# Patient Record
Sex: Male | Born: 1955 | Race: White | Hispanic: No | Marital: Married | Smoking: Current some day smoker
Health system: Southern US, Community
[De-identification: ages and names within clinical notes are randomized; demographics above are authoritative.]

## PROBLEM LIST (undated history)

## (undated) DIAGNOSIS — G8929 Other chronic pain: Secondary | ICD-10-CM

## (undated) DIAGNOSIS — E119 Type 2 diabetes mellitus without complications: Secondary | ICD-10-CM

## (undated) DIAGNOSIS — R0602 Shortness of breath: Secondary | ICD-10-CM

## (undated) DIAGNOSIS — M549 Dorsalgia, unspecified: Secondary | ICD-10-CM

## (undated) DIAGNOSIS — E785 Hyperlipidemia, unspecified: Secondary | ICD-10-CM

## (undated) DIAGNOSIS — Z86718 Personal history of other venous thrombosis and embolism: Secondary | ICD-10-CM

## (undated) DIAGNOSIS — M1990 Unspecified osteoarthritis, unspecified site: Secondary | ICD-10-CM

## (undated) DIAGNOSIS — R7611 Nonspecific reaction to tuberculin skin test without active tuberculosis: Secondary | ICD-10-CM

## (undated) DIAGNOSIS — I809 Phlebitis and thrombophlebitis of unspecified site: Secondary | ICD-10-CM

## (undated) DIAGNOSIS — I739 Peripheral vascular disease, unspecified: Secondary | ICD-10-CM

## (undated) DIAGNOSIS — G709 Myoneural disorder, unspecified: Secondary | ICD-10-CM

## (undated) DIAGNOSIS — J42 Unspecified chronic bronchitis: Secondary | ICD-10-CM

## (undated) DIAGNOSIS — I10 Essential (primary) hypertension: Secondary | ICD-10-CM

## (undated) DIAGNOSIS — M7580 Other shoulder lesions, unspecified shoulder: Secondary | ICD-10-CM

## (undated) DIAGNOSIS — J449 Chronic obstructive pulmonary disease, unspecified: Secondary | ICD-10-CM

## (undated) DIAGNOSIS — M489 Spondylopathy, unspecified: Secondary | ICD-10-CM

## (undated) DIAGNOSIS — J984 Other disorders of lung: Secondary | ICD-10-CM

## (undated) DIAGNOSIS — Z8739 Personal history of other diseases of the musculoskeletal system and connective tissue: Secondary | ICD-10-CM

## (undated) DIAGNOSIS — J302 Other seasonal allergic rhinitis: Secondary | ICD-10-CM

## (undated) DIAGNOSIS — K219 Gastro-esophageal reflux disease without esophagitis: Secondary | ICD-10-CM

## (undated) DIAGNOSIS — I2510 Atherosclerotic heart disease of native coronary artery without angina pectoris: Secondary | ICD-10-CM

## (undated) DIAGNOSIS — M539 Dorsopathy, unspecified: Secondary | ICD-10-CM

## (undated) HISTORY — PX: ANTERIOR CERVICAL DECOMP/DISCECTOMY FUSION: SHX1161

## (undated) HISTORY — PX: EXCISIONAL HEMORRHOIDECTOMY: SHX1541

## (undated) HISTORY — PX: BACK SURGERY: SHX140

## (undated) HISTORY — PX: FEMORAL BYPASS: SHX50

## (undated) HISTORY — DX: Phlebitis and thrombophlebitis of unspecified site: I80.9

## (undated) HISTORY — DX: Other shoulder lesions, unspecified shoulder: M75.80

## (undated) HISTORY — DX: Nonspecific reaction to tuberculin skin test without active tuberculosis: R76.11

---

## 2004-03-18 HISTORY — PX: POSTERIOR LAMINECTOMY / DECOMPRESSION LUMBAR SPINE: SUR740

## 2008-03-18 HISTORY — PX: IVC FILTER PLACEMENT (ARMC HX): HXRAD1551

## 2011-11-20 ENCOUNTER — Ambulatory Visit (INDEPENDENT_AMBULATORY_CARE_PROVIDER_SITE_OTHER): Payer: Medicare Other | Admitting: Family Medicine

## 2011-11-20 VITALS — BP 144/79 | HR 75 | Temp 98.0°F | Resp 16 | Ht 69.0 in | Wt 167.0 lb

## 2011-11-20 DIAGNOSIS — M47812 Spondylosis without myelopathy or radiculopathy, cervical region: Secondary | ICD-10-CM

## 2011-11-20 DIAGNOSIS — I10 Essential (primary) hypertension: Secondary | ICD-10-CM

## 2011-11-20 DIAGNOSIS — R079 Chest pain, unspecified: Secondary | ICD-10-CM

## 2011-11-20 DIAGNOSIS — I739 Peripheral vascular disease, unspecified: Secondary | ICD-10-CM

## 2011-11-20 DIAGNOSIS — M549 Dorsalgia, unspecified: Secondary | ICD-10-CM

## 2011-11-20 DIAGNOSIS — R072 Precordial pain: Secondary | ICD-10-CM

## 2011-11-20 DIAGNOSIS — M47816 Spondylosis without myelopathy or radiculopathy, lumbar region: Secondary | ICD-10-CM | POA: Insufficient documentation

## 2011-11-20 DIAGNOSIS — R739 Hyperglycemia, unspecified: Secondary | ICD-10-CM

## 2011-11-20 DIAGNOSIS — J449 Chronic obstructive pulmonary disease, unspecified: Secondary | ICD-10-CM

## 2011-11-20 DIAGNOSIS — M542 Cervicalgia: Secondary | ICD-10-CM

## 2011-11-20 LAB — COMPREHENSIVE METABOLIC PANEL
Alkaline Phosphatase: 72 U/L (ref 39–117)
BUN: 14 mg/dL (ref 6–23)
CO2: 30 mEq/L (ref 19–32)
Creat: 1.08 mg/dL (ref 0.50–1.35)
Glucose, Bld: 71 mg/dL (ref 70–99)
Total Bilirubin: 0.3 mg/dL (ref 0.3–1.2)
Total Protein: 7.1 g/dL (ref 6.0–8.3)

## 2011-11-20 MED ORDER — TRAMADOL HCL 50 MG PO TABS: ORAL_TABLET | ORAL | Status: DC

## 2011-11-20 MED ORDER — AMLODIPINE BESYLATE 5 MG PO TABS: 5.0000 mg | ORAL_TABLET | Freq: Every day | ORAL | Status: DC

## 2011-11-20 NOTE — Patient Instructions (Addendum)
STOP SMOKING!!!  Try to continue exercise  Take blood pressure medicine each morning  Begin aspirin 81mg  one each morning for mild blood thinner to help circulation.  Return in 1 month for recheck.  Take tylenol for milder pain.  If you take one tylenol when you take one tramadol it helps become a more potent pain reliever.

## 2011-11-20 NOTE — Progress Notes (Signed)
Subjective: 56 year old man who is here for the first time. He came in for high blood pressure. However his primary complaint is of chronic pain and spasms in his back. He has pain and numbness in his left large toe. He has had previous cervical and lumbar disease with discs and spurs. He gets spasms in his back. He's been on her blood pressure medications in the past, has been off for over a year. He went to a church clinic this morning and his blood pressure was quite high, as was his blood sugar at 127 fasting. He smokes about one half pack cigarettes per day, having smoked 2 packs a day in the past. He is on disability because of his health issues. He has headaches several times a week. He gets some dizziness. He is chest tightness at times. He has had bilateral femoral artery grafting done.  Objective: 56 year old man in obvious pain when he tries to move around. Throat clear. Neck supple without nodes. No carotid bruits. Chest has wheezing coarsely bilaterally. Heart regular without murmurs. Abdomen soft without mass or tenderness. No femoral bruits heard. Extremities are slightly dusky. Right PT pulses palpable DP has not. left has good dorsalis pedis pulse.  Assessment: Hypertension Cervical disc disease and chronic pain Lumbar disc disease and spurring and chronic pain COPD and wheezing Peripheral vascular disease Tobacco abuse Hx hyperglycemia (today A1C good so this is not a problem)  Plan: Will treat the hypertension. Urged him to quit smoking. He needs to make up his mind that he is going to live fine without cigarettes. Check labs and EKG on him. Tramadol for back pain  EKG normal  Results for orders placed in visit on 11/20/11  POCT GLYCOSYLATED HEMOGLOBIN (HGB A1C)      Component Value Range   Hemoglobin A1C 5.1

## 2011-11-22 ENCOUNTER — Encounter: Payer: Self-pay | Admitting: Family Medicine

## 2011-11-23 ENCOUNTER — Encounter: Payer: Self-pay | Admitting: Radiology

## 2012-01-21 ENCOUNTER — Other Ambulatory Visit: Payer: Self-pay | Admitting: Family Medicine

## 2012-03-13 ENCOUNTER — Other Ambulatory Visit: Payer: Self-pay | Admitting: Physician Assistant

## 2012-03-18 HISTORY — PX: NECK SURGERY: SHX720

## 2012-08-26 ENCOUNTER — Emergency Department (HOSPITAL_COMMUNITY)
Admission: EM | Admit: 2012-08-26 | Discharge: 2012-08-26 | Disposition: A | Discharge status: Home / Self Care | Payer: Medicare Other | Attending: Emergency Medicine | Admitting: Emergency Medicine

## 2012-08-26 ENCOUNTER — Emergency Department (HOSPITAL_COMMUNITY): Payer: Medicare Other

## 2012-08-26 ENCOUNTER — Encounter (HOSPITAL_COMMUNITY): Payer: Self-pay

## 2012-08-26 ENCOUNTER — Other Ambulatory Visit: Payer: Self-pay

## 2012-08-26 DIAGNOSIS — R911 Solitary pulmonary nodule: Secondary | ICD-10-CM | POA: Insufficient documentation

## 2012-08-26 DIAGNOSIS — R918 Other nonspecific abnormal finding of lung field: Secondary | ICD-10-CM

## 2012-08-26 DIAGNOSIS — Z79899 Other long term (current) drug therapy: Secondary | ICD-10-CM | POA: Insufficient documentation

## 2012-08-26 DIAGNOSIS — R259 Unspecified abnormal involuntary movements: Secondary | ICD-10-CM | POA: Insufficient documentation

## 2012-08-26 DIAGNOSIS — M79609 Pain in unspecified limb: Secondary | ICD-10-CM | POA: Insufficient documentation

## 2012-08-26 DIAGNOSIS — J449 Chronic obstructive pulmonary disease, unspecified: Secondary | ICD-10-CM | POA: Insufficient documentation

## 2012-08-26 DIAGNOSIS — Z8739 Personal history of other diseases of the musculoskeletal system and connective tissue: Secondary | ICD-10-CM | POA: Insufficient documentation

## 2012-08-26 DIAGNOSIS — M549 Dorsalgia, unspecified: Secondary | ICD-10-CM | POA: Insufficient documentation

## 2012-08-26 DIAGNOSIS — I10 Essential (primary) hypertension: Secondary | ICD-10-CM | POA: Insufficient documentation

## 2012-08-26 DIAGNOSIS — M5412 Radiculopathy, cervical region: Secondary | ICD-10-CM | POA: Insufficient documentation

## 2012-08-26 DIAGNOSIS — R209 Unspecified disturbances of skin sensation: Secondary | ICD-10-CM | POA: Insufficient documentation

## 2012-08-26 DIAGNOSIS — IMO0001 Reserved for inherently not codable concepts without codable children: Secondary | ICD-10-CM | POA: Insufficient documentation

## 2012-08-26 DIAGNOSIS — R112 Nausea with vomiting, unspecified: Secondary | ICD-10-CM | POA: Insufficient documentation

## 2012-08-26 DIAGNOSIS — F17200 Nicotine dependence, unspecified, uncomplicated: Secondary | ICD-10-CM | POA: Insufficient documentation

## 2012-08-26 DIAGNOSIS — J4489 Other specified chronic obstructive pulmonary disease: Secondary | ICD-10-CM | POA: Insufficient documentation

## 2012-08-26 DIAGNOSIS — R0602 Shortness of breath: Secondary | ICD-10-CM | POA: Insufficient documentation

## 2012-08-26 DIAGNOSIS — R61 Generalized hyperhidrosis: Secondary | ICD-10-CM | POA: Insufficient documentation

## 2012-08-26 HISTORY — DX: Dorsopathy, unspecified: M53.9

## 2012-08-26 HISTORY — DX: Spondylopathy, unspecified: M48.9

## 2012-08-26 HISTORY — DX: Chronic obstructive pulmonary disease, unspecified: J44.9

## 2012-08-26 HISTORY — DX: Essential (primary) hypertension: I10

## 2012-08-26 LAB — CBC WITH DIFFERENTIAL/PLATELET
Basophils Absolute: 0.1 10*3/uL (ref 0.0–0.1)
Eosinophils Relative: 2 % (ref 0–5)
Lymphocytes Relative: 29 % (ref 12–46)
Lymphs Abs: 3.7 10*3/uL (ref 0.7–4.0)
Monocytes Relative: 10 % (ref 3–12)
Neutrophils Relative %: 58 % (ref 43–77)
Platelets: 308 10*3/uL (ref 150–400)
RBC: 4.72 MIL/uL (ref 4.22–5.81)
RDW: 13.3 % (ref 11.5–15.5)
WBC: 12.7 10*3/uL — ABNORMAL HIGH (ref 4.0–10.5)

## 2012-08-26 LAB — COMPREHENSIVE METABOLIC PANEL
ALT: 21 U/L (ref 0–53)
AST: 19 U/L (ref 0–37)
Albumin: 4 g/dL (ref 3.5–5.2)
Chloride: 97 mEq/L (ref 96–112)
Creatinine, Ser: 0.74 mg/dL (ref 0.50–1.35)
Potassium: 3.4 mEq/L — ABNORMAL LOW (ref 3.5–5.1)
Sodium: 134 mEq/L — ABNORMAL LOW (ref 135–145)
Total Bilirubin: 0.3 mg/dL (ref 0.3–1.2)

## 2012-08-26 LAB — D-DIMER, QUANTITATIVE: D-Dimer, Quant: 3.32 ug/mL-FEU — ABNORMAL HIGH (ref 0.00–0.48)

## 2012-08-26 LAB — POCT I-STAT TROPONIN I: Troponin i, poc: 0 ng/mL (ref 0.00–0.08)

## 2012-08-26 MED ORDER — MORPHINE SULFATE 4 MG/ML IJ SOLN
4.0000 mg | Freq: Once | INTRAMUSCULAR | Status: AC
  Administered 2012-08-26: 4 mg via INTRAVENOUS
  Filled 2012-08-26: qty 1

## 2012-08-26 MED ORDER — METHOCARBAMOL 100 MG/ML IJ SOLN
1000.0000 mg | Freq: Once | INTRAMUSCULAR | Status: AC
  Administered 2012-08-26: 1000 mg via INTRAMUSCULAR
  Filled 2012-08-26: qty 10

## 2012-08-26 MED ORDER — IOHEXOL 350 MG/ML SOLN
100.0000 mL | Freq: Once | INTRAVENOUS | Status: AC | PRN
  Administered 2012-08-26: 100 mL via INTRAVENOUS

## 2012-08-26 MED ORDER — DIPHENHYDRAMINE HCL 50 MG/ML IJ SOLN
50.0000 mg | Freq: Once | INTRAMUSCULAR | Status: AC
  Administered 2012-08-26: 50 mg via INTRAVENOUS
  Filled 2012-08-26: qty 1

## 2012-08-26 MED ORDER — SODIUM CHLORIDE 0.9 % IV BOLUS (SEPSIS)
1000.0000 mL | Freq: Once | INTRAVENOUS | Status: AC
  Administered 2012-08-26: 1000 mL via INTRAVENOUS

## 2012-08-26 MED ORDER — HYDROCODONE-ACETAMINOPHEN 5-325 MG PO TABS: 2.0000 | ORAL_TABLET | ORAL | Status: DC | PRN

## 2012-08-26 MED ORDER — METHOCARBAMOL 500 MG PO TABS: 500.0000 mg | ORAL_TABLET | Freq: Two times a day (BID) | ORAL | Status: DC

## 2012-08-26 MED ORDER — ONDANSETRON HCL 4 MG/2ML IJ SOLN
4.0000 mg | Freq: Once | INTRAMUSCULAR | Status: AC
  Administered 2012-08-26: 4 mg via INTRAVENOUS
  Filled 2012-08-26: qty 2

## 2012-08-26 NOTE — ED Notes (Signed)
Patient transported to X-ray 

## 2012-08-26 NOTE — ED Notes (Signed)
Pt c/o rt arm/shoulder/mid back x1hr with nausea/diaphoretic. Pt states hx of HTN, hasn't had meds x4days. Pt c/o numbness to rt arm/hand/fingers

## 2012-08-26 NOTE — ED Provider Notes (Signed)
History     CSN: [id number]  Arrival date & time 08/26/12  0854   First MD Initiated Contact with Patient 08/26/12 [phone]      Chief Complaint  Patient presents with  . Shoulder Pain  . Arm Pain    (Consider location/radiation/quality/duration/timing/severity/associated sxs/prior treatment) HPI Pt with acute onset R thoracic back pain radiating down R arm with paresthesias in R hand. +SOB, nausea, diaphoresis and tremor. No previously similar episodes of pain. No lower ext swelling or pain. No recent surgeries or extended travel. Pt with history of cervical radiculopathy. Past Medical History  Diagnosis Date  . Hypertension   . COPD (chronic obstructive pulmonary disease)   . Spinal disease     Past Surgical History  Procedure Laterality Date  . Arterial bypass surgry    . Cervical spine surgery    . Back surgery      History reviewed. No pertinent family history.  History  Substance Use Topics  . Smoking status: Current Every Day Smoker -- 0.50 packs/day    Types: Cigarettes  . Smokeless tobacco: Not on file  . Alcohol Use: No      Review of Systems  Constitutional: Negative for fever and chills.  HENT: Negative for neck pain and neck stiffness.   Respiratory: Positive for shortness of breath. Negative for chest tightness.   Cardiovascular: Negative for chest pain, palpitations and leg swelling.  Gastrointestinal: Positive for nausea. Negative for vomiting, abdominal pain, diarrhea and constipation.  Genitourinary: Negative for dysuria.  Musculoskeletal: Positive for myalgias and back pain. Negative for arthralgias.  Skin: Negative for pallor, rash and wound.  Neurological: Positive for tremors. Negative for dizziness, syncope, weakness, light-headedness, numbness and headaches.  All other systems reviewed and are negative.    Allergies  Contrast media and Ibuprofen  Home Medications   Current Outpatient Rx  Name  Route  Sig  Dispense  Refill  .  amLODipine (NORVASC) 10 MG tablet   Oral   Take 10 mg by mouth daily.         . baclofen (LIORESAL) 10 MG tablet   Oral   Take 10 mg by mouth 2 (two) times daily.         . cloNIDine (CATAPRES) 0.2 MG tablet   Oral   Take 0.2 mg by mouth 2 (two) times daily.         . hydrochlorothiazide (HYDRODIURIL) 25 MG tablet   Oral   Take 12.5 mg by mouth daily.         [name] lisinopril (PRINIVIL,ZESTRIL) 40 MG tablet   Oral   Take 40 mg by mouth daily.         . methocarbamol (ROBAXIN) 750 MG tablet   Oral   Take 1,500 mg by mouth 2 (two) times daily.         [name] HYDROcodone-acetaminophen (NORCO) 5-325 MG per tablet   Oral   Take 2 tablets by mouth every 4 (four) hours as needed for pain.   10 tablet   0   . methocarbamol (ROBAXIN) 500 MG tablet   Oral   Take 1 tablet (500 mg total) by mouth 2 (two) times daily.   20 tablet   0     BP 155/80  Pulse 73  Temp(Src) 98.5 F (36.9 C)  Resp 17  Ht 5\' 9"  (1.753 m)  Wt 187 lb (84.823 kg)  BMI 27.6 kg/m2  SpO2 98%  Physical Exam  Nursing note and vitals reviewed. Constitutional: He  is oriented to person, place, and time. He appears well-developed and well-nourished. He appears distressed.  HENT:  Head: Normocephalic and atraumatic.  Mouth/Throat: Oropharynx is clear and moist.  Eyes: EOM are normal. Pupils are equal, round, and reactive to light.  Neck: Normal range of motion. Neck supple.  No midline TTP  Cardiovascular: Normal rate and regular rhythm.   Pulmonary/Chest: Effort normal and breath sounds normal. No respiratory distress. He has no wheezes. He has no rales.  Abdominal: Soft. Bowel sounds are normal. He exhibits no distension. There is no tenderness. There is no rebound and no guarding.  Musculoskeletal: Normal range of motion. He exhibits tenderness. He exhibits no edema.  TTP over R scapula, no deformity or swelling. 2+ bl radial pulses. No calf swelling or tenderness    Neurological: He is alert and  oriented to person, place, and time.  Anxious appearing. Moves all ext without deficit, sensation intact  Skin: Skin is warm. No rash noted. He is diaphoretic. No erythema.    ED Course  Procedures (including critical care time)  Labs Reviewed  CBC WITH DIFFERENTIAL - Abnormal; Notable for the following:    WBC 12.7 (*)    MCHC 36.2 (*)    Monocytes Absolute 1.3 (*)    All other components within normal limits  COMPREHENSIVE METABOLIC PANEL - Abnormal; Notable for the following:    Sodium 134 (*)    Potassium 3.4 (*)    Glucose, Bld 127 (*)    All other components within normal limits  D-DIMER, QUANTITATIVE - Abnormal; Notable for the following:    D-Dimer, Quant 3.32 (*)    All other components within normal limits  TROPONIN I  POCT I-STAT TROPONIN I   Dg Chest 2 View  08/26/2012   *RADIOLOGY REPORT*  Clinical Data: Shoulder and arm pain  CHEST - 2 VIEW  Comparison: None  Findings: The heart size and mediastinal contours are within normal limits.  Both lungs are clear.  The visualized skeletal structures are unremarkable.  IMPRESSION: Negative examination.   Original Report Authenticated By: Signa Kell, M.D.   Ct Angio Chest Pe W/cm &/or Wo Cm  08/26/2012   *RADIOLOGY REPORT*  Clinical Data: Right scapular pain radiating down the right arm with numbness.  CT ANGIOGRAPHY CHEST  Technique:  Multidetector CT imaging of the chest using the standard protocol during bolus administration of intravenous contrast. Multiplanar reconstructed images including MIPs were obtained and reviewed to evaluate the vascular anatomy.  Contrast: OMNIPAQUE IOHEXOL 350 MG/ML SOLN  Comparison: None.  Findings: Negative for pulmonary embolus.  No pathologically enlarged mediastinal, hilar or axillary lymph nodes. Atherosclerotic calcification of the arterial vasculature, including coronary arteries.  Heart size normal.  No pericardial effusion.  Pre pericardiac lymph node is small in size.  Mild  paraseptal emphysema.  A few scattered pulmonary nodules measure up to 4 mm (3 x 5 mm) in the right lower lobe (image 66). Minimal dependent atelectasis bilaterally.  No pleural fluid. Airway is unremarkable.  Incidental imaging of the upper abdomen shows no acute findings. No worrisome lytic or sclerotic lesions.  Degenerative changes are seen in the spine.  IMPRESSION:  1.  Negative for pulmonary embolus. 2.  Coronary artery calcification. 3.  Scattered tiny pulmonary nodules are nonspecific. Given risk factors for bronchogenic carcinoma, follow-up chest CT at 1 year is recommended.  This recommendation follows the consensus statement: Guidelines for Management of Small Pulmonary Nodules Detected on CT Scans:  A Statement from the  Fleischner Society as published in Radiology 2005; [id number]. 4.  Per patient report, he experienced itching but no hives or respiratory difficulty 1 week after IV contrast injection into 2103.  Today, 50 mg of Benadryl was administered prior to imaging without adverse event.   Original Report Authenticated By: Leanna Battles, M.D.     1. Cervical radiculopathy   2. Pulmonary nodules      Date: 08/26/2012  Rate: 97  Rhythm: normal sinus rhythm  QRS Axis: normal  Intervals: normal  ST/T Wave abnormalities: normal  Conduction Disutrbances:none  Narrative Interpretation:   Old EKG Reviewed: none available    MDM   Pt is now more comfortable after meds. Stated he has had CT contrast in past and that he reacted by developing itching 1 week after injection. Denies rash, facial swelling, air compromise. Discussed with radiology and will pre-treat with Benadryl prior to CTA chest to r/o PE.   CT neg for PE. Pt informed of nodules and need to f/u. Pt still c/o on going paresthesias in hand and admit to heavy lifting yesterday that may have exacerbated current symptoms. Will d/c home with spine and PMD referral.     Loren Racer, MD 08/26/12 1239

## 2012-08-26 NOTE — ED Notes (Signed)
Pt escorted to discharge window. Verbalized understanding discharge instructions. In no acute distress.   

## 2012-08-26 NOTE — ED Notes (Signed)
Patient transported to CT 

## 2012-09-01 ENCOUNTER — Encounter: Payer: Self-pay | Admitting: Internal Medicine

## 2012-09-01 ENCOUNTER — Ambulatory Visit (INDEPENDENT_AMBULATORY_CARE_PROVIDER_SITE_OTHER)
Admission: RE | Admit: 2012-09-01 | Discharge: 2012-09-01 | Disposition: A | Discharge status: Home / Self Care | Payer: Medicare Other | Source: Ambulatory Visit | Attending: Internal Medicine | Admitting: Internal Medicine

## 2012-09-01 ENCOUNTER — Ambulatory Visit (INDEPENDENT_AMBULATORY_CARE_PROVIDER_SITE_OTHER): Payer: Medicare Other | Admitting: Internal Medicine

## 2012-09-01 ENCOUNTER — Other Ambulatory Visit: Payer: Self-pay | Admitting: Internal Medicine

## 2012-09-01 VITALS — BP 164/120 | HR 80 | Temp 98.2°F | Ht 69.0 in | Wt 194.0 lb

## 2012-09-01 DIAGNOSIS — J449 Chronic obstructive pulmonary disease, unspecified: Secondary | ICD-10-CM

## 2012-09-01 DIAGNOSIS — M792 Neuralgia and neuritis, unspecified: Secondary | ICD-10-CM

## 2012-09-01 DIAGNOSIS — M79609 Pain in unspecified limb: Secondary | ICD-10-CM

## 2012-09-01 DIAGNOSIS — M79601 Pain in right arm: Secondary | ICD-10-CM

## 2012-09-01 DIAGNOSIS — IMO0002 Reserved for concepts with insufficient information to code with codable children: Secondary | ICD-10-CM

## 2012-09-01 DIAGNOSIS — Z23 Encounter for immunization: Secondary | ICD-10-CM

## 2012-09-01 DIAGNOSIS — I10 Essential (primary) hypertension: Secondary | ICD-10-CM

## 2012-09-01 DIAGNOSIS — J4489 Other specified chronic obstructive pulmonary disease: Secondary | ICD-10-CM

## 2012-09-01 MED ORDER — HYDROCODONE-ACETAMINOPHEN 5-325 MG PO TABS: 2.0000 | ORAL_TABLET | ORAL | Status: DC | PRN

## 2012-09-01 MED ORDER — LISINOPRIL 40 MG PO TABS: 40.0000 mg | ORAL_TABLET | Freq: Every day | ORAL | Status: DC

## 2012-09-01 MED ORDER — HYDROCHLOROTHIAZIDE 25 MG PO TABS: 12.5000 mg | ORAL_TABLET | Freq: Every day | ORAL | Status: DC

## 2012-09-01 NOTE — Assessment & Plan Note (Signed)
Encouraged pt to stop smoking eRx for [facility] given today

## 2012-09-01 NOTE — Assessment & Plan Note (Signed)
Uncontrolled Will restart HCTZ and lisinopril RTC in 1 week for f/u

## 2012-09-01 NOTE — Progress Notes (Signed)
HPI  Pt presents to the clinic today to establish care. He has not had a PCP in 2 years since moving from [place]. He does have some concerns today of right arm pain and numbness. This started 1 week ago. He did go to the ER for the same. They gave him a muscle relaxer and pain medicine. He reports that it has not gotten much better. He has not had a specific injury to the neck that he can relate this to but he has had cervical fusion x 2. He would like to figure out what is going on as this is interfering with the use of his arm.   Flu: never Tetanus: more than 10 years Dentist: no (dentures) Eye doctor: as needed Colonoscopy: 2011 (10 years)  Past Medical History  Diagnosis Date  . Hypertension   . COPD (chronic obstructive pulmonary disease)   . Spinal disease   . Allergy   . Positive TB test   . Phlebitis     Current Outpatient Prescriptions  Medication Sig Dispense Refill  . methocarbamol (ROBAXIN) 500 MG tablet Take 1 tablet (500 mg total) by mouth 2 (two) times daily.  20 tablet  0  . amLODipine (NORVASC) 10 MG tablet Take 10 mg by mouth daily.      . baclofen (LIORESAL) 10 MG tablet Take 10 mg by mouth 2 (two) times daily.      . cloNIDine (CATAPRES) 0.2 MG tablet Take 0.2 mg by mouth 2 (two) times daily.      . hydrochlorothiazide (HYDRODIURIL) 25 MG tablet Take 12.5 mg by mouth daily.      [name] HYDROcodone-acetaminophen (NORCO) 5-325 MG per tablet Take 2 tablets by mouth every 4 (four) hours as needed for pain.  10 tablet  0  . lisinopril (PRINIVIL,ZESTRIL) 40 MG tablet Take 40 mg by mouth daily.      . methocarbamol (ROBAXIN) 750 MG tablet Take 1,500 mg by mouth 2 (two) times daily.       No current facility-administered medications for this visit.    Allergies  Allergen Reactions  . Contrast Media (Iodinated Diagnostic Agents) Hives and Itching  . Ibuprofen Other (See Comments)    Internal bleeding    Family History  Problem Relation Age of Onset  . Cancer Mother    . Hypertension Mother   . Hypertension Father   . Stroke Father   . Diabetes Neg Hx   . Early death Neg Hx     History   Social History  . Marital Status: Widowed    Spouse Name: N/A    Number of Children: N/A  . Years of Education: 7   Occupational History  . Disabled    Social History Main Topics  . Smoking status: Current Every Day Smoker -- 0.25 packs/day for 44 years    Types: Cigarettes  . Smokeless tobacco: Never Used  . Alcohol Use: No  . Drug Use: No  . Sexually Active: Yes   Other Topics Concern  . Not on file   Social History Narrative   Regular exercise-no   Caffeine Use-yes    ROS:  Constitutional: Denies fever, malaise, fatigue, headache or abrupt weight changes.  HEENT: Denies eye pain, eye redness, ear pain, ringing in the ears, wax buildup, runny nose, nasal congestion, bloody nose, or sore throat. Respiratory: Denies difficulty breathing, shortness of breath, cough or sputum production.   Cardiovascular: Denies chest pain, chest tightness, palpitations or swelling in the hands or feet.  Gastrointestinal: Denies abdominal pain, bloating, constipation, diarrhea or blood in the stool.  GU: Denies frequency, urgency, pain with urination, blood in urine, odor or discharge. Musculoskeletal: Pt reports right arm pain. Denies decrease in range of motion, difficulty with gait, muscle pain or joint pain and swelling.  Skin: Denies redness, rashes, lesions or ulcercations.  Neurological: Pt reports right arm numbness. Denies dizziness, difficulty with memory, difficulty with speech or problems with balance and coordination.   No other specific complaints in a complete review of systems (except as listed in HPI above).  PE:  BP 164/120  Pulse 80  Temp(Src) 98.2 F (36.8 C) (Oral)  Ht 5\' 9"  (1.753 m)  Wt 194 lb (87.998 kg)  BMI 28.64 kg/m2  SpO2 99% Wt Readings from Last 3 Encounters:  09/01/12 194 lb (87.998 kg)  08/26/12 187 lb (84.823 kg)   11/20/11 167 lb (75.751 kg)    General: Appears his stated age, well developed, well nourished in NAD. HEENT: Head: normal shape and size; Eyes: sclera white, no icterus, conjunctiva pink, PERRLA and EOMs intact; Ears: Tm's gray and intact, normal light reflex; Nose: mucosa pink and moist, septum midline; Throat/Mouth: Teeth present, mucosa pink and moist, no lesions or ulcerations noted.  Neck: Decreased range of motion. Neck supple, trachea midline. No massses, lumps or thyromegaly present.  Cardiovascular: Normal rate and rhythm. S1,S2 noted.  No murmur, rubs or gallops noted. No JVD or BLE edema. No carotid bruits noted. Pulmonary/Chest: Normal effort and positive vesicular breath sounds. No respiratory distress. No wheezes, rales or ronchi noted.  Abdomen: Soft and nontender. Normal bowel sounds, no bruits noted. No distention or masses noted. Liver, spleen and kidneys non palpable. Musculoskeletal: Normal range of motion. No signs of joint swelling. No difficulty with gait.  Neurological: Alert and oriented. Cranial nerves II-XII intact. Coordination normal. +DTRs bilaterally. Psychiatric: Mood and affect normal. Behavior is normal. Judgment and thought content normal.     BMET    Component Value Date/Time   NA 134* 08/26/2012 0855   K 3.4* 08/26/2012 0855   CL 97 08/26/2012 0855   CO2 21 08/26/2012 0855   GLUCOSE 127* 08/26/2012 0855   BUN 10 08/26/2012 0855   CREATININE 0.74 08/26/2012 0855   CREATININE 1.08 11/20/2011 1514   CALCIUM 9.8 08/26/2012 0855   GFRNONAA >90 08/26/2012 0855   GFRAA >90 08/26/2012 0855    Lipid Panel  No results found for this basename: chol, trig, hdl, cholhdl, vldl, ldlcalc    CBC    Component Value Date/Time   WBC 12.7* 08/26/2012 0855   RBC 4.72 08/26/2012 0855   HGB 14.8 08/26/2012 0855   HCT 40.9 08/26/2012 0855   PLT 308 08/26/2012 0855   MCV 86.7 08/26/2012 0855   MCH 31.4 08/26/2012 0855   MCHC 36.2* 08/26/2012 0855   RDW 13.3 08/26/2012 0855    LYMPHSABS 3.7 08/26/2012 0855   MONOABS 1.3* 08/26/2012 0855   EOSABS 0.3 08/26/2012 0855   BASOSABS 0.1 08/26/2012 0855    Hgb A1C Lab Results  Component Value Date   HGBA1C 5.1 11/20/2011     Assessment and Plan:  Preventative Health:  Smoking cessation counseling given, education materials provided Tdap given today Encouraged pt to visit eye doctor yearly  Right arm pain with radicular symptoms, new onset:  Will obtain xray of cervical spine Will most  Likely need to repeat MRI Refilled Norco

## 2012-09-06 ENCOUNTER — Ambulatory Visit
Admission: RE | Admit: 2012-09-06 | Discharge: 2012-09-06 | Disposition: A | Discharge status: Home / Self Care | Payer: Medicare Other | Source: Ambulatory Visit | Attending: Internal Medicine | Admitting: Internal Medicine

## 2012-09-06 DIAGNOSIS — M792 Neuralgia and neuritis, unspecified: Secondary | ICD-10-CM

## 2012-09-07 ENCOUNTER — Other Ambulatory Visit: Payer: Self-pay | Admitting: Internal Medicine

## 2012-09-07 DIAGNOSIS — M5010 Cervical disc disorder with radiculopathy, unspecified cervical region: Secondary | ICD-10-CM

## 2012-09-08 ENCOUNTER — Ambulatory Visit (INDEPENDENT_AMBULATORY_CARE_PROVIDER_SITE_OTHER): Payer: Medicare Other | Admitting: Internal Medicine

## 2012-09-08 ENCOUNTER — Other Ambulatory Visit: Payer: Self-pay | Admitting: Internal Medicine

## 2012-09-08 ENCOUNTER — Encounter: Payer: Self-pay | Admitting: Internal Medicine

## 2012-09-08 VITALS — BP 142/90 | HR 84 | Temp 99.0°F | Ht 69.0 in | Wt 192.1 lb

## 2012-09-08 DIAGNOSIS — IMO0002 Reserved for concepts with insufficient information to code with codable children: Secondary | ICD-10-CM

## 2012-09-08 DIAGNOSIS — I10 Essential (primary) hypertension: Secondary | ICD-10-CM

## 2012-09-08 DIAGNOSIS — M5412 Radiculopathy, cervical region: Secondary | ICD-10-CM

## 2012-09-08 DIAGNOSIS — G4700 Insomnia, unspecified: Secondary | ICD-10-CM

## 2012-09-08 DIAGNOSIS — M5010 Cervical disc disorder with radiculopathy, unspecified cervical region: Secondary | ICD-10-CM | POA: Insufficient documentation

## 2012-09-08 MED ORDER — HYDROCODONE-ACETAMINOPHEN 10-325 MG PO TABS: 1.0000 | ORAL_TABLET | Freq: Three times a day (TID) | ORAL | Status: DC | PRN

## 2012-09-08 MED ORDER — ZOLPIDEM TARTRATE 5 MG PO TABS: 5.0000 mg | ORAL_TABLET | Freq: Every evening | ORAL | Status: DC | PRN

## 2012-09-08 NOTE — Assessment & Plan Note (Signed)
Elevated today secondary to pain Continue lisinopril and HCTZ Will recheck in 3  months

## 2012-09-08 NOTE — Progress Notes (Signed)
Subjective:    Patient ID: Joshua [name], male    DOB: 11-28-1955, 57 y.o.   MRN: [id number]  HPI  Pt presents to the clinic today for 1 week f/u for HTN. At his last visit, blood pressure was elevated. He was restarted on lisinopril and HCTZ. He does not monitor his BP at home. His blood pressure is elevated today because he is in some much pain from his right arm. MRI done at last visit revealed a bulging disc. A referral to neurosurgery had been placed. He is taking 4 Norco per day. Any movement of his neck causes pain to shoot in his right arm. Pt also c/o insomnia secondary to pain. He has had issues with insomnia in the past. He has not tried anything OTC. He has never tried sleep aids in the past.  Review of Systems      Past Medical History  Diagnosis Date  . Hypertension   . COPD (chronic obstructive pulmonary disease)   . Spinal disease   . Allergy   . Positive TB test   . Phlebitis     Current Outpatient Prescriptions  Medication Sig Dispense Refill  . amLODipine (NORVASC) 10 MG tablet Take 10 mg by mouth daily.      . baclofen (LIORESAL) 10 MG tablet Take 10 mg by mouth 2 (two) times daily.      . cloNIDine (CATAPRES) 0.2 MG tablet Take 0.2 mg by mouth 2 (two) times daily.      . hydrochlorothiazide (HYDRODIURIL) 25 MG tablet Take 0.5 tablets (12.5 mg total) by mouth daily.  30 tablet  0  . HYDROcodone-acetaminophen (NORCO) 5-325 MG per tablet Take 2 tablets by mouth every 4 (four) hours as needed for pain.  30 tablet  0  . lisinopril (PRINIVIL,ZESTRIL) 40 MG tablet Take 1 tablet (40 mg total) by mouth daily.  30 tablet  0  . methocarbamol (ROBAXIN) 500 MG tablet Take 1 tablet (500 mg total) by mouth 2 (two) times daily.  20 tablet  0  . methocarbamol (ROBAXIN) 750 MG tablet Take 1,500 mg by mouth 2 (two) times daily.       No current facility-administered medications for this visit.    Allergies  Allergen Reactions  . Contrast Media (Iodinated Diagnostic  Agents) Hives and Itching  . Ibuprofen Other (See Comments)    Internal bleeding    Family History  Problem Relation Age of Onset  . Cancer Mother   . Hypertension Mother   . Hypertension Father   . Stroke Father   . Diabetes Neg Hx   . Early death Neg Hx     History   Social History  . Marital Status: Widowed    Spouse Name: N/A    Number of Children: N/A  . Years of Education: 7   Occupational History  . Disabled    Social History Main Topics  . Smoking status: Current Every Day Smoker -- 0.25 packs/day for 44 years    Types: Cigarettes  . Smokeless tobacco: Never Used  . Alcohol Use: No  . Drug Use: No  . Sexually Active: Yes   Other Topics Concern  . Not on file   Social History Narrative   Regular exercise-no   Caffeine Use-yes     Constitutional: Denies fever, malaise, fatigue, headache or abrupt weight changes.  Respiratory: Denies difficulty breathing, shortness of breath, cough or sputum production.   Cardiovascular: Denies chest pain, chest tightness, palpitations or swelling in the hands  or feet.  Musculoskeletal: Pt reports right arm pain. Denies decrease in range of motion, difficulty with gait, muscle pain or joint pain and swelling.  Neurological: Pt reports insomnia. Denies dizziness, difficulty with memory, difficulty with speech or problems with balance and coordination.   No other specific complaints in a complete review of systems (except as listed in HPI above).  Objective:   Physical Exam   BP 142/90  Pulse 84  Temp(Src) 99 F (37.2 C) (Oral)  Ht 5\' 9"  (1.753 m)  Wt 192 lb 2 oz (87.147 kg)  BMI 28.36 kg/m2  SpO2 98% Wt Readings from Last 3 Encounters:  09/08/12 192 lb 2 oz (87.147 kg)  09/01/12 194 lb (87.998 kg)  08/26/12 187 lb (84.823 kg)    General: Appears his stated age, well developed, well nourished in NAD.[name]  Neck: Normal range of motion. Neck supple, trachea midline. No massses, lumps or thyromegaly present.   Cardiovascular: Normal rate and rhythm. S1,S2 noted.  No murmur, rubs or gallops noted. No JVD or BLE edema. No carotid bruits noted. Pulmonary/Chest: Normal effort and positive vesicular breath sounds. No respiratory distress. No wheezes, rales or ronchi noted.  Musculoskeletal: Decrease internal and exteranl range of motion of the right shoulder secondary to pain. No signs of joint swelling. No difficulty with gait.  Neurological: Alert and oriented. Cranial nerves II-XII intact. Coordination normal. +DTRs bilaterally.   BMET    Component Value Date/Time   NA 134* 08/26/2012 0855   K 3.4* 08/26/2012 0855   CL 97 08/26/2012 0855   CO2 21 08/26/2012 0855   GLUCOSE 127* 08/26/2012 0855   BUN 10 08/26/2012 0855   CREATININE 0.74 08/26/2012 0855   CREATININE 1.08 11/20/2011 1514   CALCIUM 9.8 08/26/2012 0855   GFRNONAA >90 08/26/2012 0855   GFRAA >90 08/26/2012 0855    Lipid Panel  No results found for this basename: chol, trig, hdl, cholhdl, vldl, ldlcalc    CBC    Component Value Date/Time   WBC 12.7* 08/26/2012 0855   RBC 4.72 08/26/2012 0855   HGB 14.8 08/26/2012 0855   HCT 40.9 08/26/2012 0855   PLT 308 08/26/2012 0855   MCV 86.7 08/26/2012 0855   MCH 31.4 08/26/2012 0855   MCHC 36.2* 08/26/2012 0855   RDW 13.3 08/26/2012 0855   LYMPHSABS 3.7 08/26/2012 0855   MONOABS 1.3* 08/26/2012 0855   EOSABS 0.3 08/26/2012 0855   BASOSABS 0.1 08/26/2012 0855    Hgb A1C Lab Results  Component Value Date   HGBA1C 5.1 11/20/2011        Assessment & Plan:

## 2012-09-08 NOTE — Patient Instructions (Signed)
Hypertension  As your heart beats, it forces blood through your arteries. This force is your blood pressure. If the pressure is too high, it is called hypertension (HTN) or high blood pressure. HTN is dangerous because you may have it and not know it. High blood pressure may mean that your heart has to work harder to pump blood. Your arteries may be narrow or stiff. The extra work puts you at risk for heart disease, stroke, and other problems.   Blood pressure consists of two numbers, a higher number over a lower, 110/72, for example. It is stated as "110 over 72." The ideal is below 120 for the top number (systolic) and under 80 for the bottom (diastolic). Write down your blood pressure today.  You should pay close attention to your blood pressure if you have certain conditions such as:   Heart failure.   Prior heart attack.   Diabetes   Chronic kidney disease.   Prior stroke.   Multiple risk factors for heart disease.  To see if you have HTN, your blood pressure should be measured while you are seated with your arm held at the level of the heart. It should be measured at least twice. A one-time elevated blood pressure reading (especially in the Emergency Department) does not mean that you need treatment. There may be conditions in which the blood pressure is different between your right and left arms. It is important to see your caregiver soon for a recheck.  Most people have essential hypertension which means that there is not a specific cause. This type of high blood pressure may be lowered by changing lifestyle factors such as:   Stress.   Smoking.   Lack of exercise.   Excessive weight.   Drug/tobacco/alcohol use.   Eating less salt.  Most people do not have symptoms from high blood pressure until it has caused damage to the body. Effective treatment can often prevent, delay or reduce that damage.  TREATMENT    When a cause has been identified, treatment for high blood pressure is directed at the cause. There are a large number of medications to treat HTN. These fall into several categories, and your caregiver will help you select the medicines that are best for you. Medications may have side effects. You should review side effects with your caregiver.  If your blood pressure stays high after you have made lifestyle changes or started on medicines,    Your medication(s) may need to be changed.   Other problems may need to be addressed.   Be certain you understand your prescriptions, and know how and when to take your medicine.   Be sure to follow up with your caregiver within the time frame advised (usually within two weeks) to have your blood pressure rechecked and to review your medications.   If you are taking more than one medicine to lower your blood pressure, make sure you know how and at what times they should be taken. Taking two medicines at the same time can result in blood pressure that is too low.  SEEK IMMEDIATE MEDICAL CARE IF:   You develop a severe headache, blurred or changing vision, or confusion.   You have unusual weakness or numbness, or a faint feeling.   You have severe chest or abdominal pain, vomiting, or breathing problems.  MAKE SURE YOU:    Understand these instructions.   Will watch your condition.   Will get help right away if you are not doing well   or get worse.  Document Released: 03/04/2005 Document Revised: 05/27/2011 Document Reviewed: 10/23/2007  ExitCare Patient Information 2014 [employer]  DASH Diet   The DASH diet stands for "Dietary Approaches to Stop Hypertension." It is a healthy eating plan that has been shown to reduce high blood pressure (hypertension) in as little as 14 days, while also possibly providing other significant health benefits. These other health benefits include reducing the risk of breast cancer after menopause and reducing the risk of type 2 diabetes, heart disease, colon cancer, and stroke. Health benefits also include weight loss and slowing kidney failure in patients with chronic kidney disease.   DIET GUIDELINES   Limit salt (sodium). Your diet should contain less than 1500 mg of sodium daily.   Limit refined or processed carbohydrates. Your diet should include mostly whole grains. Desserts and added sugars should be used sparingly.   Include small amounts of heart-healthy fats. These types of fats include nuts, oils, and tub margarine. Limit saturated and trans fats. These fats have been shown to be harmful in the body.  CHOOSING FOODS   The following food groups are based on a 2000 calorie diet. See your Registered Dietitian for individual calorie needs.  Grains and Grain Products (6 to 8 servings daily)   Eat More Often: Whole-wheat bread, brown rice, whole-grain or wheat pasta, quinoa, popcorn without added fat or salt (air popped).   Eat Less Often: White bread, white pasta, white rice, cornbread.  Vegetables (4 to 5 servings daily)   Eat More Often: Fresh, frozen, and canned vegetables. Vegetables may be raw, steamed, roasted, or grilled with a minimal amount of fat.   Eat Less Often/Avoid: Creamed or fried vegetables. Vegetables in a cheese sauce.  Fruit (4 to 5 servings daily)   Eat More Often: All fresh, canned (in natural juice), or frozen fruits. Dried fruits without added sugar. One hundred percent fruit juice ( cup [237 mL] daily).   Eat Less Often: Dried fruits with added sugar. Canned fruit in light or heavy syrup.   Lean Meats, Fish, and Poultry (2 servings or less daily. One serving is 3 to 4 oz [85-114 g]).   Eat More Often: Ninety percent or leaner ground beef, tenderloin, sirloin. Round cuts of beef, chicken breast, turkey breast. All fish. Grill, bake, or broil your meat. Nothing should be fried.   Eat Less Often/Avoid: Fatty cuts of meat, turkey, or chicken leg, thigh, or wing. Fried cuts of meat or fish.  Dairy (2 to 3 servings)   Eat More Often: Low-fat or fat-free milk, low-fat plain or light yogurt, reduced-fat or part-skim cheese.   Eat Less Often/Avoid: Milk (whole, 2%).Whole milk yogurt. Full-fat cheeses.  Nuts, Seeds, and Legumes (4 to 5 servings per week)   Eat More Often: All without added salt.   Eat Less Often/Avoid: Salted nuts and seeds, canned beans with added salt.  Fats and Sweets (limited)   Eat More Often: Vegetable oils, tub margarines without trans fats, sugar-free gelatin. Mayonnaise and salad dressings.   Eat Less Often/Avoid: Coconut oils, palm oils, butter, stick margarine, cream, half and half, cookies, candy, pie.  FOR MORE INFORMATION  The Dash Diet Eating Plan: www.dashdiet.org  Document Released: 02/21/2011 Document Revised: 05/27/2011 Document Reviewed: 02/21/2011  [employer] Patient Information 2014 [employer].

## 2012-09-08 NOTE — Assessment & Plan Note (Signed)
Increase Norco to 10-325 TID Referral to neurosurgery already placed Avoid strenuous activity.

## 2012-09-08 NOTE — Assessment & Plan Note (Signed)
Start ambien. 

## 2012-09-23 ENCOUNTER — Other Ambulatory Visit: Payer: Self-pay | Admitting: Neurosurgery

## 2012-09-25 ENCOUNTER — Encounter (HOSPITAL_COMMUNITY): Payer: Self-pay | Admitting: Pharmacy Technician

## 2012-09-28 ENCOUNTER — Other Ambulatory Visit: Payer: Self-pay | Admitting: Internal Medicine

## 2012-10-01 ENCOUNTER — Encounter (HOSPITAL_COMMUNITY)
Admission: RE | Admit: 2012-10-01 | Discharge: 2012-10-01 | Disposition: A | Discharge status: Home / Self Care | Payer: Medicare Other | Source: Ambulatory Visit | Attending: Neurosurgery | Admitting: Neurosurgery

## 2012-10-01 ENCOUNTER — Encounter (HOSPITAL_COMMUNITY): Payer: Self-pay

## 2012-10-01 HISTORY — DX: Gastro-esophageal reflux disease without esophagitis: K21.9

## 2012-10-01 HISTORY — DX: Unspecified osteoarthritis, unspecified site: M19.90

## 2012-10-01 HISTORY — DX: Shortness of breath: R06.02

## 2012-10-01 LAB — BASIC METABOLIC PANEL
Calcium: 10.4 mg/dL (ref 8.4–10.5)
Chloride: 94 mEq/L — ABNORMAL LOW (ref 96–112)
Creatinine, Ser: 0.95 mg/dL (ref 0.50–1.35)

## 2012-10-01 LAB — CBC
MCV: 89.9 fL (ref 78.0–100.0)
Platelets: 282 10*3/uL (ref 150–400)
RDW: 13.5 % (ref 11.5–15.5)
WBC: 10 10*3/uL (ref 4.0–10.5)

## 2012-10-01 LAB — SURGICAL PCR SCREEN: Staphylococcus aureus: NEGATIVE

## 2012-10-01 NOTE — Pre-Procedure Instructions (Signed)
Joshua [name]  10/01/2012   Your procedure is scheduled on:  10/06/12  Report to [facility] at 530 AM.  Call this number if you have problems the morning of surgery: [phone]   Remember:   Do not eat food or drink liquids after midnight.   Take these medicines the morning of surgery with A SIP OF WATER: all inhalers,hydrocodone   Do not wear jewelry, make-up or nail polish.  Do not wear lotions, powders, or perfumes. You may wear deodorant.  Do not shave 48 hours prior to surgery. Men may shave face and neck.  Do not bring valuables to the hospital.  [facility] is not responsible                   for any belongings or valuables.  Contacts, dentures or bridgework may not be worn into surgery.  Leave suitcase in the car. After surgery it may be brought to your room.  For patients admitted to the hospital, checkout time is 11:00 AM the day of  discharge.   Patients discharged the day of surgery will not be allowed to drive  home.  Name and phone number of your driver: family  Special Instructions: Shower using CHG 2 nights before surgery and the night before surgery.  If you shower the day of surgery use CHG.  Use special wash - you have one bottle of CHG for all showers.  You should use approximately 1/3 of the bottle for each shower.   Please read over the following fact sheets that you were given: Pain Booklet, Coughing and Deep Breathing, MRSA Information and Surgical Site Infection Prevention

## 2012-10-05 MED ORDER — CEFAZOLIN SODIUM-DEXTROSE 2-3 GM-% IV SOLR
2.0000 g | INTRAVENOUS | Status: AC
  Administered 2012-10-06: 2 g via INTRAVENOUS
  Filled 2012-10-05: qty 50

## 2012-10-06 ENCOUNTER — Encounter (HOSPITAL_COMMUNITY)
Admission: RE | Disposition: A | Discharge status: Home / Self Care | Payer: Self-pay | Source: Ambulatory Visit | Attending: Neurosurgery

## 2012-10-06 ENCOUNTER — Inpatient Hospital Stay (HOSPITAL_COMMUNITY): Payer: Medicare Other

## 2012-10-06 ENCOUNTER — Encounter (HOSPITAL_COMMUNITY): Payer: Self-pay | Admitting: *Deleted

## 2012-10-06 ENCOUNTER — Inpatient Hospital Stay (HOSPITAL_COMMUNITY): Payer: Medicare Other | Admitting: Anesthesiology

## 2012-10-06 ENCOUNTER — Encounter (HOSPITAL_COMMUNITY): Payer: Self-pay | Admitting: Anesthesiology

## 2012-10-06 ENCOUNTER — Inpatient Hospital Stay (HOSPITAL_COMMUNITY)
Admission: RE | Admit: 2012-10-06 | Discharge: 2012-10-07 | DRG: 473 | Disposition: A | Discharge status: Home / Self Care | Payer: Medicare Other | Source: Ambulatory Visit | Attending: Neurosurgery | Admitting: Neurosurgery

## 2012-10-06 ENCOUNTER — Other Ambulatory Visit: Payer: Self-pay | Admitting: Internal Medicine

## 2012-10-06 DIAGNOSIS — M129 Arthropathy, unspecified: Secondary | ICD-10-CM | POA: Diagnosis present

## 2012-10-06 DIAGNOSIS — Z01812 Encounter for preprocedural laboratory examination: Secondary | ICD-10-CM

## 2012-10-06 DIAGNOSIS — I10 Essential (primary) hypertension: Secondary | ICD-10-CM | POA: Diagnosis present

## 2012-10-06 DIAGNOSIS — M5010 Cervical disc disorder with radiculopathy, unspecified cervical region: Secondary | ICD-10-CM

## 2012-10-06 DIAGNOSIS — M5020 Other cervical disc displacement, unspecified cervical region: Principal | ICD-10-CM | POA: Diagnosis present

## 2012-10-06 DIAGNOSIS — I739 Peripheral vascular disease, unspecified: Secondary | ICD-10-CM | POA: Diagnosis present

## 2012-10-06 DIAGNOSIS — F17200 Nicotine dependence, unspecified, uncomplicated: Secondary | ICD-10-CM | POA: Diagnosis present

## 2012-10-06 DIAGNOSIS — J449 Chronic obstructive pulmonary disease, unspecified: Secondary | ICD-10-CM | POA: Diagnosis present

## 2012-10-06 DIAGNOSIS — J4489 Other specified chronic obstructive pulmonary disease: Secondary | ICD-10-CM | POA: Diagnosis present

## 2012-10-06 DIAGNOSIS — Z981 Arthrodesis status: Secondary | ICD-10-CM

## 2012-10-06 HISTORY — PX: ANTERIOR CERVICAL DECOMP/DISCECTOMY FUSION: SHX1161

## 2012-10-06 SURGERY — ANTERIOR CERVICAL DECOMPRESSION/DISCECTOMY FUSION 1 LEVEL
Anesthesia: General | Site: Neck | Wound class: Clean

## 2012-10-06 MED ORDER — GLYCOPYRROLATE 0.2 MG/ML IJ SOLN
INTRAMUSCULAR | Status: DC | PRN
  Administered 2012-10-06: 0.6 mg via INTRAVENOUS

## 2012-10-06 MED ORDER — SODIUM CHLORIDE 0.9 % IV SOLN
INTRAVENOUS | Status: AC
  Filled 2012-10-06: qty 500

## 2012-10-06 MED ORDER — ONDANSETRON HCL 4 MG/2ML IJ SOLN: 4.0000 mg | Freq: Four times a day (QID) | INTRAMUSCULAR | Status: DC | PRN

## 2012-10-06 MED ORDER — ACETAMINOPHEN 325 MG PO TABS: 650.0000 mg | ORAL_TABLET | ORAL | Status: DC | PRN

## 2012-10-06 MED ORDER — THROMBIN 5000 UNITS EX SOLR
CUTANEOUS | Status: DC | PRN
  Administered 2012-10-06 (×2): 5000 [IU] via TOPICAL

## 2012-10-06 MED ORDER — MENTHOL 3 MG MT LOZG: 1.0000 | LOZENGE | OROMUCOSAL | Status: DC | PRN

## 2012-10-06 MED ORDER — DEXAMETHASONE 4 MG PO TABS
4.0000 mg | ORAL_TABLET | Freq: Four times a day (QID) | ORAL | Status: AC
  Administered 2012-10-06: 4 mg via ORAL
  Filled 2012-10-06: qty 1

## 2012-10-06 MED ORDER — HYDROCODONE-ACETAMINOPHEN 5-325 MG PO TABS
1.0000 | ORAL_TABLET | ORAL | Status: DC | PRN
  Administered 2012-10-06 – 2012-10-07 (×3): 2 via ORAL
  Filled 2012-10-06 (×3): qty 2

## 2012-10-06 MED ORDER — SODIUM CHLORIDE 0.9 % IJ SOLN
3.0000 mL | Freq: Two times a day (BID) | INTRAMUSCULAR | Status: DC
  Administered 2012-10-06: 3 mL via INTRAVENOUS

## 2012-10-06 MED ORDER — ONDANSETRON HCL 4 MG/2ML IJ SOLN
INTRAMUSCULAR | Status: DC | PRN
  Administered 2012-10-06: 4 mg via INTRAVENOUS

## 2012-10-06 MED ORDER — SODIUM CHLORIDE 0.9 % IR SOLN
Status: DC | PRN
  Administered 2012-10-06: 08:00:00

## 2012-10-06 MED ORDER — KCL IN DEXTROSE-NACL 20-5-0.45 MEQ/L-%-% IV SOLN
80.0000 mL/h | INTRAVENOUS | Status: DC
  Filled 2012-10-06 (×3): qty 1000

## 2012-10-06 MED ORDER — HYDROMORPHONE HCL PF 1 MG/ML IJ SOLN
INTRAMUSCULAR | Status: AC
  Filled 2012-10-06: qty 1

## 2012-10-06 MED ORDER — NEOSTIGMINE METHYLSULFATE 1 MG/ML IJ SOLN
INTRAMUSCULAR | Status: DC | PRN
  Administered 2012-10-06: 4 mg via INTRAVENOUS

## 2012-10-06 MED ORDER — BACITRACIN 50000 UNITS IM SOLR
INTRAMUSCULAR | Status: AC
  Filled 2012-10-06: qty 1

## 2012-10-06 MED ORDER — ROCURONIUM BROMIDE 100 MG/10ML IV SOLN
INTRAVENOUS | Status: DC | PRN
  Administered 2012-10-06 (×2): 5 mg via INTRAVENOUS
  Administered 2012-10-06: 50 mg via INTRAVENOUS
  Administered 2012-10-06: 10 mg via INTRAVENOUS

## 2012-10-06 MED ORDER — LACTATED RINGERS IV SOLN
INTRAVENOUS | Status: DC | PRN
  Administered 2012-10-06 (×2): via INTRAVENOUS

## 2012-10-06 MED ORDER — FENTANYL CITRATE 0.05 MG/ML IJ SOLN
INTRAMUSCULAR | Status: DC | PRN
  Administered 2012-10-06: 100 ug via INTRAVENOUS
  Administered 2012-10-06 (×2): 50 ug via INTRAVENOUS
  Administered 2012-10-06: 100 ug via INTRAVENOUS
  Administered 2012-10-06 (×3): 50 ug via INTRAVENOUS

## 2012-10-06 MED ORDER — OXYCODONE HCL 5 MG PO TABS
ORAL_TABLET | ORAL | Status: AC
  Filled 2012-10-06: qty 1

## 2012-10-06 MED ORDER — HYDROMORPHONE HCL PF 1 MG/ML IJ SOLN
0.2500 mg | INTRAMUSCULAR | Status: DC | PRN
  Administered 2012-10-06 (×2): 0.5 mg via INTRAVENOUS

## 2012-10-06 MED ORDER — ONDANSETRON HCL 4 MG/2ML IJ SOLN: 4.0000 mg | INTRAMUSCULAR | Status: DC | PRN

## 2012-10-06 MED ORDER — ZOLPIDEM TARTRATE 5 MG PO TABS
5.0000 mg | ORAL_TABLET | Freq: Every evening | ORAL | Status: DC | PRN
  Administered 2012-10-06: 5 mg via ORAL
  Filled 2012-10-06: qty 1

## 2012-10-06 MED ORDER — OXYCODONE HCL 5 MG PO TABS
5.0000 mg | ORAL_TABLET | Freq: Once | ORAL | Status: AC | PRN
  Administered 2012-10-06: 5 mg via ORAL

## 2012-10-06 MED ORDER — ACETAMINOPHEN 650 MG RE SUPP: 650.0000 mg | RECTAL | Status: DC | PRN

## 2012-10-06 MED ORDER — PROPOFOL 10 MG/ML IV BOLUS
INTRAVENOUS | Status: DC | PRN
  Administered 2012-10-06: 180 mg via INTRAVENOUS
  Administered 2012-10-06: 20 mg via INTRAVENOUS

## 2012-10-06 MED ORDER — OXYCODONE HCL 5 MG/5ML PO SOLN: 5.0000 mg | Freq: Once | ORAL | Status: AC | PRN

## 2012-10-06 MED ORDER — HEMOSTATIC AGENTS (NO CHARGE) OPTIME
TOPICAL | Status: DC | PRN
  Administered 2012-10-06: 1 via TOPICAL

## 2012-10-06 MED ORDER — SODIUM CHLORIDE 0.9 % IJ SOLN: 3.0000 mL | INTRAMUSCULAR | Status: DC | PRN

## 2012-10-06 MED ORDER — FLUTICASONE PROPIONATE HFA 44 MCG/ACT IN AERO
1.0000 | INHALATION_SPRAY | Freq: Two times a day (BID) | RESPIRATORY_TRACT | Status: DC
  Administered 2012-10-06 – 2012-10-07 (×2): 1 via RESPIRATORY_TRACT
  Filled 2012-10-06: qty 10.6

## 2012-10-06 MED ORDER — HYDROCHLOROTHIAZIDE 25 MG PO TABS
25.0000 mg | ORAL_TABLET | Freq: Every day | ORAL | Status: DC
  Administered 2012-10-07: 25 mg via ORAL
  Filled 2012-10-06: qty 1

## 2012-10-06 MED ORDER — MIDAZOLAM HCL 5 MG/5ML IJ SOLN
INTRAMUSCULAR | Status: DC | PRN
  Administered 2012-10-06: 2 mg via INTRAVENOUS

## 2012-10-06 MED ORDER — ARTIFICIAL TEARS OP OINT
TOPICAL_OINTMENT | OPHTHALMIC | Status: DC | PRN
  Administered 2012-10-06: 1 via OPHTHALMIC

## 2012-10-06 MED ORDER — CYCLOBENZAPRINE HCL 10 MG PO TABS
ORAL_TABLET | ORAL | Status: AC
  Filled 2012-10-06: qty 1

## 2012-10-06 MED ORDER — CEFAZOLIN SODIUM-DEXTROSE 2-3 GM-% IV SOLR
2.0000 g | Freq: Three times a day (TID) | INTRAVENOUS | Status: AC
  Administered 2012-10-06 (×2): 2 g via INTRAVENOUS
  Filled 2012-10-06 (×4): qty 50

## 2012-10-06 MED ORDER — 0.9 % SODIUM CHLORIDE (POUR BTL) OPTIME
TOPICAL | Status: DC | PRN
  Administered 2012-10-06: 1000 mL

## 2012-10-06 MED ORDER — PHENOL 1.4 % MT LIQD: 1.0000 | OROMUCOSAL | Status: DC | PRN

## 2012-10-06 MED ORDER — HYDROMORPHONE HCL PF 1 MG/ML IJ SOLN: 1.0000 mg | INTRAMUSCULAR | Status: DC | PRN

## 2012-10-06 MED ORDER — LIDOCAINE HCL (CARDIAC) 20 MG/ML IV SOLN
INTRAVENOUS | Status: DC | PRN
  Administered 2012-10-06: 40 mg via INTRAVENOUS
  Administered 2012-10-06: 60 mg via INTRAVENOUS

## 2012-10-06 MED ORDER — CYCLOBENZAPRINE HCL 10 MG PO TABS
10.0000 mg | ORAL_TABLET | Freq: Three times a day (TID) | ORAL | Status: DC | PRN
  Administered 2012-10-06 – 2012-10-07 (×3): 10 mg via ORAL
  Filled 2012-10-06 (×2): qty 1

## 2012-10-06 MED ORDER — DEXAMETHASONE SODIUM PHOSPHATE 4 MG/ML IJ SOLN
4.0000 mg | Freq: Four times a day (QID) | INTRAMUSCULAR | Status: AC
  Administered 2012-10-06: 4 mg via INTRAVENOUS
  Filled 2012-10-06: qty 1

## 2012-10-06 SURGICAL SUPPLY — 57 items
BAG DECANTER FOR FLEXI CONT (MISCELLANEOUS) ×2 IMPLANT
BENZOIN TINCTURE PRP APPL 2/3 (GAUZE/BANDAGES/DRESSINGS) ×2 IMPLANT
BRUSH SCRUB EZ PLAIN DRY (MISCELLANEOUS) ×2 IMPLANT
BUR MATCHSTICK NEURO 3.0 LAGG (BURR) ×2 IMPLANT
CAGE PEEK TAPERED STALIF 8.5MM (Cage) ×2 IMPLANT
CANISTER SUCTION 2500CC (MISCELLANEOUS) ×2 IMPLANT
CLOTH BEACON ORANGE TIMEOUT ST (SAFETY) ×2 IMPLANT
CONT SPEC 4OZ CLIKSEAL STRL BL (MISCELLANEOUS) ×2 IMPLANT
DRAIN CHANNEL 7F 3/4 FLAT (WOUND CARE) ×2 IMPLANT
DRAPE C-ARM 42X72 X-RAY (DRAPES) ×4 IMPLANT
DRAPE LAPAROTOMY 100X72 PEDS (DRAPES) ×2 IMPLANT
DRAPE MICROSCOPE LEICA (MISCELLANEOUS) ×2 IMPLANT
DRAPE MICROSCOPE ZEISS OPMI (DRAPES) IMPLANT
DRAPE POUCH INSTRU U-SHP 10X18 (DRAPES) ×2 IMPLANT
DRAPE SURG 17X23 STRL (DRAPES) ×4 IMPLANT
DRESSING TELFA 8X3 (GAUZE/BANDAGES/DRESSINGS) ×2 IMPLANT
DURAPREP 6ML APPLICATOR 50/CS (WOUND CARE) ×2 IMPLANT
ELECT COATED BLADE 2.86 ST (ELECTRODE) ×2 IMPLANT
ELECT REM PT RETURN 9FT ADLT (ELECTROSURGICAL) ×2
ELECTRODE REM PT RTRN 9FT ADLT (ELECTROSURGICAL) ×1 IMPLANT
EVACUATOR SILICONE 100CC (DRAIN) ×2 IMPLANT
GAUZE SPONGE 4X4 16PLY XRAY LF (GAUZE/BANDAGES/DRESSINGS) IMPLANT
GLOVE BIOGEL M 8.0 STRL (GLOVE) ×2 IMPLANT
GLOVE BIOGEL PI IND STRL 7.0 (GLOVE) ×4 IMPLANT
GLOVE BIOGEL PI INDICATOR 7.0 (GLOVE) ×4
GLOVE ECLIPSE 7.5 STRL STRAW (GLOVE) ×2 IMPLANT
GLOVE EXAM NITRILE LRG STRL (GLOVE) IMPLANT
GLOVE EXAM NITRILE XL STR (GLOVE) IMPLANT
GLOVE EXAM NITRILE XS STR PU (GLOVE) IMPLANT
GLOVE SURG SS PI 7.0 STRL IVOR (GLOVE) ×8 IMPLANT
GOWN BRE IMP SLV AUR LG STRL (GOWN DISPOSABLE) ×2 IMPLANT
GOWN BRE IMP SLV AUR XL STRL (GOWN DISPOSABLE) ×8 IMPLANT
GOWN STRL REIN 2XL LVL4 (GOWN DISPOSABLE) IMPLANT
HEAD HALTER (SOFTGOODS) ×2 IMPLANT
KIT BASIN OR (CUSTOM PROCEDURE TRAY) ×2 IMPLANT
KIT ROOM TURNOVER OR (KITS) ×2 IMPLANT
NEEDLE SPNL 20GX3.5 QUINCKE YW (NEEDLE) ×2 IMPLANT
NS IRRIG 1000ML POUR BTL (IV SOLUTION) ×2 IMPLANT
PACK LAMINECTOMY NEURO (CUSTOM PROCEDURE TRAY) ×2 IMPLANT
PAD ARMBOARD 7.5X6 YLW CONV (MISCELLANEOUS) ×2 IMPLANT
PATTIES SURGICAL .25X.25 (GAUZE/BANDAGES/DRESSINGS) IMPLANT
PATTIES SURGICAL .75X.75 (GAUZE/BANDAGES/DRESSINGS) ×2 IMPLANT
RUBBERBAND STERILE (MISCELLANEOUS) ×4 IMPLANT
SCREW PRIM STALIF LG ABO 15MM (Screw) ×6 IMPLANT
SPONGE GAUZE 4X4 12PLY (GAUZE/BANDAGES/DRESSINGS) ×2 IMPLANT
SPONGE INTESTINAL PEANUT (DISPOSABLE) ×2 IMPLANT
SPONGE SURGIFOAM ABS GEL SZ50 (HEMOSTASIS) ×2 IMPLANT
STRIP CLOSURE SKIN 1/2X4 (GAUZE/BANDAGES/DRESSINGS) ×2 IMPLANT
SUT PDS AB 5-0 P3 18 (SUTURE) ×2 IMPLANT
SUT VIC AB 3-0 CP2 18 (SUTURE) ×4 IMPLANT
SYR 20ML ECCENTRIC (SYRINGE) IMPLANT
TAPE CLOTH SURG 4X10 WHT LF (GAUZE/BANDAGES/DRESSINGS) ×2 IMPLANT
TAPE STRIPS DRAPE STRL (GAUZE/BANDAGES/DRESSINGS) ×2 IMPLANT
TOWEL OR 17X24 6PK STRL BLUE (TOWEL DISPOSABLE) ×2 IMPLANT
TOWEL OR 17X26 10 PK STRL BLUE (TOWEL DISPOSABLE) ×2 IMPLANT
TRAP SPECIMEN MUCOUS 40CC (MISCELLANEOUS) ×2 IMPLANT
WATER STERILE IRR 1000ML POUR (IV SOLUTION) ×2 IMPLANT

## 2012-10-06 NOTE — Transfer of Care (Signed)
Immediate Anesthesia Transfer of Care Note  Patient: Joshua [name]  Procedure(s) Performed: Procedure(s) with comments: ANTERIOR CERVICAL DECOMPRESSION/DISCECTOMY FUSION 1 LEVEL (N/A) - C7T1 anterior cervical decompression with fusion plating and bonegraft with Stalif-C (Zimmer)  Patient Location: PACU  Anesthesia Type:General  Level of Consciousness: awake, alert , oriented and sedated  Airway & Oxygen Therapy: Patient Spontanous Breathing and Patient connected to nasal cannula oxygen  Post-op Assessment: Report given to PACU RN, Post -op Vital signs reviewed and stable and Patient moving all extremities  Post vital signs: Reviewed and stable  Complications: No apparent anesthesia complications

## 2012-10-06 NOTE — Plan of Care (Signed)
Problem: Consults Goal: Diagnosis - Spinal Surgery Outcome: Completed/Met Date Met:  10/06/12 Cervical Spine Fusion

## 2012-10-06 NOTE — H&P (Signed)
Joshua [name] is an 57 y.o. male.   Chief Complaint: Right arm pain HPI:  The patient is a 57 year old gentleman who was evaluated in the office right upper extremity discomfort. He's had numerous anterior cervical fusions in the past was tried on conservative therapy which gave him no improvement at this time. General imaging studies which showed fusions in the upper and mid cervical spine but he had a new large disc herniation at C7-T1 centrally into the right with both nerve root and spinal cord compression. After failing additional conservative therapy the patient requested surgery now comes for a C7-T1 anterior cervical discectomy with stalif anterior cervical instrumentation. I've had a long discussion with him regarding the risks and benefits of surgical intervention. The risks discussed include but are not limited to bleeding infection weakness numbness paralysis 12 instrumentation nonunion coma hoarseness and death. We have discussed alternative methods of therapy although risks and benefits of nonintervention. He's had the opportunity to rest numerous questions and appears to understand. He has had anterior cervical procedures in both the right and left side and for safety sake we did have a smoking cord evaluated and both sides were fine.  Past Medical History  Diagnosis Date  . Hypertension   . COPD (chronic obstructive pulmonary disease)   . Spinal disease   . Allergy   . Positive TB test   . Phlebitis   . Shortness of breath   . GERD (gastroesophageal reflux disease)   . Arthritis     Past Surgical History  Procedure Laterality Date  . Arterial bypass surgry    . Cervical spine surgery    . Back surgery      Family History  Problem Relation Age of Onset  . Cancer Mother   . Hypertension Mother   . Hypertension Father   . Stroke Father   . Diabetes Neg Hx   . Early death Neg Hx    Social History:  reports that he has been smoking Cigarettes.  He has a 22 pack-year  smoking history. He has never used smokeless tobacco. He reports that he does not drink alcohol or use illicit drugs.  Allergies:  Allergies  Allergen Reactions  . Contrast Media (Iodinated Diagnostic Agents) Hives and Itching  . Ibuprofen Other (See Comments)    Internal bleeding    Medications Prior to Admission  Medication Sig Dispense Refill  . beclomethasone (QVAR) 80 MCG/ACT inhaler Inhale 1 puff into the lungs 2 (two) times daily as needed. Shortness of breath      . HYDROcodone-acetaminophen (NORCO) 10-325 MG per tablet Take 1 tablet by mouth every 8 (eight) hours as needed for pain.  90 tablet  0  . lisinopril (PRINIVIL,ZESTRIL) 40 MG tablet TAKE 1 TABLET (40 MG TOTAL) BY MOUTH DAILY.  30 tablet  5  . zolpidem (AMBIEN) 5 MG tablet Take 1 tablet (5 mg total) by mouth at bedtime as needed for sleep.  30 tablet  0  . hydrochlorothiazide (HYDRODIURIL) 25 MG tablet TAKE 0.5 TABLETS (12.5 MG TOTAL) BY MOUTH DAILY.  30 tablet  2    No results found for this or any previous visit (from the past 48 hour(s)). No results found.  Review of systems not obtained due to patient factors.  Blood pressure 102/69, pulse 62, temperature 97.2 F (36.2 C), temperature source Oral, resp. rate 18, SpO2 97.00%.  The patient is awake or and oriented. His no facial asymmetry. His gait is nonantalgic. Reflexes are slightly decreased but  equal. He some decreased strength in the right distal upper extremity her sensation is intact Assessment/Plan Impression is that of a disc herniation at C7-T1 with the spinal cord and nerve root compression. The plan is for a C7-T1 anterior discectomy with stay lift implant.  Reinaldo Meeker, MD 10/06/2012, 7:16 AM

## 2012-10-06 NOTE — Progress Notes (Signed)
Orthopedic Tech Progress Note Patient Details:  Joshua [name] 04-16-1955 [id number]  Ortho Devices Type of Ortho Device: Soft collar Ortho Device/Splint Interventions: Application   Cammer, Mickie Bail 10/06/2012, 10:53 AM

## 2012-10-06 NOTE — Anesthesia Preprocedure Evaluation (Signed)
Anesthesia Evaluation  Patient identified by MRN, date of birth, ID band Patient awake    Reviewed: Allergy & Precautions, H&P , NPO status , Patient's Chart, lab work & pertinent test results  Airway Mallampati: II  Neck ROM: full    Dental   Pulmonary shortness of breath, COPDCurrent Smoker,          Cardiovascular hypertension, + Peripheral Vascular Disease     Neuro/Psych    GI/Hepatic GERD-  ,  Endo/Other    Renal/GU      Musculoskeletal  (+) Arthritis -,   Abdominal   Peds  Hematology   Anesthesia Other Findings   Reproductive/Obstetrics                           Anesthesia Physical Anesthesia Plan  ASA: III  Anesthesia Plan: General   Post-op Pain Management:    Induction: Intravenous  Airway Management Planned: Oral ETT  Additional Equipment:   Intra-op Plan:   Post-operative Plan: Extubation in OR  Informed Consent: I have reviewed the patients History and Physical, chart, labs and discussed the procedure including the risks, benefits and alternatives for the proposed anesthesia with the patient or authorized representative who has indicated his/her understanding and acceptance.     Plan Discussed with: CRNA, Anesthesiologist and Surgeon  Anesthesia Plan Comments:         Anesthesia Quick Evaluation

## 2012-10-06 NOTE — Progress Notes (Signed)
UR COMPLETED  

## 2012-10-06 NOTE — Op Note (Signed)
Preop diagnosis: Herniated disc at C7-T1 right Postop diagnosis: Same Procedure: C7-T1 decompressive anterior cervical discectomy with peek interbody spacer fusion and stalif anterior cervical fixation Surgeon: Eulalia Ellerman Assistant: Botero  After and placed in the supine position and 10 pounds halter traction the patient's neck was prepped and draped in usual sterile fashion. Localizing fluoroscopy was used prior to incision to identify the appropriate level. Transverse incision was made in the right anterior neck started the midline and headed towards the medial aspect the sternomastoid muscle. The platysma muscle was then incised transversely and the natural fascial plane between the strap muscles medially and the sternal cremaster laterally was identified and followed down to the anterior aspect the cervical spine. The longus coli muscles were identified and split in the midline and Triple-A bilaterally with unipolar coagulation and Kitner dissection. The previous anterior cervical plate was identified just inferior to it were able to identify the C7-T1 disc space. We placed a self-retaining retractor and incised the disc with a 15 blade. Using pituitary rongeurs and curettes approximately 90% of the disc material was removed. High-speed drill was used to widen the interspace and bony shavings were saved for use later in the case. At this time the microscope was draped brought in the field and used for the remainder of the case. Using microdissection technique the remainder of the disc material down to the posterior longitudinal ligament was removed. Ligament was then incised transversely and the cut edges removed a Kerrison punch. Thorough decompression was then carried out on the spinal dura and to the proximal foramen bilaterally until the C8 nerve roots were well visualized and well decompressed. Large amounts of herniated disc identify towards the right side this was removed to decompress the nerve on  that side. At this time inspection was carried out in all directions for any evidence of residual compression and none could be identified. Irrigation was carried out and any bleeding control proper coagulation Gelfoam. Measurements were taken and an 8.5 mm stalif peek spacer was chosen. It was filled with a mixture of autologous bone morselized allograft and impacted the disc space without difficulty. We then took 15 mm screws broke the cortical surface with the awl and then placed a 50 mm screws through the spacer in standard fashion. Fossae showed them to be in good position we got good approximation of the spacer up against the vertebral endplates. At this time irrigation was carried out any bleeding control proper coagulation and Gelfoam. We did leave a drain in the prevertebral space after closure during the placement dressing it did become dislodged. Sterile dressing was then applied along the soft collar and the patient was extubated and taken to recovery room after closure had been done with inverted Vicryl on the platysma muscle subcuticular layer and Steri-Strips on the skin.

## 2012-10-06 NOTE — Anesthesia Postprocedure Evaluation (Signed)
Anesthesia Post Note  Patient: Joshua [name]  Procedure(s) Performed: Procedure(s) (LRB): ANTERIOR CERVICAL DECOMPRESSION/DISCECTOMY FUSION 1 LEVEL (N/A)  Anesthesia type: General  Patient location: PACU  Post pain: Pain level controlled and Adequate analgesia  Post assessment: Post-op Vital signs reviewed, Patient's Cardiovascular Status Stable, Respiratory Function Stable, Patent Airway and Pain level controlled  Last Vitals:  Filed Vitals:   10/06/12 1125  BP: 144/80  Pulse: 80  Temp:   Resp: 15    Post vital signs: Reviewed and stable  Level of consciousness: awake, alert  and oriented  Complications: No apparent anesthesia complications

## 2012-10-06 NOTE — Preoperative (Signed)
Beta Blockers   Reason not to administer Beta Blockers:Not Applicable 

## 2012-10-07 MED ORDER — HYDROCODONE-ACETAMINOPHEN 5-325 MG PO TABS: 1.0000 | ORAL_TABLET | ORAL | Status: DC | PRN

## 2012-10-07 NOTE — Progress Notes (Addendum)
Pt. discharged home accompanied by husband. Prescriptions and discharge instructions given with verbalization of understanding. Incision site on neck with no s/s of infection - no swelling, redness, bleeding, and/or drainage noted. Soft collar intact. Opportunity given to ask questions but no question asked. Pt. transported out of this unit by the nurse tech

## 2012-10-07 NOTE — Discharge Summary (Signed)
  Physician Discharge Summary  Patient ID: Joshua [name] MRN: [id number] DOB/AGE: 1955/05/08 57 y.o.  Admit date: 10/06/2012 Discharge date: 10/07/2012  Admission Diagnoses:  Discharge Diagnoses:  Active Problems:   * No active hospital problems. *   Discharged Condition: good  Hospital Course: Surgery one day for acdf c7 T1. Did well. No pain post op. Ambulated well. Less numbness. Home pod 1, specific instructions given.  Consults: None  Significant Diagnostic Studies: none  Treatments: surgery: C7 T1 acdf with fixation  Discharge Exam: Blood pressure 146/92, pulse 95, temperature 97.9 F (36.6 C), temperature source Oral, resp. rate 20, SpO2 96.00%. Incision/Wound:clean and dry  Disposition: 01-Home or Self Care   Future Appointments Provider Department Dept Phone   03/02/2013 10:00 AM Nicki Reaper, NP [facility] [phone]       Medication List    ASK your doctor about these medications       beclomethasone 80 MCG/ACT inhaler  Commonly known as:  QVAR  Inhale 1 puff into the lungs 2 (two) times daily as needed. Shortness of breath     hydrochlorothiazide 25 MG tablet  Commonly known as:  HYDRODIURIL  TAKE 0.5 TABLETS (12.5 MG TOTAL) BY MOUTH DAILY.     HYDROcodone-acetaminophen 10-325 MG per tablet  Commonly known as:  NORCO  TAKE 1 TABLET EVERY 8 HOURS AS NEEDED FOR PAIN     lisinopril 40 MG tablet  Commonly known as:  PRINIVIL,ZESTRIL  TAKE 1 TABLET (40 MG TOTAL) BY MOUTH DAILY.     zolpidem 5 MG tablet  Commonly known as:  AMBIEN  Take 1 tablet (5 mg total) by mouth at bedtime as needed for sleep.         At home rest most of the time. Get up 9 or 10 times each day and take a 15 or 20 minute walk. No riding in the car and to your first postoperative appointment. If you have neck surgery you may shower from the chest down starting on the third postoperative day. If you had back surgery he may start showering on the  third postoperative day with saran wrap wrapped around your incisional area 3 times. After the shower remove the saran wrap. Take pain medicine as needed and other medications as instructed. Call my office for an appointment.  SignedReinaldo Meeker, MD 10/07/2012, 8:34 AM

## 2012-10-08 ENCOUNTER — Encounter (HOSPITAL_COMMUNITY): Payer: Self-pay | Admitting: Neurosurgery

## 2012-10-09 ENCOUNTER — Other Ambulatory Visit: Payer: Self-pay | Admitting: Internal Medicine

## 2012-10-26 ENCOUNTER — Other Ambulatory Visit: Payer: Self-pay | Admitting: *Deleted

## 2012-10-26 MED ORDER — LISINOPRIL 40 MG PO TABS: ORAL_TABLET | ORAL | Status: DC

## 2012-11-09 ENCOUNTER — Other Ambulatory Visit: Payer: Self-pay | Admitting: Internal Medicine

## 2012-11-11 ENCOUNTER — Telehealth: Payer: Self-pay | Admitting: *Deleted

## 2012-11-11 ENCOUNTER — Other Ambulatory Visit: Payer: Self-pay | Admitting: Internal Medicine

## 2012-11-11 MED ORDER — HYDROCODONE-ACETAMINOPHEN 10-325 MG PO TABS: 1.0000 | ORAL_TABLET | Freq: Three times a day (TID) | ORAL | Status: DC | PRN

## 2012-11-11 NOTE — Telephone Encounter (Signed)
Ok then we will continue the 10-325. Ok to try Norvasc 10 mg # 30,2 refills to see if this helps with the BP ( can you please call this in?)

## 2012-11-11 NOTE — Telephone Encounter (Signed)
Can we call this pt and ask him why he has 2 diferent doses of the same medication on his list. He can only get a refill of the 5-325, i will not refill the 10-325.

## 2012-11-11 NOTE — Telephone Encounter (Signed)
Pt notified that Rx has been called in.

## 2012-11-11 NOTE — Telephone Encounter (Signed)
Pt called states he stopped Lisinopril due to choking and throat swelling.  Requesting another BP medication, states his BP has been elevated with only the HCTZ.  Pt further states he is on the 10-325 Hydrocodone. prescribed by you. States the 5-325mg  was prescribed by surgeon post surgery.  Please advise

## 2012-11-25 ENCOUNTER — Other Ambulatory Visit: Payer: Self-pay | Admitting: Internal Medicine

## 2012-11-25 NOTE — Telephone Encounter (Signed)
Phoned in to pharmacy. 

## 2013-03-02 ENCOUNTER — Ambulatory Visit: Payer: Medicare Other | Admitting: Internal Medicine

## 2014-03-18 HISTORY — PX: ELBOW SURGERY: SHX618

## 2014-07-16 ENCOUNTER — Emergency Department (HOSPITAL_COMMUNITY)
Admission: EM | Admit: 2014-07-16 | Discharge: 2014-07-16 | Disposition: A | Discharge status: Home / Self Care | Payer: Medicare Other | Attending: Emergency Medicine | Admitting: Emergency Medicine

## 2014-07-16 ENCOUNTER — Encounter (HOSPITAL_COMMUNITY): Payer: Self-pay | Admitting: Emergency Medicine

## 2014-07-16 DIAGNOSIS — I10 Essential (primary) hypertension: Secondary | ICD-10-CM | POA: Insufficient documentation

## 2014-07-16 DIAGNOSIS — J302 Other seasonal allergic rhinitis: Secondary | ICD-10-CM

## 2014-07-16 DIAGNOSIS — Z79899 Other long term (current) drug therapy: Secondary | ICD-10-CM | POA: Diagnosis not present

## 2014-07-16 DIAGNOSIS — Z760 Encounter for issue of repeat prescription: Secondary | ICD-10-CM | POA: Diagnosis not present

## 2014-07-16 DIAGNOSIS — J441 Chronic obstructive pulmonary disease with (acute) exacerbation: Secondary | ICD-10-CM | POA: Diagnosis not present

## 2014-07-16 DIAGNOSIS — Z8719 Personal history of other diseases of the digestive system: Secondary | ICD-10-CM | POA: Insufficient documentation

## 2014-07-16 DIAGNOSIS — M1990 Unspecified osteoarthritis, unspecified site: Secondary | ICD-10-CM | POA: Diagnosis not present

## 2014-07-16 MED ORDER — HYDROCHLOROTHIAZIDE 25 MG PO TABS: 25.0000 mg | ORAL_TABLET | Freq: Every day | ORAL | Status: DC

## 2014-07-16 MED ORDER — CLONIDINE HCL 0.1 MG PO TABS
0.3000 mg | ORAL_TABLET | Freq: Three times a day (TID) | ORAL | Status: DC | PRN
  Administered 2014-07-16: 0.3 mg via ORAL
  Filled 2014-07-16: qty 3

## 2014-07-16 MED ORDER — CLONIDINE HCL 0.3 MG PO TABS: 0.3000 mg | ORAL_TABLET | Freq: Two times a day (BID) | ORAL | Status: DC

## 2014-07-16 MED ORDER — CETIRIZINE HCL 10 MG PO TABS: 10.0000 mg | ORAL_TABLET | Freq: Every day | ORAL | Status: DC

## 2014-07-16 NOTE — ED Notes (Signed)
Per pt, states he has been incarcerated for over a month and was not given any scripts once he was released

## 2014-07-16 NOTE — ED Provider Notes (Signed)
CSN: [id number]     Arrival date & time 07/16/14  9833 History   First MD Initiated Contact with Patient 07/16/14 [phone]     Chief Complaint  Patient presents with  . Hypertension     (Consider location/radiation/quality/duration/timing/severity/associated sxs/prior Treatment) HPI Comments: 59 year old male, recently incarcerated who has been off of his medications for over a month since being released from the hospital. He states that over that time he has had intermittent headaches, seasonal allergies with coughing, sneezing, itchy eyes and itchy ears. He took his blood pressure this morning at the pharmacy and noted that it was very high, he presents for medication refills. He denies numbness weakness change in vision, change in balance, does not have a headache at this time, no chest pain or shortness of breath. He does have albuterol for his coughing and wheezing because he does have a long-time history of smoking and likely COPD. This has been helping at home with a cough  Patient is a 59 y.o. male presenting with hypertension. The history is provided by the patient and the spouse.  Hypertension    Past Medical History  Diagnosis Date  . Hypertension   . COPD (chronic obstructive pulmonary disease)   . Spinal disease   . Allergy   . Positive TB test   . Phlebitis   . Shortness of breath   . GERD (gastroesophageal reflux disease)   . Arthritis    Past Surgical History  Procedure Laterality Date  . Arterial bypass surgry    . Cervical spine surgery    . Back surgery    . Anterior cervical decomp/discectomy fusion N/A 10/06/2012    Procedure: ANTERIOR CERVICAL DECOMPRESSION/DISCECTOMY FUSION 1 LEVEL;  Surgeon: [name], MD;  Location: [facility];  Service: Neurosurgery;  Laterality: N/A;  C7T1 anterior cervical decompression with fusion plating and bonegraft with Stalif-C (Zimmer)   Family History  Problem Relation Age of Onset  . Cancer Mother   . Hypertension  Mother   . Hypertension Father   . Stroke Father   . Diabetes Neg Hx   . Early death Neg Hx    History  Substance Use Topics  . Smoking status: Current Every Day Smoker -- 0.50 packs/day for 44 years    Types: Cigarettes  . Smokeless tobacco: Never Used  . Alcohol Use: No    Review of Systems  All other systems reviewed and are negative.     Allergies  Contrast media and Ibuprofen  Home Medications   Prior to Admission medications   Medication Sig Start Date End Date Taking? Authorizing Provider  beclomethasone (QVAR) 80 MCG/ACT inhaler Inhale 1 puff into the lungs 2 (two) times daily as needed. Shortness of breath    Historical Provider, MD  cetirizine (ZYRTEC ALLERGY) 10 MG tablet Take 1 tablet (10 mg total) by mouth daily. 07/16/14   Noemi Chapel, MD  cloNIDine (CATAPRES) 0.3 MG tablet Take 1 tablet (0.3 mg total) by mouth 2 (two) times daily. 07/16/14   Noemi Chapel, MD  hydrochlorothiazide (HYDRODIURIL) 25 MG tablet Take 1 tablet (25 mg total) by mouth daily. 07/16/14   Noemi Chapel, MD  HYDROcodone-acetaminophen (NORCO) 10-325 MG per tablet Take 1 tablet by mouth every 8 (eight) hours as needed for pain. 11/11/12   Jearld Fenton, NP  zolpidem (AMBIEN) 5 MG tablet TAKE 1 TABLET AT BEDTIME FOR SLEEP 11/25/12   Jearld Fenton, NP   BP 195/104 mmHg  Pulse 97  Resp 18  SpO2 100% Physical Exam  Constitutional: He appears well-developed and well-nourished. No distress.  HENT:  Head: Normocephalic and atraumatic.  Mouth/Throat: Oropharynx is clear and moist. No oropharyngeal exudate.  Tympanic membranes clear bilaterally, oropharynx clear and moist, moist mucous membranes   Eyes: Conjunctivae and EOM are normal. Pupils are equal, round, and reactive to light. Right eye exhibits no discharge. Left eye exhibits no discharge. No scleral icterus.  Neck: Normal range of motion. Neck supple. No JVD present. No thyromegaly present.  Cardiovascular: Normal rate, regular rhythm, normal  heart sounds and intact distal pulses.  Exam reveals no gallop and no friction rub.   No murmur heard. Normal rate and rhythm, no murmurs, no JVD  Pulmonary/Chest: Effort normal. No respiratory distress. He has wheezes (mild end expiratory wheeze intermittently, speaks in full sentences). He has no rales.  Abdominal: Soft. Bowel sounds are normal. He exhibits no distension and no mass. There is no tenderness.  Musculoskeletal: Normal range of motion. He exhibits no edema or tenderness.  No peripheral edema  Lymphadenopathy:    He has no cervical adenopathy.  Neurological: He is alert. Coordination normal.  Normal speech gait cranial nerves III through XII, normal strength and sensation of all 4 extremities  Skin: Skin is warm and dry. No rash noted. No erythema.  Psychiatric: He has a normal mood and affect. His behavior is normal.  Nursing note and vitals reviewed.   ED Course  Procedures (including critical care time) Labs Review Labs Reviewed - No data to display  Imaging Review No results found.    MDM   Final diagnoses:  Essential hypertension  Seasonal allergies    The patient's blood pressure is significantly elevated, he has been off of medications including clonidine and hydrochlorothiazide, will give dose of clonidine at this time, he will be given prescriptions with refills for the next 3 months until his insurance kicks in and he can follow back up with his family provider. The patient is in agreement with this plan, he appears nontoxic and stable for discharge, he is aware of indications for return.  Meds given in ED:  Medications  cloNIDine (CATAPRES) tablet 0.3 mg (not administered)    New Prescriptions   CETIRIZINE (ZYRTEC ALLERGY) 10 MG TABLET    Take 1 tablet (10 mg total) by mouth daily.   CLONIDINE (CATAPRES) 0.3 MG TABLET    Take 1 tablet (0.3 mg total) by mouth 2 (two) times daily.   HYDROCHLOROTHIAZIDE (HYDRODIURIL) 25 MG TABLET    Take 1 tablet (25  mg total) by mouth daily.        Noemi Chapel, MD 07/16/14 [phone]

## 2014-07-16 NOTE — Discharge Instructions (Signed)
Please call your doctor for a followup appointment within 24-48 hours. When you talk to your doctor please let them know that you were seen in the emergency department and have them acquire all of your records so that they can discuss the findings with you and formulate a treatment plan to fully care for your new and ongoing problems. ° °

## 2014-07-27 ENCOUNTER — Encounter: Payer: Self-pay | Admitting: Internal Medicine

## 2014-07-27 ENCOUNTER — Ambulatory Visit (INDEPENDENT_AMBULATORY_CARE_PROVIDER_SITE_OTHER)
Admission: RE | Admit: 2014-07-27 | Discharge: 2014-07-27 | Disposition: A | Discharge status: Home / Self Care | Payer: Medicare Other | Source: Ambulatory Visit | Attending: Internal Medicine | Admitting: Internal Medicine

## 2014-07-27 ENCOUNTER — Ambulatory Visit (INDEPENDENT_AMBULATORY_CARE_PROVIDER_SITE_OTHER): Payer: Medicare Other | Admitting: Internal Medicine

## 2014-07-27 VITALS — BP 124/82 | HR 81 | Temp 98.5°F | Wt 244.0 lb

## 2014-07-27 DIAGNOSIS — M5440 Lumbago with sciatica, unspecified side: Secondary | ICD-10-CM

## 2014-07-27 DIAGNOSIS — J449 Chronic obstructive pulmonary disease, unspecified: Secondary | ICD-10-CM | POA: Diagnosis not present

## 2014-07-27 DIAGNOSIS — I10 Essential (primary) hypertension: Secondary | ICD-10-CM

## 2014-07-27 DIAGNOSIS — G4700 Insomnia, unspecified: Secondary | ICD-10-CM | POA: Diagnosis not present

## 2014-07-27 LAB — COMPREHENSIVE METABOLIC PANEL
ALBUMIN: 3.6 g/dL (ref 3.5–5.2)
ALK PHOS: 79 U/L (ref 39–117)
ALT: 26 U/L (ref 0–53)
AST: 28 U/L (ref 0–37)
BUN: 13 mg/dL (ref 6–23)
CHLORIDE: 98 meq/L (ref 96–112)
CO2: 30 mEq/L (ref 19–32)
Calcium: 9.3 mg/dL (ref 8.4–10.5)
Creatinine, Ser: 0.98 mg/dL (ref 0.40–1.50)
GFR: 83.37 mL/min (ref >60.00)
GLUCOSE: 102 mg/dL — AB (ref 70–99)
POTASSIUM: 3.6 meq/L (ref 3.5–5.1)
SODIUM: 136 meq/L (ref 135–145)
Total Bilirubin: 0.3 mg/dL (ref 0.2–1.2)
Total Protein: 7.8 g/dL (ref 6.0–8.3)

## 2014-07-27 LAB — CBC
HCT: 44.5 % (ref 39.0–52.0)
Hemoglobin: 14.9 g/dL (ref 13.0–17.0)
MCHC: 33.6 g/dL (ref 30.0–36.0)
MCV: 78.3 fl (ref 78.0–100.0)
PLATELETS: 370 10*3/uL (ref 150.0–400.0)
RBC: 5.69 Mil/uL (ref 4.22–5.81)
RDW: 15.9 % — ABNORMAL HIGH (ref 11.5–15.5)
WBC: 13.5 10*3/uL — AB (ref 4.0–10.5)

## 2014-07-27 NOTE — Assessment & Plan Note (Signed)
Seems to have resolved. ?

## 2014-07-27 NOTE — Assessment & Plan Note (Signed)
Well controlled on HCTZ and Clonidine Will check CBC and CMET today

## 2014-07-27 NOTE — Assessment & Plan Note (Signed)
Discussed smoking cessation He is not interested at this time He reports he can not afford inhalers He declines referral to pulmonology

## 2014-07-27 NOTE — Progress Notes (Signed)
Pre visit review using our clinic review tool, if applicable. No additional management support is needed unless otherwise documented below in the visit note. 

## 2014-07-27 NOTE — Patient Instructions (Signed)
Back Exercises These exercises may help you when beginning to rehabilitate your injury. Your symptoms may resolve with or without further involvement from your physician, physical therapist or athletic trainer. While completing these exercises, remember:   Restoring tissue flexibility helps normal motion to return to the joints. This allows healthier, less painful movement and activity.  An effective stretch should be held for at least 30 seconds.  A stretch should never be painful. You should only feel a gentle lengthening or release in the stretched tissue. STRETCH - Extension, Prone on Elbows   Lie on your stomach on the floor, a bed will be too soft. Place your palms about shoulder width apart and at the height of your head.  Place your elbows under your shoulders. If this is too painful, stack pillows under your chest.  Allow your body to relax so that your hips drop lower and make contact more completely with the floor.  Hold this position for __________ seconds.  Slowly return to lying flat on the floor. Repeat __________ times. Complete this exercise __________ times per day.  RANGE OF MOTION - Extension, Prone Press Ups   Lie on your stomach on the floor, a bed will be too soft. Place your palms about shoulder width apart and at the height of your head.  Keeping your back as relaxed as possible, slowly straighten your elbows while keeping your hips on the floor. You may adjust the placement of your hands to maximize your comfort. As you gain motion, your hands will come more underneath your shoulders.  Hold this position __________ seconds.  Slowly return to lying flat on the floor. Repeat __________ times. Complete this exercise __________ times per day.  RANGE OF MOTION- Quadruped, Neutral Spine   Assume a hands and knees position on a firm surface. Keep your hands under your shoulders and your knees under your hips. You may place padding under your knees for  comfort.  Drop your head and point your tail bone toward the ground below you. This will round out your low back like an angry cat. Hold this position for __________ seconds.  Slowly lift your head and release your tail bone so that your back sags into a large arch, like an old horse.  Hold this position for __________ seconds.  Repeat this until you feel limber in your low back.  Now, find your "sweet spot." This will be the most comfortable position somewhere between the two previous positions. This is your neutral spine. Once you have found this position, tense your stomach muscles to support your low back.  Hold this position for __________ seconds. Repeat __________ times. Complete this exercise __________ times per day.  STRETCH - Flexion, Single Knee to Chest   Lie on a firm bed or floor with both legs extended in front of you.  Keeping one leg in contact with the floor, bring your opposite knee to your chest. Hold your leg in place by either grabbing behind your thigh or at your knee.  Pull until you feel a gentle stretch in your low back. Hold __________ seconds.  Slowly release your grasp and repeat the exercise with the opposite side. Repeat __________ times. Complete this exercise __________ times per day.  STRETCH - Hamstrings, Standing  Stand or sit and extend your right / left leg, placing your foot on a chair or foot stool  Keeping a slight arch in your low back and your hips straight forward.  Lead with your chest and   lean forward at the waist until you feel a gentle stretch in the back of your right / left knee or thigh. (When done correctly, this exercise requires leaning only a small distance.)  Hold this position for __________ seconds. Repeat __________ times. Complete this stretch __________ times per day. STRENGTHENING - Deep Abdominals, Pelvic Tilt   Lie on a firm bed or floor. Keeping your legs in front of you, bend your knees so they are both pointed  toward the ceiling and your feet are flat on the floor.  Tense your lower abdominal muscles to press your low back into the floor. This motion will rotate your pelvis so that your tail bone is scooping upwards rather than pointing at your feet or into the floor.  With a gentle tension and even breathing, hold this position for __________ seconds. Repeat __________ times. Complete this exercise __________ times per day.  STRENGTHENING - Abdominals, Crunches   Lie on a firm bed or floor. Keeping your legs in front of you, bend your knees so they are both pointed toward the ceiling and your feet are flat on the floor. Cross your arms over your chest.  Slightly tip your chin down without bending your neck.  Tense your abdominals and slowly lift your trunk high enough to just clear your shoulder blades. Lifting higher can put excessive stress on the low back and does not further strengthen your abdominal muscles.  Control your return to the starting position. Repeat __________ times. Complete this exercise __________ times per day.  STRENGTHENING - Quadruped, Opposite UE/LE Lift   Assume a hands and knees position on a firm surface. Keep your hands under your shoulders and your knees under your hips. You may place padding under your knees for comfort.  Find your neutral spine and gently tense your abdominal muscles so that you can maintain this position. Your shoulders and hips should form a rectangle that is parallel with the floor and is not twisted.  Keeping your trunk steady, lift your right hand no higher than your shoulder and then your left leg no higher than your hip. Make sure you are not holding your breath. Hold this position __________ seconds.  Continuing to keep your abdominal muscles tense and your back steady, slowly return to your starting position. Repeat with the opposite arm and leg. Repeat __________ times. Complete this exercise __________ times per day. Document Released:  03/22/2005 Document Revised: 05/27/2011 Document Reviewed: 06/16/2008 Exit[employer] Patient Information 2015 [employer]. This information is not intended to replace advice given to you by your health care provider. Make sure you discuss any questions you have with your health care provider.  

## 2014-07-27 NOTE — Assessment & Plan Note (Addendum)
Will obtain xray of lumbar spine Will likely need MRI Will start Lyrica, RX given If he has to see a neurosurgeon, he would like to see [place] in [place] as he has seen them in the past Advised him that I would refer him to pain management for further management

## 2014-07-27 NOTE — Progress Notes (Signed)
Subjective:    Patient ID: Joshua [name], male    DOB: [birth year], 59 y.o.   MRN: [id number]  HPI  Pt presents to the clinic today to follow up chronic conditions. He has not been here in > 1 year because he was incarcerated. He did go to the ER 07/16/14 with c/o headaches. They felt like it was related to his blood pressure medication which he had been out of secondary to his incarciration. The refilled his medications and advised him to follow up with his PCP. ER note reviewed.  Chronic back/neck pain: He is complaining of increased low back pain x 6 month which radiates down the back of both legs. He describes the pain as a burning sensation and says that it makes his legs feel heavy and it becomes difficult to walk. He has had to start using a cane in recent months to help him walk. He is requesting pain medication today, states that OTC NSAIDs have not helped the pain. He denies bowel/bladder incontinence, fevers, injury/trauma to the back.   HTN: He was restarted on his HCTZ and Clonidine in the ER [id number]. His headaches have gone away since restarting his medication. His BP today is 124/82. He denies headache, chest pain, chest tightness, SOB at this time.   COPD: He has continued smoking and states that he is not interested in quitting at this time. He has had some SOB when he is up walking around but no limitations in activity. He states that he has not been using the QVAR because it is too expensive.   Insomnia: Seems to have resolved. He is no longer taking the Ambien.  Review of Systems      Past Medical History  Diagnosis Date  . Hypertension   . COPD (chronic obstructive pulmonary disease)   . Spinal disease   . Allergy   . Positive TB test   . Phlebitis   . Shortness of breath   . GERD (gastroesophageal reflux disease)   . Arthritis     Current Outpatient Prescriptions  Medication Sig Dispense Refill  . beclomethasone (QVAR) 80 MCG/ACT inhaler Inhale 1 puff  into the lungs 2 (two) times daily as needed. Shortness of breath    . cetirizine (ZYRTEC ALLERGY) 10 MG tablet Take 1 tablet (10 mg total) by mouth daily. 30 tablet 1  . cloNIDine (CATAPRES) 0.3 MG tablet Take 1 tablet (0.3 mg total) by mouth 2 (two) times daily. 60 tablet 3  . hydrochlorothiazide (HYDRODIURIL) 25 MG tablet Take 1 tablet (25 mg total) by mouth daily. 30 tablet 3  . HYDROcodone-acetaminophen (NORCO) 10-325 MG per tablet Take 1 tablet by mouth every 8 (eight) hours as needed for pain. 90 tablet 0  . [DISCONTINUED] lisinopril (PRINIVIL,ZESTRIL) 40 MG tablet TAKE 1 TABLET (40 MG TOTAL) BY MOUTH DAILY. 90 tablet 1   No current facility-administered medications for this visit.    Allergies  Allergen Reactions  . Contrast Media [Iodinated Diagnostic Agents] Hives and Itching  . Ibuprofen Other (See Comments)    Internal bleeding    Family History  Problem Relation Age of Onset  . Cancer Mother   . Hypertension Mother   . Hypertension Father   . Stroke Father   . Diabetes Neg Hx   . Early death Neg Hx     History   Social History  . Marital Status: Widowed    Spouse Name: N/A  . Number of Children: N/A  . Years of Education:  7   Occupational History  . Disabled    Social History Main Topics  . Smoking status: Current Every Day Smoker -- 0.50 packs/day for 44 years    Types: Cigarettes  . Smokeless tobacco: Never Used  . Alcohol Use: No  . Drug Use: No  . Sexual Activity: Yes   Other Topics Concern  . Not on file   Social History Narrative   Regular exercise-no   Caffeine Use-yes     Constitutional: Denies fever, malaise, fatigue, headache or abrupt weight changes.  Respiratory: Denies difficulty breathing, shortness of breath, cough or sputum production.   Cardiovascular: Denies chest pain, chest tightness, palpitations or swelling in the hands or feet. Musculoskeletal: Pt reports low back pain. Denies muscle pain or joint pain and swelling.  Skin:  Denies redness, rashes, lesions or ulcercations.  Neurological: Denies dizziness, difficulty with memory, difficulty with speech or problems with balance and coordination.   No other specific complaints in a complete review of systems (except as listed in HPI above).  Objective:   Physical Exam  BP 124/82 mmHg  Pulse 81  Temp(Src) 98.5 F (36.9 C) (Oral)  Wt 244 lb (110.678 kg)  SpO2 98% Wt Readings from Last 3 Encounters:  07/27/14 244 lb (110.678 kg)  09/08/12 192 lb 2 oz (87.147 kg)  09/01/12 194 lb (87.998 kg)    General: Appears his stated age, obese in NAD. Skin: Warm, dry and intact. No rashes, lesions or ulcerations noted. HEENT: Head: normal shape and size; Eyes: sclera white, no icterus, conjunctiva pink, PERRLA and EOMs intact;  Cardiovascular: Normal rate and rhythm. S1,S2 noted.  No murmur, rubs or gallops noted.  Pulmonary/Chest: Normal effort and coarse vesicular breath sounds with prolonged expiratory wheezes. No respiratory distress. No rales or ronchi noted.  Musculoskeletal: Decreased flexion, extension and rotation secondary to pain. Pain with palpation over the midline lumbar region. Strength 4/5 BLE R>L. Using gain for assistance with gait. Neurological: Alert and oriented. Sensation intact to BLE.   BMET    Component Value Date/Time   NA 131* 10/01/2012 1030   K 5.0 10/01/2012 1030   CL 94* 10/01/2012 1030   CO2 27 10/01/2012 1030   GLUCOSE 87 10/01/2012 1030   BUN 20 10/01/2012 1030   CREATININE 0.95 10/01/2012 1030   CREATININE 1.08 11/20/2011 1514   CALCIUM 10.4 10/01/2012 1030   GFRNONAA >90 10/01/2012 1030   GFRAA >90 10/01/2012 1030    Lipid Panel  No results found for: CHOL, TRIG, HDL, CHOLHDL, VLDL, LDLCALC  CBC    Component Value Date/Time   WBC 10.0 10/01/2012 1525   RBC 4.67 10/01/2012 1525   HGB 14.5 10/01/2012 1525   HCT 42.0 10/01/2012 1525   PLT 282 10/01/2012 1525   MCV 89.9 10/01/2012 1525   MCH 31.0 10/01/2012 1525    MCHC 34.5 10/01/2012 1525   RDW 13.5 10/01/2012 1525   LYMPHSABS 3.7 08/26/2012 0855   MONOABS 1.3* 08/26/2012 0855   EOSABS 0.3 08/26/2012 0855   BASOSABS 0.1 08/26/2012 0855    Hgb A1C Lab Results  Component Value Date   HGBA1C 5.1 11/20/2011         Assessment & Plan:

## 2014-07-28 ENCOUNTER — Other Ambulatory Visit: Payer: Self-pay | Admitting: Internal Medicine

## 2014-07-28 ENCOUNTER — Telehealth: Payer: Self-pay

## 2014-07-28 DIAGNOSIS — M5440 Lumbago with sciatica, unspecified side: Secondary | ICD-10-CM

## 2014-07-28 MED ORDER — PREGABALIN 75 MG PO CAPS: 75.0000 mg | ORAL_CAPSULE | Freq: Every day | ORAL | Status: DC

## 2014-07-28 NOTE — Telephone Encounter (Signed)
Pt was seen 07/27/14; pt checked with [facility] [address] and no Lyrica rx was there; do not see on med list but on 07/27/14 visit noted under lumbar spondylosis note will start Lyrica; rx given. Pt said he did not get rx. Pt request med sent to [place].Please advise.

## 2014-07-28 NOTE — Telephone Encounter (Signed)
Rx sent and pt notified.

## 2014-07-29 ENCOUNTER — Other Ambulatory Visit: Payer: Self-pay | Admitting: Internal Medicine

## 2014-07-29 MED ORDER — GABAPENTIN 100 MG PO CAPS: 100.0000 mg | ORAL_CAPSULE | Freq: Three times a day (TID) | ORAL | Status: DC

## 2014-08-03 ENCOUNTER — Ambulatory Visit
Admission: RE | Admit: 2014-08-03 | Discharge: 2014-08-03 | Disposition: A | Discharge status: Home / Self Care | Payer: Medicare Other | Source: Ambulatory Visit | Attending: Internal Medicine | Admitting: Internal Medicine

## 2014-08-03 DIAGNOSIS — M5440 Lumbago with sciatica, unspecified side: Secondary | ICD-10-CM

## 2014-08-04 ENCOUNTER — Ambulatory Visit (INDEPENDENT_AMBULATORY_CARE_PROVIDER_SITE_OTHER): Payer: Medicare Other | Admitting: Internal Medicine

## 2014-08-04 ENCOUNTER — Ambulatory Visit (INDEPENDENT_AMBULATORY_CARE_PROVIDER_SITE_OTHER)
Admission: RE | Admit: 2014-08-04 | Discharge: 2014-08-04 | Disposition: A | Discharge status: Home / Self Care | Payer: Medicare Other | Source: Ambulatory Visit | Attending: Internal Medicine | Admitting: Internal Medicine

## 2014-08-04 ENCOUNTER — Encounter: Payer: Self-pay | Admitting: Internal Medicine

## 2014-08-04 VITALS — BP 122/78 | HR 93 | Temp 98.3°F | Wt 242.0 lb

## 2014-08-04 DIAGNOSIS — M25522 Pain in left elbow: Secondary | ICD-10-CM

## 2014-08-04 NOTE — Patient Instructions (Signed)
X-ray today

## 2014-08-04 NOTE — Progress Notes (Signed)
Subjective:    Patient ID: Joshua [name], male    DOB: 1955/10/27, 59 y.o.   MRN: [id number]  HPI  Pt presents to the clinic today with c/o left elbow pain. He reports this started 1 week ago. He describes the pain as achy, burning and throbbing. He can hear a "popping" sound anytime he moves his arm. He denies numbness and tingling in the arm. He has tried heat and ice without relief. He has not taken anything OTC. He reports he did have an xray on this elbow last year [place] was [place]. They told him at that time that he had a floating bone fragment in it that needed to be removed.  Review of Systems      Past Medical History  Diagnosis Date  . Hypertension   . COPD (chronic obstructive pulmonary disease)   . Spinal disease   . Allergy   . Positive TB test   . Phlebitis   . Shortness of breath   . GERD (gastroesophageal reflux disease)   . Arthritis     Current Outpatient Prescriptions  Medication Sig Dispense Refill  . beclomethasone (QVAR) 80 MCG/ACT inhaler Inhale 1 puff into the lungs 2 (two) times daily as needed. Shortness of breath    . cetirizine (ZYRTEC ALLERGY) 10 MG tablet Take 1 tablet (10 mg total) by mouth daily. 30 tablet 1  . cloNIDine (CATAPRES) 0.3 MG tablet Take 1 tablet (0.3 mg total) by mouth 2 (two) times daily. 60 tablet 3  . gabapentin (NEURONTIN) 100 MG capsule Take 1 capsule (100 mg total) by mouth 3 (three) times daily. 90 capsule 1  . hydrochlorothiazide (HYDRODIURIL) 25 MG tablet Take 1 tablet (25 mg total) by mouth daily. 30 tablet 3  . HYDROcodone-acetaminophen (NORCO) 10-325 MG per tablet Take 1 tablet by mouth every 8 (eight) hours as needed for pain. (Patient not taking: Reported on 07/27/2014) 90 tablet 0  . [DISCONTINUED] lisinopril (PRINIVIL,ZESTRIL) 40 MG tablet TAKE 1 TABLET (40 MG TOTAL) BY MOUTH DAILY. 90 tablet 1   No current facility-administered medications for this visit.    Allergies  Allergen Reactions  . Lisinopril  Anaphylaxis  . Contrast Media [Iodinated Diagnostic Agents] Hives and Itching  . Ibuprofen Other (See Comments)    Internal bleeding    Family History  Problem Relation Age of Onset  . Cancer Mother     stomach  . Hypertension Mother   . Hypertension Father   . Stroke Father   . Diabetes Neg Hx   . Early death Neg Hx     History   Social History  . Marital Status: Widowed    Spouse Name: N/A  . Number of Children: N/A  . Years of Education: 7   Occupational History  . Disabled    Social History Main Topics  . Smoking status: Current Every Day Smoker -- 0.50 packs/day for 44 years    Types: Cigarettes  . Smokeless tobacco: Never Used  . Alcohol Use: No  . Drug Use: No  . Sexual Activity: Yes   Other Topics Concern  . Not on file   Social History Narrative   Regular exercise-no   Caffeine Use-yes     Constitutional: Denies fever, malaise, fatigue, headache or abrupt weight changes.  Respiratory: Denies difficulty breathing, shortness of breath, cough or sputum production.   Cardiovascular: Denies chest pain, chest tightness, palpitations or swelling in the hands or feet.  Musculoskeletal: Pt reports left elbow pain.  Denies difficulty with gait, muscle pain or joint swelling.  Skin: Denies redness, rashes, lesions or ulcercations.    No other specific complaints in a complete review of systems (except as listed in HPI above).  Objective:   Physical Exam  BP 122/78 mmHg  Pulse 93  Temp(Src) 98.3 F (36.8 C) (Oral)  Wt 242 lb (109.77 kg)  SpO2 96%  Wt Readings from Last 3 Encounters:  08/04/14 242 lb (109.77 kg)  07/27/14 244 lb (110.678 kg)  09/08/12 192 lb 2 oz (87.147 kg)    General: Appears his stated age, well developed, well nourished in NAD. Skin: Warm, dry and intact. No redness or warmth to left elbow. Cardiovascular: Normal rate and rhythm. S1,S2 noted.  No murmur, rubs or gallops noted.  Pulmonary/Chest: Normal effort and positive  vesicular breath sounds. No respiratory distress. No wheezes, rales or ronchi noted.  Musculoskeletal: Normal flexion and rotation of the left elbow. Decreased extension actively and passively. No signs of joint effusion. Grip strength equal. Neurological: Alert and oriented. Sensation intact to BUE.  BMET    Component Value Date/Time   NA 136 07/27/2014 1410   K 3.6 07/27/2014 1410   CL 98 07/27/2014 1410   CO2 30 07/27/2014 1410   GLUCOSE 102* 07/27/2014 1410   BUN 13 07/27/2014 1410   CREATININE 0.98 07/27/2014 1410   CREATININE 1.08 11/20/2011 1514   CALCIUM 9.3 07/27/2014 1410   GFRNONAA >90 10/01/2012 1030   GFRAA >90 10/01/2012 1030    Lipid Panel  No results found for: CHOL, TRIG, HDL, CHOLHDL, VLDL, LDLCALC  CBC    Component Value Date/Time   WBC 13.5* 07/27/2014 1410   RBC 5.69 07/27/2014 1410   HGB 14.9 07/27/2014 1410   HCT 44.5 07/27/2014 1410   PLT 370.0 07/27/2014 1410   MCV 78.3 07/27/2014 1410   MCH 31.0 10/01/2012 1525   MCHC 33.6 07/27/2014 1410   RDW 15.9* 07/27/2014 1410   LYMPHSABS 3.7 08/26/2012 0855   MONOABS 1.3* 08/26/2012 0855   EOSABS 0.3 08/26/2012 0855   BASOSABS 0.1 08/26/2012 0855    Hgb A1C Lab Results  Component Value Date   HGBA1C 5.1 11/20/2011        Assessment & Plan:   Left elbow pain:  He prefers to have the left elbow xray today as oppossed to requesting the records If needed, will place referral to ortho Continue heat, ice and OTC pain meds  Will follow up with you after the xray, RTC as needed

## 2014-08-04 NOTE — Progress Notes (Signed)
Pre visit review using our clinic review tool, if applicable. No additional management support is needed unless otherwise documented below in the visit note. 

## 2014-08-05 ENCOUNTER — Telehealth: Payer: Self-pay

## 2014-08-05 NOTE — Telephone Encounter (Signed)
V/M left requesting cb; that was entire message. Left v/m requesting pt to return call.

## 2014-08-05 NOTE — Telephone Encounter (Signed)
Pt request cb to pts wife with xray results of left elbow when available.Please advise. Webb Silversmith NP out of office; note sent to Allie Bossier NP.

## 2014-08-05 NOTE — Telephone Encounter (Signed)
Notified patient's wife of results. Encouraged to continue ibuprofen and application of heat/ice and ibuprofen as needed and to call us if the pain becomes worse. She verbalized understanding.

## 2014-08-09 ENCOUNTER — Other Ambulatory Visit: Payer: Self-pay | Admitting: Internal Medicine

## 2014-08-09 ENCOUNTER — Telehealth: Payer: Self-pay | Admitting: Internal Medicine

## 2014-08-09 DIAGNOSIS — M25522 Pain in left elbow: Secondary | ICD-10-CM

## 2014-08-09 NOTE — Telephone Encounter (Signed)
Pt called to say he prefers an appt with an orthopedic doctor.  Please return call to home number, thanks

## 2014-08-09 NOTE — Telephone Encounter (Signed)
Instead of neurosurgery or pain management?

## 2014-08-09 NOTE — Telephone Encounter (Signed)
Please refer to result note--duplicate msg

## 2014-11-14 ENCOUNTER — Encounter: Payer: Self-pay | Admitting: Internal Medicine

## 2014-11-14 ENCOUNTER — Ambulatory Visit (INDEPENDENT_AMBULATORY_CARE_PROVIDER_SITE_OTHER): Payer: Medicare Other | Admitting: Internal Medicine

## 2014-11-14 ENCOUNTER — Other Ambulatory Visit: Payer: Self-pay | Admitting: Internal Medicine

## 2014-11-14 VITALS — BP 126/82 | HR 72 | Temp 98.5°F | Wt 236.0 lb

## 2014-11-14 DIAGNOSIS — R7309 Other abnormal glucose: Secondary | ICD-10-CM | POA: Diagnosis not present

## 2014-11-14 DIAGNOSIS — E119 Type 2 diabetes mellitus without complications: Secondary | ICD-10-CM

## 2014-11-14 DIAGNOSIS — G4700 Insomnia, unspecified: Secondary | ICD-10-CM

## 2014-11-14 DIAGNOSIS — R202 Paresthesia of skin: Secondary | ICD-10-CM | POA: Diagnosis not present

## 2014-11-14 LAB — VITAMIN B12: Vitamin B-12: 343 pg/mL (ref 211–911)

## 2014-11-14 LAB — HEMOGLOBIN A1C: Hgb A1c MFr Bld: 6.5 % (ref 4.6–6.5)

## 2014-11-14 LAB — FOLATE: FOLATE: 7.1 ng/mL (ref >5.9)

## 2014-11-14 MED ORDER — TRAZODONE HCL 50 MG PO TABS: 50.0000 mg | ORAL_TABLET | Freq: Every evening | ORAL | Status: DC | PRN

## 2014-11-14 NOTE — Patient Instructions (Signed)
Insomnia Insomnia is frequent trouble falling and/or staying asleep. Insomnia can be a long term problem or a short term problem. Both are common. Insomnia can be a short term problem when the wakefulness is related to a certain stress or worry. Long term insomnia is often related to ongoing stress during waking hours and/or poor sleeping habits. Overtime, sleep deprivation itself can make the problem worse. Every little thing feels more severe because you are overtired and your ability to cope is decreased. CAUSES   Stress, anxiety, and depression.  Poor sleeping habits.  Distractions such as TV in the bedroom.  Naps close to bedtime.  Engaging in emotionally charged conversations before bed.  Technical reading before sleep.  Alcohol and other sedatives. They may make the problem worse. They can hurt normal sleep patterns and normal dream activity.  Stimulants such as caffeine for several hours prior to bedtime.  Pain syndromes and shortness of breath can cause insomnia.  Exercise late at night.  Changing time zones may cause sleeping problems (jet lag). It is sometimes helpful to have someone observe your sleeping patterns. They should look for periods of not breathing during the night (sleep apnea). They should also look to see how long those periods last. If you live alone or observers are uncertain, you can also be observed at a sleep clinic where your sleep patterns will be professionally monitored. Sleep apnea requires a checkup and treatment. Give your caregivers your medical history. Give your caregivers observations your family has made about your sleep.  SYMPTOMS   Not feeling rested in the morning.  Anxiety and restlessness at bedtime.  Difficulty falling and staying asleep. TREATMENT   Your caregiver may prescribe treatment for an underlying medical disorders. Your caregiver can give advice or help if you are using alcohol or other drugs for self-medication. Treatment  of underlying problems will usually eliminate insomnia problems.  Medications can be prescribed for short time use. They are generally not recommended for lengthy use.  Over-the-counter sleep medicines are not recommended for lengthy use. They can be habit forming.  You can promote easier sleeping by making lifestyle changes such as:  Using relaxation techniques that help with breathing and reduce muscle tension.  Exercising earlier in the day.  Changing your diet and the time of your last meal. No night time snacks.  Establish a regular time to go to bed.  Counseling can help with stressful problems and worry.  Soothing music and white noise may be helpful if there are background noises you cannot remove.  Stop tedious detailed work at least one hour before bedtime. HOME CARE INSTRUCTIONS   Keep a diary. Inform your caregiver about your progress. This includes any medication side effects. See your caregiver regularly. Take note of:  Times when you are asleep.  Times when you are awake during the night.  The quality of your sleep.  How you feel the next day. This information will help your caregiver care for you.  Get out of bed if you are still awake after 15 minutes. Read or do some quiet activity. Keep the lights down. Wait until you feel sleepy and go back to bed.  Keep regular sleeping and waking hours. Avoid naps.  Exercise regularly.  Avoid distractions at bedtime. Distractions include watching television or engaging in any intense or detailed activity like attempting to balance the household checkbook.  Develop a bedtime ritual. Keep a familiar routine of bathing, brushing your teeth, climbing into bed at the same   time each night, listening to soothing music. Routines increase the success of falling to sleep faster.  Use relaxation techniques. This can be using breathing and muscle tension release routines. It can also include visualizing peaceful scenes. You can  also help control troubling or intruding thoughts by keeping your mind occupied with boring or repetitive thoughts like the old concept of counting sheep. You can make it more creative like imagining planting one beautiful flower after another in your backyard garden.  During your day, work to eliminate stress. When this is not possible use some of the previous suggestions to help reduce the anxiety that accompanies stressful situations. MAKE SURE YOU:   Understand these instructions.  Will watch your condition.  Will get help right away if you are not doing well or get worse. Document Released: 03/01/2000 Document Revised: 05/27/2011 Document Reviewed: 04/01/2007 [employer] Patient Information 2015 [employer]. This information is not intended to replace advice given to you by your health care provider. Make sure you discuss any questions you have with your health care provider.  

## 2014-11-14 NOTE — Progress Notes (Signed)
Subjective:    Patient ID: Joshua [name], male    DOB: 1955-05-28, 59 y.o.   MRN: [id number]  HPI  Pt presents to the clinic today with c/o burning pain in both of his feet. This started about 2 years ago but has gotten worse in the last 6 months. The left seems to be worse than the right. He does have associated numbness and tingling in both feet. He feels like this pain is impairing his ability to walk but he is not currently using any assistive devices. He tells me in the past that he had poor circulation in his legs and that he had to a vascular surgery to improve the blood flow to his legs, but that only lasted for about 6 months. He was on low dose Neurontin in the past but took himself off of it, because "it didn't help". He does see pain management now and he takes Oxycodone as prescribed, but he reports it does not help this pain. He does not have diabetes that he is aware of. He denies low back pain at this time.  Insomnia: He reports he has trouble falling asleep and staying asleep. He just can't seem to turn his mind off. He does not feel anxious or depressed. He has tried Ambien 5 mg in the past and reports "it didn't work worth a lick". He wants to know what else he can do to try to get some sleep. His wife tells me that he does not snore. She has not witnessed any apnea events. He does not wake up at night gasping for air or coughing.  Review of Systems      Past Medical History  Diagnosis Date  . Hypertension   . COPD (chronic obstructive pulmonary disease)   . Spinal disease   . Allergy   . Positive TB test   . Phlebitis   . Shortness of breath   . GERD (gastroesophageal reflux disease)   . Arthritis     Current Outpatient Prescriptions  Medication Sig Dispense Refill  . beclomethasone (QVAR) 80 MCG/ACT inhaler Inhale 1 puff into the lungs 2 (two) times daily as needed. Shortness of breath    . cetirizine (ZYRTEC ALLERGY) 10 MG tablet Take 1 tablet (10 mg total)  by mouth daily. 30 tablet 1  . cloNIDine (CATAPRES) 0.3 MG tablet Take 1 tablet (0.3 mg total) by mouth 2 (two) times daily. 60 tablet 3  . hydrochlorothiazide (HYDRODIURIL) 25 MG tablet Take 1 tablet (25 mg total) by mouth daily. 30 tablet 3  . oxyCODONE-acetaminophen (PERCOCET/ROXICET) 5-325 MG per tablet Take 1 tablet by mouth every 8 (eight) hours.  0  . traZODone (DESYREL) 50 MG tablet Take 1 tablet (50 mg total) by mouth at bedtime as needed for sleep. 30 tablet 1  . [DISCONTINUED] lisinopril (PRINIVIL,ZESTRIL) 40 MG tablet TAKE 1 TABLET (40 MG TOTAL) BY MOUTH DAILY. 90 tablet 1   No current facility-administered medications for this visit.    Allergies  Allergen Reactions  . Lisinopril Anaphylaxis  . Contrast Media [Iodinated Diagnostic Agents] Hives and Itching  . Ibuprofen Other (See Comments)    Internal bleeding    Family History  Problem Relation Age of Onset  . Cancer Mother     stomach  . Hypertension Mother   . Hypertension Father   . Stroke Father   . Diabetes Neg Hx   . Early death Neg Hx     Social History   Social History  .  Marital Status: Widowed    Spouse Name: N/A  . Number of Children: N/A  . Years of Education: 7   Occupational History  . Disabled    Social History Main Topics  . Smoking status: Current Every Day Smoker -- 0.33 packs/day for 44 years    Types: Cigarettes  . Smokeless tobacco: Never Used  . Alcohol Use: No  . Drug Use: No  . Sexual Activity: Yes   Other Topics Concern  . Not on file   Social History Narrative   Regular exercise-no   Caffeine Use-yes     Constitutional: Denies fever, malaise, fatigue, headache or abrupt weight changes.  Respiratory: Denies difficulty breathing, shortness of breath, cough or sputum production.   Cardiovascular: Denies chest pain, chest tightness, palpitations or swelling in the hands or feet.  Musculoskeletal: Pt reports difficulty with gait. Denies decrease in range of motion, muscle  pain or joint pain and swelling.  Skin: Denies redness, rashes, lesions or ulcercations.  Neurological: Pt reports numbness, tingling and burning sensation in feet. Denies dizziness, difficulty with memory, difficulty with speech or problems with coordination.   No other specific complaints in a complete review of systems (except as listed in HPI above).  Objective:   Physical Exam  BP 126/82 mmHg  Pulse 72  Temp(Src) 98.5 F (36.9 C) (Oral)  Wt 236 lb (107.049 kg)  SpO2 98% Wt Readings from Last 3 Encounters:  11/14/14 236 lb (107.049 kg)  08/04/14 242 lb (109.77 kg)  07/27/14 244 lb (110.678 kg)    General: Appears his stated age, obese in NAD. Skin: Warm, dry and intact. Dependant rubor noted to BLE. Cardiovascular: Normal rate and rhythm. S1,S2 noted. Unable to feel pedal or posterior tibialis pulses bilaterally. Cap refill about 5 secs in both feet. Pulmonary/Chest: Normal effort and positive vesicular breath sounds. No respiratory distress. No wheezes, rales or ronchi noted.  Musculoskeletal: Normal flexion, extension and rotation of bilateral ankles. No joint swelling noted. Strength 4/5 RLE, 3/5 LLE. He has a shuffled gait. Neurological: Alert and oriented. Decreased sensation to 10 gm monofilament in bilateral feet.   BMET    Component Value Date/Time   NA 136 07/27/2014 1410   K 3.6 07/27/2014 1410   CL 98 07/27/2014 1410   CO2 30 07/27/2014 1410   GLUCOSE 102* 07/27/2014 1410   BUN 13 07/27/2014 1410   CREATININE 0.98 07/27/2014 1410   CREATININE 1.08 11/20/2011 1514   CALCIUM 9.3 07/27/2014 1410   GFRNONAA >90 10/01/2012 1030   GFRAA >90 10/01/2012 1030    Lipid Panel  No results found for: CHOL, TRIG, HDL, CHOLHDL, VLDL, LDLCALC  CBC    Component Value Date/Time   WBC 13.5* 07/27/2014 1410   RBC 5.69 07/27/2014 1410   HGB 14.9 07/27/2014 1410   HCT 44.5 07/27/2014 1410   PLT 370.0 07/27/2014 1410   MCV 78.3 07/27/2014 1410   MCH 31.0 10/01/2012  1525   MCHC 33.6 07/27/2014 1410   RDW 15.9* 07/27/2014 1410   LYMPHSABS 3.7 08/26/2012 0855   MONOABS 1.3* 08/26/2012 0855   EOSABS 0.3 08/26/2012 0855   BASOSABS 0.1 08/26/2012 0855    Hgb A1C Lab Results  Component Value Date   HGBA1C 5.1 11/20/2011         Assessment & Plan:   Paresthesia of both lower extremities:  ? Vascular versus neurologic Will check A1C, B12, and Folate today Will refer to neurology for EMG testing If EMG shows neuropathy, will discuss with  pain management about restarting Neurontin If EMG is normal, will refer him back to vascular surgery He has a form for me to fill out for a handicap placard, advised him I would call him when it was ready.  Insomnia:  I questioned whether there was a component of OSA given his COPD, HTN and obesity He does not think this is the issue and is refusing workup for this at this time He failed Ambien and does not want to try the 10 mg dose Will try Trazadone, RX provided today If he fails Trazadone, will insist upon OSA workup  RTC as needed or if symptoms persist or worsen

## 2014-11-14 NOTE — Progress Notes (Signed)
Pre visit review using our clinic review tool, if applicable. No additional management support is needed unless otherwise documented below in the visit note. 

## 2014-11-16 NOTE — Addendum Note (Signed)
Addended by: Lurlean Nanny on: 11/16/2014 10:07 AM   Modules accepted: Orders

## 2014-12-16 ENCOUNTER — Other Ambulatory Visit: Payer: Self-pay

## 2014-12-16 DIAGNOSIS — G4700 Insomnia, unspecified: Secondary | ICD-10-CM

## 2014-12-16 MED ORDER — CLONIDINE HCL 0.3 MG PO TABS: 0.3000 mg | ORAL_TABLET | Freq: Two times a day (BID) | ORAL | Status: DC

## 2014-12-16 MED ORDER — TRAZODONE HCL 50 MG PO TABS: 50.0000 mg | ORAL_TABLET | Freq: Every evening | ORAL | Status: DC | PRN

## 2014-12-16 MED ORDER — HYDROCHLOROTHIAZIDE 25 MG PO TABS: 25.0000 mg | ORAL_TABLET | Freq: Every day | ORAL | Status: DC

## 2014-12-26 ENCOUNTER — Encounter: Payer: Self-pay | Admitting: Neurology

## 2014-12-26 ENCOUNTER — Ambulatory Visit (INDEPENDENT_AMBULATORY_CARE_PROVIDER_SITE_OTHER): Payer: Medicare Other | Admitting: Neurology

## 2014-12-26 VITALS — BP 140/88 | HR 67 | Wt 232.3 lb

## 2014-12-26 DIAGNOSIS — E0842 Diabetes mellitus due to underlying condition with diabetic polyneuropathy: Secondary | ICD-10-CM | POA: Diagnosis not present

## 2014-12-26 DIAGNOSIS — F17200 Nicotine dependence, unspecified, uncomplicated: Secondary | ICD-10-CM | POA: Diagnosis not present

## 2014-12-26 DIAGNOSIS — M792 Neuralgia and neuritis, unspecified: Secondary | ICD-10-CM | POA: Diagnosis not present

## 2014-12-26 MED ORDER — GABAPENTIN 300 MG PO CAPS: 300.0000 mg | ORAL_CAPSULE | Freq: Three times a day (TID) | ORAL | Status: DC

## 2014-12-26 NOTE — Progress Notes (Signed)
[facility] Clinic Note - Initial Visit   Date: 12/26/2014  Joshua [name] MRN: [id number] DOB: [birth year]   Dear Webb Silversmith, NP:  Thank you for your kind referral of Joshua [name] for consultation of bilateral feet pain. Although his history is well known to you, please allow [place] to reiterate it for the purpose of our medical record. The patient was accompanied to the clinic by wife who also provides collateral information.     History of Present Illness: Joshua [name] is a 59 y.o. left-handed Caucasian male with diet-controlled diabetes mellitus, hypertension, COPD, tobacco use (trying to quit), chronic back pain s/p cervical and lumbar decompression, and peripheral vascular disease s/p arterial bypass surgery presenting for evaluation of bilateral feet pain.    Over the past 10 years, he began experiencing numbness and tingling of the feet, especially the left first three toes.  Slowly, he also started noticing burning, sharp pain of the feet below the ankles but also has cold sensation.  He denies weakness, but complains of some imbalance.  He takes percocet for his back, which has not made much difference in the burning pain. He has not noticed anything that exacerbates his pain. He has tried gabapentin '100mg'$  three times daily but did not notice any benefit, the dose was never increased.   He has chronic neck and back pain.  He reports having constant numbness over the right lateral hand, which was reported due surgical complications and had this previously tested and was told there was nothing to do.  He sees Dr. Phineas Douglas at [facility].  He has also had peripheral vascular surgery and is not taking antiplatelets due to GI bleeding.   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  He is trying to quit smoking.    Recently diagnosed with diabetes, but this is being managed with diet and lifestyle for now.  Out-side paper  records, electronic medical record, and images have been reviewed where available and summarized as:  MRI cervical spine wo constrast 09/06/2012: 1. Soft disc protrusion at C7-T1 to the right which should affect the right C8 nerve. 2. 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 Small broad-based disc bulge at C3-4.  MRI lumbar spine wo contrast 08/03/2014: 1. Prior posterior decompression L4-5 and L5-S1 without residual spinal stenosis. Mild neural foraminal narrowing bilaterally at L4-5 and on the left at L5-S1. 2. Mild disc and mild-to-moderate facet degeneration elsewhere without stenosis.  Lab Results  Component Value Date   HGBA1C 6.5 11/14/2014   Lab Results  Component Value Date   VITAMINB12 343 11/14/2014     Past Medical History  Diagnosis Date  . Hypertension   . COPD (chronic obstructive pulmonary disease) ([place])   . Spinal disease   . Allergy   . Positive TB test   . Phlebitis   . Shortness of breath   . GERD (gastroesophageal reflux disease)   . Arthritis     Past Surgical History  Procedure Laterality Date  . Arterial bypass surgry    . Cervical spine surgery    . Back surgery    . Anterior cervical decomp/discectomy fusion N/A 10/06/2012    Procedure: ANTERIOR CERVICAL DECOMPRESSION/DISCECTOMY FUSION 1 LEVEL;  Surgeon: [name], MD;  Location: [facility];  Service: Neurosurgery;  Laterality: N/A;  C7T1 anterior cervical decompression with fusion plating and bonegraft with Stalif-C ([place])     Medications:  Outpatient Encounter  Prescriptions as of 12/26/2014  Medication Sig Note  . cetirizine (ZYRTEC ALLERGY) 10 MG tablet Take 1 tablet (10 mg total) by mouth daily.   . cloNIDine (CATAPRES) 0.3 MG tablet Take 1 tablet (0.3 mg total) by mouth 2 (two) times daily.   [name] gabapentin (NEURONTIN) 300 MG capsule Take 1 capsule (300 mg total) by mouth 3 (three) times daily.   . hydrochlorothiazide (HYDRODIURIL) 25 MG tablet Take 1 tablet (25 mg  total) by mouth daily.   [name] oxyCODONE-acetaminophen (PERCOCET/ROXICET) 5-325 MG per tablet Take 1 tablet by mouth every 8 (eight) hours. 11/14/2014: Received from: External Pharmacy  . traZODone (DESYREL) 50 MG tablet Take 1 tablet (50 mg total) by mouth at bedtime as needed for sleep.   . [DISCONTINUED] beclomethasone (QVAR) 80 MCG/ACT inhaler Inhale 1 puff into the lungs 2 (two) times daily as needed. Shortness of breath    No facility-administered encounter medications on file as of 12/26/2014.     Allergies:  Allergies  Allergen Reactions  . Lisinopril Anaphylaxis  . Contrast Media [Iodinated Diagnostic Agents] Hives and Itching  . Ibuprofen Other (See Comments)    Internal bleeding    Family History: Family History  Problem Relation Age of Onset  . Stomach cancer Mother     Deceased, 22s  . Hypertension Mother   . Hypertension Father   . Stroke Father   . Diabetes Neg Hx   . Early death Neg Hx   . Heart attack Father     Deceased, 33  . Healthy Daughter     Social History: Social History  Substance Use Topics  . Smoking status: Current Every Day Smoker -- 1.00 packs/day for 44 years    Types: Cigarettes  . Smokeless tobacco: Never Used  . 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 Has 2 children.  On disability for low back pain.  Used to work as a Games developer, lasted worked in 2007.     Education: 7th grade.    Review of Systems:  CONSTITUTIONAL: No fevers, chills, night sweats, or weight loss.   EYES: No visual changes or eye pain ENT: No hearing changes.  No history of nose bleeds.   RESPIRATORY: No cough, +wheezing and shortness of breath.   CARDIOVASCULAR: Negative for chest pain, and palpitations.   GI: Negative for abdominal discomfort, blood in stools or black stools.  No recent  change in bowel habits.   GU:  No history of incontinence.   MUSCLOSKELETAL: +history of joint pain or swelling.  No myalgias.   SKIN: Negative for lesions, rash, and itching.   HEMATOLOGY/ONCOLOGY: Negative for prolonged bleeding, bruising easily, and swollen nodes.    ENDOCRINE: Negative for cold or heat intolerance, polydipsia or goiter.   PSYCH:  No depression or anxiety symptoms.   NEURO: As Above.   Vital Signs:  BP 140/88 mmHg  Pulse 67  Wt 232 lb 5 oz (105.376 kg)  SpO2 97%  General Medical Exam:   General:  Well appearing, comfortable.   Eyes/ENT: see cranial nerve examination.   Neck: No masses appreciated.  Full range of motion without tenderness.  No carotid bruits. Respiratory:  Wheezing throughout, good air entry bilaterally.   Cardiac:  Regular rate and rhythm, no murmur.   Extremities:  No deformities, edema, purplish hue of the feet bilaterally.  Skin: Old scars over his right forearm from previous injury.  Neurological Exam: MENTAL STATUS including orientation to time, place, person, recent and remote memory, attention span and concentration, language, and fund of knowledge is normal.  Speech is not dysarthric.  CRANIAL NERVES: II:  No visual field defects.  Unremarkable fundi.   III-IV-VI: Pupils equal round and reactive to light.  Normal conjugate, extra-ocular eye movements in all directions of gaze.  No nystagmus.  No ptosis.   V:  Normal facial sensation.    VII:  Normal facial symmetry and movements.  No pathologic facial reflexes.  VIII:  Normal hearing and vestibular function.   IX-X:  Normal palatal movement.   XI:  Normal shoulder shrug and head rotation.   XII:  Normal tongue strength and range of motion, no deviation or fasciculation.  MOTOR:  No atrophy, fasciculations or abnormal movements.  No pronator drift.  Tone is normal.    Right Upper Extremity:    Left Upper Extremity:    Deltoid  5/5   Deltoid  5/5   Biceps  5/5   Biceps  5/5     Triceps  5/5   Triceps  5/5   Wrist extensors  5/5   Wrist extensors  5/5   Wrist flexors  5/5   Wrist flexors  5/5   Finger extensors  5/5   Finger extensors  5/5   Finger flexors  5/5   Finger flexors  5/5   Dorsal interossei  5/5   Dorsal interossei  5/5   Abductor pollicis  5/5   Abductor pollicis  5/5   Tone (Ashworth scale)  0  Tone (Ashworth scale)  0   Right Lower Extremity:    Left Lower Extremity:    Hip flexors  5/5   Hip flexors  5/5   Hip extensors  5/5   Hip extensors  5/5   Knee flexors  5/5   Knee flexors  5/5   Knee extensors  5/5   Knee extensors  5/5   Dorsiflexors  5/5   Dorsiflexors  5/5   Plantarflexors  5/5   Plantarflexors  5/5   Toe extensors  5/5   Toe extensors  5/5   Toe flexors  5/5   Toe flexors  5/5   Tone (Ashworth scale)  0  Tone (Ashworth scale)  0   MSRs:  Right                                                                 Left brachioradialis 2+  brachioradialis 2+  biceps 2+  biceps 2+  triceps 2+  triceps 2+  patellar 2+  patellar 2+  ankle jerk 0  ankle jerk 0  Hoffman no  Hoffman no  plantar response down  plantar response down   SENSORY: Vibration reduced to 25% at ankles and absent at great toe bilaterally, temperature and pin prick reduced following gradient pattern from mid-calf distal. Romberg's sign absent.   COORDINATION/GAIT: Normal finger-to- nose-finger.  Intact rapid alternating movements bilaterally. Gait is wide-based due to body habitus and stable.  He is unable to stand on heels or toes.  Too unsteady with tandem gait.  IMPRESSION: Joshua [name] is a 59 year-old gentleman presenting for evaluation of bilateral feet paresthesias.  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 I had extensive discussion with the patient regarding the pathogenesis, etiology, management, and natural course of neuropathy. He has several risk factors for neuropathy including:  diabetes,  history of alcohol abuse, and peripheral vascular disease.  I strongly encouraged him to keep sugars under control with diet and exercise and urged him to quit smoking to minimize the progression of his neuropathy.  He was previously managed on suboptimal dose of gabapentin, so will try to maximize the benefits with his medication, prior to trying alternatives.   PLAN/RECOMMENDATIONS:  1.  Start gabapentin '300mg'$  three times daily, he will call with update in 1 month to determine further titration 2.  Strongly encouraged to stop smoking especially with vascular risk factors.  He is unable to take antiplatelets due to GI bleeding 3.  Encouraged him to continue staying active  4.  Consider peripheral vascular studies going forward, he needs to be establish with vascular specialist locally  Return to clinic in 3 months.   The duration of this appointment visit was 45 minutes of face-to-face time with the patient.  Greater than 50% of this time was spent in counseling, explanation of diagnosis, planning of further management, and coordination of care.   Thank you for allowing me to participate in patient's care.  If I can answer any additional questions, I would be pleased to do so.    Sincerely,    Donika K. Posey Pronto, DO

## 2014-12-26 NOTE — Patient Instructions (Signed)
1.  Gabapentin 300 mg tablets    Morning       Afternoon        Evening  Week 1                                  1 tab              Week 2 1 tab                   1 tab              Continue  1 tab          1 tab            1 tab         Call with update in 1 month, to determine further increase in medication.  If you develop increased sleepiness, stay at the lower dose.       2.  Strongly to stop smoking and praised for staying active  3.  Return to clinic 47-month

## 2015-01-31 ENCOUNTER — Other Ambulatory Visit (INDEPENDENT_AMBULATORY_CARE_PROVIDER_SITE_OTHER): Payer: Medicare Other

## 2015-01-31 DIAGNOSIS — E119 Type 2 diabetes mellitus without complications: Secondary | ICD-10-CM | POA: Diagnosis not present

## 2015-01-31 LAB — HEMOGLOBIN A1C: HEMOGLOBIN A1C: 6.2 % (ref 4.6–6.5)

## 2015-02-01 ENCOUNTER — Telehealth: Payer: Self-pay | Admitting: Internal Medicine

## 2015-02-01 ENCOUNTER — Encounter: Payer: Self-pay | Admitting: *Deleted

## 2015-02-01 NOTE — Telephone Encounter (Signed)
Form filled out for back brace. I have not see him about knee brace, therefore I could not fill out that part of the form. Form in MYD box.

## 2015-02-01 NOTE — Telephone Encounter (Signed)
Pt called and wanted to make [facility] aware that a rep will be contacting her regarding a back brace. If you have any questions the best number to contact the pt is [phone].

## 2015-02-15 ENCOUNTER — Other Ambulatory Visit: Payer: Medicare Other

## 2015-02-27 ENCOUNTER — Telehealth: Payer: Self-pay

## 2015-02-27 NOTE — Telephone Encounter (Signed)
Pt did not see a provider over the weekend and pt said this morning his rt foot is not hurting and will cb if needed.

## 2015-02-27 NOTE — Telephone Encounter (Signed)
PLEASE NOTE: All timestamps contained within this report are represented as [place] Standard Time. CONFIDENTIALTY NOTICE: This fax transmission is intended only for the addressee. It contains information that is legally privileged, confidential or otherwise protected from use or disclosure. If you are not the intended recipient, you are strictly prohibited from reviewing, disclosing, copying using or disseminating any of this information or taking any action in reliance on or regarding this information. If you have received this fax in error, please notify us immediately by telephone so that we can arrange for its return to [place]. Phone: [phone], Toll-Free: [phone], Fax: [phone] Page: 1 of 2 Call Id: [id number] [place] Patient Name: Joshua [name] Gender: Male DOB: Aug 06, 1955 Age: 59 Y 1 D Return Phone Number: [phone] (Primary) Address: City/State/Zip: [place] Client [place] Night - Client Client Site [facility] [address] [place] Type Fax Call Type Triage / Clinical Caller Name Boyd Relationship To Patient Self Return Phone Number [phone] (Primary) Chief Complaint Toe Pain Initial Comment Caller states: has gout in toes on rt foot. *Remade from CID [id number]* PreDisposition Call another nurse Nurse Assessment Nurse: Jimmye Norman, RN, Museum/gallery conservator Date/Time (Eastern Time): 02/24/2015 9:40:00 PM Confirm and document reason for call. If symptomatic, describe symptoms. ---Caller states that he is having pain in his Right great toe. It is gout. It is swollen and red. Has the patient traveled out of the country within the last 30 days? ---No Does the patient have any new or worsening symptoms? ---Yes Will a triage be completed? ---Yes Related visit to physician within the last 2 weeks? ---No Does the PT have any  chronic conditions? (i.e. diabetes, asthma, etc.) ---Yes List chronic conditions. ---Gout, diabetes, htn Is this a behavioral health or substance abuse call? ---No Guidelines Guideline Title Affirmed Question Affirmed Notes Nurse Date/Time Eilene Ghazi Time) Toe Pain [1] SEVERE pain (e.g., excruciating, unable to do any normal activities) AND [2] not improved after 2 hours of pain medicine Jimmye Norman, RN, Amber 02/24/2015 9:41:51 PM Disp. Time Eilene Ghazi Time) Disposition Final User 02/24/2015 9:31:53 PM Send To RN Personal Dorene Sorrow 02/24/2015 9:44:32 PM See Physician within 4 Hours (or PCP triage) Yes Jimmye Norman, RN, Amber PLEASE NOTE: All timestamps contained within this report are represented as [place] Standard Time. CONFIDENTIALTY NOTICE: This fax transmission is intended only for the addressee. It contains information that is legally privileged, confidential or otherwise protected from use or disclosure. If you are not the intended recipient, you are strictly prohibited from reviewing, disclosing, copying using or disseminating any of this information or taking any action in reliance on or regarding this information. If you have received this fax in error, please notify us immediately by telephone so that we can arrange for its return to [place]. Phone: [phone], Toll-Free: [phone], Fax: [phone] Page: 2 of 2 Call Id: [id number] Caller Understands: Yes Disagree/Comply: Comply Care Advice Given Per Guideline SEE PHYSICIAN WITHIN 4 HOURS (or PCP triage): * IF OFFICE WILL BE OPEN: You need to be seen within the next 3 or 4 hours. Call your doctor's office now or as soon as it opens. * IF OFFICE WILL BE CLOSED AND NO PCP TRIAGE: You need to be seen within the next 3 or 4 hours. A nearby Urgent Care Center is often a good source of care. Another choice is to go to the ER. Go sooner if you become worse. CALL BACK  IF: * You become worse. CARE ADVICE given per Toe Pain (Adult)  guideline. caller is on prescription pain medication, advised to take as prescribed. Have another adult drive. take a list of current medications After Care Instructions Given Call Event Type User Date / Time Description Comments User: Genene Churn, RN Date/Time Eilene Ghazi Time): 02/24/2015 9:43:03 PM pain is 10 out of 10 Referrals [facility] - ED

## 2015-03-21 ENCOUNTER — Ambulatory Visit (INDEPENDENT_AMBULATORY_CARE_PROVIDER_SITE_OTHER)
Admission: RE | Admit: 2015-03-21 | Discharge: 2015-03-21 | Disposition: A | Discharge status: Home / Self Care | Payer: Medicare Other | Source: Ambulatory Visit | Attending: Internal Medicine | Admitting: Internal Medicine

## 2015-03-21 ENCOUNTER — Ambulatory Visit (INDEPENDENT_AMBULATORY_CARE_PROVIDER_SITE_OTHER): Payer: Medicare Other | Admitting: Internal Medicine

## 2015-03-21 ENCOUNTER — Encounter: Payer: Self-pay | Admitting: Internal Medicine

## 2015-03-21 VITALS — BP 142/84 | HR 69 | Temp 98.2°F | Wt 233.0 lb

## 2015-03-21 DIAGNOSIS — G4700 Insomnia, unspecified: Secondary | ICD-10-CM

## 2015-03-21 DIAGNOSIS — K219 Gastro-esophageal reflux disease without esophagitis: Secondary | ICD-10-CM

## 2015-03-21 DIAGNOSIS — J449 Chronic obstructive pulmonary disease, unspecified: Secondary | ICD-10-CM

## 2015-03-21 DIAGNOSIS — M25519 Pain in unspecified shoulder: Secondary | ICD-10-CM | POA: Diagnosis not present

## 2015-03-21 DIAGNOSIS — M5010 Cervical disc disorder with radiculopathy, unspecified cervical region: Secondary | ICD-10-CM | POA: Diagnosis not present

## 2015-03-21 DIAGNOSIS — I10 Essential (primary) hypertension: Secondary | ICD-10-CM

## 2015-03-21 MED ORDER — OMEPRAZOLE 20 MG PO CPDR: 20.0000 mg | DELAYED_RELEASE_CAPSULE | Freq: Every day | ORAL | Status: DC

## 2015-03-21 MED ORDER — GABAPENTIN 800 MG PO TABS: 800.0000 mg | ORAL_TABLET | Freq: Three times a day (TID) | ORAL | Status: DC

## 2015-03-21 MED ORDER — ALBUTEROL SULFATE HFA 108 (90 BASE) MCG/ACT IN AERS: 2.0000 | INHALATION_SPRAY | Freq: Four times a day (QID) | RESPIRATORY_TRACT | Status: DC | PRN

## 2015-03-21 MED ORDER — HYDROCHLOROTHIAZIDE 25 MG PO TABS: 25.0000 mg | ORAL_TABLET | Freq: Every day | ORAL | Status: DC

## 2015-03-21 MED ORDER — CETIRIZINE HCL 10 MG PO TABS: 10.0000 mg | ORAL_TABLET | Freq: Every day | ORAL | Status: DC

## 2015-03-21 MED ORDER — BECLOMETHASONE DIPROPIONATE 80 MCG/ACT IN AERS: 2.0000 | INHALATION_SPRAY | Freq: Every day | RESPIRATORY_TRACT | Status: DC

## 2015-03-21 MED ORDER — CLONIDINE HCL 0.3 MG PO TABS: 0.3000 mg | ORAL_TABLET | Freq: Two times a day (BID) | ORAL | Status: DC

## 2015-03-21 MED ORDER — TRAZODONE HCL 50 MG PO TABS: 50.0000 mg | ORAL_TABLET | Freq: Every evening | ORAL | Status: DC | PRN

## 2015-03-21 NOTE — Patient Instructions (Signed)
Smoking Cessation, Tips for Success If you are ready to quit smoking, congratulations! You have chosen to help yourself be healthier. Cigarettes bring nicotine, tar, carbon monoxide, and other irritants into your body. Your lungs, heart, and blood vessels will be able to work better without these poisons. There are many different ways to quit smoking. Nicotine gum, nicotine patches, a nicotine inhaler, or nicotine nasal spray can help with physical craving. Hypnosis, support groups, and medicines help break the habit of smoking. WHAT THINGS CAN I DO TO MAKE QUITTING EASIER?  Here are some tips to help you quit for good:  Pick a date when you will quit smoking completely. Tell all of your friends and family about your plan to quit on that date.  Do not try to slowly cut down on the number of cigarettes you are smoking. Pick a quit date and quit smoking completely starting on that day.  Throw away all cigarettes.   Clean and remove all ashtrays from your home, work, and car.  On a card, write down your reasons for quitting. Carry the card with you and read it when you get the urge to smoke.  Cleanse your body of nicotine. Drink enough water and fluids to keep your urine clear or pale yellow. Do this after quitting to flush the nicotine from your body.  Learn to predict your moods. Do not let a bad situation be your excuse to have a cigarette. Some situations in your life might tempt you into wanting a cigarette.  Never have "just one" cigarette. It leads to wanting another and another. Remind yourself of your decision to quit.  Change habits associated with smoking. If you smoked while driving or when feeling stressed, try other activities to replace smoking. Stand up when drinking your coffee. Brush your teeth after eating. Sit in a different chair when you read the paper. Avoid alcohol while trying to quit, and try to drink fewer caffeinated beverages. Alcohol and caffeine may urge you to  smoke.  Avoid foods and drinks that can trigger a desire to smoke, such as sugary or spicy foods and alcohol.  Ask people who smoke not to smoke around you.  Have something planned to do right after eating or having a cup of coffee. For example, plan to take a walk or exercise.  Try a relaxation exercise to calm you down and decrease your stress. Remember, you may be tense and nervous for the first 2 weeks after you quit, but this will pass.  Find new activities to keep your hands busy. Play with a pen, coin, or rubber band. Doodle or draw things on paper.  Brush your teeth right after eating. This will help cut down on the craving for the taste of tobacco after meals. You can also try mouthwash.   Use oral substitutes in place of cigarettes. Try using lemon drops, carrots, cinnamon sticks, or chewing gum. Keep them handy so they are available when you have the urge to smoke.  When you have the urge to smoke, try deep breathing.  Designate your home as a nonsmoking area.  If you are a heavy smoker, ask your health care provider about a prescription for nicotine chewing gum. It can ease your withdrawal from nicotine.  Reward yourself. Set aside the cigarette money you save and buy yourself something nice.  Look for support from others. Join a support group or smoking cessation program. Ask someone at home or at work to help you with your plan   to quit smoking.  Always ask yourself, "Do I need this cigarette or is this just a reflex?" Tell yourself, "Today, I choose not to smoke," or "I do not want to smoke." You are reminding yourself of your decision to quit.  Do not replace cigarette smoking with electronic cigarettes (commonly called e-cigarettes). The safety of e-cigarettes is unknown, and some may contain harmful chemicals.  If you relapse, do not give up! Plan ahead and think about what you will do the next time you get the urge to smoke. HOW WILL I FEEL WHEN I QUIT SMOKING? You  may have symptoms of withdrawal because your body is used to nicotine (the addictive substance in cigarettes). You may crave cigarettes, be irritable, feel very hungry, cough often, get headaches, or have difficulty concentrating. The withdrawal symptoms are only temporary. They are strongest when you first quit but will go away within 10-14 days. When withdrawal symptoms occur, stay in control. Think about your reasons for quitting. Remind yourself that these are signs that your body is healing and getting used to being without cigarettes. Remember that withdrawal symptoms are easier to treat than the major diseases that smoking can cause.  Even after the withdrawal is over, expect periodic urges to smoke. However, these cravings are generally short lived and will go away whether you smoke or not. Do not smoke! WHAT RESOURCES ARE AVAILABLE TO HELP ME QUIT SMOKING? Your health care provider can direct you to community resources or hospitals for support, which may include:  Group support.  Education.  Hypnosis.  Therapy.   This information is not intended to replace advice given to you by your health care provider. Make sure you discuss any questions you have with your health care provider.   Document Released: 12/01/2003 Document Revised: 03/25/2014 Document Reviewed: 08/20/2012 Elsevier Interactive Patient Education 2016 [employer].  

## 2015-03-21 NOTE — Assessment & Plan Note (Signed)
Will repeat xray of cervical spine today He will continue to follow with pain management Increase Gabapentin to 800 mg TID

## 2015-03-21 NOTE — Assessment & Plan Note (Signed)
Will start Prilosec 20 mg daily Discussed how weight loss could help improve his symptoms He will let me know if difficulty swallowing does not improve

## 2015-03-21 NOTE — Assessment & Plan Note (Signed)
He will continue Trazadone for now

## 2015-03-21 NOTE — Assessment & Plan Note (Signed)
Encouraged him to stop smoking RX given for QVAR to take daily RX given for Albuterol to take as needed

## 2015-03-21 NOTE — Progress Notes (Signed)
Subjective:    Patient ID: Joshua [name], male    DOB: 1955-12-30, 60 y.o.   MRN: [id number]  HPI  Pt presents to the clinic today to follow up chronic conditions.   Chronic back/neck pain: He is complaining of increased neck pain x 3 month which radiates down the back of both arms. He has has neck surgery x 4. He has not had a cervical xray since 2014.He describes the pain as a burning/tingling sensation. He is following with pain management. They have him on Oxycodone. He is also on Gabapentin and would like to see if it can be increased.   HTN: He is taking HCTZ and Clonidine as prescribed. His BP today is 142/84. He denies headache, chest pain, chest tightness, SOB at this time.   COPD: He has continued smoking and states that he is not interested in quitting at this time. He has had some SOB when he is up walking around but no limitations in activity. He states that he has not been using the QVAR because it is too expensive. He does request an RX for an Albuterol inhaler.  Insomnia: He is having trouble staying asleep.  He is taking Trazadone a few times per week.  GERD: He is not sure what his triggers are. He is having trouble swallowing, even water at times. He has noticed swelling in his throat. He has tried Zantac and Nexium in the past. He feels like the Nexium was more effective.  Review of Systems      Past Medical History  Diagnosis Date  . Hypertension   . COPD (chronic obstructive pulmonary disease) ([place])   . Spinal disease   . Allergy   . Positive TB test   . Phlebitis   . Shortness of breath   . GERD (gastroesophageal reflux disease)   . Arthritis     Current Outpatient Prescriptions  Medication Sig Dispense Refill  . cetirizine (ZYRTEC ALLERGY) 10 MG tablet Take 1 tablet (10 mg total) by mouth daily. 30 tablet 1  . cloNIDine (CATAPRES) 0.3 MG tablet Take 1 tablet (0.3 mg total) by mouth 2 (two) times daily. 180 tablet 1  . gabapentin (NEURONTIN) 300 MG  capsule Take 600 mg by mouth 3 (three) times daily.    . hydrochlorothiazide (HYDRODIURIL) 25 MG tablet Take 1 tablet (25 mg total) by mouth daily. 90 tablet 1  . oxyCODONE-acetaminophen (PERCOCET) 10-325 MG tablet Take 1 tablet by mouth every 8 (eight) hours.  0  . traZODone (DESYREL) 50 MG tablet Take 1 tablet (50 mg total) by mouth at bedtime as needed for sleep. 90 tablet 0  . [DISCONTINUED] lisinopril (PRINIVIL,ZESTRIL) 40 MG tablet TAKE 1 TABLET (40 MG TOTAL) BY MOUTH DAILY. 90 tablet 1   No current facility-administered medications for this visit.    Allergies  Allergen Reactions  . Lisinopril Anaphylaxis  . Contrast Media [Iodinated Diagnostic Agents] Hives and Itching  . Ibuprofen Other (See Comments)    Internal bleeding    Family History  Problem Relation Age of Onset  . Stomach cancer Mother     Deceased, 37s  . Hypertension Mother   . Hypertension Father   . Stroke Father   . Diabetes Neg Hx   . Early death Neg Hx   . Heart attack Father     Deceased, 29  . Healthy Daughter     Social History   Social History  . Marital Status: Widowed    Spouse Name: N/A  .  Number of Children: N/A  . Years of Education: 7   Occupational History  . Disabled    Social History Main Topics  . Smoking status: Current Every Day Smoker -- 1.00 packs/day for 44 years    Types: Cigarettes  . Smokeless tobacco: Never Used  . 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 Drug Use: No  . Sexual Activity: Yes   Other Topics Concern  . Not on file   Social History Narrative   Regular exercise-no   Caffeine Use-yes   Lives with wife in a one story home. Has 2 children.  On disability for low back pain.  Used to work as a Games developer, lasted worked in 2007.     Education: 7th grade.     Constitutional: Denies fever, malaise, fatigue, headache or abrupt weight changes.  Respiratory: Pt reports occasional shortness of breath.  Denies difficulty breathing, cough or sputum production.   Cardiovascular: Denies chest pain, chest tightness, palpitations or swelling in the hands or feet. Gastrointestinal: Pt reports reflux. Denies abdominal pain, diarrhea, or blood in her stool. Musculoskeletal: Pt reports neck pain. Denies muscle pain or joint swelling.  Skin: Denies redness, rashes, lesions or ulcercations.  Neurological: Denies dizziness, difficulty with memory, difficulty with speech or problems with balance and coordination.   No other specific complaints in a complete review of systems (except as listed in HPI above).  Objective:   Physical Exam  BP 142/84 mmHg  Pulse 69  Temp(Src) 98.2 F (36.8 C) (Oral)  Wt 233 lb (105.688 kg)  SpO2 98% Wt Readings from Last 3 Encounters:  03/21/15 233 lb (105.688 kg)  12/26/14 232 lb 5 oz (105.376 kg)  11/14/14 236 lb (107.049 kg)    General: Appears his stated age, obese in NAD. Skin: Warm, dry and intact. No rashes, lesions or ulcerations noted. HEENT: Head: normal shape and size; Eyes: sclera white, no icterus, conjunctiva pink, PERRLA and EOMs intact;  Cardiovascular: Normal rate and rhythm. S1,S2 noted.  No murmur, rubs or gallops noted.  Pulmonary/Chest: Normal effort with bilateral inspiratory and expiratory wheezes. No respiratory distress. No rales or ronchi noted.  Musculoskeletal: Decreased flexion, extension and rotation secondary to pain. Pain with palpation over the cervical spine. Strength 5/5 BLE. Neurological: Alert and oriented. Sensation intact to BLE.   BMET    Component Value Date/Time   NA 136 07/27/2014 1410   K 3.6 07/27/2014 1410   CL 98 07/27/2014 1410   CO2 30 07/27/2014 1410   GLUCOSE 102* 07/27/2014 1410   BUN 13 07/27/2014 1410   CREATININE 0.98 07/27/2014 1410   CREATININE 1.08 11/20/2011 1514   CALCIUM 9.3 07/27/2014 1410   GFRNONAA >90 10/01/2012 1030   GFRAA >90 10/01/2012 1030    Lipid Panel  No results found for: CHOL,  TRIG, HDL, CHOLHDL, VLDL, LDLCALC  CBC    Component Value Date/Time   WBC 13.5* 07/27/2014 1410   RBC 5.69 07/27/2014 1410   HGB 14.9 07/27/2014 1410   HCT 44.5 07/27/2014 1410   PLT 370.0 07/27/2014 1410   MCV 78.3 07/27/2014 1410   MCH 31.0 10/01/2012 1525   MCHC 33.6 07/27/2014 1410   RDW 15.9* 07/27/2014 1410   LYMPHSABS 3.7 08/26/2012 0855   MONOABS 1.3* 08/26/2012 0855   EOSABS 0.3 08/26/2012 0855   BASOSABS 0.1 08/26/2012 0855    Hgb A1C Lab Results  Component Value Date  HGBA1C 6.2 01/31/2015         Assessment & Plan:

## 2015-03-21 NOTE — Assessment & Plan Note (Signed)
BP slightly elevated today but he feels like this is because he is in pain Continue HCTZ and Clonidine for now Will continue to monitor

## 2015-03-22 ENCOUNTER — Other Ambulatory Visit: Payer: Self-pay

## 2015-03-22 ENCOUNTER — Other Ambulatory Visit: Payer: Self-pay | Admitting: Internal Medicine

## 2015-03-22 MED ORDER — ALBUTEROL SULFATE HFA 108 (90 BASE) MCG/ACT IN AERS: 2.0000 | INHALATION_SPRAY | Freq: Four times a day (QID) | RESPIRATORY_TRACT | Status: DC | PRN

## 2015-03-22 MED ORDER — BECLOMETHASONE DIPROPIONATE 80 MCG/ACT IN AERS: 2.0000 | INHALATION_SPRAY | Freq: Every day | RESPIRATORY_TRACT | Status: DC

## 2015-03-22 NOTE — Telephone Encounter (Signed)
Pt cannot afford to get albuterol and qvar inhaler # 3 inhalers at one time. Pt request inhalers sent # 1 inhaler with refills to [facility] [address]. Advised pt done.

## 2015-03-23 ENCOUNTER — Telehealth: Payer: Self-pay

## 2015-03-23 DIAGNOSIS — G894 Chronic pain syndrome: Secondary | ICD-10-CM | POA: Diagnosis not present

## 2015-03-23 DIAGNOSIS — G629 Polyneuropathy, unspecified: Secondary | ICD-10-CM | POA: Diagnosis not present

## 2015-03-23 DIAGNOSIS — M79671 Pain in right foot: Secondary | ICD-10-CM | POA: Diagnosis not present

## 2015-03-23 DIAGNOSIS — M542 Cervicalgia: Secondary | ICD-10-CM | POA: Diagnosis not present

## 2015-03-23 DIAGNOSIS — M79672 Pain in left foot: Secondary | ICD-10-CM | POA: Diagnosis not present

## 2015-03-23 MED ORDER — CETIRIZINE HCL 10 MG PO TABS: 10.0000 mg | ORAL_TABLET | Freq: Every day | ORAL | Status: DC

## 2015-03-23 NOTE — Telephone Encounter (Signed)
Pt left v/m requesting 90 day refill cetirizine to [facility] [address]. Spoke with pts wife and she said [facility] called pt and said could not get mail order; pt is presently at [facility] [address] getting OTC zyrtec but pt does not have cell phone. Mrs Gladu said if needed she would have pt cb. i called [facility] [address] and spoke with Minette Brine and pt has already purchased OTC zyrtec and left the store.

## 2015-03-23 NOTE — Telephone Encounter (Addendum)
Pt did not purchase cetirizine OTC because too expensive. [facility] would not fill because OTC. Cetirizine to [facility] [address]. Per pts request.

## 2015-03-23 NOTE — Addendum Note (Signed)
Addended by: Helene Shoe on: 03/23/2015 04:54 PM   Modules accepted: Orders

## 2015-03-24 ENCOUNTER — Telehealth: Payer: Self-pay | Admitting: Internal Medicine

## 2015-03-24 NOTE — Telephone Encounter (Signed)
I have faxed 3 times and it says error---will fax again

## 2015-03-24 NOTE — Telephone Encounter (Signed)
Spoke to [place] at [facility]---i let her know I have been faxing form and notes since 03/19/2014---told her i would refax and mail the original just in case---

## 2015-03-24 NOTE — Telephone Encounter (Signed)
°  Joshua [name] from [facility] called regarding diabetic shoes - they will be faxing the form again for the third time.    Please fill out and fax back as soon as possible, the pt is calling daily to [facility]  Thank you

## 2015-03-27 NOTE — Telephone Encounter (Signed)
Willette Cluster @ [facility] checking on form Fax [phone] email  [email or web address] Phon# [phone] Please advise when you send

## 2015-03-28 NOTE — Telephone Encounter (Signed)
Form has been refaxed and original mailed to [facility]

## 2015-03-29 NOTE — Telephone Encounter (Signed)
Joshua [name] with [facility] called and wanted to ck on status of fax. Advised Joshua [name] form was refaxed to [phone]  And original mailed to [facility]. Joshua [name] verified fax # but has not received form; Joshua [name] request cb from [facility].

## 2015-03-30 ENCOUNTER — Telehealth: Payer: Self-pay | Admitting: Internal Medicine

## 2015-03-30 NOTE — Telephone Encounter (Signed)
Spoke to [place] with [facility]---let her know I have faxed paperwork x 2 more times and went ahead and mailed the original to address on form

## 2015-03-30 NOTE — Telephone Encounter (Signed)
error 

## 2015-03-30 NOTE — Telephone Encounter (Signed)
[name] with [facility] called again today asking about the ppw that she still has not received. I informed her again that it was faxed (confirmed the fax number of [phone]) and the original was mailed as stated below. She asked if Threasa Beards could call her back asap at [phone].

## 2015-04-07 ENCOUNTER — Other Ambulatory Visit: Payer: Self-pay

## 2015-04-07 MED ORDER — ALBUTEROL SULFATE HFA 108 (90 BASE) MCG/ACT IN AERS: INHALATION_SPRAY | RESPIRATORY_TRACT | Status: DC

## 2015-04-07 MED ORDER — FLUTICASONE PROPIONATE HFA 44 MCG/ACT IN AERO: 2.0000 | INHALATION_SPRAY | Freq: Two times a day (BID) | RESPIRATORY_TRACT | Status: DC

## 2015-04-07 NOTE — Telephone Encounter (Signed)
Received fax from pt's insurance stating that Qvar will no longer be covered and needs to be changed---per regina Baity change to Flovent BID 84mg and change Proventil to [employer]

## 2015-04-14 ENCOUNTER — Ambulatory Visit: Payer: Medicare Other | Admitting: Neurology

## 2015-04-20 DIAGNOSIS — M79672 Pain in left foot: Secondary | ICD-10-CM | POA: Diagnosis not present

## 2015-04-20 DIAGNOSIS — G894 Chronic pain syndrome: Secondary | ICD-10-CM | POA: Diagnosis not present

## 2015-04-20 DIAGNOSIS — G629 Polyneuropathy, unspecified: Secondary | ICD-10-CM | POA: Diagnosis not present

## 2015-04-20 DIAGNOSIS — M542 Cervicalgia: Secondary | ICD-10-CM | POA: Diagnosis not present

## 2015-04-20 DIAGNOSIS — M79671 Pain in right foot: Secondary | ICD-10-CM | POA: Diagnosis not present

## 2015-04-21 ENCOUNTER — Ambulatory Visit (INDEPENDENT_AMBULATORY_CARE_PROVIDER_SITE_OTHER): Payer: Medicare Other | Admitting: Internal Medicine

## 2015-04-21 ENCOUNTER — Encounter: Payer: Self-pay | Admitting: Internal Medicine

## 2015-04-21 VITALS — BP 136/80 | HR 73 | Temp 98.5°F | Wt 235.0 lb

## 2015-04-21 DIAGNOSIS — I10 Essential (primary) hypertension: Secondary | ICD-10-CM | POA: Diagnosis not present

## 2015-04-21 DIAGNOSIS — R0602 Shortness of breath: Secondary | ICD-10-CM | POA: Diagnosis not present

## 2015-04-21 DIAGNOSIS — R7989 Other specified abnormal findings of blood chemistry: Secondary | ICD-10-CM | POA: Diagnosis not present

## 2015-04-21 LAB — COMPREHENSIVE METABOLIC PANEL
ALT: 22 U/L (ref 0–53)
AST: 27 U/L (ref 0–37)
Albumin: 3.8 g/dL (ref 3.5–5.2)
Alkaline Phosphatase: 75 U/L (ref 39–117)
BUN: 11 mg/dL (ref 6–23)
CALCIUM: 9.5 mg/dL (ref 8.4–10.5)
CHLORIDE: 98 meq/L (ref 96–112)
CO2: 34 meq/L — AB (ref 19–32)
Creatinine, Ser: 0.91 mg/dL (ref 0.40–1.50)
GFR: 90.59 mL/min (ref >60.00)
Glucose, Bld: 120 mg/dL — ABNORMAL HIGH (ref 70–99)
POTASSIUM: 3.5 meq/L (ref 3.5–5.1)
Sodium: 138 mEq/L (ref 135–145)
Total Bilirubin: 0.3 mg/dL (ref 0.2–1.2)
Total Protein: 7.1 g/dL (ref 6.0–8.3)

## 2015-04-21 LAB — LIPID PANEL
CHOL/HDL RATIO: 6
CHOLESTEROL: 175 mg/dL (ref 0–200)
HDL: 31.3 mg/dL — ABNORMAL LOW (ref >39.00)
NonHDL: 143.21
Triglycerides: 328 mg/dL — ABNORMAL HIGH (ref 0.0–149.0)
VLDL: 65.6 mg/dL — ABNORMAL HIGH (ref 0.0–40.0)

## 2015-04-21 LAB — LDL CHOLESTEROL, DIRECT: LDL DIRECT: 107 mg/dL

## 2015-04-21 NOTE — Patient Instructions (Signed)

## 2015-04-21 NOTE — Progress Notes (Signed)
Pre visit review using our clinic review tool, if applicable. No additional management support is needed unless otherwise documented below in the visit note. 

## 2015-04-21 NOTE — Progress Notes (Signed)
Subjective:    Patient ID: Joshua [name], male    DOB: 03/03/1956, 60 y.o.   MRN: [id number]  HPI  Pt presents to the clinic today to discuss his pain management appt yesterday. He reports he had a diagnostic study done, called Autonomic Balance Analysis. 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 He was advised that he would no longer be able to prescribe pain medication for him until he was cleared from a cardiac standpoint by his PCP. He does have a history of HTN, managed on HCTZ and Clonidine. There is no lipid profile in the system for review. He had a ECG 08/2012 which showed sinus rhythm. He does have COPD and is mildly short of breath at times. This is intermittent and seems to occur mainly when he is taking a hot shower. He denies DOE or shortness of breath at rest. He was prescribed Fluticasone but reports it makes him feel "funny" He does use the Albuterol every morning and every night. He is not following with a pulmonologist. He does continue to smoke.    Review of Systems      Past Medical History  Diagnosis Date  . Hypertension   . COPD (chronic obstructive pulmonary disease) ([place])   . Spinal disease   . Allergy   . Positive TB test   . Phlebitis   . Shortness of breath   . GERD (gastroesophageal reflux disease)   . Arthritis     Current Outpatient Prescriptions  Medication Sig Dispense Refill  . albuterol (PROAIR HFA) 108 (90 Base) MCG/ACT inhaler 1-2 puffs into lungs every 4-6 hours as needed 18 g 5  . cetirizine (ZYRTEC ALLERGY) 10 MG tablet Take 1 tablet (10 mg total) by mouth daily. 90 tablet 1  . cloNIDine (CATAPRES) 0.3 MG tablet Take 1 tablet (0.3 mg total) by mouth 2 (two) times daily. 180 tablet 1  . fluticasone (FLOVENT HFA) 44 MCG/ACT inhaler Inhale 2 puffs into the lungs 2 (two) times daily. 1 Inhaler 5  . gabapentin (NEURONTIN) 800 MG tablet Take 1  tablet (800 mg total) by mouth 3 (three) times daily. 270 tablet 1  . hydrochlorothiazide (HYDRODIURIL) 25 MG tablet Take 1 tablet (25 mg total) by mouth daily. 90 tablet 1  . omeprazole (PRILOSEC) 20 MG capsule Take 1 capsule (20 mg total) by mouth daily. 90 capsule 1  . oxyCODONE-acetaminophen (PERCOCET) 10-325 MG tablet Take 1 tablet by mouth every 8 (eight) hours.  0  . traZODone (DESYREL) 50 MG tablet Take 1 tablet (50 mg total) by mouth at bedtime as needed for sleep. 90 tablet 1  . [DISCONTINUED] lisinopril (PRINIVIL,ZESTRIL) 40 MG tablet TAKE 1 TABLET (40 MG TOTAL) BY MOUTH DAILY. 90 tablet 1   No current facility-administered medications for this visit.    Allergies  Allergen Reactions  . Lisinopril Anaphylaxis  . Contrast Media [Iodinated Diagnostic Agents] Hives and Itching  . Ibuprofen Other (See Comments)    Internal bleeding    Family History  Problem Relation Age of Onset  . Stomach cancer Mother     Deceased, 90s  . Hypertension Mother   . Hypertension Father   . Stroke Father   . Diabetes Neg Hx   . Early death Neg Hx   . Heart attack Father     Deceased, 44  . Healthy Daughter     Social History   Social  History  . Marital Status: Widowed    Spouse Name: N/A  . Number of Children: N/A  . Years of Education: 7   Occupational History  . Disabled    Social History Main Topics  . Smoking status: Current Every Day Smoker -- 1.00 packs/day for 44 years    Types: Cigarettes  . Smokeless tobacco: Never Used  . 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 Drug Use: No  . Sexual Activity: Yes   Other Topics Concern  . Not on file   Social History Narrative   Regular exercise-no   Caffeine Use-yes   Lives with wife in a one story home. Has 2 children.  On disability for low back pain.  Used to work as a Games developer, lasted worked in 2007.     Education: 7th grade.     Constitutional: Denies fever,  malaise, fatigue, headache or abrupt weight changes.  Respiratory: Pt reports shortness of breath. Denies difficulty breathing, cough or sputum production.   Cardiovascular: Denies chest pain, chest tightness, palpitations or swelling in the hands or feet.  Neurological: Denies dizziness, difficulty with memory, difficulty with speech or problems with balance and coordination.    No other specific complaints in a complete review of systems (except as listed in HPI above).  Objective:   Physical Exam   BP 136/80 mmHg  Pulse 73  Temp(Src) 98.5 F (36.9 C) (Oral)  Wt 235 lb (106.595 kg)  SpO2 97% Wt Readings from Last 3 Encounters:  04/21/15 235 lb (106.595 kg)  03/21/15 233 lb (105.688 kg)  12/26/14 232 lb 5 oz (105.376 kg)    General: Appears his stated age, obese, in NAD. Cardiovascular: Normal rate and rhythm. S1,S2 noted.  No murmur, rubs or gallops noted. No JVD or BLE edema. No carotid bruits noted. Pulmonary/Chest: Normal effort with intermittent expiratory wheeze. No respiratory distress. No rales or ronchi noted.  Musculoskeletal: Normal range of motion. No signs of joint swelling. No difficulty with gait.  Neurological: Alert and oriented.   BMET    Component Value Date/Time   NA 136 07/27/2014 1410   K 3.6 07/27/2014 1410   CL 98 07/27/2014 1410   CO2 30 07/27/2014 1410   GLUCOSE 102* 07/27/2014 1410   BUN 13 07/27/2014 1410   CREATININE 0.98 07/27/2014 1410   CREATININE 1.08 11/20/2011 1514   CALCIUM 9.3 07/27/2014 1410   GFRNONAA >90 10/01/2012 1030   GFRAA >90 10/01/2012 1030    Lipid Panel  No results found for: CHOL, TRIG, HDL, CHOLHDL, VLDL, LDLCALC  CBC    Component Value Date/Time   WBC 13.5* 07/27/2014 1410   RBC 5.69 07/27/2014 1410   HGB 14.9 07/27/2014 1410   HCT 44.5 07/27/2014 1410   PLT 370.0 07/27/2014 1410   MCV 78.3 07/27/2014 1410   MCH 31.0 10/01/2012 1525   MCHC 33.6 07/27/2014 1410   RDW 15.9* 07/27/2014 1410   LYMPHSABS 3.7  08/26/2012 0855   MONOABS 1.3* 08/26/2012 0855   EOSABS 0.3 08/26/2012 0855   BASOSABS 0.1 08/26/2012 0855    Hgb A1C Lab Results  Component Value Date   HGBA1C 6.2 01/31/2015        Assessment & Plan:   Shortness of breath:  Will refer to cardiology for further evaluation of this possible cardiac autonomic neuropathy Will get CMET and Lipid profile today I have no major concerns about his heart today  but I would prefer specialty evaluation Autonomic Balance Analysis reviewed  RTC as needed or if symptoms persist or worsen

## 2015-04-25 ENCOUNTER — Telehealth: Payer: Self-pay | Admitting: Internal Medicine

## 2015-04-25 NOTE — Telephone Encounter (Signed)
Patient brought a form for a back brace when he came in for his appointment last week.  Patient wants to know if the form is ready to be picked up.  Please call patient.

## 2015-05-03 ENCOUNTER — Telehealth: Payer: Self-pay | Admitting: Internal Medicine

## 2015-05-03 NOTE — Telephone Encounter (Signed)
Pt returned call that was called to his wife;s phone  Please call [phone] Thanks

## 2015-05-04 NOTE — Telephone Encounter (Signed)
Left message on voicemail.

## 2015-05-08 ENCOUNTER — Ambulatory Visit (INDEPENDENT_AMBULATORY_CARE_PROVIDER_SITE_OTHER): Payer: Medicare Other | Admitting: Cardiology

## 2015-05-08 ENCOUNTER — Encounter (INDEPENDENT_AMBULATORY_CARE_PROVIDER_SITE_OTHER): Payer: Self-pay

## 2015-05-08 ENCOUNTER — Encounter: Payer: Self-pay | Admitting: Cardiology

## 2015-05-08 VITALS — BP 110/82 | HR 68 | Ht 69.0 in | Wt 235.0 lb

## 2015-05-08 DIAGNOSIS — R0789 Other chest pain: Secondary | ICD-10-CM | POA: Diagnosis not present

## 2015-05-08 DIAGNOSIS — Z720 Tobacco use: Secondary | ICD-10-CM | POA: Insufficient documentation

## 2015-05-08 DIAGNOSIS — G8929 Other chronic pain: Secondary | ICD-10-CM

## 2015-05-08 DIAGNOSIS — E1142 Type 2 diabetes mellitus with diabetic polyneuropathy: Secondary | ICD-10-CM

## 2015-05-08 DIAGNOSIS — I739 Peripheral vascular disease, unspecified: Secondary | ICD-10-CM | POA: Diagnosis not present

## 2015-05-08 NOTE — Progress Notes (Signed)
Cardiology Office Note    Date:  05/08/2015   ID:  Joshua [name], DOB Nov 30, 1955, MRN [id number]  PCP:  Webb Silversmith, NP  Cardiologist:   Candee Furbish, MD     History of Present Illness:  Joshua [name] is a 60 y.o. male here for evaluation of his heart prior to long-term pain management at the request of Dr. Garnette Gunner. In review of office notes, he went to a pain management clinic on 04/20/15 and had diagnostic studies done there and he was told that "heart will explode "and they did not prescribe any pain medication unless he had a letter from his primary care physician saying it is okay to proceed with pain medication. He underwent autonomic balance analysis. 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 Clinical protection of the heart is recommended for example beta blockers, ace inhibitors, ARB or calcium channel blockers and additional cardiovascular workouts. Symptoms such as on the static hypotension, heat intolerance, vagal dystonia. This may be a sign of mental fatigue, chronic stress and possibly presence of Chronic health condition affecting. Subhepatic nervous system. Deep breathing showed normal parasympathetic response to stimulation. Valsalva showed hyperactive sympathetic response to stimulation and could be a cause for potential hypertension. Standing indicated parasympathetic surge. Heart rate upon standing is considered low.  [place], PVD, Fem Pop?. Dr. Florene Glen. Has abdominal scar.  He notes that at times during exercise he'll feel some mild chest pressure. He certainly does not overexaggerate this. No significant shortness of breath. Does have wheeze occasionally from smoking.   Past Medical History  Diagnosis Date  . Hypertension   . COPD (chronic obstructive pulmonary disease) ([place])   . Spinal disease   . Allergy   . Positive TB test   . Phlebitis   . Shortness of breath   . GERD  (gastroesophageal reflux disease)   . Arthritis     Past Surgical History  Procedure Laterality Date  . Arterial bypass surgry    . Cervical spine surgery    . Back surgery    . Anterior cervical decomp/discectomy fusion N/A 10/06/2012    Procedure: ANTERIOR CERVICAL DECOMPRESSION/DISCECTOMY FUSION 1 LEVEL;  Surgeon: [name], MD;  Location: [facility];  Service: Neurosurgery;  Laterality: N/A;  C7T1 anterior cervical decompression with fusion plating and bonegraft with Stalif-C (Zimmer)    Outpatient Prescriptions Prior to Visit  Medication Sig Dispense Refill  . albuterol (PROAIR HFA) 108 (90 Base) MCG/ACT inhaler 1-2 puffs into lungs every 4-6 hours as needed 18 g 5  . cetirizine (ZYRTEC ALLERGY) 10 MG tablet Take 1 tablet (10 mg total) by mouth daily. 90 tablet 1  . cloNIDine (CATAPRES) 0.3 MG tablet Take 1 tablet (0.3 mg total) by mouth 2 (two) times daily. 180 tablet 1  . fluticasone (FLOVENT HFA) 44 MCG/ACT inhaler Inhale 2 puffs into the lungs 2 (two) times daily. 1 Inhaler 5  . gabapentin (NEURONTIN) 800 MG tablet Take 1 tablet (800 mg total) by mouth 3 (three) times daily. 270 tablet 1  . hydrochlorothiazide (HYDRODIURIL) 25 MG tablet Take 1 tablet (25 mg total) by mouth daily. 90 tablet 1  . omeprazole (PRILOSEC) 20 MG capsule Take 1 capsule (20 mg total) by mouth daily. 90 capsule 1  . oxyCODONE-acetaminophen (PERCOCET) 10-325 MG tablet Take 1 tablet by mouth every 8 (eight) hours.  0  . traZODone (DESYREL) 50 MG tablet Take 1 tablet (50 mg total) by mouth  at bedtime as needed for sleep. 90 tablet 1   No facility-administered medications prior to visit.     Allergies:   Lisinopril; Contrast media; and Ibuprofen   Social History   Social History  . Marital Status: Widowed    Spouse Name: N/A  . Number of Children: N/A  . Years of Education: 7   Occupational History  . Disabled    Social History Main Topics  . Smoking status: Current Every Day Smoker --  1.00 packs/day for 44 years    Types: Cigarettes  . Smokeless tobacco: Never Used  . 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 Drug Use: No  . Sexual Activity: Yes   Other Topics Concern  . None   Social History Narrative   Regular exercise-no   Caffeine Use-yes   Lives with wife in a one story home. Has 2 children.  On disability for low back pain.  Used to work as a Games developer, lasted worked in 2007.     Education: 7th grade.     Family History:  The patient's family history includes Healthy in his daughter; Heart attack in his father; Hypertension in his father and mother; Stomach cancer in his mother; Stroke in his father. There is no history of Diabetes or Early death.   ROS:   Please see the history of present illness.    ROS back pain, muscle pain, leg swelling, shortness of breath with activity, wheezing, snoring, leg pain All other systems reviewed and are negative.   PHYSICAL EXAM:   VS:  BP 110/82 mmHg  Pulse 68  Ht '5\' 9"'$  (1.753 m)  Wt 235 lb (106.595 kg)  BMI 34.69 kg/m2   GEN: Well nourished, well developed, in no acute distress HEENT: normal Neck: no JVD, carotid bruits, or masses Cardiac: RRR; no murmurs, rubs, or gallops,no edema  Respiratory:  Mild wheezes heard bilateral lower lobes, normal respiratory effort.  GI: soft, nontender, nondistended, + BS, abdominal scar noted MS: no deformity or atrophy Skin: warm and dry, no rash, diminished distal pulses Neuro:  Alert and Oriented x 3, Strength and sensation are intact Psych: euthymic mood, full affect  Wt Readings from Last 3 Encounters:  05/08/15 235 lb (106.595 kg)  04/21/15 235 lb (106.595 kg)  03/21/15 233 lb (105.688 kg)      Studies/Labs Reviewed:   EKG:  EKG is ordered today.  The ekg ordered today demonstrates 05/08/15-sinus rhythm, 68, nonspecific ST-T wave changes  Recent Labs: 07/27/2014: Hemoglobin 14.9; Platelets 370.0 04/21/2015:  ALT 22; BUN 11; Creatinine, Ser 0.91; Potassium 3.5; Sodium 138   Lipid Panel    Component Value Date/Time   CHOL 175 04/21/2015 1352   TRIG 328.0* 04/21/2015 1352   HDL 31.30* 04/21/2015 1352   CHOLHDL 6 04/21/2015 1352   VLDL 65.6* 04/21/2015 1352   LDLDIRECT 107.0 04/21/2015 1352    Additional studies/ records that were reviewed today include:  Prior office records reviewed, lab work reviewed, EKGs reviewed, autonomic study reviewed    ASSESSMENT:    No diagnosis found.   PLAN:  In order of problems listed above:  1. Chest pain-mild chest pressure noted when exerting himself. With his extensive peripheral vascular disease history, smoking history we will go ahead and proceed with nuclear stress test, pharmacologic. A request was made to me to make sure that his cardiac status was sound prior to pain management.  2.  Peripheral vascular disease-suffers with neuropathy as well as. Gabapentin. Smoking cessation. Exercise. He and his wife are going to the gym. 3. Tobacco use-encouraged cessation 4. Diabetic neuropathy as above. Controlled diabetes. Webb Silversmith, NP is his primary, working hard with him. 5. Chronic pain control-has had cervical spine surgery, back pain, leg pain, foot pain. He has been without his pain medication for 2 weeks. If nuclear stress test is okay, he needs a letter stating that it is fine for him to proceed with pain management.    Medication Adjustments/Labs and Tests Ordered: Current medicines are reviewed at length with the patient today.  Concerns regarding medicines are outlined above.  Medication changes, Labs and Tests ordered today are listed in the Patient Instructions below. There are no Patient Instructions on file for this visit.     Bobby Rumpf, MD  05/08/2015 1:31 PM    [facility] [place], [place], [place]  [address] Phone: [phone]; Fax: [phone]

## 2015-05-08 NOTE — Patient Instructions (Signed)
Medication Instructions:  The current medical regimen is effective;  continue present plan and medications.  Testing/Procedures: Your physician has requested that you have a lexiscan myoview. For further information please visit [email or web address]. Please follow instruction sheet, as given.  Follow-Up: Follow up as needed after testing.  If you need a refill on your cardiac medications before your next appointment, please call your pharmacy.  Thank you for choosing [facility]!!

## 2015-05-09 ENCOUNTER — Ambulatory Visit (HOSPITAL_BASED_OUTPATIENT_CLINIC_OR_DEPARTMENT_OTHER): Payer: Medicare Other | Attending: Nurse Practitioner | Admitting: Radiology

## 2015-05-09 VITALS — Ht 69.0 in | Wt 233.0 lb

## 2015-05-09 DIAGNOSIS — R5383 Other fatigue: Secondary | ICD-10-CM | POA: Insufficient documentation

## 2015-05-09 DIAGNOSIS — G4761 Periodic limb movement disorder: Secondary | ICD-10-CM | POA: Diagnosis not present

## 2015-05-09 DIAGNOSIS — Z79899 Other long term (current) drug therapy: Secondary | ICD-10-CM | POA: Insufficient documentation

## 2015-05-09 DIAGNOSIS — G4733 Obstructive sleep apnea (adult) (pediatric): Secondary | ICD-10-CM | POA: Diagnosis not present

## 2015-05-09 DIAGNOSIS — Z6834 Body mass index (BMI) 34.0-34.9, adult: Secondary | ICD-10-CM | POA: Insufficient documentation

## 2015-05-09 DIAGNOSIS — I10 Essential (primary) hypertension: Secondary | ICD-10-CM | POA: Insufficient documentation

## 2015-05-09 DIAGNOSIS — G4719 Other hypersomnia: Secondary | ICD-10-CM | POA: Insufficient documentation

## 2015-05-09 DIAGNOSIS — R0683 Snoring: Secondary | ICD-10-CM | POA: Insufficient documentation

## 2015-05-09 DIAGNOSIS — E119 Type 2 diabetes mellitus without complications: Secondary | ICD-10-CM | POA: Diagnosis not present

## 2015-05-09 DIAGNOSIS — J449 Chronic obstructive pulmonary disease, unspecified: Secondary | ICD-10-CM | POA: Insufficient documentation

## 2015-05-09 DIAGNOSIS — E669 Obesity, unspecified: Secondary | ICD-10-CM | POA: Diagnosis not present

## 2015-05-10 ENCOUNTER — Ambulatory Visit (HOSPITAL_COMMUNITY): Payer: Medicare Other | Attending: Cardiology

## 2015-05-10 ENCOUNTER — Telehealth: Payer: Self-pay | Admitting: *Deleted

## 2015-05-10 DIAGNOSIS — R0789 Other chest pain: Secondary | ICD-10-CM | POA: Diagnosis not present

## 2015-05-10 DIAGNOSIS — I10 Essential (primary) hypertension: Secondary | ICD-10-CM | POA: Insufficient documentation

## 2015-05-10 DIAGNOSIS — R9439 Abnormal result of other cardiovascular function study: Secondary | ICD-10-CM | POA: Diagnosis not present

## 2015-05-10 LAB — MYOCARDIAL PERFUSION IMAGING
CHL CUP NUCLEAR SRS: 5
CHL CUP NUCLEAR SSS: 8
LHR: 0.33
LV dias vol: 90 mL
LV sys vol: 33 mL
NUC STRESS TID: 0.97
Peak HR: 85 {beats}/min
Rest HR: 63 {beats}/min
SDS: 4

## 2015-05-10 MED ORDER — TECHNETIUM TC 99M SESTAMIBI GENERIC - CARDIOLITE
10.8000 | Freq: Once | INTRAVENOUS | Status: AC | PRN
  Administered 2015-05-10: 11 via INTRAVENOUS

## 2015-05-10 MED ORDER — TECHNETIUM TC 99M SESTAMIBI GENERIC - CARDIOLITE
33.0000 | Freq: Once | INTRAVENOUS | Status: AC | PRN
  Administered 2015-05-10: 33 via INTRAVENOUS

## 2015-05-10 MED ORDER — REGADENOSON 0.4 MG/5ML IV SOLN
0.4000 mg | Freq: Once | INTRAVENOUS | Status: AC
  Administered 2015-05-10: 0.4 mg via INTRAVENOUS

## 2015-05-10 NOTE — Telephone Encounter (Signed)
Left message for pt to call the office for results of testing and to schedule f/u to discuss possible further testing (cardiac cath).  Please continue to attempt to contact him to make sure he is aware of the results and to schedule.

## 2015-05-10 NOTE — Telephone Encounter (Signed)
-----   Message from Jerline Pain, MD sent at 05/10/2015  4:24 PM EST ----- Abnormal stress test. Inferolateral ischemia. Normal EF. Old infarct pattern possible as well inferiorly. Please have him come in to see Richardson Dopp, PA for discussion of cardiac catheterization.  Thanks.  Candee Furbish, MD

## 2015-05-11 NOTE — Telephone Encounter (Signed)
Pt called back.  Appointment w/Scott Kathlen Mody was end of March.  He told the schedulers that he couldn't wait that long so they scheduled him to see Audry Riles for [place] 3/27.

## 2015-05-14 DIAGNOSIS — G4733 Obstructive sleep apnea (adult) (pediatric): Secondary | ICD-10-CM | POA: Diagnosis not present

## 2015-05-14 NOTE — Progress Notes (Signed)
  Patient Name: Joshua [name], Schiele Date: 05/09/2015 Gender: Male D.O.B: 1955/08/25 Age (years): 59 Referring Provider: Joyce Copa Height (inches): 34 Interpreting Physician: Baird Lyons MD, ABSM Weight (lbs): 233 RPSGT: Carolin Coy BMI: 34 MRN: [id number] Neck Size: 16.50 CLINICAL INFORMATION Sleep Study Type: NPSG Indication for sleep study: COPD, Diabetes, Excessive Daytime Sleepiness, Fatigue, Hypertension, Obesity, Snoring Epworth Sleepiness Score: 4  SLEEP STUDY TECHNIQUE As per the AASM Manual for the Scoring of Sleep and Associated Events v2.3 (April 2016) with a hypopnea requiring 4% desaturations. The channels recorded and monitored were frontal, central and occipital EEG, electrooculogram (EOG), submentalis EMG (chin), nasal and oral airflow, thoracic and abdominal wall motion, anterior tibialis EMG, snore microphone, electrocardiogram, and pulse oximetry.  MEDICATIONS Patient's medications include: charted for review Medications self-administered by patient during sleep study : TRAZODONE, GABAPENTIN, MEGA OMEGA FISH OIL SOFTGELS.  SLEEP ARCHITECTURE The study was initiated at 9:51:21 PM and ended at 4:55:10 AM. Sleep onset time was 8.4 minutes and the sleep efficiency was 83.7%. The total sleep time was 354.9 minutes. Stage REM latency was 171.5 minutes. The patient spent 10.14% of the night in stage N1 sleep, 71.40% in stage N2 sleep, 0.00% in stage N3 and 18.46% in REM. Alpha intrusion was absent. Supine sleep was 7.49%. Wake after sleep onset 60 minutes  RESPIRATORY PARAMETERS The overall apnea/hypopnea index (AHI) was 4.2 per hour. There were 2 total apneas, including 0 obstructive, 2 central and 0 mixed apneas. There were 23 hypopneas and 16 RERAs. The AHI during Stage REM sleep was 12.8 per hour. AHI while supine was 20.3 per hour. The mean oxygen saturation was 92.78%. The minimum SpO2 during sleep was 89.00%. Moderate snoring was noted  during this study.  CARDIAC DATA The 2 lead EKG demonstrated sinus rhythm. The mean heart rate was 58.23 beats per minute. Other EKG findings include: None.  LEG MOVEMENT DATA The total PLMS were 1242 with a resulting PLMS index of 209.99. Associated arousal with leg movement index was 4.4 .  IMPRESSIONS - No significant obstructive sleep apnea occurred during this study (AHI = 4.2/h). - No significant central sleep apnea occurred during this study (CAI = 0.3/h). - The patient had minimal or no oxygen desaturation during the study (Min O2 = 89.00%) - The patient snored with Moderate snoring volume. - No cardiac abnormalities were noted during this study. - Severe periodic limb movements of sleep occurred during the study, with some associated arousals.  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 Specific therapy might include Requip or Mirapex if appropriate - Positional therapy avoiding supine position during sleep. - Avoid alcohol, sedatives and other CNS depressants that may worsen sleep apnea and disrupt normal sleep architecture. - Sleep hygiene should be reviewed to assess factors that may improve sleep quality. - Weight management and regular exercise should be initiated or continued if appropriate.  Deneise Lever Diplomate, American Board of Sleep Medicine  ELECTRONICALLY SIGNED ON:  05/14/2015, 9:08 AM [place] PH: [phone]   FX: [phone] [place]

## 2015-05-15 ENCOUNTER — Other Ambulatory Visit: Payer: Self-pay | Admitting: Physician Assistant

## 2015-05-15 ENCOUNTER — Ambulatory Visit (INDEPENDENT_AMBULATORY_CARE_PROVIDER_SITE_OTHER): Payer: Medicare Other | Admitting: Physician Assistant

## 2015-05-15 ENCOUNTER — Encounter: Payer: Self-pay | Admitting: Physician Assistant

## 2015-05-15 VITALS — BP 130/80 | HR 88 | Ht 69.0 in | Wt 238.4 lb

## 2015-05-15 DIAGNOSIS — R9439 Abnormal result of other cardiovascular function study: Secondary | ICD-10-CM

## 2015-05-15 DIAGNOSIS — Z01812 Encounter for preprocedural laboratory examination: Secondary | ICD-10-CM

## 2015-05-15 DIAGNOSIS — E1142 Type 2 diabetes mellitus with diabetic polyneuropathy: Secondary | ICD-10-CM

## 2015-05-15 DIAGNOSIS — I739 Peripheral vascular disease, unspecified: Secondary | ICD-10-CM

## 2015-05-15 DIAGNOSIS — J449 Chronic obstructive pulmonary disease, unspecified: Secondary | ICD-10-CM

## 2015-05-15 DIAGNOSIS — I10 Essential (primary) hypertension: Secondary | ICD-10-CM

## 2015-05-15 DIAGNOSIS — R931 Abnormal findings on diagnostic imaging of heart and coronary circulation: Secondary | ICD-10-CM

## 2015-05-15 DIAGNOSIS — E119 Type 2 diabetes mellitus without complications: Secondary | ICD-10-CM | POA: Diagnosis not present

## 2015-05-15 DIAGNOSIS — Z720 Tobacco use: Secondary | ICD-10-CM

## 2015-05-15 DIAGNOSIS — R079 Chest pain, unspecified: Secondary | ICD-10-CM | POA: Diagnosis present

## 2015-05-15 LAB — BASIC METABOLIC PANEL
BUN: 13 mg/dL (ref 7–25)
CALCIUM: 9.7 mg/dL (ref 8.6–10.3)
CO2: 28 mmol/L (ref 20–31)
Chloride: 98 mmol/L (ref 98–110)
Creat: 0.96 mg/dL (ref 0.70–1.33)
GLUCOSE: 84 mg/dL (ref 65–99)
Potassium: 4.2 mmol/L (ref 3.5–5.3)
SODIUM: 138 mmol/L (ref 135–146)

## 2015-05-15 LAB — CBC WITH DIFFERENTIAL/PLATELET
BASOS ABS: 0.1 10*3/uL (ref 0.0–0.1)
Basophils Relative: 1 % (ref 0–1)
EOS ABS: 0.3 10*3/uL (ref 0.0–0.7)
EOS PCT: 2 % (ref 0–5)
HEMATOCRIT: 49.5 % (ref 39.0–52.0)
Hemoglobin: 16.9 g/dL (ref 13.0–17.0)
LYMPHS ABS: 3.1 10*3/uL (ref 0.7–4.0)
LYMPHS PCT: 24 % (ref 12–46)
MCH: 28.6 pg (ref 26.0–34.0)
MCHC: 34.1 g/dL (ref 30.0–36.0)
MCV: 83.9 fL (ref 78.0–100.0)
MONOS PCT: 9 % (ref 3–12)
MPV: 10.6 fL (ref 8.6–12.4)
Monocytes Absolute: 1.2 10*3/uL — ABNORMAL HIGH (ref 0.1–1.0)
Neutro Abs: 8.2 10*3/uL — ABNORMAL HIGH (ref 1.7–7.7)
Neutrophils Relative %: 64 % (ref 43–77)
Platelets: 281 10*3/uL (ref 150–400)
RBC: 5.9 MIL/uL — ABNORMAL HIGH (ref 4.22–5.81)
RDW: 15.4 % (ref 11.5–15.5)
WBC: 12.8 10*3/uL — ABNORMAL HIGH (ref 4.0–10.5)

## 2015-05-15 LAB — PROTIME-INR
INR: 0.89 (ref <1.50)
PROTHROMBIN TIME: 12.1 s (ref 11.6–15.2)

## 2015-05-15 MED ORDER — LEVALBUTEROL HCL 0.63 MG/3ML IN NEBU: 0.6300 mg | INHALATION_SOLUTION | Freq: Once | RESPIRATORY_TRACT | Status: DC

## 2015-05-15 NOTE — Patient Instructions (Signed)
Medication Instructions:  Your physician recommends that you continue on your current medications as directed. Please refer to the Current Medication list given to you today.   Labwork: Bmet, Cbc, Pt/Inr today  Testing/Procedures: Your physician has requested that you have a lower extremity arterial exercise duplex. During this test, exercise and ultrasound are used to evaluate arterial blood flow in the legs. Allow one hour for this exam. There are no restrictions or special instructions.  Your physician has recommended that you have a pulmonary function test. Pulmonary Function Tests are a group of tests that measure how well air moves in and out of your lungs.  Your physician has requested that you have a cardiac catheterization. Cardiac catheterization is used to diagnose and/or treat various heart conditions. Doctors may recommend this procedure for a number of different reasons. The most common reason is to evaluate chest pain. Chest pain can be a symptom of coronary artery disease (CAD), and cardiac catheterization can show whether plaque is narrowing or blocking your heart's arteries. This procedure is also used to evaluate the valves, as well as measure the blood flow and oxygen levels in different parts of your heart. For further information please visit HugeFiesta.[email or web address]. Please follow instruction sheet, as given.    Follow-Up: Your physician recommends that you schedule a follow-up appointment in: 2-3 weeks with Dr.Skains   Any Other Special Instructions Will Be Listed Below (If Applicable).     If you need a refill on your cardiac medications before your next appointment, please call your pharmacy.

## 2015-05-15 NOTE — Progress Notes (Signed)
Patient ID: Joshua [name], male   DOB: 11-25-1955, 60 y.o.   MRN: [id number]    Date:  05/15/2015   ID:  Joshua [name], DOB 02-Jul-1955, MRN [id number]  PCP:  Webb Silversmith, NP  Primary Cardiologist:  Marlou Porch  Chief complaint: Follow-up abnormal stress test   History of Present Illness: Joshua [name] is a 60 y.o. male history of continued tobacco abuse hypertension, COPD, peripheral vascular disease, [place], GERD and arthritis.  He was seen February 20th for evaluation of his heart prior to long-term pain management at the request of Dr. Garnette Gunner. In review of office notes, he went to a pain management clinic on 04/20/15 and had diagnostic studies done there and he was told that "heart will explode "and they did not prescribe any pain medication unless he had a letter from his primary care physician saying it is okay to proceed with pain medication. He underwent autonomic balance analysis. 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 Clinical protection of the heart is recommended for example beta blockers, ace inhibitors, ARB or calcium channel blockers and additional cardiovascular workouts. Symptoms such as on the static hypotension, heat intolerance, vagal dystonia. This may be a sign of mental fatigue, chronic stress and possibly presence of Chronic health condition affecting. Subhepatic nervous system. Deep breathing showed normal parasympathetic response to stimulation. Valsalva showed hyperactive sympathetic response to stimulation and could be a cause for potential hypertension. Standing indicated parasympathetic surge. Heart rate upon standing is considered low.  He underwent nuclear stress testing on February 22 which was abnormal. He presents today for discussion of heart catheterization. He's not having any chest pain today however, he is very  wheezy with some shortness of breath. He has not taken his albuterol.  He referred port severe burning in his feet like they are on fire and perhaps claudication  The patient currently denies nausea, vomiting, fever, orthopnea, dizziness, PND, cough, congestion, abdominal pain, hematochezia, melena, lower extremity edema.  Wt Readings from Last 3 Encounters:  05/15/15 238 lb 6.4 oz (108.138 kg)  05/10/15 235 lb (106.595 kg)  05/09/15 233 lb (105.688 kg)     Past Medical History  Diagnosis Date  . Hypertension   . COPD (chronic obstructive pulmonary disease) ([place])   . Spinal disease   . Allergy   . Positive TB test   . Phlebitis   . Shortness of breath   . GERD (gastroesophageal reflux disease)   . Arthritis    Past Surgical History  Procedure Laterality Date  . Arterial bypass surgry    . Cervical spine surgery    . Back surgery    . Anterior cervical decomp/discectomy fusion N/A 10/06/2012    Procedure: ANTERIOR CERVICAL DECOMPRESSION/DISCECTOMY FUSION 1 LEVEL;  Surgeon: [name], MD;  Location: [facility];  Service: Neurosurgery;  Laterality: N/A;  C7T1 anterior cervical decompression with fusion plating and bonegraft with Stalif-C ([facility])     Current Outpatient Prescriptions  Medication Sig Dispense Refill  . albuterol (PROVENTIL HFA;VENTOLIN HFA) 108 (90 Base) MCG/ACT inhaler Inhale 2 puffs into the lungs every 6 (six) hours as needed for wheezing or shortness of breath.    . cetirizine (ZYRTEC ALLERGY) 10 MG tablet Take 1 tablet (10 mg total) by mouth daily. (Patient taking differently: Take 10 mg by mouth daily as needed for allergies. ) 90 tablet 1  . cloNIDine (CATAPRES) 0.3 MG  tablet Take 1 tablet (0.3 mg total) by mouth 2 (two) times daily. 180 tablet 1  . gabapentin (NEURONTIN) 800 MG tablet Take 1 tablet (800 mg total) by mouth 3 (three) times daily. 270 tablet 1  . hydrochlorothiazide (HYDRODIURIL) 25 MG tablet Take 1 tablet (25 mg total) by mouth daily.  90 tablet 1  . omeprazole (PRILOSEC) 20 MG capsule Take 1 capsule (20 mg total) by mouth daily. 90 capsule 1  . traZODone (DESYREL) 50 MG tablet Take 1 tablet (50 mg total) by mouth at bedtime as needed for sleep. 90 tablet 1  . beclomethasone (QVAR) 80 MCG/ACT inhaler Inhale 2 puffs into the lungs 2 (two) times daily.    . Omega-3 Fatty Acids (FISH OIL) 1200 MG CAPS Take 1 capsule by mouth 3 (three) times daily.    [name] oxyCODONE-acetaminophen (PERCOCET) 10-325 MG tablet Take 1 tablet by mouth every 8 (eight) hours. Reported on 05/15/2015  0  . [DISCONTINUED] lisinopril (PRINIVIL,ZESTRIL) 40 MG tablet TAKE 1 TABLET (40 MG TOTAL) BY MOUTH DAILY. 90 tablet 1   Current Facility-Administered Medications  Medication Dose Route Frequency Provider Last Rate Last Dose  . levalbuterol (XOPENEX) nebulizer solution 0.63 mg  0.63 mg Nebulization Once Brett Canales, PA-C        Allergies:    Allergies  Allergen Reactions  . Lisinopril Anaphylaxis  . Contrast Media [Iodinated Diagnostic Agents] Hives and Itching    Pt can use if taking benadryl   . Ibuprofen Other (See Comments)    Internal bleeding    Social History:  The patient  reports that he has been smoking Cigarettes.  He has a 44 pack-year smoking history. He has never used smokeless tobacco. He reports that he does not drink alcohol or use illicit drugs.   Family history:   Family History  Problem Relation Age of Onset  . Stomach cancer Mother     Deceased, 3s  . Hypertension Mother   . Hypertension Father   . Stroke Father   . Diabetes Neg Hx   . Early death Neg Hx   . Heart attack Father     Deceased, 38  . Healthy Daughter     ROS:  Please see the history of present illness.  All other systems reviewed and negative.   PHYSICAL EXAM: VS:  BP 130/80 mmHg  Pulse 88  Ht '5\' 9"'$  (1.753 m)  Wt 238 lb 6.4 oz (108.138 kg)  BMI 35.19 kg/m2 Well nourished, well developed, in no acute distress HEENT: Pupils are equal round react to  light accommodation extraocular movements are intact.  Neck: no JVDNo cervical lymphadenopathy. Cardiac: Regular rate and rhythm without murmurs rubs or gallops. Lungs:  Very wheezy on exam Abd: soft, nontender, positive bowel sounds all quadrants, no hepatosplenomegaly Ext: no lower extremity edema.  2+ radial and 1+ left posterior tibialis otherwise very poor distal pulses. 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)   Tobacco use (Chronic)   Peripheral vascular disease (HCC) (Chronic)   Essential hypertension (Chronic)   COPD (chronic obstructive pulmonary disease) (HCC)   Relevant Medications   albuterol (PROVENTIL HFA;VENTOLIN HFA) 108 (90 Base) MCG/ACT inhaler   levalbuterol (XOPENEX) nebulizer solution 0.63 mg (Start on 05/15/2015  5:15 PM)   Other Relevant Orders   Pulmonary function test   Chest pain with high risk  for cardiac etiology (Chronic)   Abnormal nuclear stress test    Other Visit Diagnoses    Abnormal stress test    -  Primary    Relevant Medications    levalbuterol (XOPENEX) nebulizer solution 0.63 mg (Start on 05/15/2015  5:15 PM)    Pre-procedure lab exam        Relevant Orders    Basic metabolic panel (Completed)    INR/PT (Completed)    CBC w/Diff (Completed)    PVD (peripheral vascular disease) ([place])        Relevant Orders    LE ART SEG MULTI (Segm & LE [place])       Joshua [name] will be scheduled for left heart catheterization this coming Wednesday.  The patient understands that risks include but are not limited to stroke (1 in 1000), death (1 in 60), kidney failure [usually temporary] (1 in 500), bleeding (1 in 200), allergic reaction [possibly serious] (1 in 200). The patient understands and is willing to proceed.  He reports getting hives and itching after getting contrast and that he'll be okay with  Benadryl prior to the cath.  We also obtained but discuss tobacco cessation again.  His been complaining of burning in his feet which is likely related to his peripheral nephropathy from diabetes however, he has not had any evaluation of his peripheral vascular disease in 10 years. He has very poor distal pulses. Will get lower extremity arterial Dopplers.  He is very wheezy on his exam recommended he go home and use his albuterol inhaler. I will order precath Xopenex nebulizers. His COPD does not seem to be very well controlled and is only on albuterol inhaler.  I ordered Pulmonary function tests.    Blood pressure controlled.

## 2015-05-16 ENCOUNTER — Encounter (HOSPITAL_COMMUNITY)
Admission: AD | Disposition: A | Discharge status: Home / Self Care | Payer: Self-pay | Source: Ambulatory Visit | Attending: Cardiology

## 2015-05-16 ENCOUNTER — Inpatient Hospital Stay (HOSPITAL_COMMUNITY)
Admission: AD | Admit: 2015-05-16 | Discharge: 2015-05-18 | DRG: 247 | Disposition: A | Discharge status: Home / Self Care | Payer: Medicare Other | Source: Ambulatory Visit | Attending: Cardiology | Admitting: Cardiology

## 2015-05-16 ENCOUNTER — Encounter (HOSPITAL_COMMUNITY): Payer: Self-pay | Admitting: General Practice

## 2015-05-16 DIAGNOSIS — E1142 Type 2 diabetes mellitus with diabetic polyneuropathy: Secondary | ICD-10-CM | POA: Diagnosis not present

## 2015-05-16 DIAGNOSIS — I25110 Atherosclerotic heart disease of native coronary artery with unstable angina pectoris: Secondary | ICD-10-CM | POA: Diagnosis not present

## 2015-05-16 DIAGNOSIS — Z823 Family history of stroke: Secondary | ICD-10-CM | POA: Diagnosis not present

## 2015-05-16 DIAGNOSIS — Z79899 Other long term (current) drug therapy: Secondary | ICD-10-CM

## 2015-05-16 DIAGNOSIS — Z888 Allergy status to other drugs, medicaments and biological substances status: Secondary | ICD-10-CM

## 2015-05-16 DIAGNOSIS — Z981 Arthrodesis status: Secondary | ICD-10-CM | POA: Diagnosis not present

## 2015-05-16 DIAGNOSIS — K219 Gastro-esophageal reflux disease without esophagitis: Secondary | ICD-10-CM | POA: Diagnosis not present

## 2015-05-16 DIAGNOSIS — F17210 Nicotine dependence, cigarettes, uncomplicated: Secondary | ICD-10-CM | POA: Diagnosis present

## 2015-05-16 DIAGNOSIS — Z720 Tobacco use: Secondary | ICD-10-CM | POA: Diagnosis not present

## 2015-05-16 DIAGNOSIS — I739 Peripheral vascular disease, unspecified: Secondary | ICD-10-CM | POA: Diagnosis present

## 2015-05-16 DIAGNOSIS — J449 Chronic obstructive pulmonary disease, unspecified: Secondary | ICD-10-CM | POA: Diagnosis not present

## 2015-05-16 DIAGNOSIS — E1121 Type 2 diabetes mellitus with diabetic nephropathy: Secondary | ICD-10-CM | POA: Diagnosis present

## 2015-05-16 DIAGNOSIS — R931 Abnormal findings on diagnostic imaging of heart and coronary circulation: Secondary | ICD-10-CM | POA: Diagnosis not present

## 2015-05-16 DIAGNOSIS — Z7951 Long term (current) use of inhaled steroids: Secondary | ICD-10-CM | POA: Diagnosis not present

## 2015-05-16 DIAGNOSIS — I10 Essential (primary) hypertension: Secondary | ICD-10-CM | POA: Diagnosis present

## 2015-05-16 DIAGNOSIS — I2510 Atherosclerotic heart disease of native coronary artery without angina pectoris: Secondary | ICD-10-CM

## 2015-05-16 DIAGNOSIS — R079 Chest pain, unspecified: Secondary | ICD-10-CM | POA: Diagnosis not present

## 2015-05-16 DIAGNOSIS — Z8249 Family history of ischemic heart disease and other diseases of the circulatory system: Secondary | ICD-10-CM

## 2015-05-16 DIAGNOSIS — I200 Unstable angina: Secondary | ICD-10-CM | POA: Diagnosis present

## 2015-05-16 DIAGNOSIS — R9439 Abnormal result of other cardiovascular function study: Secondary | ICD-10-CM | POA: Diagnosis present

## 2015-05-16 DIAGNOSIS — Z91041 Radiographic dye allergy status: Secondary | ICD-10-CM

## 2015-05-16 DIAGNOSIS — Z886 Allergy status to analgesic agent status: Secondary | ICD-10-CM

## 2015-05-16 DIAGNOSIS — I2582 Chronic total occlusion of coronary artery: Secondary | ICD-10-CM | POA: Diagnosis not present

## 2015-05-16 DIAGNOSIS — Z955 Presence of coronary angioplasty implant and graft: Secondary | ICD-10-CM

## 2015-05-16 HISTORY — DX: Other seasonal allergic rhinitis: J30.2

## 2015-05-16 HISTORY — DX: Personal history of other diseases of the musculoskeletal system and connective tissue: Z87.39

## 2015-05-16 HISTORY — DX: Unspecified chronic bronchitis: J42

## 2015-05-16 HISTORY — DX: Personal history of other venous thrombosis and embolism: Z86.718

## 2015-05-16 HISTORY — DX: Dorsalgia, unspecified: M54.9

## 2015-05-16 HISTORY — DX: Other chronic pain: G89.29

## 2015-05-16 HISTORY — DX: Atherosclerotic heart disease of native coronary artery without angina pectoris: I25.10

## 2015-05-16 HISTORY — PX: CARDIAC CATHETERIZATION: SHX172

## 2015-05-16 HISTORY — DX: Hyperlipidemia, unspecified: E78.5

## 2015-05-16 LAB — GLUCOSE, CAPILLARY
Glucose-Capillary: 238 mg/dL — ABNORMAL HIGH (ref 65–99)
Glucose-Capillary: 255 mg/dL — ABNORMAL HIGH (ref 65–99)

## 2015-05-16 LAB — POCT ACTIVATED CLOTTING TIME
ACTIVATED CLOTTING TIME: 265 s
ACTIVATED CLOTTING TIME: 399 s
Activated Clotting Time: 363 seconds

## 2015-05-16 SURGERY — LEFT HEART CATH AND CORONARY ANGIOGRAPHY

## 2015-05-16 MED ORDER — HEPARIN SODIUM (PORCINE) 1000 UNIT/ML IJ SOLN
INTRAMUSCULAR | Status: DC | PRN
  Administered 2015-05-16: 4000 [IU] via INTRAVENOUS
  Administered 2015-05-16: 5000 [IU] via INTRAVENOUS
  Administered 2015-05-16: 2000 [IU] via INTRAVENOUS

## 2015-05-16 MED ORDER — BISOPROLOL FUMARATE 5 MG PO TABS
5.0000 mg | ORAL_TABLET | Freq: Every day | ORAL | Status: DC
  Administered 2015-05-16 – 2015-05-18 (×3): 5 mg via ORAL
  Filled 2015-05-16 (×4): qty 1

## 2015-05-16 MED ORDER — NITROGLYCERIN 1 MG/10 ML FOR IR/CATH LAB
INTRA_ARTERIAL | Status: DC | PRN
  Administered 2015-05-16 (×2): 200 ug via INTRACORONARY

## 2015-05-16 MED ORDER — GABAPENTIN 400 MG PO CAPS
800.0000 mg | ORAL_CAPSULE | Freq: Three times a day (TID) | ORAL | Status: DC
  Administered 2015-05-16 – 2015-05-18 (×5): 800 mg via ORAL
  Filled 2015-05-16 (×3): qty 2
  Filled 2015-05-16: qty 8
  Filled 2015-05-16: qty 2
  Filled 2015-05-16: qty 8
  Filled 2015-05-16 (×3): qty 2

## 2015-05-16 MED ORDER — SODIUM CHLORIDE 0.9 % WEIGHT BASED INFUSION
3.0000 mL/kg/h | INTRAVENOUS | Status: DC
  Administered 2015-05-16: 3 mL/kg/h via INTRAVENOUS

## 2015-05-16 MED ORDER — TICAGRELOR 90 MG PO TABS
ORAL_TABLET | ORAL | Status: DC | PRN
Start: 2015-05-16 — End: 2015-05-16
  Administered 2015-05-16: 180 mg via ORAL

## 2015-05-16 MED ORDER — ASPIRIN 81 MG PO CHEW
81.0000 mg | CHEWABLE_TABLET | ORAL | Status: AC
  Administered 2015-05-16 – 2015-05-17 (×2): 81 mg via ORAL

## 2015-05-16 MED ORDER — METHYLPREDNISOLONE SODIUM SUCC 125 MG IJ SOLR
INTRAMUSCULAR | Status: AC
  Filled 2015-05-16: qty 2

## 2015-05-16 MED ORDER — HEPARIN (PORCINE) IN NACL 100-0.45 UNIT/ML-% IJ SOLN
1600.0000 [IU]/h | INTRAMUSCULAR | Status: DC
Start: 2015-05-16 — End: 2015-05-17
  Administered 2015-05-16: 22:00:00 1400 [IU]/h via INTRAVENOUS
  Filled 2015-05-16: qty 250

## 2015-05-16 MED ORDER — ACETAMINOPHEN 325 MG PO TABS: 650.0000 mg | ORAL_TABLET | ORAL | Status: DC | PRN

## 2015-05-16 MED ORDER — TICAGRELOR 90 MG PO TABS
90.0000 mg | ORAL_TABLET | Freq: Two times a day (BID) | ORAL | Status: DC
  Administered 2015-05-16 – 2015-05-18 (×4): 90 mg via ORAL
  Filled 2015-05-16 (×4): qty 1

## 2015-05-16 MED ORDER — ONDANSETRON HCL 4 MG/2ML IJ SOLN: 4.0000 mg | Freq: Four times a day (QID) | INTRAMUSCULAR | Status: DC | PRN

## 2015-05-16 MED ORDER — OXYCODONE-ACETAMINOPHEN 5-325 MG PO TABS
1.0000 | ORAL_TABLET | Freq: Three times a day (TID) | ORAL | Status: DC
  Administered 2015-05-16 – 2015-05-18 (×5): 1 via ORAL
  Filled 2015-05-16 (×5): qty 1

## 2015-05-16 MED ORDER — HEPARIN SODIUM (PORCINE) 1000 UNIT/ML IJ SOLN
INTRAMUSCULAR | Status: AC
  Filled 2015-05-16: qty 1

## 2015-05-16 MED ORDER — FAMOTIDINE IN NACL 20-0.9 MG/50ML-% IV SOLN
INTRAVENOUS | Status: DC | PRN
  Administered 2015-05-16: 20 mg via INTRAVENOUS

## 2015-05-16 MED ORDER — ANGIOPLASTY BOOK
Freq: Once | Status: AC
  Administered 2015-05-16: 21:00:00
  Filled 2015-05-16: qty 1

## 2015-05-16 MED ORDER — BUDESONIDE 0.5 MG/2ML IN SUSP
0.5000 mg | Freq: Two times a day (BID) | RESPIRATORY_TRACT | Status: DC
  Administered 2015-05-16 – 2015-05-18 (×4): 0.5 mg via RESPIRATORY_TRACT
  Filled 2015-05-16 (×4): qty 2

## 2015-05-16 MED ORDER — OXYCODONE HCL 5 MG PO TABS
5.0000 mg | ORAL_TABLET | Freq: Three times a day (TID) | ORAL | Status: DC
  Administered 2015-05-16 – 2015-05-18 (×5): 5 mg via ORAL
  Filled 2015-05-16 (×5): qty 1

## 2015-05-16 MED ORDER — ALBUTEROL SULFATE (2.5 MG/3ML) 0.083% IN NEBU
3.0000 mL | INHALATION_SOLUTION | Freq: Four times a day (QID) | RESPIRATORY_TRACT | Status: DC | PRN
  Administered 2015-05-17 – 2015-05-18 (×2): 3 mL via RESPIRATORY_TRACT
  Filled 2015-05-16 (×2): qty 3

## 2015-05-16 MED ORDER — METHYLPREDNISOLONE SODIUM SUCC 125 MG IJ SOLR
INTRAMUSCULAR | Status: DC | PRN
Start: 2015-05-16 — End: 2015-05-16
  Administered 2015-05-16: 125 mg via INTRAVENOUS

## 2015-05-16 MED ORDER — MIDAZOLAM HCL 2 MG/2ML IJ SOLN
INTRAMUSCULAR | Status: DC | PRN
  Administered 2015-05-16 (×2): 1 mg via INTRAVENOUS

## 2015-05-16 MED ORDER — MIDAZOLAM HCL 2 MG/2ML IJ SOLN
INTRAMUSCULAR | Status: AC
  Filled 2015-05-16: qty 2

## 2015-05-16 MED ORDER — MORPHINE SULFATE (PF) 2 MG/ML IV SOLN: 2.0000 mg | INTRAVENOUS | Status: DC | PRN

## 2015-05-16 MED ORDER — FAMOTIDINE IN NACL 20-0.9 MG/50ML-% IV SOLN
INTRAVENOUS | Status: AC
  Filled 2015-05-16: qty 50

## 2015-05-16 MED ORDER — VERAPAMIL HCL 2.5 MG/ML IV SOLN
INTRAVENOUS | Status: AC
  Filled 2015-05-16: qty 2

## 2015-05-16 MED ORDER — LIDOCAINE HCL (PF) 1 % IJ SOLN
INTRAMUSCULAR | Status: AC
  Filled 2015-05-16: qty 30

## 2015-05-16 MED ORDER — HYDROCHLOROTHIAZIDE 25 MG PO TABS
25.0000 mg | ORAL_TABLET | Freq: Every day | ORAL | Status: DC
  Administered 2015-05-17 – 2015-05-18 (×2): 25 mg via ORAL
  Filled 2015-05-16 (×3): qty 1

## 2015-05-16 MED ORDER — ASPIRIN 81 MG PO CHEW
81.0000 mg | CHEWABLE_TABLET | Freq: Every day | ORAL | Status: DC
  Administered 2015-05-18: 09:00:00 81 mg via ORAL
  Filled 2015-05-16 (×3): qty 1

## 2015-05-16 MED ORDER — TICAGRELOR 90 MG PO TABS
ORAL_TABLET | ORAL | Status: AC
  Filled 2015-05-16: qty 2

## 2015-05-16 MED ORDER — IOHEXOL 350 MG/ML SOLN
INTRAVENOUS | Status: DC | PRN
  Administered 2015-05-16: 235 mL via INTRA_ARTERIAL

## 2015-05-16 MED ORDER — FENTANYL CITRATE (PF) 100 MCG/2ML IJ SOLN
INTRAMUSCULAR | Status: AC
  Filled 2015-05-16: qty 2

## 2015-05-16 MED ORDER — SODIUM CHLORIDE 0.9 % WEIGHT BASED INFUSION
3.0000 mL/kg/h | INTRAVENOUS | Status: AC
  Administered 2015-05-16: 14:00:00 3 mL/kg/h via INTRAVENOUS

## 2015-05-16 MED ORDER — SODIUM CHLORIDE 0.9% FLUSH: 3.0000 mL | INTRAVENOUS | Status: DC | PRN

## 2015-05-16 MED ORDER — VERAPAMIL HCL 2.5 MG/ML IV SOLN
INTRAVENOUS | Status: DC | PRN
  Administered 2015-05-16: 11:00:00 via INTRA_ARTERIAL

## 2015-05-16 MED ORDER — INSULIN ASPART 100 UNIT/ML ~~LOC~~ SOLN
0.0000 [IU] | Freq: Three times a day (TID) | SUBCUTANEOUS | Status: DC
  Administered 2015-05-16: 20:00:00 5 [IU] via SUBCUTANEOUS
  Administered 2015-05-17: 15:00:00 2 [IU] via SUBCUTANEOUS
  Administered 2015-05-17: 5 [IU] via SUBCUTANEOUS
  Administered 2015-05-17: 07:00:00 3 [IU] via SUBCUTANEOUS
  Administered 2015-05-17: 2 [IU] via SUBCUTANEOUS
  Administered 2015-05-18: 07:00:00 3 [IU] via SUBCUTANEOUS

## 2015-05-16 MED ORDER — ATORVASTATIN CALCIUM 80 MG PO TABS
80.0000 mg | ORAL_TABLET | Freq: Every day | ORAL | Status: DC
  Administered 2015-05-16 – 2015-05-17 (×2): 80 mg via ORAL
  Filled 2015-05-16 (×2): qty 1

## 2015-05-16 MED ORDER — HEPARIN (PORCINE) IN NACL 2-0.9 UNIT/ML-% IJ SOLN
INTRAMUSCULAR | Status: AC
  Filled 2015-05-16: qty 1000

## 2015-05-16 MED ORDER — SODIUM CHLORIDE 0.9 % WEIGHT BASED INFUSION
1.0000 mL/kg/h | INTRAVENOUS | Status: DC
  Administered 2015-05-16: 1 mL/kg/h via INTRAVENOUS

## 2015-05-16 MED ORDER — TRAZODONE HCL 50 MG PO TABS
50.0000 mg | ORAL_TABLET | Freq: Every evening | ORAL | Status: DC | PRN
  Administered 2015-05-16 – 2015-05-17 (×2): 50 mg via ORAL
  Filled 2015-05-16 (×2): qty 1

## 2015-05-16 MED ORDER — FENTANYL CITRATE (PF) 100 MCG/2ML IJ SOLN
INTRAMUSCULAR | Status: DC | PRN
  Administered 2015-05-16 (×3): 25 ug via INTRAVENOUS

## 2015-05-16 MED ORDER — OXYCODONE-ACETAMINOPHEN 10-325 MG PO TABS: 1.0000 | ORAL_TABLET | Freq: Three times a day (TID) | ORAL | Status: DC

## 2015-05-16 MED ORDER — DIPHENHYDRAMINE HCL 50 MG/ML IJ SOLN
25.0000 mg | INTRAMUSCULAR | Status: AC
  Administered 2015-05-16: 25 mg via INTRAVENOUS

## 2015-05-16 MED ORDER — LIDOCAINE HCL (PF) 1 % IJ SOLN
INTRAMUSCULAR | Status: DC | PRN
  Administered 2015-05-16: 12:00:00

## 2015-05-16 MED ORDER — SODIUM CHLORIDE 0.9% FLUSH
3.0000 mL | Freq: Two times a day (BID) | INTRAVENOUS | Status: DC
  Administered 2015-05-16 – 2015-05-17 (×2): 3 mL via INTRAVENOUS

## 2015-05-16 MED ORDER — SODIUM CHLORIDE 0.9 % IV SOLN: 250.0000 mL | INTRAVENOUS | Status: DC | PRN

## 2015-05-16 MED ORDER — CLONIDINE HCL 0.1 MG PO TABS
0.3000 mg | ORAL_TABLET | Freq: Two times a day (BID) | ORAL | Status: DC
  Administered 2015-05-16 – 2015-05-18 (×4): 0.3 mg via ORAL
  Filled 2015-05-16 (×4): qty 3

## 2015-05-16 MED ORDER — DIPHENHYDRAMINE HCL 50 MG/ML IJ SOLN
INTRAMUSCULAR | Status: AC
  Filled 2015-05-16: qty 1

## 2015-05-16 MED ORDER — PANTOPRAZOLE SODIUM 40 MG PO TBEC
40.0000 mg | DELAYED_RELEASE_TABLET | Freq: Every day | ORAL | Status: DC
  Administered 2015-05-16 – 2015-05-18 (×3): 40 mg via ORAL
  Filled 2015-05-16 (×3): qty 1

## 2015-05-16 MED ORDER — ASPIRIN 81 MG PO CHEW
CHEWABLE_TABLET | ORAL | Status: AC
  Filled 2015-05-16: qty 1

## 2015-05-16 MED ORDER — LORATADINE 10 MG PO TABS
10.0000 mg | ORAL_TABLET | Freq: Every day | ORAL | Status: DC
  Administered 2015-05-16 – 2015-05-18 (×2): 10 mg via ORAL
  Filled 2015-05-16 (×3): qty 1

## 2015-05-16 SURGICAL SUPPLY — 21 items
BALLN ANGIOSCULPT RX 2.0X10 (BALLOONS) ×2
BALLN EUPHORA RX 2.0X12 (BALLOONS) ×2
BALLN EUPHORA RX 2.5X12 (BALLOONS) ×2
BALLN ~~LOC~~ TREK RX 2.5X6 (BALLOONS) ×2 IMPLANT
BALLOON ANGIOSCULPT RX 2.0X10 (BALLOONS) ×1 IMPLANT
BALLOON EUPHORA RX 2.0X12 (BALLOONS) ×1 IMPLANT
BALLOON EUPHORA RX 2.5X12 (BALLOONS) ×1 IMPLANT
CATH INFINITI 5FR ANG PIGTAIL (CATHETERS) ×2 IMPLANT
CATH OPTITORQUE JACKY 4.0 5F (CATHETERS) ×2 IMPLANT
CATH VISTA GUIDE 6FR XBLAD3.5 (CATHETERS) ×2 IMPLANT
DEVICE RAD COMP TR BAND LRG (VASCULAR PRODUCTS) ×2 IMPLANT
GLIDESHEATH SLEND A-KIT 6F 22G (SHEATH) ×2 IMPLANT
KIT ENCORE 26 ADVANTAGE (KITS) ×2 IMPLANT
KIT HEART LEFT (KITS) ×2 IMPLANT
PACK CARDIAC CATHETERIZATION (CUSTOM PROCEDURE TRAY) ×2 IMPLANT
SYR MEDRAD MARK V 150ML (SYRINGE) IMPLANT
TRANSDUCER W/STOPCOCK (MISCELLANEOUS) ×2 IMPLANT
TUBING CIL FLEX 10 FLL-RA (TUBING) ×2 IMPLANT
WIRE HI TORQ BMW 190CM (WIRE) ×2 IMPLANT
WIRE LUGE 182CM (WIRE) ×2 IMPLANT
WIRE SAFE-T 1.5MM-J .035X260CM (WIRE) ×2 IMPLANT

## 2015-05-16 NOTE — H&P (View-Only) (Signed)
Patient ID: Joshua [name], male   DOB: 03/24/1955, 60 y.o.   MRN: [id number]    Date:  05/15/2015   ID:  Joshua [name], DOB 1955-08-13, MRN [id number]  PCP:  Webb Silversmith, NP  Primary Cardiologist:  Marlou Porch  Chief complaint: Follow-up abnormal stress test   History of Present Illness: Joshua [name] is a 60 y.o. male history of continued tobacco abuse hypertension, COPD, peripheral vascular disease, [place], GERD and arthritis.  He was seen February 20th for evaluation of his heart prior to long-term pain management at the request of Dr. Garnette Gunner. In review of office notes, he went to a pain management clinic on 04/20/15 and had diagnostic studies done there and he was told that "heart will explode "and they did not prescribe any pain medication unless he had a letter from his primary care physician saying it is okay to proceed with pain medication. He underwent autonomic balance analysis. 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 Clinical protection of the heart is recommended for example beta blockers, ace inhibitors, ARB or calcium channel blockers and additional cardiovascular workouts. Symptoms such as on the static hypotension, heat intolerance, vagal dystonia. This may be a sign of mental fatigue, chronic stress and possibly presence of Chronic health condition affecting. Subhepatic nervous system. Deep breathing showed normal parasympathetic response to stimulation. Valsalva showed hyperactive sympathetic response to stimulation and could be a cause for potential hypertension. Standing indicated parasympathetic surge. Heart rate upon standing is considered low.  He underwent nuclear stress testing on February 22 which was abnormal. He presents today for discussion of heart catheterization. He's not having any chest pain today however, he is very  wheezy with some shortness of breath. He has not taken his albuterol.  He referred port severe burning in his feet like they are on fire and perhaps claudication  The patient currently denies nausea, vomiting, fever, orthopnea, dizziness, PND, cough, congestion, abdominal pain, hematochezia, melena, lower extremity edema.  Wt Readings from Last 3 Encounters:  05/15/15 238 lb 6.4 oz (108.138 kg)  05/10/15 235 lb (106.595 kg)  05/09/15 233 lb (105.688 kg)     Past Medical History  Diagnosis Date  . Hypertension   . COPD (chronic obstructive pulmonary disease) ([place])   . Spinal disease   . Allergy   . Positive TB test   . Phlebitis   . Shortness of breath   . GERD (gastroesophageal reflux disease)   . Arthritis    Past Surgical History  Procedure Laterality Date  . Arterial bypass surgry    . Cervical spine surgery    . Back surgery    . Anterior cervical decomp/discectomy fusion N/A 10/06/2012    Procedure: ANTERIOR CERVICAL DECOMPRESSION/DISCECTOMY FUSION 1 LEVEL;  Surgeon: [name], MD;  Location: [facility];  Service: Neurosurgery;  Laterality: N/A;  C7T1 anterior cervical decompression with fusion plating and bonegraft with Stalif-C (Zimmer)     Current Outpatient Prescriptions  Medication Sig Dispense Refill  . albuterol (PROVENTIL HFA;VENTOLIN HFA) 108 (90 Base) MCG/ACT inhaler Inhale 2 puffs into the lungs every 6 (six) hours as needed for wheezing or shortness of breath.    . cetirizine (ZYRTEC ALLERGY) 10 MG tablet Take 1 tablet (10 mg total) by mouth daily. (Patient taking differently: Take 10 mg by mouth daily as needed for allergies. ) 90 tablet 1  . cloNIDine (CATAPRES) 0.3 MG  tablet Take 1 tablet (0.3 mg total) by mouth 2 (two) times daily. 180 tablet 1  . gabapentin (NEURONTIN) 800 MG tablet Take 1 tablet (800 mg total) by mouth 3 (three) times daily. 270 tablet 1  . hydrochlorothiazide (HYDRODIURIL) 25 MG tablet Take 1 tablet (25 mg total) by mouth daily.  90 tablet 1  . omeprazole (PRILOSEC) 20 MG capsule Take 1 capsule (20 mg total) by mouth daily. 90 capsule 1  . traZODone (DESYREL) 50 MG tablet Take 1 tablet (50 mg total) by mouth at bedtime as needed for sleep. 90 tablet 1  . beclomethasone (QVAR) 80 MCG/ACT inhaler Inhale 2 puffs into the lungs 2 (two) times daily.    . Omega-3 Fatty Acids (FISH OIL) 1200 MG CAPS Take 1 capsule by mouth 3 (three) times daily.    [name] oxyCODONE-acetaminophen (PERCOCET) 10-325 MG tablet Take 1 tablet by mouth every 8 (eight) hours. Reported on 05/15/2015  0  . [DISCONTINUED] lisinopril (PRINIVIL,ZESTRIL) 40 MG tablet TAKE 1 TABLET (40 MG TOTAL) BY MOUTH DAILY. 90 tablet 1   Current Facility-Administered Medications  Medication Dose Route Frequency Provider Last Rate Last Dose  . levalbuterol (XOPENEX) nebulizer solution 0.63 mg  0.63 mg Nebulization Once Brett Canales, PA-C        Allergies:    Allergies  Allergen Reactions  . Lisinopril Anaphylaxis  . Contrast Media [Iodinated Diagnostic Agents] Hives and Itching    Pt can use if taking benadryl   . Ibuprofen Other (See Comments)    Internal bleeding    Social History:  The patient  reports that he has been smoking Cigarettes.  He has a 44 pack-year smoking history. He has never used smokeless tobacco. He reports that he does not drink alcohol or use illicit drugs.   Family history:   Family History  Problem Relation Age of Onset  . Stomach cancer Mother     Deceased, 11s  . Hypertension Mother   . Hypertension Father   . Stroke Father   . Diabetes Neg Hx   . Early death Neg Hx   . Heart attack Father     Deceased, 22  . Healthy Daughter     ROS:  Please see the history of present illness.  All other systems reviewed and negative.   PHYSICAL EXAM: VS:  BP 130/80 mmHg  Pulse 88  Ht '5\' 9"'$  (1.753 m)  Wt 238 lb 6.4 oz (108.138 kg)  BMI 35.19 kg/m2 Well nourished, well developed, in no acute distress HEENT: Pupils are equal round react to  light accommodation extraocular movements are intact.  Neck: no JVDNo cervical lymphadenopathy. Cardiac: Regular rate and rhythm without murmurs rubs or gallops. Lungs:  Very wheezy on exam Abd: soft, nontender, positive bowel sounds all quadrants, no hepatosplenomegaly Ext: no lower extremity edema.  2+ radial and 1+ left posterior tibialis otherwise very poor distal pulses. 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)   Tobacco use (Chronic)   Peripheral vascular disease (HCC) (Chronic)   Essential hypertension (Chronic)   COPD (chronic obstructive pulmonary disease) (HCC)   Relevant Medications   albuterol (PROVENTIL HFA;VENTOLIN HFA) 108 (90 Base) MCG/ACT inhaler   levalbuterol (XOPENEX) nebulizer solution 0.63 mg (Start on 05/15/2015  5:15 PM)   Other Relevant Orders   Pulmonary function test   Chest pain with high risk  for cardiac etiology (Chronic)   Abnormal nuclear stress test    Other Visit Diagnoses    Abnormal stress test    -  Primary    Relevant Medications    levalbuterol (XOPENEX) nebulizer solution 0.63 mg (Start on 05/15/2015  5:15 PM)    Pre-procedure lab exam        Relevant Orders    Basic metabolic panel (Completed)    INR/PT (Completed)    CBC w/Diff (Completed)    PVD (peripheral vascular disease) ([place])        Relevant Orders    LE ART SEG MULTI (Segm & LE [facility]aud[facility])       Mr. Nooney will be scheduled for left heart catheterization this coming Wednesday.  The patient understands that risks include but are not limited to stroke (1 in 1000), death (1 in 94), kidney failure [usually temporary] (1 in 500), bleeding (1 in 200), allergic reaction [possibly serious] (1 in 200). The patient understands and is willing to proceed.  He reports getting hives and itching after getting contrast and that he'll be okay with  Benadryl prior to the cath.  We also obtained but discuss tobacco cessation again.  His been complaining of burning in his feet which is likely related to his peripheral nephropathy from diabetes however, he has not had any evaluation of his peripheral vascular disease in 10 years. He has very poor distal pulses. Will get lower extremity arterial Dopplers.  He is very wheezy on his exam recommended he go home and use his albuterol inhaler. I will order precath Xopenex nebulizers. His COPD does not seem to be very well controlled and is only on albuterol inhaler.  I ordered Pulmonary function tests.    Blood pressure controlled.

## 2015-05-16 NOTE — Progress Notes (Signed)
TR BAND REMOVAL  LOCATION:    right radial  DEFLATED PER PROTOCOL:    Yes.    TIME BAND OFF / DRESSING APPLIED:    1620   SITE UPON ARRIVAL:    Level 0  SITE AFTER BAND REMOVAL:    Level 0  CIRCULATION SENSATION AND MOVEMENT:    Within Normal Limits   Yes.    COMMENTS:   Tolerated procedure well

## 2015-05-16 NOTE — Progress Notes (Signed)
ANTICOAGULATION CONSULT NOTE - Initial Consult  Pharmacy Consult for heparin Indication: ACS/STEMI   Allergies  Allergen Reactions  . Lisinopril Anaphylaxis  . Contrast Media [Iodinated Diagnostic Agents] Hives and Itching    Pt can use if taking benadryl   . Ibuprofen Other (See Comments)    Internal bleeding    Patient Measurements: Height: '5\' 9"'$  (175.3 cm) Weight: 238 lb (107.956 kg) IBW/kg (Calculated) : 70.7 Heparin Dosing Weight: 94.2 kg  Vital Signs: Temp: 97 F (36.1 C) (02/28 1507) Temp Source: Oral (02/28 1507) BP: 144/85 mmHg (02/28 1507) Pulse Rate: 70 (02/28 1507)  Labs:  Recent Labs  05/15/15 1017  HGB 16.9  HCT 49.5  PLT 281  LABPROT 12.1  INR 0.89  CREATININE 0.96    Estimated Creatinine Clearance: 100.3 mL/min (by C-G formula based on Cr of 0.96).   Medical History: Past Medical History  Diagnosis Date  . Hypertension   . COPD (chronic obstructive pulmonary disease) ([place])   . Spinal disease   . Seasonal allergies   . Positive TB test   . Phlebitis   . Shortness of breath   . GERD (gastroesophageal reflux disease)   . Hyperlipidemia   . H/O blood clots     "had them in my back; put filter in before one of my neck ORs"  . Chronic bronchitis ([place])   . Arthritis     "back" (05/16/2015)  . Chronic back pain     "all my back"  . History of gout     Medications:  Facility-administered medications prior to admission  Medication Dose Route Frequency Provider Last Rate Last Dose  . levalbuterol (XOPENEX) nebulizer solution 0.63 mg  0.63 mg Nebulization Once Brett Canales, PA-C       Prescriptions prior to admission  Medication Sig Dispense Refill Last Dose  . albuterol (PROVENTIL HFA;VENTOLIN HFA) 108 (90 Base) MCG/ACT inhaler Inhale 2 puffs into the lungs every 6 (six) hours as needed for wheezing or shortness of breath.   05/16/2015 at 0700  . cetirizine (ZYRTEC ALLERGY) 10 MG tablet Take 1 tablet (10 mg total) by mouth daily. (Patient  taking differently: Take 10 mg by mouth daily as needed for allergies. ) 90 tablet 1 05/15/2015 at Unknown time  . cloNIDine (CATAPRES) 0.3 MG tablet Take 1 tablet (0.3 mg total) by mouth 2 (two) times daily. 180 tablet 1 05/16/2015 at 0700  . gabapentin (NEURONTIN) 800 MG tablet Take 1 tablet (800 mg total) by mouth 3 (three) times daily. 270 tablet 1 05/16/2015 at 0700  . hydrochlorothiazide (HYDRODIURIL) 25 MG tablet Take 1 tablet (25 mg total) by mouth daily. 90 tablet 1 05/16/2015 at 0700  . omeprazole (PRILOSEC) 20 MG capsule Take 1 capsule (20 mg total) by mouth daily. 90 capsule 1 Past Week at Unknown time  . traZODone (DESYREL) 50 MG tablet Take 1 tablet (50 mg total) by mouth at bedtime as needed for sleep. 90 tablet 1 05/15/2015 at 2000  . beclomethasone (QVAR) 80 MCG/ACT inhaler Inhale 2 puffs into the lungs 2 (two) times daily.   Unknown at Unknown time  . Omega-3 Fatty Acids (FISH OIL) 1200 MG CAPS Take 1 capsule by mouth 3 (three) times daily.   Unknown at Unknown time  . oxyCODONE-acetaminophen (PERCOCET) 10-325 MG tablet Take 1 tablet by mouth every 8 (eight) hours. Reported on 05/15/2015  0 Unknown at Unknown time    Assessment: 60 yo M s/p cath - to return to cath lab tomorrow  for rotational atherectomy and stenting of the mid and distal LAD lesions.  Pharmacy consulted to start heparin drip for ACS/STEMI 6 hours after TR band removed.  TR band removed 1622.  HDW 94.2 kg. No bleeding reported.   Goal of Therapy:  Heparin level 0.3-0.7 units/ml Monitor platelets by anticoagulation protocol: Yes   Plan:  -start heparin drip ~ 6 hrs s/p TR band removal at 2230 tonight at rate of 1400 units/hr and check AM HL - daily HL/CBC while on heparin  Eudelia Bunch, Pharm.D. [phone] 05/16/2015 4:32 PM

## 2015-05-16 NOTE — Brief Op Note (Signed)
   NAME:  Joshua [name]   MRN: [id number] DOB:  09/01/1955   ADMIT DATE: 05/16/2015  Brief Cardia Catheterization Note:  Indication: 1. Chest Pain with High Risk for Cardiac Etiology/Unstable Angina 2. Abnormal Nuclear Stress Test  Procedures: 1. LEFT Catheterization with Native Coroanry Angiography via Right Radial Artery access 2. Percutaneous Transluminal Coronary Angioplasty - with suboptimal results of mid LAD 90% heavily calcified lesion (did not appear to be  As difficult on initial evaluation), unable to reach the more distal 80% distal LAD lesion   Medications:  2 mg Versed IV; 75 mcg Fentanyl IV  180 mg Brilinta  25 mg IV Benadryl, 125 mg Solu-Medrol, 20 mg IV Pepcid - for contrast hypersensitivity  Verapamil 3 mg (radial cocktail)  Heparin total 11,000 units IV  Omnipaque contrast torn 35 mL  Impression:  Severe 2 vessel disease with 100% occluded second obtuse marginal branch that fills via collaterals from distal LAD and diagonal branches - this is likely the culprit for what appears to be old infarct on stress test  There are tandem lesions in the mid then distal LAD of 90%, severely calcified eccentric and focal 80% distal (napkin ring)  Unable to expand balloon at the mid LAD lesion. Several different balloons were tried.  Normal RCA  Moderate caliber OM1 does not cover large distribution, it appears OM 2 was a very major vessel.  Hyperdynamic LV function with EF of 65%. LVEDP is 16  Attempted, but unsuccessful/suboptimal PTCA attempt. - Will likely require rotational atherectomy  Recommendations:  The patient will be admitted overnight for hydration. He will reschedule for tomorrow morning with Dr. Martinique for rotational atherectomy and stenting of the mid and distal LAD lesions.  Continue Brilinta and aspirin. Will restart IV heparin 8 hours after TR band removal  We'll add bisoprolol for beta blocker. He will also need statin added    Full  note to follow  [name], M.D., M.S. Interventional Cardiologist   Pager # [phone] Phone # [phone] [address] [place], [place] [address]  05/16/2015 12:28 PM

## 2015-05-16 NOTE — Interval H&P Note (Signed)
History and Physical Interval Note:  05/16/2015 10:33 AM  Joshua [name]  has presented today for surgery, with the diagnosis of abnormal stress test & EXERTIONAL DYSPNEA. The various methods of treatment have been discussed with the patient and family. After consideration of risks, benefits and other options for treatment, the patient has consented to  Procedure(s): Left Heart Cath and Coronary Angiography (N/A) as a surgical intervention .  The patient's history has been reviewed, patient examined, no change in status, stable for surgery.  I have reviewed the patient's chart and labs.  Questions were answered to the patient's satisfaction.    Cath Lab Visit (complete for each Cath Lab visit)  Clinical Evaluation Leading to the Procedure:   ACS: No.  Non-ACS:    Anginal Classification: CCS II  Anti-ischemic medical therapy: Minimal Therapy (1 class of medications)  Non-Invasive Test Results: Intermediate-risk stress test findings: cardiac mortality 1-3%/year  Prior CABG: No previous CABG  Ischemic Symptoms? CCS II (Slight limitation of ordinary activity) Anti-ischemic Medical Therapy? Minimal Therapy (1 class of medications) Non-invasive Test Results? Intermediate-risk stress test findings: cardiac mortality 1-3%/year Prior CABG? No Previous CABG   Patient Information:   1-2V CAD, no prox LAD  U (5)  Indication: 16; Score: 5   Patient Information:   CTO of 1 vessel, no other CAD  U (4)  Indication: 26; Score: 4   Patient Information:   1V CAD with prox LAD  U (6)  Indication: 32; Score: 6   Patient Information:   2V-CAD with prox LAD  A (7)  Indication: 38; Score: 7   Patient Information:   3V-CAD without LMCA  A (7)  Indication: 44; Score: 7   Patient Information:   3V-CAD without LMCA With Abnormal LV systolic function  A (9)  Indication: 48; Score: 9   Patient Information:   LMCA-CAD  A (9)  Indication: 49; Score: 9   Patient  Information:   2V-CAD with prox LAD PCI  A (7)  Indication: 62; Score: 7   Patient Information:   2V-CAD with prox LAD CABG  A (8)  Indication: 62; Score: 8   Patient Information:   3V-CAD without LMCA With Low CAD burden(i.e., 3 focal stenoses, low SYNTAX score) PCI  A (7)  Indication: 63; Score: 7   Patient Information:   3V-CAD without LMCA With Low CAD burden(i.e., 3 focal stenoses, low SYNTAX score) CABG  A (9)  Indication: 63; Score: 9   Patient Information:   3V-CAD without LMCA E06c - Intermediate-high CAD burden (i.e., multiple diffuse lesions, presence of CTO, or high SYNTAX score) PCI  U (4)  Indication: 64; Score: 4   Patient Information:   3V-CAD without LMCA E06c - Intermediate-high CAD burden (i.e., multiple diffuse lesions, presence of CTO, or high SYNTAX score) CABG  A (9)  Indication: 64; Score: 9   Patient Information:   LMCA-CAD With Isolated LMCA stenosis  PCI  U (6)  Indication: 65; Score: 6   Patient Information:   LMCA-CAD With Isolated LMCA stenosis  CABG  A (9)  Indication: 65; Score: 9   Patient Information:   LMCA-CAD Additional CAD, low CAD burden (i.e., 1- to 2-vessel additional involvement, low SYNTAX score) PCI  U (5)  Indication: 66; Score: 5   Patient Information:   LMCA-CAD Additional CAD, low CAD burden (i.e., 1- to 2-vessel additional involvement, low SYNTAX score) CABG  A (9)  Indication: 66; Score: 9   Patient Information:   LMCA-CAD  Additional CAD, intermediate-high CAD burden (i.e., 3-vessel involvement, presence of CTO, or high SYNTAX score) PCI  I (3)  Indication: 67; Score: 3   Patient Information:   LMCA-CAD Additional CAD, intermediate-high CAD burden (i.e., 3-vessel involvement, presence of CTO, or high SYNTAX score) CABG  A (9)  Indication: 67; Score: 9   HARDING, DAVID W

## 2015-05-17 ENCOUNTER — Encounter (HOSPITAL_COMMUNITY)
Admission: AD | Disposition: A | Discharge status: Home / Self Care | Payer: Self-pay | Source: Ambulatory Visit | Attending: Cardiology

## 2015-05-17 ENCOUNTER — Encounter (HOSPITAL_COMMUNITY): Payer: Self-pay | Admitting: Cardiology

## 2015-05-17 DIAGNOSIS — R931 Abnormal findings on diagnostic imaging of heart and coronary circulation: Secondary | ICD-10-CM

## 2015-05-17 DIAGNOSIS — I739 Peripheral vascular disease, unspecified: Secondary | ICD-10-CM

## 2015-05-17 DIAGNOSIS — R079 Chest pain, unspecified: Secondary | ICD-10-CM

## 2015-05-17 DIAGNOSIS — Z720 Tobacco use: Secondary | ICD-10-CM

## 2015-05-17 DIAGNOSIS — I10 Essential (primary) hypertension: Secondary | ICD-10-CM

## 2015-05-17 HISTORY — PX: CARDIAC CATHETERIZATION: SHX172

## 2015-05-17 LAB — POCT ACTIVATED CLOTTING TIME
ACTIVATED CLOTTING TIME: 430 s
ACTIVATED CLOTTING TIME: 595 s

## 2015-05-17 LAB — BASIC METABOLIC PANEL
Anion gap: 14 (ref 5–15)
BUN: 20 mg/dL (ref 6–20)
CALCIUM: 8.8 mg/dL — AB (ref 8.9–10.3)
CHLORIDE: 102 mmol/L (ref 101–111)
CO2: 23 mmol/L (ref 22–32)
CREATININE: 1.15 mg/dL (ref 0.61–1.24)
GFR calc non Af Amer: >60 mL/min (ref >60)
Glucose, Bld: 165 mg/dL — ABNORMAL HIGH (ref 65–99)
Potassium: 3.7 mmol/L (ref 3.5–5.1)
SODIUM: 139 mmol/L (ref 135–145)

## 2015-05-17 LAB — CBC
HEMATOCRIT: 47.5 % (ref 39.0–52.0)
Hemoglobin: 16.8 g/dL (ref 13.0–17.0)
MCH: 29.9 pg (ref 26.0–34.0)
MCHC: 35.4 g/dL (ref 30.0–36.0)
MCV: 84.7 fL (ref 78.0–100.0)
PLATELETS: 273 10*3/uL (ref 150–400)
RBC: 5.61 MIL/uL (ref 4.22–5.81)
RDW: 15.8 % — AB (ref 11.5–15.5)
WBC: 27.7 10*3/uL — ABNORMAL HIGH (ref 4.0–10.5)

## 2015-05-17 LAB — GLUCOSE, CAPILLARY
GLUCOSE-CAPILLARY: 170 mg/dL — AB (ref 65–99)
GLUCOSE-CAPILLARY: 130 mg/dL — AB (ref 65–99)
Glucose-Capillary: 115 mg/dL — ABNORMAL HIGH (ref 65–99)
Glucose-Capillary: 143 mg/dL — ABNORMAL HIGH (ref 65–99)
Glucose-Capillary: 239 mg/dL — ABNORMAL HIGH (ref 65–99)

## 2015-05-17 LAB — HEPARIN LEVEL (UNFRACTIONATED): Heparin Unfractionated: 0.23 IU/mL — ABNORMAL LOW (ref 0.30–0.70)

## 2015-05-17 SURGERY — CORONARY STENT INTERVENTION

## 2015-05-17 MED ORDER — VERAPAMIL HCL 2.5 MG/ML IV SOLN
INTRA_ARTERIAL | Status: DC | PRN
  Administered 2015-05-17: 13:00:00 via INTRACORONARY

## 2015-05-17 MED ORDER — LIDOCAINE HCL (PF) 1 % IJ SOLN
INTRAMUSCULAR | Status: DC | PRN
  Administered 2015-05-17: 5 mL

## 2015-05-17 MED ORDER — HEPARIN SODIUM (PORCINE) 1000 UNIT/ML IJ SOLN
INTRAMUSCULAR | Status: AC
  Filled 2015-05-17: qty 2

## 2015-05-17 MED ORDER — NITROGLYCERIN 1 MG/10 ML FOR IR/CATH LAB
INTRA_ARTERIAL | Status: DC | PRN
  Administered 2015-05-17 (×2): 200 ug via INTRACORONARY

## 2015-05-17 MED ORDER — SODIUM CHLORIDE 0.9% FLUSH: 3.0000 mL | INTRAVENOUS | Status: DC | PRN

## 2015-05-17 MED ORDER — VERAPAMIL HCL 2.5 MG/ML IV SOLN
INTRAVENOUS | Status: AC
  Filled 2015-05-17: qty 6

## 2015-05-17 MED ORDER — SODIUM CHLORIDE 0.9% FLUSH
3.0000 mL | Freq: Two times a day (BID) | INTRAVENOUS | Status: DC
  Administered 2015-05-17: 08:00:00 via INTRAVENOUS

## 2015-05-17 MED ORDER — HEPARIN (PORCINE) IN NACL 2-0.9 UNIT/ML-% IJ SOLN
INTRAMUSCULAR | Status: DC | PRN
  Administered 2015-05-17: 10 mL via INTRA_ARTERIAL

## 2015-05-17 MED ORDER — FENTANYL CITRATE (PF) 100 MCG/2ML IJ SOLN
INTRAMUSCULAR | Status: DC | PRN
  Administered 2015-05-17: 25 ug via INTRAVENOUS

## 2015-05-17 MED ORDER — HEPARIN (PORCINE) IN NACL 2-0.9 UNIT/ML-% IJ SOLN
INTRAMUSCULAR | Status: DC | PRN
Start: 2015-05-17 — End: 2015-05-17
  Administered 2015-05-17: 1500 mL

## 2015-05-17 MED ORDER — SODIUM CHLORIDE 0.9 % WEIGHT BASED INFUSION
1.0000 mL/kg/h | INTRAVENOUS | Status: DC
  Administered 2015-05-17 (×2): 1 mL/kg/h via INTRAVENOUS

## 2015-05-17 MED ORDER — FAMOTIDINE IN NACL 20-0.9 MG/50ML-% IV SOLN
20.0000 mg | Freq: Once | INTRAVENOUS | Status: AC
  Administered 2015-05-17: 12:00:00 20 mg via INTRAVENOUS
  Filled 2015-05-17: qty 50

## 2015-05-17 MED ORDER — SODIUM CHLORIDE 0.9 % IV SOLN: 250.0000 mL | INTRAVENOUS | Status: DC | PRN

## 2015-05-17 MED ORDER — SODIUM CHLORIDE 0.9 % WEIGHT BASED INFUSION: 1.0000 mL/kg/h | INTRAVENOUS | Status: AC

## 2015-05-17 MED ORDER — HEPARIN SODIUM (PORCINE) 1000 UNIT/ML IJ SOLN
INTRAMUSCULAR | Status: DC | PRN
  Administered 2015-05-17: 8000 [IU] via INTRAVENOUS

## 2015-05-17 MED ORDER — HEPARIN (PORCINE) IN NACL 2-0.9 UNIT/ML-% IJ SOLN
INTRAMUSCULAR | Status: AC
  Filled 2015-05-17: qty 1500

## 2015-05-17 MED ORDER — MIDAZOLAM HCL 2 MG/2ML IJ SOLN
INTRAMUSCULAR | Status: DC | PRN
  Administered 2015-05-17: 1 mg via INTRAVENOUS

## 2015-05-17 MED ORDER — METHYLPREDNISOLONE SODIUM SUCC 125 MG IJ SOLR
125.0000 mg | Freq: Once | INTRAMUSCULAR | Status: AC
  Administered 2015-05-17: 125 mg via INTRAVENOUS
  Filled 2015-05-17: qty 2

## 2015-05-17 MED ORDER — MIDAZOLAM HCL 2 MG/2ML IJ SOLN
INTRAMUSCULAR | Status: AC
  Filled 2015-05-17: qty 2

## 2015-05-17 MED ORDER — DIPHENHYDRAMINE HCL 50 MG/ML IJ SOLN
25.0000 mg | Freq: Once | INTRAMUSCULAR | Status: AC
  Administered 2015-05-17: 25 mg via INTRAVENOUS
  Filled 2015-05-17: qty 1

## 2015-05-17 MED ORDER — SODIUM CHLORIDE 0.9 % WEIGHT BASED INFUSION
3.0000 mL/kg/h | INTRAVENOUS | Status: DC
  Administered 2015-05-17: 06:00:00 3 mL/kg/h via INTRAVENOUS

## 2015-05-17 MED ORDER — LIDOCAINE HCL (PF) 1 % IJ SOLN
INTRAMUSCULAR | Status: AC
  Filled 2015-05-17: qty 30

## 2015-05-17 MED ORDER — FENTANYL CITRATE (PF) 100 MCG/2ML IJ SOLN
INTRAMUSCULAR | Status: AC
  Filled 2015-05-17: qty 2

## 2015-05-17 MED ORDER — NITROGLYCERIN IN D5W 200-5 MCG/ML-% IV SOLN
INTRAVENOUS | Status: AC
  Filled 2015-05-17: qty 250

## 2015-05-17 SURGICAL SUPPLY — 17 items
BALLN ~~LOC~~ EMERGE MR 2.5X12 (BALLOONS) ×2
BALLN ~~LOC~~ EMERGE MR 3.25X12 (BALLOONS) ×2
BALLOON ~~LOC~~ EMERGE MR 2.5X12 (BALLOONS) ×1 IMPLANT
BALLOON ~~LOC~~ EMERGE MR 3.25X12 (BALLOONS) ×1 IMPLANT
CATH ROTALINK PLUS 1.50MM (BURR) ×2 IMPLANT
CATH VISTA GUIDE 6FR XBLAD3.5 (CATHETERS) ×2 IMPLANT
CUTTING BAL FLEXTOME RX2.25X10 (BALLOONS) ×2 IMPLANT
GLIDESHEATH SLEND SS 6F .021 (SHEATH) ×2 IMPLANT
KIT ENCORE 26 ADVANTAGE (KITS) ×2 IMPLANT
KIT HEART LEFT (KITS) ×2 IMPLANT
LUBRICANT ROTAGLIDE 20CC VIAL (MISCELLANEOUS) ×2 IMPLANT
PACK CARDIAC CATHETERIZATION (CUSTOM PROCEDURE TRAY) ×2 IMPLANT
STENT SYNERGY DES 3X16 (Permanent Stent) ×4 IMPLANT
TRANSDUCER W/STOPCOCK (MISCELLANEOUS) ×2 IMPLANT
TUBING CIL FLEX 10 FLL-RA (TUBING) ×2 IMPLANT
WIRE ROTA FLOPPY .009X325CM (WIRE) ×2 IMPLANT
WIRE SAFE-T 1.5MM-J .035X260CM (WIRE) ×2 IMPLANT

## 2015-05-17 NOTE — H&P (View-Only) (Signed)
Cardiologist: Marlou Porch Subjective:  Overall doing well currently. No chest pain. No shortness of breath.  Objective:  Vital Signs in the last 24 hours: Temp:  [97 F (36.1 C)-98.3 F (36.8 C)] 97.5 F (36.4 C) (03/01 0640) Pulse Rate:  [0-86] 58 (03/01 0749) Resp:  [0-25] 16 (03/01 0749) BP: (110-164)/(60-92) 135/81 mmHg (03/01 0749) SpO2:  [0 %-100 %] 98 % (03/01 0830) Weight:  [237 lb 7 oz (107.7 kg)] 237 lb 7 oz (107.7 kg) (03/01 0640)  Intake/Output from previous day: 02/28 0701 - 03/01 0700 In: 1032.5 [P.O.:600; I.V.:432.5] Out: 1400 [Urine:1400]   Physical Exam: General: Well developed, well nourished, in no acute distress. Head:  Normocephalic and atraumatic. Lungs: Clear to auscultation and percussion. Heart: Normal S1 and S2.  No murmur, rubs or gallops.  Abdomen: soft, non-tender, positive bowel sounds. Abdominal scar Extremities: No clubbing or cyanosis. No edema. Neurologic: Alert and oriented x 3.    Lab Results:  Recent Labs  05/15/15 1017 05/17/15 0556  WBC 12.8* 27.7*  HGB 16.9 16.8  PLT 281 273    Recent Labs  05/15/15 1017 05/17/15 0556  NA 138 139  K 4.2 3.7  CL 98 102  CO2 28 23  GLUCOSE 84 165*  BUN 13 20  CREATININE 0.96 1.15   Telemetry: No adverse arrhythmias Personally viewed.   EKG:  No new EKG Personally viewed.  Cardiac Studies:  Cardiac catheterization 05/16/15  Prox LAD to Mid LAD lesion, 90% stenosed. Post intervention, there remains 80% residual calcific stenosis.  Mid LAD lesion, 80% stenosed. Dist LAD lesion, 55% stenosed.  Prox Cx lesion, 100% stenosed. Prox Cx to Mid Cx lesion, 99% stenosed. Ost 2nd Mrg to 2nd Mrg lesion, 99% stenosed. -- Seen via retrograde filling from LAD/diagonal-OM collaterals  There is hyperdynamic left ventricular systolic function.  Elevated LVEDP   Severe 2 site lesion in the LAD. Very rigid lesions that would not yield to Cutting Balloon or noncompliant balloon angioplasty. These  lesions are likely best treated with rotational atherectomy after review with Dr. Peter Martinique.   Plan:  Monitor overnight following PTCA. We'll plan for return to the Cath Lab tomorrow for rotational atherectomy of the LAD with stenting of the 2 lesions. Advil for Dr. Peter Martinique.  Continue Brilinta and aspirin. Will restart IV heparin 8 hours after TR band removal  Add bisoprolol for beta blocker.   He will also need statin added   Expect they'll be able to be discharged following day after rotational atherectomy/PCI.  Meds: Scheduled Meds: . aspirin  81 mg Oral Daily  . atorvastatin  80 mg Oral q1800  . bisoprolol  5 mg Oral Daily  . budesonide  0.5 mg Nebulization BID  . cloNIDine  0.3 mg Oral BID  . gabapentin  800 mg Oral TID  . hydrochlorothiazide  25 mg Oral Daily  . insulin aspart  0-15 Units Subcutaneous TID WC  . loratadine  10 mg Oral Daily  . oxyCODONE-acetaminophen  1 tablet Oral 3 times per day   And  . oxyCODONE  5 mg Oral 3 times per day  . pantoprazole  40 mg Oral Daily  . sodium chloride flush  3 mL Intravenous Q12H  . sodium chloride flush  3 mL Intravenous Q12H  . ticagrelor  90 mg Oral BID   Continuous Infusions: . [START ON 05/18/2015] sodium chloride 3 mL/kg/hr (05/17/15 0615)   Followed by  . [START ON 05/18/2015] sodium chloride 1 mL/kg/hr (05/17/15 0715)  .  heparin 1,400 Units/hr (05/16/15 2215)   PRN Meds:.sodium chloride, sodium chloride, acetaminophen, albuterol, morphine injection, ondansetron (ZOFRAN) IV, sodium chloride flush, sodium chloride flush, traZODone  Assessment/Plan:  Principal Problem:   Abnormal nuclear stress test Active Problems:   Essential hypertension   Tobacco use   Type 2 diabetes mellitus with peripheral neuropathy (HCC)   Chest pain with high risk for cardiac etiology   Abnormal stress test   Unstable angina (HCC)  CAD with abnormal NUC stress - plan rotational arthrectomy with Dr. Martinique (LAD) - Brilinta  ASA - Bisoprolol - atorvastatin 80  Pain mgt - came to me originally for clearance/were stratification prior to pain management after he had an autonomic diagnostic analysis done at pain management clinic.  - This resulted in nuclear stress test because of periodic chest pressure which demonstrated abnormal findings.  Peripheral vascular disease - Questionable femoropopliteal bypass done in [place], abdominal scar noted  Essential hypertension - Currently controlled, medications reviewed  Tobacco use - Encourage cessation  Contrast hypersensitivity - He was premedicated prior to cardiac catheterization. - This is why WBC is elevated - No reaction to contrast noted.  - Will premed again. Discussed with Anderson Malta in cath lab.    Cydney Alvarenga, [place] 05/17/2015, 8:42 AM

## 2015-05-17 NOTE — Progress Notes (Signed)
TR Band removed and drsg applied. Site is level 0. Palpable rt radial and ulnar 2t. Pt is aware of mobility restrictions.

## 2015-05-17 NOTE — Progress Notes (Signed)
ANTICOAGULATION CONSULT NOTE - Follow Up Consult  Pharmacy Consult for heparin Indication: CAD   Labs:  Recent Labs  05/15/15 1017 05/17/15 0556  HGB 16.9 16.8  HCT 49.5 47.5  PLT 281 273  LABPROT 12.1  --   INR 0.89  --   HEPARINUNFRC  --  0.23*  CREATININE 0.96 1.15     Assessment: 60yo male subtherapeutic on heparin after started post-cath.  Goal of Therapy:  Heparin level 0.3-0.7 units/ml   Plan:  Will increase heparin gtt by 2 units/kg/hr to 1600 units/hr and check level in 6hr.  Wynona Neat, PharmD, BCPS  05/17/2015,7:29 AM

## 2015-05-17 NOTE — Progress Notes (Addendum)
Cardiologist: Marlou Porch Subjective:  Overall doing well currently. No chest pain. No shortness of breath.  Objective:  Vital Signs in the last 24 hours: Temp:  [97 F (36.1 C)-98.3 F (36.8 C)] 97.5 F (36.4 C) (03/01 0640) Pulse Rate:  [0-86] 58 (03/01 0749) Resp:  [0-25] 16 (03/01 0749) BP: (110-164)/(60-92) 135/81 mmHg (03/01 0749) SpO2:  [0 %-100 %] 98 % (03/01 0830) Weight:  [237 lb 7 oz (107.7 kg)] 237 lb 7 oz (107.7 kg) (03/01 0640)  Intake/Output from previous day: 02/28 0701 - 03/01 0700 In: 1032.5 [P.O.:600; I.V.:432.5] Out: 1400 [Urine:1400]   Physical Exam: General: Well developed, well nourished, in no acute distress. Head:  Normocephalic and atraumatic. Lungs: Clear to auscultation and percussion. Heart: Normal S1 and S2.  No murmur, rubs or gallops.  Abdomen: soft, non-tender, positive bowel sounds. Abdominal scar Extremities: No clubbing or cyanosis. No edema. Neurologic: Alert and oriented x 3.    Lab Results:  Recent Labs  05/15/15 1017 05/17/15 0556  WBC 12.8* 27.7*  HGB 16.9 16.8  PLT 281 273    Recent Labs  05/15/15 1017 05/17/15 0556  NA 138 139  K 4.2 3.7  CL 98 102  CO2 28 23  GLUCOSE 84 165*  BUN 13 20  CREATININE 0.96 1.15   Telemetry: No adverse arrhythmias Personally viewed.   EKG:  No new EKG Personally viewed.  Cardiac Studies:  Cardiac catheterization 05/16/15  Prox LAD to Mid LAD lesion, 90% stenosed. Post intervention, there remains 80% residual calcific stenosis.  Mid LAD lesion, 80% stenosed. Dist LAD lesion, 55% stenosed.  Prox Cx lesion, 100% stenosed. Prox Cx to Mid Cx lesion, 99% stenosed. Ost 2nd Mrg to 2nd Mrg lesion, 99% stenosed. -- Seen via retrograde filling from LAD/diagonal-OM collaterals  There is hyperdynamic left ventricular systolic function.  Elevated LVEDP   Severe 2 site lesion in the LAD. Very rigid lesions that would not yield to Cutting Balloon or noncompliant balloon angioplasty. These  lesions are likely best treated with rotational atherectomy after review with Dr. Peter Martinique.   Plan:  Monitor overnight following PTCA. We'll plan for return to the Cath Lab tomorrow for rotational atherectomy of the LAD with stenting of the 2 lesions. Advil for Dr. Peter Martinique.  Continue Brilinta and aspirin. Will restart IV heparin 8 hours after TR band removal  Add bisoprolol for beta blocker.   He will also need statin added   Expect they'll be able to be discharged following day after rotational atherectomy/PCI.  Meds: Scheduled Meds: . aspirin  81 mg Oral Daily  . atorvastatin  80 mg Oral q1800  . bisoprolol  5 mg Oral Daily  . budesonide  0.5 mg Nebulization BID  . cloNIDine  0.3 mg Oral BID  . gabapentin  800 mg Oral TID  . hydrochlorothiazide  25 mg Oral Daily  . insulin aspart  0-15 Units Subcutaneous TID WC  . loratadine  10 mg Oral Daily  . oxyCODONE-acetaminophen  1 tablet Oral 3 times per day   And  . oxyCODONE  5 mg Oral 3 times per day  . pantoprazole  40 mg Oral Daily  . sodium chloride flush  3 mL Intravenous Q12H  . sodium chloride flush  3 mL Intravenous Q12H  . ticagrelor  90 mg Oral BID   Continuous Infusions: . [START ON 05/18/2015] sodium chloride 3 mL/kg/hr (05/17/15 0615)   Followed by  . [START ON 05/18/2015] sodium chloride 1 mL/kg/hr (05/17/15 0715)  .  heparin 1,400 Units/hr (05/16/15 2215)   PRN Meds:.sodium chloride, sodium chloride, acetaminophen, albuterol, morphine injection, ondansetron (ZOFRAN) IV, sodium chloride flush, sodium chloride flush, traZODone  Assessment/Plan:  Principal Problem:   Abnormal nuclear stress test Active Problems:   Essential hypertension   Tobacco use   Type 2 diabetes mellitus with peripheral neuropathy (HCC)   Chest pain with high risk for cardiac etiology   Abnormal stress test   Unstable angina (HCC)  CAD with abnormal NUC stress - plan rotational arthrectomy with Dr. Martinique (LAD) - Brilinta  ASA - Bisoprolol - atorvastatin 80  Pain mgt - came to me originally for clearance/were stratification prior to pain management after he had an autonomic diagnostic analysis done at pain management clinic.  - This resulted in nuclear stress test because of periodic chest pressure which demonstrated abnormal findings.  Peripheral vascular disease - Questionable femoropopliteal bypass done in [place], abdominal scar noted  Essential hypertension - Currently controlled, medications reviewed  Tobacco use - Encourage cessation  Contrast hypersensitivity - He was premedicated prior to cardiac catheterization. - This is why WBC is elevated - No reaction to contrast noted.  - Will premed again. Discussed with Anderson Malta in cath lab.    Lyly Canizales, [place] 05/17/2015, 8:42 AM

## 2015-05-17 NOTE — Interval H&P Note (Signed)
History and Physical Interval Note:  05/17/2015 12:39 PM  Joshua [name]  has presented today for surgery, with the diagnosis of cad  The various methods of treatment have been discussed with the patient and family. After consideration of risks, benefits and other options for treatment, the patient has consented to  Procedure(s): Coronary Stent Intervention Rotoblater (N/A) as a surgical intervention .  The patient's history has been reviewed, patient examined, no change in status, stable for surgery.  I have reviewed the patient's chart and labs.  Questions were answered to the patient's satisfaction.   Cath Lab Visit (complete for each Cath Lab visit)  Clinical Evaluation Leading to the Procedure:   ACS: No.  Non-ACS:    Anginal Classification: CCS II  Anti-ischemic medical therapy: Minimal Therapy (1 class of medications)  Non-Invasive Test Results: Intermediate-risk stress test findings: cardiac mortality 1-3%/year  Prior CABG: No previous CABG        Joshua [name] [facility] 05/17/2015 12:40 PM

## 2015-05-17 NOTE — Care Management Note (Signed)
Case Management Note  Patient Details  Name: [name] MRN: [id number] Date of Birth: 03-12-1956  Subjective/Objective:    Patient lives at home with wife, pta indep.  Per benefit check - -Brinlinta-Pt copay will be $8.25- prior auth not required, patient uses mail order, MD to call in script to [phone] to [facility] [facility] for refills.  Patient has 30 day free trial savings card he will go to [facility] on [address].                 Action/Plan:   Expected Discharge Date:                  Expected Discharge Plan:  Home/Self Care  In-House Referral:     Discharge planning Services  CM Consult  Post Acute Care Choice:    Choice offered to:     DME Arranged:    DME Agency:     HH Arranged:    [place] Agency:     Status of Service:  Completed, signed off  Medicare Important Message Given:    Date Medicare IM Given:    Medicare IM give by:    Date Additional Medicare IM Given:    Additional Medicare Important Message give by:     If discussed at [place] of Stay Meetings, dates discussed:    Additional Comments:  Zenon Mayo, RN 05/17/2015, 9:42 AM

## 2015-05-17 NOTE — Progress Notes (Signed)
Premedicated for iodine allergy with solumedrol, pepcid, and benadryl.  Has been NPO except meds.  Heparin gtt turned off.  VSS.  Pt denies complaints, awaiting cath lab.

## 2015-05-18 ENCOUNTER — Encounter (HOSPITAL_COMMUNITY): Payer: Self-pay | Admitting: Cardiology

## 2015-05-18 DIAGNOSIS — E1142 Type 2 diabetes mellitus with diabetic polyneuropathy: Secondary | ICD-10-CM

## 2015-05-18 DIAGNOSIS — I200 Unstable angina: Secondary | ICD-10-CM

## 2015-05-18 DIAGNOSIS — I2510 Atherosclerotic heart disease of native coronary artery without angina pectoris: Secondary | ICD-10-CM

## 2015-05-18 HISTORY — DX: Atherosclerotic heart disease of native coronary artery without angina pectoris: I25.10

## 2015-05-18 LAB — BASIC METABOLIC PANEL
Anion gap: 12 (ref 5–15)
BUN: 22 mg/dL — AB (ref 6–20)
CALCIUM: 8.7 mg/dL — AB (ref 8.9–10.3)
CO2: 25 mmol/L (ref 22–32)
Chloride: 104 mmol/L (ref 101–111)
Creatinine, Ser: 1.23 mg/dL (ref 0.61–1.24)
GFR calc Af Amer: >60 mL/min (ref >60)
GLUCOSE: 196 mg/dL — AB (ref 65–99)
Potassium: 4.6 mmol/L (ref 3.5–5.1)
Sodium: 141 mmol/L (ref 135–145)

## 2015-05-18 LAB — GLUCOSE, CAPILLARY
GLUCOSE-CAPILLARY: 201 mg/dL — AB (ref 65–99)
Glucose-Capillary: 178 mg/dL — ABNORMAL HIGH (ref 65–99)

## 2015-05-18 LAB — CBC
HEMATOCRIT: 47.7 % (ref 39.0–52.0)
Hemoglobin: 15.9 g/dL (ref 13.0–17.0)
MCH: 28.8 pg (ref 26.0–34.0)
MCHC: 33.3 g/dL (ref 30.0–36.0)
MCV: 86.3 fL (ref 78.0–100.0)
Platelets: 262 10*3/uL (ref 150–400)
RBC: 5.53 MIL/uL (ref 4.22–5.81)
RDW: 16.3 % — AB (ref 11.5–15.5)
WBC: 27 10*3/uL — ABNORMAL HIGH (ref 4.0–10.5)

## 2015-05-18 MED ORDER — ATORVASTATIN CALCIUM 80 MG PO TABS: 80.0000 mg | ORAL_TABLET | Freq: Every day | ORAL | Status: DC

## 2015-05-18 MED ORDER — TICAGRELOR 90 MG PO TABS: 90.0000 mg | ORAL_TABLET | Freq: Two times a day (BID) | ORAL | Status: DC

## 2015-05-18 MED ORDER — NITROGLYCERIN 0.4 MG SL SUBL: 0.4000 mg | SUBLINGUAL_TABLET | SUBLINGUAL | Status: DC | PRN

## 2015-05-18 MED ORDER — BISOPROLOL FUMARATE 5 MG PO TABS: 5.0000 mg | ORAL_TABLET | Freq: Every day | ORAL | Status: DC

## 2015-05-18 MED ORDER — ASPIRIN 81 MG PO CHEW: 81.0000 mg | CHEWABLE_TABLET | Freq: Every day | ORAL | Status: DC

## 2015-05-18 MED FILL — Nitroglycerin IV Soln 200 MCG/ML in D5W: INTRAVENOUS | Qty: 250 | Status: AC

## 2015-05-18 NOTE — Discharge Summary (Signed)
Physician Discharge Summary       Patient ID: HIGINIO GROW MRN: [id number] DOB/AGE: May 08, 1955 60 y.o.  Admit date: 05/16/2015 Discharge date: 05/18/2015 Primary Cardiologist:Dr. Marlou Porch    Discharge Diagnoses:  Principal Problem:   Abnormal stress test Active Problems:   CAD in native artery, occluded Lcx and rotational atherectomy to LAD with DES 05/17/15   Essential hypertension   Peripheral vascular disease (HCC)   Tobacco use   Type 2 diabetes mellitus with peripheral neuropathy (HCC)   Abnormal nuclear stress test   Chest pain with high risk for cardiac etiology   Unstable angina [facility])   Discharged Condition: good  Procedures: 05/16/15 elective cath and attempted PCI on LAD by Dr. Ellyn Hack  05/17/15 Successful rotational atherectomy and stenting of tandem lesions in the mid LAD with DES.  Mid LAD lesion, 80% stenosed. Post intervention, there is a 0% residual stenosis.  Prox LAD to Mid LAD lesion, 80% stenosed. Post intervention, there is a 0% residual stenosis. The lesion was previously treated with angioplasty  By Dr. Martinique.  Hospital Course:   60 y.o. male history of continued tobacco abuse hypertension, COPD, peripheral vascular disease[place], GERD and arthritis. He was seen February 20th for evaluation of his heart prior to long-term pain management at the request of Dr. Garnette Gunner. In review of office notes, he went to a pain management clinic on 04/20/15 and had diagnostic studies done there and he was told that "heart will explode "and they did not prescribe any pain medication unless he had a letter from his primary care physician saying it is okay to proceed with pain medication. He underwent autonomic balance analysis. 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 Clinical protection of the heart is recommended for example beta  blockers, ace inhibitors, ARB or calcium channel blockers and additional cardiovascular workouts. Symptoms such as on the static hypotension, heat intolerance, vagal dystonia. This may be a sign of mental fatigue, chronic stress and possibly presence of Chronic health condition affecting. Subhepatic nervous system. Deep breathing showed normal parasympathetic response to stimulation. Valsalva showed hyperactive sympathetic response to stimulation and could be a cause for potential hypertension. Standing indicated parasympathetic surge. Heart rate upon standing is considered low.  Pt also had nuclear stress testing that was abnormal with inferolateral ischemia and normal EF.  Cardiac cath was discussed and pt agreed to have procedure.  Brought in electively for cardiac cath. He underwent cath after being treated for contrast allergy,  05/16/15 with prox LAD to mLAD 90% stenosed , pLCX 100% stenosed, prox to mLCX 99% stenosed, ost 2nd marg 99% stenosed.   Severe 2 site lesion in the LAD. Very rigid lesions that would not yield to Cutting Balloon or noncompliant balloon angioplasty. These lesions are likely best treated with rotational atherectomy.  Pt went back to lab 05/17/15 and had rotational atherectomy and stenting of tandem lesions in mLAD with DES  He will need DAPT for 1 year.  Day post procedure pt ambulated with cardiac rehab with no chest pain.  Did well.  Education on NTG, asa and Brilinta was given.  He plans to stop smoking cold [place].  He has been referred to CRP 2.   He was seen and evaluated by Dr. Marlou Porch and found stable for discharge.  Letter was written by Dr. Marlou Porch for pt cleared for pain clinic.    Pt discharged on Brilinta,  30 day free sent to pharmacy and 1 year of refills.and ASA Statin BB No Ace with normal EF. Rehab ordered. He will follow up in 1-2 weeks.    Consults: None  Significant Diagnostic Studies:  BMP Latest Ref Rng 05/18/2015 05/17/2015 05/15/2015  Glucose 65 -  99 mg/dL 196(H) 165(H) 84  BUN 6 - 20 mg/dL 22(H) 20 13  Creatinine 0.61 - 1.24 mg/dL 1.23 1.15 0.96  Sodium 135 - 145 mmol/L 141 139 138  Potassium 3.5 - 5.1 mmol/L 4.6 3.7 4.2  Chloride 101 - 111 mmol/L 104 102 98  CO2 22 - 32 mmol/L '25 23 28  '$ Calcium 8.9 - 10.3 mg/dL 8.7(L) 8.8(L) 9.7   CBC    Component Value Date/Time   WBC 27.0* 05/18/2015 0725   RBC 5.53 05/18/2015 0725   HGB 15.9 05/18/2015 0725   HCT 47.7 05/18/2015 0725   PLT 262 05/18/2015 0725   MCV 86.3 05/18/2015 0725   MCH 28.8 05/18/2015 0725   MCHC 33.3 05/18/2015 0725   RDW 16.3* 05/18/2015 0725   LYMPHSABS 3.1 05/15/2015 1017   MONOABS 1.2* 05/15/2015 1017   EOSABS 0.3 05/15/2015 1017   BASOSABS 0.1 05/15/2015 1017        Discharge Exam: Blood pressure 126/49, pulse 68, temperature 97.7 F (36.5 C), temperature source Oral, resp. rate 22, height '5\' 9"'$  (1.753 m), weight 239 lb 3.2 oz (108.5 kg), SpO2 95 %.   Disposition: 01-Home or Self Care      Discharge Instructions    Amb Referral to Cardiac Rehabilitation    Complete by:  As directed   Diagnosis:  PCI            Medication List    TAKE these medications        albuterol 108 (90 Base) MCG/ACT inhaler  Commonly known as:  PROVENTIL HFA;VENTOLIN HFA  Inhale 2 puffs into the lungs every 6 (six) hours as needed for wheezing or shortness of breath.     aspirin 81 MG chewable tablet  Chew 1 tablet (81 mg total) by mouth daily.     atorvastatin 80 MG tablet  Commonly known as:  LIPITOR  Take 1 tablet (80 mg total) by mouth daily at 6 PM.     beclomethasone 80 MCG/ACT inhaler  Commonly known as:  QVAR  Inhale 2 puffs into the lungs 2 (two) times daily.     bisoprolol 5 MG tablet  Commonly known as:  ZEBETA  Take 1 tablet (5 mg total) by mouth daily.     cetirizine 10 MG tablet  Commonly known as:  ZYRTEC ALLERGY  Take 1 tablet (10 mg total) by mouth daily.     cloNIDine 0.3 MG tablet  Commonly known as:  CATAPRES  Take 1 tablet  (0.3 mg total) by mouth 2 (two) times daily.     Fish Oil 1200 MG Caps  Take 1 capsule by mouth 3 (three) times daily.     gabapentin 800 MG tablet  Commonly known as:  NEURONTIN  Take 1 tablet (800 mg total) by mouth 3 (three) times daily.     hydrochlorothiazide 25 MG tablet  Commonly known as:  HYDRODIURIL  Take 1 tablet (25 mg total) by mouth daily.     nitroGLYCERIN 0.4 MG SL tablet  Commonly known as:  NITROSTAT  Place 1 tablet (0.4 mg total) under the tongue every 5 (five) minutes as needed for chest pain.     nitroGLYCERIN 0.4 MG SL tablet  Commonly known as:  NITROSTAT  Place 1 tablet (0.4 mg total) under the tongue every 5 (five) minutes as needed for chest pain.     omeprazole 20 MG capsule  Commonly known as:  PRILOSEC  Take 1 capsule (20 mg total) by mouth daily.     oxyCODONE-acetaminophen 10-325 MG tablet  Commonly known as:  PERCOCET  Take 1 tablet by mouth every 8 (eight) hours. Reported on 05/15/2015     ticagrelor 90 MG Tabs tablet  Commonly known as:  BRILINTA  Take 1 tablet (90 mg total) by mouth 2 (two) times daily.     traZODone 50 MG tablet  Commonly known as:  DESYREL  Take 1 tablet (50 mg total) by mouth at bedtime as needed for sleep.       Follow-up Information    Follow up with Ermalinda Barrios, PA-C On 05/29/2015.   Specialty:  Cardiology   Why:  at 8:15 am   Contact information:   [address] [place] [address] [phone]        Discharge Instructions: Take 1 NTG, under your tongue, while sitting.  If no relief of pain may repeat NTG, one tab every 5 minutes up to 3 tablets total over 15 minutes.  If no relief CALL 911.  If you have dizziness/lightheadness  while taking NTG, stop taking and call 911.        Call [facility] at [phone] if any bleeding, swelling or drainage at cath site.  May shower, no tub baths for 48 hours for groin sticks. No lifting over 5 pounds for 3 days.  No  Driving for 3 days  Heart Healthy diet  Do not stop the asprin or Brilinta, stopping could cause a heart attac Signed: GGYIRS,WNIOE R Nurse Practitioner-Certified [facility]: [facility] 05/18/2015, 5:42 PM  Time spent on discharge :> 30 minutes.    Personally seen and examined. Agree with above.   Cardiologist: Marlou Porch Subjective:  Overall doing well currently. No chest pain. No shortness of breath. Had atherectomy of LAD yesterday, successful stent placement.  Objective:  Vital Signs in the last 24 hours: Temp: [97.7 F (36.5 C)-98 F (36.7 C)] 97.7 F (36.5 C) (03/02 0834) Pulse Rate: [0-73] 68 (03/02 0834) Resp: [0-61] 22 (03/02 0834) BP: (99-144)/(45-80) 126/49 mmHg (03/02 0834) SpO2: [0 %-99 %] 95 % (03/02 0834) Weight: [239 lb 3.2 oz (108.5 kg)] 239 lb 3.2 oz (108.5 kg) (03/02 0446)  Intake/Output from previous day: 03/01 0701 - 03/02 0700 In: 1700 [P.O.:1260; I.V.:440] Out: 600 [Urine:600]   Physical Exam: General: Well developed, well nourished, in no acute distress. Head: Normocephalic and atraumatic. Lungs: Clear to auscultation and percussion. Heart: Normal S1 and S2. No murmur, rubs or gallops.  Abdomen: soft, non-tender, positive bowel sounds. Abdominal scar Extremities: No clubbing or cyanosis. No edema. Neurologic: Alert and oriented x 3.   Lab Results:  Recent Labs (last 2 labs)      Recent Labs  05/17/15 0556 05/18/15 0725  WBC 27.7* 27.0*  HGB 16.8 15.9  PLT 273 262      Recent Labs (last 2 labs)      Recent Labs  05/17/15 0556 05/18/15 0725  NA 139 141  K 3.7 4.6  CL 102 104  CO2 23 25  GLUCOSE 165* 196*  BUN 20 22*  CREATININE 1.15 1.23     Telemetry: No adverse arrhythmias Personally viewed.   EKG: No new EKG Personally  viewed.  Cardiac Studies: Cardiac catheterization 06-08-15 #1  Prox LAD to Mid LAD lesion, 90% stenosed. Post intervention, there remains 80%  residual calcific stenosis.  Mid LAD lesion, 80% stenosed. Dist LAD lesion, 55% stenosed.  Prox Cx lesion, 100% stenosed. Prox Cx to Mid Cx lesion, 99% stenosed. Ost 2nd Mrg to 2nd Mrg lesion, 99% stenosed. -- Seen via retrograde filling from LAD/diagonal-OM collaterals  There is hyperdynamic left ventricular systolic function.  Elevated LVEDP   Severe 2 site lesion in the LAD. Very rigid lesions that would not yield to Cutting Balloon or noncompliant balloon angioplasty. These lesions are likely best treated with rotational atherectomy after review with Dr. Peter Martinique.   Plan:  Monitor overnight following PTCA. We'll plan for return to the Cath Lab tomorrow for rotational atherectomy of the LAD with stenting of the 2 lesions. Advil for Dr. Peter Martinique.  Continue Brilinta and aspirin. Will restart IV heparin 8 hours after TR band removal  Add bisoprolol for beta blocker.   He will also need statin added   Expect they'll be able to be discharged following day after rotational atherectomy/PCI.  Cardiac catheterization #2 05/17/15-Dr. Martinique  Mid LAD lesion, 80% stenosed. Post intervention, there is a 0% residual stenosis.  Prox LAD to Mid LAD lesion, 80% stenosed. Post intervention, there is a 0% residual stenosis. The lesion was previously treated with angioplasty.  Successful rotational atherectomy and stenting of tandem lesions in the mid LAD with DES.  Plan: DAPT for one year. Anticipate DC in am.   Meds: Scheduled Meds: . aspirin 81 mg Oral Daily  . atorvastatin 80 mg Oral q1800  . bisoprolol 5 mg Oral Daily  . budesonide 0.5 mg Nebulization BID  . cloNIDine 0.3 mg Oral BID  . gabapentin 800 mg Oral TID  . hydrochlorothiazide 25 mg Oral Daily  . insulin aspart 0-15 Units Subcutaneous TID WC  . loratadine 10 mg Oral Daily  . oxyCODONE-acetaminophen 1 tablet Oral 3 times per day   And  . oxyCODONE  5 mg Oral 3 times per day  . pantoprazole 40 mg Oral Daily  . sodium chloride flush 3 mL Intravenous Q12H  . ticagrelor 90 mg Oral BID   Continuous Infusions:   PRN Meds:.sodium chloride, acetaminophen, albuterol, morphine injection, ondansetron (ZOFRAN) IV, sodium chloride flush, traZODone  Assessment/Plan:  Principal Problem:  Abnormal nuclear stress test Active Problems:  Essential hypertension  Tobacco use  Type 2 diabetes mellitus with peripheral neuropathy (HCC)  Chest pain with high risk for cardiac etiology  Abnormal stress test  Unstable angina (HCC)  CAD with abnormal NUC stress - Successful rotational arthrectomy with Dr. Martinique (LAD) - Brilinta ASA - Bisoprolol - atorvastatin 80  Pain mgt - came to me originally for clearance/were stratification prior to pain management after he had an autonomic diagnostic analysis done at pain management clinic.  - This resulted in nuclear stress test because of periodic chest pressure which demonstrated abnormal findings.  - I'm comfortable now with him proceeding with pain management clinic treatment. I will write him a letter in the letter section of epic stating that he may be prescribed pain medication by pain management clinic. He needed our clearance.  Peripheral vascular disease - femoropopliteal bypass done in [place], abdominal scar noted  Essential hypertension - Currently controlled, medications reviewed  Tobacco use - Encourage cessation  Contrast hypersensitivity - He was premedicated prior to cardiac catheterization. - This is why WBC is elevated - No reaction to contrast  noted.   Cardiac rehabilitation has seen him. Ready for discharge. Follow-up in 1-2 weeks    Logun Colavito

## 2015-05-18 NOTE — Discharge Instructions (Signed)
Take 1 NTG, under your tongue, while sitting.  If no relief of pain may repeat NTG, one tab every 5 minutes up to 3 tablets total over 15 minutes.  If no relief CALL 911.  If you have dizziness/lightheadness  while taking NTG, stop taking and call 911.        Call [facility] at [phone] if any bleeding, swelling or drainage at cath site.  May shower, no tub baths for 48 hours for groin sticks. No lifting over 5 pounds for 3 days.  No Driving for 3 days  Heart Healthy diet  Do not stop the asprin or Brilinta, stopping could cause a heart attack.

## 2015-05-18 NOTE — Progress Notes (Signed)
Cardiologist: Marlou Porch Subjective:  Overall doing well currently. No chest pain. No shortness of breath. Had atherectomy of LAD yesterday, successful stent placement.  Objective:  Vital Signs in the last 24 hours: Temp:  [97.7 F (36.5 C)-98 F (36.7 C)] 97.7 F (36.5 C) (03/02 0834) Pulse Rate:  [0-73] 68 (03/02 0834) Resp:  [0-61] 22 (03/02 0834) BP: (99-144)/(45-80) 126/49 mmHg (03/02 0834) SpO2:  [0 %-99 %] 95 % (03/02 0834) Weight:  [239 lb 3.2 oz (108.5 kg)] 239 lb 3.2 oz (108.5 kg) (03/02 0446)  Intake/Output from previous day: 03/01 0701 - 03/02 0700 In: 1700 [P.O.:1260; I.V.:440] Out: 600 [Urine:600]   Physical Exam: General: Well developed, well nourished, in no acute distress. Head:  Normocephalic and atraumatic. Lungs: Clear to auscultation and percussion. Heart: Normal S1 and S2.  No murmur, rubs or gallops.  Abdomen: soft, non-tender, positive bowel sounds. Abdominal scar Extremities: No clubbing or cyanosis. No edema. Neurologic: Alert and oriented x 3.    Lab Results:  Recent Labs  05/17/15 0556 05/18/15 0725  WBC 27.7* 27.0*  HGB 16.8 15.9  PLT 273 262    Recent Labs  05/17/15 0556 05/18/15 0725  NA 139 141  K 3.7 4.6  CL 102 104  CO2 23 25  GLUCOSE 165* 196*  BUN 20 22*  CREATININE 1.15 1.23   Telemetry: No adverse arrhythmias Personally viewed.   EKG:  No new EKG Personally viewed.  Cardiac Studies:  Cardiac catheterization 05/16/15 #1  Prox LAD to Mid LAD lesion, 90% stenosed. Post intervention, there remains 80% residual calcific stenosis.  Mid LAD lesion, 80% stenosed. Dist LAD lesion, 55% stenosed.  Prox Cx lesion, 100% stenosed. Prox Cx to Mid Cx lesion, 99% stenosed. Ost 2nd Mrg to 2nd Mrg lesion, 99% stenosed. -- Seen via retrograde filling from LAD/diagonal-OM collaterals  There is hyperdynamic left ventricular systolic function.  Elevated LVEDP   Severe 2 site lesion in the LAD. Very rigid lesions that would not  yield to Cutting Balloon or noncompliant balloon angioplasty. These lesions are likely best treated with rotational atherectomy after review with Dr. Peter Martinique.   Plan:  Monitor overnight following PTCA. We'll plan for return to the Cath Lab tomorrow for rotational atherectomy of the LAD with stenting of the 2 lesions. Advil for Dr. Peter Martinique.  Continue Brilinta and aspirin. Will restart IV heparin 8 hours after TR band removal  Add bisoprolol for beta blocker.   He will also need statin added   Expect they'll be able to be discharged following day after rotational atherectomy/PCI.  Cardiac catheterization #2 05/17/15-Dr. Martinique  Mid LAD lesion, 80% stenosed. Post intervention, there is a 0% residual stenosis.  Prox LAD to Mid LAD lesion, 80% stenosed. Post intervention, there is a 0% residual stenosis. The lesion was previously treated with angioplasty.  Successful rotational atherectomy and stenting of tandem lesions in the mid LAD with DES.  Plan: DAPT for one year. Anticipate DC in am.   Meds: Scheduled Meds: . aspirin  81 mg Oral Daily  . atorvastatin  80 mg Oral q1800  . bisoprolol  5 mg Oral Daily  . budesonide  0.5 mg Nebulization BID  . cloNIDine  0.3 mg Oral BID  . gabapentin  800 mg Oral TID  . hydrochlorothiazide  25 mg Oral Daily  . insulin aspart  0-15 Units Subcutaneous TID WC  . loratadine  10 mg Oral Daily  . oxyCODONE-acetaminophen  1 tablet Oral 3 times per day  And  . oxyCODONE  5 mg Oral 3 times per day  . pantoprazole  40 mg Oral Daily  . sodium chloride flush  3 mL Intravenous Q12H  . ticagrelor  90 mg Oral BID   Continuous Infusions:   PRN Meds:.sodium chloride, acetaminophen, albuterol, morphine injection, ondansetron (ZOFRAN) IV, sodium chloride flush, traZODone  Assessment/Plan:  Principal Problem:   Abnormal nuclear stress test Active Problems:   Essential hypertension   Tobacco use   Type 2 diabetes mellitus with peripheral  neuropathy (HCC)   Chest pain with high risk for cardiac etiology   Abnormal stress test   Unstable angina (HCC)  CAD with abnormal NUC stress - Successful rotational arthrectomy with Dr. Martinique (LAD) - Brilinta ASA - Bisoprolol - atorvastatin 80  Pain mgt - came to me originally for clearance/were stratification prior to pain management after he had an autonomic diagnostic analysis done at pain management clinic.  - This resulted in nuclear stress test because of periodic chest pressure which demonstrated abnormal findings.  - I'm comfortable now with him proceeding with pain management clinic treatment. I will write him a letter in the letter section of epic stating that he may be prescribed pain medication by pain management clinic. He needed our clearance.  Peripheral vascular disease -  femoropopliteal bypass done in [place], abdominal scar noted  Essential hypertension - Currently controlled, medications reviewed  Tobacco use - Encourage cessation  Contrast hypersensitivity - He was premedicated prior to cardiac catheterization. - This is why WBC is elevated - No reaction to contrast noted.   Cardiac rehabilitation has seen him. Ready for discharge. Follow-up in 1-2 weeks    SKAINS, MARK 05/18/2015, 9:41 AM

## 2015-05-18 NOTE — Progress Notes (Signed)
CARDIAC REHAB PHASE I   PRE:  Rate/Rhythm: 65 SR  BP:  Supine:   Sitting: 126/49  Standing:    SaO2:   MODE:  Ambulation: 800 ft   POST:  Rate/Rhythm: 89 SR  BP:  Supine:   Sitting: 158/69  Standing:    SaO2:  0900-0952 Pt walked 800 ft with steady gait. No CP. Tolerated well. Education completed with pt and wife who voiced understanding. Stressed importance of brilinta with stent . Pt stated he has brilinta card. Reviewed NTG use, ex ed and carb counting. Discussed CRP 2 and will refer to [place]. Discusses smoking cessation and gave handout. Pt did not want fake cigarette. Plans to quit cold [place]. Wife plans to quit also.   Graylon Good, RN BSN  05/18/2015 9:46 AM

## 2015-05-19 ENCOUNTER — Telehealth: Payer: Self-pay | Admitting: Internal Medicine

## 2015-05-19 ENCOUNTER — Other Ambulatory Visit: Payer: Self-pay

## 2015-05-19 MED ORDER — GLUCOSE BLOOD VI STRP: 1.0000 | ORAL_STRIP | Freq: Three times a day (TID) | Status: DC

## 2015-05-19 MED ORDER — ONETOUCH VERIO W/DEVICE KIT: 1.0000 | PACK | Freq: Once | Status: DC

## 2015-05-19 MED ORDER — ONETOUCH LANCETS MISC: 1.0000 | Freq: Three times a day (TID) | Status: DC

## 2015-05-19 NOTE — Telephone Encounter (Signed)
Brandy @ [facility] called she stated In network coverage meters and test strips Limited to following products  One touch ultra #2 system One touch ultra mini  One touch verio One touch verio sync One touch verio iq One touch verio flex  System kit accu check nano smart view accu check aviva plus

## 2015-05-19 NOTE — Telephone Encounter (Signed)
[facility] and was advised by cardiologist to get diabetic meter to ck BS at home. Pt BS was in 250's when in hospital. [place]. Pt is going to ck with ins to see what diabetic supplies are approved and pt will cb today with info.

## 2015-05-19 NOTE — Telephone Encounter (Signed)
Rx sent through [employer] One touch verio per formulary online

## 2015-05-19 NOTE — Telephone Encounter (Signed)
Please advise on how often you would like pt to test BG---meter strips etc already ordered per [facility] 2017 formulary

## 2015-05-19 NOTE — Telephone Encounter (Signed)
To Mel

## 2015-05-19 NOTE — Telephone Encounter (Signed)
TID

## 2015-05-22 ENCOUNTER — Inpatient Hospital Stay (HOSPITAL_COMMUNITY): Admission: RE | Admit: 2015-05-22 | Payer: Medicare Other | Source: Ambulatory Visit

## 2015-05-22 ENCOUNTER — Ambulatory Visit (HOSPITAL_COMMUNITY)
Admission: RE | Admit: 2015-05-22 | Discharge: 2015-05-22 | Disposition: A | Discharge status: Home / Self Care | Payer: Medicare Other | Source: Ambulatory Visit | Attending: Physician Assistant | Admitting: Physician Assistant

## 2015-05-22 DIAGNOSIS — I771 Stricture of artery: Secondary | ICD-10-CM | POA: Insufficient documentation

## 2015-05-22 DIAGNOSIS — I739 Peripheral vascular disease, unspecified: Secondary | ICD-10-CM

## 2015-05-22 DIAGNOSIS — R938 Abnormal findings on diagnostic imaging of other specified body structures: Secondary | ICD-10-CM | POA: Diagnosis not present

## 2015-05-22 DIAGNOSIS — I10 Essential (primary) hypertension: Secondary | ICD-10-CM | POA: Insufficient documentation

## 2015-05-22 DIAGNOSIS — E785 Hyperlipidemia, unspecified: Secondary | ICD-10-CM | POA: Insufficient documentation

## 2015-05-23 ENCOUNTER — Other Ambulatory Visit: Payer: Self-pay | Admitting: Physician Assistant

## 2015-05-23 DIAGNOSIS — M545 Low back pain: Secondary | ICD-10-CM | POA: Diagnosis not present

## 2015-05-23 DIAGNOSIS — M79671 Pain in right foot: Secondary | ICD-10-CM | POA: Diagnosis not present

## 2015-05-23 DIAGNOSIS — I739 Peripheral vascular disease, unspecified: Secondary | ICD-10-CM

## 2015-05-23 DIAGNOSIS — G894 Chronic pain syndrome: Secondary | ICD-10-CM | POA: Diagnosis not present

## 2015-05-23 DIAGNOSIS — Z79891 Long term (current) use of opiate analgesic: Secondary | ICD-10-CM | POA: Diagnosis not present

## 2015-05-23 DIAGNOSIS — G629 Polyneuropathy, unspecified: Secondary | ICD-10-CM | POA: Diagnosis not present

## 2015-05-23 DIAGNOSIS — M79672 Pain in left foot: Secondary | ICD-10-CM | POA: Diagnosis not present

## 2015-05-24 ENCOUNTER — Other Ambulatory Visit: Payer: Self-pay

## 2015-05-24 NOTE — Telephone Encounter (Signed)
Pt stated that he has tried Flonase in the past and he cannot tolerate it--verbal okay from [facility] to send in [place] expressed understanding---pt also states the home visit nurse with [facility] recommended pt f/u with PCP---f/u OV scheduled

## 2015-05-24 NOTE — Addendum Note (Signed)
Addended by: Lurlean Nanny on: 05/24/2015 04:40 PM   Modules accepted: Orders, Medications

## 2015-05-24 NOTE — Telephone Encounter (Signed)
Have him do Flonase in am, and Zyrtec and night. If that is not effective after 1 week, we can try Xyzal.

## 2015-05-24 NOTE — Telephone Encounter (Signed)
Pt left v/m; pt is taking zyrtec but is not effective with pollen allergies. Pt request stronger med sent to [place]. Pt request cb when done.

## 2015-05-24 NOTE — Telephone Encounter (Signed)
Please advise if correct Rx has been ordered and send to pharmacy

## 2015-05-25 ENCOUNTER — Ambulatory Visit (HOSPITAL_COMMUNITY)
Admission: RE | Admit: 2015-05-25 | Discharge: 2015-05-25 | Disposition: A | Discharge status: Home / Self Care | Payer: Medicare Other | Source: Ambulatory Visit | Attending: Cardiology | Admitting: Cardiology

## 2015-05-25 DIAGNOSIS — I739 Peripheral vascular disease, unspecified: Secondary | ICD-10-CM | POA: Diagnosis not present

## 2015-05-25 DIAGNOSIS — E785 Hyperlipidemia, unspecified: Secondary | ICD-10-CM | POA: Insufficient documentation

## 2015-05-25 DIAGNOSIS — I10 Essential (primary) hypertension: Secondary | ICD-10-CM | POA: Diagnosis not present

## 2015-05-25 DIAGNOSIS — I700 Atherosclerosis of aorta: Secondary | ICD-10-CM | POA: Diagnosis not present

## 2015-05-25 MED ORDER — LEVOCETIRIZINE DIHYDROCHLORIDE 5 MG PO TABS: 5.0000 mg | ORAL_TABLET | Freq: Every evening | ORAL | Status: DC

## 2015-05-25 NOTE — Telephone Encounter (Signed)
Correct, will send electronically

## 2015-05-29 ENCOUNTER — Ambulatory Visit (INDEPENDENT_AMBULATORY_CARE_PROVIDER_SITE_OTHER): Payer: Medicare Other | Admitting: Physician Assistant

## 2015-05-29 ENCOUNTER — Encounter: Payer: Self-pay | Admitting: Physician Assistant

## 2015-05-29 VITALS — BP 120/62 | HR 52 | Ht 69.0 in | Wt 234.0 lb

## 2015-05-29 DIAGNOSIS — I10 Essential (primary) hypertension: Secondary | ICD-10-CM

## 2015-05-29 DIAGNOSIS — Z720 Tobacco use: Secondary | ICD-10-CM

## 2015-05-29 DIAGNOSIS — I739 Peripheral vascular disease, unspecified: Secondary | ICD-10-CM

## 2015-05-29 DIAGNOSIS — I2510 Atherosclerotic heart disease of native coronary artery without angina pectoris: Secondary | ICD-10-CM

## 2015-05-29 DIAGNOSIS — R001 Bradycardia, unspecified: Secondary | ICD-10-CM

## 2015-05-29 MED ORDER — BISOPROLOL FUMARATE 5 MG PO TABS: 2.5000 mg | ORAL_TABLET | Freq: Every day | ORAL | Status: DC

## 2015-05-29 NOTE — Assessment & Plan Note (Signed)
Patient's heart rate is 48 today on isoproterenol and clonidine. Will decrease bisoprolol to 2.5 mg daily.

## 2015-05-29 NOTE — Patient Instructions (Signed)
Medication Instructions:   START TAKING 1/2 TABLET  BISOPROLOL ONCE A DAY    If you need a refill on your cardiac medications before your next appointment, please call your pharmacy.  Labwork:  RETURN FOR FASTING LIPIDS AND LFT  IN 6 WEEKS  .[name] WEEK OF July 10 2015   Testing/Procedures:  NONE ORDER TODAY    Follow-Up:  WITH DR Marlou Porch IN 2 TO 3 MONTHS                        WITH DR Fletcher [name] FOR PVD  NEXT AVAILABLE APPOINTMENT   Any Other Special Instructions Will Be Listed Below (If Applicable).  YOU HAVE BEEN RECCOMMENDED TO STOP SMOKING

## 2015-05-29 NOTE — Assessment & Plan Note (Signed)
Patient is trying to stop smoking. He is down to 4 cigarettes weekly.

## 2015-05-29 NOTE — Assessment & Plan Note (Addendum)
Patient underwent rotational atherectomy and stenting of tandem lesions in the mid LAD with DES. He is residual total circumflex disease. She has normal LV function. He is bradycardic today. Bisoprolol was started in the hospital and he is also on clonidine for hypertension. We'll decrease bisoprolol to 2.5 mg daily. Patient is having some shortness of breath or feeling like he has to gasp for breath occasionally that may be related to Brilinta. We'll continue to monitor.

## 2015-05-29 NOTE — Progress Notes (Signed)
Cardiology Office Note   Date:  05/29/2015   ID:  Joshua [name], DOB August 29, 1955, MRN [id number]  PCP:  Webb Silversmith, NP  Cardiologist: Dr. Marlou Porch  Chief Complaint: Post hospital follow-up    History of Present Illness: Joshua [name] is a 60 y.o. male who presents for post hospital follow-up. He came to Dr. Marlou Porch for clearance prior to pain management after he had an autonomic diagnostic analysis done at the pain management clinic. He was also having periodic chest pressure and underwent nuclear stress test demonstrating abnormal findings. He underwent rotational atherectomy and stenting of tandem lesions in the mid LAD with DES. He will need the APTT for 1 year. He had a total circumflex, mid circumflex 99% stenosis, ostial OM 299% stenosed. He also has diabetes mellitus with neuropathy, tobacco abuse, peripheral vascular disease status post femoropopliteal bypass done in [place], hypertension and contrast hypersensitivity. He was given clearance to proceed with pain management.  Patient is feeling better, walking more without chest tightness. He is complaining of some shortness of breath at times especially when he bends over. He says he feels like he has to take a deep breath and may be related to [place]. He also had Doppler studies performed last week. Showing 50-74% left SFA stenosis and occluded bilateral peroneal arteries. Patent aortobifem bypass grafts. His main complaint is foot pain with walking. And he says his toes are blue. He has decreased cigarettes to 4 week.       Past Medical History  Diagnosis Date  . Hypertension   . COPD (chronic obstructive pulmonary disease) ([place])   . Spinal disease   . Seasonal allergies   . Positive TB test   . Phlebitis   . Shortness of breath   . GERD (gastroesophageal reflux disease)   . Hyperlipidemia   . H/O blood clots     "had them in my back; put filter in before one of my neck ORs"  . Chronic bronchitis ([place])    . Arthritis     "back" (05/16/2015)  . Chronic back pain     "all my back"  . History of gout   . CAD in native artery, occluded Lcx and rotational atherectomy to LAD with DES 05/17/15 05/18/2015    Past Surgical History  Procedure Laterality Date  . Femoral bypass  ~ 2010  . Anterior cervical decomp/discectomy fusion  ~ 2001-2009 X 3  . Back surgery    . Anterior cervical decomp/discectomy fusion N/A 10/06/2012    Procedure: ANTERIOR CERVICAL DECOMPRESSION/DISCECTOMY FUSION 1 LEVEL;  Surgeon: [name], MD;  Location: [facility];  Service: Neurosurgery;  Laterality: N/A;  C7T1 anterior cervical decompression with fusion plating and bonegraft   . Cardiac catheterization  05/16/2015  . Excisional hemorrhoidectomy    . Posterior laminectomy / decompression lumbar spine  2006    "bone spurs"  . Elbow surgery Left 2016    "had bone pieces removed"  . Ivc filter placement ([facility] hx)  2010    "had blood clots in my back"  . Cardiac catheterization N/A 05/16/2015    Procedure: Left Heart Cath and Coronary Angiography;  Surgeon: [name], MD;  Location: [facility];  Service: Cardiovascular;  Laterality: N/A;  . Cardiac catheterization N/A 05/16/2015    Procedure: Coronary Balloon Angioplasty;  Surgeon: [name], MD;  Location: [facility];  Service: Cardiovascular;  Laterality: N/A;  . Cardiac catheterization N/A 05/17/2015    Procedure:  Coronary Stent Intervention Rotoblater;  Surgeon: [name], MD;  Location: [facility];  Service: Cardiovascular;  Laterality: N/A;     Current Outpatient Prescriptions  Medication Sig Dispense Refill  . albuterol (PROVENTIL HFA;VENTOLIN HFA) 108 (90 Base) MCG/ACT inhaler Inhale 2 puffs into the lungs every 6 (six) hours as needed for wheezing or shortness of breath.    [name] aspirin 81 MG chewable tablet Chew 1 tablet (81 mg total) by mouth daily.    [name] atorvastatin (LIPITOR) 80 MG tablet Take 1 tablet (80 mg total) by  mouth daily at 6 PM. 30 tablet 1  . beclomethasone (QVAR) 80 MCG/ACT inhaler Inhale 2 puffs into the lungs 2 (two) times daily.    . bisoprolol (ZEBETA) 5 MG tablet Take 1 tablet (5 mg total) by mouth daily. 30 tablet 1  . Blood Glucose Monitoring Suppl (ONETOUCH VERIO) w/Device KIT 1 Device by Does not apply route once. 1 kit 0  . cloNIDine (CATAPRES) 0.3 MG tablet Take 1 tablet (0.3 mg total) by mouth 2 (two) times daily. 180 tablet 1  . gabapentin (NEURONTIN) 800 MG tablet Take 1 tablet (800 mg total) by mouth 3 (three) times daily. 270 tablet 1  . glucose blood (ONETOUCH VERIO) test strip 1 each by Other route 3 (three) times daily. Use as instructed 100 each 11  . hydrochlorothiazide (HYDRODIURIL) 25 MG tablet Take 1 tablet (25 mg total) by mouth daily. 90 tablet 1  . levocetirizine (XYZAL) 5 MG tablet Take 1 tablet (5 mg total) by mouth every evening. 30 tablet 2  . nitroGLYCERIN (NITROSTAT) 0.4 MG SL tablet Place 1 tablet (0.4 mg total) under the tongue every 5 (five) minutes as needed for chest pain. 25 tablet 4  . Omega-3 Fatty Acids (FISH OIL) 1200 MG CAPS Take 1 capsule by mouth 3 (three) times daily.    [name] omeprazole (PRILOSEC) 20 MG capsule Take 1 capsule (20 mg total) by mouth daily. 90 capsule 1  . ONE TOUCH LANCETS MISC 1 each by Does not apply route 3 (three) times daily. 200 each 5  . oxyCODONE-acetaminophen (PERCOCET) 7.5-325 MG tablet Take 1 tablet by mouth 3 (three) times daily as needed.    . ticagrelor (BRILINTA) 90 MG TABS tablet Take 1 tablet (90 mg total) by mouth 2 (two) times daily. 60 tablet 1  . traZODone (DESYREL) 50 MG tablet Take 1 tablet (50 mg total) by mouth at bedtime as needed for sleep. 90 tablet 1  . [DISCONTINUED] lisinopril (PRINIVIL,ZESTRIL) 40 MG tablet TAKE 1 TABLET (40 MG TOTAL) BY MOUTH DAILY. 90 tablet 1   Current Facility-Administered Medications  Medication Dose Route Frequency Provider Last Rate Last Dose  . levalbuterol (XOPENEX) nebulizer  solution 0.63 mg  0.63 mg Nebulization Once Brett Canales, PA-C        Allergies:   Lisinopril; Contrast media; and Ibuprofen    Social History:  The patient  reports that he has been smoking Cigarettes.  He has a 11.75 pack-year smoking history. He has never used smokeless tobacco. He reports that he drinks alcohol. He reports that he does not use illicit drugs.   Family History:  The patient's family history includes Healthy in his daughter; Heart attack in his father; Hypertension in his father and mother; Stomach cancer in his mother; Stroke in his father. There is no history of Diabetes or Early death.    ROS:  Please see the history of present illness.   Otherwise, review of  systems are positive for chronic leg pain chronic dyspnea on exertion and snoring wheezing chronic back pain and headaches.   All other systems are reviewed and negative.    PHYSICAL EXAM: VS:  BP 120/62 mmHg  Pulse 52  Ht '5\' 9"'  (1.753 m)  Wt 234 lb (106.142 kg)  BMI 34.54 kg/m2 , BMI Body mass index is 34.54 kg/(m^2). GEN:  obese, in no acute distress Neck: no JVD, HJR, carotid bruits, or masses Cardiac: RRR; heart sounds no murmurs,gallop, rubs, thrill or heave,  Respiratory:  Decreased breath sounds with diffuse wheezing GI: soft, nontender, nondistended, + BS MS: no deformity or atrophy Extremities: Right arm and cath site without hematoma or hemorrhage, good radial and brachial pulses his lower extremities with cyanosis and decreased pulses bilaterally and no edema  Skin: warm and dry, no rash Neuro:  Strength and sensation are intact    EKG:  EKG is ordered today. The ekg ordered today demonstrates sinus bradycardia at 48 bpm nonspecific   Recent Labs: 04/21/2015: ALT 22 05/18/2015: BUN 22*; Creatinine, Ser 1.23; Hemoglobin 15.9; Platelets 262; Potassium 4.6; Sodium 141    Lipid Panel    Component Value Date/Time   CHOL 175 04/21/2015 1352   TRIG 328.0* 04/21/2015 1352   HDL 31.30* 04/21/2015  1352   CHOLHDL 6 04/21/2015 1352   VLDL 65.6* 04/21/2015 1352   LDLDIRECT 107.0 04/21/2015 1352      Wt Readings from Last 3 Encounters:  05/29/15 234 lb (106.142 kg)  05/18/15 239 lb 3.2 oz (108.5 kg)  05/15/15 238 lb 6.4 oz (108.138 kg)      Other studies Reviewed: Additional studies/ records that were reviewed today include and review of the records demonstrates:   Procedures: 05/16/15 elective cath and attempted PCI on LAD by Dr. Ellyn Hack  05/17/15 Successful rotational atherectomy and stenting of tandem lesions in the mid LAD with DES.  Mid LAD lesion, 80% stenosed. Post intervention, there is a 0% residual stenosis.  Prox LAD to Mid LAD lesion, 80% stenosed. Post intervention, there is a 0% residual stenosis. The lesion was previously treated with angioplasty  By Dr. Martinique.   Left Heart    Left Ventricle The left ventricular size is normal. There is hyperdynamic left ventricular systolic function. The left ventricular ejection fraction is greater tha 65% by visual estimate. There are no wall motion abnormalities in the left ventricle     Arterial ultrasound of the lower extremities 05/22/15 Examination Data 50-74% left SFA stenosis.  Occluded bilateral peroneal arteries. Two vessel run-off, bilaterally  Aortoiliac ultrasound 05/25/15 Technically challenging study. Patent aorta-bifemoral bypass graft. Normal caliber abdominal aorta, common and external iliac arteries, without focal stenosis, or dilatation. Patent IVC. See Artertial Doppler and Duplex performed on 05/22/15.  ASSESSMENT AND PLAN:   Sumner Boast, PA-C  05/29/2015 8:24 AM    [facility] [place], [place], [place]  [address] Phone: [phone]; Fax: [phone]

## 2015-05-29 NOTE — Assessment & Plan Note (Signed)
Blood pressure controlled. 

## 2015-05-29 NOTE — Assessment & Plan Note (Signed)
Patient underwent vascular studies last week and was found to have 50-74% left SFA stenosis and occluded bilateral peroneal arteries. He has patent aortobifem bypass grafts. Will refer to Dr. Fletcher [name] for further management.

## 2015-05-30 ENCOUNTER — Ambulatory Visit: Payer: Medicare Other | Admitting: Internal Medicine

## 2015-06-17 ENCOUNTER — Other Ambulatory Visit: Payer: Self-pay | Admitting: Cardiology

## 2015-06-20 ENCOUNTER — Ambulatory Visit (INDEPENDENT_AMBULATORY_CARE_PROVIDER_SITE_OTHER): Payer: Medicare Other | Admitting: Cardiovascular Disease

## 2015-06-20 ENCOUNTER — Encounter: Payer: Self-pay | Admitting: Cardiovascular Disease

## 2015-06-20 ENCOUNTER — Other Ambulatory Visit: Payer: Self-pay | Admitting: *Deleted

## 2015-06-20 VITALS — BP 100/70 | HR 64 | Ht 69.0 in | Wt 232.2 lb

## 2015-06-20 DIAGNOSIS — I739 Peripheral vascular disease, unspecified: Secondary | ICD-10-CM

## 2015-06-20 MED ORDER — BISOPROLOL FUMARATE 5 MG PO TABS: 2.5000 mg | ORAL_TABLET | Freq: Every day | ORAL | Status: DC

## 2015-06-20 MED ORDER — ATORVASTATIN CALCIUM 80 MG PO TABS: 80.0000 mg | ORAL_TABLET | Freq: Every day | ORAL | Status: DC

## 2015-06-20 NOTE — Patient Instructions (Signed)
Medication Instructions:  Your physician recommends that you continue on your current medications as directed. Please refer to the Current Medication list given to you today.  Labwork: No new orders.  Testing/Procedures: No new orders.   Follow-Up: Your physician wants you to follow-up in: 6 MONTHS with Dr Fletcher [name]. You will receive a reminder letter in the mail two months in advance. If you don't receive a letter, please call our office to schedule the follow-up appointment.  Any Other Special Instructions Will Be Listed Below (If Applicable).  Smoking Cessation, Tips for Success If you are ready to quit smoking, congratulations! You have chosen to help yourself be healthier. Cigarettes bring nicotine, tar, carbon monoxide, and other irritants into your body. Your lungs, heart, and blood vessels will be able to work better without these poisons. There are many different ways to quit smoking. Nicotine gum, nicotine patches, a nicotine inhaler, or nicotine nasal spray can help with physical craving. Hypnosis, support groups, and medicines help break the habit of smoking. WHAT THINGS CAN I DO TO MAKE QUITTING EASIER?  Here are some tips to help you quit for good:  Pick a date when you will quit smoking completely. Tell all of your friends and family about your plan to quit on that date.  Do not try to slowly cut down on the number of cigarettes you are smoking. Pick a quit date and quit smoking completely starting on that day.  Throw away all cigarettes.   Clean and remove all ashtrays from your home, work, and car.  On a card, write down your reasons for quitting. Carry the card with you and read it when you get the urge to smoke.  Cleanse your body of nicotine. Drink enough water and fluids to keep your urine clear or pale yellow. Do this after quitting to flush the nicotine from your body.  Learn to predict your moods. Do not let a bad situation be your excuse to have a cigarette. Some  situations in your life might tempt you into wanting a cigarette.  Never have "just one" cigarette. It leads to wanting another and another. Remind yourself of your decision to quit.  Change habits associated with smoking. If you smoked while driving or when feeling stressed, try other activities to replace smoking. Stand up when drinking your coffee. Brush your teeth after eating. Sit in a different chair when you read the paper. Avoid alcohol while trying to quit, and try to drink fewer caffeinated beverages. Alcohol and caffeine may urge you to smoke.  Avoid foods and drinks that can trigger a desire to smoke, such as sugary or spicy foods and alcohol.  Ask people who smoke not to smoke around you.  Have something planned to do right after eating or having a cup of coffee. For example, plan to take a walk or exercise.  Try a relaxation exercise to calm you down and decrease your stress. Remember, you may be tense and nervous for the first 2 weeks after you quit, but this will pass.  Find new activities to keep your hands busy. Play with a pen, coin, or rubber band. Doodle or draw things on paper.  Brush your teeth right after eating. This will help cut down on the craving for the taste of tobacco after meals. You can also try mouthwash.   Use oral substitutes in place of cigarettes. Try using lemon drops, carrots, cinnamon sticks, or chewing gum. Keep them handy so they are available when you have  the urge to smoke.  When you have the urge to smoke, try deep breathing.  Designate your home as a nonsmoking area.  If you are a heavy smoker, ask your health care provider about a prescription for nicotine chewing gum. It can ease your withdrawal from nicotine.  Reward yourself. Set aside the cigarette money you save and buy yourself something nice.  Look for support from others. Join a support group or smoking cessation program. Ask someone at home or at work to help you with your plan to  quit smoking.  Always ask yourself, "Do I need this cigarette or is this just a reflex?" Tell yourself, "Today, I choose not to smoke," or "I do not want to smoke." You are reminding yourself of your decision to quit.  Do not replace cigarette smoking with electronic cigarettes (commonly called e-cigarettes). The safety of e-cigarettes is unknown, and some may contain harmful chemicals.  If you relapse, do not give up! Plan ahead and think about what you will do the next time you get the urge to smoke. HOW WILL I FEEL WHEN I QUIT SMOKING? You may have symptoms of withdrawal because your body is used to nicotine (the addictive substance in cigarettes). You may crave cigarettes, be irritable, feel very hungry, cough often, get headaches, or have difficulty concentrating. The withdrawal symptoms are only temporary. They are strongest when you first quit but will go away within 10-14 days. When withdrawal symptoms occur, stay in control. Think about your reasons for quitting. Remind yourself that these are signs that your body is healing and getting used to being without cigarettes. Remember that withdrawal symptoms are easier to treat than the major diseases that smoking can cause.  Even after the withdrawal is over, expect periodic urges to smoke. However, these cravings are generally short lived and will go away whether you smoke or not. Do not smoke! WHAT RESOURCES ARE AVAILABLE TO HELP ME QUIT SMOKING? Your health care provider can direct you to community resources or hospitals for support, which may include:  Group support.  Education.  Hypnosis.  Therapy.   This information is not intended to replace advice given to you by your health care provider. Make sure you discuss any questions you have with your health care provider.   Document Released: 12/01/2003 Document Revised: 03/25/2014 Document Reviewed: 08/20/2012 Elsevier Interactive Patient Education [employer].      If you  need a refill on your cardiac medications before your next appointment, please call your pharmacy.

## 2015-06-21 NOTE — Progress Notes (Signed)
Cardiology Office Note   Date:  06/21/2015   ID:  ULUS HAZEN, DOB 06-Feb-1956, MRN [id number]  PCP:  Webb Silversmith, NP  Cardiologist:  Dr. Marlou Porch  No chief complaint on file.     History of Present Illness: Joshua [name] is a 60 y.o. male who was referred for evaluation and management of peripheral arterial disease. He has known history of coronary artery disease status post recent rotational atherectomy and drug-eluting stent placement to the mid LAD. He reports no chest pain or significant dyspnea since then. He has known history of peripheral arterial disease status post aortobifemoral bypass in 2010 at [place], diabetes mellitus with diabetic neuropathy, hyperlipidemia and tobacco use. He complains of foot burning sensation and throbbing. He reports chronic bluish discoloration of his feet for many years. He has no lower stomach ulceration. He underwent noninvasive vascular evaluation in March which showed normal ABI bilaterally, patent aorto bifemoral bypass and moderate left SFA stenosis.   Past Medical History  Diagnosis Date  . Hypertension   . COPD (chronic obstructive pulmonary disease) ([place])   . Spinal disease   . Seasonal allergies   . Positive TB test   . Phlebitis   . Shortness of breath   . GERD (gastroesophageal reflux disease)   . Hyperlipidemia   . H/O blood clots     "had them in my back; put filter in before one of my neck ORs"  . Chronic bronchitis ([place])   . Arthritis     "back" (05/16/2015)  . Chronic back pain     "all my back"  . History of gout   . CAD in native artery, occluded Lcx and rotational atherectomy to LAD with DES 05/17/15 05/18/2015    Past Surgical History  Procedure Laterality Date  . Femoral bypass  ~ 2010  . Anterior cervical decomp/discectomy fusion  ~ 2001-2009 X 3  . Back surgery    . Anterior cervical decomp/discectomy fusion N/A 10/06/2012    Procedure: ANTERIOR CERVICAL DECOMPRESSION/DISCECTOMY FUSION 1 LEVEL;   Surgeon: [name], MD;  Location: [facility];  Service: Neurosurgery;  Laterality: N/A;  C7T1 anterior cervical decompression with fusion plating and bonegraft   . Cardiac catheterization  05/16/2015  . Excisional hemorrhoidectomy    . Posterior laminectomy / decompression lumbar spine  2006    "bone spurs"  . Elbow surgery Left 2016    "had bone pieces removed"  . Ivc filter placement ([facility] hx)  2010    "had blood clots in my back"  . Cardiac catheterization N/A 05/16/2015    Procedure: Left Heart Cath and Coronary Angiography;  Surgeon: [name], MD;  Location: [place] [facility];  Service: Cardiovascular;  Laterality: N/A;  . Cardiac catheterization N/A 05/16/2015    Procedure: Coronary Balloon Angioplasty;  Surgeon: [name], MD;  Location: [place] [facility];  Service: Cardiovascular;  Laterality: N/A;  . Cardiac catheterization N/A 05/17/2015    Procedure: Coronary Stent Intervention Rotoblater;  Surgeon: [name], MD;  Location: [place] [facility];  Service: Cardiovascular;  Laterality: N/A;     Current Outpatient Prescriptions  Medication Sig Dispense Refill  . albuterol (PROVENTIL HFA;VENTOLIN HFA) 108 (90 Base) MCG/ACT inhaler Inhale 2 puffs into the lungs every 6 (six) hours as needed for wheezing or shortness of breath.    [name] aspirin 81 MG chewable tablet Chew 1 tablet (81 mg total) by mouth daily.    . Blood Glucose Monitoring  Suppl (ONETOUCH VERIO) w/Device KIT 1 Device by Does not apply route once. 1 kit 0  . BRILINTA 90 MG TABS tablet TAKE 1 TABLET BY MOUTH 2 TIMES A DAY 60 tablet 0  . cloNIDine (CATAPRES) 0.3 MG tablet Take 1 tablet (0.3 mg total) by mouth 2 (two) times daily. 180 tablet 1  . gabapentin (NEURONTIN) 800 MG tablet Take 1 tablet (800 mg total) by mouth 3 (three) times daily. 270 tablet 1  . glucose blood (ONETOUCH VERIO) test strip 1 each by Other route 3 (three) times daily. Use as instructed 100 each 11  . hydrochlorothiazide  (HYDRODIURIL) 25 MG tablet Take 1 tablet (25 mg total) by mouth daily. 90 tablet 1  . levocetirizine (XYZAL) 5 MG tablet Take 1 tablet (5 mg total) by mouth every evening. 30 tablet 2  . nitroGLYCERIN (NITROSTAT) 0.4 MG SL tablet Place 1 tablet (0.4 mg total) under the tongue every 5 (five) minutes as needed for chest pain. 25 tablet 4  . Omega-3 Fatty Acids (FISH OIL) 1200 MG CAPS Take 1 capsule by mouth 3 (three) times daily.    [name] omeprazole (PRILOSEC) 20 MG capsule Take 1 capsule (20 mg total) by mouth daily. 90 capsule 1  . ONE TOUCH LANCETS MISC 1 each by Does not apply route 3 (three) times daily. 200 each 5  . oxyCODONE-acetaminophen (PERCOCET) 7.5-325 MG tablet Take 1 tablet by mouth 3 (three) times daily as needed.    . traZODone (DESYREL) 50 MG tablet Take 1 tablet (50 mg total) by mouth at bedtime as needed for sleep. 90 tablet 1  . atorvastatin (LIPITOR) 80 MG tablet Take 1 tablet (80 mg total) by mouth daily at 6 PM. 90 tablet 3  . bisoprolol (ZEBETA) 5 MG tablet Take 0.5 tablets (2.5 mg total) by mouth daily. 90 tablet 1  . [DISCONTINUED] lisinopril (PRINIVIL,ZESTRIL) 40 MG tablet TAKE 1 TABLET (40 MG TOTAL) BY MOUTH DAILY. 90 tablet 1   Current Facility-Administered Medications  Medication Dose Route Frequency Provider Last Rate Last Dose  . levalbuterol (XOPENEX) nebulizer solution 0.63 mg  0.63 mg Nebulization Once Brett Canales, PA-C        Allergies:   Lisinopril; Contrast media; and Ibuprofen    Social History:  The patient  reports that he has been smoking Cigarettes.  He has a 11.75 pack-year smoking history. He has never used smokeless tobacco. He reports that he drinks alcohol. He reports that he does not use illicit drugs.   Family History:  The patient's family history includes Healthy in his daughter; Heart attack in his father; Hypertension in his father and mother; Stomach cancer in his mother; Stroke in his father. There is no history of Diabetes or Early death.     ROS:  Please see the history of present illness.   Otherwise, review of systems are positive for none.   All other systems are reviewed and negative.    PHYSICAL EXAM: VS:  BP 100/70 mmHg  Pulse 64  Ht '5\' 9"'  (1.753 m)  Wt 232 lb 4 oz (105.348 kg)  BMI 34.28 kg/m2 , BMI Body mass index is 34.28 kg/(m^2). GEN: Well nourished, well developed, in no acute distress HEENT: normal Neck: no JVD, carotid bruits, or masses Cardiac: RRR; no murmurs, rubs, or gallops,no edema  Respiratory:  clear to auscultation bilaterally, normal work of breathing GI: soft, nontender, nondistended, + BS MS: no deformity or atrophy Skin: warm and dry, no rash Neuro:  Strength and  sensation are intact Psych: euthymic mood, full affect Vascular: Femoral pulses are normal bilaterally. Distal pulses are not palpable with minimal edema and stasis dermatitis.  EKG:  EKG is not ordered today.   Recent Labs: 04/21/2015: ALT 22 05/18/2015: BUN 22*; Creatinine, Ser 1.23; Hemoglobin 15.9; Platelets 262; Potassium 4.6; Sodium 141    Lipid Panel    Component Value Date/Time   CHOL 175 04/21/2015 1352   TRIG 328.0* 04/21/2015 1352   HDL 31.30* 04/21/2015 1352   CHOLHDL 6 04/21/2015 1352   VLDL 65.6* 04/21/2015 1352   LDLDIRECT 107.0 04/21/2015 1352      Wt Readings from Last 3 Encounters:  06/20/15 232 lb 4 oz (105.348 kg)  05/29/15 234 lb (106.142 kg)  05/18/15 239 lb 3.2 oz (108.5 kg)         ASSESSMENT AND PLAN:  1.   Peripheral arterial disease: The patient's recent noninvasive evaluation is overall reassuring. His aortobifemoral bypass was patent. He has 2 vessel runoff below the knee. The discoloration in his feet is likely multifactorial with an element of stasis dermatitis and possible small vessel disease. I strongly advised him to quit smoking. Continue treatment of risk factors.  2. Coronary artery disease: Status post recent atherectomy and drug-eluting stent placement to the LAD. The  patient reports resolution of anginal symptoms since then.  3. Tobacco use: I discussed with him the importance of smoking cessation.    Disposition:   FU with me in 6 months  Signed,  Kathlyn Sacramento, MD  06/21/2015 4:07 PM    [place]

## 2015-06-22 DIAGNOSIS — M79672 Pain in left foot: Secondary | ICD-10-CM | POA: Diagnosis not present

## 2015-06-22 DIAGNOSIS — G8929 Other chronic pain: Secondary | ICD-10-CM | POA: Diagnosis not present

## 2015-06-22 DIAGNOSIS — M545 Low back pain: Secondary | ICD-10-CM | POA: Diagnosis not present

## 2015-06-22 DIAGNOSIS — M79671 Pain in right foot: Secondary | ICD-10-CM | POA: Diagnosis not present

## 2015-06-22 DIAGNOSIS — R208 Other disturbances of skin sensation: Secondary | ICD-10-CM | POA: Diagnosis not present

## 2015-07-10 ENCOUNTER — Other Ambulatory Visit (INDEPENDENT_AMBULATORY_CARE_PROVIDER_SITE_OTHER): Payer: Medicare Other | Admitting: *Deleted

## 2015-07-10 DIAGNOSIS — I739 Peripheral vascular disease, unspecified: Secondary | ICD-10-CM

## 2015-07-10 DIAGNOSIS — E1140 Type 2 diabetes mellitus with diabetic neuropathy, unspecified: Secondary | ICD-10-CM | POA: Diagnosis not present

## 2015-07-10 LAB — HEPATIC FUNCTION PANEL
ALT: 18 U/L (ref 9–46)
AST: 16 U/L (ref 10–35)
Albumin: 3.9 g/dL (ref 3.6–5.1)
Alkaline Phosphatase: 93 U/L (ref 40–115)
BILIRUBIN INDIRECT: 0.4 mg/dL (ref 0.2–1.2)
Bilirubin, Direct: 0.1 mg/dL (ref <=0.2)
TOTAL PROTEIN: 6.3 g/dL (ref 6.1–8.1)
Total Bilirubin: 0.5 mg/dL (ref 0.2–1.2)

## 2015-07-10 LAB — LIPID PANEL
CHOL/HDL RATIO: 4 ratio (ref <=5.0)
CHOLESTEROL: 107 mg/dL — AB (ref 125–200)
HDL: 27 mg/dL — AB (ref >=40)
LDL Cholesterol: 39 mg/dL (ref <130)
TRIGLYCERIDES: 205 mg/dL — AB (ref <150)
VLDL: 41 mg/dL — ABNORMAL HIGH (ref <30)

## 2015-07-10 NOTE — Addendum Note (Signed)
Addended by: Eulis Foster on: 07/10/2015 12:28 PM   Modules accepted: Orders

## 2015-07-14 ENCOUNTER — Telehealth: Payer: Self-pay | Admitting: *Deleted

## 2015-07-14 DIAGNOSIS — E785 Hyperlipidemia, unspecified: Secondary | ICD-10-CM

## 2015-07-14 NOTE — Telephone Encounter (Signed)
Pt is aware of his improved cholesterol panel.  He has been advised to continue the Lipitor and low fat diet, and we will repeat in 6 months, Pt verbalized understanding.  Orders in EPIC

## 2015-07-20 ENCOUNTER — Other Ambulatory Visit: Payer: Self-pay | Admitting: Cardiology

## 2015-07-20 DIAGNOSIS — Z79891 Long term (current) use of opiate analgesic: Secondary | ICD-10-CM | POA: Diagnosis not present

## 2015-07-20 DIAGNOSIS — M542 Cervicalgia: Secondary | ICD-10-CM | POA: Diagnosis not present

## 2015-07-20 DIAGNOSIS — G8929 Other chronic pain: Secondary | ICD-10-CM | POA: Diagnosis not present

## 2015-07-20 DIAGNOSIS — M79672 Pain in left foot: Secondary | ICD-10-CM | POA: Diagnosis not present

## 2015-07-20 DIAGNOSIS — M79671 Pain in right foot: Secondary | ICD-10-CM | POA: Diagnosis not present

## 2015-07-20 DIAGNOSIS — M545 Low back pain: Secondary | ICD-10-CM | POA: Diagnosis not present

## 2015-07-20 NOTE — Telephone Encounter (Signed)
Rx refill sent to pharmacy. 

## 2015-08-04 ENCOUNTER — Telehealth: Payer: Self-pay | Admitting: Internal Medicine

## 2015-08-04 NOTE — Telephone Encounter (Signed)
Spoke to spouse Bethena Roys and pt has not gone to ER yet but is still planning on going to [facility] today---Judy reports there has been no changes in pt's condition from earlier and will take him to ER

## 2015-08-04 NOTE — Telephone Encounter (Signed)
[place] Patient Name: Joshua [name] DOB: [birth year] Initial Comment Caller states he has a Blood Clot filter in his stomach on right side. Prong is sticking through. Nurse Assessment Nurse: Dimas Chyle, RN, Dellis Filbert Date/Time Eilene Ghazi Time): 08/04/2015 9:45:31 AM Confirm and document reason for call. If symptomatic, describe symptoms. You must click the next button to save text entered. ---Caller states he has a Blood Clot filter in his stomach on right side. Prong is sticking through according to report from CT scan. Has the patient traveled out of the country within the last 30 days? ---No Does the patient have any new or worsening symptoms? ---Yes Will a triage be completed? ---Yes Related visit to physician within the last 2 weeks? ---Yes Does the PT have any chronic conditions? (i.e. diabetes, asthma, etc.) ---Yes List chronic conditions. ---Hx of DVT, back surgery and neck surgery. Is this a behavioral health or substance abuse call? ---No Guidelines Guideline Title Affirmed Question Affirmed Notes Abdominal Pain - Male Patient sounds very sick or weak to the triager Final Disposition User Go to ED Now (or PCP triage) Dimas Chyle, RN, Dellis Filbert Referrals [facility] - ED Disagree/Comply: Comply

## 2015-08-04 NOTE — Telephone Encounter (Signed)
Agree, ER eval

## 2015-08-05 ENCOUNTER — Emergency Department (HOSPITAL_COMMUNITY)
Admission: EM | Admit: 2015-08-05 | Discharge: 2015-08-05 | Disposition: A | Discharge status: Home / Self Care | Payer: Medicare Other | Attending: Emergency Medicine | Admitting: Emergency Medicine

## 2015-08-05 ENCOUNTER — Encounter (HOSPITAL_COMMUNITY): Payer: Self-pay | Admitting: Emergency Medicine

## 2015-08-05 DIAGNOSIS — Z7982 Long term (current) use of aspirin: Secondary | ICD-10-CM | POA: Insufficient documentation

## 2015-08-05 DIAGNOSIS — J449 Chronic obstructive pulmonary disease, unspecified: Secondary | ICD-10-CM | POA: Diagnosis not present

## 2015-08-05 DIAGNOSIS — F17210 Nicotine dependence, cigarettes, uncomplicated: Secondary | ICD-10-CM | POA: Insufficient documentation

## 2015-08-05 DIAGNOSIS — T85698A Other mechanical complication of other specified internal prosthetic devices, implants and grafts, initial encounter: Secondary | ICD-10-CM | POA: Diagnosis not present

## 2015-08-05 DIAGNOSIS — Y718 Miscellaneous cardiovascular devices associated with adverse incidents, not elsewhere classified: Secondary | ICD-10-CM | POA: Insufficient documentation

## 2015-08-05 DIAGNOSIS — T82525A Displacement of umbrella device, initial encounter: Secondary | ICD-10-CM | POA: Insufficient documentation

## 2015-08-05 DIAGNOSIS — I10 Essential (primary) hypertension: Secondary | ICD-10-CM | POA: Diagnosis not present

## 2015-08-05 DIAGNOSIS — Z79899 Other long term (current) drug therapy: Secondary | ICD-10-CM | POA: Diagnosis not present

## 2015-08-05 DIAGNOSIS — T85618A Breakdown (mechanical) of other specified internal prosthetic devices, implants and grafts, initial encounter: Secondary | ICD-10-CM

## 2015-08-05 DIAGNOSIS — M4680 Other specified inflammatory spondylopathies, site unspecified: Secondary | ICD-10-CM | POA: Diagnosis not present

## 2015-08-05 DIAGNOSIS — I2510 Atherosclerotic heart disease of native coronary artery without angina pectoris: Secondary | ICD-10-CM | POA: Diagnosis not present

## 2015-08-05 DIAGNOSIS — R1031 Right lower quadrant pain: Secondary | ICD-10-CM | POA: Diagnosis not present

## 2015-08-05 DIAGNOSIS — R1030 Lower abdominal pain, unspecified: Secondary | ICD-10-CM | POA: Diagnosis present

## 2015-08-05 NOTE — Discharge Instructions (Signed)
You were seen and evaluated today for your displaced filter. This is likely something that has been going on for a long time and he just did not know because he did not have any imaging. There is nothing to do about it acutely at this time. Please obtain a fall detailed written report from the imaging center where you had your CT done. Follow-up with your primary care physician. They can refer you outpatient to an interventional radiologist or vascular surgeon. This was discussed with interventional radiologist on call today who agreed with this plan.

## 2015-08-05 NOTE — ED Notes (Signed)
Pt reports he had a CT scan done 2 weeks ago and received results yesterday. Imaging showed that pt's blood clot filter in R groin (placed 2010) has become malplaced with R prong into his R common iliac aorta graft. Pt has been having intermittent pain in his R RLQ.

## 2015-08-05 NOTE — ED Provider Notes (Signed)
CSN: [id number]     Arrival date & time 08/05/15  1244 History   First MD Initiated Contact with Patient 08/05/15 1325     Chief Complaint  Patient presents with  . blood clot filter malplacement      (Consider location/radiation/quality/duration/timing/severity/associated sxs/prior Treatment) HPI Comments: 60 year old male presents for evaluation regarding an abnormal CT scan. The patient reports that he is in a class action lawsuit for an IVC filter that he had placed 10 years ago at an outside facility. He said he was having a CT done just to show that it was still there and they called and told him he needed to see his primary care doctor because one of the prongs was going into the graft of his iliac artery on the right. He denies any change in sensation or discoloration of his leg. No pain in his leg. He says every once in a while when he turns he does get some sharp pain in his right groin area. Says he feels well. He thought he could follow-up with his primary care physician but was instructed to go to the emergency department and told they would not see him in the office until he did. He was told to come in yesterday but had things to do and so waited until today. He has no complaints at this time. His CT was done through the [facility] outpatient.   Past Medical History  Diagnosis Date  . Hypertension   . COPD (chronic obstructive pulmonary disease) ([place])   . Spinal disease   . Seasonal allergies   . Positive TB test   . Phlebitis   . Shortness of breath   . GERD (gastroesophageal reflux disease)   . Hyperlipidemia   . H/O blood clots     "had them in my back; put filter in before one of my neck ORs"  . Chronic bronchitis ([place])   . Arthritis     "back" (05/16/2015)  . Chronic back pain     "all my back"  . History of gout   . CAD in native artery, occluded Lcx and rotational atherectomy to LAD with DES 05/17/15 05/18/2015   Past Surgical History  Procedure Laterality  Date  . Femoral bypass  ~ 2010  . Anterior cervical decomp/discectomy fusion  ~ 2001-2009 X 3  . Back surgery    . Anterior cervical decomp/discectomy fusion N/A 10/06/2012    Procedure: ANTERIOR CERVICAL DECOMPRESSION/DISCECTOMY FUSION 1 LEVEL;  Surgeon: [name], MD;  Location: [facility];  Service: Neurosurgery;  Laterality: N/A;  C7T1 anterior cervical decompression with fusion plating and bonegraft   . Cardiac catheterization  05/16/2015  . Excisional hemorrhoidectomy    . Posterior laminectomy / decompression lumbar spine  2006    "bone spurs"  . Elbow surgery Left 2016    "had bone pieces removed"  . Ivc filter placement ([facility] hx)  2010    "had blood clots in my back"  . Cardiac catheterization N/A 05/16/2015    Procedure: Left Heart Cath and Coronary Angiography;  Surgeon: [name], MD;  Location: [place];  Service: Cardiovascular;  Laterality: N/A;  . Cardiac catheterization N/A 05/16/2015    Procedure: Coronary Balloon Angioplasty;  Surgeon: [name], MD;  Location: [place] [facility];  Service: Cardiovascular;  Laterality: N/A;  . Cardiac catheterization N/A 05/17/2015    Procedure: Coronary Stent Intervention Rotoblater;  Surgeon: [name], MD;  Location: [place] [facility];  Service: Cardiovascular;  Laterality: N/A;   Family History  Problem Relation Age of Onset  . Stomach cancer Mother     Deceased, 21s  . Hypertension Mother   . Hypertension Father   . Stroke Father   . Diabetes Neg Hx   . Early death Neg Hx   . Heart attack Father     Deceased, 35  . Healthy Daughter    Social History  Substance Use Topics  . Smoking status: Current Every Day Smoker -- 0.25 packs/day for 47 years    Types: Cigarettes  . Smokeless tobacco: Never Used  . 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  Eyes: Negative for visual disturbance.  Respiratory: Negative for shortness of breath.   Gastrointestinal: Negative for nausea, vomiting and abdominal pain.  Genitourinary: Negative for dysuria and hematuria.  Skin: Negative for pallor, rash and wound.  Neurological: Negative for dizziness, weakness, light-headedness and headaches.  Hematological: Bruises/bleeds easily (on Bralinta).      Allergies  Lisinopril; Contrast media; and Ibuprofen  Home Medications   Prior to Admission medications   Medication Sig Start Date End Date Taking? Authorizing Provider  albuterol (PROVENTIL HFA;VENTOLIN HFA) 108 (90 Base) MCG/ACT inhaler Inhale 2 puffs into the lungs every 6 (six) hours as needed for wheezing or shortness of breath.   Yes Historical Provider, MD  aspirin 81 MG chewable tablet Chew 1 tablet (81 mg total) by mouth daily. 05/18/15  Yes Isaiah Serge, NP  atorvastatin (LIPITOR) 80 MG tablet Take 1 tablet (80 mg total) by mouth daily at 6 PM. 06/20/15  Yes Isaiah Serge, NP  bisoprolol (ZEBETA) 5 MG tablet Take 0.5 tablets (2.5 mg total) by mouth daily. 06/20/15  Yes Isaiah Serge, NP  BRILINTA 90 MG TABS tablet TAKE 1 TABLET BY MOUTH 2 TIMES A DAY 07/20/15  Yes Jerline Pain, MD  cloNIDine (CATAPRES) 0.3 MG tablet Take 1 tablet (0.3 mg total) by mouth 2 (two) times daily. 03/21/15  Yes Jearld Fenton, NP  gabapentin (NEURONTIN) 800 MG tablet Take 1 tablet (800 mg total) by mouth 3 (three) times daily. 03/21/15  Yes Jearld Fenton, NP  hydrochlorothiazide (HYDRODIURIL) 25 MG tablet Take 1 tablet (25 mg total) by mouth daily. 03/21/15  Yes Jearld Fenton, NP  levocetirizine (XYZAL) 5 MG tablet Take 1 tablet (5 mg total) by mouth every evening. 05/25/15  Yes Jearld Fenton, NP  nitroGLYCERIN (NITROSTAT) 0.4 MG SL tablet Place 1 tablet (0.4 mg total) under the tongue every 5 (five) minutes as needed for chest pain. 05/18/15  Yes Isaiah Serge, NP  Omega-3 Fatty  Acids (FISH OIL) 1200 MG CAPS Take 1 capsule by mouth 3 (three) times daily.   Yes Historical Provider, MD  omeprazole (PRILOSEC) 20 MG capsule Take 1 capsule (20 mg total) by mouth daily. 03/21/15  Yes Jearld Fenton, NP  oxyCODONE-acetaminophen (PERCOCET) 7.5-325 MG tablet Take 1 tablet by mouth 3 (three) times daily as needed for moderate pain.  05/23/15  Yes Historical Provider, MD  traZODone (DESYREL) 50 MG tablet Take 1 tablet (50 mg total) by mouth at bedtime as needed for sleep. 03/21/15  Yes Jearld Fenton, NP  Blood Glucose Monitoring Suppl (ONETOUCH VERIO) w/Device KIT 1 Device  by Does not apply route once. 05/19/15   Jearld Fenton, NP  glucose blood (ONETOUCH VERIO) test strip 1 each by Other route 3 (three) times daily. Use as instructed 05/19/15   Jearld Fenton, NP  ONE TOUCH LANCETS MISC 1 each by Does not apply route 3 (three) times daily. 05/19/15   Jearld Fenton, NP   BP 122/75 mmHg  Pulse 63  Temp(Src) 98.8 F (37.1 C) (Oral)  Resp 17  SpO2 96% Physical Exam  Constitutional: He is oriented to person, place, and time. He appears well-developed and well-nourished. No distress.  HENT:  Head: Normocephalic and atraumatic.  Right Ear: External ear normal.  Left Ear: External ear normal.  Mouth/Throat: Oropharynx is clear and moist. No oropharyngeal exudate.  Eyes: EOM are normal. Pupils are equal, round, and reactive to light.  Neck: Normal range of motion. Neck supple.  Cardiovascular: Normal rate, regular rhythm, normal heart sounds and intact distal pulses.   No murmur heard. Pulses:      Dorsalis pedis pulses are 1+ on the right side, and 1+ on the left side.       Posterior tibial pulses are 1+ on the right side, and 1+ on the left side.  Pulmonary/Chest: Effort normal. No respiratory distress. He has no wheezes. He has no rales.  Abdominal: Soft. He exhibits no distension. There is no tenderness.  Musculoskeletal: He exhibits no edema.  Neurological: He is alert and oriented  to person, place, and time.  Skin: Skin is warm and dry. No rash noted. He is not diaphoretic. No pallor.  Vitals reviewed.   ED Course  Procedures (including critical care time) Labs Review Labs Reviewed - No data to display  Imaging Review No results found. I have personally reviewed and evaluated these images and lab results as part of my medical decision-making.   EKG Interpretation None      MDM  Patient was seen and evaluated in stable condition. He has diminished pulses in his bilateral feet but they're symmetric and palpable. Normal capillary refill of the feet with normal coloration. No abdominal tenderness on examination. Discussed these results with interventional radiologist on-call. We were unable to pull up the images. At this time it's likely that this finding has been there for years and is not acute. Patient is asymptomatic. The interventional radiologist recommended that the patient could follow-up outpatient with a vascular surgeon or interventional radiologist through his primary care physician. This was discussed at length with the patient and his wife at bedside. They expressed understanding and agreement. They're given strict return precautions. Patient was discharged home in stable condition. Final diagnoses:  None    1. Medical device displacement    Harvel Quale, MD 08/05/15 1810

## 2015-08-07 ENCOUNTER — Encounter: Payer: Self-pay | Admitting: Internal Medicine

## 2015-08-07 ENCOUNTER — Ambulatory Visit (INDEPENDENT_AMBULATORY_CARE_PROVIDER_SITE_OTHER): Payer: Medicare Other | Admitting: Internal Medicine

## 2015-08-07 VITALS — BP 126/68 | HR 76 | Temp 97.8°F | Wt 230.0 lb

## 2015-08-07 DIAGNOSIS — R1031 Right lower quadrant pain: Secondary | ICD-10-CM

## 2015-08-07 DIAGNOSIS — T85618A Breakdown (mechanical) of other specified internal prosthetic devices, implants and grafts, initial encounter: Principal | ICD-10-CM

## 2015-08-07 DIAGNOSIS — T85698A Other mechanical complication of other specified internal prosthetic devices, implants and grafts, initial encounter: Secondary | ICD-10-CM | POA: Diagnosis not present

## 2015-08-07 DIAGNOSIS — R1030 Lower abdominal pain, unspecified: Secondary | ICD-10-CM

## 2015-08-07 DIAGNOSIS — Z95828 Presence of other vascular implants and grafts: Secondary | ICD-10-CM

## 2015-08-07 MED ORDER — SILDENAFIL CITRATE 50 MG PO TABS: 50.0000 mg | ORAL_TABLET | Freq: Every day | ORAL | Status: DC | PRN

## 2015-08-07 NOTE — Progress Notes (Signed)
Subjective:    Patient ID: Joshua [name], male    DOB: Feb 27, 1956, 60 y.o.   MRN: [id number]  HPI  Pt presents to the clinic today for ER followup. He went to the ER for evaluation of a dislodged IVC filter seen on recent CT scan done by [facility]. The radiologist called and told him that one of the prongs was going into the graft of his right iliac artery.  IMPRESSION:   Inferior vena caval filter in position with the apex just below the level of the renal veins. The inferior aspect of the legs protrude slightly beyond the margin of the cava which is not uncommonly seen. No adjacent hemorrhage, edema, or other acute abnormalities evident.  He has the filter placed prior to a back surgery because he had "blood clots on his spine".  He has a history of a fem-fem bypass. He had not noticed any numbness, tingling, decreased sensation, swelling or pain of his right leg. He intermittently has pain in the right groin, that does not last very long. He does bruise and when he cuts himself, he usually takes a little longer to clot, because he is on Brillinta.  He is also concerned about erectile dysfunction. He can not initiate or maintain an erection. He has not been able to have intercourse over the last year with his wife. He would like to try Viagra single packs.  Review of Systems      Past Medical History  Diagnosis Date  . Hypertension   . COPD (chronic obstructive pulmonary disease) ([place])   . Spinal disease   . Seasonal allergies   . Positive TB test   . Phlebitis   . Shortness of breath   . GERD (gastroesophageal reflux disease)   . Hyperlipidemia   . H/O blood clots     "had them in my back; put filter in before one of my neck ORs"  . Chronic bronchitis ([place])   . Arthritis     "back" (05/16/2015)  . Chronic back pain     "all my back"  . History of gout   . CAD in native artery, occluded Lcx and rotational atherectomy to LAD with DES 05/17/15 05/18/2015    Current  Outpatient Prescriptions  Medication Sig Dispense Refill  . albuterol (PROVENTIL HFA;VENTOLIN HFA) 108 (90 Base) MCG/ACT inhaler Inhale 2 puffs into the lungs every 6 (six) hours as needed for wheezing or shortness of breath.    [name] aspirin 81 MG chewable tablet Chew 1 tablet (81 mg total) by mouth daily.    [name] atorvastatin (LIPITOR) 80 MG tablet Take 1 tablet (80 mg total) by mouth daily at 6 PM. 90 tablet 3  . bisoprolol (ZEBETA) 5 MG tablet Take 0.5 tablets (2.5 mg total) by mouth daily. 90 tablet 1  . Blood Glucose Monitoring Suppl (ONETOUCH VERIO) w/Device KIT 1 Device by Does not apply route once. 1 kit 0  . BRILINTA 90 MG TABS tablet TAKE 1 TABLET BY MOUTH 2 TIMES A DAY 60 tablet 0  . cloNIDine (CATAPRES) 0.3 MG tablet Take 1 tablet (0.3 mg total) by mouth 2 (two) times daily. 180 tablet 1  . gabapentin (NEURONTIN) 800 MG tablet Take 1 tablet (800 mg total) by mouth 3 (three) times daily. 270 tablet 1  . glucose blood (ONETOUCH VERIO) test strip 1 each by Other route 3 (three) times daily. Use as instructed 100 each 11  . hydrochlorothiazide (HYDRODIURIL) 25 MG tablet Take 1 tablet (  25 mg total) by mouth daily. 90 tablet 1  . levocetirizine (XYZAL) 5 MG tablet Take 1 tablet (5 mg total) by mouth every evening. 30 tablet 2  . nitroGLYCERIN (NITROSTAT) 0.4 MG SL tablet Place 1 tablet (0.4 mg total) under the tongue every 5 (five) minutes as needed for chest pain. 25 tablet 4  . Omega-3 Fatty Acids (FISH OIL) 1200 MG CAPS Take 1 capsule by mouth 3 (three) times daily.    [name] omeprazole (PRILOSEC) 20 MG capsule Take 1 capsule (20 mg total) by mouth daily. 90 capsule 1  . ONE TOUCH LANCETS MISC 1 each by Does not apply route 3 (three) times daily. 200 each 5  . oxyCODONE-acetaminophen (PERCOCET) 7.5-325 MG tablet Take 1 tablet by mouth 3 (three) times daily as needed for moderate pain.     . traZODone (DESYREL) 50 MG tablet Take 1 tablet (50 mg total) by mouth at bedtime as needed for sleep. 90 tablet  1  . sildenafil (VIAGRA) 50 MG tablet Take 1 tablet (50 mg total) by mouth daily as needed for erectile dysfunction. 3 tablet 0  . [DISCONTINUED] lisinopril (PRINIVIL,ZESTRIL) 40 MG tablet TAKE 1 TABLET (40 MG TOTAL) BY MOUTH DAILY. 90 tablet 1   Current Facility-Administered Medications  Medication Dose Route Frequency Provider Last Rate Last Dose  . levalbuterol (XOPENEX) nebulizer solution 0.63 mg  0.63 mg Nebulization Once Brett Canales, PA-C        Allergies  Allergen Reactions  . Lisinopril Anaphylaxis  . Contrast Media [Iodinated Diagnostic Agents] Hives and Itching    Pt can use if taking benadryl   . Ibuprofen Other (See Comments)    Internal bleeding    Family History  Problem Relation Age of Onset  . Stomach cancer Mother     Deceased, 85s  . Hypertension Mother   . Hypertension Father   . Stroke Father   . Diabetes Neg Hx   . Early death Neg Hx   . Heart attack Father     Deceased, 38  . Healthy Daughter     Social History   Social History  . Marital Status: Married    Spouse Name: N/A  . Number of Children: N/A  . Years of Education: 7   Occupational History  . Disabled    Social History Main Topics  . Smoking status: Current Every Day Smoker -- 0.25 packs/day for 47 years    Types: Cigarettes  . Smokeless tobacco: Never Used  . 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 Drug Use: No  . Sexual Activity: Not Currently   Other Topics Concern  . Not on file   Social History Narrative   Regular exercise-no   Caffeine Use-yes   Lives with wife in a one story home. Has 2 children.  On disability for low back pain.  Used to work as a Games developer, lasted worked in 2007.     Education: 7th grade.     Constitutional: Denies fever, malaise, fatigue, headache or abrupt weight changes.  GU: Pt reports erectile dysfunction. Denies urgency,  frequency, pain with urination, burning sensation, blood in urine, odor or discharge. Musculoskeletal: Pt reports right groin pain. Denies decrease in range of motion, difficulty with gait, muscle pain or joint pain and swelling.  Skin: Denies redness, rashes, lesions or ulcercations.   No  other specific complaints in a complete review of systems (except as listed in HPI above).  Objective:   Physical Exam   BP 126/68 mmHg  Pulse 76  Temp(Src) 97.8 F (36.6 C) (Oral)  Wt 230 lb (104.327 kg)  SpO2 98% Wt Readings from Last 3 Encounters:  08/07/15 230 lb (104.327 kg)  06/20/15 232 lb 4 oz (105.348 kg)  05/29/15 234 lb (106.142 kg)    General: Appears his stated age, obese in NAD. Skin: Warm, dry and intact. No rashes, lesions or ulcerations noted. Cardiovascular: Pedal pulses 1+ bilaterally, but feet warm. Abdomen: Soft and nontender. Normal bowel sounds. Musculoskeletal: No pain with palpation of the right groin today. Neurological: Alert and oriented. Sensation intact to bilateral lower extremities.  BMET    Component Value Date/Time   NA 141 05/18/2015 0725   K 4.6 05/18/2015 0725   CL 104 05/18/2015 0725   CO2 25 05/18/2015 0725   GLUCOSE 196* 05/18/2015 0725   BUN 22* 05/18/2015 0725   CREATININE 1.23 05/18/2015 0725   CREATININE 0.96 05/15/2015 1017   CALCIUM 8.7* 05/18/2015 0725   GFRNONAA >60 05/18/2015 0725   GFRAA >60 05/18/2015 0725    Lipid Panel     Component Value Date/Time   CHOL 107* 07/10/2015 1228   TRIG 205* 07/10/2015 1228   HDL 27* 07/10/2015 1228   CHOLHDL 4.0 07/10/2015 1228   VLDL 41* 07/10/2015 1228   LDLCALC 39 07/10/2015 1228    CBC    Component Value Date/Time   WBC 27.0* 05/18/2015 0725   RBC 5.53 05/18/2015 0725   HGB 15.9 05/18/2015 0725   HCT 47.7 05/18/2015 0725   PLT 262 05/18/2015 0725   MCV 86.3 05/18/2015 0725   MCH 28.8 05/18/2015 0725   MCHC 33.3 05/18/2015 0725   RDW 16.3* 05/18/2015 0725   LYMPHSABS 3.1  05/15/2015 1017   MONOABS 1.2* 05/15/2015 1017   EOSABS 0.3 05/15/2015 1017   BASOSABS 0.1 05/15/2015 1017    Hgb A1C Lab Results  Component Value Date   HGBA1C 6.2 01/31/2015        Assessment & Plan:   Malfunction of medical device, IVC filter:  ER notes, and CT scan from [facility] reviewed He is currently involved with a lawyer and lawsuit against the manufacturer of the IVC Filter He wishes to have it removed and further evaluation to see if he needs another placed Referral placed to vascular surgeon- see Rosaria Ferries on the way out to schedule  Erectile Dysfunction:  eRx for Viagra 50 mg single packs- advised him to start off by cutting them in half Precautions given regarding headache, dizziness, chest pain or chest tightness and prolonged erection  RTC in 1 month for annual exam

## 2015-08-07 NOTE — Patient Instructions (Signed)
Inferior Vena Cava Filter Insertion Insertion of an inferior vena cava (IVC) filter is a procedure in which a filter is placed into the large vein in your abdomen that carries blood from the lower part of your body to your heart (inferior vena cava). Placement of the filter here helps prevent blood clots in the legs or pelvis from traveling to your lungs. A large blood clot in the lungs can cause death.  The filter is a small, metal device about an inch long. It is shaped like the spokes of an umbrella and is inserted through a pathway created in your neck or groin. The risks of this procedure are usually small and easily managed. Inferior vena cava filters are only used when blood thinners (anticoagulants) cannot be used to prevent blood clots from forming. This may occur because of:  You have severe platelet problems or shortage.  You have had recent or current major bleeding that cannot be treated.  You have bleeding associated with anticoagulants.  You have recurrence of blood clots while on anticoagulants.  You have a need for surgery in the near future.  You have bleeding in your head. EXPECTATIONS OF A FILTER  An IVC filter will reduce the risk of a large blood clot making its way to your lungs (pulmonary embolus, or PE). It cannot eliminate the risk completely, or prevent small PEs from occurring.  It does not stop blood clots from growing, recurring, or developing into postphlebitic syndrome. This is a condition that can occur after there is inflammation in a vein (phlebitis). LET [facility] CARE PROVIDER KNOW ABOUT:  Any allergies you have. This includes an allergy to iodine or contrast dye.  All medicines you are taking, including vitamins, herbs, eye drops, creams and over-the-counter medicines.  Previous problems you or members of your family have had with the use of anesthetics.  Any blood disorders you have.  Previous surgeries you had had.  Medical conditions you  have.  Possibility of pregnancy, if this applies. RISKS AND COMPLICATIONS Generally, this is a safe procedure. However, as with any procedure, problems can occur. Possible problems include:  A small bruise around the needle insertion site. A larger pooling of blood called a hematoma may form. This is usually of no concern.  The filter can block the vena cava. This can cause some swelling of the legs.  The filter may eventually fail and not work properly.  Continued bleeding or infections (uncommon).  Damage to the vein by the catheter (rare). BEFORE THE PROCEDURE  Your health care provider may want you to have blood tests. These tests can help tell how well your kidneys and liver are working. They can also show how well your blood clots.  If you take blood thinners, ask your health care provider if and when you should stop taking them.  Do not eat or drink for 4 hours before the procedure or as directed by your health care provider.  Make arrangements for someone to drive you home. Depending on the procedure, you may be able to go home the same day.  PROCEDURE   Insertion of a Vena Cava filter is performed by a vascular surgeon or cardiologist in a cardiac catheterization lab.  The procedure usually takes about 30 minutes to 1 hour. This can vary.  An IV needle will be inserted into one of your veins. Medicine will be able to flow directly into your body through this needle.  Medicines may given to help you relax  and relieve anxiety (sedative).  The procedure is done through a large vein either in your neck or groin. The skin around this area is cleaned and shaved, as necessary.  The skin and deeper tissues over the vein will be made numb with a local anesthetic. You are awake during the procedure and can let your health care providers know if you have discomfort.  A needle is then put into the vein. A guide wire is placed through the needle and into the vein. This is used to  help insert a catheter and the IVC filter into your vein.  Contrast dye may be injected into the inferior vena cava to help guide the catheter and verify precise placement of the IVC filter. X-ray equipment may also be used to verify that the catheter and the wire are in the correct position.  The wire is then withdrawn.  The IVC filter is then passed over the catheter into the vein, and inserted into the correct location in the vena cava.  The catheter is then removed. Pressure will be kept on the needle insertion point for several minutes or until it is unlikely to bleed. AFTER THE PROCEDURE  You will stay in a recovery area until any sedation medicine you were given has worn off. Your blood pressure and pulse will be checked.  If there are no problems, you should be able to go home after the procedure.  You may feel sore at the area of the needle insertion for a few days.   This information is not intended to replace advice given to you by your health care provider. Make sure you discuss any questions you have with your health care provider.   Document Released: 04/24/2005 Document Revised: 03/25/2014 Document Reviewed: 11/09/2012 Elsevier Interactive Patient Education [employer].

## 2015-08-07 NOTE — Progress Notes (Signed)
Pre visit review using our clinic review tool, if applicable. No additional management support is needed unless otherwise documented below in the visit note. 

## 2015-08-10 ENCOUNTER — Ambulatory Visit (INDEPENDENT_AMBULATORY_CARE_PROVIDER_SITE_OTHER): Payer: Medicare Other | Admitting: Cardiology

## 2015-08-10 ENCOUNTER — Encounter: Payer: Self-pay | Admitting: Cardiology

## 2015-08-10 ENCOUNTER — Ambulatory Visit (INDEPENDENT_AMBULATORY_CARE_PROVIDER_SITE_OTHER): Payer: Medicare Other | Admitting: Vascular Surgery

## 2015-08-10 ENCOUNTER — Encounter: Payer: Self-pay | Admitting: Vascular Surgery

## 2015-08-10 VITALS — BP 109/71 | HR 54 | Ht 69.0 in | Wt 233.1 lb

## 2015-08-10 VITALS — BP 130/86 | HR 56 | Ht 69.0 in | Wt 232.8 lb

## 2015-08-10 DIAGNOSIS — I2510 Atherosclerotic heart disease of native coronary artery without angina pectoris: Secondary | ICD-10-CM | POA: Diagnosis not present

## 2015-08-10 DIAGNOSIS — I10 Essential (primary) hypertension: Secondary | ICD-10-CM | POA: Diagnosis not present

## 2015-08-10 DIAGNOSIS — Z720 Tobacco use: Secondary | ICD-10-CM

## 2015-08-10 DIAGNOSIS — I2583 Coronary atherosclerosis due to lipid rich plaque: Secondary | ICD-10-CM

## 2015-08-10 DIAGNOSIS — I739 Peripheral vascular disease, unspecified: Secondary | ICD-10-CM

## 2015-08-10 MED ORDER — CLOPIDOGREL BISULFATE 75 MG PO TABS: 75.0000 mg | ORAL_TABLET | Freq: Every day | ORAL | Status: DC

## 2015-08-10 NOTE — Patient Instructions (Signed)
Medication Instructions:  Please stop your Brilliant. Start Plavix - take 4 tablets the first day and then 1 daily thereafter. Continue all other medications as listed.  Follow-Up: Follow up in 6 months with Dr. Marlou Porch.  You will receive a letter in the mail 2 months before you are due.  Please call us when you receive this letter to schedule your follow up appointment.  If you need a refill on your cardiac medications before your next appointment, please call your pharmacy.  Thank you for choosing [place]!!

## 2015-08-10 NOTE — Progress Notes (Signed)
Referring Physician: Jearld Fenton, NP  Patient name: Joshua [name] MRN: [id number] DOB: 09/24/1955 Sex: male  REASON FOR CONSULT: Removal of inferior vena cava filter  HPI: Joshua [name] is a 60 y.o. male, who is requesting removal of an IVC filter. The patient had a filter placed in 2010 at [facility] in [place]. He states that he had had a back operation and formed some sort of clot after the back operation. He then subsequently required some type of neck surgery and an inferior vena cava was placed prophylactically prior to this. He does not really describe any prior history of DVT. He is not currently on anticoagulation. He does not complain of leg swelling. He apparently underwent a screening CT scan on the recommendation of an attorney after he had seen a commercial on TV saying the IVC filter should be removed. He brought this CT scan for review today as well. He complains of intermittent burning and stinging in his right groin and both legs. He states that his legs hurt all the time. He states that his legs become weak after walking about 20 feet. These did not really somewhat claudication-type symptoms. He denies any specific symptoms related to his IVC filter. He was concerned about the filter because of the findings that were dictated that the struts of the filter were adjacent to his aortobifemoral bypass graft. Other medical problems include peripheral arterial disease with previous aortobifemoral bypass by Dr. Florene Glen at [facility]. He also has a history of hypertension, COPD chronic back pain and coronary artery disease. He has had coronary stenting in the past. He also is a current smoker.  Past Medical History  Diagnosis Date  . Hypertension   . COPD (chronic obstructive pulmonary disease) ([place])   . Spinal disease   . Seasonal allergies   . Positive TB test   . Phlebitis   . Shortness of breath   . GERD (gastroesophageal  reflux disease)   . Hyperlipidemia   . H/O blood clots     "had them in my back; put filter in before one of my neck ORs"  . Chronic bronchitis ([place])   . Arthritis     "back" (05/16/2015)  . Chronic back pain     "all my back"  . History of gout   . CAD in native artery, occluded Lcx and rotational atherectomy to LAD with DES 05/17/15 05/18/2015   Past Surgical History  Procedure Laterality Date  . Femoral bypass  ~ 2010  . Anterior cervical decomp/discectomy fusion  ~ 2001-2009 X 3  . Back surgery    . Anterior cervical decomp/discectomy fusion N/A 10/06/2012    Procedure: ANTERIOR CERVICAL DECOMPRESSION/DISCECTOMY FUSION 1 LEVEL;  Surgeon: [name], MD;  Location: [facility];  Service: Neurosurgery;  Laterality: N/A;  C7T1 anterior cervical decompression with fusion plating and bonegraft   . Cardiac catheterization  05/16/2015  . Excisional hemorrhoidectomy    . Posterior laminectomy / decompression lumbar spine  2006    "bone spurs"  . Elbow surgery Left 2016    "had bone pieces removed"  . [facility]vc filter placement ([facility] hx)  2010    "had blood clots in my back"  . Cardiac catheterization N/A 05/16/2015    Procedure: Left Heart Cath and Coronary Angiography;  Surgeon: [name], MD;  Location: [place] [facility];  Service: Cardiovascular;  Laterality: N/A;  . Cardiac catheterization N/A 05/16/2015  Procedure: Coronary Balloon Angioplasty;  Surgeon: [name], MD;  Location: [place] [facility];  Service: Cardiovascular;  Laterality: N/A;  . Cardiac catheterization N/A 05/17/2015    Procedure: Coronary Stent Intervention Rotoblater;  Surgeon: [name], MD;  Location: [place] [facility];  Service: Cardiovascular;  Laterality: N/A;    Family History  Problem Relation Age of Onset  . Stomach cancer Mother     Deceased, 60s  . Hypertension Mother   . Hypertension Father   . Stroke Father   . Diabetes Neg Hx   . Early death Neg Hx   . Heart attack Father      Deceased, 35  . Healthy Daughter     SOCIAL HISTORY: Social History   Social History  . Marital Status: Married    Spouse Name: N/A  . Number of Children: N/A  . Years of Education: 7   Occupational History  . Disabled    Social History Main Topics  . Smoking status: Current Every Day Smoker -- 0.25 packs/day for 47 years    Types: Cigarettes  . Smokeless tobacco: Never Used  . 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 Drug Use: No  . Sexual Activity: Not Currently   Other Topics Concern  . Not on file   Social History Narrative   Regular exercise-no   Caffeine Use-yes   Lives with wife in a one story home. Has 2 children.  On disability for low back pain.  Used to work as a Games developer, lasted worked in 2007.     Education: 7th grade.    Allergies  Allergen Reactions  . Lisinopril Anaphylaxis  . Contrast Media [Iodinated Diagnostic Agents] Hives and Itching    Pt can use if taking benadryl   . Ibuprofen Other (See Comments)    Internal bleeding    Current Outpatient Prescriptions  Medication Sig Dispense Refill  . albuterol (PROVENTIL HFA;VENTOLIN HFA) 108 (90 Base) MCG/ACT inhaler Inhale 2 puffs into the lungs every 6 (six) hours as needed for wheezing or shortness of breath.    [name] aspirin 81 MG chewable tablet Chew 1 tablet (81 mg total) by mouth daily.    [name] atorvastatin (LIPITOR) 80 MG tablet Take 1 tablet (80 mg total) by mouth daily at 6 PM. 90 tablet 3  . bisoprolol (ZEBETA) 5 MG tablet Take 0.5 tablets (2.5 mg total) by mouth daily. 90 tablet 1  . Blood Glucose Monitoring Suppl (ONETOUCH VERIO) w/Device KIT 1 Device by Does not apply route once. 1 kit 0  . clopidogrel (PLAVIX) 75 MG tablet Take 1 tablet (75 mg total) by mouth daily. Take a total of 300 mg (4) tablets the first day. 94 tablet 3  . glucose blood (ONETOUCH VERIO) test strip 1 each  by Other route 3 (three) times daily. Use as instructed 100 each 11  . levocetirizine (XYZAL) 5 MG tablet Take 1 tablet (5 mg total) by mouth every evening. 30 tablet 2  . nitroGLYCERIN (NITROSTAT) 0.4 MG SL tablet Place 1 tablet (0.4 mg total) under the tongue every 5 (five) minutes as needed for chest pain. 25 tablet 4  . Omega-3 Fatty Acids (FISH OIL) 1200 MG CAPS Take 1 capsule by mouth 3 (three) times daily.    . ONE TOUCH LANCETS MISC 1 each by Does not apply  route 3 (three) times daily. 200 each 5  . oxyCODONE-acetaminophen (PERCOCET) 7.5-325 MG tablet Take 1 tablet by mouth 3 (three) times daily as needed for moderate pain.     . cloNIDine (CATAPRES) 0.3 MG tablet Take 1 tablet by mouth two  times daily 180 tablet 0  . gabapentin (NEURONTIN) 800 MG tablet Take 1 tablet by mouth 3  times daily 270 tablet 0  . hydrochlorothiazide (HYDRODIURIL) 25 MG tablet Take 1 tablet by mouth  daily 90 tablet 0  . omeprazole (PRILOSEC) 20 MG capsule Take 1 capsule by mouth  daily 90 capsule 0  . traZODone (DESYREL) 50 MG tablet Take 1 tablet by mouth at  bedtime as needed for sleep 90 tablet 0  . [DISCONTINUED] lisinopril (PRINIVIL,ZESTRIL) 40 MG tablet TAKE 1 TABLET (40 MG TOTAL) BY MOUTH DAILY. 90 tablet 1   Current Facility-Administered Medications  Medication Dose Route Frequency Provider Last Rate Last Dose  . levalbuterol (XOPENEX) nebulizer solution 0.63 mg  0.63 mg Nebulization Once Brett Canales, PA-C        ROS:   General:  No weight loss, Fever, chills  HEENT: No recent headaches, no nasal bleeding, no visual changes, no sore throat  Neurologic: No dizziness, blackouts, seizures. No recent symptoms of stroke or mini- stroke. No recent episodes of slurred speech, or temporary blindness.  Cardiac: No recent episodes of chest pain/pressure, no shortness of breath at rest.  + shortness of breath with exertion.  Denies history of atrial fibrillation or irregular heartbeat  Vascular: No  history of rest pain in feet.  No history of claudication.  No history of non-healing ulcer, No history of DVT   Pulmonary: No home oxygen, no productive cough, no hemoptysis,  No asthma or wheezing  Musculoskeletal:  '[ ]'  Arthritis, [x ] Low back pain,  '[ ]'  Joint pain  Hematologic:No history of hypercoagulable state.  No history of easy bleeding.  No history of anemia  Gastrointestinal: No hematochezia or melena,  No gastroesophageal reflux, no trouble swallowing  Urinary: '[ ]'  chronic Kidney disease, '[ ]'  on HD - '[ ]'  MWF or '[ ]'  TTHS, '[ ]'  Burning with urination, '[ ]'  Frequent urination, '[ ]'  Difficulty urinating;   Skin: No rashes  Psychological: No history of anxiety,  No history of depression   Physical Examination  Filed Vitals:   08/10/15 1001  BP: 109/71  Pulse: 54  Height: '5\' 9"'  (1.753 m)  Weight: 233 lb 1.6 oz (105.733 kg)  SpO2: 96%    Body mass index is 34.41 kg/(m^2).  General:  Alert and oriented, no acute distress HEENT: Normal Neck: No bruit or JVD Pulmonary: Clear to auscultation bilaterally Cardiac: Regular Rate and Rhythm without murmur Abdomen: Soft, non-tender, non-distended, no mass,Well-healed midline laparotomy scar Skin: No rash Extremity Pulses:  2+ radial, brachial, femoral, absent dorsalis pedis, posterior tibial pulses bilaterally Musculoskeletal: No deformity or edema  Neurologic: Upper and lower extremity motor 5/5 and symmetric  DATA:  CT scan from [facility] hospital were reviewed. This shows the filter is in good position adjacent to the renal veins just below these. There is no thrombus within the filter. All struts seem to be in normal position. The struts do abut against the right limb of aortobifemoral bypass graft but I agree with the radiologist interpretation that we routinely do see this on routine CT scans done for filters. There is no peri Filter hemorrhage there is no peri-filter hematoma there is no stranding against the aortobifemoral  limb  to suggest any infection. The aortobifemoral appears to be an end-to-end anastomosis and the limbs are widely patent  I also reviewed the patient's recent ABIs which were 0.99 on the right and one on the left he also had a recent arterial duplex scan from [facility] dated 05/25/2015 which showed some evidence of superficial femoral artery occlusive disease in the left leg  ASSESSMENT:  Asymptomatic IVC filter. I would not consider removal of this filter for several reasons.  #1 the filter was placed in may years ago and over time filters become more difficult to remove #2 there are no obvious associated complications with this filter #3 if it was difficult to remove the filter from a percutaneous approach and this needed to be converted to an open removal of the filter this would be extremely difficult due to the patient's previous aortobifemoral bypass. All of these issues were discussed with the patient today. I also reassured the patient that overall most IVC filters have very little morbidity long-term and I certainly believe that the morbidity potentially caused by trying to remove this filter greatly outweighs any benefit.   PLAN:  Patient will follow-up with [place] on as-needed basis for his peripheral arterial disease I do not believe he needs further follow-up for his IVC filter.   Ruta Hinds, MD [facility] Office: [phone] Pager: [phone]

## 2015-08-10 NOTE — Progress Notes (Signed)
Cardiology Office Note    Date:  08/10/2015   ID:  Joshua [name], DOB 1955-07-20, MRN [id number]  PCP:  Joshua Silversmith, NP  Cardiologist:   Candee Furbish, MD     History of Present Illness:  Joshua [name] is a 60 y.o. male here for follow up CAD post rotational athrectomy of LAD 05/18/15, Dr. Royston Bake, following abnormal stress test, mild chest pain. Originally came to see me for safety of long-term pain management at the request of Dr. Garnette Gunner. In review of office notes, he went to a pain management clinic on 04/20/15 and had diagnostic studies done there and he was told that "heart will explode "and they did not prescribe any pain medication unless he had a letter from his primary care physician saying it is okay to proceed with pain medication. He underwent autonomic balance analysis. 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 Clinical protection of the heart is recommended for example beta blockers, ace inhibitors, ARB or calcium channel blockers and additional cardiovascular workouts. Symptoms such as on the static hypotension, heat intolerance, vagal dystonia. This may be a sign of mental fatigue, chronic stress and possibly presence of Chronic health condition affecting. Subhepatic nervous system. Deep breathing showed normal parasympathetic response to stimulation. Valsalva showed hyperactive sympathetic response to stimulation and could be a cause for potential hypertension. Standing indicated parasympathetic surge. Heart rate upon standing is considered low.  [place], PVD, Fem Pop. Dr. Florene Glen. Has abdominal scar. Has bluish discoloration of his feet for many years. Saw Dr. Fletcher [name]. His aortobifemoral bypass was patent. He has 2 vessel runoff below the knee. The discoloration in his feet is likely multifactorial with an element of stasis dermatitis and possible small vessel disease.  He also has noted some  symptoms which he is attributing to the Brilinta, shortness of breath at rest that comes out of nowhere. He was requesting a switch to Plavix. Does have wheeze occasionally from smoking.  In review of PCP note: He went to the ER for evaluation of a dislodged IVC filter seen on recent CT scan done by [facility]. The radiologist called and told him that one of the prongs was going into the graft of his right iliac artery.  IMPRESSION:   Inferior vena caval filter (2010) in position with the apex just below the level of the renal veins. The inferior aspect of the legs protrude slightly beyond the margin of the cava which is not uncommonly seen. No adjacent hemorrhage, edema, or other acute abnormalities evident. There is an overview report on 07/13/15 (outside film read by radiologist in [place]) noted as IVC filter perforation extending into iliac.   He has the filter placed prior to a back surgery because he had "blood clots on his spine". Was referred to vascular surgery, Dr. Donnetta Hutching.     Past Medical History  Diagnosis Date  . Hypertension   . COPD (chronic obstructive pulmonary disease) ([place])   . Spinal disease   . Seasonal allergies   . Positive TB test   . Phlebitis   . Shortness of breath   . GERD (gastroesophageal reflux disease)   . Hyperlipidemia   . H/O blood clots     "had them in my back; put filter in before one of my neck ORs"  . Chronic bronchitis ([place])   . Arthritis     "back" (05/16/2015)  . Chronic back pain     "all my  back"  . History of gout   . CAD in native artery, occluded Lcx and rotational atherectomy to LAD with DES 05/17/15 05/18/2015    Past Surgical History  Procedure Laterality Date  . Femoral bypass  ~ 2010  . Anterior cervical decomp/discectomy fusion  ~ 2001-2009 X 3  . Back surgery    . Anterior cervical decomp/discectomy fusion N/A 10/06/2012    Procedure: ANTERIOR CERVICAL DECOMPRESSION/DISCECTOMY FUSION 1 LEVEL;  Surgeon: [name], MD;   Location: [facility];  Service: Neurosurgery;  Laterality: N/A;  C7T1 anterior cervical decompression with fusion plating and bonegraft   . Cardiac catheterization  05/16/2015  . Excisional hemorrhoidectomy    . Posterior laminectomy / decompression lumbar spine  2006    "bone spurs"  . Elbow surgery Left 2016    "had bone pieces removed"  . Ivc filter placement ([facility] hx)  2010    "had blood clots in my back"  . Cardiac catheterization N/A 05/16/2015    Procedure: Left Heart Cath and Coronary Angiography;  Surgeon: [name], MD;  Location: [place] [facility];  Service: Cardiovascular;  Laterality: N/A;  . Cardiac catheterization N/A 05/16/2015    Procedure: Coronary Balloon Angioplasty;  Surgeon: [name], MD;  Location: [place] [facility];  Service: Cardiovascular;  Laterality: N/A;  . Cardiac catheterization N/A 05/17/2015    Procedure: Coronary Stent Intervention Rotoblater;  Surgeon: [name], MD;  Location: [place] [facility];  Service: Cardiovascular;  Laterality: N/A;    Outpatient Prescriptions Prior to Visit  Medication Sig Dispense Refill  . albuterol (PROVENTIL HFA;VENTOLIN HFA) 108 (90 Base) MCG/ACT inhaler Inhale 2 puffs into the lungs every 6 (six) hours as needed for wheezing or shortness of breath.    [name] aspirin 81 MG chewable tablet Chew 1 tablet (81 mg total) by mouth daily.    [name] atorvastatin (LIPITOR) 80 MG tablet Take 1 tablet (80 mg total) by mouth daily at 6 PM. 90 tablet 3  . bisoprolol (ZEBETA) 5 MG tablet Take 0.5 tablets (2.5 mg total) by mouth daily. 90 tablet 1  . Blood Glucose Monitoring Suppl (ONETOUCH VERIO) w/Device KIT 1 Device by Does not apply route once. 1 kit 0  . cloNIDine (CATAPRES) 0.3 MG tablet Take 1 tablet (0.3 mg total) by mouth 2 (two) times daily. 180 tablet 1  . gabapentin (NEURONTIN) 800 MG tablet Take 1 tablet (800 mg total) by mouth 3 (three) times daily. 270 tablet 1  . glucose blood (ONETOUCH VERIO) test strip 1 each by  Other route 3 (three) times daily. Use as instructed 100 each 11  . hydrochlorothiazide (HYDRODIURIL) 25 MG tablet Take 1 tablet (25 mg total) by mouth daily. 90 tablet 1  . levocetirizine (XYZAL) 5 MG tablet Take 1 tablet (5 mg total) by mouth every evening. 30 tablet 2  . nitroGLYCERIN (NITROSTAT) 0.4 MG SL tablet Place 1 tablet (0.4 mg total) under the tongue every 5 (five) minutes as needed for chest pain. 25 tablet 4  . Omega-3 Fatty Acids (FISH OIL) 1200 MG CAPS Take 1 capsule by mouth 3 (three) times daily.    [name] omeprazole (PRILOSEC) 20 MG capsule Take 1 capsule (20 mg total) by mouth daily. 90 capsule 1  . ONE TOUCH LANCETS MISC 1 each by Does not apply route 3 (three) times daily. 200 each 5  . oxyCODONE-acetaminophen (PERCOCET) 7.5-325 MG tablet Take 1 tablet by mouth 3 (three) times daily as needed for moderate  pain.     . traZODone (DESYREL) 50 MG tablet Take 1 tablet (50 mg total) by mouth at bedtime as needed for sleep. 90 tablet 1  . BRILINTA 90 MG TABS tablet TAKE 1 TABLET BY MOUTH 2 TIMES A DAY 60 tablet 0  . sildenafil (VIAGRA) 50 MG tablet Take 1 tablet (50 mg total) by mouth daily as needed for erectile dysfunction. 3 tablet 0   Facility-Administered Medications Prior to Visit  Medication Dose Route Frequency Provider Last Rate Last Dose  . levalbuterol (XOPENEX) nebulizer solution 0.63 mg  0.63 mg Nebulization Once Dwana Melena, PA-C         Allergies:   Lisinopril; Contrast media; and Ibuprofen   Social History   Social History  . Marital Status: Married    Spouse Name: N/A  . Number of Children: N/A  . Years of Education: 7   Occupational History  . Disabled    Social History Main Topics  . Smoking status: Current Every Day Smoker -- 0.25 packs/day for 47 years    Types: Cigarettes  . Smokeless tobacco: Never Used  . 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 Drug Use: No  . Sexual Activity: Not Currently   Other Topics Concern  . None   Social History Narrative   Regular exercise-no   Caffeine Use-yes   Lives with wife in a one story home. Has 2 children.  On disability for low back pain.  Used to work as a Music therapist, lasted worked in 2007.     Education: 7th grade.     Family History:  The patient's family history includes Healthy in his daughter; Heart attack in his father; Hypertension in his father and mother; Stomach cancer in his mother; Stroke in his father. There is no history of Diabetes or Early death.   ROS:   Please see the history of present illness.    ROS back pain, muscle pain, leg swelling, shortness of breath with activity, wheezing, snoring, leg pain All other systems reviewed and are negative.   PHYSICAL EXAM:   VS:  BP 130/86 mmHg  Pulse 56  Ht 5\' 9"  (1.753 m)  Wt 232 lb 12.8 oz (105.597 kg)  BMI 34.36 kg/m2   GEN: Well nourished, well developed, in no acute distress HEENT: normal Neck: no JVD, carotid bruits, or masses Cardiac: RRR; no murmurs, rubs, or gallops,no edema  Respiratory:  Mild wheezes heard bilateral lower lobes again, normal respiratory effort.  GI: soft, nontender, nondistended, + BS, abdominal scar noted MS: no deformity or atrophy Skin: warm and dry, no rash, diminished distal pulses Neuro:  Alert and Oriented x 3, Strength and sensation are intact Psych: euthymic mood, full affect  Wt Readings from Last 3 Encounters:  08/10/15 232 lb 12.8 oz (105.597 kg)  08/07/15 230 lb (104.327 kg)  06/20/15 232 lb 4 oz (105.348 kg)      Studies/Labs Reviewed:   EKG:   05/08/15-sinus rhythm, 68, nonspecific ST-T wave changes  Recent Labs: 05/18/2015: BUN 22*; Creatinine, Ser 1.23; Hemoglobin 15.9; Platelets 262; Potassium 4.6; Sodium 141 07/10/2015: ALT 18   Lipid Panel    Component Value Date/Time   CHOL 107* 07/10/2015 1228   TRIG 205* 07/10/2015 1228   HDL 27*  07/10/2015 1228   CHOLHDL 4.0 07/10/2015 1228   VLDL 41*  07/10/2015 1228   [place] 07/10/2015 1228   LDLDIRECT 107.0 04/21/2015 1352    Additional studies/ records that were reviewed today include:  Prior office records reviewed, lab work reviewed, EKGs reviewed, autonomic study reviewed    ASSESSMENT:    1. PAD (peripheral artery disease) ([place])   2. Tobacco use   3. Coronary artery disease due to lipid rich plaque   4. Essential hypertension      PLAN:  In order of problems listed above:  1. Coronary artery disease-post Dr. Royston Bake rotational atherectomy and drug-eluting stent placement to the mid LAD. Mild chest pressure prior to stent placement noted when exerting himself. With his extensive peripheral vascular disease history, smoking history we will continue with aggressive secondary prevention. Continue with dual antiplatelet therapy for at least one year. Could continue aspirin and Plavix lifelong because of his peripheral vascular disease. Because of his symptoms which she is associating with purulent, we will stop her left and a load Plavix 300 mg 1 and then 75 mg thereafter.  2. Peripheral vascular disease-suffers with neuropathy as well as. Gabapentin. Smoking cessation. Exercise. He and his wife are going to the gym. Saw Dr. Fletcher [name],  [place] aortobifemoral bypass in 2010 at [facility]. His aortobifemoral bypass was patent. He has 2 vessel runoff below the knee. The discoloration in his feet is likely multifactorial with an element of stasis dermatitis and possible small vessel disease. 3. Tobacco use-encouraged cessation. This is very important for him. He asked about medication to help him quit, discussed potential side effects, dreams for instance with [place], I encouraged natural cessation. He is down to a quarter of a pack. 4. IVC filter - seeing Dr. Donnetta Hutching. Prong extension outside of IVC. 5. Diabetic neuropathy as above. Controlled diabetes. Joshua Silversmith, NP is his  primary, working hard with him. 6. Chronic pain control-has had cervical spine surgery, back pain, leg pain, foot pain.    Medication Adjustments/Labs and Tests Ordered: Current medicines are reviewed at length with the patient today.  Concerns regarding medicines are outlined above.  Medication changes, Labs and Tests ordered today are listed in the Patient Instructions below. Patient Instructions  Medication Instructions:  Please stop your Brilliant. Start Plavix - take 4 tablets the first day and then 1 daily thereafter. Continue all other medications as listed.  Follow-Up: Follow up in 6 months with Dr. Marlou Porch.  You will receive a letter in the mail 2 months before you are due.  Please call us when you receive this letter to schedule your follow up appointment.  If you need a refill on your cardiac medications before your next appointment, please call your pharmacy.  Thank you for choosing [facility]!!            Signed, Candee Furbish, MD  08/10/2015 8:51 AM    [address], [place], [place]  [address] Phone: [phone]; Fax: [phone]

## 2015-08-11 ENCOUNTER — Other Ambulatory Visit: Payer: Self-pay | Admitting: Internal Medicine

## 2015-08-15 ENCOUNTER — Encounter: Payer: Medicare Other | Admitting: Vascular Surgery

## 2015-08-17 DIAGNOSIS — R208 Other disturbances of skin sensation: Secondary | ICD-10-CM | POA: Diagnosis not present

## 2015-08-17 DIAGNOSIS — M79604 Pain in right leg: Secondary | ICD-10-CM | POA: Diagnosis not present

## 2015-08-17 DIAGNOSIS — M25551 Pain in right hip: Secondary | ICD-10-CM | POA: Diagnosis not present

## 2015-08-17 DIAGNOSIS — Z79891 Long term (current) use of opiate analgesic: Secondary | ICD-10-CM | POA: Diagnosis not present

## 2015-08-17 DIAGNOSIS — G894 Chronic pain syndrome: Secondary | ICD-10-CM | POA: Diagnosis not present

## 2015-09-18 DIAGNOSIS — M79671 Pain in right foot: Secondary | ICD-10-CM | POA: Diagnosis not present

## 2015-09-18 DIAGNOSIS — G629 Polyneuropathy, unspecified: Secondary | ICD-10-CM | POA: Diagnosis not present

## 2015-09-18 DIAGNOSIS — M79672 Pain in left foot: Secondary | ICD-10-CM | POA: Diagnosis not present

## 2015-09-18 DIAGNOSIS — G894 Chronic pain syndrome: Secondary | ICD-10-CM | POA: Diagnosis not present

## 2015-09-18 DIAGNOSIS — Z79891 Long term (current) use of opiate analgesic: Secondary | ICD-10-CM | POA: Diagnosis not present

## 2015-09-18 DIAGNOSIS — M545 Low back pain: Secondary | ICD-10-CM | POA: Diagnosis not present

## 2015-09-20 ENCOUNTER — Telehealth: Payer: Self-pay | Admitting: Internal Medicine

## 2015-09-20 NOTE — Telephone Encounter (Signed)
Patient brought in a Application for handicapped drivers registration plate form to be filled out.  It is in the provider's incoming box.

## 2015-09-25 ENCOUNTER — Encounter: Payer: Self-pay | Admitting: Internal Medicine

## 2015-09-25 ENCOUNTER — Ambulatory Visit: Payer: Medicare Other | Admitting: Internal Medicine

## 2015-09-25 ENCOUNTER — Other Ambulatory Visit: Payer: Self-pay | Admitting: Internal Medicine

## 2015-09-25 ENCOUNTER — Ambulatory Visit (INDEPENDENT_AMBULATORY_CARE_PROVIDER_SITE_OTHER): Payer: Medicare Other | Admitting: Internal Medicine

## 2015-09-25 VITALS — BP 112/68 | HR 76 | Temp 97.8°F | Ht 69.0 in | Wt 226.5 lb

## 2015-09-25 DIAGNOSIS — Z0000 Encounter for general adult medical examination without abnormal findings: Secondary | ICD-10-CM | POA: Diagnosis not present

## 2015-09-25 DIAGNOSIS — I2510 Atherosclerotic heart disease of native coronary artery without angina pectoris: Secondary | ICD-10-CM

## 2015-09-25 DIAGNOSIS — G4700 Insomnia, unspecified: Secondary | ICD-10-CM

## 2015-09-25 DIAGNOSIS — E1142 Type 2 diabetes mellitus with diabetic polyneuropathy: Secondary | ICD-10-CM

## 2015-09-25 DIAGNOSIS — J449 Chronic obstructive pulmonary disease, unspecified: Secondary | ICD-10-CM | POA: Diagnosis not present

## 2015-09-25 DIAGNOSIS — Z114 Encounter for screening for human immunodeficiency virus [HIV]: Secondary | ICD-10-CM

## 2015-09-25 DIAGNOSIS — Z125 Encounter for screening for malignant neoplasm of prostate: Secondary | ICD-10-CM

## 2015-09-25 DIAGNOSIS — I739 Peripheral vascular disease, unspecified: Secondary | ICD-10-CM | POA: Diagnosis not present

## 2015-09-25 DIAGNOSIS — I10 Essential (primary) hypertension: Secondary | ICD-10-CM | POA: Diagnosis not present

## 2015-09-25 DIAGNOSIS — Z720 Tobacco use: Secondary | ICD-10-CM | POA: Diagnosis not present

## 2015-09-25 DIAGNOSIS — M5010 Cervical disc disorder with radiculopathy, unspecified cervical region: Secondary | ICD-10-CM

## 2015-09-25 DIAGNOSIS — K219 Gastro-esophageal reflux disease without esophagitis: Secondary | ICD-10-CM

## 2015-09-25 DIAGNOSIS — R1310 Dysphagia, unspecified: Secondary | ICD-10-CM

## 2015-09-25 DIAGNOSIS — Z1159 Encounter for screening for other viral diseases: Secondary | ICD-10-CM

## 2015-09-25 LAB — PSA, MEDICARE: PSA: 0.93 ng/ml (ref 0.10–4.00)

## 2015-09-25 LAB — MICROALBUMIN / CREATININE URINE RATIO
CREATININE, U: 65.4 mg/dL
MICROALB UR: 3.4 mg/dL — AB (ref 0.0–1.9)
Microalb Creat Ratio: 5.2 mg/g (ref 0.0–30.0)

## 2015-09-25 LAB — HIV ANTIBODY (ROUTINE TESTING W REFLEX): HIV 1&2 Ab, 4th Generation: NONREACTIVE

## 2015-09-25 MED ORDER — NITROGLYCERIN 0.4 MG SL SUBL: 0.4000 mg | SUBLINGUAL_TABLET | SUBLINGUAL | Status: DC | PRN

## 2015-09-25 NOTE — Progress Notes (Signed)
HPI:  Pt presents to the clinic today for his Medicare Wellness Exam. He is also due to follow up chronic conditions.  Chronic back/neck pain:  He has has neck surgery x 4. He has not had a cervical xray since 2014. He describes the pain as a burning/tingling sensation. He is following with pain management. They have him on Oxycodone and Gabapentin.  HTN: He is taking HCTZ and Clonidine as prescribed. His BP today is 112/68. He denies headache, chest pain, chest tightness, SOB at this time. ECG from 05/2015 reviewed.  COPD: He has continued smoking and states that he is not interested in quitting at this time. He has had some SOB when he is up walking around but no limitations in activity. He uses the Albuterol inhaler 2 x days.  Insomnia: He is having trouble staying asleep.  He is taking Trazadone a few times per week.  GERD: He is not sure what his triggers are. He is taking Prilosec as directed. He is still having trouble swallowing, even water at times. He has noticed swelling in his throat. He has tried Zantac and Nexium in the past. He does not see an gastroenterologist.  HLD with CAD: His last LDL was 39. He takes Lipitor, Bisoprolol, Plavix, and Fish Oil as prescribed.  DM 2: Diet controlled. His last A1C was 6.3, 09/20/15%. His last eye exam was. Flu and Pneumovax UTD.  Past Medical History  Diagnosis Date  . Hypertension   . COPD (chronic obstructive pulmonary disease) ([place])   . Spinal disease   . Seasonal allergies   . Positive TB test   . Phlebitis   . Shortness of breath   . GERD (gastroesophageal reflux disease)   . Hyperlipidemia   . H/O blood clots     "had them in my back; put filter in before one of my neck ORs"  . Chronic bronchitis ([place])   . Arthritis     "back" (05/16/2015)  . Chronic back pain     "all my back"  . History of gout   . CAD in native artery, occluded Lcx and rotational atherectomy to LAD with DES 05/17/15 05/18/2015    Current Outpatient  Prescriptions  Medication Sig Dispense Refill  . albuterol (PROVENTIL HFA;VENTOLIN HFA) 108 (90 Base) MCG/ACT inhaler Inhale 2 puffs into the lungs every 6 (six) hours as needed for wheezing or shortness of breath.    [name] aspirin 81 MG chewable tablet Chew 1 tablet (81 mg total) by mouth daily.    [name] atorvastatin (LIPITOR) 80 MG tablet Take 1 tablet (80 mg total) by mouth daily at 6 PM. 90 tablet 3  . bisoprolol (ZEBETA) 5 MG tablet Take 0.5 tablets (2.5 mg total) by mouth daily. 90 tablet 1  . Blood Glucose Monitoring Suppl (ONETOUCH VERIO) w/Device KIT 1 Device by Does not apply route once. 1 kit 0  . cloNIDine (CATAPRES) 0.3 MG tablet Take 1 tablet by mouth two  times daily 180 tablet 0  . clopidogrel (PLAVIX) 75 MG tablet Take 1 tablet (75 mg total) by mouth daily. Take a total of 300 mg (4) tablets the first day. 94 tablet 3  . gabapentin (NEURONTIN) 800 MG tablet Take 1 tablet by mouth 3  times daily 270 tablet 0  . glucose blood (ONETOUCH VERIO) test strip 1 each by Other route 3 (three) times daily. Use as instructed 100 each 11  . hydrochlorothiazide (HYDRODIURIL) 25 MG tablet Take 1 tablet by mouth  daily 90  tablet 0  . levocetirizine (XYZAL) 5 MG tablet Take 1 tablet (5 mg total) by mouth every evening. 30 tablet 2  . nitroGLYCERIN (NITROSTAT) 0.4 MG SL tablet Place 1 tablet (0.4 mg total) under the tongue every 5 (five) minutes as needed for chest pain. 25 tablet 4  . Omega-3 Fatty Acids (FISH OIL) 1200 MG CAPS Take 1 capsule by mouth 3 (three) times daily.    [name] omeprazole (PRILOSEC) 20 MG capsule Take 1 capsule by mouth  daily 90 capsule 0  . ONE TOUCH LANCETS MISC 1 each by Does not apply route 3 (three) times daily. 200 each 5  . oxyCODONE-acetaminophen (PERCOCET) 7.5-325 MG tablet Take 1 tablet by mouth 3 (three) times daily as needed for moderate pain.     . traZODone (DESYREL) 50 MG tablet Take 1 tablet by mouth at  bedtime as needed for sleep 90 tablet 0  . [DISCONTINUED]  lisinopril (PRINIVIL,ZESTRIL) 40 MG tablet TAKE 1 TABLET (40 MG TOTAL) BY MOUTH DAILY. 90 tablet 1   Current Facility-Administered Medications  Medication Dose Route Frequency Provider Last Rate Last Dose  . levalbuterol (XOPENEX) nebulizer solution 0.63 mg  0.63 mg Nebulization Once Brett Canales, PA-C        Allergies  Allergen Reactions  . Lisinopril Anaphylaxis  . Contrast Media [Iodinated Diagnostic Agents] Hives and Itching    Pt can use if taking benadryl   . Ibuprofen Other (See Comments)    Internal bleeding    Family History  Problem Relation Age of Onset  . Stomach cancer Mother     Deceased, 34s  . Hypertension Mother   . Hypertension Father   . Stroke Father   . Diabetes Neg Hx   . Early death Neg Hx   . Heart attack Father     Deceased, 4  . Healthy Daughter     Social History   Social History  . Marital Status: Married    Spouse Name: N/A  . Number of Children: N/A  . Years of Education: 7   Occupational History  . Disabled    Social History Main Topics  . Smoking status: Current Every Day Smoker -- 0.25 packs/day for 47 years    Types: Cigarettes  . Smokeless tobacco: Never Used  . 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 Drug Use: No  . Sexual Activity: Not Currently   Other Topics Concern  . Not on file   Social History Narrative   Regular exercise-no   Caffeine Use-yes   Lives with wife in a one story home. Has 2 children.  On disability for low back pain.  Used to work as a Games developer, lasted worked in 2007.     Education: 7th grade.    Hospitiliaztions: 1  Health Maintenance:    Flu: 12/2014  Tetanus: 08/2012  Pneumovax: 02/2015  PSA: unsure  Colon Screening: ? 2011  Eye Doctor: never  Dental Exam: no, dentures   Providers:   PCP: Webb Silversmith, NP-C  Cardiologist: Dr. Marlou Porch  Vascular: Dr.  Oneida Alar  Neuorolgy: Dr. Posey Pronto  Neurosurgery: Dr. Hal Neer  I have personally reviewed and have noted:  1. The patient's medical and social history 2. Their use of alcohol, tobacco or illicit drugs 3. Their current medications and supplements 4. The patient's functional ability including ADL's, fall risks, home  safety risks and hearing or visual impairment. 5. Diet and physical activities 6. Evidence for depression or mood disorder  Subjective:   Review of Systems:   Constitutional: Denies fever, malaise, fatigue, headache or abrupt weight changes.  HEENT: Denies eye pain, eye redness, ear pain, ringing in the ears, wax buildup, runny nose, nasal congestion, bloody nose, or sore throat. Respiratory: Denies difficulty breathing, shortness of breath, cough or sputum production.   Cardiovascular: Denies chest pain, chest tightness, palpitations or swelling in the hands or feet.  Gastrointestinal: Denies abdominal pain, bloating, constipation, diarrhea or blood in the stool.  GU: Denies urgency, frequency, pain with urination, burning sensation, blood in urine, odor or discharge. Musculoskeletal: Denies decrease in range of motion, difficulty with gait, muscle pain or joint pain and swelling.  Skin: Denies redness, rashes, lesions or ulcercations.  Neurological: Denies dizziness, difficulty with memory, difficulty with speech or problems with balance and coordination.  Psych: Denies anxiety, depression, SI/HI.  No other specific complaints in a complete review of systems (except as listed in HPI above).  Objective:  PE:   BP 112/68 mmHg  Pulse 76  Temp(Src) 97.8 F (36.6 C) (Oral)  Ht '5\' 9"'$  (1.753 m)  Wt 226 lb 8 oz (102.74 kg)  BMI 33.43 kg/m2  SpO2 95%  Wt Readings from Last 3 Encounters:  08/10/15 233 lb 1.6 oz (105.733 kg)  08/10/15 232 lb 12.8 oz (105.597 kg)  08/07/15 230 lb (104.327 kg)    General: Appears their stated age, well developed, well nourished in  NAD. Skin: Warm, dry and intact. No rashes, lesions or ulcerations noted. HEENT: Head: normal shape and size; Eyes: sclera white, no icterus, conjunctiva pink, PERRLA and EOMs intact; Ears: Tm's gray and intact, normal light reflex; Throat/Mouth: Teeth present, mucosa pink and moist, no exudate, lesions or ulcerations noted.  Neck: Neck supple, trachea midline. No masses, lumps or thyromegaly present.  Cardiovascular: Normal rate and rhythm. S1,S2 noted.  No murmur, rubs or gallops noted. No JVD or BLE edema. No carotid bruits noted. Pulmonary/Chest: Normal effort and positive vesicular breath sounds. No respiratory distress. No wheezes, rales or ronchi noted.  Abdomen: Soft and nontender. Normal bowel sounds, no bruits noted. No distention or masses noted. Liver, spleen and kidneys non palpable. Musculoskeletal: Normal range of motion. Strength 5/5 BUE/BLE. No signs of joint swelling.  Neurological: Alert and oriented. Cranial nerves II-XII grossly intact. Coordination normal.  Psychiatric: Mood and affect normal. Behavior is normal. Judgment and thought content normal.   EKG:  BMET    Component Value Date/Time   NA 141 05/18/2015 0725   K 4.6 05/18/2015 0725   CL 104 05/18/2015 0725   CO2 25 05/18/2015 0725   GLUCOSE 196* 05/18/2015 0725   BUN 22* 05/18/2015 0725   CREATININE 1.23 05/18/2015 0725   CREATININE 0.96 05/15/2015 1017   CALCIUM 8.7* 05/18/2015 0725   GFRNONAA >60 05/18/2015 0725   GFRAA >60 05/18/2015 0725    Lipid Panel     Component Value Date/Time   CHOL 107* 07/10/2015 1228   TRIG 205* 07/10/2015 1228   HDL 27* 07/10/2015 1228   CHOLHDL 4.0 07/10/2015 1228   VLDL 41* 07/10/2015 1228   LDLCALC 39 07/10/2015 1228    CBC    Component Value Date/Time   WBC 27.0* 05/18/2015 0725   RBC 5.53 05/18/2015 0725   HGB 15.9 05/18/2015 0725   HCT 47.7 05/18/2015 0725   PLT 262 05/18/2015 0725   MCV 86.3 05/18/2015 0725  MCH 28.8 05/18/2015 0725   MCHC 33.3  05/18/2015 0725   RDW 16.3* 05/18/2015 0725   LYMPHSABS 3.1 05/15/2015 1017   MONOABS 1.2* 05/15/2015 1017   EOSABS 0.3 05/15/2015 1017   BASOSABS 0.1 05/15/2015 1017    Hgb A1C Lab Results  Component Value Date   HGBA1C 6.2 01/31/2015      Assessment and Plan:   Medicare Annual Wellness Visit:  Diet: He is only eating 1 meal per day. He is consuming a low fat, low salt diet. Physical activity: Sedentary Depression/mood screen: Negative Hearing: Intact to whispered voice Visual acuity: Grossly normal, he does wear reading glasses ADLs: Capable Fall risk: None Home safety: Good Cognitive evaluation: Intact to orientation, naming, recall and repetition EOL planning: No adv directives, full code/ I agree  Preventative Medicine:   Next appointment:

## 2015-09-25 NOTE — Assessment & Plan Note (Signed)
Complicated by difficulty swallowing Will refer to GI for further evaluation Encouraged weight loss Continue Prilosec

## 2015-09-25 NOTE — Patient Instructions (Signed)

## 2015-09-25 NOTE — Assessment & Plan Note (Signed)
S/p bypass Continue Plavix He will continue to follow with vascular Encouraged smoking cessation

## 2015-09-25 NOTE — Assessment & Plan Note (Signed)
A1C controlled with diet Taking Neurontin for neuropathy Handicap placard form filled out Microalbumin today Encouraged him to get a flu shot in the fall Pneumovax UTD Encouraged yearly eye exams Foot exam today

## 2015-09-25 NOTE — Assessment & Plan Note (Signed)
Discussed smoking cessation He really needs an ICS but he reports he can not afford it Continue Albuterol prn

## 2015-09-25 NOTE — Assessment & Plan Note (Signed)
Controlled on HCTZ and Clonidine Continue current regimen for now

## 2015-09-25 NOTE — Assessment & Plan Note (Signed)
Continue Trazadone prn

## 2015-09-25 NOTE — Assessment & Plan Note (Signed)
LDL at goal Continue Plavix, Lipitor, Bisoprolol and Fish Oil Encouraged him to consume a low fat diet Encouraged smoking cessation

## 2015-09-25 NOTE — Progress Notes (Signed)
Pre visit review using our clinic review tool, if applicable. No additional management support is needed unless otherwise documented below in the visit note. 

## 2015-09-25 NOTE — Assessment & Plan Note (Signed)
He will continue to follow with pain management

## 2015-09-25 NOTE — Assessment & Plan Note (Signed)
Encouraged him to quit 

## 2015-09-26 LAB — HEPATITIS C ANTIBODY: HCV Ab: NEGATIVE

## 2015-09-28 ENCOUNTER — Encounter: Payer: Self-pay | Admitting: Gastroenterology

## 2015-09-28 MED ORDER — LOSARTAN POTASSIUM 25 MG PO TABS: 12.5000 mg | ORAL_TABLET | Freq: Every day | ORAL | Status: DC

## 2015-09-28 NOTE — Addendum Note (Signed)
Addended by: Lurlean Nanny on: 09/28/2015 02:31 PM   Modules accepted: Orders

## 2015-10-10 DIAGNOSIS — G894 Chronic pain syndrome: Secondary | ICD-10-CM | POA: Diagnosis not present

## 2015-10-10 DIAGNOSIS — Z79891 Long term (current) use of opiate analgesic: Secondary | ICD-10-CM | POA: Diagnosis not present

## 2015-10-10 DIAGNOSIS — M5416 Radiculopathy, lumbar region: Secondary | ICD-10-CM | POA: Diagnosis not present

## 2015-10-10 DIAGNOSIS — M542 Cervicalgia: Secondary | ICD-10-CM | POA: Diagnosis not present

## 2015-10-11 ENCOUNTER — Ambulatory Visit (INDEPENDENT_AMBULATORY_CARE_PROVIDER_SITE_OTHER): Payer: Medicare Other | Admitting: Internal Medicine

## 2015-10-11 ENCOUNTER — Encounter: Payer: Self-pay | Admitting: Internal Medicine

## 2015-10-11 VITALS — BP 122/78 | HR 74 | Temp 98.3°F | Wt 225.8 lb

## 2015-10-11 DIAGNOSIS — M109 Gout, unspecified: Secondary | ICD-10-CM

## 2015-10-11 DIAGNOSIS — M1000 Idiopathic gout, unspecified site: Secondary | ICD-10-CM

## 2015-10-11 MED ORDER — COLCHICINE 0.6 MG PO TABS: ORAL_TABLET | ORAL | 0 refills | Status: DC

## 2015-10-11 NOTE — Patient Instructions (Signed)

## 2015-10-11 NOTE — Progress Notes (Signed)
Subjective:    Patient ID: Joshua [name], male    DOB: February 20, 1956, 60 y.o.   MRN: [id number]  HPI  Pt presents to the clinic today with a complaint of right great toe joint pain x 7 days.  He describes the pain as a constant aching at a severity of 10/10 that worsens with weightbearing and great toe movement and improves with sitting.  He reports it feels as though heat is radiating from the joint.  He admits to right ankle swelling 3 days ago after wearing closed toed shoes that has improved since wearing open shoes.  He denies fever, chills, or recent trauma.  He reports he recently started using a OTC topical pain relief cream for neuropathy 1 week ago prior to symptom onset.  He has tried Aspirin and Epsom salt soaks without relief.  He reports he has gout flares 1-2x/year, with the last episode occurring 1 year ago, and states the current symptoms feel the same as prior gout flares.   Review of Systems   Past Medical History:  Diagnosis Date  . Arthritis    "back" (05/16/2015)  . CAD in native artery, occluded Lcx and rotational atherectomy to LAD with DES 05/17/15 05/18/2015  . Chronic back pain    "all my back"  . Chronic bronchitis ([place])   . COPD (chronic obstructive pulmonary disease) ([place])   . GERD (gastroesophageal reflux disease)   . H/O blood clots    "had them in my back; put filter in before one of my neck ORs"  . History of gout   . Hyperlipidemia   . Hypertension   . Phlebitis   . Positive TB test   . Seasonal allergies   . Shortness of breath   . Spinal disease     Current Outpatient Prescriptions  Medication Sig Dispense Refill  . albuterol (PROVENTIL HFA;VENTOLIN HFA) 108 (90 Base) MCG/ACT inhaler Inhale 2 puffs into the lungs every 6 (six) hours as needed for wheezing or shortness of breath.    [name] aspirin 81 MG chewable tablet Chew 1 tablet (81 mg total) by mouth daily.    [name] atorvastatin (LIPITOR) 80 MG tablet Take 1 tablet (80 mg total) by mouth daily at 6  PM. 90 tablet 3  . bisoprolol (ZEBETA) 5 MG tablet Take 0.5 tablets (2.5 mg total) by mouth daily. 90 tablet 1  . Blood Glucose Monitoring Suppl (ONETOUCH VERIO) w/Device KIT 1 Device by Does not apply route once. 1 kit 0  . cloNIDine (CATAPRES) 0.3 MG tablet Take 1 tablet by mouth two  times daily 180 tablet 0  . clopidogrel (PLAVIX) 75 MG tablet Take 1 tablet (75 mg total) by mouth daily. Take a total of 300 mg (4) tablets the first day. 94 tablet 3  . gabapentin (NEURONTIN) 800 MG tablet Take 1 tablet by mouth 3  times daily 270 tablet 0  . glucose blood (ONETOUCH VERIO) test strip 1 each by Other route 3 (three) times daily. Use as instructed 100 each 11  . hydrochlorothiazide (HYDRODIURIL) 25 MG tablet Take 1 tablet by mouth  daily 90 tablet 0  . levocetirizine (XYZAL) 5 MG tablet Take 1 tablet (5 mg total) by mouth every evening. 30 tablet 2  . losartan (COZAAR) 25 MG tablet Take 0.5 tablets (12.5 mg total) by mouth daily. 45 tablet 1  . nitroGLYCERIN (NITROSTAT) 0.4 MG SL tablet Place 1 tablet (0.4 mg total) under the tongue every 5 (five) minutes as needed for  chest pain. 25 tablet 4  . Omega-3 Fatty Acids (FISH OIL) 1200 MG CAPS Take 1 capsule by mouth 3 (three) times daily.    [name] omeprazole (PRILOSEC) 20 MG capsule Take 1 capsule by mouth  daily 90 capsule 0  . ONE TOUCH LANCETS MISC 1 each by Does not apply route 3 (three) times daily. 200 each 5  . oxyCODONE-acetaminophen (PERCOCET) 7.5-325 MG tablet Take 1 tablet by mouth 3 (three) times daily as needed for moderate pain.     . traZODone (DESYREL) 50 MG tablet Take 1 tablet by mouth at  bedtime as needed for sleep 90 tablet 0  . colchicine 0.6 MG tablet Take 2 tablets now, then 1 tablet after 1 hour. Can take 1 tab BID thereafter. 30 tablet 0   Current Facility-Administered Medications  Medication Dose Route Frequency Provider Last Rate Last Dose  . levalbuterol (XOPENEX) nebulizer solution 0.63 mg  0.63 mg Nebulization Once Brett Canales, PA-C        Allergies  Allergen Reactions  . Lisinopril Anaphylaxis  . Contrast Media [Iodinated Diagnostic Agents] Hives and Itching    Pt can use if taking benadryl   . Ibuprofen Other (See Comments)    Internal bleeding    Family History  Problem Relation Age of Onset  . Stomach cancer Mother     Deceased, 75s  . Hypertension Mother   . Hypertension Father   . Stroke Father   . Heart attack Father     Deceased, 50  . Healthy Daughter   . Diabetes Neg Hx   . Early death Neg Hx     Social History   Social History  . Marital status: Married    Spouse name: N/A  . Number of children: N/A  . Years of education: 7   Occupational History  . Disabled Disabled   Social History Main Topics  . Smoking status: Current Every Day Smoker    Packs/day: 0.50    Years: 47.00    Types: Cigarettes  . Smokeless tobacco: Never Used  . 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 Drug use: No  . Sexual activity: Not Currently   Other Topics Concern  . Not on file   Social History Narrative   Regular exercise-no   Caffeine Use-yes   Lives with wife in a one story home. Has 2 children.  On disability for low back pain.  Used to work as a Games developer, lasted worked in 2007.     Education: 7th grade.     Const: Denies fever or chills. MSK: Pt reports swelling and pain in right foot.  No other specific complaints in a complete review of systems (except as listed in HPI above).       Objective:   Physical Exam  BP 122/78 (BP Location: Right Arm, Patient Position: Sitting, Cuff Size: Normal)   Pulse 74   Temp 98.3 F (36.8 C) (Oral)   Wt 225 lb 12 oz (102.4 kg)   BMI 33.34 kg/m   General: Appears stated age, in no acute distress. MSK: Swelling and slight erythema of right MTP joint, tender to palpation.  Limited ROM at right MTP joint and with right ankle inversion, full AROM  left foot.  Peripheral pulses 1+.      Assessment & Plan:   Acute gout flare of great toe:  Colchicine 1.82m  first dose, followed by 0.23m 1 hour later if needed, and then 0.655mBID until symptoms resolve Call if symptoms worsen or do not resolve  BAWebb SilversmithNP

## 2015-10-19 ENCOUNTER — Ambulatory Visit (INDEPENDENT_AMBULATORY_CARE_PROVIDER_SITE_OTHER): Payer: Medicare Other | Admitting: Gastroenterology

## 2015-10-19 ENCOUNTER — Encounter: Payer: Self-pay | Admitting: Gastroenterology

## 2015-10-19 VITALS — BP 130/80 | HR 74 | Ht 69.0 in | Wt 227.0 lb

## 2015-10-19 DIAGNOSIS — R1310 Dysphagia, unspecified: Secondary | ICD-10-CM

## 2015-10-19 NOTE — Patient Instructions (Signed)
You have been scheduled for a Barium Esophogram at [facility] (1st floor of the hospital) on Tuesday 10-24-2015 at 1:00 PM. Please arrive at 12:45 PM to your appointment for registration. Make certain not to have anything to eat or drink 3 hours prior to your test. If you need to reschedule for any reason, please contact radiology at [phone] to do so.  You can eat breakfast prior to 10:00 am that morning.  __________________________________________________________________ A barium swallow is an examination that concentrates on views of the esophagus. This tends to be a double contrast exam (barium and two liquids which, when combined, create a gas to distend the wall of the oesophagus) or single contrast (non-ionic iodine based). The study is usually tailored to your symptoms so a good history is essential. Attention is paid during the study to the form, structure and configuration of the esophagus, looking for functional disorders (such as aspiration, dysphagia, achalasia, motility and reflux) EXAMINATION You may be asked to change into a gown, depending on the type of swallow being performed. A radiologist and radiographer will perform the procedure. The radiologist will advise you of the type of contrast selected for your procedure and direct you during the exam. You will be asked to stand, sit or lie in several different positions and to hold a small amount of fluid in your mouth before being asked to swallow while the imaging is performed .In some instances you may be asked to swallow barium coated marshmallows to assess the motility of a solid food bolus. The exam can be recorded as a digital or video fluoroscopy procedure. POST PROCEDURE It will take 1-2 days for the barium to pass through your system. To facilitate this, it is important, unless otherwise directed, to increase your fluids for the next 24-48hrs and to resume your normal diet.  This test typically takes about 30 minutes  to perform. __________________________________________________________________________________

## 2015-10-19 NOTE — Progress Notes (Signed)
10/19/2015 Joshua [name] [id number] Aug 16, 1955   HISTORY OF PRESENT ILLNESS:  This is a 60 year old male who has been referred to our office by [name], NP, for evaluation of difficulty swallowing. He tells me that for close to a year he's been having problems swallowing certain foods. He says that some things tend to get hung up and take a long time to go down. Usually he drinks some liquids then it will eventually push the food down. He denies any dysphagia to liquids alone.  Unfortunately, he just underwent cardiac stent placements in March of this year and is now requiring Plavix and aspirin for one year from that time. He admits to intermittent acid reflux for which he takes omeprazole 20 mg only on an as-needed basis.  He reports some weight loss but says that this has been intentional.  He tells me that he had a colonoscopy about 7 years ago and was told that he did not need another one for 10 years. We will try to obtain his records from the outside facility.   Past Medical History:  Diagnosis Date  . Arthritis    "back" (05/16/2015)  . CAD in native artery, occluded Lcx and rotational atherectomy to LAD with DES 05/17/15 05/18/2015  . Chronic back pain    "all my back"  . Chronic bronchitis ([place])   . COPD (chronic obstructive pulmonary disease) ([place])   . GERD (gastroesophageal reflux disease)   . H/O blood clots    "had them in my back; put filter in before one of my neck ORs"  . History of gout   . Hyperlipidemia   . Hypertension   . Phlebitis   . Positive TB test   . Seasonal allergies   . Shortness of breath   . Spinal disease    Past Surgical History:  Procedure Laterality Date  . ANTERIOR CERVICAL DECOMP/DISCECTOMY FUSION  ~ 2001-2009 X 3  . ANTERIOR CERVICAL DECOMP/DISCECTOMY FUSION N/A 10/06/2012   Procedure: ANTERIOR CERVICAL DECOMPRESSION/DISCECTOMY FUSION 1 LEVEL;  Surgeon: [name], MD;  Location: [facility];  Service: Neurosurgery;   Laterality: N/A;  C7T1 anterior cervical decompression with fusion plating and bonegraft   . BACK SURGERY    . CARDIAC CATHETERIZATION  05/16/2015  . CARDIAC CATHETERIZATION N/A 05/16/2015   Procedure: Left Heart Cath and Coronary Angiography;  Surgeon: [name], MD;  Location: [facility];  Service: Cardiovascular;  Laterality: N/A;  . CARDIAC CATHETERIZATION N/A 05/16/2015   Procedure: Coronary Balloon Angioplasty;  Surgeon: [name], MD;  Location: [facility];  Service: Cardiovascular;  Laterality: N/A;  . CARDIAC CATHETERIZATION N/A 05/17/2015   Procedure: Coronary Stent Intervention Rotoblater;  Surgeon: [name], MD;  Location: [facility];  Service: Cardiovascular;  Laterality: N/A;  . ELBOW SURGERY Left 2016   "had bone pieces removed"  . EXCISIONAL HEMORRHOIDECTOMY    . FEMORAL BYPASS  ~ 2010  . IVC FILTER PLACEMENT ([place] HX)  2010   "had blood clots in my back"  . POSTERIOR LAMINECTOMY / DECOMPRESSION LUMBAR SPINE  2006   "bone spurs"    reports that he has been smoking Cigarettes.  He has a 23.50 pack-year smoking history. He has never used smokeless tobacco. He reports that he drinks alcohol. He reports that he does not use drugs. family history includes Healthy in his daughter; Heart attack in his father; Hypertension in his father and mother; Stomach cancer in  his mother; Stroke in his father. Allergies  Allergen Reactions  . Lisinopril Anaphylaxis  . Contrast Media [Iodinated Diagnostic Agents] Hives and Itching    Pt can use if taking benadryl   . Ibuprofen Other (See Comments)    Internal bleeding      Outpatient Encounter Prescriptions as of 10/19/2015  Medication Sig  . albuterol (PROVENTIL HFA;VENTOLIN HFA) 108 (90 Base) MCG/ACT inhaler Inhale 2 puffs into the lungs every 6 (six) hours as needed for wheezing or shortness of breath.  [name] aspirin 81 MG chewable tablet Chew 1 tablet (81 mg total) by mouth daily.  [name] atorvastatin  (LIPITOR) 80 MG tablet Take 1 tablet (80 mg total) by mouth daily at 6 PM.  . bisoprolol (ZEBETA) 5 MG tablet Take 0.5 tablets (2.5 mg total) by mouth daily.  . Blood Glucose Monitoring Suppl (ONETOUCH VERIO) w/Device KIT 1 Device by Does not apply route once.  . cloNIDine (CATAPRES) 0.3 MG tablet Take 1 tablet by mouth two  times daily  . clopidogrel (PLAVIX) 75 MG tablet Take 1 tablet (75 mg total) by mouth daily. Take a total of 300 mg (4) tablets the first day.  . colchicine 0.6 MG tablet Take 2 tablets now, then 1 tablet after 1 hour. Can take 1 tab BID thereafter.  . gabapentin (NEURONTIN) 800 MG tablet Take 1 tablet by mouth 3  times daily  . glucose blood (ONETOUCH VERIO) test strip 1 each by Other route 3 (three) times daily. Use as instructed  . hydrochlorothiazide (HYDRODIURIL) 25 MG tablet Take 1 tablet by mouth  daily  . levocetirizine (XYZAL) 5 MG tablet Take 1 tablet (5 mg total) by mouth every evening.  [name] losartan (COZAAR) 25 MG tablet Take 0.5 tablets (12.5 mg total) by mouth daily.  . nitroGLYCERIN (NITROSTAT) 0.4 MG SL tablet Place 1 tablet (0.4 mg total) under the tongue every 5 (five) minutes as needed for chest pain.  . Omega-3 Fatty Acids (FISH OIL) 1200 MG CAPS Take 1 capsule by mouth 3 (three) times daily.  [name] omeprazole (PRILOSEC) 20 MG capsule Take 1 capsule by mouth  daily  . ONE TOUCH LANCETS MISC 1 each by Does not apply route 3 (three) times daily.  [name] oxyCODONE-acetaminophen (PERCOCET) 7.5-325 MG tablet Take 1 tablet by mouth 3 (three) times daily as needed for moderate pain.   . traZODone (DESYREL) 50 MG tablet Take 1 tablet by mouth at  bedtime as needed for sleep   Facility-Administered Encounter Medications as of 10/19/2015  Medication  . levalbuterol (XOPENEX) nebulizer solution 0.63 mg     REVIEW OF SYSTEMS  : All other systems reviewed and negative except where noted in the History of Present Illness.   PHYSICAL EXAM: BP 130/80 (BP Location: Left Arm,  Patient Position: Sitting)   Pulse 74   Ht '5\' 9"'  (1.753 m)   Wt 227 lb (103 kg)   SpO2 99%   BMI 33.52 kg/m  General: Well developed white male in no acute distress Head: Normocephalic and atraumatic Eyes:  Sclerae anicteric, conjunctiva pink. Ears: Normal auditory acuity Lungs: Clear throughout to auscultation Heart: Regular rate and rhythm Abdomen: Soft, non-distended.  Normal bowel sounds.  Mild epigastric TTP. Musculoskeletal: Symmetrical with no gross deformities  Skin: No lesions on visible extremities Extremities: No edema  Neurological: Alert oriented x 4, grossly non-focal Psychological:  Alert and cooperative. Normal mood and affect  ASSESSMENT AND PLAN: -Dysphagia:  Mostly to solid food. Suspect that he has a peptic  stricture. Unfortunately due to his recent cardiac stent placement and one-year commitment to dual antiplatelet therapy we can likely not safely discontinue his Plavix at this time to perform EGD. I'm going to perform an esophagram to evaluate and be sure that there is no indication of something more worrisome but if it does simply show a stricture then I discussed with him dietary changes that he will need to follow until March or April next year when he can safely come off his Plavix for EGD and dilation. Discussed avoiding foods that are more offending, taking small bites, chewing food well, eating slowly etc.  *We will obtain his colonoscopy records from outside facility to determine when he will be due for that procedure as well.  CC:  Jearld Fenton, NP

## 2015-10-19 NOTE — Progress Notes (Signed)
Reviewed. Agree with plans for esophagram to rule out anything sinister. If benign-appearing stricture, eat very carefully until he can, off Plavix for therapeutic endoscopy

## 2015-10-24 ENCOUNTER — Ambulatory Visit (HOSPITAL_COMMUNITY): Payer: Medicare Other

## 2015-10-24 DIAGNOSIS — M545 Low back pain: Secondary | ICD-10-CM | POA: Diagnosis not present

## 2015-10-24 DIAGNOSIS — M542 Cervicalgia: Secondary | ICD-10-CM | POA: Diagnosis not present

## 2015-10-24 DIAGNOSIS — Z79891 Long term (current) use of opiate analgesic: Secondary | ICD-10-CM | POA: Diagnosis not present

## 2015-10-24 DIAGNOSIS — M5416 Radiculopathy, lumbar region: Secondary | ICD-10-CM | POA: Diagnosis not present

## 2015-10-24 DIAGNOSIS — G894 Chronic pain syndrome: Secondary | ICD-10-CM | POA: Diagnosis not present

## 2015-10-25 ENCOUNTER — Other Ambulatory Visit: Payer: Self-pay

## 2015-10-25 DIAGNOSIS — R1310 Dysphagia, unspecified: Secondary | ICD-10-CM

## 2015-10-31 ENCOUNTER — Ambulatory Visit (HOSPITAL_COMMUNITY)
Admission: RE | Admit: 2015-10-31 | Discharge: 2015-10-31 | Disposition: A | Discharge status: Home / Self Care | Payer: Medicare Other | Source: Ambulatory Visit | Attending: Gastroenterology | Admitting: Gastroenterology

## 2015-10-31 ENCOUNTER — Ambulatory Visit (HOSPITAL_COMMUNITY): Payer: Medicare Other

## 2015-10-31 DIAGNOSIS — R1310 Dysphagia, unspecified: Secondary | ICD-10-CM | POA: Diagnosis not present

## 2015-10-31 DIAGNOSIS — R0989 Other specified symptoms and signs involving the circulatory and respiratory systems: Secondary | ICD-10-CM | POA: Diagnosis not present

## 2015-11-20 ENCOUNTER — Other Ambulatory Visit: Payer: Self-pay | Admitting: Internal Medicine

## 2015-11-21 DIAGNOSIS — Z79891 Long term (current) use of opiate analgesic: Secondary | ICD-10-CM | POA: Diagnosis not present

## 2015-11-21 DIAGNOSIS — G894 Chronic pain syndrome: Secondary | ICD-10-CM | POA: Diagnosis not present

## 2015-11-21 DIAGNOSIS — G541 Lumbosacral plexus disorders: Secondary | ICD-10-CM | POA: Diagnosis not present

## 2015-11-21 DIAGNOSIS — G603 Idiopathic progressive neuropathy: Secondary | ICD-10-CM | POA: Diagnosis not present

## 2015-12-18 DIAGNOSIS — M542 Cervicalgia: Secondary | ICD-10-CM | POA: Diagnosis not present

## 2015-12-18 DIAGNOSIS — M545 Low back pain: Secondary | ICD-10-CM | POA: Diagnosis not present

## 2015-12-18 DIAGNOSIS — M79672 Pain in left foot: Secondary | ICD-10-CM | POA: Diagnosis not present

## 2015-12-18 DIAGNOSIS — M79671 Pain in right foot: Secondary | ICD-10-CM | POA: Diagnosis not present

## 2015-12-18 DIAGNOSIS — Z79891 Long term (current) use of opiate analgesic: Secondary | ICD-10-CM | POA: Diagnosis not present

## 2015-12-18 DIAGNOSIS — G894 Chronic pain syndrome: Secondary | ICD-10-CM | POA: Diagnosis not present

## 2015-12-27 ENCOUNTER — Other Ambulatory Visit: Payer: Self-pay | Admitting: Internal Medicine

## 2016-01-20 DIAGNOSIS — G894 Chronic pain syndrome: Secondary | ICD-10-CM | POA: Diagnosis not present

## 2016-01-20 DIAGNOSIS — M79672 Pain in left foot: Secondary | ICD-10-CM | POA: Diagnosis not present

## 2016-01-20 DIAGNOSIS — G629 Polyneuropathy, unspecified: Secondary | ICD-10-CM | POA: Diagnosis not present

## 2016-01-20 DIAGNOSIS — M545 Low back pain: Secondary | ICD-10-CM | POA: Diagnosis not present

## 2016-01-20 DIAGNOSIS — Z79891 Long term (current) use of opiate analgesic: Secondary | ICD-10-CM | POA: Diagnosis not present

## 2016-01-20 DIAGNOSIS — M79671 Pain in right foot: Secondary | ICD-10-CM | POA: Diagnosis not present

## 2016-02-06 ENCOUNTER — Encounter: Payer: Self-pay | Admitting: Cardiology

## 2016-02-06 ENCOUNTER — Ambulatory Visit (INDEPENDENT_AMBULATORY_CARE_PROVIDER_SITE_OTHER): Payer: Medicare Other | Admitting: Cardiology

## 2016-02-06 ENCOUNTER — Encounter (INDEPENDENT_AMBULATORY_CARE_PROVIDER_SITE_OTHER): Payer: Self-pay

## 2016-02-06 VITALS — BP 130/82 | HR 68 | Ht 69.0 in | Wt 225.6 lb

## 2016-02-06 DIAGNOSIS — I739 Peripheral vascular disease, unspecified: Secondary | ICD-10-CM

## 2016-02-06 DIAGNOSIS — I2510 Atherosclerotic heart disease of native coronary artery without angina pectoris: Secondary | ICD-10-CM | POA: Diagnosis not present

## 2016-02-06 DIAGNOSIS — I2583 Coronary atherosclerosis due to lipid rich plaque: Secondary | ICD-10-CM

## 2016-02-06 DIAGNOSIS — Z720 Tobacco use: Secondary | ICD-10-CM | POA: Diagnosis not present

## 2016-02-06 MED ORDER — BISOPROLOL FUMARATE 5 MG PO TABS: 2.5000 mg | ORAL_TABLET | Freq: Every day | ORAL | 3 refills | Status: DC

## 2016-02-06 MED ORDER — CLOPIDOGREL BISULFATE 75 MG PO TABS: 75.0000 mg | ORAL_TABLET | Freq: Every day | ORAL | 3 refills | Status: DC

## 2016-02-06 MED ORDER — ATORVASTATIN CALCIUM 80 MG PO TABS: 80.0000 mg | ORAL_TABLET | Freq: Every day | ORAL | 3 refills | Status: DC

## 2016-02-06 MED ORDER — LOSARTAN POTASSIUM 25 MG PO TABS: 12.5000 mg | ORAL_TABLET | Freq: Every day | ORAL | 3 refills | Status: DC

## 2016-02-06 MED ORDER — CLONIDINE HCL 0.3 MG PO TABS: 0.3000 mg | ORAL_TABLET | Freq: Two times a day (BID) | ORAL | 3 refills | Status: DC

## 2016-02-06 MED ORDER — HYDROCHLOROTHIAZIDE 25 MG PO TABS: 25.0000 mg | ORAL_TABLET | Freq: Every day | ORAL | 3 refills | Status: DC

## 2016-02-06 NOTE — Progress Notes (Signed)
Cardiology Office Note    Date:  02/06/2016   ID:  Joshua [name], DOB 1956/01/02, MRN [id number]  PCP:  Webb Silversmith, NP  Cardiologist:   Candee Furbish, MD     History of Present Illness:  Joshua [name] is a 60 y.o. male here for follow up CAD post rotational athrectomy of LAD 05/18/15, Dr. Royston Bake, following abnormal stress test, mild chest pain. Originally came to see me for safety of long-term pain management at the request of Dr. Garnette Gunner. In review of office notes, he went to a pain management clinic on 04/20/15 and had diagnostic studies done there and he was told that "heart will explode "and they did not prescribe any pain medication unless he had a letter from his primary care physician saying it is okay to proceed with pain medication. He underwent autonomic balance analysis. 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 Clinical protection of the heart is recommended for example beta blockers, ace inhibitors, ARB or calcium channel blockers and additional cardiovascular workouts. Symptoms such as on the static hypotension, heat intolerance, vagal dystonia. This may be a sign of mental fatigue, chronic stress and possibly presence of Chronic health condition affecting. Subhepatic nervous system. Deep breathing showed normal parasympathetic response to stimulation. Valsalva showed hyperactive sympathetic response to stimulation and could be a cause for potential hypertension. Standing indicated parasympathetic surge. Heart rate upon standing is considered low.  [name], PVD, Fem Pop. Dr. Florene Glen. Has abdominal scar. Has bluish discoloration of his feet for many years. Saw Dr. Fletcher [name]. His aortobifemoral bypass was patent. He has 2 vessel runoff below the knee. The discoloration in his feet is likely multifactorial with an element of stasis dermatitis and possible small vessel disease.  He also has noted some  symptoms which he is attributing to the Brilinta, shortness of breath at rest that comes out of nowhere. He was requesting a switch to Plavix. Does have wheeze occasionally from smoking.  In review of PCP note: He went to the ER for evaluation of a dislodged IVC filter seen on recent CT scan done by [facility]. The radiologist called and told him that one of the prongs was going into the graft of his right iliac artery.  IMPRESSION:   Inferior vena caval filter (2010) in position with the apex just below the level of the renal veins. The inferior aspect of the legs protrude slightly beyond the margin of the cava which is not uncommonly seen. No adjacent hemorrhage, edema, or other acute abnormalities evident. There is an overview report on 07/13/15 (outside film read by radiologist in [place]) noted as IVC filter perforation extending into iliac.   He has the filter placed prior to a back surgery because he had "blood clots on his spine". Was referred to vascular surgery, Dr. Donnetta Hutching.    02/06/16-he is having no anginal symptoms. He continues to smoke. He continues to have leg pain. Office notes reviewed. He had not been taking his cardiac medications other than Plavix.   Past Medical History:  Diagnosis Date  . Arthritis    "back" (05/16/2015)  . CAD in native artery, occluded Lcx and rotational atherectomy to LAD with DES 05/17/15 05/18/2015  . Chronic back pain    "all my back"  . Chronic bronchitis ([place])   . COPD (chronic obstructive pulmonary disease) ([place])   . GERD (gastroesophageal reflux disease)   . H/O blood clots    "had them  in my back; put filter in before one of my neck ORs"  . History of gout   . Hyperlipidemia   . Hypertension   . Phlebitis   . Positive TB test   . Seasonal allergies   . Shortness of breath   . Spinal disease     Past Surgical History:  Procedure Laterality Date  . ANTERIOR CERVICAL DECOMP/DISCECTOMY FUSION  ~ 2001-2009 X 3  . ANTERIOR CERVICAL  DECOMP/DISCECTOMY FUSION N/A 10/06/2012   Procedure: ANTERIOR CERVICAL DECOMPRESSION/DISCECTOMY FUSION 1 LEVEL;  Surgeon: [name], MD;  Location: [facility];  Service: Neurosurgery;  Laterality: N/A;  C7T1 anterior cervical decompression with fusion plating and bonegraft   . BACK SURGERY    . CARDIAC CATHETERIZATION  05/16/2015  . CARDIAC CATHETERIZATION N/A 05/16/2015   Procedure: Left Heart Cath and Coronary Angiography;  Surgeon: [name], MD;  Location: [facility];  Service: Cardiovascular;  Laterality: N/A;  . CARDIAC CATHETERIZATION N/A 05/16/2015   Procedure: Coronary Balloon Angioplasty;  Surgeon: [name], MD;  Location: [facility];  Service: Cardiovascular;  Laterality: N/A;  . CARDIAC CATHETERIZATION N/A 05/17/2015   Procedure: Coronary Stent Intervention Rotoblater;  Surgeon: [name], MD;  Location: [facility];  Service: Cardiovascular;  Laterality: N/A;  . ELBOW SURGERY Left 2016   "had bone pieces removed"  . EXCISIONAL HEMORRHOIDECTOMY    . FEMORAL BYPASS  ~ 2010  . IVC FILTER PLACEMENT ([facility] HX)  2010   "had blood clots in my back"  . POSTERIOR LAMINECTOMY / DECOMPRESSION LUMBAR SPINE  2006   "bone spurs"    Outpatient Medications Prior to Visit  Medication Sig Dispense Refill  . albuterol (PROVENTIL HFA;VENTOLIN HFA) 108 (90 Base) MCG/ACT inhaler Inhale 2 puffs into the lungs every 6 (six) hours as needed for wheezing or shortness of breath.    [name] aspirin 81 MG chewable tablet Chew 1 tablet (81 mg total) by mouth daily.    . Blood Glucose Monitoring Suppl (ONETOUCH VERIO) w/Device KIT 1 Device by Does not apply route once. 1 kit 0  . colchicine 0.6 MG tablet Take 2 tablets now, then 1 tablet after 1 hour. Can take 1 tab BID thereafter. 30 tablet 0  . gabapentin (NEURONTIN) 800 MG tablet Take 1 tablet (800 mg total) by mouth 3 (three) times daily. PLEASE SCHEDULE FOLLOW UP VISIT January 2018 270 tablet 1  . glucose blood  (ONETOUCH VERIO) test strip 1 each by Other route 3 (three) times daily. Use as instructed 100 each 11  . levocetirizine (XYZAL) 5 MG tablet Take 1 tablet (5 mg total) by mouth every evening. 30 tablet 2  . nitroGLYCERIN (NITROSTAT) 0.4 MG SL tablet Place 1 tablet (0.4 mg total) under the tongue every 5 (five) minutes as needed for chest pain. 25 tablet 4  . Omega-3 Fatty Acids (FISH OIL) 1200 MG CAPS Take 1 capsule by mouth 3 (three) times daily.    [name] omeprazole (PRILOSEC) 20 MG capsule Take 1 capsule (20 mg total) by mouth daily. PLEASE SCHEDULE FOLLOW UP VISIT January 2018 90 capsule 1  . ONE TOUCH LANCETS MISC 1 each by Does not apply route 3 (three) times daily. 200 each 5  . oxyCODONE-acetaminophen (PERCOCET) 7.5-325 MG tablet Take 1 tablet by mouth 3 (three) times daily as needed for moderate pain.     . traZODone (DESYREL) 50 MG tablet Take 1 tablet (50 mg total) by mouth at bedtime as  needed. PLEASE SCHEDULE FOLLOW UP VISIT January 2018 90 tablet 1  . atorvastatin (LIPITOR) 80 MG tablet Take 1 tablet (80 mg total) by mouth daily at 6 PM. 90 tablet 3  . bisoprolol (ZEBETA) 5 MG tablet Take 0.5 tablets (2.5 mg total) by mouth daily. 90 tablet 1  . cloNIDine (CATAPRES) 0.3 MG tablet Take 1 tablet (0.3 mg total) by mouth 2 (two) times daily. PLEASE SCHEDULE FOLLOW UP VISIT January 2018 180 tablet 1  . clopidogrel (PLAVIX) 75 MG tablet Take 1 tablet (75 mg total) by mouth daily. Take a total of 300 mg (4) tablets the first day. 94 tablet 3  . hydrochlorothiazide (HYDRODIURIL) 25 MG tablet Take 1 tablet (25 mg total) by mouth daily. PLEASE SCHEDULE FOLLOW UP VISIT January 2018 90 tablet 1  . losartan (COZAAR) 25 MG tablet TAKE ONE-HALF TABLET BY  MOUTH DAILY 45 tablet 1   Facility-Administered Medications Prior to Visit  Medication Dose Route Frequency Provider Last Rate Last Dose  . levalbuterol (XOPENEX) nebulizer solution 0.63 mg  0.63 mg Nebulization Once Brett Canales, PA-C          Allergies:   Lisinopril; Contrast media [iodinated diagnostic agents]; and Ibuprofen   Social History   Social History  . Marital status: Married    Spouse name: N/A  . Number of children: N/A  . Years of education: 7   Occupational History  . Disabled Disabled   Social History Main Topics  . Smoking status: Current Every Day Smoker    Packs/day: 0.50    Years: 47.00    Types: Cigarettes  . Smokeless tobacco: Never Used  . 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 Drug use: No  . Sexual activity: Not Currently   Other Topics Concern  . None   Social History Narrative   Regular exercise-no   Caffeine Use-yes   Lives with wife in a one story home. Has 2 children.  On disability for low back pain.  Used to work as a Games developer, lasted worked in 2007.     Education: 7th grade.     Family History:  The patient's family history includes Healthy in his daughter; Heart attack in his father; Hypertension in his father and mother; Stomach cancer in his mother; Stroke in his father.   ROS:   Please see the history of present illness.    ROS back pain, muscle pain, leg swelling, shortness of breath with activity, wheezing, snoring, leg pain All other systems reviewed and are negative.   PHYSICAL EXAM:   VS:  BP 130/82   Pulse 68   Ht '5\' 9"'$  (1.753 m)   Wt 225 lb 9.6 oz (102.3 kg)   BMI 33.32 kg/m    GEN: Well nourished, well developed, in no acute distress  HEENT: normal  Neck: no JVD, carotid bruits, or masses Cardiac: RRR; no murmurs, rubs, or gallops,no edema  Respiratory:  Mild wheezes heard bilateral lower lobes again, normal respiratory effort.  GI: soft, nontender, nondistended, + BS, abdominal scar noted MS: no deformity or atrophy  Skin: warm and dry, no rash, diminished distal pulses Neuro:  Alert and Oriented x 3, Strength and sensation are intact Psych: euthymic mood,  full affect  Wt Readings from Last 3 Encounters:  02/06/16 225 lb 9.6 oz (102.3 kg)  10/19/15 227 lb (103 kg)  10/11/15 225 lb  12 oz (102.4 kg)      Studies/Labs Reviewed:   EKG:   05/08/15-sinus rhythm, 68, nonspecific ST-T wave changes  Recent Labs: 05/18/2015: BUN 22; Creatinine, Ser 1.23; Hemoglobin 15.9; Platelets 262; Potassium 4.6; Sodium 141 07/10/2015: ALT 18   Lipid Panel    Component Value Date/Time   CHOL 107 (L) 07/10/2015 1228   TRIG 205 (H) 07/10/2015 1228   HDL 27 (L) 07/10/2015 1228   CHOLHDL 4.0 07/10/2015 1228   VLDL 41 (H) 07/10/2015 1228   LDLCALC 39 07/10/2015 1228   LDLDIRECT 107.0 04/21/2015 1352    Additional studies/ records that were reviewed today include:  Prior office records reviewed, lab work reviewed, EKGs reviewed, autonomic study reviewed    ASSESSMENT:    1. PAD (peripheral artery disease) ([place])   2. Tobacco use   3. Coronary artery disease due to lipid rich plaque      PLAN:  In order of problems listed above:  1. Coronary artery disease-post Dr. Royston Bake rotational atherectomy and drug-eluting stent placement to the mid LAD. Mild chest pressure prior to stent placement noted when exerting himself. With his extensive peripheral vascular disease history, smoking history we will continue with aggressive secondary prevention. Continue with dual antiplatelet therapy for at least one year. Could continue aspirin and Plavix lifelong because of his peripheral vascular disease.  2. Peripheral vascular disease-suffers with neuropathy as well as. Gabapentin. Smoking cessation. Exercise. He and his wife are going to the gym. Saw Dr. Fletcher [name],  [place] aortobifemoral bypass in 2010 at [facility]. His aortobifemoral bypass was patent. He has 2 vessel runoff below the knee. The discoloration in his feet is likely multifactorial with an element of stasis dermatitis and possible small vessel disease.I will set him up with a return visit with Dr.  Fletcher [name]. 3. Tobacco use-encouraged cessation. This is very important for him. He is down to a quarter of a pack.Expressed the increased incidence of amputation with smoking. 4. IVC filter - has seen Dr. Donnetta Hutching in the past but missed prior appts.  Prong extension outside of IVC. 5. Essential hypertension-we will refill his cardiac medications for him. 6. Diabetic neuropathy as above. Controlled diabetes. Webb Silversmith, NP is his primary, working hard with him. 7. Chronic pain control-has had cervical spine surgery, back pain, leg pain, foot pain. 8. Dysphagia - he will need to wait until February 2018 prior to procedure.    Medication Adjustments/Labs and Tests Ordered: Current medicines are reviewed at length with the patient today.  Concerns regarding medicines are outlined above.  Medication changes, Labs and Tests ordered today are listed in the Patient Instructions below. Patient Instructions  Medication Instructions:  The current medical regimen is effective;  continue present plan and medications.  Follow-Up: Please schedule your follow up appointment with Dr Fletcher [name] for PVD.  Follow up in 6 months with Bonney Leitz, PA.  You will receive a letter in the mail 2 months before you are due.  Please call us when you receive this letter to schedule your follow up appointment.  Follow up in 1 year with Dr. Marlou Porch.  You will receive a letter in the mail 2 months before you are due.  Please call us when you receive this letter to schedule your follow up appointment.  Thank you for choosing [facility]!!            Signed, Candee Furbish, MD  02/06/2016 9:50 AM    [address],  [place], [place]  [address] Phone: [phone]; Fax: [phone]

## 2016-02-06 NOTE — Patient Instructions (Addendum)
Medication Instructions:  The current medical regimen is effective;  continue present plan and medications.  Follow-Up: Please schedule your follow up appointment with Dr Fletcher [name] for PVD.  Follow up in 6 months with Bonney Leitz, PA.  You will receive a letter in the mail 2 months before you are due.  Please call us when you receive this letter to schedule your follow up appointment.  Follow up in 1 year with Dr. Marlou Porch.  You will receive a letter in the mail 2 months before you are due.  Please call us when you receive this letter to schedule your follow up appointment.  Thank you for choosing [facility]!!

## 2016-02-13 ENCOUNTER — Encounter: Payer: Self-pay | Admitting: Cardiovascular Disease

## 2016-02-13 ENCOUNTER — Ambulatory Visit (INDEPENDENT_AMBULATORY_CARE_PROVIDER_SITE_OTHER): Payer: Medicare Other | Admitting: Cardiovascular Disease

## 2016-02-13 DIAGNOSIS — I2510 Atherosclerotic heart disease of native coronary artery without angina pectoris: Secondary | ICD-10-CM | POA: Diagnosis not present

## 2016-02-13 DIAGNOSIS — Z720 Tobacco use: Secondary | ICD-10-CM

## 2016-02-13 DIAGNOSIS — I739 Peripheral vascular disease, unspecified: Secondary | ICD-10-CM

## 2016-02-13 DIAGNOSIS — E784 Other hyperlipidemia: Secondary | ICD-10-CM

## 2016-02-13 DIAGNOSIS — E7849 Other hyperlipidemia: Secondary | ICD-10-CM

## 2016-02-13 DIAGNOSIS — E785 Hyperlipidemia, unspecified: Secondary | ICD-10-CM | POA: Diagnosis not present

## 2016-02-13 LAB — CBC
HCT: 50.3 % — ABNORMAL HIGH (ref 38.5–50.0)
HEMOGLOBIN: 17.1 g/dL (ref 13.2–17.1)
MCH: 29.8 pg (ref 27.0–33.0)
MCHC: 34 g/dL (ref 32.0–36.0)
MCV: 87.8 fL (ref 80.0–100.0)
MPV: 10.6 fL (ref 7.5–12.5)
Platelets: 253 10*3/uL (ref 140–400)
RBC: 5.73 MIL/uL (ref 4.20–5.80)
RDW: 15 % (ref 11.0–15.0)
WBC: 10.1 10*3/uL (ref 3.8–10.8)

## 2016-02-13 LAB — BASIC METABOLIC PANEL
BUN: 14 mg/dL (ref 7–25)
CALCIUM: 9.5 mg/dL (ref 8.6–10.3)
CO2: 29 mmol/L (ref 20–31)
Chloride: 100 mmol/L (ref 98–110)
Creat: 1.01 mg/dL (ref 0.70–1.33)
GLUCOSE: 109 mg/dL — AB (ref 65–99)
Potassium: 4.6 mmol/L (ref 3.5–5.3)
SODIUM: 138 mmol/L (ref 135–146)

## 2016-02-13 LAB — PROTIME-INR
INR: 1
PROTHROMBIN TIME: 10.3 s (ref 9.0–11.5)

## 2016-02-13 MED ORDER — PREDNISONE 20 MG PO TABS: ORAL_TABLET | ORAL | 0 refills | Status: DC

## 2016-02-13 NOTE — Progress Notes (Signed)
Cardiology Office Note   Date:  02/13/2016   ID:  [name], DOB 11-24-1955, MRN [id number]  PCP:  Webb Silversmith, NP  Cardiologist:  Dr. Marlou Porch  Chief Complaint  Patient presents with  . Follow-up    pt c/o weight gain , dizziness      History of Present Illness: Joshua [name] is a 60 y.o. male who is here today for a follow up visit regarding peripheral arterial disease. He has known history of coronary artery disease status post rotational atherectomy and drug-eluting stent placement to the mid LAD. He has known history of peripheral arterial disease status post aortobifemoral bypass in 2010 at [place], diabetes mellitus with diabetic neuropathy, hyperlipidemia and tobacco use. He was seen 6 months ago for foot burning sensation and throbbing with chronic bluish discoloration of his feet for many years.  Noninvasive vascular evaluation in March  showed normal ABI bilaterally, patent aorto bifemoral bypass and moderate left SFA stenosis.  He reports worsening bilateral calf pain with walking since last visit. This is now happening after walking 50 feet and forces him to stop and rest for few minutes before he can resume. The symptoms are worse on the left side than the right side. He also describes a lot of numbness in his feet mostly at night. He has been on gabapentin 4. With no significant relief.   Past Medical History:  Diagnosis Date  . Arthritis    "back" (05/16/2015)  . CAD in native artery, occluded Lcx and rotational atherectomy to LAD with DES 05/17/15 05/18/2015  . Chronic back pain    "all my back"  . Chronic bronchitis ([place])   . COPD (chronic obstructive pulmonary disease) ([place])   . GERD (gastroesophageal reflux disease)   . H/O blood clots    "had them in my back; put filter in before one of my neck ORs"  . History of gout   . Hyperlipidemia   . Hypertension   . Phlebitis   . Positive TB test   . Seasonal allergies   . Shortness of breath   .  Spinal disease     Past Surgical History:  Procedure Laterality Date  . ANTERIOR CERVICAL DECOMP/DISCECTOMY FUSION  ~ 2001-2009 X 3  . ANTERIOR CERVICAL DECOMP/DISCECTOMY FUSION N/A 10/06/2012   Procedure: ANTERIOR CERVICAL DECOMPRESSION/DISCECTOMY FUSION 1 LEVEL;  Surgeon: [name], MD;  Location: [facility];  Service: Neurosurgery;  Laterality: N/A;  C7T1 anterior cervical decompression with fusion plating and bonegraft   . BACK SURGERY    . CARDIAC CATHETERIZATION  05/16/2015  . CARDIAC CATHETERIZATION N/A 05/16/2015   Procedure: Left Heart Cath and Coronary Angiography;  Surgeon: [name], MD;  Location: [facility];  Service: Cardiovascular;  Laterality: N/A;  . CARDIAC CATHETERIZATION N/A 05/16/2015   Procedure: Coronary Balloon Angioplasty;  Surgeon: [name], MD;  Location: [facility];  Service: Cardiovascular;  Laterality: N/A;  . CARDIAC CATHETERIZATION N/A 05/17/2015   Procedure: Coronary Stent Intervention Rotoblater;  Surgeon: [name], MD;  Location: [facility];  Service: Cardiovascular;  Laterality: N/A;  . ELBOW SURGERY Left 2016   "had bone pieces removed"  . EXCISIONAL HEMORRHOIDECTOMY    . FEMORAL BYPASS  ~ 2010  . IVC FILTER PLACEMENT ([place] HX)  2010   "had blood clots in my back"  . POSTERIOR LAMINECTOMY / DECOMPRESSION LUMBAR SPINE  2006   "bone spurs"     Current Outpatient  Prescriptions  Medication Sig Dispense Refill  . albuterol (PROVENTIL HFA;VENTOLIN HFA) 108 (90 Base) MCG/ACT inhaler Inhale 2 puffs into the lungs every 6 (six) hours as needed for wheezing or shortness of breath.    . aspirin 81 MG chewable tablet Chew 1 tablet (81 mg total) by mouth daily.    . atorvastatin (LIPITOR) 80 MG tablet Take 1 tablet (80 mg total) by mouth daily at 6 PM. 90 tablet 3  . bisoprolol (ZEBETA) 5 MG tablet Take 0.5 tablets (2.5 mg total) by mouth daily. 45 tablet 3  . Blood Glucose Monitoring Suppl (ONETOUCH VERIO)  w/Device KIT 1 Device by Does not apply route once. 1 kit 0  . cloNIDine (CATAPRES) 0.3 MG tablet Take 1 tablet (0.3 mg total) by mouth 2 (two) times daily. 180 tablet 3  . clopidogrel (PLAVIX) 75 MG tablet Take 1 tablet (75 mg total) by mouth daily. 94 tablet 3  . colchicine 0.6 MG tablet Take 2 tablets now, then 1 tablet after 1 hour. Can take 1 tab BID thereafter. 30 tablet 0  . gabapentin (NEURONTIN) 800 MG tablet Take 1 tablet (800 mg total) by mouth 3 (three) times daily. PLEASE SCHEDULE FOLLOW UP VISIT January 2018 270 tablet 1  . glucose blood (ONETOUCH VERIO) test strip 1 each by Other route 3 (three) times daily. Use as instructed 100 each 11  . hydrochlorothiazide (HYDRODIURIL) 25 MG tablet Take 1 tablet (25 mg total) by mouth daily. 90 tablet 3  . levocetirizine (XYZAL) 5 MG tablet Take 1 tablet (5 mg total) by mouth every evening. 30 tablet 2  . losartan (COZAAR) 25 MG tablet Take 0.5 tablets (12.5 mg total) by mouth daily. 45 tablet 3  . nitroGLYCERIN (NITROSTAT) 0.4 MG SL tablet Place 1 tablet (0.4 mg total) under the tongue every 5 (five) minutes as needed for chest pain. 25 tablet 4  . Omega-3 Fatty Acids (FISH OIL) 1200 MG CAPS Take 1 capsule by mouth 3 (three) times daily.    . omeprazole (PRILOSEC) 20 MG capsule Take 1 capsule (20 mg total) by mouth daily. PLEASE SCHEDULE FOLLOW UP VISIT January 2018 90 capsule 1  . ONE TOUCH LANCETS MISC 1 each by Does not apply route 3 (three) times daily. 200 each 5  . oxyCODONE-acetaminophen (PERCOCET) 7.5-325 MG tablet Take 1 tablet by mouth 3 (three) times daily as needed for moderate pain.     . traZODone (DESYREL) 50 MG tablet Take 1 tablet (50 mg total) by mouth at bedtime as needed. PLEASE SCHEDULE FOLLOW UP VISIT January 2018 90 tablet 1   Current Facility-Administered Medications  Medication Dose Route Frequency Provider Last Rate Last Dose  . levalbuterol (XOPENEX) nebulizer solution 0.63 mg  0.63 mg Nebulization Once Bryan W Hager,  PA-C        Allergies:   Lisinopril; Contrast media [iodinated diagnostic agents]; and Ibuprofen    Social History:  The patient  reports that he has been smoking Cigarettes.  He has a 23.50 pack-year smoking history. He has never used smokeless tobacco. He reports that he drinks alcohol. He reports that he does not use drugs.   Family History:  The patient's family history includes Healthy in his daughter; Heart attack in his father; Hypertension in his father and mother; Stomach cancer in his mother; Stroke in his father.    ROS:  Please see the history of present illness.   Otherwise, review of systems are positive for none.   All other systems are   reviewed and negative.    PHYSICAL EXAM: VS:  BP 102/70 (BP Location: Right Arm, Patient Position: Sitting, Cuff Size: Normal)   Pulse 63   Ht 5' 9" (1.753 m)   Wt 226 lb 12.8 oz (102.9 kg)   SpO2 99%   BMI 33.49 kg/m  , BMI Body mass index is 33.49 kg/m. GEN: Well nourished, well developed, in no acute distress  HEENT: normal  Neck: no JVD, carotid bruits, or masses Cardiac: RRR; no murmurs, rubs, or gallops,no edema  Respiratory:  clear to auscultation bilaterally, normal work of breathing GI: soft, nontender, nondistended, + BS MS: no deformity or atrophy  Skin: warm and dry, no rash Neuro:  Strength and sensation are intact Psych: euthymic mood, full affect Vascular: Femoral pulses are normal bilaterally. Distal pulses are not palpable with minimal edema and stasis dermatitis.  EKG:  EKG is not ordered today.   Recent Labs: 05/18/2015: BUN 22; Creatinine, Ser 1.23; Hemoglobin 15.9; Platelets 262; Potassium 4.6; Sodium 141 07/10/2015: ALT 18    Lipid Panel    Component Value Date/Time   CHOL 107 (L) 07/10/2015 1228   TRIG 205 (H) 07/10/2015 1228   HDL 27 (L) 07/10/2015 1228   CHOLHDL 4.0 07/10/2015 1228   VLDL 41 (H) 07/10/2015 1228   LDLCALC 39 07/10/2015 1228   LDLDIRECT 107.0 04/21/2015 1352      Wt Readings  from Last 3 Encounters:  02/13/16 226 lb 12.8 oz (102.9 kg)  02/06/16 225 lb 9.6 oz (102.3 kg)  10/19/15 227 lb (103 kg)         ASSESSMENT AND PLAN:  1.   Peripheral arterial disease:  The patient reports significant worsening of bilateral calf claudication worse on the left side which is currently happening with minimal walking. He also has a lot of symptoms suggestive of peripheral neuropathy. He is overall very frustrated with his symptoms. He wants a definitive answer and thus I recommended proceeding with abdominal aortogram, lower extremity runoff and possible endovascular intervention. I discussed risks, benefits and alternatives. I explained to him that his symptoms might be mostly related to peripheral neuropathy.  2. Coronary artery disease: Status post atherectomy and drug-eluting stent placement to the LAD.  The patient has no angina.  3. Tobacco use: He is down to a quarter pack per day and is trying to quit.  4. Hyperlipidemia: Currently on high dose atorvastatin.    Disposition:   FU with me in 1 months  Signed,  Xoie Kreuser, MD  02/13/2016 9:09 AM    [facility]  

## 2016-02-13 NOTE — Patient Instructions (Signed)
Medication Instructions:  Your physician recommends that you continue on your current medications as directed. Please refer to the Current Medication list given to you today.  Labwork: Your physician recommends that you have lab work today: BMP, CBC and PT/INR  Testing/Procedures: Your physician has requested that you have a peripheral vascular angiogram. This exam is performed at the hospital. During this exam IV contrast is used to look at arterial blood flow. Please review the information sheet given for details.  Follow-Up: Your physician recommends that you schedule a follow-up appointment in: 1 MONTH with Dr Fletcher [name]   Any Other Special Instructions Will Be Listed Below (If Applicable).     If you need a refill on your cardiac medications before your next appointment, please call your pharmacy.

## 2016-02-14 ENCOUNTER — Ambulatory Visit (HOSPITAL_COMMUNITY)
Admission: RE | Admit: 2016-02-14 | Discharge: 2016-02-14 | Disposition: A | Discharge status: Home / Self Care | Payer: Medicare Other | Source: Ambulatory Visit | Attending: Cardiovascular Disease | Admitting: Cardiovascular Disease

## 2016-02-14 ENCOUNTER — Encounter (HOSPITAL_COMMUNITY)
Admission: RE | Disposition: A | Discharge status: Home / Self Care | Payer: Self-pay | Source: Ambulatory Visit | Attending: Cardiovascular Disease

## 2016-02-14 ENCOUNTER — Encounter (HOSPITAL_COMMUNITY): Payer: Self-pay | Admitting: Cardiovascular Disease

## 2016-02-14 DIAGNOSIS — K219 Gastro-esophageal reflux disease without esophagitis: Secondary | ICD-10-CM | POA: Diagnosis not present

## 2016-02-14 DIAGNOSIS — E785 Hyperlipidemia, unspecified: Secondary | ICD-10-CM | POA: Insufficient documentation

## 2016-02-14 DIAGNOSIS — I739 Peripheral vascular disease, unspecified: Secondary | ICD-10-CM

## 2016-02-14 DIAGNOSIS — Z8249 Family history of ischemic heart disease and other diseases of the circulatory system: Secondary | ICD-10-CM | POA: Diagnosis not present

## 2016-02-14 DIAGNOSIS — Z7902 Long term (current) use of antithrombotics/antiplatelets: Secondary | ICD-10-CM | POA: Diagnosis not present

## 2016-02-14 DIAGNOSIS — Z7982 Long term (current) use of aspirin: Secondary | ICD-10-CM | POA: Diagnosis not present

## 2016-02-14 DIAGNOSIS — I10 Essential (primary) hypertension: Secondary | ICD-10-CM | POA: Insufficient documentation

## 2016-02-14 DIAGNOSIS — E1140 Type 2 diabetes mellitus with diabetic neuropathy, unspecified: Secondary | ICD-10-CM | POA: Diagnosis not present

## 2016-02-14 DIAGNOSIS — E1151 Type 2 diabetes mellitus with diabetic peripheral angiopathy without gangrene: Secondary | ICD-10-CM | POA: Insufficient documentation

## 2016-02-14 DIAGNOSIS — I70213 Atherosclerosis of native arteries of extremities with intermittent claudication, bilateral legs: Secondary | ICD-10-CM | POA: Diagnosis not present

## 2016-02-14 DIAGNOSIS — Z955 Presence of coronary angioplasty implant and graft: Secondary | ICD-10-CM | POA: Insufficient documentation

## 2016-02-14 DIAGNOSIS — J449 Chronic obstructive pulmonary disease, unspecified: Secondary | ICD-10-CM | POA: Diagnosis not present

## 2016-02-14 DIAGNOSIS — F17210 Nicotine dependence, cigarettes, uncomplicated: Secondary | ICD-10-CM | POA: Insufficient documentation

## 2016-02-14 DIAGNOSIS — Z79899 Other long term (current) drug therapy: Secondary | ICD-10-CM | POA: Diagnosis not present

## 2016-02-14 DIAGNOSIS — Z823 Family history of stroke: Secondary | ICD-10-CM | POA: Insufficient documentation

## 2016-02-14 DIAGNOSIS — M109 Gout, unspecified: Secondary | ICD-10-CM | POA: Insufficient documentation

## 2016-02-14 DIAGNOSIS — I2510 Atherosclerotic heart disease of native coronary artery without angina pectoris: Secondary | ICD-10-CM | POA: Insufficient documentation

## 2016-02-14 HISTORY — PX: PERIPHERAL VASCULAR CATHETERIZATION: SHX172C

## 2016-02-14 LAB — GLUCOSE, CAPILLARY
GLUCOSE-CAPILLARY: 107 mg/dL — AB (ref 65–99)
Glucose-Capillary: 204 mg/dL — ABNORMAL HIGH (ref 65–99)

## 2016-02-14 SURGERY — ABDOMINAL AORTOGRAM W/LOWER EXTREMITY

## 2016-02-14 MED ORDER — LIDOCAINE HCL (PF) 1 % IJ SOLN
INTRAMUSCULAR | Status: DC | PRN
  Administered 2016-02-14: 20 mL

## 2016-02-14 MED ORDER — SODIUM CHLORIDE 0.9% FLUSH: 3.0000 mL | Freq: Two times a day (BID) | INTRAVENOUS | Status: DC

## 2016-02-14 MED ORDER — SODIUM CHLORIDE 0.9 % IV SOLN: INTRAVENOUS | Status: DC

## 2016-02-14 MED ORDER — IODIXANOL 320 MG/ML IV SOLN
INTRAVENOUS | Status: DC | PRN
  Administered 2016-02-14: 77 mL via INTRA_ARTERIAL

## 2016-02-14 MED ORDER — SODIUM CHLORIDE 0.9 % IV SOLN: 250.0000 mL | INTRAVENOUS | Status: DC | PRN

## 2016-02-14 MED ORDER — FENTANYL CITRATE (PF) 100 MCG/2ML IJ SOLN
INTRAMUSCULAR | Status: DC | PRN
  Administered 2016-02-14: 50 ug via INTRAVENOUS

## 2016-02-14 MED ORDER — MIDAZOLAM HCL 2 MG/2ML IJ SOLN
INTRAMUSCULAR | Status: AC
  Filled 2016-02-14: qty 2

## 2016-02-14 MED ORDER — SODIUM CHLORIDE 0.9% FLUSH: 3.0000 mL | INTRAVENOUS | Status: DC | PRN

## 2016-02-14 MED ORDER — FENTANYL CITRATE (PF) 100 MCG/2ML IJ SOLN
INTRAMUSCULAR | Status: AC
  Filled 2016-02-14: qty 2

## 2016-02-14 MED ORDER — FAMOTIDINE IN NACL 20-0.9 MG/50ML-% IV SOLN
INTRAVENOUS | Status: AC
  Administered 2016-02-14: 20 mg via INTRAVENOUS
  Filled 2016-02-14: qty 50

## 2016-02-14 MED ORDER — SODIUM CHLORIDE 0.9 % WEIGHT BASED INFUSION
3.0000 mL/kg/h | INTRAVENOUS | Status: DC
  Administered 2016-02-14: 3 mL/kg/h via INTRAVENOUS

## 2016-02-14 MED ORDER — ASPIRIN 81 MG PO CHEW: 81.0000 mg | CHEWABLE_TABLET | ORAL | Status: DC

## 2016-02-14 MED ORDER — PREDNISONE 20 MG PO TABS
ORAL_TABLET | ORAL | Status: AC
  Administered 2016-02-14: 60 mg via ORAL
  Filled 2016-02-14: qty 3

## 2016-02-14 MED ORDER — DIPHENHYDRAMINE HCL 50 MG/ML IJ SOLN
INTRAMUSCULAR | Status: AC
  Administered 2016-02-14: 25 mg via INTRAVENOUS
  Filled 2016-02-14: qty 1

## 2016-02-14 MED ORDER — DIPHENHYDRAMINE HCL 50 MG/ML IJ SOLN
25.0000 mg | INTRAMUSCULAR | Status: AC
  Administered 2016-02-14: 25 mg via INTRAVENOUS

## 2016-02-14 MED ORDER — FAMOTIDINE IN NACL 20-0.9 MG/50ML-% IV SOLN
20.0000 mg | INTRAVENOUS | Status: AC
  Administered 2016-02-14: 20 mg via INTRAVENOUS

## 2016-02-14 MED ORDER — SODIUM CHLORIDE 0.9 % WEIGHT BASED INFUSION: 1.0000 mL/kg/h | INTRAVENOUS | Status: DC

## 2016-02-14 MED ORDER — MIDAZOLAM HCL 2 MG/2ML IJ SOLN
INTRAMUSCULAR | Status: DC | PRN
  Administered 2016-02-14: 1 mg via INTRAVENOUS

## 2016-02-14 MED ORDER — HEPARIN (PORCINE) IN NACL 2-0.9 UNIT/ML-% IJ SOLN
INTRAMUSCULAR | Status: DC | PRN
  Administered 2016-02-14: 1000 mL

## 2016-02-14 MED ORDER — HEPARIN (PORCINE) IN NACL 2-0.9 UNIT/ML-% IJ SOLN
INTRAMUSCULAR | Status: AC
  Filled 2016-02-14: qty 1000

## 2016-02-14 MED ORDER — PREDNISONE 20 MG PO TABS
60.0000 mg | ORAL_TABLET | Freq: Once | ORAL | Status: AC
  Administered 2016-02-14: 60 mg via ORAL

## 2016-02-14 SURGICAL SUPPLY — 10 items
CATH ANGIO 5F PIGTAIL 65CM (CATHETERS) ×2 IMPLANT
KIT MICROINTRODUCER STIFF 5F (SHEATH) ×2 IMPLANT
KIT PV (KITS) ×2 IMPLANT
SHEATH PINNACLE 5F 10CM (SHEATH) ×2 IMPLANT
STOPCOCK MORSE 400PSI 3WAY (MISCELLANEOUS) ×2 IMPLANT
SYR MEDRAD MARK V 150ML (SYRINGE) ×2 IMPLANT
TRANSDUCER W/STOPCOCK (MISCELLANEOUS) ×2 IMPLANT
TRAY PV CATH (CUSTOM PROCEDURE TRAY) ×2 IMPLANT
TUBING HIGH PRESSURE 120CM (CONNECTOR) ×2 IMPLANT
WIRE HITORQ VERSACORE ST 145CM (WIRE) ×2 IMPLANT

## 2016-02-14 NOTE — Interval H&P Note (Signed)
History and Physical Interval Note:  02/14/2016 8:36 AM  Joshua [name]  has presented today for surgery, with the diagnosis of pvd  The various methods of treatment have been discussed with the patient and family. After consideration of risks, benefits and other options for treatment, the patient has consented to  Procedure(s): Abdominal Aortogram w/Lower Extremity (N/A) as a surgical intervention .  The patient's history has been reviewed, patient examined, no change in status, stable for surgery.  I have reviewed the patient's chart and labs.  Questions were answered to the patient's satisfaction.     Kathlyn Sacramento

## 2016-02-14 NOTE — H&P (View-Only) (Signed)
Cardiology Office Note   Date:  02/13/2016   ID:  Joshua [name], DOB [birth year], MRN [id number]  PCP:  Joshua Silversmith, NP  Cardiologist:  Dr. Marlou [name]  Chief Complaint  Patient presents with  . Follow-up    pt c/o weight gain , dizziness      History of Present Illness: Joshua [name] is a 60 y.o. male who is here today for a follow up visit regarding peripheral arterial disease. He has known history of coronary artery disease status post rotational atherectomy and drug-eluting stent placement to the mid LAD. He has known history of peripheral arterial disease status post aortobifemoral bypass in 2010 at [place], diabetes mellitus with diabetic neuropathy, hyperlipidemia and tobacco use. He was seen 6 months ago for foot burning sensation and throbbing with chronic bluish discoloration of his feet for many years.  Noninvasive vascular evaluation in March  showed normal ABI bilaterally, patent aorto bifemoral bypass and moderate left SFA stenosis.  He reports worsening bilateral calf pain with walking since last visit. This is now happening after walking 50 feet and forces him to stop and rest for few minutes before he can resume. The symptoms are worse on the left side than the right side. He also describes a lot of numbness in his feet mostly at night. He has been on gabapentin 4. With no significant relief.   Past Medical History:  Diagnosis Date  . Arthritis    "back" (05/16/2015)  . CAD in native artery, occluded Lcx and rotational atherectomy to LAD with DES 05/17/15 05/18/2015  . Chronic back pain    "all my back"  . Chronic bronchitis ([place])   . COPD (chronic obstructive pulmonary disease) ([place])   . GERD (gastroesophageal reflux disease)   . H/O blood clots    "had them in my back; put filter in before one of my neck ORs"  . History of gout   . Hyperlipidemia   . Hypertension   . Phlebitis   . Positive TB test   . Seasonal allergies   . Shortness of breath   .  Spinal disease     Past Surgical History:  Procedure Laterality Date  . ANTERIOR CERVICAL DECOMP/DISCECTOMY FUSION  ~ 2001-2009 X 3  . ANTERIOR CERVICAL DECOMP/DISCECTOMY FUSION N/A 10/06/2012   Procedure: ANTERIOR CERVICAL DECOMPRESSION/DISCECTOMY FUSION 1 LEVEL;  Surgeon: [name], MD;  Location: [facility];  Service: Neurosurgery;  Laterality: N/A;  C7T1 anterior cervical decompression with fusion plating and bonegraft   . BACK SURGERY    . CARDIAC CATHETERIZATION  05/16/2015  . CARDIAC CATHETERIZATION N/A 05/16/2015   Procedure: Left Heart Cath and Coronary Angiography;  Surgeon: [name], MD;  Location: [place] [facility] [place] [facility];  Service: Cardiovascular;  Laterality: N/A;  . CARDIAC CATHETERIZATION N/A 05/16/2015   Procedure: Coronary Balloon Angioplasty;  Surgeon: [name], MD;  Location: [facility];  Service: Cardiovascular;  Laterality: N/A;  . CARDIAC CATHETERIZATION N/A 05/17/2015   Procedure: Coronary Stent Intervention Rotoblater;  Surgeon: [name], MD;  Location: [facility];  Service: Cardiovascular;  Laterality: N/A;  . ELBOW SURGERY Left 2016   "had bone pieces removed"  . EXCISIONAL HEMORRHOIDECTOMY    . FEMORAL BYPASS  ~ 2010  . IVC FILTER PLACEMENT ([place] HX)  2010   "had blood clots in my back"  . POSTERIOR LAMINECTOMY / DECOMPRESSION LUMBAR SPINE  2006   "bone spurs"     Current Outpatient  Prescriptions  Medication Sig Dispense Refill  . albuterol (PROVENTIL HFA;VENTOLIN HFA) 108 (90 Base) MCG/ACT inhaler Inhale 2 puffs into the lungs every 6 (six) hours as needed for wheezing or shortness of breath.    [name] aspirin 81 MG chewable tablet Chew 1 tablet (81 mg total) by mouth daily.    [name] atorvastatin (LIPITOR) 80 MG tablet Take 1 tablet (80 mg total) by mouth daily at 6 PM. 90 tablet 3  . bisoprolol (ZEBETA) 5 MG tablet Take 0.5 tablets (2.5 mg total) by mouth daily. 45 tablet 3  . Blood Glucose Monitoring Suppl (ONETOUCH VERIO)  w/Device KIT 1 Device by Does not apply route once. 1 kit 0  . cloNIDine (CATAPRES) 0.3 MG tablet Take 1 tablet (0.3 mg total) by mouth 2 (two) times daily. 180 tablet 3  . clopidogrel (PLAVIX) 75 MG tablet Take 1 tablet (75 mg total) by mouth daily. 94 tablet 3  . colchicine 0.6 MG tablet Take 2 tablets now, then 1 tablet after 1 hour. Can take 1 tab BID thereafter. 30 tablet 0  . gabapentin (NEURONTIN) 800 MG tablet Take 1 tablet (800 mg total) by mouth 3 (three) times daily. PLEASE SCHEDULE FOLLOW UP VISIT January 2018 270 tablet 1  . glucose blood (ONETOUCH VERIO) test strip 1 each by Other route 3 (three) times daily. Use as instructed 100 each 11  . hydrochlorothiazide (HYDRODIURIL) 25 MG tablet Take 1 tablet (25 mg total) by mouth daily. 90 tablet 3  . levocetirizine (XYZAL) 5 MG tablet Take 1 tablet (5 mg total) by mouth every evening. 30 tablet 2  . losartan (COZAAR) 25 MG tablet Take 0.5 tablets (12.5 mg total) by mouth daily. 45 tablet 3  . nitroGLYCERIN (NITROSTAT) 0.4 MG SL tablet Place 1 tablet (0.4 mg total) under the tongue every 5 (five) minutes as needed for chest pain. 25 tablet 4  . Omega-3 Fatty Acids (FISH OIL) 1200 MG CAPS Take 1 capsule by mouth 3 (three) times daily.    [name] omeprazole (PRILOSEC) 20 MG capsule Take 1 capsule (20 mg total) by mouth daily. PLEASE SCHEDULE FOLLOW UP VISIT January 2018 90 capsule 1  . ONE TOUCH LANCETS MISC 1 each by Does not apply route 3 (three) times daily. 200 each 5  . oxyCODONE-acetaminophen (PERCOCET) 7.5-325 MG tablet Take 1 tablet by mouth 3 (three) times daily as needed for moderate pain.     . traZODone (DESYREL) 50 MG tablet Take 1 tablet (50 mg total) by mouth at bedtime as needed. PLEASE SCHEDULE FOLLOW UP VISIT January 2018 90 tablet 1   Current Facility-Administered Medications  Medication Dose Route Frequency Provider Last Rate Last Dose  . levalbuterol (XOPENEX) nebulizer solution 0.63 mg  0.63 mg Nebulization Once Brett Canales,  PA-C        Allergies:   Lisinopril; Contrast media [iodinated diagnostic agents]; and Ibuprofen    Social History:  The patient  reports that he has been smoking Cigarettes.  He has a 23.50 pack-year smoking history. He has never used smokeless tobacco. He reports that he drinks alcohol. He reports that he does not use drugs.   Family History:  The patient's family history includes Healthy in his daughter; Heart attack in his father; Hypertension in his father and mother; Stomach cancer in his mother; Stroke in his father.    ROS:  Please see the history of present illness.   Otherwise, review of systems are positive for none.   All other systems are  reviewed and negative.    PHYSICAL EXAM: VS:  BP 102/70 (BP Location: Right Arm, Patient Position: Sitting, Cuff Size: Normal)   Pulse 63   Ht 5' 9" (1.753 m)   Wt 226 lb 12.8 oz (102.9 kg)   SpO2 99%   BMI 33.49 kg/m  , BMI Body mass index is 33.49 kg/m. GEN: Well nourished, well developed, in no acute distress  HEENT: normal  Neck: no JVD, carotid bruits, or masses Cardiac: RRR; no murmurs, rubs, or gallops,no edema  Respiratory:  clear to auscultation bilaterally, normal work of breathing GI: soft, nontender, nondistended, + BS MS: no deformity or atrophy  Skin: warm and dry, no rash Neuro:  Strength and sensation are intact Psych: euthymic mood, full affect Vascular: Femoral pulses are normal bilaterally. Distal pulses are not palpable with minimal edema and stasis dermatitis.  EKG:  EKG is not ordered today.   Recent Labs: 05/18/2015: BUN 22; Creatinine, Ser 1.23; Hemoglobin 15.9; Platelets 262; Potassium 4.6; Sodium 141 07/10/2015: ALT 18    Lipid Panel    Component Value Date/Time   CHOL 107 (L) 07/10/2015 1228   TRIG 205 (H) 07/10/2015 1228   HDL 27 (L) 07/10/2015 1228   CHOLHDL 4.0 07/10/2015 1228   VLDL 41 (H) 07/10/2015 1228   LDLCALC 39 07/10/2015 1228   LDLDIRECT 107.0 04/21/2015 1352      Wt Readings  from Last 3 Encounters:  02/13/16 226 lb 12.8 oz (102.9 kg)  02/06/16 225 lb 9.6 oz (102.3 kg)  10/19/15 227 lb (103 kg)         ASSESSMENT AND PLAN:  1.   Peripheral arterial disease:  The patient reports significant worsening of bilateral calf claudication worse on the left side which is currently happening with minimal walking. He also has a lot of symptoms suggestive of peripheral neuropathy. He is overall very frustrated with his symptoms. He wants a definitive answer and thus I recommended proceeding with abdominal aortogram, lower extremity runoff and possible endovascular intervention. I discussed risks, benefits and alternatives. I explained to him that his symptoms might be mostly related to peripheral neuropathy.  2. Coronary artery disease: Status post atherectomy and drug-eluting stent placement to the LAD.  The patient has no angina.  3. Tobacco use: He is down to a quarter pack per day and is trying to quit.  4. Hyperlipidemia: Currently on high dose atorvastatin.    Disposition:   FU with me in 1 months  Signed,  Kathlyn Sacramento, MD  02/13/2016 9:09 AM    [place]

## 2016-02-14 NOTE — Progress Notes (Signed)
Site area: rt groin Site Prior to Removal:  Level 0 Pressure Applied For:  20 minutes Manual:   yes Patient Status During Pull:  stable Post Pull Site:  Level 0 Post Pull Instructions Given:  yes Post Pull Pulses Present: yes Dressing Applied:  tegaderm Bedrest begins @ 1683 Comments:

## 2016-02-14 NOTE — Discharge Instructions (Signed)
Angiogram, Care After °These instructions give you information about caring for yourself after your procedure. Your doctor may also give you more specific instructions. Call your doctor if you have any problems or questions after your procedure. °Follow these instructions at home: °· Take medicines only as told by your doctor. °· Follow your doctor's instructions about: °¨ Care of the area where the tube was inserted. °¨ Bandage (dressing) changes and removal. °· You may shower 24-48 hours after the procedure or as told by your doctor. °· Do not take baths, swim, or use a hot tub until your doctor approves. °· Every day, check the area where the tube was inserted. Watch for: °¨ Redness, swelling, or pain. °¨ Fluid, blood, or pus. °· Do not apply powder or lotion to the site. °· Do not lift anything that is heavier than 10 lb (4.5 kg) for 5 days or as told by your doctor. °· Ask your doctor when you can: °¨ Return to work or school. °¨ Do physical activities or play sports. °¨ Have sex. °· Do not drive or operate heavy machinery for 24 hours or as told by your doctor. °· Have someone with you for the first 24 hours after the procedure. °· Keep all follow-up visits as told by your doctor. This is important. °Contact a health care provider if: °· You have a fever. °· You have chills. °· You have more bleeding from the area where the tube was inserted. Hold pressure on the area. °· You have redness, swelling, or pain in the area where the tube was inserted. °· You have fluid or pus coming from the area. °Get help right away if: °· You have a lot of pain in the area where the tube was inserted. °· The area where the tube was inserted is bleeding, and the bleeding does not stop after 30 minutes of holding steady pressure on the area. °· The area near or just beyond the insertion site becomes pale, cool, tingly, or numb. °This information is not intended to replace advice given to you by your health care provider. Make  sure you discuss any questions you have with your health care provider. °Document Released: 05/31/2008 Document Revised: 08/10/2015 Document Reviewed: 08/05/2012 °Elsevier Interactive Patient Education © 2017 [employer] ° °

## 2016-02-16 ENCOUNTER — Other Ambulatory Visit: Payer: Self-pay

## 2016-02-16 MED ORDER — LEVOCETIRIZINE DIHYDROCHLORIDE 5 MG PO TABS: 5.0000 mg | ORAL_TABLET | Freq: Every evening | ORAL | 0 refills | Status: DC

## 2016-02-16 NOTE — Telephone Encounter (Signed)
Joshua [name] pts wife (DPR signed) request 90 day refill for xyzal for sinus to [facility]. Pt had annual 09/25/15; but pt has multiple medical problems and was recently in hospital for PVD. Unable to reach pt or his wife for further info. Is it OK to refill?

## 2016-02-16 NOTE — Telephone Encounter (Signed)
Sent electronically 

## 2016-02-19 DIAGNOSIS — M545 Low back pain: Secondary | ICD-10-CM | POA: Diagnosis not present

## 2016-02-19 DIAGNOSIS — M79672 Pain in left foot: Secondary | ICD-10-CM | POA: Diagnosis not present

## 2016-02-19 DIAGNOSIS — G894 Chronic pain syndrome: Secondary | ICD-10-CM | POA: Diagnosis not present

## 2016-02-19 DIAGNOSIS — M542 Cervicalgia: Secondary | ICD-10-CM | POA: Diagnosis not present

## 2016-02-19 DIAGNOSIS — M79671 Pain in right foot: Secondary | ICD-10-CM | POA: Diagnosis not present

## 2016-02-19 DIAGNOSIS — Z79891 Long term (current) use of opiate analgesic: Secondary | ICD-10-CM | POA: Diagnosis not present

## 2016-02-22 ENCOUNTER — Encounter: Payer: Self-pay | Admitting: Internal Medicine

## 2016-02-22 ENCOUNTER — Ambulatory Visit (INDEPENDENT_AMBULATORY_CARE_PROVIDER_SITE_OTHER): Payer: Medicare Other | Admitting: Internal Medicine

## 2016-02-22 VITALS — BP 136/74 | HR 60 | Temp 98.2°F | Wt 229.0 lb

## 2016-02-22 DIAGNOSIS — M792 Neuralgia and neuritis, unspecified: Secondary | ICD-10-CM

## 2016-02-22 DIAGNOSIS — M5416 Radiculopathy, lumbar region: Secondary | ICD-10-CM

## 2016-02-22 MED ORDER — AMITRIPTYLINE HCL 50 MG PO TABS: 50.0000 mg | ORAL_TABLET | Freq: Every day | ORAL | 1 refills | Status: DC

## 2016-02-26 ENCOUNTER — Encounter: Payer: Self-pay | Admitting: Internal Medicine

## 2016-02-26 NOTE — Patient Instructions (Signed)
Neuropathic Pain Introduction Neuropathic pain is pain caused by damage to the nerves that are responsible for certain sensations in your body (sensory nerves). The pain can be caused by damage to:  The sensory nerves that send signals to your spinal cord and brain (peripheral nervous system).  The sensory nerves in your brain or spinal cord (central nervous system). Neuropathic pain can make you more sensitive to pain. What would be a minor sensation for most people may feel very painful if you have neuropathic pain. This is usually a long-term condition that can be difficult to treat. The type of pain can differ from person to person. It may start suddenly (acute), or it may develop slowly and last for a long time (chronic). Neuropathic pain may come and go as damaged nerves heal or may stay at the same level for years. It often causes emotional distress, loss of sleep, and a lower quality of life. What are the causes? The most common cause of damage to a sensory nerve is diabetes. Many other diseases and conditions can also cause neuropathic pain. Causes of neuropathic pain can be classified as:  Toxic. Many drugs and chemicals can cause toxic damage. The most common cause of toxic neuropathic pain is damage from drug treatment for cancer (chemotherapy).  Metabolic. This type of pain can happen when a disease causes imbalances that damage nerves. Diabetes is the most common of these diseases. Vitamin B deficiency caused by long-term alcohol abuse is another common cause.  Traumatic. Any injury that cuts, crushes, or stretches a nerve can cause damage and pain. A common example is feeling pain after losing an arm or leg (phantom limb pain).  Compression-related. If a sensory nerve gets trapped or compressed for a long period of time, the blood supply to the nerve can be cut off.  Vascular. Many blood vessel diseases can cause neuropathic pain by decreasing blood supply and oxygen to  nerves.  Autoimmune. This type of pain results from diseases in which the body's defense system mistakenly attacks sensory nerves. Examples of autoimmune diseases that can cause neuropathic pain include lupus and multiple sclerosis.  Infectious. Many types of viral infections can damage sensory nerves and cause pain. Shingles infection is a common cause of this type of pain.  Inherited. Neuropathic pain can be a symptom of many diseases that are passed down through families (genetic). What are the signs or symptoms? The main symptom is pain. Neuropathic pain is often described as:  Burning.  Shock-like.  Stinging.  Hot or cold.  Itching. How is this diagnosed? No single test can diagnose neuropathic pain. Your health care provider will do a physical exam and ask you about your pain. You may use a pain scale to describe how bad your pain is. You may also have tests to see if you have a high sensitivity to pain and to help find the cause and location of any sensory nerve damage. These tests may include:  Imaging studies, such as:  X-rays.  CT scan.  MRI.  Nerve conduction studies to test how well nerve signals travel through your sensory nerves (electrodiagnostic testing).  Stimulating your sensory nerves through electrodes on your skin and measuring the response in your spinal cord and brain (somatosensory evoked potentials). How is this treated? Treatment for neuropathic pain may change over time. You may need to try different treatment options or a combination of treatments. Some options include:  Over-the-counter pain relievers.  Prescription medicines. Some medicines used to treat  other conditions may also help neuropathic pain. These include medicines to:  Control seizures (anticonvulsants).  Relieve depression (antidepressants).  Prescription-strength pain relievers (narcotics). These are usually used when other pain relievers do not help.  Transcutaneous nerve  stimulation (TENS). This uses electrical currents to block painful nerve signals. The treatment is painless.  Topical and local anesthetics. These are medicines that numb the nerves. They can be injected as a nerve block or applied to the skin.  Alternative treatments, such as:  Acupuncture.  Meditation.  Massage.  Physical therapy.  Pain management programs.  Counseling. Follow these instructions at home:  Learn as much as you can about your condition.  Take medicines only as directed by your health care provider.  Work closely with all your health care providers to find what works best for you.  Have a good support system at home.  Consider joining a chronic pain support group. Contact a health care provider if:  Your pain treatments are not helping.  You are having side effects from your medicines.  You are struggling with fatigue, mood changes, depression, or anxiety. This information is not intended to replace advice given to you by your health care provider. Make sure you discuss any questions you have with your health care provider. Document Released: 11/30/2003 Document Revised: 09/22/2015 Document Reviewed: 08/12/2013  2017 Elsevier

## 2016-02-26 NOTE — Progress Notes (Signed)
Subjective:    Patient ID: Joshua [name], male    DOB: 1955-10-18, 60 y.o.   MRN: [id number]  HPI  Pt presents to the clinic today to discuss medication management for his peripheral neuropathy. He is currently taking Gabapentin and Percocet (for which he follows with pain management). He feels like his neuropathy is getting worse, or either the Gabapentin is no longer effective. He has a friend who told him that he was taking Amitriptyline for neuropathy, and that it had "changed his life". He is requesting a RX for Amitriptyline today.  He is also requesting a referral to [facility] on [address]. He is currently following with [facility]. He has been satisfied with their care but is not happy with the amount of paperwork he has to fill out prior to each appt.  Review of Systems      Past Medical History:  Diagnosis Date  . Arthritis    "back" (05/16/2015)  . CAD in native artery, occluded Lcx and rotational atherectomy to LAD with DES 05/17/15 05/18/2015  . Chronic back pain    "all my back"  . Chronic bronchitis ([place])   . COPD (chronic obstructive pulmonary disease) ([place])   . GERD (gastroesophageal reflux disease)   . H/O blood clots    "had them in my back; put filter in before one of my neck ORs"  . History of gout   . Hyperlipidemia   . Hypertension   . Phlebitis   . Positive TB test   . Seasonal allergies   . Shortness of breath   . Spinal disease     Current Outpatient Prescriptions  Medication Sig Dispense Refill  . albuterol (PROVENTIL HFA;VENTOLIN HFA) 108 (90 Base) MCG/ACT inhaler Inhale 2 puffs into the lungs every 6 (six) hours as needed for wheezing or shortness of breath.    [name] aspirin 81 MG chewable tablet Chew 1 tablet (81 mg total) by mouth daily.    [name] atorvastatin (LIPITOR) 80 MG tablet Take 1 tablet (80 mg total) by mouth daily at 6 PM. 90 tablet 3  . bisoprolol (ZEBETA) 5 MG tablet Take 0.5 tablets (2.5 mg total) by mouth daily.  45 tablet 3  . Blood Glucose Monitoring Suppl (ONETOUCH VERIO) w/Device KIT 1 Device by Does not apply route once. 1 kit 0  . cloNIDine (CATAPRES) 0.3 MG tablet Take 1 tablet (0.3 mg total) by mouth 2 (two) times daily. 180 tablet 3  . clopidogrel (PLAVIX) 75 MG tablet Take 1 tablet (75 mg total) by mouth daily. 94 tablet 3  . colchicine 0.6 MG tablet Take 2 tablets now, then 1 tablet after 1 hour. Can take 1 tab BID thereafter. (Patient taking differently: Take 0.6 mg by mouth 2 (two) times daily as needed (gout pain). ) 30 tablet 0  . fluticasone (FLOVENT HFA) 44 MCG/ACT inhaler Inhale 1 puff into the lungs 2 (two) times daily as needed (shortness of breath).    [name] glucose blood (ONETOUCH VERIO) test strip 1 each by Other route 3 (three) times daily. Use as instructed 100 each 11  . hydrochlorothiazide (HYDRODIURIL) 25 MG tablet Take 1 tablet (25 mg total) by mouth daily. 90 tablet 3  . levocetirizine (XYZAL) 5 MG tablet Take 1 tablet (5 mg total) by mouth every evening. 90 tablet 0  . losartan (COZAAR) 25 MG tablet Take 0.5 tablets (12.5 mg total) by mouth daily. 45 tablet 3  . nitroGLYCERIN (NITROSTAT) 0.4 MG SL tablet  Place 1 tablet (0.4 mg total) under the tongue every 5 (five) minutes as needed for chest pain. 25 tablet 4  . Omega-3 Fatty Acids (FISH OIL) 1200 MG CAPS Take 1,200 mg by mouth daily.     [name] omeprazole (PRILOSEC) 20 MG capsule Take 1 capsule (20 mg total) by mouth daily. PLEASE SCHEDULE FOLLOW UP VISIT January 2018 (Patient taking differently: Take 20 mg by mouth daily as needed (heartburn). ) 90 capsule 1  . ONE TOUCH LANCETS MISC 1 each by Does not apply route 3 (three) times daily. 200 each 5  . oxyCODONE-acetaminophen (PERCOCET) 7.5-325 MG tablet Take 1 tablet by mouth 3 (three) times daily as needed for moderate pain.     . traZODone (DESYREL) 50 MG tablet Take 1 tablet (50 mg total) by mouth at bedtime as needed. PLEASE SCHEDULE FOLLOW UP VISIT January 2018 (Patient taking  differently: Take 50 mg by mouth at bedtime as needed for sleep. PLEASE SCHEDULE FOLLOW UP VISIT January 2018) 90 tablet 1  . amitriptyline (ELAVIL) 50 MG tablet Take 1 tablet (50 mg total) by mouth at bedtime. 90 tablet 1   No current facility-administered medications for this visit.     Allergies  Allergen Reactions  . Lisinopril Hives and Itching  . Contrast Media [Iodinated Diagnostic Agents] Hives and Itching    Pt can use if taking benadryl   . Ibuprofen Other (See Comments)    Internal bleeding    Family History  Problem Relation Age of Onset  . Stomach cancer Mother     Deceased, 74s  . Hypertension Mother   . Hypertension Father   . Stroke Father   . Heart attack Father     Deceased, 82  . Healthy Daughter   . Diabetes Neg Hx   . Early death Neg Hx     Social History   Social History  . Marital status: Married    Spouse name: N/A  . Number of children: N/A  . Years of education: 7   Occupational History  . Disabled Disabled   Social History Main Topics  . Smoking status: Current Every Day Smoker    Packs/day: 0.25    Years: 47.00    Types: Cigarettes  . Smokeless tobacco: Never Used  . 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 Drug use: No  . Sexual activity: Not Currently   Other Topics Concern  . Not on file   Social History Narrative   Regular exercise-no   Caffeine Use-yes   Lives with wife in a one story home. Has 2 children.  On disability for low back pain.  Used to work as a Games developer, lasted worked in 2007.     Education: 7th grade.     Constitutional: Denies fever, malaise, fatigue, headache or abrupt weight changes.  Neurological: Pt reports burning, numbness and tingling in feet. Denies dizziness, difficulty with memory, difficulty with speech or problems with balance and coordination.    No other specific complaints in a complete review of  systems (except as listed in HPI above).  Objective:   Physical Exam   BP 136/74   Pulse 60   Temp 98.2 F (36.8 C) (Oral)   Wt 229 lb (103.9 kg)   SpO2 98%   BMI 33.82 kg/m  Wt Readings from Last 3 Encounters:  02/22/16 229 lb (103.9 kg)  02/14/16 225  lb (102.1 kg)  02/13/16 226 lb 12.8 oz (102.9 kg)    General: Appears his stated age, obese in NAD. Musculoskeletal: Normal flexion, extension and rotation of the spine. Pain with palpation over the lumbar spine. No difficulty with gait.  Neurological: Sensation decreased in BLE. Normal proprioception. Psychiatric: Mood and affect normal. Behavior is normal. Judgment and thought content normal.     BMET    Component Value Date/Time   NA 138 02/13/2016 0949   K 4.6 02/13/2016 0949   CL 100 02/13/2016 0949   CO2 29 02/13/2016 0949   GLUCOSE 109 (H) 02/13/2016 0949   BUN 14 02/13/2016 0949   CREATININE 1.01 02/13/2016 0949   CALCIUM 9.5 02/13/2016 0949   GFRNONAA >60 05/18/2015 0725   GFRAA >60 05/18/2015 0725    Lipid Panel     Component Value Date/Time   CHOL 107 (L) 07/10/2015 1228   TRIG 205 (H) 07/10/2015 1228   HDL 27 (L) 07/10/2015 1228   CHOLHDL 4.0 07/10/2015 1228   VLDL 41 (H) 07/10/2015 1228   LDLCALC 39 07/10/2015 1228    CBC    Component Value Date/Time   WBC 10.1 02/13/2016 0949   RBC 5.73 02/13/2016 0949   HGB 17.1 02/13/2016 0949   HCT 50.3 (H) 02/13/2016 0949   PLT 253 02/13/2016 0949   MCV 87.8 02/13/2016 0949   MCH 29.8 02/13/2016 0949   MCHC 34.0 02/13/2016 0949   RDW 15.0 02/13/2016 0949   LYMPHSABS 3.1 05/15/2015 1017   MONOABS 1.2 (H) 05/15/2015 1017   EOSABS 0.3 05/15/2015 1017   BASOSABS 0.1 05/15/2015 1017    Hgb A1C Lab Results  Component Value Date   HGBA1C 6.2 01/31/2015           Assessment & Plan:   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

## 2016-03-04 ENCOUNTER — Other Ambulatory Visit: Payer: Self-pay | Admitting: Cardiovascular Disease

## 2016-03-04 DIAGNOSIS — I739 Peripheral vascular disease, unspecified: Secondary | ICD-10-CM

## 2016-03-05 ENCOUNTER — Other Ambulatory Visit: Payer: Self-pay | Admitting: Cardiovascular Disease

## 2016-03-05 DIAGNOSIS — I739 Peripheral vascular disease, unspecified: Secondary | ICD-10-CM

## 2016-03-07 ENCOUNTER — Ambulatory Visit (HOSPITAL_COMMUNITY)
Admission: RE | Admit: 2016-03-07 | Discharge: 2016-03-07 | Disposition: A | Discharge status: Home / Self Care | Payer: Medicare Other | Source: Ambulatory Visit | Attending: Cardiology | Admitting: Cardiology

## 2016-03-07 ENCOUNTER — Other Ambulatory Visit: Payer: Self-pay | Admitting: Cardiovascular Disease

## 2016-03-07 DIAGNOSIS — E1151 Type 2 diabetes mellitus with diabetic peripheral angiopathy without gangrene: Secondary | ICD-10-CM | POA: Diagnosis not present

## 2016-03-07 DIAGNOSIS — Z95828 Presence of other vascular implants and grafts: Secondary | ICD-10-CM | POA: Insufficient documentation

## 2016-03-07 DIAGNOSIS — J449 Chronic obstructive pulmonary disease, unspecified: Secondary | ICD-10-CM | POA: Insufficient documentation

## 2016-03-07 DIAGNOSIS — I739 Peripheral vascular disease, unspecified: Secondary | ICD-10-CM

## 2016-03-07 DIAGNOSIS — I2510 Atherosclerotic heart disease of native coronary artery without angina pectoris: Secondary | ICD-10-CM | POA: Insufficient documentation

## 2016-03-07 DIAGNOSIS — I10 Essential (primary) hypertension: Secondary | ICD-10-CM | POA: Diagnosis not present

## 2016-03-07 DIAGNOSIS — Z87891 Personal history of nicotine dependence: Secondary | ICD-10-CM | POA: Insufficient documentation

## 2016-03-19 DIAGNOSIS — M79672 Pain in left foot: Secondary | ICD-10-CM | POA: Diagnosis not present

## 2016-03-19 DIAGNOSIS — G629 Polyneuropathy, unspecified: Secondary | ICD-10-CM | POA: Diagnosis not present

## 2016-03-19 DIAGNOSIS — M545 Low back pain: Secondary | ICD-10-CM | POA: Diagnosis not present

## 2016-03-19 DIAGNOSIS — Z79891 Long term (current) use of opiate analgesic: Secondary | ICD-10-CM | POA: Diagnosis not present

## 2016-03-19 DIAGNOSIS — M79671 Pain in right foot: Secondary | ICD-10-CM | POA: Diagnosis not present

## 2016-03-19 DIAGNOSIS — G894 Chronic pain syndrome: Secondary | ICD-10-CM | POA: Diagnosis not present

## 2016-03-20 ENCOUNTER — Telehealth: Payer: Self-pay | Admitting: Cardiovascular Disease

## 2016-03-20 NOTE — Telephone Encounter (Signed)
Pt returning call from a few days ago,concerning test results.

## 2016-03-20 NOTE — Telephone Encounter (Signed)
  Notes Recorded by Wellington Hampshire, MD on 03/08/2016 at 4:28 PM EST Inform patient that ABI was normal and the stenosis after the bypass on the left side is not significant enough to require intervention. Continue medical therapy.

## 2016-03-20 NOTE — Telephone Encounter (Signed)
Goes to VM. Left msg to call.

## 2016-03-26 ENCOUNTER — Ambulatory Visit (INDEPENDENT_AMBULATORY_CARE_PROVIDER_SITE_OTHER): Payer: Medicare Other | Admitting: Cardiovascular Disease

## 2016-03-26 ENCOUNTER — Encounter: Payer: Self-pay | Admitting: Cardiovascular Disease

## 2016-03-26 VITALS — BP 116/70 | HR 72 | Ht 69.0 in | Wt 228.6 lb

## 2016-03-26 DIAGNOSIS — Z720 Tobacco use: Secondary | ICD-10-CM | POA: Diagnosis not present

## 2016-03-26 DIAGNOSIS — I2510 Atherosclerotic heart disease of native coronary artery without angina pectoris: Secondary | ICD-10-CM | POA: Diagnosis not present

## 2016-03-26 DIAGNOSIS — I739 Peripheral vascular disease, unspecified: Secondary | ICD-10-CM | POA: Diagnosis not present

## 2016-03-26 DIAGNOSIS — E785 Hyperlipidemia, unspecified: Secondary | ICD-10-CM | POA: Diagnosis not present

## 2016-03-26 NOTE — Progress Notes (Signed)
Cardiology Office Note   Date:  03/26/2016   ID:  MCKALE HAFFEY, DOB 11-Dec-1955, MRN [id number]  PCP:  Webb Silversmith, NP  Cardiologist:  Dr. Marlou Porch  Chief Complaint  Patient presents with  . Post angiogram f/u      History of Present Illness: Casimiro WALRATH is a 61 y.o. male who is here today for a follow up visit regarding peripheral arterial disease. He has known history of coronary artery disease status post rotational atherectomy and drug-eluting stent placement to the mid LAD. He has known history of peripheral arterial disease status post aortobifemoral bypass in 2010 at [place], diabetes mellitus with diabetic neuropathy, hyperlipidemia and tobacco use. He was seen 6 months ago for foot burning sensation and throbbing with chronic bluish discoloration of his feet for many years.  Noninvasive vascular evaluation in March  showed normal ABI bilaterally, patent aorto bifemoral bypass and moderate left SFA stenosis. He was seen in November for worsening bilateral leg pain and symptoms of peripheral neuropathy in addition to claudication. I proceeded with angiography which showed patent aortobifemoral bypass with moderate stenosis affecting the left common femoral artery and ostial left SFA. There was borderline significant diffuse disease affecting the right SFA. These findings did not explain his symptoms. He underwent limited duplex mostly to evaluate the left common femoral and ostial left SFA. The velocity was moderately elevated. His ABI was 0.93 on the right and 1.1 on the left. He reports improved symptoms overall. He continues to smoke.   Past Medical History:  Diagnosis Date  . Arthritis    "back" (05/16/2015)  . CAD in native artery, occluded Lcx and rotational atherectomy to LAD with DES 05/17/15 05/18/2015  . Chronic back pain    "all my back"  . Chronic bronchitis ([place])   . COPD (chronic obstructive pulmonary disease) ([place])   . GERD (gastroesophageal reflux  disease)   . H/O blood clots    "had them in my back; put filter in before one of my neck ORs"  . History of gout   . Hyperlipidemia   . Hypertension   . Phlebitis   . Positive TB test   . Seasonal allergies   . Shortness of breath   . Spinal disease     Past Surgical History:  Procedure Laterality Date  . ANTERIOR CERVICAL DECOMP/DISCECTOMY FUSION  ~ 2001-2009 X 3  . ANTERIOR CERVICAL DECOMP/DISCECTOMY FUSION N/A 10/06/2012   Procedure: ANTERIOR CERVICAL DECOMPRESSION/DISCECTOMY FUSION 1 LEVEL;  Surgeon: [name], MD;  Location: [place] [facility];  Service: Neurosurgery;  Laterality: N/A;  C7T1 anterior cervical decompression with fusion plating and bonegraft   . BACK SURGERY    . CARDIAC CATHETERIZATION  05/16/2015  . CARDIAC CATHETERIZATION N/A 05/16/2015   Procedure: Left Heart Cath and Coronary Angiography;  Surgeon: [name], MD;  Location: [place] [facility];  Service: Cardiovascular;  Laterality: N/A;  . CARDIAC CATHETERIZATION N/A 05/16/2015   Procedure: Coronary Balloon Angioplasty;  Surgeon: [name], MD;  Location: [place] [facility];  Service: Cardiovascular;  Laterality: N/A;  . CARDIAC CATHETERIZATION N/A 05/17/2015   Procedure: Coronary Stent Intervention Rotoblater;  Surgeon: [name], MD;  Location: [place] [facility];  Service: Cardiovascular;  Laterality: N/A;  . ELBOW SURGERY Left 2016   "had bone pieces removed"  . EXCISIONAL HEMORRHOIDECTOMY    . FEMORAL BYPASS  ~ 2010  . IVC FILTER PLACEMENT ([facility] [facility])  2010   "had blood clots  in my back"  . PERIPHERAL VASCULAR CATHETERIZATION N/A 02/14/2016   Procedure: Abdominal Aortogram w/Lower Extremity;  Surgeon: Wellington Hampshire, MD;  Location: [place] [facility];  Service: Cardiovascular;  Laterality: N/A;  . POSTERIOR LAMINECTOMY / DECOMPRESSION LUMBAR SPINE  2006   "bone spurs"     Current Outpatient Prescriptions  Medication Sig Dispense Refill  . albuterol (PROVENTIL HFA;VENTOLIN HFA) 108  (90 Base) MCG/ACT inhaler Inhale 2 puffs into the lungs every 6 (six) hours as needed for wheezing or shortness of breath.    [name] amitriptyline (ELAVIL) 50 MG tablet Take 1 tablet (50 mg total) by mouth at bedtime. 90 tablet 1  . aspirin 81 MG chewable tablet Chew 1 tablet (81 mg total) by mouth daily.    [name] atorvastatin (LIPITOR) 80 MG tablet Take 1 tablet (80 mg total) by mouth daily at 6 PM. 90 tablet 3  . bisoprolol (ZEBETA) 5 MG tablet Take 0.5 tablets (2.5 mg total) by mouth daily. 45 tablet 3  . Blood Glucose Monitoring Suppl (ONETOUCH VERIO) w/Device KIT 1 Device by Does not apply route once. 1 kit 0  . cloNIDine (CATAPRES) 0.3 MG tablet Take 1 tablet (0.3 mg total) by mouth 2 (two) times daily. 180 tablet 3  . clopidogrel (PLAVIX) 75 MG tablet Take 1 tablet (75 mg total) by mouth daily. 94 tablet 3  . colchicine 0.6 MG tablet Take 2 tablets now, then 1 tablet after 1 hour. Can take 1 tab BID thereafter. (Patient taking differently: Take 0.6 mg by mouth 2 (two) times daily as needed (gout pain). ) 30 tablet 0  . fluticasone (FLOVENT HFA) 44 MCG/ACT inhaler Inhale 1 puff into the lungs 2 (two) times daily as needed (shortness of breath).    [name] glucose blood (ONETOUCH VERIO) test strip 1 each by Other route 3 (three) times daily. Use as instructed 100 each 11  . hydrochlorothiazide (HYDRODIURIL) 25 MG tablet Take 1 tablet (25 mg total) by mouth daily. 90 tablet 3  . levocetirizine (XYZAL) 5 MG tablet Take 1 tablet (5 mg total) by mouth every evening. 90 tablet 0  . losartan (COZAAR) 25 MG tablet Take 0.5 tablets (12.5 mg total) by mouth daily. 45 tablet 3  . nitroGLYCERIN (NITROSTAT) 0.4 MG SL tablet Place 1 tablet (0.4 mg total) under the tongue every 5 (five) minutes as needed for chest pain. 25 tablet 4  . Omega-3 Fatty Acids (FISH OIL) 1200 MG CAPS Take 1,200 mg by mouth daily.     [name] omeprazole (PRILOSEC) 20 MG capsule Take 1 capsule (20 mg total) by mouth daily. PLEASE SCHEDULE FOLLOW UP VISIT  January 2018 (Patient taking differently: Take 20 mg by mouth daily as needed (heartburn). ) 90 capsule 1  . ONE TOUCH LANCETS MISC 1 each by Does not apply route 3 (three) times daily. 200 each 5  . oxyCODONE-acetaminophen (PERCOCET) 7.5-325 MG tablet Take 1 tablet by mouth 3 (three) times daily as needed for moderate pain.     . traZODone (DESYREL) 50 MG tablet Take 1 tablet (50 mg total) by mouth at bedtime as needed. PLEASE SCHEDULE FOLLOW UP VISIT January 2018 (Patient taking differently: Take 50 mg by mouth at bedtime as needed for sleep. PLEASE SCHEDULE FOLLOW UP VISIT January 2018) 90 tablet 1   No current facility-administered medications for this visit.     Allergies:   Lisinopril; Contrast media [iodinated diagnostic agents]; and Ibuprofen    Social History:  The patient  reports that he  has been smoking Cigarettes.  He has a 11.75 pack-year smoking history. He has never used smokeless tobacco. He reports that he drinks alcohol. He reports that he does not use drugs.   Family History:  The patient's family history includes Healthy in his daughter; Heart attack in his father; Hypertension in his father and mother; Stomach cancer in his mother; Stroke in his father.    ROS:  Please see the history of present illness.   Otherwise, review of systems are positive for none.   All other systems are reviewed and negative.    PHYSICAL EXAM: VS:  BP 116/70   Pulse 72   Ht 5' 9" (1.753 m)   Wt 228 lb 9.6 oz (103.7 kg)   BMI 33.76 kg/m  , BMI Body mass index is 33.76 kg/m. GEN: Well nourished, well developed, in no acute distress  HEENT: normal  Neck: no JVD, carotid bruits, or masses Cardiac: RRR; no murmurs, rubs, or gallops,no edema  Respiratory:  clear to auscultation bilaterally, normal work of breathing GI: soft, nontender, nondistended, + BS MS: no deformity or atrophy  Skin: warm and dry, no rash Neuro:  Strength and sensation are intact Psych: euthymic mood, full  affect Vascular: Femoral pulses are normal bilaterally. Distal pulses are not palpable with minimal edema and stasis dermatitis.  EKG:  EKG is not ordered today.   Recent Labs: 07/10/2015: ALT 18 02/13/2016: BUN 14; Creat 1.01; Hemoglobin 17.1; Platelets 253; Potassium 4.6; Sodium 138    Lipid Panel    Component Value Date/Time   CHOL 107 (L) 07/10/2015 1228   TRIG 205 (H) 07/10/2015 1228   HDL 27 (L) 07/10/2015 1228   CHOLHDL 4.0 07/10/2015 1228   VLDL 41 (H) 07/10/2015 1228   LDLCALC 39 07/10/2015 1228   LDLDIRECT 107.0 04/21/2015 1352      Wt Readings from Last 3 Encounters:  03/26/16 228 lb 9.6 oz (103.7 kg)  02/22/16 229 lb (103.9 kg)  02/14/16 225 lb (102.1 kg)         ASSESSMENT AND PLAN:  1.   Peripheral arterial disease:   Recent angiogram showed patent aortobifemoral bypass with moderate disease affecting the left common femoral and ostial left SFA and borderline diffuse disease affecting the right SFA. fortunately, his ABI is normal. I suspect that the majority of his symptoms are related to peripheral neuropathy. Continue medical therapy   2. Coronary artery disease: Status post atherectomy and drug-eluting stent placement to the LAD.  The patient has no angina.  3. Tobacco use: He is down to a quarter pack per day and is trying to quit. I again discussed with him the importance of smoking cessation  4. Hyperlipidemia: Currently on high dose atorvastatin.    Disposition:   FU with me in 12 months  Signed,  Kathlyn Sacramento, MD  03/26/2016 10:24 AM    Joshua [name]

## 2016-03-26 NOTE — Patient Instructions (Signed)

## 2016-03-26 NOTE — Telephone Encounter (Signed)
Left msg to call.

## 2016-04-15 DIAGNOSIS — M542 Cervicalgia: Secondary | ICD-10-CM | POA: Diagnosis not present

## 2016-04-15 DIAGNOSIS — M79671 Pain in right foot: Secondary | ICD-10-CM | POA: Diagnosis not present

## 2016-04-15 DIAGNOSIS — M545 Low back pain: Secondary | ICD-10-CM | POA: Diagnosis not present

## 2016-04-15 DIAGNOSIS — M79672 Pain in left foot: Secondary | ICD-10-CM | POA: Diagnosis not present

## 2016-04-15 DIAGNOSIS — G894 Chronic pain syndrome: Secondary | ICD-10-CM | POA: Diagnosis not present

## 2016-04-15 DIAGNOSIS — Z79891 Long term (current) use of opiate analgesic: Secondary | ICD-10-CM | POA: Diagnosis not present

## 2016-04-16 ENCOUNTER — Ambulatory Visit: Payer: Medicare Other | Admitting: Internal Medicine

## 2016-04-16 ENCOUNTER — Ambulatory Visit: Payer: Medicare Other | Admitting: Family Medicine

## 2016-04-16 DIAGNOSIS — Z79891 Long term (current) use of opiate analgesic: Secondary | ICD-10-CM | POA: Diagnosis not present

## 2016-05-12 ENCOUNTER — Other Ambulatory Visit: Payer: Self-pay | Admitting: Internal Medicine

## 2016-05-13 ENCOUNTER — Other Ambulatory Visit: Payer: Self-pay | Admitting: Internal Medicine

## 2016-05-14 NOTE — Telephone Encounter (Signed)
Please advise if okay to refill gabapentin

## 2016-05-15 ENCOUNTER — Other Ambulatory Visit: Payer: Self-pay | Admitting: Internal Medicine

## 2016-05-15 ENCOUNTER — Telehealth: Payer: Self-pay

## 2016-05-15 DIAGNOSIS — E7849 Other hyperlipidemia: Secondary | ICD-10-CM

## 2016-05-15 MED ORDER — PREGABALIN 50 MG PO CAPS: 50.0000 mg | ORAL_CAPSULE | Freq: Two times a day (BID) | ORAL | 2 refills | Status: DC

## 2016-05-15 NOTE — Telephone Encounter (Signed)
Gabapentin was d/c'd at last visit. I also just D/c'd his amitriptyline per phone note and sent in Lyrica.

## 2016-05-15 NOTE — Telephone Encounter (Signed)
Pt left v/m; pt seen 02/22/16 and started amitriptyline; pt said he cannot take the amitriptyline due to making pt "feel weird" and causing nausea. Pt request different med discussed at 02/22/16 visit. Pt request [facility].

## 2016-05-15 NOTE — Telephone Encounter (Signed)
Will send in Lyrica per request.

## 2016-05-15 NOTE — Telephone Encounter (Signed)
Rx faxed

## 2016-05-16 DIAGNOSIS — M5416 Radiculopathy, lumbar region: Secondary | ICD-10-CM | POA: Diagnosis not present

## 2016-05-16 DIAGNOSIS — G894 Chronic pain syndrome: Secondary | ICD-10-CM | POA: Diagnosis not present

## 2016-05-16 DIAGNOSIS — M79671 Pain in right foot: Secondary | ICD-10-CM | POA: Diagnosis not present

## 2016-05-16 DIAGNOSIS — M79605 Pain in left leg: Secondary | ICD-10-CM | POA: Diagnosis not present

## 2016-05-16 DIAGNOSIS — M79604 Pain in right leg: Secondary | ICD-10-CM | POA: Diagnosis not present

## 2016-05-16 DIAGNOSIS — Z79891 Long term (current) use of opiate analgesic: Secondary | ICD-10-CM | POA: Diagnosis not present

## 2016-05-17 ENCOUNTER — Encounter (INDEPENDENT_AMBULATORY_CARE_PROVIDER_SITE_OTHER): Payer: Self-pay

## 2016-05-17 ENCOUNTER — Other Ambulatory Visit: Payer: Medicare Other

## 2016-05-17 NOTE — Addendum Note (Signed)
Addended by: Ellamae Sia on: 05/17/2016 10:10 AM   Modules accepted: Orders

## 2016-05-20 DIAGNOSIS — M5441 Lumbago with sciatica, right side: Secondary | ICD-10-CM | POA: Diagnosis not present

## 2016-06-04 DIAGNOSIS — M5441 Lumbago with sciatica, right side: Secondary | ICD-10-CM | POA: Diagnosis not present

## 2016-06-06 DIAGNOSIS — M5441 Lumbago with sciatica, right side: Secondary | ICD-10-CM | POA: Diagnosis not present

## 2016-06-06 DIAGNOSIS — M5126 Other intervertebral disc displacement, lumbar region: Secondary | ICD-10-CM | POA: Diagnosis not present

## 2016-06-11 DIAGNOSIS — I10 Essential (primary) hypertension: Secondary | ICD-10-CM | POA: Diagnosis not present

## 2016-06-11 DIAGNOSIS — M5441 Lumbago with sciatica, right side: Secondary | ICD-10-CM | POA: Diagnosis not present

## 2016-06-30 ENCOUNTER — Other Ambulatory Visit: Payer: Self-pay | Admitting: Internal Medicine

## 2016-07-15 ENCOUNTER — Other Ambulatory Visit: Payer: Self-pay | Admitting: Internal Medicine

## 2016-07-15 NOTE — Telephone Encounter (Signed)
CPE reminder letter mailed to pt

## 2016-08-06 MED ORDER — TRAZODONE HCL 50 MG PO TABS: 50.0000 mg | ORAL_TABLET | Freq: Every evening | ORAL | 0 refills | Status: DC | PRN

## 2016-08-30 ENCOUNTER — Other Ambulatory Visit: Payer: Self-pay

## 2016-08-30 MED ORDER — PREGABALIN 50 MG PO CAPS: 50.0000 mg | ORAL_CAPSULE | Freq: Two times a day (BID) | ORAL | 1 refills | Status: DC

## 2016-08-30 MED ORDER — PREGABALIN 50 MG PO CAPS
50.0000 mg | ORAL_CAPSULE | Freq: Two times a day (BID) | ORAL | 2 refills | Status: DC
Start: 2016-08-30 — End: 2016-08-30

## 2016-08-30 NOTE — Telephone Encounter (Signed)
RX printed and signed and placed in MYD box 

## 2016-08-30 NOTE — Telephone Encounter (Signed)
Pt request lyrica refilled to [facility]. Last printed # 60 x 2 on 05/15/16.Pt last seen 02/22/16.

## 2016-08-30 NOTE — Addendum Note (Signed)
Addended by: Lurlean Nanny on: 08/30/2016 03:15 PM   Modules accepted: Orders

## 2016-09-11 ENCOUNTER — Other Ambulatory Visit: Payer: Self-pay | Admitting: Internal Medicine

## 2016-09-20 ENCOUNTER — Other Ambulatory Visit: Payer: Self-pay | Admitting: Internal Medicine

## 2016-09-20 NOTE — Telephone Encounter (Signed)
Filled 08/06/2016 #90

## 2016-10-10 ENCOUNTER — Other Ambulatory Visit: Payer: Self-pay

## 2016-10-10 NOTE — Telephone Encounter (Signed)
Pt left v/m requesting refill colchicine to [facility]. Last seen for gout and rx last refilled # 30 on 10/11/15.Please advise.

## 2016-10-11 MED ORDER — COLCHICINE 0.6 MG PO TABS: ORAL_TABLET | ORAL | 0 refills | Status: DC

## 2016-10-14 ENCOUNTER — Telehealth: Payer: Self-pay | Admitting: Radiology

## 2016-10-14 ENCOUNTER — Encounter: Payer: Self-pay | Admitting: Internal Medicine

## 2016-10-14 ENCOUNTER — Ambulatory Visit (INDEPENDENT_AMBULATORY_CARE_PROVIDER_SITE_OTHER): Payer: Medicare Other | Admitting: Internal Medicine

## 2016-10-14 VITALS — BP 118/80 | HR 60 | Temp 98.0°F | Ht 69.0 in | Wt 197.0 lb

## 2016-10-14 DIAGNOSIS — E1142 Type 2 diabetes mellitus with diabetic polyneuropathy: Secondary | ICD-10-CM | POA: Diagnosis not present

## 2016-10-14 DIAGNOSIS — M109 Gout, unspecified: Secondary | ICD-10-CM | POA: Insufficient documentation

## 2016-10-14 DIAGNOSIS — J449 Chronic obstructive pulmonary disease, unspecified: Secondary | ICD-10-CM

## 2016-10-14 DIAGNOSIS — K219 Gastro-esophageal reflux disease without esophagitis: Secondary | ICD-10-CM

## 2016-10-14 DIAGNOSIS — I10 Essential (primary) hypertension: Secondary | ICD-10-CM

## 2016-10-14 DIAGNOSIS — Z0000 Encounter for general adult medical examination without abnormal findings: Secondary | ICD-10-CM | POA: Diagnosis not present

## 2016-10-14 DIAGNOSIS — I2510 Atherosclerotic heart disease of native coronary artery without angina pectoris: Secondary | ICD-10-CM

## 2016-10-14 DIAGNOSIS — F5101 Primary insomnia: Secondary | ICD-10-CM

## 2016-10-14 LAB — COMPREHENSIVE METABOLIC PANEL
ALK PHOS: 68 U/L (ref 39–117)
ALT: 19 U/L (ref 0–53)
AST: 24 U/L (ref 0–37)
Albumin: 4.2 g/dL (ref 3.5–5.2)
BUN: 10 mg/dL (ref 6–23)
CHLORIDE: 101 meq/L (ref 96–112)
CO2: 30 mEq/L (ref 19–32)
Calcium: 9.2 mg/dL (ref 8.4–10.5)
Creatinine, Ser: 1.02 mg/dL (ref 0.40–1.50)
GFR: 79.01 mL/min (ref >60.00)
GLUCOSE: 109 mg/dL — AB (ref 70–99)
POTASSIUM: 3.7 meq/L (ref 3.5–5.1)
Sodium: 139 mEq/L (ref 135–145)
TOTAL PROTEIN: 7.1 g/dL (ref 6.0–8.3)
Total Bilirubin: 0.9 mg/dL (ref 0.2–1.2)

## 2016-10-14 LAB — LIPID PANEL
CHOLESTEROL: 147 mg/dL (ref 0–200)
HDL: 47.5 mg/dL (ref >39.00)
LDL Cholesterol: 75 mg/dL (ref 0–99)
NONHDL: 99.89
Total CHOL/HDL Ratio: 3
Triglycerides: 125 mg/dL (ref 0.0–149.0)
VLDL: 25 mg/dL (ref 0.0–40.0)

## 2016-10-14 LAB — CBC
HEMATOCRIT: 58.1 % — AB (ref 39.0–52.0)
MCHC: 33.4 g/dL (ref 30.0–36.0)
MCV: 93.8 fl (ref 78.0–100.0)
Platelets: 270 10*3/uL (ref 150.0–400.0)
RBC: 6.19 Mil/uL — ABNORMAL HIGH (ref 4.22–5.81)
RDW: 15.8 % — AB (ref 11.5–15.5)
WBC: 12.3 10*3/uL — AB (ref 4.0–10.5)

## 2016-10-14 LAB — HEMOGLOBIN A1C: Hgb A1c MFr Bld: 5.8 % (ref 4.6–6.5)

## 2016-10-14 NOTE — Assessment & Plan Note (Signed)
Continue Colchicine prn

## 2016-10-14 NOTE — Assessment & Plan Note (Signed)
Increase Trazadone to 100 mg QHS prn

## 2016-10-14 NOTE — Assessment & Plan Note (Signed)
Controlled on Bisoprolol, HCTZ and Losartan CMET today

## 2016-10-14 NOTE — Patient Instructions (Signed)

## 2016-10-14 NOTE — Assessment & Plan Note (Signed)
Diet controlled A1C today Encouraged him to consume a low carb diet and continue exercise No microalbumin secondary to ARB therapy Foot exam today Encouraged yearly eye exams Immunizations UTD

## 2016-10-14 NOTE — Progress Notes (Signed)
HPI:  Pt presents to the clinic today for his Medicare Wellness Exam. He is also due to follow up chronic conditions.  Arthritis: Mainly in his back. He is taking Lyrica for radicular pain with good relief.  HLD with CAD: His last LDL was 41, triglycerides 205, 06/2015. He is taking Fish Oil, Lipitor and Plavix as prescribed. He denies myalgias.   COPD: He denies shortness of breath. He continues to smoke. He is taking Flovent daily as prescribed and Albuterol as needed.  GERD: He is not sure what triggers this but reports he has no issues as long as he continue to take the Prilosec dialy.  HTN: His BP today is 118/80/ He is taking Bisoprolol, HCTZ and Losartan as prescribed. He reports cardiology recently took him off Clonidine.  Gout: He reports he recently had a flare in his left ankle. He takes Colchicine prn with good relief.   Insomnia: He has difficulty staying asleep. He averages about 4 hours of sleep per night despite taking the Trazadone.   Past Medical History:  Diagnosis Date  . Arthritis    "back" (05/16/2015)  . CAD in native artery, occluded Lcx and rotational atherectomy to LAD with DES 05/17/15 05/18/2015  . Chronic back pain    "all my back"  . Chronic bronchitis ([place])   . COPD (chronic obstructive pulmonary disease) ([place])   . GERD (gastroesophageal reflux disease)   . H/O blood clots    "had them in my back; put filter in before one of my neck ORs"  . History of gout   . Hyperlipidemia   . Hypertension   . Phlebitis   . Positive TB test   . Seasonal allergies   . Shortness of breath   . Spinal disease     Current Outpatient Prescriptions  Medication Sig Dispense Refill  . aspirin 81 MG chewable tablet Chew 1 tablet (81 mg total) by mouth daily.    [name] atorvastatin (LIPITOR) 80 MG tablet Take 1 tablet (80 mg total) by mouth daily at 6 PM. 90 tablet 3  . bisoprolol (ZEBETA) 5 MG tablet Take 0.5 tablets (2.5 mg total) by mouth daily. 45 tablet 3  . Blood Glucose  Monitoring Suppl (ONETOUCH VERIO) w/Device KIT 1 Device by Does not apply route once. 1 kit 0  . cloNIDine (CATAPRES) 0.3 MG tablet Take 1 tablet (0.3 mg total) by mouth 2 (two) times daily. 180 tablet 3  . clopidogrel (PLAVIX) 75 MG tablet Take 1 tablet (75 mg total) by mouth daily. 94 tablet 3  . colchicine 0.6 MG tablet Take 2 tablets now, then 1 tablet after 1 hour. Can take 1 tab BID thereafter. 30 tablet 0  . fluticasone (FLOVENT HFA) 44 MCG/ACT inhaler Inhale 1 puff into the lungs 2 (two) times daily as needed (shortness of breath).    [name] glucose blood (ONETOUCH VERIO) test strip 1 each by Other route 3 (three) times daily. Use as instructed 100 each 11  . hydrochlorothiazide (HYDRODIURIL) 25 MG tablet Take 1 tablet (25 mg total) by mouth daily. 90 tablet 3  . levocetirizine (XYZAL) 5 MG tablet TAKE 1 TABLET BY MOUTH  EVERY EVENING 90 tablet 1  . losartan (COZAAR) 25 MG tablet Take 0.5 tablets (12.5 mg total) by mouth daily. 45 tablet 3  . nitroGLYCERIN (NITROSTAT) 0.4 MG SL tablet Place 1 tablet (0.4 mg total) under the tongue every 5 (five) minutes as needed for chest pain. 25 tablet 4  . Omega-3 Fatty Acids (  FISH OIL) 1200 MG CAPS Take 1,200 mg by mouth daily.     [name] omeprazole (PRILOSEC) 20 MG capsule TAKE 1 CAPSULE BY MOUTH  DAILY 90 capsule 0  . ONE TOUCH LANCETS MISC 1 each by Does not apply route 3 (three) times daily. 200 each 5  . oxyCODONE-acetaminophen (PERCOCET) 7.5-325 MG tablet Take 1 tablet by mouth 3 (three) times daily as needed for moderate pain.     . pregabalin (LYRICA) 50 MG capsule Take 1 capsule (50 mg total) by mouth 2 (two) times daily. 180 capsule 1  . PROAIR HFA 108 (90 Base) MCG/ACT inhaler USE 2 PUFFS EVERY 6 HOURS  AS NEEDED FOR WHEEZING OR  SHORTNESS OF BREATH 34 g 3  . traZODone (DESYREL) 50 MG tablet Take 1 tablet (50 mg total) by mouth at bedtime as needed. 90 tablet 0   No current facility-administered medications for this visit.     Allergies  Allergen  Reactions  . Lisinopril Hives and Itching  . Amitriptyline Other (See Comments)    Makes pt feel "weird" and nausea.  . Contrast Media [Iodinated Diagnostic Agents] Hives and Itching    Pt can use if taking benadryl   . Ibuprofen Other (See Comments)    Internal bleeding    Family History  Problem Relation Age of Onset  . Stomach cancer Mother        Deceased, 7s  . Hypertension Mother   . Hypertension Father   . Stroke Father   . Heart attack Father        Deceased, 77  . Healthy Daughter   . Diabetes Neg Hx   . Early death Neg Hx     Social History   Social History  . Marital status: Married    Spouse name: N/A  . Number of children: N/A  . Years of education: 7   Occupational History  . Disabled Disabled   Social History Main Topics  . Smoking status: Current Every Day Smoker    Packs/day: 0.25    Years: 47.00    Types: Cigarettes  . Smokeless tobacco: Never Used  . 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 Drug use: No  . Sexual activity: Not Currently   Other Topics Concern  . Not on file   Social History Narrative   Regular exercise-no   Caffeine Use-yes   Lives with wife in a one story home. Has 2 children.  On disability for low back pain.  Used to work as a Games developer, lasted worked in 2007.     Education: 7th grade.    Hospitiliaztions: None  Health Maintenance:    Flu: 01/2016  Tetanus: 08/2012  Pneumovax: 02/2015  PSA: 09/2015  Colon Screening: 08/2008  Eye Doctor:  Dental Exam:   Providers:   PCP: Webb Silversmith, NP-C  Cardiologist: Dr. Fletcher [name]    I have personally reviewed and have noted:  1. The patient's medical and social history 2. Their use of alcohol, tobacco or illicit drugs 3. Their current medications and supplements 4. The patient's functional ability including ADL's, fall risks, home safety risks and hearing or visual  impairment. 5. Diet and physical activities 6. Evidence for depression or mood disorder  Subjective:   Review of Systems:   Constitutional: Pt reports intentional weight loss. Denies fever, malaise, fatigue, headache.  HEENT: Denies eye pain, eye redness, ear pain,  ringing in the ears, wax buildup, runny nose, nasal congestion, bloody nose, or sore throat. Respiratory: Denies difficulty breathing, shortness of breath, cough or sputum production.   Cardiovascular: Denies chest pain, chest tightness, palpitations or swelling in the hands or feet.  Gastrointestinal: Denies abdominal pain, bloating, constipation, diarrhea or blood in the stool.  GU: Denies urgency, frequency, pain with urination, burning sensation, blood in urine, odor or discharge. Musculoskeletal: Pt reports chronic back pain. Denies decrease in range of motion, difficulty with gait, muscle pain or joint swelling.  Skin: Denies redness, rashes, lesions or ulcercations.  Neurological: Denies dizziness, difficulty with memory, difficulty with speech or problems with balance and coordination.  Psych: Denies anxiety, depression, SI/HI.  No other specific complaints in a complete review of systems (except as listed in HPI above).  Objective:  PE:   BP 118/80   Pulse 60   Temp 98 F (36.7 C) (Oral)   Ht '5\' 9"'  (1.753 m)   Wt 197 lb (89.4 kg)   SpO2 96%   BMI 29.09 kg/m   Wt Readings from Last 3 Encounters:  03/26/16 228 lb 9.6 oz (103.7 kg)  02/22/16 229 lb (103.9 kg)  02/14/16 225 lb (102.1 kg)     General: Appears his stated age, chronically ill appearing, in NAD. Skin: Warm, dry and intact. HEENT: Head: normal shape and size; Eyes: sclera white, no icterus, conjunctiva pink, PERRLA and EOMs intact; Ears: Tm's gray and intact, normal light reflex; Throat/Mouth: Teeth present, mucosa pink and moist, no exudate, lesions or ulcerations noted.  Neck: Neck supple, trachea midline. No masses, lumps or thyromegaly  present.  Cardiovascular: Normal rate and rhythm. S1,S2 noted.  No JVD or BLE edema. No carotid bruits noted. Pulmonary/Chest: Normal effort and positive vesicular breath sounds. No respiratory distress. No wheezes, rales or ronchi noted.  Abdomen: Soft and nontender. Normal bowel sounds. No distention or masses noted. Liver, spleen and kidneys non palpable. Musculoskeletal:  Strength 5/5 BUE/BLE.  Neurological: Alert and oriented. Cranial nerves II-XII grossly intact. Coordination normal.  Psychiatric: Mood and affect normal. Behavior is normal. Judgment and thought content normal.    BMET    Component Value Date/Time   NA 138 02/13/2016 0949   K 4.6 02/13/2016 0949   CL 100 02/13/2016 0949   CO2 29 02/13/2016 0949   GLUCOSE 109 (H) 02/13/2016 0949   BUN 14 02/13/2016 0949   CREATININE 1.01 02/13/2016 0949   CALCIUM 9.5 02/13/2016 0949   GFRNONAA >60 05/18/2015 0725   GFRAA >60 05/18/2015 0725    Lipid Panel     Component Value Date/Time   CHOL 107 (L) 07/10/2015 1228   TRIG 205 (H) 07/10/2015 1228   HDL 27 (L) 07/10/2015 1228   CHOLHDL 4.0 07/10/2015 1228   VLDL 41 (H) 07/10/2015 1228   LDLCALC 39 07/10/2015 1228    CBC    Component Value Date/Time   WBC 10.1 02/13/2016 0949   RBC 5.73 02/13/2016 0949   HGB 17.1 02/13/2016 0949   HCT 50.3 (H) 02/13/2016 0949   PLT 253 02/13/2016 0949   MCV 87.8 02/13/2016 0949   MCH 29.8 02/13/2016 0949   MCHC 34.0 02/13/2016 0949   RDW 15.0 02/13/2016 0949   LYMPHSABS 3.1 05/15/2015 1017   MONOABS 1.2 (H) 05/15/2015 1017   EOSABS 0.3 05/15/2015 1017   BASOSABS 0.1 05/15/2015 1017    Hgb A1C Lab Results  Component Value Date   HGBA1C 6.2 01/31/2015      Assessment and  Plan:   Medicare Annual Wellness Visit:  Diet: He does eat meat. He consumes fruits and veggies daily. He does eat fried foods. He drinks water, sweet tea. Physical activity: He does yard work as much as possible but can not exercise routinely  secondary to his chronic back pain.  Depression/mood screen: Negative Hearing: Intact to whispered voice Visual acuity: Grossly normal, performs annual eye exam  ADLs: Capable Fall risk: None Home safety: Good Cognitive evaluation: Intact to orientation, naming, recall and repetition EOL planning: No adv directives, full code/ I agree  Preventative Medicine: Encouraged him to get a flu shot in the fall. Tetanus and pneumovax UTD. PSA today. Colon screening UTD. Encouraged him to consume a balanced diet and exercise regimen. Advised him to see an eye doctor and dentist annually. Will check CBC, CMET ,Lipid and A1C today.   Next appointment: 1 year, Medicare Wellness Exam   Webb Silversmith, NP

## 2016-10-14 NOTE — Assessment & Plan Note (Signed)
Controlled on Prilosec Will monitor

## 2016-10-14 NOTE — Assessment & Plan Note (Signed)
Encouraged smoking cessation Continue Flovent

## 2016-10-14 NOTE — Assessment & Plan Note (Signed)
No angina Continue Lipitor, Bisoprolol and Plavix

## 2016-10-14 NOTE — Telephone Encounter (Signed)
[facility] called a critical HGB - 19.4. Results called to Webb Silversmith, NP

## 2016-10-15 NOTE — Telephone Encounter (Signed)
noted 

## 2016-10-18 NOTE — Telephone Encounter (Signed)
Caller Name: Anyelo Mccue Relationship to Patient:  Best Number: [phone] Pharmacy: [facility]  Reason- callback for lab results

## 2016-10-18 NOTE — Telephone Encounter (Signed)
Attempted to contact pt; lmom requesting a call back. See results note

## 2016-10-21 ENCOUNTER — Other Ambulatory Visit: Payer: Self-pay | Admitting: Internal Medicine

## 2016-10-21 DIAGNOSIS — D751 Secondary polycythemia: Secondary | ICD-10-CM

## 2016-11-11 ENCOUNTER — Telehealth: Payer: Self-pay

## 2016-11-11 NOTE — Telephone Encounter (Signed)
Can you call and see why he wants to increase dose. Is he having trouble falling asleep. Is it not lasting all night?

## 2016-11-11 NOTE — Telephone Encounter (Signed)
Pt left v/m requesting rx for trazodone 50 mg to [facility]. Pt requesting to increase dosage. Last annual 10/14/16. Last refilled trazodone 50 mg # 90 on 08/06/16.

## 2016-11-12 MED ORDER — TRAZODONE HCL 50 MG PO TABS: 75.0000 mg | ORAL_TABLET | Freq: Every evening | ORAL | 0 refills | Status: DC | PRN

## 2016-11-12 NOTE — Telephone Encounter (Signed)
New Rx sent to [facility]

## 2016-11-12 NOTE — Telephone Encounter (Signed)
Pt reports he is having difficulty falling and staying asleep on the 50mg ... Please advise

## 2016-11-12 NOTE — Addendum Note (Signed)
Addended by: Lurlean Nanny on: 11/12/2016 04:02 PM   Modules accepted: Orders

## 2016-11-12 NOTE — Telephone Encounter (Signed)
Increase to 1.5 tabs qhs prn

## 2016-11-14 ENCOUNTER — Other Ambulatory Visit: Payer: Self-pay | Admitting: Internal Medicine

## 2016-11-15 MED ORDER — TRAZODONE HCL 50 MG PO TABS: 75.0000 mg | ORAL_TABLET | Freq: Every evening | ORAL | 0 refills | Status: DC | PRN

## 2016-11-15 NOTE — Addendum Note (Signed)
Addended by: Lurlean Nanny on: 11/15/2016 08:14 AM   Modules accepted: Orders

## 2016-11-25 ENCOUNTER — Telehealth: Payer: Self-pay | Admitting: Hematology and Oncology

## 2016-11-25 ENCOUNTER — Ambulatory Visit (HOSPITAL_BASED_OUTPATIENT_CLINIC_OR_DEPARTMENT_OTHER): Payer: Medicare Other

## 2016-11-25 ENCOUNTER — Ambulatory Visit (HOSPITAL_BASED_OUTPATIENT_CLINIC_OR_DEPARTMENT_OTHER): Payer: Medicare Other | Admitting: Hematology and Oncology

## 2016-11-25 VITALS — BP 133/70 | HR 61 | Temp 98.2°F | Resp 18 | Ht 69.0 in | Wt 192.2 lb

## 2016-11-25 DIAGNOSIS — R1319 Other dysphagia: Secondary | ICD-10-CM

## 2016-11-25 DIAGNOSIS — R1310 Dysphagia, unspecified: Secondary | ICD-10-CM

## 2016-11-25 DIAGNOSIS — M7580 Other shoulder lesions, unspecified shoulder: Secondary | ICD-10-CM

## 2016-11-25 DIAGNOSIS — D751 Secondary polycythemia: Secondary | ICD-10-CM

## 2016-11-25 DIAGNOSIS — R1314 Dysphagia, pharyngoesophageal phase: Secondary | ICD-10-CM | POA: Diagnosis not present

## 2016-11-25 HISTORY — DX: Other shoulder lesions, unspecified shoulder: M75.80

## 2016-11-25 LAB — COMPREHENSIVE METABOLIC PANEL
ALBUMIN: 4.2 g/dL (ref 3.5–5.0)
ALK PHOS: 88 U/L (ref 40–150)
ALT: 20 U/L (ref 0–55)
ANION GAP: 12 meq/L — AB (ref 3–11)
AST: 24 U/L (ref 5–34)
BILIRUBIN TOTAL: 0.84 mg/dL (ref 0.20–1.20)
BUN: 12.6 mg/dL (ref 7.0–26.0)
CALCIUM: 10.3 mg/dL (ref 8.4–10.4)
CO2: 27 mEq/L (ref 22–29)
CREATININE: 0.9 mg/dL (ref 0.7–1.3)
Chloride: 102 mEq/L (ref 98–109)
EGFR: 88 mL/min/{1.73_m2} — ABNORMAL LOW (ref >90)
Glucose: 101 mg/dl (ref 70–140)
Potassium: 4.3 mEq/L (ref 3.5–5.1)
Sodium: 140 mEq/L (ref 136–145)
TOTAL PROTEIN: 8 g/dL (ref 6.4–8.3)

## 2016-11-25 LAB — LACTATE DEHYDROGENASE: LDH: 206 U/L (ref 125–245)

## 2016-11-25 LAB — CBC WITH DIFFERENTIAL/PLATELET
BASO%: 0.3 % (ref 0.0–2.0)
BASOS ABS: 0 10*3/uL (ref 0.0–0.1)
EOS ABS: 0.3 10*3/uL (ref 0.0–0.5)
EOS%: 2.6 % (ref 0.0–7.0)
HEMATOCRIT: 54.8 % — AB (ref 38.4–49.9)
HEMOGLOBIN: 19.2 g/dL — AB (ref 13.0–17.1)
LYMPH#: 2.5 10*3/uL (ref 0.9–3.3)
LYMPH%: 19.6 % (ref 14.0–49.0)
MCH: 32.3 pg (ref 27.2–33.4)
MCHC: 35 g/dL (ref 32.0–36.0)
MCV: 92.3 fL (ref 79.3–98.0)
MONO#: 1.3 10*3/uL — AB (ref 0.1–0.9)
MONO%: 10.3 % (ref 0.0–14.0)
NEUT%: 67.2 % (ref 39.0–75.0)
NEUTROS ABS: 8.7 10*3/uL — AB (ref 1.5–6.5)
Platelets: 229 10*3/uL (ref 140–400)
RBC: 5.94 10*6/uL — ABNORMAL HIGH (ref 4.20–5.82)
RDW: 15 % — AB (ref 11.0–14.6)
WBC: 12.9 10*3/uL — AB (ref 4.0–10.3)

## 2016-11-25 NOTE — Telephone Encounter (Signed)
Gave patient AVS and calendar of upcoming September appointments.

## 2016-11-26 ENCOUNTER — Encounter: Payer: Self-pay | Admitting: Hematology and Oncology

## 2016-11-26 LAB — ERYTHROPOIETIN: ERYTHROPOIETIN: 5.6 m[IU]/mL (ref 2.6–18.5)

## 2016-11-28 DIAGNOSIS — D751 Secondary polycythemia: Secondary | ICD-10-CM | POA: Insufficient documentation

## 2016-11-28 NOTE — Assessment & Plan Note (Signed)
Patient is now reporting symptoms of early satiety, but also placed significant dysphasia with food being stuck in the lower esophagus by report. In the context of 2 years of duration of symptoms as well as history of smoking, these findings are worrisome for possible development of esophageal stricture or esophageal malignancy.  Plan: --Consult GI for EGD --CT C/A/P

## 2016-11-28 NOTE — Assessment & Plan Note (Signed)
61yo male with mild-moderate tobacco abuse history and known diagnosis of COPD/chronic bronchitis referred to our clinic for evaluation of elevated hemoglobin with suspicion for possible polycythemia development.  Review of the trends in hemoglobin demonstrates normal hemoglobin values to half years ago with stage arise in both the hemoglobin and white blood cell count which are suspicious for possible myeloproliferative neoplasm development. Differential diagnosis includes progressive pulmonary dysfunction with reactive change.  Plan: --Labs today including erythropoietin level and smear review --Will P mutation analysis for Jak2, CALR, MPL etc if erythropoietin is low or low normal.

## 2016-11-28 NOTE — Progress Notes (Signed)
[facility] New Visit:  Assessment: Erythrocytosis 61yo male with mild-moderate tobacco abuse history and known diagnosis of COPD/chronic bronchitis referred to our clinic for evaluation of elevated hemoglobin with suspicion for possible polycythemia development.  Review of the trends in hemoglobin demonstrates normal hemoglobin values to half years ago with stage arise in both the hemoglobin and white blood cell count which are suspicious for possible myeloproliferative neoplasm development. Differential diagnosis includes progressive pulmonary dysfunction with reactive change.  Plan: --Labs today including erythropoietin level and smear review --Will P mutation analysis for Jak2, CALR, MPL etc if erythropoietin is low or low normal.    Esophageal dysphagia Patient is now reporting symptoms of early satiety, but also placed significant dysphasia with food being stuck in the lower esophagus by report. In the context of 2 years of duration of symptoms as well as history of smoking, these findings are worrisome for possible development of esophageal stricture or esophageal malignancy.  Plan: --Consult GI for EGD --CT C/A/P  Return to clinic in 1 week to review the findings of the imaging and laboratory evaluation.  Voice recognition software was used and creation of this note. Despite my best effort at editing the text, some misspelling/errors may have occurred.  Orders Placed This Encounter  Procedures  . CT Chest W Contrast    Standing Status:   Future    Standing Expiration Date:   11/25/2017    Order Specific Question:   If indicated for the ordered procedure, I authorize the administration of contrast media per Radiology protocol    Answer:   Yes    Order Specific Question:   Preferred imaging location?    Answer:   [facility]    Order Specific Question:   Radiology Contrast Protocol - do NOT remove file path    Answer:    \\[email or web address]    Order Specific Question:   Reason for Exam additional comments    Answer:   Severe dysphagia -- please eval for poss esophageal thickening  . CT Abdomen Pelvis W Contrast    Standing Status:   Future    Standing Expiration Date:   11/25/2017    Order Specific Question:   If indicated for the ordered procedure, I authorize the administration of contrast media per Radiology protocol    Answer:   Yes    Order Specific Question:   Preferred imaging location?    Answer:   [facility]    Order Specific Question:   Radiology Contrast Protocol - do NOT remove file path    Answer:   \\[email or web address]  . CBC with Differential    Standing Status:   Future    Number of Occurrences:   1    Standing Expiration Date:   11/25/2017  . Comprehensive metabolic panel    Standing Status:   Future    Number of Occurrences:   1    Standing Expiration Date:   11/25/2017  . Lactate dehydrogenase (LDH)    Standing Status:   Future    Number of Occurrences:   1    Standing Expiration Date:   11/25/2017  . Erythropoietin    Standing Status:   Future    Number of Occurrences:   1    Standing Expiration Date:   11/25/2017  . Ambulatory referral to Gastroenterology    Referral Priority:   Routine    Referral Type:   Consultation    Referral Reason:   Specialty Services  Required    Number of Visits Requested:   1    All questions were answered.  . The patient knows to call the clinic with any problems, questions or concerns.  This note was electronically signed.    History of Presenting Illness Joshua [name] is a 61 y.o. male referred [facility] Suspected polycythemia. His past medical history is outlined below. He does have a number of symptoms which are fully described in the review of systems below. Note, he has been experiencing dysphasia, then dysphasia for the past 2 years and also early satiety for  approximately a year. Patient denies unexpected weight loss. He denies fevers, chills, night sweats. Denies redness or swelling in the hands. Denies flushing of the face, headaches. Patient is currently smoking half a pack a day, previously smoked approximately 1 pack per day for 10 years, but subsequently reduced the amount and is thinking about quitting all together.  Oncological/hematological History: --Labs, 10/01/12: WBC 10.0, Hgb 14.5, Hct 42.0, MCV 89.9, MCH 31.0, RDW 13.5, Plt 282;  --Labs, 07/27/14: WBC 13.5, Hgb 14.9, Hct 44.5, MCV 78.3, MCH ..., RDW 15.9, Plt 370 --Sleep Study, 05/16/15: Positive for restless legs and negative for sleep apnea evidence. --Labs, 02/13/16: WBC 10.1, Hgb 17.1, Hct 50.3, MCV 87.8, MCH 29.8, RDW 15.0, Plt 253 --Labs, 10/14/16: WBC 12.3, Hgb 19.4, Hct 58.1, MCV 93.8, MCH 33.4, RDW 15.8, Plt 270   Medical History: Past Medical History:  Diagnosis Date  . AC (acromioclavicular) joint bone spurs, unspecified laterality 11/25/2016  . Arthritis    "back" (05/16/2015)  . CAD in native artery, occluded Lcx and rotational atherectomy to LAD with DES 05/17/15 05/18/2015  . Chronic back pain    "all my back"  . Chronic bronchitis ([place])   . COPD (chronic obstructive pulmonary disease) ([place])   . GERD (gastroesophageal reflux disease)   . H/O blood clots    "had them in my back; put filter in before one of my neck ORs"  . History of gout   . Hyperlipidemia   . Hypertension   . Phlebitis   . Positive TB test   . Seasonal allergies   . Shortness of breath   . Spinal disease     Surgical History: Past Surgical History:  Procedure Laterality Date  . ANTERIOR CERVICAL DECOMP/DISCECTOMY FUSION  ~ 2001-2009 X 3  . ANTERIOR CERVICAL DECOMP/DISCECTOMY FUSION N/A 10/06/2012   Procedure: ANTERIOR CERVICAL DECOMPRESSION/DISCECTOMY FUSION 1 LEVEL;  Surgeon: [name], MD;  Location: [facility];  Service: Neurosurgery;  Laterality: N/A;  C7T1 anterior cervical  decompression with fusion plating and bonegraft   . BACK SURGERY    . CARDIAC CATHETERIZATION  05/16/2015  . CARDIAC CATHETERIZATION N/A 05/16/2015   Procedure: Left Heart Cath and Coronary Angiography;  Surgeon: [name], MD;  Location: [facility];  Service: Cardiovascular;  Laterality: N/A;  . CARDIAC CATHETERIZATION N/A 05/16/2015   Procedure: Coronary Balloon Angioplasty;  Surgeon: [name], MD;  Location: [facility];  Service: Cardiovascular;  Laterality: N/A;  . CARDIAC CATHETERIZATION N/A 05/17/2015   Procedure: Coronary Stent Intervention Rotoblater;  Surgeon: [name], MD;  Location: [facility];  Service: Cardiovascular;  Laterality: N/A;  . ELBOW SURGERY Left 2016   "had bone pieces removed"  . EXCISIONAL HEMORRHOIDECTOMY    . FEMORAL BYPASS  ~ 2010  . IVC FILTER PLACEMENT ([place] [facility])  2010   "had blood clots in my  back"  . PERIPHERAL VASCULAR CATHETERIZATION N/A 02/14/2016   Procedure: Abdominal Aortogram w/Lower Extremity;  Surgeon: Wellington Hampshire, MD;  Location: [facility];  Service: Cardiovascular;  Laterality: N/A;  . POSTERIOR LAMINECTOMY / DECOMPRESSION LUMBAR SPINE  2006   "bone spurs"    Family History: Family History  Problem Relation Age of Onset  . Stomach cancer Mother        Deceased, 6s  . Hypertension Mother   . Hypertension Father   . Stroke Father   . Heart attack Father        Deceased, 44  . Aneurysm Brother   . Healthy Daughter   . Healthy Maternal Grandmother   . Leukemia Grandchild   . Diabetes Neg Hx   . Early death Neg Hx     Social History: Social History   Social History  . Marital status: Married    Spouse name: N/A  . Number of children: N/A  . Years of education: 7   Occupational History  . Disabled Disabled   Social History Main Topics  . Smoking status: Current Every Day Smoker    Packs/day: 0.50    Years: 47.00    Types: Cigarettes  . Smokeless tobacco: Never Used  .  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 Drug use: No  . Sexual activity: Not Currently   Other Topics Concern  . Not on file   Social History Narrative   Regular exercise-no   Caffeine Use-yes   Lives with wife in a one story home. Has 2 children.  On disability for low back pain.  Used to work as a Games developer, lasted worked in 2007.     Education: 7th grade.    Allergies: Allergies  Allergen Reactions  . Lisinopril Hives and Itching  . Amitriptyline Other (See Comments)    Makes pt feel "weird" and nausea.  . Contrast Media [Iodinated Diagnostic Agents] Hives and Itching    Pt can use if taking benadryl   . Ibuprofen Other (See Comments)    Internal bleeding    Medications:  Current Outpatient Prescriptions  Medication Sig Dispense Refill  . aspirin 81 MG chewable tablet Chew 1 tablet (81 mg total) by mouth daily.    [name] atorvastatin (LIPITOR) 80 MG tablet Take 1 tablet (80 mg total) by mouth daily at 6 PM. 90 tablet 3  . bisoprolol (ZEBETA) 5 MG tablet Take 0.5 tablets (2.5 mg total) by mouth daily. 45 tablet 3  . Blood Glucose Monitoring Suppl (ONETOUCH VERIO) w/Device KIT 1 Device by Does not apply route once. 1 kit 0  . clopidogrel (PLAVIX) 75 MG tablet Take 1 tablet (75 mg total) by mouth daily. 94 tablet 3  . colchicine 0.6 MG tablet Take 2 tablets now, then 1 tablet after 1 hour. Can take 1 tab BID thereafter. 30 tablet 0  . fluticasone (FLOVENT HFA) 44 MCG/ACT inhaler Inhale 1 puff into the lungs 2 (two) times daily as needed (shortness of breath).    [name] glucose blood (ONETOUCH VERIO) test strip 1 each by Other route 3 (three) times daily. Use as instructed 100 each 11  . hydrochlorothiazide (HYDRODIURIL) 25 MG tablet Take 1 tablet (25 mg total) by mouth daily. 90 tablet 3  . levocetirizine (XYZAL) 5 MG tablet TAKE 1 TABLET BY MOUTH  EVERY EVENING 90 tablet 1  . losartan (COZAAR)  25  MG tablet Take 0.5 tablets (12.5 mg total) by mouth daily. 45 tablet 3  . nitroGLYCERIN (NITROSTAT) 0.4 MG SL tablet Place 1 tablet (0.4 mg total) under the tongue every 5 (five) minutes as needed for chest pain. 25 tablet 4  . Omega-3 Fatty Acids (FISH OIL) 1200 MG CAPS Take 1,200 mg by mouth daily.     [name] omeprazole (PRILOSEC) 20 MG capsule TAKE 1 CAPSULE BY MOUTH  DAILY 90 capsule 0  . ONE TOUCH LANCETS MISC 1 each by Does not apply route 3 (three) times daily. 200 each 5  . pregabalin (LYRICA) 50 MG capsule Take 1 capsule (50 mg total) by mouth 2 (two) times daily. 180 capsule 1  . PROAIR HFA 108 (90 Base) MCG/ACT inhaler USE 2 PUFFS EVERY 6 HOURS  AS NEEDED FOR WHEEZING OR  SHORTNESS OF BREATH 34 g 3  . traZODone (DESYREL) 50 MG tablet Take 1.5 tablets (75 mg total) by mouth at bedtime as needed. 135 tablet 0   No current facility-administered medications for this visit.     Review of Systems: Review of Systems  Constitutional: Negative.  Negative for appetite change, chills, diaphoresis, fatigue, fever and unexpected weight change.  HENT:   Positive for trouble swallowing.        Patient describes swallowing problems for the past 2 years with solid food worse than liquids. Fluid appears to get stuck in the lower chest and drinking fluids after food helps significantly.  Eyes: Negative.   Respiratory: Negative.   Cardiovascular: Negative.   Gastrointestinal: Negative for abdominal distention, abdominal pain, blood in stool, constipation, diarrhea, nausea, rectal pain and vomiting.       Patient does notice early satiety, presently he is down to eating 1 meal per day over the past year  Endocrine: Negative.   Genitourinary: Negative.    Musculoskeletal: Negative.  Negative for gait problem.  Skin: Negative.   Neurological: Positive for numbness. Negative for dizziness, extremity weakness, gait problem, headaches, light-headedness, seizures and speech difficulty.       Patient reports a  burning sensation in the right forearm and arm for the past month. 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  Hematological: Negative.   Psychiatric/Behavioral: Negative.      PHYSICAL EXAMINATION Blood pressure 133/70, pulse 61, temperature 98.2 F (36.8 C), temperature source Oral, resp. rate 18, height '5\' 9"'  (1.753 m), weight 192 lb 3.2 oz (87.2 kg), SpO2 98 %.  ECOG PERFORMANCE STATUS: 1 - Symptomatic but completely ambulatory  Physical Exam  Constitutional: He is oriented to person, place, and time and well-developed, well-nourished, and in no distress. No distress.  HENT:  Head: Normocephalic and atraumatic.  Mouth/Throat: Oropharynx is clear and moist. No oropharyngeal exudate.  Eyes: Pupils are equal, round, and reactive to light. Conjunctivae are normal. No scleral icterus.  Neck: Normal range of motion. Neck supple. No thyromegaly present.  Cardiovascular: Normal rate, regular rhythm and normal heart sounds.   No murmur heard. Pulmonary/Chest: Effort normal. He has wheezes.  Diffuse expiratory wheezing noted. No dullness to percussion  Abdominal: Soft. Bowel sounds are normal. He exhibits no distension and no mass. There is no tenderness. There is no rebound and no guarding.  Musculoskeletal: Normal range of motion. He exhibits no edema or deformity.  Lymphadenopathy:    He has no cervical adenopathy.  Neurological: He is alert and oriented to person, place, and time. He displays normal reflexes. No  cranial nerve deficit.  Skin: Skin is warm and dry. No rash noted. He is not diaphoretic. No erythema. No pallor.     LABORATORY DATA: I have personally reviewed the data as listed: Appointment on 11/25/2016  Component Date Value Ref Range Status  . WBC 11/25/2016 12.9* 4.0 - 10.3 10e3/uL Final  . NEUT# 11/25/2016 8.7* 1.5 - 6.5 10e3/uL Final  . HGB 11/25/2016 19.2* 13.0 - 17.1 g/dL Final  . HCT 11/25/2016 54.8*  38.4 - 49.9 % Final  . Platelets 11/25/2016 229  140 - 400 10e3/uL Final  . MCV 11/25/2016 92.3  79.3 - 98.0 fL Final  . MCH 11/25/2016 32.3  27.2 - 33.4 pg Final  . MCHC 11/25/2016 35.0  32.0 - 36.0 g/dL Final  . RBC 11/25/2016 5.94* 4.20 - 5.82 10e6/uL Final  . RDW 11/25/2016 15.0* 11.0 - 14.6 % Final  . lymph# 11/25/2016 2.5  0.9 - 3.3 10e3/uL Final  . MONO# 11/25/2016 1.3* 0.1 - 0.9 10e3/uL Final  . Eosinophils Absolute 11/25/2016 0.3  0.0 - 0.5 10e3/uL Final  . Basophils Absolute 11/25/2016 0.0  0.0 - 0.1 10e3/uL Final  . NEUT% 11/25/2016 67.2  39.0 - 75.0 % Final  . LYMPH% 11/25/2016 19.6  14.0 - 49.0 % Final  . MONO% 11/25/2016 10.3  0.0 - 14.0 % Final  . EOS% 11/25/2016 2.6  0.0 - 7.0 % Final  . BASO% 11/25/2016 0.3  0.0 - 2.0 % Final  . Sodium 11/25/2016 140  136 - 145 mEq/L Final  . Potassium 11/25/2016 4.3  3.5 - 5.1 mEq/L Final  . Chloride 11/25/2016 102  98 - 109 mEq/L Final  . CO2 11/25/2016 27  22 - 29 mEq/L Final  . Glucose 11/25/2016 101  70 - 140 mg/dl Final   Glucose reference range is for nonfasting patients. Fasting glucose reference range is 70- 100.  [name] BUN 11/25/2016 12.6  7.0 - 26.0 mg/dL Final  . Creatinine 11/25/2016 0.9  0.7 - 1.3 mg/dL Final  . Total Bilirubin 11/25/2016 0.84  0.20 - 1.20 mg/dL Final  . Alkaline Phosphatase 11/25/2016 88  40 - 150 U/L Final  . AST 11/25/2016 24  5 - 34 U/L Final  . ALT 11/25/2016 20  0 - 55 U/L Final  . Total Protein 11/25/2016 8.0  6.4 - 8.3 g/dL Final  . Albumin 11/25/2016 4.2  3.5 - 5.0 g/dL Final  . Calcium 11/25/2016 10.3  8.4 - 10.4 mg/dL Final  . Anion Gap 11/25/2016 12* 3 - 11 mEq/L Final  . EGFR 11/25/2016 88* >90 ml/min/1.73 m2 Final   eGFR is calculated using the CKD-EPI Creatinine Equation (2009)  . LDH 11/25/2016 206  125 - 245 U/L Final  . Erythropoietin 11/25/2016 5.6  2.6 - 18.5 mIU/mL Final   Beck[employer] Coul[employer] UniCel DxI 800 Immunoassay System       Ardath Sax, MD

## 2016-12-02 ENCOUNTER — Encounter (HOSPITAL_COMMUNITY): Payer: Self-pay

## 2016-12-02 ENCOUNTER — Ambulatory Visit (HOSPITAL_COMMUNITY)
Admission: RE | Admit: 2016-12-02 | Discharge: 2016-12-02 | Disposition: A | Discharge status: Home / Self Care | Payer: Medicare Other | Source: Ambulatory Visit | Attending: Hematology and Oncology | Admitting: Hematology and Oncology

## 2016-12-02 ENCOUNTER — Other Ambulatory Visit: Payer: Self-pay | Admitting: Hematology and Oncology

## 2016-12-02 ENCOUNTER — Other Ambulatory Visit: Payer: Self-pay | Admitting: Internal Medicine

## 2016-12-02 ENCOUNTER — Telehealth: Payer: Self-pay | Admitting: *Deleted

## 2016-12-02 DIAGNOSIS — R1310 Dysphagia, unspecified: Secondary | ICD-10-CM

## 2016-12-02 DIAGNOSIS — R1319 Other dysphagia: Secondary | ICD-10-CM

## 2016-12-02 MED ORDER — PREDNISONE 50 MG PO TABS: ORAL_TABLET | ORAL | 0 refills | Status: DC

## 2016-12-02 MED ORDER — DIPHENHYDRAMINE HCL 50 MG PO TABS: 25.0000 mg | ORAL_TABLET | Freq: Once | ORAL | 0 refills | Status: DC

## 2016-12-02 NOTE — Telephone Encounter (Signed)
CT called stating pt has arrived for CT scan, pt has listed contrast allergy.  Per Dr. Lebron Conners, reschedule CT scan, will premed patient prior to CT in order to receive contrast.  Per Lattie Haw in radiology, premed protocol states Prednisone 50mg  @ 13hr, 7hr, and 1hr prior to scan, and benadryl 1hr prior to scan.  Pt will be given number to contact central scheduling to reschedule scan.  Will review premed protocol with Dr. Lebron Conners.

## 2016-12-05 ENCOUNTER — Ambulatory Visit (HOSPITAL_COMMUNITY)
Admission: RE | Admit: 2016-12-05 | Discharge: 2016-12-05 | Disposition: A | Discharge status: Home / Self Care | Payer: Medicare Other | Source: Ambulatory Visit | Attending: Hematology and Oncology | Admitting: Hematology and Oncology

## 2016-12-05 DIAGNOSIS — I2510 Atherosclerotic heart disease of native coronary artery without angina pectoris: Secondary | ICD-10-CM | POA: Diagnosis not present

## 2016-12-05 DIAGNOSIS — K839 Disease of biliary tract, unspecified: Secondary | ICD-10-CM | POA: Diagnosis not present

## 2016-12-05 DIAGNOSIS — J438 Other emphysema: Secondary | ICD-10-CM | POA: Diagnosis not present

## 2016-12-05 DIAGNOSIS — R6881 Early satiety: Secondary | ICD-10-CM | POA: Diagnosis not present

## 2016-12-05 DIAGNOSIS — R1310 Dysphagia, unspecified: Secondary | ICD-10-CM | POA: Insufficient documentation

## 2016-12-05 DIAGNOSIS — K5730 Diverticulosis of large intestine without perforation or abscess without bleeding: Secondary | ICD-10-CM | POA: Diagnosis not present

## 2016-12-05 DIAGNOSIS — K5790 Diverticulosis of intestine, part unspecified, without perforation or abscess without bleeding: Secondary | ICD-10-CM | POA: Diagnosis not present

## 2016-12-05 DIAGNOSIS — I700 Atherosclerosis of aorta: Secondary | ICD-10-CM | POA: Insufficient documentation

## 2016-12-05 DIAGNOSIS — J449 Chronic obstructive pulmonary disease, unspecified: Secondary | ICD-10-CM | POA: Diagnosis not present

## 2016-12-05 MED ORDER — IOPAMIDOL (ISOVUE-300) INJECTION 61%
100.0000 mL | Freq: Once | INTRAVENOUS | Status: AC | PRN
  Administered 2016-12-05: 100 mL via INTRAVENOUS

## 2016-12-06 ENCOUNTER — Telehealth: Payer: Self-pay

## 2016-12-06 NOTE — Telephone Encounter (Signed)
Received call report on CT CAP done 9/20. Highlighting impression #2

## 2016-12-06 NOTE — Telephone Encounter (Signed)
I will be seeing the patient on 12/09/16 to review the findings and discuss further evaluations

## 2016-12-09 ENCOUNTER — Ambulatory Visit (HOSPITAL_BASED_OUTPATIENT_CLINIC_OR_DEPARTMENT_OTHER): Payer: Medicare Other | Admitting: Hematology and Oncology

## 2016-12-09 ENCOUNTER — Telehealth: Payer: Self-pay | Admitting: Hematology and Oncology

## 2016-12-09 ENCOUNTER — Other Ambulatory Visit: Payer: Self-pay | Admitting: Hematology and Oncology

## 2016-12-09 ENCOUNTER — Encounter: Payer: Self-pay | Admitting: Hematology and Oncology

## 2016-12-09 ENCOUNTER — Ambulatory Visit (HOSPITAL_BASED_OUTPATIENT_CLINIC_OR_DEPARTMENT_OTHER): Payer: Medicare Other

## 2016-12-09 ENCOUNTER — Ambulatory Visit: Payer: Medicare Other

## 2016-12-09 VITALS — BP 145/59 | HR 62 | Temp 98.4°F | Resp 20 | Ht 69.0 in | Wt 197.1 lb

## 2016-12-09 DIAGNOSIS — Z95828 Presence of other vascular implants and grafts: Secondary | ICD-10-CM

## 2016-12-09 DIAGNOSIS — D751 Secondary polycythemia: Secondary | ICD-10-CM

## 2016-12-09 DIAGNOSIS — K838 Other specified diseases of biliary tract: Secondary | ICD-10-CM

## 2016-12-09 DIAGNOSIS — R1310 Dysphagia, unspecified: Secondary | ICD-10-CM

## 2016-12-09 DIAGNOSIS — R1319 Other dysphagia: Secondary | ICD-10-CM

## 2016-12-09 NOTE — Telephone Encounter (Signed)
Gave avs and calendar for October  °

## 2016-12-12 ENCOUNTER — Other Ambulatory Visit: Payer: Self-pay | Admitting: Internal Medicine

## 2016-12-12 DIAGNOSIS — K838 Other specified diseases of biliary tract: Secondary | ICD-10-CM | POA: Insufficient documentation

## 2016-12-12 DIAGNOSIS — Z95828 Presence of other vascular implants and grafts: Secondary | ICD-10-CM | POA: Insufficient documentation

## 2016-12-12 NOTE — Assessment & Plan Note (Signed)
Patient is now reporting symptoms of early satiety, but also placed significant dysphasia with food being stuck in the lower esophagus by report. In the context of 2 years of duration of symptoms as well as history of smoking, these findings are worrisome for possible development of esophageal stricture or esophageal malignancy.  CT scan of the chest and abdomen demonstrates no abnormalities in the thoracic esophagus or appearance of the stomach or liver to explain the dysphasia. No significant splenomegaly noted. Plan: --Consult GI for EGD

## 2016-12-12 NOTE — Assessment & Plan Note (Signed)
61 y.o. male with mild-moderate tobacco abuse history and known diagnosis of COPD/chronic bronchitis referred to our clinic for evaluation of elevated hemoglobin with suspicion for possible polycythemia development. Review of the trends in hemoglobin demonstrates normal hemoglobin values 2.5 years ago with progressive rise in both the hemoglobin and white blood cell count which are suspicious for possible myeloproliferative neoplasm development. Differential diagnosis includes progressive pulmonary dysfunction with reactive change.  Work obtained at the last visit to our clinic demonstrates persistent leukocytosis with neutrophilic and monocytic predominance, stable elevated hemoglobin with normocytic normochromic red blood cells and normal level of erythropoietin of 5.6. My expectation would be the erythropoietin level to be high normal or elevated in the setting of COPD that is confirmed by appearance of the CT scan, so I think obtaining additional testing in the peripheral blood is reasonable at this time due to possibility of concurrent presentation of polycythemia and COPD.   Plan: --Labs today as outlined below --Will review results at the next visit in the clinic

## 2016-12-12 NOTE — Assessment & Plan Note (Signed)
IVC filter discovered during evaluation. Appears to have been placed in 2010 for PE prophylaxis and never retrieved. Patient reports that in the past, though was not attempted due to position of the filter in relation to the wall of the IVC.  Plan: --Interventional radiology consultation for evaluation for possibility of IVC filter retrieval

## 2016-12-12 NOTE — Assessment & Plan Note (Addendum)
Dilated intrahepatic biliary ducts discovered on CT of the abdomen. No clear explanation at this time considering normal LFTs. We will obtain a right upper quadrant ultrasound to confirm the finding. If still present, we will refer her to GI for possible MRCP or ERCP evaluation.  Plan: --RtUQ [place]

## 2016-12-12 NOTE — Progress Notes (Signed)
[facility] Follow-up Visit:  Assessment: Erythrocytosis 61 y.o. male with mild-moderate tobacco abuse history and known diagnosis of COPD/chronic bronchitis referred to our clinic for evaluation of elevated hemoglobin with suspicion for possible polycythemia development. Review of the trends in hemoglobin demonstrates normal hemoglobin values 2.5 years ago with progressive rise in both the hemoglobin and white blood cell count which are suspicious for possible myeloproliferative neoplasm development. Differential diagnosis includes progressive pulmonary dysfunction with reactive change.  Work obtained at the last visit to our clinic demonstrates persistent leukocytosis with neutrophilic and monocytic predominance, stable elevated hemoglobin with normocytic normochromic red blood cells and normal level of erythropoietin of 5.6. My expectation would be the erythropoietin level to be high normal or elevated in the setting of COPD that is confirmed by appearance of the CT scan, so I think obtaining additional testing in the peripheral blood is reasonable at this time due to possibility of concurrent presentation of polycythemia and COPD.   Plan: --Labs today as outlined below --Will review results at the next visit in the clinic    Esophageal dysphagia Patient is now reporting symptoms of early satiety, but also placed significant dysphasia with food being stuck in the lower esophagus by report. In the context of 2 years of duration of symptoms as well as history of smoking, these findings are worrisome for possible development of esophageal stricture or esophageal malignancy.  CT scan of the chest and abdomen demonstrates no abnormalities in the thoracic esophagus or appearance of the stomach or liver to explain the dysphasia. No significant splenomegaly noted. Plan: --Consult GI for EGD   Dilated intrahepatic bile duct Dilated intrahepatic biliary ducts discovered on CT of  the abdomen. No clear explanation at this time considering normal LFTs. We will obtain a right upper quadrant ultrasound to confirm the finding. If still present, we will refer her to GI for possible MRCP or ERCP evaluation.  Plan: --RtUQ [place]  Presence of IVC filter IVC filter discovered during evaluation. Appears to have been placed in 2010 for PE prophylaxis and never retrieved. Patient reports that in the past, though was not attempted due to position of the filter in relation to the wall of the IVC.  Plan: --Interventional radiology consultation for evaluation for possibility of IVC filter retrieval  Return to clinic in 2 weeks to continue evaluation and discuss results  Voice recognition software was used and creation of this note. Despite my best effort at editing the text, some misspelling/errors may have occurred.  Orders Placed This Encounter  Procedures  . US Abdomen Limited    Standing Status:   Future    Standing Expiration Date:   12/09/2017    Order Specific Question:   Reason for Exam (SYMPTOM  OR DIAGNOSIS REQUIRED)    Answer:   Dilated introhepatic biliary ducts, please eval for possible etiology    Order Specific Question:   Preferred imaging location?    Answer:   [facility]    Filter has been in place since 2014. Please eval if retrieval is possible    Standing Status:   Future    Standing Expiration Date:   02/08/2018    Order Specific Question:   Reason for Exam (SYMPTOM  OR DIAGNOSIS REQUIRED)    Answer:   Presence of IVC filter    Order Specific Question:   Preferred Imaging Location?    Answer:   [facility] (  INCLUDING V617F AND EXON 12), MPL,& CALR W/RFL MPN PANEL (NGS)    Standing Status:   Future    Number of Occurrences:   1    Standing Expiration Date:   12/09/2017  . BCR-ABL1 FISH    Standing Status:   Future    Number of Occurrences:   1    Standing Expiration Date:   12/09/2017   All  questions were answered.  . The patient knows to call the clinic with any problems, questions or concerns.  This note was electronically signed.    History of Presenting Illness Joshua [name] a 61 y.o. male followed in the [facility] for suspected polycythemia. His past medical history is outlined below.  On the initial presenteation on 11/25/16 he did have a number of symptoms which are fully described in the review of systems below. Of note, he has been experiencing dysphasia for the past 2 years and also early satiety for approximately a year. Patient denied unexpected weight loss. He denied fevers, chills, night sweats. Denied redness or swelling in the hands. Denied flushing of the face, headaches. Patient is currently smoking half a pack a day, previously smoked approximately 1 pack per day for 10 years, but subsequently reduced the amount and is thinking about quitting all together.  Following the last visit, additional lab work was obtained as well as imaging of the chest, abdomen, and pelvis. Results are reviewed below. Patient denies any new complaints since the last visit to the clinic. EGD requested last visit has not been scheduled so far.   Oncological/hematological History: --Labs, 10/01/12: WBC 10.0,                                                Hgb 14.5, Hct 42.0, MCV 89.9, MCH 31.0, RDW 13.5, Plt 282;  --Labs, 07/27/14: WBC 13.5,                                                Hgb 14.9, Hct 44.5, MCV 78.3, MCH ..., RDW 15.9, Plt 370 --Sleep Study, 05/16/15: Positive for restless legs and negative for sleep apnea evidence. --Labs, 02/13/16: WBC 10.1,                                                Hgb 17.1, Hct 50.3, MCV 87.8, MCH 29.8, RDW 15.0, Plt 253 --Labs, 10/14/16: WBC 12.3,                                                Hgb 19.4, Hct 58.1, MCV 93.8, MCH 33.4, RDW 15.8, Plt 270 --Labs, 11/25/16: WBC 12.9, ANC 8.7, ALC 2.5, Mono 1.3, Hgb 19.2, Hct 54.8, MCV 92.3,  MCH 32.3, RDW 22.9, Plt 229; LDH 206; Epo 5.6 --CT C/A/P, 12/05/16: Normal appearance of the esophagus, no mediastinal or hilar lymphadenopathy. Lungs demonstrate emphysema with blebs. Stable 0.6 cm right lower lobe nodule noted. Imaging of the abdomen demonstrates no retroperitoneal lymphadenopathy  splenomegaly, or abnormalities in the stomach. Common bile duct 0.9-1.1 cm with mild central intrahepatic ductal dilation. No focal liver masses. IVC filter in place (since 2010 as per pt).  Medical History: Past Medical History:  Diagnosis Date  . AC (acromioclavicular) joint bone spurs, unspecified laterality 11/25/2016  . Arthritis    "back" (05/16/2015)  . CAD in native artery, occluded Lcx and rotational atherectomy to LAD with DES 05/17/15 05/18/2015  . Chronic back pain    "all my back"  . Chronic bronchitis ([place])   . COPD (chronic obstructive pulmonary disease) ([place])   . GERD (gastroesophageal reflux disease)   . H/O blood clots    "had them in my back; put filter in before one of my neck ORs"  . History of gout   . Hyperlipidemia   . Hypertension   . Phlebitis   . Positive TB test   . Seasonal allergies   . Shortness of breath   . Spinal disease     Surgical History: Past Surgical History:  Procedure Laterality Date  . ANTERIOR CERVICAL DECOMP/DISCECTOMY FUSION  ~ 2001-2009 X 3  . ANTERIOR CERVICAL DECOMP/DISCECTOMY FUSION N/A 10/06/2012   Procedure: ANTERIOR CERVICAL DECOMPRESSION/DISCECTOMY FUSION 1 LEVEL;  Surgeon: [name], MD;  Location: [facility];  Service: Neurosurgery;  Laterality: N/A;  C7T1 anterior cervical decompression with fusion plating and bonegraft   . BACK SURGERY    . CARDIAC CATHETERIZATION  05/16/2015  . CARDIAC CATHETERIZATION N/A 05/16/2015   Procedure: Left Heart Cath and Coronary Angiography;  Surgeon: [name], MD;  Location: [facility];  Service: Cardiovascular;  Laterality: N/A;  . CARDIAC CATHETERIZATION N/A 05/16/2015    Procedure: Coronary Balloon Angioplasty;  Surgeon: [name], MD;  Location: [facility];  Service: Cardiovascular;  Laterality: N/A;  . CARDIAC CATHETERIZATION N/A 05/17/2015   Procedure: Coronary Stent Intervention Rotoblater;  Surgeon: [name], MD;  Location: [facility];  Service: Cardiovascular;  Laterality: N/A;  . ELBOW SURGERY Left 2016   "had bone pieces removed"  . EXCISIONAL HEMORRHOIDECTOMY    . FEMORAL BYPASS  ~ 2010  . IVC FILTER PLACEMENT ([place] HX)  2010   "had blood clots in my back"  . PERIPHERAL VASCULAR CATHETERIZATION N/A 02/14/2016   Procedure: Abdominal Aortogram w/Lower Extremity;  Surgeon: Wellington Hampshire, MD;  Location: [facility];  Service: Cardiovascular;  Laterality: N/A;  . POSTERIOR LAMINECTOMY / DECOMPRESSION LUMBAR SPINE  2006   "bone spurs"    Family History: Family History  Problem Relation Age of Onset  . Stomach cancer Mother        Deceased, 58s  . Hypertension Mother   . Hypertension Father   . Stroke Father   . Heart attack Father        Deceased, 44  . Aneurysm Brother   . Healthy Daughter   . Healthy Maternal Grandmother   . Leukemia Grandchild   . Diabetes Neg Hx   . Early death Neg Hx     Social History: Social History   Social History  . Marital status: Married    Spouse name: N/A  . Number of children: N/A  . Years of education: 7   Occupational History  . Disabled Disabled   Social History Main Topics  . Smoking status: Current Every Day Smoker    Packs/day: 0.50    Years: 47.00    Types: Cigarettes  . Smokeless tobacco: Never Used  .  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 Drug use: No  . Sexual activity: Not Currently   Other Topics Concern  . Not on file   Social History Narrative   Regular exercise-no   Caffeine Use-yes   Lives with wife in a one story home. Has 2 children.  On  disability for low back pain.  Used to work as a Games developer, lasted worked in 2007.     Education: 7th grade.    Allergies: Allergies  Allergen Reactions  . Lisinopril Hives and Itching  . Amitriptyline Other (See Comments)    Makes pt feel "weird" and nausea.  . Contrast Media [Iodinated Diagnostic Agents] Hives and Itching    Pt can use if taking benadryl   . Ibuprofen Other (See Comments)    Internal bleeding    Medications:  Current Outpatient Prescriptions  Medication Sig Dispense Refill  . aspirin 81 MG chewable tablet Chew 1 tablet (81 mg total) by mouth daily.    [name] atorvastatin (LIPITOR) 80 MG tablet Take 1 tablet (80 mg total) by mouth daily at 6 PM. 90 tablet 3  . bisoprolol (ZEBETA) 5 MG tablet Take 0.5 tablets (2.5 mg total) by mouth daily. 45 tablet 3  . Blood Glucose Monitoring Suppl (ONETOUCH VERIO) w/Device KIT 1 Device by Does not apply route once. 1 kit 0  . clopidogrel (PLAVIX) 75 MG tablet Take 1 tablet (75 mg total) by mouth daily. 94 tablet 3  . colchicine 0.6 MG tablet Take 2 tablets now, then 1 tablet after 1 hour. Can take 1 tab BID thereafter. 30 tablet 0  . diphenhydrAMINE (BENADRYL) 50 MG tablet Take 0.5 tablets (25 mg total) by mouth once. 1 hr prior to CT scan 30 tablet 0  . fluticasone (FLOVENT HFA) 44 MCG/ACT inhaler Inhale 1 puff into the lungs 2 (two) times daily as needed (shortness of breath).    [name] glucose blood (ONETOUCH VERIO) test strip 1 each by Other route 3 (three) times daily. 100 each 5  . hydrochlorothiazide (HYDRODIURIL) 25 MG tablet Take 1 tablet (25 mg total) by mouth daily. 90 tablet 3  . levocetirizine (XYZAL) 5 MG tablet TAKE 1 TABLET BY MOUTH  EVERY EVENING 90 tablet 1  . losartan (COZAAR) 25 MG tablet Take 0.5 tablets (12.5 mg total) by mouth daily. 45 tablet 3  . nitroGLYCERIN (NITROSTAT) 0.4 MG SL tablet Place 1 tablet (0.4 mg total) under the tongue every 5 (five) minutes as needed for chest pain. 25 tablet 4  . Omega-3 Fatty  Acids (FISH OIL) 1200 MG CAPS Take 1,200 mg by mouth daily.     [name] omeprazole (PRILOSEC) 20 MG capsule TAKE 1 CAPSULE BY MOUTH  DAILY 90 capsule 0  . ONE TOUCH LANCETS MISC 1 each by Does not apply route 3 (three) times daily. 200 each 5  . predniSONE (DELTASONE) 50 MG tablet 1 tab by mouth 13hrs, 7hrs, and 1 hr prior to the CT scan 3 tablet 0  . pregabalin (LYRICA) 50 MG capsule Take 1 capsule (50 mg total) by mouth 2 (two) times daily. 180 capsule 1  . PROAIR HFA 108 (90 Base) MCG/ACT inhaler USE 2 PUFFS EVERY 6 HOURS  AS NEEDED FOR WHEEZING OR  SHORTNESS OF BREATH 34 g 3  . traZODone (DESYREL) 50 MG tablet Take 1.5 tablets (75 mg total) by mouth at bedtime as needed.  135 tablet 0   No current facility-administered medications for this visit.     Review of Systems: Review of Systems  Constitutional: Negative.  Negative for appetite change, chills, diaphoresis, fatigue, fever and unexpected weight change.  HENT:   Positive for trouble swallowing.        Patient describes swallowing problems for the past 2 years with solid food worse than liquids. Fluid appears to get stuck in the lower chest and drinking fluids after food helps significantly.  Eyes: Negative.   Respiratory: Negative.   Cardiovascular: Negative.   Gastrointestinal: Negative for abdominal distention, abdominal pain, blood in stool, constipation, diarrhea, nausea, rectal pain and vomiting.       Patient does notice early satiety, presently he is down to eating 1 meal per day over the past year  Endocrine: Negative.   Genitourinary: Negative.    Musculoskeletal: Negative.  Negative for gait problem.  Skin: Negative.   Neurological: Positive for numbness. Negative for dizziness, extremity weakness, gait problem, headaches, light-headedness, seizures and speech difficulty.       Patient reports a burning sensation in the right forearm and arm for the past month. He has intermittent hurting sensation bilateral hands, he also has  chronic neuropathy in bilateral feet.  Hematological: Negative.   Psychiatric/Behavioral: Negative.      PHYSICAL EXAMINATION Blood pressure (!) 145/59, pulse 62, temperature 98.4 F (36.9 C), temperature source Oral, resp. rate 20, height '5\' 9"'  (1.753 m), weight 197 lb 1.6 oz (89.4 kg), SpO2 99 %.  ECOG PERFORMANCE STATUS: 1 - Symptomatic but completely ambulatory  Physical Exam  Constitutional: He is oriented to person, place, and time and well-developed, well-nourished, and in no distress. No distress.  HENT:  Head: Normocephalic and atraumatic.  Mouth/Throat: Oropharynx is clear and moist. No oropharyngeal exudate.  Eyes: Pupils are equal, round, and reactive to light. Conjunctivae are normal. No scleral icterus.  Neck: Normal range of motion. Neck supple. No thyromegaly present.  Cardiovascular: Normal rate, regular rhythm and normal heart sounds.   No murmur heard. Pulmonary/Chest: Effort normal. He has wheezes.  Diffuse expiratory wheezing noted. No dullness to percussion  Abdominal: Soft. Bowel sounds are normal. He exhibits no distension and no mass. There is no tenderness. There is no rebound and no guarding.  Musculoskeletal: Normal range of motion. He exhibits no edema or deformity.  Lymphadenopathy:    He has no cervical adenopathy.  Neurological: He is alert and oriented to person, place, and time. He displays normal reflexes. No cranial nerve deficit.  Skin: Skin is warm and dry. No rash noted. He is not diaphoretic. No erythema. No pallor.     LABORATORY DATA: I have personally reviewed the data as listed: No visits with results within 1 Week(s) from this visit.  Latest known visit with results is:  Appointment on 11/25/2016  Component Date Value Ref Range Status  . WBC 11/25/2016 12.9* 4.0 - 10.3 10e3/uL Final  . NEUT# 11/25/2016 8.7* 1.5 - 6.5 10e3/uL Final  . HGB 11/25/2016 19.2* 13.0 - 17.1 g/dL Final  . HCT 11/25/2016 54.8* 38.4 - 49.9 % Final  .  Platelets 11/25/2016 229  140 - 400 10e3/uL Final  . MCV 11/25/2016 92.3  79.3 - 98.0 fL Final  . MCH 11/25/2016 32.3  27.2 - 33.4 pg Final  . MCHC 11/25/2016 35.0  32.0 - 36.0 g/dL Final  . RBC 11/25/2016 5.94* 4.20 - 5.82 10e6/uL Final  . RDW 11/25/2016 15.0* 11.0 - 14.6 % Final  .  lymph# 11/25/2016 2.5  0.9 - 3.3 10e3/uL Final  . MONO# 11/25/2016 1.3* 0.1 - 0.9 10e3/uL Final  . Eosinophils Absolute 11/25/2016 0.3  0.0 - 0.5 10e3/uL Final  . Basophils Absolute 11/25/2016 0.0  0.0 - 0.1 10e3/uL Final  . NEUT% 11/25/2016 67.2  39.0 - 75.0 % Final  . LYMPH% 11/25/2016 19.6  14.0 - 49.0 % Final  . MONO% 11/25/2016 10.3  0.0 - 14.0 % Final  . EOS% 11/25/2016 2.6  0.0 - 7.0 % Final  . BASO% 11/25/2016 0.3  0.0 - 2.0 % Final  . Sodium 11/25/2016 140  136 - 145 mEq/L Final  . Potassium 11/25/2016 4.3  3.5 - 5.1 mEq/L Final  . Chloride 11/25/2016 102  98 - 109 mEq/L Final  . CO2 11/25/2016 27  22 - 29 mEq/L Final  . Glucose 11/25/2016 101  70 - 140 mg/dl Final   Glucose reference range is for nonfasting patients. Fasting glucose reference range is 70- 100.  [name] BUN 11/25/2016 12.6  7.0 - 26.0 mg/dL Final  . Creatinine 11/25/2016 0.9  0.7 - 1.3 mg/dL Final  . Total Bilirubin 11/25/2016 0.84  0.20 - 1.20 mg/dL Final  . Alkaline Phosphatase 11/25/2016 88  40 - 150 U/L Final  . AST 11/25/2016 24  5 - 34 U/L Final  . ALT 11/25/2016 20  0 - 55 U/L Final  . Total Protein 11/25/2016 8.0  6.4 - 8.3 g/dL Final  . Albumin 11/25/2016 4.2  3.5 - 5.0 g/dL Final  . Calcium 11/25/2016 10.3  8.4 - 10.4 mg/dL Final  . Anion Gap 11/25/2016 12* 3 - 11 mEq/L Final  . EGFR 11/25/2016 88* >90 ml/min/1.73 m2 Final   eGFR is calculated using the CKD-EPI Creatinine Equation (2009)  . LDH 11/25/2016 206  125 - 245 U/L Final  . Erythropoietin 11/25/2016 5.6  2.6 - 18.5 mIU/mL Final   Beck[employer] Coulter UniCel DxI 800 Immunoassay System       Ardath Sax, MD

## 2016-12-17 ENCOUNTER — Ambulatory Visit
Admission: RE | Admit: 2016-12-17 | Discharge: 2016-12-17 | Disposition: A | Discharge status: Home / Self Care | Payer: Self-pay | Source: Ambulatory Visit | Attending: Hematology and Oncology | Admitting: Hematology and Oncology

## 2016-12-17 ENCOUNTER — Ambulatory Visit (HOSPITAL_COMMUNITY): Payer: Medicare Other

## 2016-12-17 ENCOUNTER — Ambulatory Visit
Admission: RE | Admit: 2016-12-17 | Discharge: 2016-12-17 | Disposition: A | Discharge status: Home / Self Care | Payer: Medicare Other | Source: Ambulatory Visit | Attending: Hematology and Oncology | Admitting: Hematology and Oncology

## 2016-12-17 ENCOUNTER — Other Ambulatory Visit: Payer: Self-pay | Admitting: Hematology and Oncology

## 2016-12-17 DIAGNOSIS — Z95828 Presence of other vascular implants and grafts: Secondary | ICD-10-CM

## 2016-12-17 DIAGNOSIS — I82409 Acute embolism and thrombosis of unspecified deep veins of unspecified lower extremity: Secondary | ICD-10-CM | POA: Diagnosis not present

## 2016-12-17 DIAGNOSIS — Z4589 Encounter for adjustment and management of other implanted devices: Secondary | ICD-10-CM | POA: Diagnosis not present

## 2016-12-17 HISTORY — PX: IR RADIOLOGIST EVAL & MGMT: IMG5224

## 2016-12-17 NOTE — Consult Note (Signed)
Chief Complaint: Patient was seen in consultation today for  Chief Complaint  Patient presents with  . Follow-up    Follow up IVC filter   at the request of Perlov,Mikhail G  Referring Physician(s): Perlov,Mikhail G  History of Present Illness: Joshua [name] is a 61 y.o. male with history of an IVC filter placement in 2010 for a DVT and pulmonary embolism prophylaxis prior to surgery. Patient is uncertain where the DVT was located. A copy of the IVC filter placement report from 2010 says that a retrievable [place] filter was placed. Patient's past medical history is significant for cervical spine fusion, aortobifemoral procedure and coronary artery disease. The patient currently has dysphasia and is scheduled to have an EGD tomorrow. He says he has been off his blood thinner for 7 days and suspect he is referring to his Plavix. The patient is a current smoker and has COPD. Patient denies any problems with lower extremity swelling and no new DVT since 2010.  Past Medical History:  Diagnosis Date  . AC (acromioclavicular) joint bone spurs, unspecified laterality 11/25/2016  . Arthritis    "back" (05/16/2015)  . CAD in native artery, occluded Lcx and rotational atherectomy to LAD with DES 05/17/15 05/18/2015  . Chronic back pain    "all my back"  . Chronic bronchitis ([place])   . COPD (chronic obstructive pulmonary disease) ([place])   . GERD (gastroesophageal reflux disease)   . H/O blood clots    "had them in my back; put filter in before one of my neck ORs"  . History of gout   . Hyperlipidemia   . Hypertension   . Phlebitis   . Positive TB test   . Seasonal allergies   . Shortness of breath   . Spinal disease     Past Surgical History:  Procedure Laterality Date  . ANTERIOR CERVICAL DECOMP/DISCECTOMY FUSION  ~ 2001-2009 X 3  . ANTERIOR CERVICAL DECOMP/DISCECTOMY FUSION N/A 10/06/2012   Procedure: ANTERIOR CERVICAL DECOMPRESSION/DISCECTOMY FUSION 1 LEVEL;  Surgeon: [name], MD;  Location: [facility];  Service: Neurosurgery;  Laterality: N/A;  C7T1 anterior cervical decompression with fusion plating and bonegraft   . BACK SURGERY    . CARDIAC CATHETERIZATION  05/16/2015  . CARDIAC CATHETERIZATION N/A 05/16/2015   Procedure: Left Heart Cath and Coronary Angiography;  Surgeon: [name], MD;  Location: [facility];  Service: Cardiovascular;  Laterality: N/A;  . CARDIAC CATHETERIZATION N/A 05/16/2015   Procedure: Coronary Balloon Angioplasty;  Surgeon: [name], MD;  Location: [facility];  Service: Cardiovascular;  Laterality: N/A;  . CARDIAC CATHETERIZATION N/A 05/17/2015   Procedure: Coronary Stent Intervention Rotoblater;  Surgeon: [name], MD;  Location: [facility];  Service: Cardiovascular;  Laterality: N/A;  . ELBOW SURGERY Left 2016   "had bone pieces removed"  . EXCISIONAL HEMORRHOIDECTOMY    . FEMORAL BYPASS  ~ 2010  . IVC FILTER PLACEMENT ([place] [facility])  2010   "had blood clots in my back"  . PERIPHERAL VASCULAR CATHETERIZATION N/A 02/14/2016   Procedure: Abdominal Aortogram w/Lower Extremity;  Surgeon: Wellington Hampshire, MD;  Location: [facility];  Service: Cardiovascular;  Laterality: N/A;  . POSTERIOR LAMINECTOMY / DECOMPRESSION LUMBAR SPINE  2006   "bone spurs"    Allergies: Lisinopril; Amitriptyline; Contrast media [iodinated diagnostic agents]; and Ibuprofen  Medications: Prior to Admission medications   Medication Sig Start Date End Date Taking? Authorizing Provider  aspirin 81 MG chewable tablet Chew 1 tablet (81 mg total) by mouth daily. 05/18/15  Yes Isaiah Serge, NP  atorvastatin (LIPITOR) 80 MG tablet Take 1 tablet (80 mg total) by mouth daily at 6 PM. 02/06/16  Yes Skains, Thana Farr, MD  bisoprolol (ZEBETA) 5 MG tablet Take 0.5 tablets (2.5 mg total) by mouth daily. 02/06/16  Yes Jerline Pain, MD  Blood Glucose Monitoring Suppl (ONETOUCH VERIO) w/Device KIT 1 Device by Does not apply  route once. 05/19/15  Yes Jearld Fenton, NP  clopidogrel (PLAVIX) 75 MG tablet Take 1 tablet (75 mg total) by mouth daily. 02/06/16  Yes Jerline Pain, MD  colchicine 0.6 MG tablet Take 2 tablets now, then 1 tablet after 1 hour. Can take 1 tab BID thereafter. 10/11/16  Yes Baity, Coralie Keens, NP  fluticasone (FLOVENT HFA) 44 MCG/ACT inhaler Inhale 1 puff into the lungs 2 (two) times daily as needed (shortness of breath).   Yes [provider]  glucose blood (ONETOUCH VERIO) test strip 1 each by Other route 3 (three) times daily. 12/04/16  Yes Baity, Coralie Keens, NP  hydrochlorothiazide (HYDRODIURIL) 25 MG tablet Take 1 tablet (25 mg total) by mouth daily. 02/06/16  Yes Jerline Pain, MD  levocetirizine (XYZAL) 5 MG tablet TAKE 1 TABLET BY MOUTH  EVERY EVENING 05/13/16  Yes Baity, Coralie Keens, NP  losartan (COZAAR) 25 MG tablet Take 0.5 tablets (12.5 mg total) by mouth daily. 02/06/16  Yes Jerline Pain, MD  nitroGLYCERIN (NITROSTAT) 0.4 MG SL tablet Place 1 tablet (0.4 mg total) under the tongue every 5 (five) minutes as needed for chest pain. 09/25/15  Yes Baity, Coralie Keens, NP  Omega-3 Fatty Acids (FISH OIL) 1200 MG CAPS Take 1,200 mg by mouth daily.    Yes [provider]  omeprazole (PRILOSEC) 20 MG capsule TAKE 1 CAPSULE BY MOUTH  DAILY 09/11/16  Yes Baity, Coralie Keens, NP  ONE TOUCH LANCETS MISC 1 each by Does not apply route 3 (three) times daily. 05/19/15  Yes Baity, Coralie Keens, NP  pregabalin (LYRICA) 50 MG capsule Take 1 capsule (50 mg total) by mouth 2 (two) times daily. 08/30/16  Yes Jearld Fenton, NP  PROAIR HFA 108 [phone] Base) MCG/ACT inhaler USE 2 PUFFS EVERY 6 HOURS  AS NEEDED FOR WHEEZING OR  SHORTNESS OF BREATH 05/13/16  Yes Jearld Fenton, NP  traZODone (DESYREL) 50 MG tablet TAKE 1.5 TABLETS (75 MG TOTAL) BY MOUTH AT BEDTIME AS NEEDED. 12/12/16  Yes Baity, Coralie Keens, NP  diphenhydrAMINE (BENADRYL) 50 MG tablet Take 0.5 tablets (25 mg total) by mouth once. 1 hr prior to CT scan 12/02/16  12/02/16  Ardath Sax, MD  predniSONE (DELTASONE) 50 MG tablet 1 tab by mouth 13hrs, 7hrs, and 1 hr prior to the CT scan Patient not taking: Reported on 12/17/2016 12/02/16   Ardath Sax, MD  traZODone (DESYREL) 50 MG tablet Take 1.5 tablets (75 mg total) by mouth at bedtime as needed. Patient not taking: Reported on 12/17/2016 11/15/16   Jearld Fenton, NP     Family History  Problem Relation Age of Onset  . Stomach cancer Mother        Deceased, 48s  . Hypertension Mother   . Hypertension Father   . Stroke Father   . Heart attack Father        Deceased, 86  . Aneurysm Brother   . Healthy Daughter   . Healthy Maternal Grandmother   .  Leukemia Grandchild   . Diabetes Neg Hx   . Early death Neg Hx     Social History   Social History  . Marital status: Married    Spouse name: N/A  . Number of children: N/A  . Years of education: 7   Occupational History  . Disabled Disabled   Social History Main Topics  . Smoking status: Current Every Day Smoker    Packs/day: 0.50    Years: 47.00    Types: Cigarettes  . Smokeless tobacco: Never Used  . 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 Drug use: No  . Sexual activity: Not Currently   Other Topics Concern  . Not on file   Social History Narrative   Regular exercise-no   Caffeine Use-yes   Lives with wife in a one story home. Has 2 children.  On disability for low back pain.  Used to work as a Games developer, lasted worked in 2007.     Education: 7th grade.       Review of Systems  Constitutional: Negative.   Respiratory: Positive for wheezing.   Cardiovascular: Negative for chest pain and leg swelling.  Gastrointestinal: Negative for abdominal distention and abdominal pain.    Vital Signs: BP 126/75   Pulse (!) 57   Temp 98.4 F (36.9 C) (Oral)   Resp 14   Ht '5\' 9"'  (1.753 m)   Wt 197 lb (89.4 kg)   SpO2 95%   BMI  29.09 kg/m   Physical Exam  Constitutional: No distress.  Cardiovascular: Normal rate, regular rhythm and normal heart sounds.   Pulmonary/Chest: Effort normal. He has wheezes.  Abdominal: Soft. Bowel sounds are normal. He exhibits no distension. There is no tenderness.  Musculoskeletal: He exhibits no edema.         Imaging: Ct Chest W Contrast  Result Date: 12/05/2016 CLINICAL DATA:  Erythrocytosis. 61yo male with mild-moderate tobacco abuse history and known diagnosis of COPD/chronic bronchitis referred to our clinic for evaluation of elevated hemoglobin with suspicion for possible polycythemia development. Rise in hemoglobin and white blood cell count suspicious for possible myeloproliferative neoplasm development. Differential diagnosis includes progressive pulmonary dysfunction with reactive change. Patient is also reporting symptoms of early satiety and dysphagia with food being stuck in the lower esophagus by report. EXAM: CT CHEST, ABDOMEN, AND PELVIS WITH CONTRAST TECHNIQUE: Multidetector CT imaging of the chest, abdomen and pelvis was performed following the standard protocol during bolus administration of intravenous contrast. CONTRAST:  12m ISOVUE-300 IOPAMIDOL (ISOVUE-300) INJECTION 61% COMPARISON:  None. FINDINGS: CT CHEST FINDINGS Cardiovascular: Heart size is normal. Coronary artery calcifications, diffuse. Aortic atherosclerosis. No aortic aneurysm. Mediastinum/Nodes: No mass or enlarged lymph nodes within the mediastinum or perihilar regions. Esophagus appears normal. Trachea appears normal. Lungs/Pleura: Emphysematous blebs at each lung apex. Six mm pulmonary nodule within the right lower lobe (series 4, image 126), stable by my measurements compared to earlier CT of 08/26/2012 indicating benignity. Lungs otherwise clear. No pleural effusion. Musculoskeletal: Mild degenerative spurring within the mid and lower thoracic spine. Anterior cervical fusion hardware partially imaged  at the upper aspects of the exam. No acute or suspicious osseous finding. CT ABDOMEN PELVIS FINDINGS Hepatobiliary: Common bile duct measures 9-11 mm. Also, mild central intrahepatic bile duct dilatation. No obstructing mass or bile duct stone identified. No focal liver abnormality is seen.  Gallbladder appears normal. Pancreas: Pancreas appears  normal. No pancreatic duct dilatation. No peripancreatic fluid. Spleen: Normal in size without focal abnormality. Adrenals/Urinary Tract: Adrenal glands appear normal. Kidneys appear normal without mass, stone or hydronephrosis. Bladder is unremarkable, decompressed. Stomach/Bowel: Bowel is normal in caliber. Scattered mild diverticulosis without evidence of acute diverticulitis. No focal bowel wall thickening or evidence of bowel wall inflammation. Appendix is normal. Stomach is unremarkable, partially decompressed. Vascular/Lymphatic: Heavy atherosclerotic change of the normal caliber abdominal aorta. Surgical changes of aorta bi-iliac bypass which is patent and without evidence of surgical complicating feature. IVC filter appears appropriately positioned with tip below the level of the renal veins. No acute appearing vascular abnormality. No enlarged or morphologically abnormal lymph nodes identified within the abdomen or pelvis. Reproductive: Prostate is unremarkable. Other: No free fluid or abscess collection. No free intraperitoneal air. No soft tissue mass. Musculoskeletal: Mild degenerative change within the lumbar spine. Surgical changes of central canal decompression within the lumbar spine, without evidence of surgical complicating feature. No acute or suspicious osseous finding. IMPRESSION: 1. No definitive evidence of neoplastic process within the chest, abdomen or pelvis (see impression #2 for possible neoplastic process). No acute findings within the chest, abdomen or pelvis. 2. Common bile duct is dilated to 9-11 mm. No obstructing mass or bile duct stone  identified. No associated pancreatic duct dilatation. Neoplastic obstruction or distal stricture cannot be excluded on this exam. Recommend correlation with liver function tests. Consider further characterization with MRCP or ERCP. 3. Biapical emphysema. 4. Diffuse coronary artery calcifications. Recommend correlation with any possible associated cardiac symptoms. 5. Aortic atherosclerosis. 6. Esophagus appears normal. Given the history of dysphagia and early satiety, consider further characterization with upper GI fluoroscopic examination 7. Mild colonic diverticulosis without evidence of acute diverticulitis. These results will be called to the ordering clinician or representative by the Radiologist Assistant, and communication documented in the PACS or zVision Dashboard. Electronically Signed   By: Franki Cabot M.D.   On: 12/05/2016 16:29   Ct Abdomen Pelvis W Contrast  Result Date: 12/05/2016 CLINICAL DATA:  Erythrocytosis. 61yo male with mild-moderate tobacco abuse history and known diagnosis of COPD/chronic bronchitis referred to our clinic for evaluation of elevated hemoglobin with suspicion for possible polycythemia development. Rise in hemoglobin and white blood cell count suspicious for possible myeloproliferative neoplasm development. Differential diagnosis includes progressive pulmonary dysfunction with reactive change. Patient is also reporting symptoms of early satiety and dysphagia with food being stuck in the lower esophagus by report. EXAM: CT CHEST, ABDOMEN, AND PELVIS WITH CONTRAST TECHNIQUE: Multidetector CT imaging of the chest, abdomen and pelvis was performed following the standard protocol during bolus administration of intravenous contrast. CONTRAST:  157m ISOVUE-300 IOPAMIDOL (ISOVUE-300) INJECTION 61% COMPARISON:  None. FINDINGS: CT CHEST FINDINGS Cardiovascular: Heart size is normal. Coronary artery calcifications, diffuse. Aortic atherosclerosis. No aortic aneurysm.  Mediastinum/Nodes: No mass or enlarged lymph nodes within the mediastinum or perihilar regions. Esophagus appears normal. Trachea appears normal. Lungs/Pleura: Emphysematous blebs at each lung apex. Six mm pulmonary nodule within the right lower lobe (series 4, image 126), stable by my measurements compared to earlier CT of 08/26/2012 indicating benignity. Lungs otherwise clear. No pleural effusion. Musculoskeletal: Mild degenerative spurring within the mid and lower thoracic spine. Anterior cervical fusion hardware partially imaged at the upper aspects of the exam. No acute or suspicious osseous finding. CT ABDOMEN PELVIS FINDINGS Hepatobiliary: Common bile duct measures 9-11 mm. Also, mild central intrahepatic bile duct dilatation. No obstructing mass or bile duct stone identified. No focal liver abnormality is  seen.  Gallbladder appears normal. Pancreas: Pancreas appears normal. No pancreatic duct dilatation. No peripancreatic fluid. Spleen: Normal in size without focal abnormality. Adrenals/Urinary Tract: Adrenal glands appear normal. Kidneys appear normal without mass, stone or hydronephrosis. Bladder is unremarkable, decompressed. Stomach/Bowel: Bowel is normal in caliber. Scattered mild diverticulosis without evidence of acute diverticulitis. No focal bowel wall thickening or evidence of bowel wall inflammation. Appendix is normal. Stomach is unremarkable, partially decompressed. Vascular/Lymphatic: Heavy atherosclerotic change of the normal caliber abdominal aorta. Surgical changes of aorta bi-iliac bypass which is patent and without evidence of surgical complicating feature. IVC filter appears appropriately positioned with tip below the level of the renal veins. No acute appearing vascular abnormality. No enlarged or morphologically abnormal lymph nodes identified within the abdomen or pelvis. Reproductive: Prostate is unremarkable. Other: No free fluid or abscess collection. No free intraperitoneal air. No  soft tissue mass. Musculoskeletal: Mild degenerative change within the lumbar spine. Surgical changes of central canal decompression within the lumbar spine, without evidence of surgical complicating feature. No acute or suspicious osseous finding. IMPRESSION: 1. No definitive evidence of neoplastic process within the chest, abdomen or pelvis (see impression #2 for possible neoplastic process). No acute findings within the chest, abdomen or pelvis. 2. Common bile duct is dilated to 9-11 mm. No obstructing mass or bile duct stone identified. No associated pancreatic duct dilatation. Neoplastic obstruction or distal stricture cannot be excluded on this exam. Recommend correlation with liver function tests. Consider further characterization with MRCP or ERCP. 3. Biapical emphysema. 4. Diffuse coronary artery calcifications. Recommend correlation with any possible associated cardiac symptoms. 5. Aortic atherosclerosis. 6. Esophagus appears normal. Given the history of dysphagia and early satiety, consider further characterization with upper GI fluoroscopic examination 7. Mild colonic diverticulosis without evidence of acute diverticulitis. These results will be called to the ordering clinician or representative by the Radiologist Assistant, and communication documented in the PACS or zVision Dashboard. Electronically Signed   By: Franki Cabot M.D.   On: 12/05/2016 16:29   US Venous Img Lower Bilateral  Result Date: 12/17/2016 CLINICAL DATA:  61 year old male with history of an IVC filter and prior DVT. EXAM: BILATERAL LOWER EXTREMITY VENOUS DOPPLER ULTRASOUND TECHNIQUE: Gray-scale sonography with graded compression, as well as color Doppler and duplex ultrasound were performed to evaluate the lower extremity deep venous systems from the level of the common femoral vein and including the common femoral, femoral, profunda femoral, popliteal and calf veins including the posterior tibial, peroneal and gastrocnemius  veins when visible. The superficial great saphenous vein was also interrogated. Spectral Doppler was utilized to evaluate flow at rest and with distal augmentation maneuvers in the common femoral, femoral and popliteal veins. COMPARISON:  None. FINDINGS: RIGHT LOWER EXTREMITY Common Femoral Vein: No evidence of thrombus. Normal compressibility, respiratory phasicity and response to augmentation. Saphenofemoral Junction: No evidence of thrombus. Normal compressibility and flow on color Doppler imaging. Profunda Femoral Vein: No evidence of thrombus. Normal compressibility and flow on color Doppler imaging. Femoral Vein: No evidence of thrombus. Normal compressibility, respiratory phasicity and response to augmentation. Popliteal Vein: No evidence of thrombus. Normal compressibility, respiratory phasicity and response to augmentation. Calf Veins: No evidence of thrombus. Normal compressibility and flow on color Doppler imaging. Superficial Great Saphenous Vein: No evidence of thrombus. Normal compressibility. Other Findings:  None. LEFT LOWER EXTREMITY Common Femoral Vein: No evidence of thrombus. Normal compressibility, respiratory phasicity and response to augmentation. Saphenofemoral Junction: No evidence of thrombus. Normal compressibility and flow on color Doppler imaging.  Profunda Femoral Vein: No evidence of thrombus. Normal compressibility and flow on color Doppler imaging. Femoral Vein: No evidence of thrombus. Normal compressibility, respiratory phasicity and response to augmentation. Popliteal Vein: No evidence of thrombus. Normal compressibility, respiratory phasicity and response to augmentation. Calf Veins: No evidence of thrombus. Normal compressibility and flow on color Doppler imaging. Superficial Great Saphenous Vein: No evidence of thrombus. Normal compressibility. Other Findings:  None. IMPRESSION: No evidence of DVT within either lower extremity. Electronically Signed   By: Markus Daft M.D.   On:  12/17/2016 16:42    I personally reviewed the CT scan from 12/05/2016. IVC filter is an infrarenal IVC. IVC and filter are patent. The tip of the filter is along the posterior wall the IVC. There is one filter leg that penetrates through the IVC and penetrates the right femoral bypass limb. There is no fluid or hematoma in this area. Renal veins are patent.  Labs:  CBC:  Recent Labs  02/13/16 0949 10/14/16 1440 11/25/16 1430  WBC 10.1 12.3* 12.9*  HGB 17.1 19.4 Repeated and verified X2.* 19.2*  HCT 50.3* 58.1* 54.8*  PLT 253 270.0 229    COAGS:  Recent Labs  02/13/16 0949  INR 1.0    BMP:  Recent Labs  02/13/16 0949 10/14/16 1440 11/25/16 1430  NA 138 139 140  K 4.6 3.7 4.3  CL 100 101  --   CO2 '29 30 27  ' GLUCOSE 109* 109* 101  BUN 14 10 12.6  CALCIUM 9.5 9.2 10.3  CREATININE 1.01 1.02 0.9    LIVER FUNCTION TESTS:  Recent Labs  10/14/16 1440 11/25/16 1430  BILITOT 0.9 0.84  AST 24 24  ALT 19 20  ALKPHOS 68 88  PROT 7.1 8.0  ALBUMIN 4.2 4.2    TUMOR MARKERS: No results for input(s): AFPTM, CEA, CA199, CHROMGRNA in the last 8760 hours.  Assessment and Plan:  61 year old male with an IVC filter that was placed in 2010 for PE prophylaxis prior to surgery. Patient would like to have the filter removed because he has seen advertisements for lawsuits related to IVC filters. I discussed the risks and benefits of an IVC filter to the patient. We reviewed his anatomy and the location of the IVC filter. The hook of the filter is along the posterior wall the IVC and likely embedded within the IVC. There is also a leg of the filter which is penetrating the IVC. Removal of this filter may be very difficult based on the location of the filter and the age of the filter. Fortunately, the patient has not had any problems with the filter and no evidence for DVT on the ultrasound exam from today.  He has no complaints of lower extremity swelling. We discussed the risks and  benefits of attempting the filter retrieval versus leaving it alone.  Potential problems with removing the filter at this time would include bleeding, filter disruption and incomplete removal of the filter. In my opinion, we may cause more problems trying to remove the filter at this stage rather than leaving it alone.  The patient has tolerated the filter very well and does not have problems after 8 years. We agreed to consider this filter permanent at this point and will not proceed with retrieving the filter unless there is a new concern in the future.  Thank you for this interesting consult.  I greatly enjoyed meeting Joshua [name] and look forward to participating in their care.  A copy of this  report was sent to the requesting provider on this date.  Electronically Signed: Carylon Perches 12/17/2016, 5:17 PM   I spent a total of  30 Minutes   in face to face in clinical consultation, greater than 50% of which was counseling/coordinating care for IVC filter management.

## 2016-12-18 ENCOUNTER — Ambulatory Visit (INDEPENDENT_AMBULATORY_CARE_PROVIDER_SITE_OTHER): Payer: Medicare Other | Admitting: Gastroenterology

## 2016-12-18 ENCOUNTER — Encounter: Payer: Self-pay | Admitting: Gastroenterology

## 2016-12-18 VITALS — BP 106/62 | HR 88 | Ht 69.0 in | Wt 191.4 lb

## 2016-12-18 DIAGNOSIS — R1310 Dysphagia, unspecified: Secondary | ICD-10-CM | POA: Diagnosis not present

## 2016-12-18 NOTE — Progress Notes (Signed)
12/18/2016 Joshua [name] [id number] Aug 31, 1955   HISTORY OF PRESENT ILLNESS:  This is a 61 year old male who was seen by me in the office last year for the first time with complaints of dysphagia. He had just recently been placed on Plavix for cardiac stent placements. We ordered an esophagram for that reason and was found to be normal. Now he returns today at the request of hematology/oncology Dr. Lebron Conners, for the same reason. He tells me that he has issue swallowing both solids and liquids. The dysphagia to both of those substances is intermittent. With solid food it seems that the food is just very slow and painful to go down. When he has the dysphagia to liquids he says it feels like there is a clog and then the liquids come back up. He is on omeprazole 20 mg daily for reflux. He tells me that he has been off of his Plavix for the past 7 days at the recommendation of hematologist as there was a misunderstanding that he was getting a procedure today.   Past Medical History:  Diagnosis Date  . AC (acromioclavicular) joint bone spurs, unspecified laterality 11/25/2016  . Arthritis    "back" (05/16/2015)  . CAD in native artery, occluded Lcx and rotational atherectomy to LAD with DES 05/17/15 05/18/2015  . Chronic back pain    "all my back"  . Chronic bronchitis ([place])   . COPD (chronic obstructive pulmonary disease) ([place])   . GERD (gastroesophageal reflux disease)   . H/O blood clots    "had them in my back; put filter in before one of my neck ORs"  . History of gout   . Hyperlipidemia   . Hypertension   . Phlebitis   . Positive TB test   . Seasonal allergies   . Shortness of breath   . Spinal disease    Past Surgical History:  Procedure Laterality Date  . ANTERIOR CERVICAL DECOMP/DISCECTOMY FUSION  ~ 2001-2009 X 3  . ANTERIOR CERVICAL DECOMP/DISCECTOMY FUSION N/A 10/06/2012   Procedure: ANTERIOR CERVICAL DECOMPRESSION/DISCECTOMY FUSION 1 LEVEL;  Surgeon: [name], MD;   Location: [facility];  Service: Neurosurgery;  Laterality: N/A;  C7T1 anterior cervical decompression with fusion plating and bonegraft   . BACK SURGERY    . CARDIAC CATHETERIZATION  05/16/2015  . CARDIAC CATHETERIZATION N/A 05/16/2015   Procedure: Left Heart Cath and Coronary Angiography;  Surgeon: [name], MD;  Location: [facility];  Service: Cardiovascular;  Laterality: N/A;  . CARDIAC CATHETERIZATION N/A 05/16/2015   Procedure: Coronary Balloon Angioplasty;  Surgeon: [name], MD;  Location: [facility];  Service: Cardiovascular;  Laterality: N/A;  . CARDIAC CATHETERIZATION N/A 05/17/2015   Procedure: Coronary Stent Intervention Rotoblater;  Surgeon: [name], MD;  Location: [facility];  Service: Cardiovascular;  Laterality: N/A;  . ELBOW SURGERY Left 2016   "had bone pieces removed"  . EXCISIONAL HEMORRHOIDECTOMY    . FEMORAL BYPASS  ~ 2010  . IVC FILTER PLACEMENT ([place] [facility])  2010   "had blood clots in my back"  . PERIPHERAL VASCULAR CATHETERIZATION N/A 02/14/2016   Procedure: Abdominal Aortogram w/Lower Extremity;  Surgeon: Wellington Hampshire, MD;  Location: [facility];  Service: Cardiovascular;  Laterality: N/A;  . POSTERIOR LAMINECTOMY / DECOMPRESSION LUMBAR SPINE  2006   "bone spurs"    reports that he has been smoking Cigarettes.  He has a 23.50 pack-year smoking history. He has  never used smokeless tobacco. He reports that he drinks alcohol. He reports that he does not use drugs. family history includes Aneurysm in his brother; Healthy in his daughter and maternal grandmother; Heart attack in his father; Hypertension in his father and mother; Leukemia in his grandchild; Stomach cancer in his mother; Stroke in his father. Allergies  Allergen Reactions  . Lisinopril Hives and Itching  . Amitriptyline Other (See Comments)    Makes pt feel "weird" and nausea.  . Contrast Media [Iodinated Diagnostic Agents] Hives and Itching    Pt can  use if taking benadryl   . Ibuprofen Other (See Comments)    Internal bleeding      Outpatient Encounter Prescriptions as of 12/18/2016  Medication Sig  . aspirin 81 MG chewable tablet Chew 1 tablet (81 mg total) by mouth daily.  [name] atorvastatin (LIPITOR) 80 MG tablet Take 1 tablet (80 mg total) by mouth daily at 6 PM.  . bisoprolol (ZEBETA) 5 MG tablet Take 0.5 tablets (2.5 mg total) by mouth daily.  . Blood Glucose Monitoring Suppl (ONETOUCH VERIO) w/Device KIT 1 Device by Does not apply route once.  . clopidogrel (PLAVIX) 75 MG tablet Take 1 tablet (75 mg total) by mouth daily.  . colchicine 0.6 MG tablet Take 2 tablets now, then 1 tablet after 1 hour. Can take 1 tab BID thereafter.  . fluticasone (FLOVENT HFA) 44 MCG/ACT inhaler Inhale 1 puff into the lungs 2 (two) times daily as needed (shortness of breath).  [name] glucose blood (ONETOUCH VERIO) test strip 1 each by Other route 3 (three) times daily.  . hydrochlorothiazide (HYDRODIURIL) 25 MG tablet Take 1 tablet (25 mg total) by mouth daily.  [name] levocetirizine (XYZAL) 5 MG tablet TAKE 1 TABLET BY MOUTH  EVERY EVENING  . losartan (COZAAR) 25 MG tablet Take 0.5 tablets (12.5 mg total) by mouth daily.  . nitroGLYCERIN (NITROSTAT) 0.4 MG SL tablet Place 1 tablet (0.4 mg total) under the tongue every 5 (five) minutes as needed for chest pain.  . Omega-3 Fatty Acids (FISH OIL) 1200 MG CAPS Take 1,200 mg by mouth daily.   [name] omeprazole (PRILOSEC) 20 MG capsule TAKE 1 CAPSULE BY MOUTH  DAILY  . ONE TOUCH LANCETS MISC 1 each by Does not apply route 3 (three) times daily.  . predniSONE (DELTASONE) 50 MG tablet 1 tab by mouth 13hrs, 7hrs, and 1 hr prior to the CT scan  . pregabalin (LYRICA) 50 MG capsule Take 1 capsule (50 mg total) by mouth 2 (two) times daily.  [name] PROAIR HFA 108 (90 Base) MCG/ACT inhaler USE 2 PUFFS EVERY 6 HOURS  AS NEEDED FOR WHEEZING OR  SHORTNESS OF BREATH  . traZODone (DESYREL) 50 MG tablet Take 1.5 tablets (75 mg total) by mouth  at bedtime as needed.  . traZODone (DESYREL) 50 MG tablet TAKE 1.5 TABLETS (75 MG TOTAL) BY MOUTH AT BEDTIME AS NEEDED.  [name] diphenhydrAMINE (BENADRYL) 50 MG tablet Take 0.5 tablets (25 mg total) by mouth once. 1 hr prior to CT scan   No facility-administered encounter medications on file as of 12/18/2016.      REVIEW OF SYSTEMS  : All other systems reviewed and negative except where noted in the History of Present Illness.   PHYSICAL EXAM: BP 106/62   Pulse 88   Ht '5\' 9"'  (1.753 m)   Wt 191 lb 6.4 oz (86.8 kg)   BMI 28.26 kg/m  General: Well developed white male in no acute distress Head: Normocephalic  and atraumatic Eyes:  Sclerae anicteric, conjunctiva pink. Ears: Normal auditory acuity Lungs: Clear throughout to auscultation; no increased WOB. Heart: Regular rate and rhythm; no M/R/G. Abdomen: Soft, non-distended.  Normal bowel sounds.  Mild epigastric TTP. Musculoskeletal: Symmetrical with no gross deformities  Skin: No lesions on visible extremities Extremities: No edema  Neurological: Alert oriented x 4, grossly non-focal Psychological:  Alert and cooperative. Normal mood and affect  ASSESSMENT AND PLAN: *Dysphagia to both solids and liquids:  Normal esophagram last year.  ? EoE or dysfunctional esophagus.  Patient took it upon himself to stop his Plavix for the past 7 days thinking that he was getting a procedure today.  Discussed with Dr. Henrene Pastor.  Will put him on for EGD with possible dilation tomorrow, 10/4.  Will also plan for an esophageal manometry. *Chronic antiplatelet use with Plavix due to CAD with stents in 2017:  Has already been off of his Plavix for the past 7 days.  **The risks, benefits, and alternatives to EGD with dilation were discussed with the patient and he consents to proceed.    CC:  Ardath Sax, MD

## 2016-12-18 NOTE — Patient Instructions (Addendum)
If you are age 61 or older, your body mass index should be between 23-30. Your Body mass index is 28.26 kg/m. If this is out of the aforementioned range listed, please consider follow up with your Primary Care Provider.  If you are age 40 or younger, your body mass index should be between 19-25. Your Body mass index is 28.26 kg/m. If this is out of the aformentioned range listed, please consider follow up with your Primary Care Provider.   You have been scheduled for an endoscopy. Please follow written instructions given to you at your visit today. If you use inhalers (even only as needed), please bring them with you on the day of your procedure. Your physician has requested that you go to www.startemmi.com and enter the access code given to you at your visit today. This web site gives a general overview about your procedure. However, you should still follow specific instructions given to you by our office regarding your preparation for the procedure.  You have been scheduled for an esophageal manometry at [facility] on Monday, October 15th at 12:30pm. Please arrive 30 minutes prior to your procedure for registration. You will need to go to outpatient registration (1st floor of the hospital) first. Make certain to bring your insurance cards as well as a complete list of medications.  Please remember the following:  1) Do not take any muscle relaxants, xanax (alprazolam) or ativan for 1 day prior to your test as well as the day of the test.  2) Nothing to eat or drink after 12:00 midnight on the night before your test.  3) Hold all diabetic medications/insulin the morning of the test. You may eat and take your medications after the test.  It will take at least 2 weeks to receive the results of this test from your physician. ------------------------------------------ ABOUT ESOPHAGEAL MANOMETRY Esophageal manometry (muh-NOM-uh-tree) is a test that gauges how well your esophagus works.  Your esophagus is the long, muscular tube that connects your throat to your stomach. Esophageal manometry measures the rhythmic muscle contractions (peristalsis) that occur in your esophagus when you swallow. Esophageal manometry also measures the coordination and force exerted by the muscles of your esophagus.  During esophageal manometry, a thin, flexible tube (catheter) that contains sensors is passed through your nose, down your esophagus and into your stomach. Esophageal manometry can be helpful in diagnosing some mostly uncommon disorders that affect your esophagus.  Why it's done Esophageal manometry is used to evaluate the movement (motility) of food through the esophagus and into the stomach. The test measures how well the circular bands of muscle (sphincters) at the top and bottom of your esophagus open and close, as well as the pressure, strength and pattern of the wave of esophageal muscle contractions that moves food along.  What you can expect Esophageal manometry is an outpatient procedure done without sedation. Most people tolerate it well. You may be asked to change into a hospital gown before the test starts.  During esophageal manometry  While you are sitting up, a member of your health care team sprays your throat with a numbing medication or puts numbing gel in your nose or both.  A catheter is guided through your nose into your esophagus. The catheter may be sheathed in a water-filled sleeve. It doesn't interfere with your breathing. However, your eyes may water, and you may gag. You may have a slight nosebleed from irritation.  After the catheter is in place, you may be asked  to lie on your back on an exam table, or you may be asked to remain seated.  You then swallow small sips of water. As you do, a computer connected to the catheter records the pressure, strength and pattern of your esophageal muscle contractions.  During the test, you'll be asked to breathe slowly and smoothly,  remain as still as possible, and swallow only when you're asked to do so.  A member of your health care team may move the catheter down into your stomach while the catheter continues its measurements.  The catheter then is slowly withdrawn. The test usually lasts 20 to 30 minutes.  After esophageal manometry  When your esophageal manometry is complete, you may return to your normal activities  This test typically takes 30-45 minutes to complete. ________________________________________________________________________________

## 2016-12-18 NOTE — Progress Notes (Signed)
Case discussed with GI physician assistant. Dysphagia sounds like motility disorder. As the patient has taken himself off Plavix we will work him into tomorrows endoscopy schedule for EGD with probable dilation. May need manometry.

## 2016-12-19 ENCOUNTER — Encounter: Payer: Self-pay | Admitting: Internal Medicine

## 2016-12-19 ENCOUNTER — Ambulatory Visit (AMBULATORY_SURGERY_CENTER): Payer: Medicare Other | Admitting: Internal Medicine

## 2016-12-19 VITALS — BP 102/75 | HR 51 | Temp 98.4°F | Resp 18 | Ht 69.0 in | Wt 191.0 lb

## 2016-12-19 DIAGNOSIS — K269 Duodenal ulcer, unspecified as acute or chronic, without hemorrhage or perforation: Secondary | ICD-10-CM | POA: Diagnosis not present

## 2016-12-19 DIAGNOSIS — K253 Acute gastric ulcer without hemorrhage or perforation: Secondary | ICD-10-CM

## 2016-12-19 DIAGNOSIS — R1319 Other dysphagia: Secondary | ICD-10-CM

## 2016-12-19 DIAGNOSIS — R1310 Dysphagia, unspecified: Secondary | ICD-10-CM | POA: Diagnosis present

## 2016-12-19 DIAGNOSIS — J449 Chronic obstructive pulmonary disease, unspecified: Secondary | ICD-10-CM | POA: Diagnosis not present

## 2016-12-19 DIAGNOSIS — K219 Gastro-esophageal reflux disease without esophagitis: Secondary | ICD-10-CM | POA: Diagnosis not present

## 2016-12-19 MED ORDER — OMEPRAZOLE 40 MG PO CPDR: 40.0000 mg | DELAYED_RELEASE_CAPSULE | Freq: Every day | ORAL | 3 refills | Status: DC

## 2016-12-19 MED ORDER — SODIUM CHLORIDE 0.9 % IV SOLN: 500.0000 mL | INTRAVENOUS | Status: DC

## 2016-12-19 NOTE — Progress Notes (Signed)
Report to PACU, RN, vss, BBS= Clear.  

## 2016-12-19 NOTE — Op Note (Signed)
[facility] Patient Name: Joshua [name] Procedure Date: 12/19/2016 2:27 PM MRN: [id number] Endoscopist: Docia Chuck. Henrene Pastor , MD Age: 61 Referring MD:  Date of Birth: 10-Jul-1955 Gender: Male Account #: [id number] Procedure:                Upper GI endoscopy, with biopsies Indications:              Dysphagia Medicines:                Monitored Anesthesia Care Procedure:                Pre-Anesthesia Assessment:                           - Prior to the procedure, a History and Physical                            was performed, and patient medications and                            allergies were reviewed. The patient's tolerance of                            previous anesthesia was also reviewed. The risks                            and benefits of the procedure and the sedation                            options and risks were discussed with the patient.                            All questions were answered, and informed consent                            was obtained. Prior Anticoagulants: The patient has                            taken Pradaxa (dabigatran), last dose was 7 days                            prior to procedure. ASA Grade Assessment: IV - A                            patient with severe systemic disease that is a                            constant threat to life. After reviewing the risks                            and benefits, the patient was deemed in                            satisfactory condition to undergo the procedure.  After obtaining informed consent, the endoscope was                            passed under direct vision. Throughout the                            procedure, the patient's blood pressure, pulse, and                            oxygen saturations were monitored continuously. The                            Endoscope was introduced through the mouth, and                            advanced to the second part of  duodenum. The upper                            GI endoscopy was accomplished without difficulty.                            The patient tolerated the procedure well. Scope In: Scope Out: Findings:                 The esophagus was normal. The scope was withdrawn.                            Dilation was performed with a Maloney dilator with                            no resistance at 32 Fr.                           Multiple erosions were found in the gastric antrum.                            Biopsies were taken with a cold forceps for                            Helicobacter pylori testing using CLOtest.                           Two non-bleeding superficial duodenal ulcers with                            no stigmata of bleeding were found in the duodenal                            bulb. The largest lesion was 5 mm in largest                            dimension.                           The  examined duodenum was normal.                           The cardia and gastric fundus were normal on                            retroflexion. Complications:            No immediate complications. Estimated Blood Loss:     Estimated blood loss: none. Impression:               1. Normal esophagus status post empiric dilation                            with 54 French Maloney dilator                           2. Erosive gastritis status post CLO test                           3. Duodenal ulcer disease and duodenitis . Recommendation:           1. Post-dilation diet                           2. Resume Plavix today                           3. Omeprazole 40 mg daily                           4. Avoid NSAIDs such as ibuprofen, Advil, Aleve.                            Baby aspirin for your heart is okay                           5. Follow-up CLO testing and treat if positive                           6. Keep esophageal manometry as already scheduled                            this month                            7. Follow-up with Dr. Henrene Pastor in his office in about                            6 weeks Docia Chuck. Henrene Pastor, MD 12/19/2016 2:59:37 PM This report has been signed electronically.

## 2016-12-19 NOTE — Patient Instructions (Signed)
Discharge instructions given. Handout on a dilatation diet. Prescription sent to pharmacy. Avoid NSAIDS such as ibuprofen,Advil, Aleve. Baby aspirin  Is okay. Office will call to schedule follow up visit. Resume previous medications. YOU HAD AN ENDOSCOPIC PROCEDURE TODAY AT [facility]:   Refer to the procedure report that was given to you for any specific questions about what was found during the examination.  If the procedure report does not answer your questions, please call your gastroenterologist to clarify.  If you requested that your care partner not be given the details of your procedure findings, then the procedure report has been included in a sealed envelope for you to review at your convenience later.  YOU SHOULD EXPECT: Some feelings of bloating in the abdomen. Passage of more gas than usual.  Walking can help get rid of the air that was put into your GI tract during the procedure and reduce the bloating. If you had a lower endoscopy (such as a colonoscopy or flexible sigmoidoscopy) you may notice spotting of blood in your stool or on the toilet paper. If you underwent a bowel prep for your procedure, you may not have a normal bowel movement for a few days.  Please Note:  You might notice some irritation and congestion in your nose or some drainage.  This is from the oxygen used during your procedure.  There is no need for concern and it should clear up in a day or so.  SYMPTOMS TO REPORT IMMEDIATELY:    Following upper endoscopy (EGD)  Vomiting of blood or coffee ground material  New chest pain or pain under the shoulder blades  Painful or persistently difficult swallowing  New shortness of breath  Fever of 100F or higher  Black, tarry-looking stools  For urgent or emergent issues, a gastroenterologist can be reached at any hour by calling [phone].   DIET:  We do recommend a small meal at first, but then you may proceed to your regular diet.  Drink  plenty of fluids but you should avoid alcoholic beverages for 24 hours.  ACTIVITY:  You should plan to take it easy for the rest of today and you should NOT DRIVE or use heavy machinery until tomorrow (because of the sedation medicines used during the test).    FOLLOW UP: Our staff will call the number listed on your records the next business day following your procedure to check on you and address any questions or concerns that you may have regarding the information given to you following your procedure. If we do not reach you, we will leave a message.  However, if you are feeling well and you are not experiencing any problems, there is no need to return our call.  We will assume that you have returned to your regular daily activities without incident.  If any biopsies were taken you will be contacted by phone or by letter within the next 1-3 weeks.  Please call us at [phone] if you have not heard about the biopsies in 3 weeks.    SIGNATURES/CONFIDENTIALITY: You and/or your care partner have signed paperwork which will be entered into your electronic medical record.  These signatures attest to the fact that that the information above on your After Visit Summary has been reviewed and is understood.  Full responsibility of the confidentiality of this discharge information lies with you and/or your care-partner.

## 2016-12-19 NOTE — Progress Notes (Signed)
Called to room to assist during endoscopic procedure.  Patient ID and intended procedure confirmed with present staff. Received instructions for my participation in the procedure from the performing physician.  

## 2016-12-20 ENCOUNTER — Telehealth: Payer: Self-pay | Admitting: *Deleted

## 2016-12-20 NOTE — Telephone Encounter (Signed)
  Follow up Call-  Call back number 12/19/2016  Post procedure Call Back phone  # [phone], [phone]  Permission to leave phone message Yes  Some recent data might be hidden     Patient questions:  Do you have a fever, pain , or abdominal swelling? No. Pain Score  0 *  Have you tolerated food without any problems? Yes.    Have you been able to return to your normal activities? Yes.    Do you have any questions about your discharge instructions: Diet   No. Medications  No. Follow up visit  No.  Do you have questions or concerns about your Care? No.  Actions: * If pain score is 4 or above: No action needed, pain <4.

## 2016-12-20 NOTE — Telephone Encounter (Signed)
  Follow up Call-  Call back number 12/19/2016  Post procedure Call Back phone  # [phone], [phone]  Permission to leave phone message Yes  Some recent data might be hidden     Patient questions:  Message left to call us if necessary.

## 2016-12-23 ENCOUNTER — Ambulatory Visit (HOSPITAL_BASED_OUTPATIENT_CLINIC_OR_DEPARTMENT_OTHER): Payer: Medicare Other | Admitting: Hematology and Oncology

## 2016-12-23 ENCOUNTER — Encounter: Payer: Self-pay | Admitting: Hematology and Oncology

## 2016-12-23 ENCOUNTER — Telehealth: Payer: Self-pay | Admitting: Hematology and Oncology

## 2016-12-23 VITALS — BP 133/83 | HR 55 | Temp 98.8°F | Resp 17 | Ht 69.0 in | Wt 193.2 lb

## 2016-12-23 DIAGNOSIS — R1319 Other dysphagia: Secondary | ICD-10-CM

## 2016-12-23 DIAGNOSIS — Z95828 Presence of other vascular implants and grafts: Secondary | ICD-10-CM

## 2016-12-23 DIAGNOSIS — K838 Other specified diseases of biliary tract: Secondary | ICD-10-CM | POA: Diagnosis not present

## 2016-12-23 DIAGNOSIS — D751 Secondary polycythemia: Secondary | ICD-10-CM | POA: Diagnosis not present

## 2016-12-23 DIAGNOSIS — R1310 Dysphagia, unspecified: Secondary | ICD-10-CM

## 2016-12-23 LAB — HELICOBACTER PYLORI SCREEN-BIOPSY: UREASE: POSITIVE — AB

## 2016-12-23 NOTE — Telephone Encounter (Signed)
Gave avs and calendar for january °

## 2016-12-25 ENCOUNTER — Other Ambulatory Visit: Payer: Self-pay

## 2016-12-25 MED ORDER — BIS SUBCIT-METRONID-TETRACYC 140-125-125 MG PO CAPS: 3.0000 | ORAL_CAPSULE | Freq: Three times a day (TID) | ORAL | 0 refills | Status: DC

## 2016-12-25 NOTE — Assessment & Plan Note (Signed)
Patient reported symptoms of early satiety, but also placed significant dysphasia with food being stuck in the lower esophagus at the initial visit in our clinic. In the context of 2 years of duration of symptoms as well as history of smoking, these findings were worrisome for possible development of esophageal stricture or esophageal malignancy. With negative CT of the abdomen pelvis for lower esophageal or gastric abnormality, patient underwent EGD on 11/19/16. EGD demonstrated multifocal gastric erosions and to duodenal ulcers without active bleeding. Testing was positive for Helicobacter pylori by urease test.  Presence of ulcers may explain the elevated white blood cell count found in the patient.  Plan: --Will defer H. pylori management to gastroenterology

## 2016-12-25 NOTE — Assessment & Plan Note (Signed)
IVC filter discovered during evaluation. Appears to have been placed in 2010 for PE prophylaxis and never retrieved. Positionally filter was reviewed by our interventional radiology. Due to one of the retention legs penetrating through the wall of the IVC, retrieval at this time is impossible. No evidence of clot retention at the filter. No residual deep vein thrombosis in the lower extremities by Doppler ultrasound.  Plan: --Keep the IVC filter, continue therapeutic anticoagulation indefinitely.

## 2016-12-25 NOTE — Progress Notes (Signed)
Joshua Joshua [name]:  Assessment: Erythrocytosis 61 y.o. male with mild-moderate tobacco abuse history and known diagnosis of COPD/chronic bronchitis referred to our clinic for evaluation of elevated hemoglobin with suspicion for possible polycythemia development. Review of the trends in hemoglobin demonstrates normal hemoglobin values 2.5 years ago with progressive rise in both the hemoglobin and white blood cell count which are suspicious for possible myeloproliferative neoplasm development. Differential diagnosis includes progressive pulmonary dysfunction with reactive change. Labs obtained at our clinic demonstrated persistent leukocytosis with neutrophilic and monocytic predominance, stable elevated hemoglobin with normocytic normochromic red blood cells and normal level of erythropoietin of 5.6. My expectation would have been for erythropoietin level to be high normal or elevated in the setting of COPD that is confirmed by appearance of the CT scan, so we have obtained a panel of mutation tests by PCR which returned negative for mutations in any of the tested genes including negative tests for JAK2, MPL, or CALR mutations among others. 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 Findings discussed in detail with the patient. The negative mutation testing does not fully rule out presence of myeloproliferative neoplasm, but reduces the likelihood sufficiently to delay the need for bone marrow biopsy as completion of the testing   Plan: --observation --Patient will attempt to quit smoking. We will repeat lab work prior to return to the clinic. If lab abnormalities do not improve or get worse, bone marrow biopsy will be requested.  Presence of IVC filter IVC filter discovered during evaluation. Appears to have been placed in 2010 for PE prophylaxis and never retrieved. Positionally filter was reviewed by our interventional radiology. Due  to one of the retention legs penetrating through the wall of the IVC, retrieval at this time is impossible. No evidence of clot retention at the filter. No residual deep vein thrombosis in the lower extremities by Doppler ultrasound.  Plan: --Keep the IVC filter, continue therapeutic anticoagulation indefinitely.  Dilated intrahepatic bile duct Dilated intrahepatic biliary ducts discovered on CT of the abdomen. No clear explanation at this time considering normal LFTs. We will obtain a right upper quadrant ultrasound to confirm the finding. If still present, we will refer her to GI for possible MRCP or ERCP evaluation.  Plan: --RtUQ [place]  Dysphagia Patient reported symptoms of early satiety, but also placed significant dysphasia with food being stuck in the lower esophagus at the initial Joshua [name] in our clinic. In the context of 2 years of duration of symptoms as well as history of smoking, these findings were worrisome for possible development of esophageal stricture or esophageal malignancy. With negative CT of the abdomen pelvis for lower esophageal or gastric abnormality, patient underwent EGD on 11/19/16. EGD demonstrated multifocal gastric erosions and to duodenal ulcers without active bleeding. Testing was positive for Helicobacter pylori by urease test.  Presence of ulcers may explain the elevated white blood cell count found in the patient.  Plan: --Will defer H. pylori management to gastroenterology   Return to clinic in 3 months for continued hematological monitoring, or sooner if significant abnormalities are discovered on liver ultrasound.  Voice recognition software was used and creation of this note. Despite my best effort at editing the text, some misspelling/errors may have occurred.  Orders Placed This Encounter  Procedures  . CBC with Differential    Standing Status:   Future    Standing Expiration Date:   12/23/2017  . Comprehensive metabolic panel    Standing Status:  Future    Standing Expiration Date:   12/23/2017   All questions were answered.  . The patient knows to call the clinic with any problems, questions or concerns.  This note was electronically signed.    History of Presenting Illness Joshua Joshua [name] a 61 y.o. male followed in the [place] for suspected polycythemia. His past medical history is outlined below.  On the initial presenteation on 11/25/16 he did have a number of symptoms which are fully described in the review of systems below. Of note, he has been experiencing dysphasia for the past 2 years and also early satiety for approximately a year. Patient denied unexpected weight loss. He denied fevers, chills, night sweats. Denied redness or swelling in the hands. Denied flushing of the face, headaches. Patient is currently smoking half a pack a day, previously smoked approximately 1 pack per day for 10 years, but subsequently reduced the amount and is thinking about quitting all together.  Patient returns to the clinic to discuss results of the workup of elevated blood counts, dysphasia, IVC filter presence, and biliary ductal dilation. Patient underwent EGD, CT of the abdomen, but has not obtained right upper quadrant ultrasound so far. Patient has also been evaluated by interventional radiology who found the filter to be irretrievable at this time. Patient denies any new complaints since last Joshua [name] to the clinic.   Oncological/hematological History: --Labs, 10/01/12: WBC 10.0,                                                Hgb 14.5, Hct 42.0, MCV 89.9, MCH 31.0, RDW 13.5, Plt 282;  --Labs, 07/27/14: WBC 13.5,                                                Hgb 14.9, Hct 44.5, MCV 78.3, MCH ..., RDW 15.9, Plt 370 --Sleep Study, 05/16/15: Positive for restless legs and negative for sleep apnea evidence. --Labs, 02/13/16: WBC 10.1,                                                Hgb 17.1, Hct 50.3, MCV 87.8, MCH 29.8, RDW 15.0, Plt  253 --Labs, 10/14/16: WBC 12.3,                                                Hgb 19.4, Hct 58.1, MCV 93.8, MCH 33.4, RDW 15.8, Plt 270 --EGD, 11/19/16: Multiple gastric erosions, duodenal ulcers 2, urease breath test positive. --Labs, 11/25/16: WBC 12.9, ANC 8.7, ALC 2.5, Mono 1.3, Hgb 19.2, Hct 54.8, MCV 92.3, MCH 32.3, RDW 22.9, Plt 229; LDH 206; Epo 5.6 --CT C/A/P, 12/05/16: Normal appearance of the esophagus, no mediastinal or hilar lymphadenopathy. Lungs demonstrate emphysema with blebs. Stable 0.6 cm right lower lobe nodule noted. Imaging of the abdomen demonstrates no retroperitoneal lymphadenopathy splenomegaly, or abnormalities in the stomach. Common bile duct 0.9-1.1 cm with mild central intrahepatic ductal dilation. No focal liver masses. IVC  filter in place (since 2010 as per pt). --Mutation testing, 12/09/16: Patient tested negative for mutations in JAK2, CALR, MPL etc which would've been consistent with myeloproliferative neoplasm or MDS.   Medical History: Past Medical History:  Diagnosis Date  . AC (acromioclavicular) joint bone spurs, unspecified laterality 11/25/2016  . Arthritis    "back" (05/16/2015)  . CAD in native artery, occluded Lcx and rotational atherectomy to LAD with DES 05/17/15 05/18/2015  . Chronic back pain    "all my back"  . Chronic bronchitis ([place])   . COPD (chronic obstructive pulmonary disease) ([place])   . GERD (gastroesophageal reflux disease)   . H/O blood clots    "had them in my back; put filter in before one of my neck ORs"  . History of gout   . Hyperlipidemia   . Hypertension   . Phlebitis   . Positive TB test   . Seasonal allergies   . Shortness of breath   . Spinal disease     Surgical History: Past Surgical History:  Procedure Laterality Date  . ANTERIOR CERVICAL DECOMP/DISCECTOMY FUSION  ~ 2001-2009 X 3  . ANTERIOR CERVICAL DECOMP/DISCECTOMY FUSION N/A 10/06/2012   Procedure: ANTERIOR CERVICAL DECOMPRESSION/DISCECTOMY FUSION 1 LEVEL;   Surgeon: [name], MD;  Location: [facility];  Service: Neurosurgery;  Laterality: N/A;  C7T1 anterior cervical decompression with fusion plating and bonegraft   . BACK SURGERY    . CARDIAC CATHETERIZATION  05/16/2015  . CARDIAC CATHETERIZATION N/A 05/16/2015   Procedure: Left Heart Cath and Coronary Angiography;  Surgeon: [name], MD;  Location: [facility];  Service: Cardiovascular;  Laterality: N/A;  . CARDIAC CATHETERIZATION N/A 05/16/2015   Procedure: Coronary Balloon Angioplasty;  Surgeon: [name], MD;  Location: [facility];  Service: Cardiovascular;  Laterality: N/A;  . CARDIAC CATHETERIZATION N/A 05/17/2015   Procedure: Coronary Stent Intervention Rotoblater;  Surgeon: [name], MD;  Location: [facility];  Service: Cardiovascular;  Laterality: N/A;  . ELBOW SURGERY Left 2016   "had bone pieces removed"  . EXCISIONAL HEMORRHOIDECTOMY    . FEMORAL BYPASS  ~ 2010  . IVC FILTER PLACEMENT ([place] [facility])  2010   "had blood clots in my back"  . PERIPHERAL VASCULAR CATHETERIZATION N/A 02/14/2016   Procedure: Abdominal Aortogram w/Lower Extremity;  Surgeon: Wellington Hampshire, MD;  Location: [facility];  Service: Cardiovascular;  Laterality: N/A;  . POSTERIOR LAMINECTOMY / DECOMPRESSION LUMBAR SPINE  2006   "bone spurs"    Family History: Family History  Problem Relation Age of Onset  . Stomach cancer Mother        Deceased, 38s  . Hypertension Mother   . Hypertension Father   . Stroke Father   . Heart attack Father        Deceased, 57  . Aneurysm Brother   . Healthy Daughter   . Healthy Maternal Grandmother   . Leukemia Grandchild   . Diabetes Neg Hx   . Early death Neg Hx   . Colon cancer Neg Hx   . Pancreatic cancer Neg Hx     Social History: Social History   Social History  . Marital status: Married    Spouse name: N/A  . Number of children: N/A  . Years of education: 7   Occupational History  . Disabled Disabled    Social History Main Topics  . Smoking status: Current Every Day Smoker    Packs/day: 0.50  Years: 47.00    Types: Cigarettes  . Smokeless tobacco: Never Used  . 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 Drug use: No  . Sexual activity: Not Currently   Other Topics Concern  . Not on file   Social History Narrative   Regular exercise-no   Caffeine Use-yes   Lives with wife in a one story home. Has 2 children.  On disability for low back pain.  Used to work as a Games developer, lasted worked in 2007.     Education: 7th grade.    Allergies: Allergies  Allergen Reactions  . Lisinopril Hives and Itching  . Amitriptyline Other (See Comments)    Makes pt feel "weird" and nausea.  . Contrast Media [Iodinated Diagnostic Agents] Hives and Itching    Pt can use if taking benadryl   . Ibuprofen Other (See Comments)    Internal bleeding    Medications:  Current Outpatient Prescriptions  Medication Sig Dispense Refill  . aspirin 81 MG chewable tablet Chew 1 tablet (81 mg total) by mouth daily.    [name] atorvastatin (LIPITOR) 80 MG tablet Take 1 tablet (80 mg total) by mouth daily at 6 PM. 90 tablet 3  . bisoprolol (ZEBETA) 5 MG tablet Take 0.5 tablets (2.5 mg total) by mouth daily. 45 tablet 3  . Blood Glucose Monitoring Suppl (ONETOUCH VERIO) w/Device KIT 1 Device by Does not apply route once. 1 kit 0  . clopidogrel (PLAVIX) 75 MG tablet Take 1 tablet (75 mg total) by mouth daily. 94 tablet 3  . colchicine 0.6 MG tablet Take 2 tablets now, then 1 tablet after 1 hour. Can take 1 tab BID thereafter. 30 tablet 0  . glucose blood (ONETOUCH VERIO) test strip 1 each by Other route 3 (three) times daily. 100 each 5  . hydrochlorothiazide (HYDRODIURIL) 25 MG tablet Take 1 tablet (25 mg total) by mouth daily. 90 tablet 3  . levocetirizine (XYZAL) 5 MG tablet TAKE 1 TABLET BY MOUTH  EVERY EVENING 90 tablet 1  .  losartan (COZAAR) 25 MG tablet Take 0.5 tablets (12.5 mg total) by mouth daily. 45 tablet 3  . nitroGLYCERIN (NITROSTAT) 0.4 MG SL tablet Place 1 tablet (0.4 mg total) under the tongue every 5 (five) minutes as needed for chest pain. 25 tablet 4  . Omega-3 Fatty Acids (FISH OIL) 1200 MG CAPS Take 1,200 mg by mouth daily.     [name] omeprazole (PRILOSEC) 20 MG capsule TAKE 1 CAPSULE BY MOUTH  DAILY 90 capsule 0  . omeprazole (PRILOSEC) 40 MG capsule Take 1 capsule (40 mg total) by mouth daily. 90 capsule 3  . ONE TOUCH LANCETS MISC 1 each by Does not apply route 3 (three) times daily. 200 each 5  . pregabalin (LYRICA) 50 MG capsule Take 1 capsule (50 mg total) by mouth 2 (two) times daily. 180 capsule 1  . PROAIR HFA 108 (90 Base) MCG/ACT inhaler USE 2 PUFFS EVERY 6 HOURS  AS NEEDED FOR WHEEZING OR  SHORTNESS OF BREATH 34 g 3  . traZODone (DESYREL) 50 MG tablet TAKE 1.5 TABLETS (75 MG TOTAL) BY MOUTH AT BEDTIME AS NEEDED. 45 tablet 0  . bismuth-metronidazole-tetracycline (PYLERA) 140-125-125 MG capsule Take 3 capsules by mouth 4 (four) times daily -  before meals and at bedtime. 168 capsule 0  . diphenhydrAMINE (BENADRYL) 50 MG tablet Take 0.5 tablets (25 mg  total) by mouth once. 1 hr prior to CT scan 30 tablet 0  . fluticasone (FLOVENT HFA) 44 MCG/ACT inhaler Inhale 1 puff into the lungs 2 (two) times daily as needed (shortness of breath).    . predniSONE (DELTASONE) 50 MG tablet 1 tab by mouth 13hrs, 7hrs, and 1 hr prior to the CT scan (Patient not taking: Reported on 12/23/2016) 3 tablet 0  . traZODone (DESYREL) 50 MG tablet Take 1.5 tablets (75 mg total) by mouth at bedtime as needed. (Patient not taking: Reported on 12/23/2016) 135 tablet 0   No current facility-administered medications for this Joshua [name].     Review of Systems: Review of Systems  Constitutional: Negative.  Negative for appetite change, chills, diaphoresis, fatigue, fever and unexpected weight change.  HENT:   Positive for trouble  swallowing.        Patient describes swallowing problems for the past 2 years with solid food worse than liquids. Fluid appears to get stuck in the lower chest and drinking fluids after food helps significantly.  Eyes: Negative.   Respiratory: Negative.   Cardiovascular: Negative.   Gastrointestinal: Negative for abdominal distention, abdominal pain, blood in stool, constipation, diarrhea, nausea, rectal pain and vomiting.       Patient does notice early satiety, presently he is down to eating 1 meal per day over the past year  Endocrine: Negative.   Genitourinary: Negative.    Musculoskeletal: Negative.  Negative for gait problem.  Skin: Negative.   Neurological: Positive for numbness. Negative for dizziness, extremity weakness, gait problem, headaches, light-headedness, seizures and speech difficulty.       Patient reports a burning sensation in the right forearm and arm for the past month. 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  Hematological: Negative.   Psychiatric/Behavioral: Negative.      PHYSICAL EXAMINATION Blood pressure 133/83, pulse (!) 55, temperature 98.8 F (37.1 C), temperature source Oral, resp. rate 17, height _0  (1.753 m), weight 193 lb 3.2 oz (87.6 kg), SpO2 99 %.  ECOG PERFORMANCE STATUS: 1 - Symptomatic but completely ambulatory  Physical Exam  Constitutional: He is oriented to person, place, and time and well-developed, well-nourished, and in no distress. No distress.  HENT:  Head: Normocephalic and atraumatic.  Mouth/Throat: Oropharynx is clear and moist. No oropharyngeal exudate.  Eyes: Pupils are equal, round, and reactive to light. Conjunctivae are normal. No scleral icterus.  Neck: Normal range of motion. Neck supple. No thyromegaly present.  Cardiovascular: Normal rate, regular rhythm and normal heart sounds.   No murmur heard. Pulmonary/Chest: Effort normal. He has wheezes.  Diffuse  expiratory wheezing noted. No dullness to percussion  Abdominal: Soft. Bowel sounds are normal. He exhibits no distension and no mass. There is no tenderness. There is no rebound and no guarding.  Musculoskeletal: Normal range of motion. He exhibits no edema or deformity.  Lymphadenopathy:    He has no cervical adenopathy.  Neurological: He is alert and oriented to person, place, and time. He displays normal reflexes. No cranial nerve deficit.  Skin: Skin is warm and dry. No rash noted. He is not diaphoretic. No erythema. No pallor.     LABORATORY DATA: I have personally reviewed the data as listed: Procedure Joshua [name] on 12/19/2016  Component Date Value Ref Range Status  . UREASE 12/19/2016 Positive* Negative Final   Results faxed to site/floor on 12/23/2016 4:08 PM by Delorise Jackson.       Ardath Sax,  MD 

## 2016-12-25 NOTE — Assessment & Plan Note (Signed)
61 y.o. male with mild-moderate tobacco abuse history and known diagnosis of COPD/chronic bronchitis referred to our clinic for evaluation of elevated hemoglobin with suspicion for possible polycythemia development. Review of the trends in hemoglobin demonstrates normal hemoglobin values 2.5 years ago with progressive rise in both the hemoglobin and white blood cell count which are suspicious for possible myeloproliferative neoplasm development. Differential diagnosis includes progressive pulmonary dysfunction with reactive change. Labs obtained at our clinic demonstrated persistent leukocytosis with neutrophilic and monocytic predominance, stable elevated hemoglobin with normocytic normochromic red blood cells and normal level of erythropoietin of 5.6. My expectation would have been for erythropoietin level to be high normal or elevated in the setting of COPD that is confirmed by appearance of the CT scan, so we have obtained a panel of mutation tests by PCR which returned negative for mutations in any of the tested genes including negative tests for JAK2, MPL, or CALR mutations among others. 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 Findings discussed in detail with the patient. The negative mutation testing does not fully rule out presence of myeloproliferative neoplasm, but reduces the likelihood sufficiently to delay the need for bone marrow biopsy as completion of the testing   Plan: --observation --Patient will attempt to quit smoking. We will repeat lab work prior to return to the clinic. If lab abnormalities do not improve or get worse, bone marrow biopsy will be requested.

## 2016-12-25 NOTE — Assessment & Plan Note (Signed)
Dilated intrahepatic biliary ducts discovered on CT of the abdomen. No clear explanation at this time considering normal LFTs. We will obtain a right upper quadrant ultrasound to confirm the finding. If still present, we will refer her to GI for possible MRCP or ERCP evaluation.  Plan: --RtUQ [place]

## 2016-12-29 ENCOUNTER — Other Ambulatory Visit: Payer: Self-pay | Admitting: Cardiology

## 2016-12-30 ENCOUNTER — Encounter (HOSPITAL_COMMUNITY)
Admission: RE | Disposition: A | Discharge status: Home / Self Care | Payer: Self-pay | Source: Ambulatory Visit | Attending: Internal Medicine

## 2016-12-30 ENCOUNTER — Ambulatory Visit (HOSPITAL_COMMUNITY)
Admission: RE | Admit: 2016-12-30 | Discharge: 2016-12-30 | Disposition: A | Discharge status: Home / Self Care | Payer: Medicare Other | Source: Ambulatory Visit | Attending: Internal Medicine | Admitting: Internal Medicine

## 2016-12-30 DIAGNOSIS — R1310 Dysphagia, unspecified: Secondary | ICD-10-CM | POA: Insufficient documentation

## 2016-12-30 HISTORY — PX: ESOPHAGEAL MANOMETRY: SHX5429

## 2016-12-30 SURGERY — MANOMETRY, ESOPHAGUS

## 2016-12-30 MED ORDER — LIDOCAINE VISCOUS 2 % MT SOLN
OROMUCOSAL | Status: AC
  Filled 2016-12-30: qty 15

## 2016-12-30 SURGICAL SUPPLY — 2 items
FACESHIELD LNG OPTICON STERILE (SAFETY) IMPLANT
GLOVE BIO SURGEON STRL SZ8 (GLOVE) ×4 IMPLANT

## 2016-12-30 NOTE — Progress Notes (Signed)
Esophageal Manometry done per protocol . Patient tolerated well without problem or complication. No blood noted on probe when done or when Patient blew nose after. Report to be sent to Dr. Silverio Decamp.

## 2017-01-02 ENCOUNTER — Encounter (HOSPITAL_COMMUNITY): Payer: Self-pay | Admitting: Internal Medicine

## 2017-01-15 ENCOUNTER — Encounter: Payer: Self-pay | Admitting: Diagnostic Radiology

## 2017-01-21 ENCOUNTER — Other Ambulatory Visit: Payer: Self-pay | Admitting: Internal Medicine

## 2017-01-21 NOTE — Telephone Encounter (Signed)
Last refill 12/12/16 Last OV 10/14/16 Ok to refill?

## 2017-01-21 NOTE — Telephone Encounter (Signed)
Sent electronically 

## 2017-02-14 ENCOUNTER — Ambulatory Visit: Payer: Medicare Other | Admitting: Internal Medicine

## 2017-02-14 ENCOUNTER — Other Ambulatory Visit: Payer: Self-pay | Admitting: Internal Medicine

## 2017-02-18 ENCOUNTER — Other Ambulatory Visit: Payer: Self-pay | Admitting: Hematology and Oncology

## 2017-02-18 DIAGNOSIS — D751 Secondary polycythemia: Secondary | ICD-10-CM

## 2017-02-19 ENCOUNTER — Other Ambulatory Visit: Payer: Self-pay

## 2017-02-19 MED ORDER — TRAZODONE HCL 100 MG PO TABS: 100.0000 mg | ORAL_TABLET | Freq: Every day | ORAL | 1 refills | Status: DC

## 2017-02-21 ENCOUNTER — Other Ambulatory Visit: Payer: Self-pay

## 2017-03-02 ENCOUNTER — Other Ambulatory Visit: Payer: Self-pay | Admitting: Cardiology

## 2017-03-03 ENCOUNTER — Encounter: Payer: Self-pay | Admitting: Internal Medicine

## 2017-03-03 ENCOUNTER — Ambulatory Visit (INDEPENDENT_AMBULATORY_CARE_PROVIDER_SITE_OTHER): Payer: Medicare Other | Admitting: Internal Medicine

## 2017-03-03 VITALS — BP 110/64 | HR 55 | Temp 98.2°F | Wt 200.0 lb

## 2017-03-03 DIAGNOSIS — M109 Gout, unspecified: Secondary | ICD-10-CM | POA: Diagnosis not present

## 2017-03-03 MED ORDER — PREDNISONE 20 MG PO TABS: 40.0000 mg | ORAL_TABLET | Freq: Every day | ORAL | 0 refills | Status: DC

## 2017-03-03 NOTE — Progress Notes (Signed)
Subjective:    Patient ID: Joshua [name], male    DOB: [birth year], 61 y.o.   MRN: [id number]  HPI Here with wife due to right hand pain Concerned it might have been gout--the colchicine didn't help Only had gout in big toe in past  Started 2 days ago No trauma  No other treatment  Current Outpatient Medications on File Prior to Visit  Medication Sig Dispense Refill  . aspirin 81 MG chewable tablet Chew 1 tablet (81 mg total) by mouth daily.    [name] atorvastatin (LIPITOR) 80 MG tablet TAKE 1 TABLET BY MOUTH  DAILY AT 6 PM. 90 tablet 0  . bismuth-metronidazole-tetracycline (PYLERA) 140-125-125 MG capsule Take 3 capsules by mouth 4 (four) times daily -  before meals and at bedtime. 168 capsule 0  . bisoprolol (ZEBETA) 5 MG tablet Take 0.5 tablets (2.5 mg total) by mouth daily. Please make yearly appt with Dr. Marlou Porch for January before anymore refills. 2nd attempt 15 tablet 0  . Blood Glucose Monitoring Suppl (ONETOUCH VERIO) w/Device KIT 1 Device by Does not apply route once. 1 kit 0  . clopidogrel (PLAVIX) 75 MG tablet Take 1 tablet (75 mg total) by mouth daily. 94 tablet 3  . colchicine 0.6 MG tablet Take 2 tablets now, then 1 tablet after 1 hour. Can take 1 tab BID thereafter. 30 tablet 0  . fluticasone (FLOVENT HFA) 44 MCG/ACT inhaler Inhale 1 puff into the lungs 2 (two) times daily as needed (shortness of breath).    [name] glucose blood (ONETOUCH VERIO) test strip 1 each by Other route 3 (three) times daily. 100 each 5  . hydrochlorothiazide (HYDRODIURIL) 25 MG tablet TAKE 1 TABLET BY MOUTH  DAILY 90 tablet 0  . levocetirizine (XYZAL) 5 MG tablet TAKE 1 TABLET BY MOUTH  EVERY EVENING 90 tablet 1  . losartan (COZAAR) 25 MG tablet TAKE ONE-HALF TABLET BY  MOUTH DAILY 45 tablet 0  . nitroGLYCERIN (NITROSTAT) 0.4 MG SL tablet Place 1 tablet (0.4 mg total) under the tongue every 5 (five) minutes as needed for chest pain. 25 tablet 4  . Omega-3 Fatty Acids (FISH OIL) 1200 MG CAPS Take 1,200  mg by mouth daily.     [name] omeprazole (PRILOSEC) 40 MG capsule Take 1 capsule (40 mg total) by mouth daily. 90 capsule 3  . ONE TOUCH LANCETS MISC 1 each by Does not apply route 3 (three) times daily. 200 each 5  . predniSONE (DELTASONE) 50 MG tablet 1 tab by mouth 13hrs, 7hrs, and 1 hr prior to the CT scan 3 tablet 0  . pregabalin (LYRICA) 50 MG capsule Take 1 capsule (50 mg total) by mouth 2 (two) times daily. 180 capsule 1  . PROAIR HFA 108 (90 Base) MCG/ACT inhaler USE 2 PUFFS EVERY 6 HOURS  AS NEEDED FOR WHEEZING OR  SHORTNESS OF BREATH 34 g 3  . traZODone (DESYREL) 100 MG tablet Take 1 tablet (100 mg total) by mouth at bedtime. 90 tablet 1  . diphenhydrAMINE (BENADRYL) 50 MG tablet Take 0.5 tablets (25 mg total) by mouth once. 1 hr prior to CT scan 30 tablet 0  . [DISCONTINUED] lisinopril (PRINIVIL,ZESTRIL) 40 MG tablet TAKE 1 TABLET (40 MG TOTAL) BY MOUTH DAILY. 90 tablet 1   No current facility-administered medications on file prior to visit.     Allergies  Allergen Reactions  . Lisinopril Hives and Itching  . Amitriptyline Other (See Comments)    Makes pt feel "weird" and nausea.  [name]  Contrast Media [Iodinated Diagnostic Agents] Hives and Itching    Pt can use if taking benadryl   . Ibuprofen Other (See Comments)    Internal bleeding    Past Medical History:  Diagnosis Date  . AC (acromioclavicular) joint bone spurs, unspecified laterality 11/25/2016  . Arthritis    "back" (05/16/2015)  . CAD in native artery, occluded Lcx and rotational atherectomy to LAD with DES 05/17/15 05/18/2015  . Chronic back pain    "all my back"  . Chronic bronchitis ([place])   . COPD (chronic obstructive pulmonary disease) ([place])   . GERD (gastroesophageal reflux disease)   . H/O blood clots    "had them in my back; put filter in before one of my neck ORs"  . History of gout   . Hyperlipidemia   . Hypertension   . Phlebitis   . Positive TB test   . Seasonal allergies   . Shortness of breath   .  Spinal disease     Past Surgical History:  Procedure Laterality Date  . ANTERIOR CERVICAL DECOMP/DISCECTOMY FUSION  ~ 2001-2009 X 3  . ANTERIOR CERVICAL DECOMP/DISCECTOMY FUSION N/A 10/06/2012   Procedure: ANTERIOR CERVICAL DECOMPRESSION/DISCECTOMY FUSION 1 LEVEL;  Surgeon: [name], MD;  Location: [facility];  Service: Neurosurgery;  Laterality: N/A;  C7T1 anterior cervical decompression with fusion plating and bonegraft   . BACK SURGERY    . CARDIAC CATHETERIZATION  05/16/2015  . CARDIAC CATHETERIZATION N/A 05/16/2015   Procedure: Left Heart Cath and Coronary Angiography;  Surgeon: [name], MD;  Location: [facility];  Service: Cardiovascular;  Laterality: N/A;  . CARDIAC CATHETERIZATION N/A 05/16/2015   Procedure: Coronary Balloon Angioplasty;  Surgeon: [name], MD;  Location: [facility];  Service: Cardiovascular;  Laterality: N/A;  . CARDIAC CATHETERIZATION N/A 05/17/2015   Procedure: Coronary Stent Intervention Rotoblater;  Surgeon: [name], MD;  Location: [facility];  Service: Cardiovascular;  Laterality: N/A;  . ELBOW SURGERY Left 2016   "had bone pieces removed"  . ESOPHAGEAL MANOMETRY N/A 12/30/2016   Procedure: ESOPHAGEAL MANOMETRY (EM);  Surgeon: Irene Shipper, MD;  Location: [facility];  Service: Endoscopy;  Laterality: N/A;  . EXCISIONAL HEMORRHOIDECTOMY    . FEMORAL BYPASS  ~ 2010  . IR RADIOLOGIST EVAL & MGMT  12/17/2016  . IVC FILTER PLACEMENT ([place] [facility])  2010   "had blood clots in my back"  . PERIPHERAL VASCULAR CATHETERIZATION N/A 02/14/2016   Procedure: Abdominal Aortogram w/Lower Extremity;  Surgeon: Wellington Hampshire, MD;  Location: [facility];  Service: Cardiovascular;  Laterality: N/A;  . POSTERIOR LAMINECTOMY / DECOMPRESSION LUMBAR SPINE  2006   "bone spurs"    Family History  Problem Relation Age of Onset  . Stomach cancer Mother        Deceased, 10s  . Hypertension Mother   . Hypertension Father     . Stroke Father   . Heart attack Father        Deceased, 59  . Aneurysm Brother   . Healthy Daughter   . Healthy Maternal Grandmother   . Leukemia Grandchild   . Diabetes Neg Hx   . Early death Neg Hx   . Colon cancer Neg Hx   . Pancreatic cancer Neg Hx     Social History   Socioeconomic History  . Marital status: Married    Spouse name: Not on file  . Number of children: Not on file  .  Years of education: 7  . Highest education level: Not on file  Social Needs  . Financial resource strain: Not on file  . Food insecurity - worry: Not on file  . Food insecurity - inability: Not on file  . Transportation needs - medical: Not on file  . Transportation needs - non-medical: Not on file  Occupational History  . Occupation: Disabled    Employer: DISABLED  Tobacco Use  . Smoking status: Current Every Day Smoker    Packs/day: 0.50    Years: 47.00    Pack years: 23.50    Types: Cigarettes  . Smokeless tobacco: Never Used  Substance and Sexual Activity  . 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 Drug use: No  . Sexual activity: Not Currently  Other Topics Concern  . Not on file  Social History Narrative   Regular exercise-no   Caffeine Use-yes   Lives with wife in a one story home. Has 2 children.  On disability for low back pain.  Used to work as a Games developer, lasted worked in 2007.     Education: 7th grade.   Review of Systems  No fever No change in diet     Objective:   Physical Exam  Constitutional: He appears well-developed. No distress.  Musculoskeletal:  Swelling in right 2nd and 3rd MCP but only 2nd is painful. Slightly reddened Swelling proximal to these joints without sig tenderness No other findings in hand          Assessment & Plan:

## 2017-03-03 NOTE — Patient Instructions (Signed)
Stop the hydrochlorothiazide. Take the 10 days of the prednisone and continue the colchicine (twice a day). After 10 days, if your hand is normal, change back to colchicine only as needed.

## 2017-03-03 NOTE — Assessment & Plan Note (Signed)
Seems most likely given the distribution and history of no trauma Will stop HCTZ ---esp since BP is good Continue colchicine 6 days of prednisone

## 2017-03-04 ENCOUNTER — Ambulatory Visit: Payer: Medicare Other | Admitting: Internal Medicine

## 2017-03-06 ENCOUNTER — Other Ambulatory Visit: Payer: Self-pay | Admitting: Internal Medicine

## 2017-03-06 ENCOUNTER — Other Ambulatory Visit: Payer: Self-pay | Admitting: Cardiology

## 2017-03-13 ENCOUNTER — Ambulatory Visit (INDEPENDENT_AMBULATORY_CARE_PROVIDER_SITE_OTHER)
Admission: RE | Admit: 2017-03-13 | Discharge: 2017-03-13 | Disposition: A | Discharge status: Home / Self Care | Payer: Medicare Other | Source: Ambulatory Visit | Attending: Internal Medicine | Admitting: Internal Medicine

## 2017-03-13 ENCOUNTER — Encounter: Payer: Self-pay | Admitting: Internal Medicine

## 2017-03-13 ENCOUNTER — Telehealth: Payer: Self-pay | Admitting: Internal Medicine

## 2017-03-13 ENCOUNTER — Ambulatory Visit (INDEPENDENT_AMBULATORY_CARE_PROVIDER_SITE_OTHER): Payer: Medicare Other | Admitting: Internal Medicine

## 2017-03-13 VITALS — BP 126/84 | HR 64 | Temp 98.3°F | Wt 206.0 lb

## 2017-03-13 DIAGNOSIS — M25441 Effusion, right hand: Secondary | ICD-10-CM | POA: Diagnosis not present

## 2017-03-13 DIAGNOSIS — M79641 Pain in right hand: Secondary | ICD-10-CM

## 2017-03-13 DIAGNOSIS — M19041 Primary osteoarthritis, right hand: Secondary | ICD-10-CM | POA: Diagnosis not present

## 2017-03-13 NOTE — Progress Notes (Signed)
Subjective:    Patient ID: Joshua [name], male    DOB: 08-17-1955, 61 y.o.   MRN: [id number]  HPI  Pt presents to the clinic today to follow up gout of right hand. He saw Dr. Silvio Pate 12/17 for the same. His HCTZ was stopped. He was given a 10 day course of Prednisone and advised to take Colchicine twice daily. He reports no improvement in his hand pain. He describes the pain as throbbing. The pain radiates up to his elbow. He has difficulty using his left hand. He denies numbness or tingling in her right hand.  Review of Systems      Past Medical History:  Diagnosis Date  . AC (acromioclavicular) joint bone spurs, unspecified laterality 11/25/2016  . Arthritis    "back" (05/16/2015)  . CAD in native artery, occluded Lcx and rotational atherectomy to LAD with DES 05/17/15 05/18/2015  . Chronic back pain    "all my back"  . Chronic bronchitis ([place])   . COPD (chronic obstructive pulmonary disease) ([place])   . GERD (gastroesophageal reflux disease)   . H/O blood clots    "had them in my back; put filter in before one of my neck ORs"  . History of gout   . Hyperlipidemia   . Hypertension   . Phlebitis   . Positive TB test   . Seasonal allergies   . Shortness of breath   . Spinal disease     Current Outpatient Medications  Medication Sig Dispense Refill  . aspirin 81 MG chewable tablet Chew 1 tablet (81 mg total) by mouth daily.    [name] atorvastatin (LIPITOR) 80 MG tablet TAKE 1 TABLET BY MOUTH  DAILY AT 6 PM. 90 tablet 0  . bismuth-metronidazole-tetracycline (PYLERA) 140-125-125 MG capsule Take 3 capsules by mouth 4 (four) times daily -  before meals and at bedtime. 168 capsule 0  . bisoprolol (ZEBETA) 5 MG tablet Take 0.5 tablets (2.5 mg total) by mouth daily. Please make yearly appt with Dr. Marlou Porch for January before anymore refills. 2nd attempt 15 tablet 0  . Blood Glucose Monitoring Suppl (ONETOUCH VERIO) w/Device KIT 1 Device by Does not apply route once. 1 kit 0  . clopidogrel  (PLAVIX) 75 MG tablet Take 1 tablet (75 mg total) by mouth daily. Please make yearly appt with Dr. Fletcher [name] for January before anymore refills. 1st attempt 30 tablet 0  . colchicine 0.6 MG tablet TAKE 2 TABLETS BY MOUTH  NOW, THEN 1 TABLET AFTER 1  HOUR. CAN TAKE 1 TABLET TWO TIMES DAILY THEREAFTER. 30 tablet 0  . diphenhydrAMINE (BENADRYL) 50 MG tablet Take 0.5 tablets (25 mg total) by mouth once. 1 hr prior to CT scan 30 tablet 0  . fluticasone (FLOVENT HFA) 44 MCG/ACT inhaler Inhale 1 puff into the lungs 2 (two) times daily as needed (shortness of breath).    [name] glucose blood (ONETOUCH VERIO) test strip 1 each by Other route 3 (three) times daily. 100 each 5  . levocetirizine (XYZAL) 5 MG tablet TAKE 1 TABLET BY MOUTH  EVERY EVENING 90 tablet 1  . losartan (COZAAR) 25 MG tablet TAKE ONE-HALF TABLET BY  MOUTH DAILY 45 tablet 0  . nitroGLYCERIN (NITROSTAT) 0.4 MG SL tablet Place 1 tablet (0.4 mg total) under the tongue every 5 (five) minutes as needed for chest pain. 25 tablet 4  . Omega-3 Fatty Acids (FISH OIL) 1200 MG CAPS Take 1,200 mg by mouth daily.     [name] omeprazole (PRILOSEC) 40  MG capsule Take 1 capsule (40 mg total) by mouth daily. 90 capsule 3  . ONE TOUCH LANCETS MISC 1 each by Does not apply route 3 (three) times daily. 200 each 5  . predniSONE (DELTASONE) 20 MG tablet Take 2 tablets (40 mg total) by mouth daily. For 5 days, then 1 daily for 5 days. 15 tablet 0  . pregabalin (LYRICA) 50 MG capsule Take 1 capsule (50 mg total) by mouth 2 (two) times daily. 180 capsule 1  . PROAIR HFA 108 (90 Base) MCG/ACT inhaler USE 2 PUFFS EVERY 6 HOURS  AS NEEDED FOR WHEEZING OR  SHORTNESS OF BREATH 34 g 3  . traZODone (DESYREL) 100 MG tablet Take 1 tablet (100 mg total) by mouth at bedtime. 90 tablet 1   No current facility-administered medications for this visit.     Allergies  Allergen Reactions  . Lisinopril Hives and Itching  . Amitriptyline Other (See Comments)    Makes pt feel "weird" and  nausea.  . Contrast Media [Iodinated Diagnostic Agents] Hives and Itching    Pt can use if taking benadryl   . Ibuprofen Other (See Comments)    Internal bleeding    Family History  Problem Relation Age of Onset  . Stomach cancer Mother        Deceased, 69s  . Hypertension Mother   . Hypertension Father   . Stroke Father   . Heart attack Father        Deceased, 22  . Aneurysm Brother   . Healthy Daughter   . Healthy Maternal Grandmother   . Leukemia Grandchild   . Diabetes Neg Hx   . Early death Neg Hx   . Joshua cancer Neg Hx   . Pancreatic cancer Neg Hx     Social History   Socioeconomic History  . Marital status: Married    Spouse name: Not on file  . Number of children: Not on file  . Years of education: 7  . Highest education level: Not on file  Social Needs  . Financial resource strain: Not on file  . Food insecurity - worry: Not on file  . Food insecurity - inability: Not on file  . Transportation needs - medical: Not on file  . Transportation needs - non-medical: Not on file  Occupational History  . Occupation: Disabled    Employer: DISABLED  Tobacco Use  . Smoking status: Current Every Day Smoker    Packs/day: 0.50    Years: 47.00    Pack years: 23.50    Types: Cigarettes  . Smokeless tobacco: Never Used  Substance and Sexual Activity  . 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 Drug use: No  . Sexual activity: Not Currently  Other Topics Concern  . Not on file  Social History Narrative   Regular exercise-no   Caffeine Use-yes   Lives with wife in a one story home. Has 2 children.  On disability for low back pain.  Used to work as a Games developer, lasted worked in 2007.     Education: 7th grade.     Constitutional: Denies fever, malaise, fatigue, headache or abrupt weight changes.  Musculoskeletal: Pt reports joint pain and swelling of right  hand. Denies decrease in range of motion, difficulty with gait, muscle pain.  Skin: Denies redness, rashes, lesions or ulcercations.   No other  specific complaints in a complete review of systems (except as listed in HPI above).  Objective:   Physical Exam  BP 126/84   Pulse 64   Temp 98.3 F (36.8 C) (Oral)   Wt 206 lb (93.4 kg)   SpO2 98%   BMI 30.42 kg/m  Wt Readings from Last 3 Encounters:  03/13/17 206 lb (93.4 kg)  03/03/17 200 lb (90.7 kg)  12/23/16 193 lb 3.2 oz (87.6 kg)    General: Appears his stated age, in NAD. Skin: No redness, warmth noted of joints of right hand. Musculoskeletal: Decreased flexion and extension of the fingers right hand. Swelling and pain noted over the 2nd and 3rd MCP. Hand grips unequal, weaker on right. Neurological: Alert and oriented. Sensation intact to BUE.   BMET    Component Value Date/Time   NA 140 11/25/2016 1430   K 4.3 11/25/2016 1430   CL 101 10/14/2016 1440   CO2 27 11/25/2016 1430   GLUCOSE 101 11/25/2016 1430   BUN 12.6 11/25/2016 1430   CREATININE 0.9 11/25/2016 1430   CALCIUM 10.3 11/25/2016 1430   GFRNONAA >60 05/18/2015 0725   GFRAA >60 05/18/2015 0725    Lipid Panel     Component Value Date/Time   CHOL 147 10/14/2016 1440   TRIG 125.0 10/14/2016 1440   HDL 47.50 10/14/2016 1440   CHOLHDL 3 10/14/2016 1440   VLDL 25.0 10/14/2016 1440   LDLCALC 75 10/14/2016 1440    CBC    Component Value Date/Time   WBC 12.9 (H) 11/25/2016 1430   WBC 12.3 (H) 10/14/2016 1440   RBC 5.94 (H) 11/25/2016 1430   RBC 6.19 (H) 10/14/2016 1440   HGB 19.2 (H) 11/25/2016 1430   HCT 54.8 (H) 11/25/2016 1430   PLT 229 11/25/2016 1430   MCV 92.3 11/25/2016 1430   MCH 32.3 11/25/2016 1430   MCH 29.8 02/13/2016 0949   MCHC 35.0 11/25/2016 1430   MCHC 33.4 10/14/2016 1440   RDW 15.0 (H) 11/25/2016 1430   LYMPHSABS 2.5 11/25/2016 1430   MONOABS 1.3 (H) 11/25/2016 1430   EOSABS 0.3 11/25/2016 1430   BASOSABS 0.0 11/25/2016  1430    Hgb A1C Lab Results  Component Value Date   HGBA1C 5.8 10/14/2016            Assessment & Plan:   Pain and Swelling of Joints of Right Hand:  No improvement with Colchicine and Prednisone Will obtain xray today Encouraged ice, finish course of Prednisone  Will follow up after xray, return precautions discussed Webb Silversmith, NP

## 2017-03-13 NOTE — Telephone Encounter (Signed)
Copied from [place] [id number]. Topic: Quick Communication - See Telephone Encounter >> Mar 13, 2017  4:54 PM Ivar Drape wrote: CRM for notification. See Telephone encounter for:  03/13/17. Patient would like the results of the tests he had today.

## 2017-03-13 NOTE — Patient Instructions (Signed)
Hand Pain  Many things can cause hand pain. Some common causes are:  ? An injury.  ? Repeating the same movement with your hand over and over (overuse).  ? Osteoporosis.  ? Arthritis.  ? Lumps in the tendons or joints of the hand and wrist (ganglion cysts).  ? Infection.  Follow these instructions at home:  Pay attention to any changes in your symptoms. Take these actions to help with your discomfort:  ? If directed, put ice on the affected area:  ? Put ice in a plastic bag.  ? Place a towel between your skin and the bag.  ? Leave the ice on for 15?20 minutes, 3?4 times a day for 2 days.  ? Take over-the-counter and prescription medicines only as told by your health care provider.  ? Minimize stress on your hands and wrists as much as possible.  ? Take breaks from repetitive activity often.  ? Do stretches as told by your health care provider.  ? Do not do activities that make your pain worse.  Contact a health care provider if:  ? Your pain does not get better after a few days of self-care.  ? Your pain gets worse.  ? Your pain affects your ability to do your daily activities.  Get help right away if:  ? Your hand becomes warm, red, or swollen.  ? Your hand is numb or tingling.  ? Your hand is extremely swollen or deformed.  ? Your hand or fingers turn white or blue.  ? You cannot move your hand, wrist, or fingers.  This information is not intended to replace advice given to you by your health care provider. Make sure you discuss any questions you have with your health care provider.  Document Released: 03/31/2015 Document Revised: 08/10/2015 Document Reviewed: 03/30/2014  Elsevier Interactive Patient Education ? 2018 [employer]

## 2017-03-14 NOTE — Telephone Encounter (Signed)
See result note.  

## 2017-03-17 ENCOUNTER — Other Ambulatory Visit: Payer: Self-pay | Admitting: Internal Medicine

## 2017-03-17 DIAGNOSIS — M79641 Pain in right hand: Secondary | ICD-10-CM

## 2017-03-17 DIAGNOSIS — M7989 Other specified soft tissue disorders: Secondary | ICD-10-CM

## 2017-03-19 ENCOUNTER — Other Ambulatory Visit: Payer: Self-pay | Admitting: Internal Medicine

## 2017-03-19 MED ORDER — PREGABALIN 50 MG PO CAPS: 50.0000 mg | ORAL_CAPSULE | Freq: Two times a day (BID) | ORAL | 1 refills | Status: DC

## 2017-03-19 NOTE — Addendum Note (Signed)
Addended by: Lurlean Nanny on: 03/19/2017 11:39 AM   Modules accepted: Orders

## 2017-03-19 NOTE — Telephone Encounter (Signed)
Copied from [place] [id number]. Topic: Quick Communication - Rx Refill/Question >> Mar 19, 2017  9:44 AM Synthia Innocent wrote: Has the patient contacted their pharmacy? Yes.     (Agent: If no, request that the patient contact the pharmacy for the refill.)   Preferred Pharmacy (with phone number or street name): [facility]   Agent: Please be advised that RX refills may take up to 3 business days. We ask that you follow-up with your pharmacy. Requesting refill on pregabalin (LYRICA) 50 MG capsule

## 2017-03-19 NOTE — Telephone Encounter (Signed)
Rx faxed to mail order

## 2017-03-19 NOTE — Telephone Encounter (Signed)
Please phone in Lyrica

## 2017-03-19 NOTE — Telephone Encounter (Signed)
Last Rx 08/30/2016 #180 1R. Last OV 02/2017

## 2017-03-19 NOTE — Telephone Encounter (Signed)
Patient requesting refill of Lyrica sent to [facility]

## 2017-03-25 ENCOUNTER — Other Ambulatory Visit: Payer: Medicare Other

## 2017-03-25 ENCOUNTER — Ambulatory Visit: Payer: Medicare Other | Admitting: Hematology and Oncology

## 2017-03-26 ENCOUNTER — Telehealth: Payer: Self-pay | Admitting: Hematology and Oncology

## 2017-03-26 NOTE — Telephone Encounter (Signed)
Patient called to cancel °

## 2017-03-28 ENCOUNTER — Other Ambulatory Visit: Payer: Medicare Other

## 2017-03-28 ENCOUNTER — Ambulatory Visit: Payer: Medicare Other | Admitting: Hematology and Oncology

## 2017-04-04 ENCOUNTER — Other Ambulatory Visit: Payer: Self-pay | Admitting: Cardiology

## 2017-05-13 ENCOUNTER — Telehealth: Payer: Self-pay | Admitting: *Deleted

## 2017-05-13 NOTE — Telephone Encounter (Signed)
Left a message to call back to set up his follow up appointment with Dr. Fletcher [name].

## 2017-05-29 NOTE — Telephone Encounter (Signed)
Left a message to call back to schedule annual follow up.

## 2017-06-04 ENCOUNTER — Other Ambulatory Visit: Payer: Self-pay | Admitting: Internal Medicine

## 2017-06-17 ENCOUNTER — Other Ambulatory Visit: Payer: Self-pay | Admitting: Internal Medicine

## 2017-07-16 ENCOUNTER — Other Ambulatory Visit: Payer: Self-pay | Admitting: Internal Medicine

## 2017-09-24 ENCOUNTER — Other Ambulatory Visit: Payer: Self-pay | Admitting: Internal Medicine

## 2017-10-06 ENCOUNTER — Other Ambulatory Visit: Payer: Self-pay | Admitting: Cardiology

## 2017-10-10 ENCOUNTER — Telehealth: Payer: Self-pay | Admitting: Cardiology

## 2017-10-10 NOTE — Telephone Encounter (Signed)
[facility] mail order pharmacy is needing clarification. Pharmacy wanted to know if Dr. Marlou Porch if aware of concurrent use of lipitor (Atorvastatin) 80 mg tablet and Colchicine 0.6 mg tablet may cause myopathy and/or rhabdomyolysis. Pharmacy would like a call back at [phone], order # [id number]. Please address

## 2017-10-13 NOTE — Telephone Encounter (Signed)
OK to take.  Watch for any signs of myalgias however.  Discussed with pharmacy team. Candee Furbish, MD

## 2017-10-14 NOTE — Telephone Encounter (Signed)
Responded via fax.

## 2017-10-14 NOTE — Telephone Encounter (Signed)
This was already discussed with Dr Marlou Porch. Pt ok to continue both meds and watch for any myalgias.

## 2017-11-11 ENCOUNTER — Ambulatory Visit: Payer: Medicare Other | Admitting: Cardiovascular Disease

## 2017-11-11 VITALS — BP 142/88 | HR 98 | Ht 69.0 in | Wt 197.0 lb

## 2017-11-11 DIAGNOSIS — I10 Essential (primary) hypertension: Secondary | ICD-10-CM

## 2017-11-11 DIAGNOSIS — Z79899 Other long term (current) drug therapy: Secondary | ICD-10-CM

## 2017-11-11 DIAGNOSIS — I739 Peripheral vascular disease, unspecified: Secondary | ICD-10-CM | POA: Diagnosis not present

## 2017-11-11 DIAGNOSIS — Z720 Tobacco use: Secondary | ICD-10-CM

## 2017-11-11 DIAGNOSIS — E785 Hyperlipidemia, unspecified: Secondary | ICD-10-CM

## 2017-11-11 DIAGNOSIS — R0989 Other specified symptoms and signs involving the circulatory and respiratory systems: Secondary | ICD-10-CM | POA: Diagnosis not present

## 2017-11-11 DIAGNOSIS — I2510 Atherosclerotic heart disease of native coronary artery without angina pectoris: Secondary | ICD-10-CM

## 2017-11-11 MED ORDER — CLOPIDOGREL BISULFATE 75 MG PO TABS: 75.0000 mg | ORAL_TABLET | Freq: Every day | ORAL | 0 refills | Status: DC

## 2017-11-11 MED ORDER — ASPIRIN 81 MG PO CHEW: 81.0000 mg | CHEWABLE_TABLET | Freq: Every day | ORAL | Status: DC

## 2017-11-11 MED ORDER — ATORVASTATIN CALCIUM 80 MG PO TABS: 80.0000 mg | ORAL_TABLET | Freq: Every day | ORAL | 3 refills | Status: DC

## 2017-11-11 MED ORDER — LOSARTAN POTASSIUM 50 MG PO TABS: 50.0000 mg | ORAL_TABLET | Freq: Every day | ORAL | 3 refills | Status: DC

## 2017-11-11 NOTE — Patient Instructions (Signed)
Medication Instructions:  Your physician has recommended you make the following change in your medication:  1) RESTART Plavix 75 mg tablet by mouth ONCE daily 2) RESTART Asprin 81 mg tablet by mouth ONCE daily 3) START Losartan 50 mg tablet by mouth ONCE daily    Labwork: Your physician recommends that you return for lab work in: 1 week - FASTING (Lipid/liver/bmet)   Testing/Procedures: Your physician has requested that you have an aorta/iliac duplex. During this test, an ultrasound is used to evaluate blood flow to the aorta and iliac arteries. Allow one hour for this exam. Do not eat after midnight the day before and avoid carbonated beverages.   Your physician has requested that you have an ankle brachial index (ABI). During this test an ultrasound and blood pressure cuff are used to evaluate the arteries that supply the arms and legs with blood. Allow thirty minutes for this exam. There are no restrictions or special instructions.  Your physician has requested that you have a carotid duplex. This test is an ultrasound of the carotid arteries in your neck. It looks at blood flow through these arteries that supply the brain with blood. Allow one hour for this exam. There are no restrictions or special instructions.    Follow-Up: Your physician recommends that you schedule a follow-up appointment in: 6 months with Dr. Fletcher [name].    Any Other Special Instructions Will Be Listed Below (If Applicable).     If you need a refill on your cardiac medications before your next appointment, please call your pharmacy.

## 2017-11-11 NOTE — Progress Notes (Signed)
Cardiology Office Note   Date:  11/11/2017   ID:  Joshua [name], DOB 06-Apr-1955, MRN [id number]  PCP:  Jearld Fenton, NP  Cardiologist:  Dr. Marlou Porch  No chief complaint on file.     History of Present Illness: Joshua [name] is a 62 y.o. male who is here today for a follow up visit regarding peripheral arterial disease. He has known history of coronary artery disease status post rotational atherectomy and drug-eluting stent placement to the mid LAD. He has known history of peripheral arterial disease status post aortobifemoral bypass in 2010 at [place], diabetes mellitus with diabetic neuropathy, hyperlipidemia and tobacco use. He was seen 6 in 2017 for foot burning sensation and throbbing with chronic bluish discoloration of his feet for many years.  Noninvasive vascular evaluation in March  showed normal ABI bilaterally, patent aorto bifemoral bypass and moderate left SFA stenosis. Angiography in 01/2016 showed patent aortobifemoral bypass with moderate stenosis affecting the left common femoral artery and ostial left SFA. There was borderline significant diffuse disease affecting the right SFA. These findings did not explain his symptoms.  The velocity at the left common femoral artery was moderately elevated by duplex.  The patient has not been seen since January 2018.  He stopped taking most of his medications due to concerns about cost.  He is not able to afford any brand-name medications.  He denies any chest pain.  He does complain of chronic dyspnea but he continues to smoke half a pack per day and has noticed increased morning coughing and congestion.  He complains of dizziness. He reports that hydrochlorothiazide was discontinued due to gout.  Past Medical History:  Diagnosis Date  . AC (acromioclavicular) joint bone spurs, unspecified laterality 11/25/2016  . Arthritis    "back" (05/16/2015)  . CAD in native artery, occluded Lcx and rotational atherectomy to LAD with  DES 05/17/15 05/18/2015  . Chronic back pain    "all my back"  . Chronic bronchitis ([place])   . COPD (chronic obstructive pulmonary disease) ([place])   . GERD (gastroesophageal reflux disease)   . H/O blood clots    "had them in my back; put filter in before one of my neck ORs"  . History of gout   . Hyperlipidemia   . Hypertension   . Phlebitis   . Positive TB test   . Seasonal allergies   . Shortness of breath   . Spinal disease     Past Surgical History:  Procedure Laterality Date  . ANTERIOR CERVICAL DECOMP/DISCECTOMY FUSION  ~ 2001-2009 X 3  . ANTERIOR CERVICAL DECOMP/DISCECTOMY FUSION N/A 10/06/2012   Procedure: ANTERIOR CERVICAL DECOMPRESSION/DISCECTOMY FUSION 1 LEVEL;  Surgeon: [name], MD;  Location: [facility];  Service: Neurosurgery;  Laterality: N/A;  C7T1 anterior cervical decompression with fusion plating and bonegraft   . BACK SURGERY    . CARDIAC CATHETERIZATION  05/16/2015  . CARDIAC CATHETERIZATION N/A 05/16/2015   Procedure: Left Heart Cath and Coronary Angiography;  Surgeon: [name], MD;  Location: [facility];  Service: Cardiovascular;  Laterality: N/A;  . CARDIAC CATHETERIZATION N/A 05/16/2015   Procedure: Coronary Balloon Angioplasty;  Surgeon: [name], MD;  Location: [facility];  Service: Cardiovascular;  Laterality: N/A;  . CARDIAC CATHETERIZATION N/A 05/17/2015   Procedure: Coronary Stent Intervention Rotoblater;  Surgeon: [name], MD;  Location: [facility];  Service: Cardiovascular;  Laterality: N/A;  . ELBOW SURGERY Left 2016   "  had bone pieces removed"  . ESOPHAGEAL MANOMETRY N/A 12/30/2016   Procedure: ESOPHAGEAL MANOMETRY (EM);  Surgeon: Irene Shipper, MD;  Location: [facility];  Service: Endoscopy;  Laterality: N/A;  . EXCISIONAL HEMORRHOIDECTOMY    . FEMORAL BYPASS  ~ 2010  . IR RADIOLOGIST EVAL & MGMT  12/17/2016  . IVC FILTER PLACEMENT ([facility])  2010   "had blood clots in my back"  . PERIPHERAL  VASCULAR CATHETERIZATION N/A 02/14/2016   Procedure: Abdominal Aortogram w/Lower Extremity;  Surgeon: Wellington Hampshire, MD;  Location: [facility];  Service: Cardiovascular;  Laterality: N/A;  . POSTERIOR LAMINECTOMY / DECOMPRESSION LUMBAR SPINE  2006   "bone spurs"     Current Outpatient Medications  Medication Sig Dispense Refill  . atorvastatin (LIPITOR) 80 MG tablet Take 1 tablet (80 mg total) by mouth daily at 6 PM. Please make overdue appt with Dr. Marlou Porch before anymore refills. 3rd and Final Attempt 15 tablet 0  . bismuth-metronidazole-tetracycline (PYLERA) 140-125-125 MG capsule Take 3 capsules by mouth 4 (four) times daily -  before meals and at bedtime. 168 capsule 0  . bisoprolol (ZEBETA) 5 MG tablet Take 0.5 tablets (2.5 mg total) by mouth daily. Please make yearly appt with Dr. Marlou Porch for January before anymore refills. 2nd attempt 15 tablet 0  . Blood Glucose Monitoring Suppl (ONETOUCH VERIO) w/Device KIT 1 Device by Does not apply route once. 1 kit 0  . colchicine 0.6 MG tablet TAKE 2 TABLETS BY MOUTH  NOW, THEN 1 TABLET AFTER 1  HOUR. CAN TAKE 1 TABLET TWO TIMES DAILY THEREAFTER. 30 tablet 0  . colchicine 0.6 MG tablet TAKE 2 TABLETS NOW, THEN 1 TABLET AFTER 1 HOUR. CAN TAKE 1 TAB TWICE A DAY THEREAFTER. 30 tablet 0  . fluticasone (FLOVENT HFA) 44 MCG/ACT inhaler Inhale 1 puff into the lungs 2 (two) times daily as needed (shortness of breath).    [name] levocetirizine (XYZAL) 5 MG tablet TAKE 1 TABLET BY MOUTH  EVERY EVENING 90 tablet 1  . nitroGLYCERIN (NITROSTAT) 0.4 MG SL tablet Place 1 tablet (0.4 mg total) under the tongue every 5 (five) minutes as needed for chest pain. 25 tablet 4  . Omega-3 Fatty Acids (FISH OIL) 1200 MG CAPS Take 1,200 mg by mouth daily.     [name] omeprazole (PRILOSEC) 40 MG capsule Take 1 capsule (40 mg total) by mouth daily. 90 capsule 3  . ONE TOUCH LANCETS MISC 1 each by Does not apply route 3 (three) times daily. 200 each 5  . ONETOUCH VERIO test  strip USE TO TEST 3 TIMES DAILY 100 each 5  . ONETOUCH VERIO test strip USE TO TEST 3 TIMES DAILY 300 each 0  . pregabalin (LYRICA) 50 MG capsule Take 1 capsule (50 mg total) by mouth 2 (two) times daily. 180 capsule 1  . PROAIR HFA 108 (90 Base) MCG/ACT inhaler USE 2 PUFFS EVERY 6 HOURS  AS NEEDED FOR WHEEZING OR  SHORTNESS OF BREATH 34 g 3  . traZODone (DESYREL) 100 MG tablet Take 1 tablet (100 mg total) by mouth at bedtime. 90 tablet 1  . aspirin 81 MG chewable tablet Chew 1 tablet (81 mg total) by mouth daily. (Patient not taking: Reported on 11/11/2017)    . clopidogrel (PLAVIX) 75 MG tablet Take 1 tablet (75 mg total) by mouth daily. Please make yearly appt with Dr. Fletcher [name] for January before anymore refills. 1st attempt (Patient not taking: Reported on 11/11/2017) 30 tablet 0  .  diphenhydrAMINE (BENADRYL) 50 MG tablet Take 0.5 tablets (25 mg total) by mouth once. 1 hr prior to CT scan 30 tablet 0  . hydrochlorothiazide (HYDRODIURIL) 25 MG tablet Take 1 tablet (25 mg total) by mouth daily. Please make yearly appt with Dr. Fletcher [name] for January before anymore refills. 1st attempt (Patient not taking: Reported on 11/11/2017) 30 tablet 0  . losartan (COZAAR) 25 MG tablet Take 0.5 tablets (12.5 mg total) by mouth daily. Please make yearly appt with Dr. Fletcher [name] for January before anymore refills. 1st attempt (Patient not taking: Reported on 11/11/2017) 15 tablet 0   No current facility-administered medications for this visit.     Allergies:   Lisinopril; Amitriptyline; Contrast media [iodinated diagnostic agents]; and Ibuprofen    Social History:  The patient  reports that he has been smoking cigarettes. He has a 23.50 pack-year smoking history. He has never used smokeless tobacco. He reports that he drinks alcohol. He reports that he does not use drugs.   Family History:  The patient's family history includes Aneurysm in his brother; Healthy in his daughter and maternal grandmother; Heart attack in his  father; Hypertension in his father and mother; Leukemia in his grandchild; Stomach cancer in his mother; Stroke in his father.    ROS:  Please see the history of present illness.   Otherwise, review of systems are positive for none.   All other systems are reviewed and negative.    PHYSICAL EXAM: VS:  BP (!) 142/88 (BP Location: Left Arm, Patient Position: Sitting, Cuff Size: Normal)   Pulse 98   Ht _0  (1.753 m)   Wt 197 lb (89.4 kg)   BMI 29.09 kg/m  , BMI Body mass index is 29.09 kg/m. GEN: Well nourished, well developed, in no acute distress  HEENT: normal  Neck: no JVD, carotid bruits, or masses Cardiac: RRR; no murmurs, rubs, or gallops,no edema  Respiratory:  clear to auscultation bilaterally, normal work of breathing GI: soft, nontender, nondistended, + BS MS: no deformity or atrophy  Skin: warm and dry, no rash Neuro:  Strength and sensation are intact Psych: euthymic mood, full affect Vascular: Femoral pulses are normal bilaterally. Distal pulses are not palpable with minimal edema and stasis dermatitis.  EKG:  EKG is ordered today. EKG showed normal sinus rhythm with possible left atrial enlargement.  No significant ST or T wave changes.  Recent Labs: 11/25/2016: ALT 20; BUN 12.6; Creatinine 0.9; HGB 19.2; Platelets 229; Potassium 4.3; Sodium 140    Lipid Panel    Component Value Date/Time   CHOL 147 10/14/2016 1440   TRIG 125.0 10/14/2016 1440   HDL 47.50 10/14/2016 1440   CHOLHDL 3 10/14/2016 1440   VLDL 25.0 10/14/2016 1440   LDLCALC 75 10/14/2016 1440   LDLDIRECT 107.0 04/21/2015 1352      Wt Readings from Last 3 Encounters:  11/11/17 197 lb (89.4 kg)  03/13/17 206 lb (93.4 kg)  03/03/17 200 lb (90.7 kg)         ASSESSMENT AND PLAN:  1.   Peripheral arterial disease: Status post aortobifemoral bypass with known moderate disease affecting the left common femoral and ostial left SFA and borderline diffuse disease affecting the right SFA.  His  symptoms continue to be consistent with peripheral neuropathy.  Nonetheless, he is due for ABI and aortoiliac duplex.  2. Coronary artery disease: Status post atherectomy and drug-eluting stent placement to the LAD.  The patient has no angina.  I advised him to resume  aspirin 81 mg once daily and also I am going to resume Plavix 75 mg once daily given his CAD and PAD.  3. Tobacco use: He is down to a quarter pack per day and is trying to quit. I again discussed with him the importance of smoking cessation  4. Hyperlipidemia: Currently on high dose atorvastatin.  We will do lipid and liver profile in 1 to 2 weeks.  5.  Essential hypertension: Blood pressure is elevated as he stopped taking his antihypertensive medications.  I am going to resume losartan 50 mg once daily.  Check basic metabolic profile in 1 week.  6.  Left carotid bruit with dizziness.  I requested carotid Doppler.   Disposition:   FU with me in 6 months  Signed,  Kathlyn Sacramento, MD  11/11/2017 8:34 AM    [place]

## 2017-11-13 ENCOUNTER — Other Ambulatory Visit: Payer: Self-pay | Admitting: Internal Medicine

## 2017-11-20 ENCOUNTER — Ambulatory Visit: Payer: Self-pay | Admitting: Internal Medicine

## 2017-11-20 NOTE — Telephone Encounter (Signed)
Called pt and left message to call back. Will close out triage call.

## 2017-11-21 DIAGNOSIS — Z79899 Other long term (current) drug therapy: Secondary | ICD-10-CM | POA: Diagnosis not present

## 2017-11-21 DIAGNOSIS — I10 Essential (primary) hypertension: Secondary | ICD-10-CM | POA: Diagnosis not present

## 2017-11-21 DIAGNOSIS — I739 Peripheral vascular disease, unspecified: Secondary | ICD-10-CM | POA: Diagnosis not present

## 2017-11-21 LAB — BASIC METABOLIC PANEL
BUN / CREAT RATIO: 7 — AB (ref 10–24)
BUN: 16 mg/dL (ref 8–27)
CO2: 21 mmol/L (ref 20–29)
Calcium: 8.8 mg/dL (ref 8.6–10.2)
Chloride: 96 mmol/L (ref 96–106)
Creatinine, Ser: 2.15 mg/dL — ABNORMAL HIGH (ref 0.76–1.27)
GFR calc Af Amer: 37 mL/min/{1.73_m2} — ABNORMAL LOW (ref >59)
GFR calc non Af Amer: 32 mL/min/{1.73_m2} — ABNORMAL LOW (ref >59)
Glucose: 84 mg/dL (ref 65–99)
Potassium: 3.5 mmol/L (ref 3.5–5.2)
SODIUM: 143 mmol/L (ref 134–144)

## 2017-11-21 LAB — LIPID PANEL
Chol/HDL Ratio: 1.7 ratio (ref 0.0–5.0)
Cholesterol, Total: 215 mg/dL — ABNORMAL HIGH (ref 100–199)
HDL: 126 mg/dL (ref >39)
LDL Calculated: 70 mg/dL (ref 0–99)
Triglycerides: 94 mg/dL (ref 0–149)
VLDL Cholesterol Cal: 19 mg/dL (ref 5–40)

## 2017-11-21 LAB — HEPATIC FUNCTION PANEL
ALK PHOS: 114 IU/L (ref 39–117)
ALT: 119 IU/L — ABNORMAL HIGH (ref 0–44)
AST: 108 IU/L — ABNORMAL HIGH (ref 0–40)
Albumin: 4.2 g/dL (ref 3.6–4.8)
BILIRUBIN, DIRECT: 0.31 mg/dL (ref 0.00–0.40)
Bilirubin Total: 0.8 mg/dL (ref 0.0–1.2)
TOTAL PROTEIN: 6.7 g/dL (ref 6.0–8.5)

## 2017-11-24 ENCOUNTER — Ambulatory Visit (HOSPITAL_COMMUNITY)
Admission: RE | Admit: 2017-11-24 | Discharge: 2017-11-24 | Disposition: A | Discharge status: Home / Self Care | Payer: Medicare Other | Source: Ambulatory Visit | Attending: Cardiology | Admitting: Cardiology

## 2017-11-24 ENCOUNTER — Other Ambulatory Visit: Payer: Self-pay | Admitting: Cardiovascular Disease

## 2017-11-24 ENCOUNTER — Ambulatory Visit (HOSPITAL_BASED_OUTPATIENT_CLINIC_OR_DEPARTMENT_OTHER)
Admission: RE | Admit: 2017-11-24 | Discharge: 2017-11-24 | Disposition: A | Discharge status: Home / Self Care | Payer: Medicare Other | Source: Ambulatory Visit | Attending: Cardiovascular Disease | Admitting: Cardiovascular Disease

## 2017-11-24 DIAGNOSIS — R0989 Other specified symptoms and signs involving the circulatory and respiratory systems: Secondary | ICD-10-CM

## 2017-11-24 DIAGNOSIS — Z95828 Presence of other vascular implants and grafts: Secondary | ICD-10-CM

## 2017-11-24 DIAGNOSIS — Z79899 Other long term (current) drug therapy: Secondary | ICD-10-CM | POA: Diagnosis not present

## 2017-11-24 DIAGNOSIS — I739 Peripheral vascular disease, unspecified: Secondary | ICD-10-CM | POA: Diagnosis not present

## 2017-11-25 ENCOUNTER — Telehealth: Payer: Self-pay | Admitting: Internal Medicine

## 2017-11-25 NOTE — Telephone Encounter (Signed)
Left message for pt to return call to office. Noted in chart that pt was called by triage nurse on 9/5 and a message was left for the pt to return call to discuss symptoms of dizziness.

## 2017-11-25 NOTE — Telephone Encounter (Signed)
Copied from [place] [id number]. Topic: General - Other >> Nov 25, 2017  5:07 PM Yvette Rack wrote: Reason for CRM: Pt returned call. Pt requests call back. Cb# [phone]

## 2017-11-28 ENCOUNTER — Telehealth: Payer: Self-pay | Admitting: *Deleted

## 2017-11-28 DIAGNOSIS — I2510 Atherosclerotic heart disease of native coronary artery without angina pectoris: Secondary | ICD-10-CM

## 2017-11-28 NOTE — Telephone Encounter (Signed)
-----   Message from Wellington Hampshire, MD sent at 11/27/2017  5:58 PM EDT ----- Inform patient that labs showed abnormal liver enzymes.  Is he consuming too much alcohol?  We should decrease atorvastatin to 40 mg daily. Also his renal function was abnormal.  I recommend stopping losartan and adding amlodipine 5 mg daily instead.  He should increase fluid intake. Repeat basic metabolic profile and liver profile in 2 weeks.

## 2017-11-28 NOTE — Telephone Encounter (Signed)
See below patient returning call.

## 2017-11-28 NOTE — Telephone Encounter (Signed)
Left voicemail message to call back  

## 2017-11-28 NOTE — Telephone Encounter (Signed)
Spoke with patients wife per release form and reviewed lab results and medication instructions. She admits that the patient has some heavy drinking everyday. Discussed the importance of withholding from alcohol  Reviewed that he should decrease atorvastatin down to 1/2 tablet 40 mg once daily and then to stop the losartan all together and start amlodipine 5 mg once daily. Confirmed pharmacy and she is requesting they be sent to his mail order pharmacy. She verbalized understanding to get labs rechecked in 2 weeks at providers office. No further questions at this time.

## 2017-12-02 ENCOUNTER — Telehealth: Payer: Self-pay | Admitting: Cardiovascular Disease

## 2017-12-02 ENCOUNTER — Telehealth: Payer: Self-pay | Admitting: *Deleted

## 2017-12-02 DIAGNOSIS — Z79899 Other long term (current) drug therapy: Secondary | ICD-10-CM

## 2017-12-02 DIAGNOSIS — I739 Peripheral vascular disease, unspecified: Secondary | ICD-10-CM

## 2017-12-02 DIAGNOSIS — I10 Essential (primary) hypertension: Secondary | ICD-10-CM

## 2017-12-02 MED ORDER — ATORVASTATIN CALCIUM 40 MG PO TABS: 40.0000 mg | ORAL_TABLET | Freq: Every day | ORAL | 1 refills | Status: DC

## 2017-12-02 MED ORDER — AMLODIPINE BESYLATE 5 MG PO TABS: 5.0000 mg | ORAL_TABLET | Freq: Every day | ORAL | 1 refills | Status: DC

## 2017-12-02 NOTE — Telephone Encounter (Signed)
Patient called regarding medication changes that were to be made after labs last week. Sent Rx for Amlodipine and decreased dose of Atorvastatin to mail order as requested.

## 2017-12-02 NOTE — Telephone Encounter (Signed)
Left a message for the patient to call back.  

## 2017-12-02 NOTE — Telephone Encounter (Signed)
New message   Pt c/o medication issue:  1. Name of Medication:losartan (COZAAR) 50 MG tablet  2. How are you currently taking this medication (dosage and times per day)?1 time daily  3. Are you having a reaction (difficulty breathing--STAT)?no   4. What is your medication issue?patient wants to know what the replacement is for medication. He was advised to stop taking this medication. He states that he has called the pharmacy and a replacement has not been called in.

## 2017-12-02 NOTE — Telephone Encounter (Signed)
-----   Message from Joshua Hampshire, MD sent at 12/01/2017  1:56 PM EDT ----- Inform patient that ABI has decreased from before especially on the right side.  Scheduled him for follow-up with me in 1 month to readdress this hopefully after his kidney function improves.

## 2017-12-03 NOTE — Telephone Encounter (Signed)
Patient made aware of results and verbalized understanding. Appointment made for October 1st with Dr. Fletcher [name].

## 2017-12-12 ENCOUNTER — Other Ambulatory Visit: Payer: Self-pay | Admitting: *Deleted

## 2017-12-12 DIAGNOSIS — I2510 Atherosclerotic heart disease of native coronary artery without angina pectoris: Secondary | ICD-10-CM

## 2017-12-12 LAB — HEPATIC FUNCTION PANEL
ALBUMIN: 4.3 g/dL (ref 3.6–4.8)
ALT: 103 IU/L — ABNORMAL HIGH (ref 0–44)
AST: 78 IU/L — ABNORMAL HIGH (ref 0–40)
Alkaline Phosphatase: 104 IU/L (ref 39–117)
Bilirubin Total: 0.4 mg/dL (ref 0.0–1.2)
Bilirubin, Direct: 0.19 mg/dL (ref 0.00–0.40)
TOTAL PROTEIN: 6.5 g/dL (ref 6.0–8.5)

## 2017-12-12 LAB — BASIC METABOLIC PANEL
BUN/Creatinine Ratio: 11 (ref 10–24)
BUN: 9 mg/dL (ref 8–27)
CO2: 26 mmol/L (ref 20–29)
CREATININE: 0.85 mg/dL (ref 0.76–1.27)
Calcium: 9.4 mg/dL (ref 8.6–10.2)
Chloride: 100 mmol/L (ref 96–106)
GFR calc Af Amer: 109 mL/min/{1.73_m2} (ref >59)
GFR, EST NON AFRICAN AMERICAN: 94 mL/min/{1.73_m2} (ref >59)
GLUCOSE: 93 mg/dL (ref 65–99)
POTASSIUM: 4.6 mmol/L (ref 3.5–5.2)
Sodium: 145 mmol/L — ABNORMAL HIGH (ref 134–144)

## 2017-12-16 ENCOUNTER — Encounter: Payer: Self-pay | Admitting: Cardiovascular Disease

## 2017-12-16 ENCOUNTER — Ambulatory Visit: Payer: Medicare Other | Admitting: Cardiovascular Disease

## 2017-12-16 VITALS — BP 145/99 | HR 100 | Ht 69.0 in | Wt 196.7 lb

## 2017-12-16 DIAGNOSIS — I10 Essential (primary) hypertension: Secondary | ICD-10-CM | POA: Diagnosis not present

## 2017-12-16 DIAGNOSIS — I2510 Atherosclerotic heart disease of native coronary artery without angina pectoris: Secondary | ICD-10-CM

## 2017-12-16 DIAGNOSIS — E785 Hyperlipidemia, unspecified: Secondary | ICD-10-CM

## 2017-12-16 DIAGNOSIS — I739 Peripheral vascular disease, unspecified: Secondary | ICD-10-CM

## 2017-12-16 DIAGNOSIS — Z720 Tobacco use: Secondary | ICD-10-CM

## 2017-12-16 MED ORDER — DOXYCYCLINE HYCLATE 100 MG PO CAPS: 100.0000 mg | ORAL_CAPSULE | Freq: Two times a day (BID) | ORAL | 0 refills | Status: DC

## 2017-12-16 MED ORDER — PREDNISONE 50 MG PO TABS: ORAL_TABLET | ORAL | 0 refills | Status: DC

## 2017-12-16 NOTE — Patient Instructions (Signed)
Medication Instructions:  Your physician has recommended you make the following change in your medication:  START Doxycycline 17m twice daily for 5 days. An Rx has been sent to your pharmacy  START Prednisone as directed prior to your PV procedure/ and Benadryl   Labwork: None ordered  Testing/Procedures: Your physician has requested that you have an abdominal aortography with LE angio   Follow-Up: Your physician recommends that you schedule a follow-up appointment following procedure   Any Other Special Instructions Will Be Listed Below (If Applicable).     If you need a refill on your cardiac medications before your next appointment, please call your pharmacy.     [id number]: [phone]: 3[place] 12/16/2017  You are scheduled for a Peripheral Angiogram on Tuesday, October 22 with Dr. MKathlyn Sacramento  1. [facility] [address] [place] [id number] 6:30 AM (This time is two hours before your procedure to ensure your preparation). Free valet parking service is available.   Special note: Every effort is made to have your procedure done on time. Please understand that emergencies sometimes delay scheduled procedures.  2. Diet: Do not eat solid foods after midnight.  The patient may have clear liquids until 5am upon the day of the procedure.  3.  4. Medication instructions in preparation for your procedure:   Contrast Allergy: Yes, Please take Prednisone 568mby mouth at: Thirteen hours prior to cath 7:00pm on Monday Seven hours prior to cath 1:00am on Tuesday And prior to leaving home please take last dose of Prednisone 5039mnd Benadryl 1m55m mouth.       Current Outpatient Medications (Endocrine & Metabolic):  .  predniSONE  (DELTASONE) 50 MG tablet, Take 1 tablet 13 hours prior to procedure, Take 1 tablet 7 hours prior to procedure, Take 1 tablet 1 hour prior to procedure  Current Outpatient Medications (Cardiovascular):  .  amLODipine (NORVASC) 5 MG tablet, Take 1 tablet (5 mg total) by mouth daily. .  aMarland Kitchenorvastatin (LIPITOR) 40 MG tablet, Take 1 tablet (40 mg total) by mouth daily at 6 PM. .  nitroGLYCERIN (NITROSTAT) 0.4 MG SL tablet, Place 1 tablet (0.4 mg total) under the tongue every 5 (five) minutes as needed for chest pain.  Current Outpatient Medications (Respiratory):  .  fluticasone (FLOVENT HFA) 44 MCG/ACT inhaler, Inhale 1 puff into the lungs 2 (two) times daily as needed (shortness of breath). .  PMarland KitchenOAIR HFA 108 (90 Base) MCG/ACT inhaler, USE 2 PUFFS EVERY 6 HOURS  AS NEEDED FOR WHEEZING OR  SHORTNESS OF BREATH .  diphenhydrAMINE (BENADRYL) 50 MG tablet, Take 0.5 tablets (25 mg total) by mouth once. 1 hr prior to CT scan  Current Outpatient Medications (Analgesics):  .  aspirin 81 MG chewable tablet, Chew 1 tablet (81 mg total) by mouth daily.  Current Outpatient Medications (Hematological):  .  clopidogrel (PLAVIX) 75 MG tablet, Take 1 tablet (75 mg total) by mouth daily. Please make yearly appt with Dr. AridFletcher [name] January before anymore refills. 1st attempt  Current Outpatient Medications (Other):  .  Blood Glucose Monitoring Suppl (ONETOUCH VERIO) w/Device KIT, 1 Device by Does not apply route once. .  Omega-3 Fatty Acids (FISH OIL) 1200 MG CAPS, Take 1,200 mg by mouth daily.  .  oMarland Kitcheneprazole (PRILOSEC) 40 MG capsule,  Take 1 capsule (40 mg total) by mouth daily. .  ONE TOUCH LANCETS MISC, 1 each by Does not apply route 3 (three) times daily. Glory [place] VERIO test strip, USE TO TEST 3 TIMES DAILY .  ONETOUCH VERIO test strip, USE TO TEST 3 TIMES DAILY .  traZODone (DESYREL) 100 MG tablet, Take 1 tablet (100 mg total) by mouth at bedtime. [name]  doxycycline (VIBRAMYCIN) 100 MG capsule, Take 1 capsule  (100 mg total) by mouth 2 (two) times daily. For 5 days *For reference purposes while preparing patient instructions.   Delete this med list prior to printing instructions for patient.*           On the morning of your procedure, take your Aspirin/Plavix  and any morning medicines NOT listed above.  You may use sips of water.  5. Plan for one night stay--bring personal belongings. 6. Bring a current list of your medications and current insurance cards. 7. You MUST have a responsible person to drive you home. 8. Someone MUST be with you the first 24 hours after you arrive home or your discharge will be delayed. 9. Please wear clothes that are easy to get on and off and wear slip-on shoes.  Thank you for allowing [place] to care for you!   -- [facility]

## 2017-12-16 NOTE — Progress Notes (Signed)
Cardiology Office Note   Date:  12/17/2017   ID:  Joshua [name], DOB 1955/10/25, MRN [id number]  PCP:  Jearld Fenton, NP  Cardiologist:  Dr. Marlou Porch  No chief complaint on file.     History of Present Illness: Joshua [name] is a 62 y.o. male who is here today for a follow up visit regarding peripheral arterial disease. He has known history of coronary artery disease status post rotational atherectomy and drug-eluting stent placement to the mid LAD. He has known history of peripheral arterial disease status post aortobifemoral bypass in 2010 at [facility], diabetes mellitus with diabetic neuropathy, hyperlipidemia and tobacco use. He was seen in 2017 for foot burning sensation and throbbing with chronic bluish discoloration of his feet for many years.  Angiography in 01/2016 showed patent aortobifemoral bypass with moderate stenosis affecting the left common femoral artery and ostial left SFA. There was borderline significant diffuse disease affecting the right SFA. These findings did not explain his symptoms.  The velocity at the left common femoral artery was moderately elevated by duplex.  The patient reestablished recently after stopping all his medications due to cost.  I resumed his medications.  He complained of worsening claudication.  I repeated vascular studies which showed an ABI of 0.75 on the right and 0.98 on the left.  Aortoiliac duplex showed patent aortofemoral bypass with 50 to 74% stenosis distal to the anastomosis bilaterally. He was noted to have left carotid bruit.  Carotid Doppler showed less than 40% disease bilaterally.  We did routine labs which showed significantly abnormal liver enzymes.  His renal function was also worse with a creatinine of 2.  Thus, we stopped losartan and added amlodipine instead.  Atorvastatin was decreased to 40 mg daily.  He also was drinking heavily and we advised him to cut down.  He quit drinking last month.  He is now dealing with  an upper respiratory tract infection with significant coughing and fever.  He complains of significant worsening of claudication especially on the right side with dark discoloration especially in the morning.  No chest pain or shortness of breath.  Past Medical History:  Diagnosis Date  . AC (acromioclavicular) joint bone spurs, unspecified laterality 11/25/2016  . Arthritis    "back" (05/16/2015)  . CAD in native artery, occluded Lcx and rotational atherectomy to LAD with DES 05/17/15 05/18/2015  . Chronic back pain    "all my back"  . Chronic bronchitis ([place])   . COPD (chronic obstructive pulmonary disease) ([place])   . GERD (gastroesophageal reflux disease)   . H/O blood clots    "had them in my back; put filter in before one of my neck ORs"  . History of gout   . Hyperlipidemia   . Hypertension   . Phlebitis   . Positive TB test   . Seasonal allergies   . Shortness of breath   . Spinal disease     Past Surgical History:  Procedure Laterality Date  . ANTERIOR CERVICAL DECOMP/DISCECTOMY FUSION  ~ 2001-2009 X 3  . ANTERIOR CERVICAL DECOMP/DISCECTOMY FUSION N/A 10/06/2012   Procedure: ANTERIOR CERVICAL DECOMPRESSION/DISCECTOMY FUSION 1 LEVEL;  Surgeon: [name], MD;  Location: [facility];  Service: Neurosurgery;  Laterality: N/A;  C7T1 anterior cervical decompression with fusion plating and bonegraft   . BACK SURGERY    . CARDIAC CATHETERIZATION  05/16/2015  . CARDIAC CATHETERIZATION N/A 05/16/2015   Procedure: Left Heart Cath and Coronary Angiography;  Surgeon: Shanon Brow  Loren Racer, MD;  Location: [facility];  Service: Cardiovascular;  Laterality: N/A;  . CARDIAC CATHETERIZATION N/A 05/16/2015   Procedure: Coronary Balloon Angioplasty;  Surgeon: [name], MD;  Location: [facility];  Service: Cardiovascular;  Laterality: N/A;  . CARDIAC CATHETERIZATION N/A 05/17/2015   Procedure: Coronary Stent Intervention Rotoblater;  Surgeon: [name], MD;  Location: [facility];  Service: Cardiovascular;  Laterality: N/A;  . ELBOW SURGERY Left 2016   "had bone pieces removed"  . ESOPHAGEAL MANOMETRY N/A 12/30/2016   Procedure: ESOPHAGEAL MANOMETRY (EM);  Surgeon: Irene Shipper, MD;  Location: [facility];  Service: Endoscopy;  Laterality: N/A;  . EXCISIONAL HEMORRHOIDECTOMY    . FEMORAL BYPASS  ~ 2010  . IR RADIOLOGIST EVAL & MGMT  12/17/2016  . IVC FILTER PLACEMENT ([facility] HX)  2010   "had blood clots in my back"  . PERIPHERAL VASCULAR CATHETERIZATION N/A 02/14/2016   Procedure: Abdominal Aortogram w/Lower Extremity;  Surgeon: Wellington Hampshire, MD;  Location: [facility];  Service: Cardiovascular;  Laterality: N/A;  . POSTERIOR LAMINECTOMY / DECOMPRESSION LUMBAR SPINE  2006   "bone spurs"     Current Outpatient Medications  Medication Sig Dispense Refill  . amLODipine (NORVASC) 5 MG tablet Take 1 tablet (5 mg total) by mouth daily. 90 tablet 1  . aspirin 81 MG chewable tablet Chew 1 tablet (81 mg total) by mouth daily.    [name] atorvastatin (LIPITOR) 40 MG tablet Take 1 tablet (40 mg total) by mouth daily at 6 PM. 90 tablet 1  . Blood Glucose Monitoring Suppl (ONETOUCH VERIO) w/Device KIT 1 Device by Does not apply route once. 1 kit 0  . clopidogrel (PLAVIX) 75 MG tablet Take 1 tablet (75 mg total) by mouth daily. Please make yearly appt with Dr. Fletcher [name] for January before anymore refills. 1st attempt 30 tablet 0  . fluticasone (FLOVENT HFA) 44 MCG/ACT inhaler Inhale 1 puff into the lungs 2 (two) times daily as needed (shortness of breath).    . nitroGLYCERIN (NITROSTAT) 0.4 MG SL tablet Place 1 tablet (0.4 mg total) under the tongue every 5 (five) minutes as needed for chest pain. 25 tablet 4  . Omega-3 Fatty Acids (FISH OIL) 1200 MG CAPS Take 1,200 mg by mouth daily.     [name] omeprazole (PRILOSEC) 40 MG capsule Take 1 capsule (40 mg total) by mouth daily. 90 capsule 3  . ONE TOUCH LANCETS MISC 1 each by Does not apply route 3 (three) times daily.  200 each 5  . ONETOUCH VERIO test strip USE TO TEST 3 TIMES DAILY 100 each 5  . ONETOUCH VERIO test strip USE TO TEST 3 TIMES DAILY 300 each 0  . PROAIR HFA 108 (90 Base) MCG/ACT inhaler USE 2 PUFFS EVERY 6 HOURS  AS NEEDED FOR WHEEZING OR  SHORTNESS OF BREATH 34 g 3  . traZODone (DESYREL) 100 MG tablet Take 1 tablet (100 mg total) by mouth at bedtime. 90 tablet 1  . diphenhydrAMINE (BENADRYL) 50 MG tablet Take 0.5 tablets (25 mg total) by mouth once. 1 hr prior to CT scan 30 tablet 0  . doxycycline (VIBRAMYCIN) 100 MG capsule Take 1 capsule (100 mg total) by mouth 2 (two) times daily. For 5 days 10 capsule 0  . predniSONE (DELTASONE) 50 MG tablet Take 1 tablet 13 hours prior to procedure, Take 1 tablet 7 hours prior to procedure, Take 1 tablet 1 hour prior to procedure 3  tablet 0   No current facility-administered medications for this visit.     Allergies:   Lisinopril; Amitriptyline; Contrast media [iodinated diagnostic agents]; and Ibuprofen    Social History:  The patient  reports that he has been smoking cigarettes. He has a 23.50 pack-year smoking history. He has never used smokeless tobacco. He reports that he drinks alcohol. He reports that he does not use drugs.   Family History:  The patient's family history includes Aneurysm in his brother; Healthy in his daughter and maternal grandmother; Heart attack in his father; Hypertension in his father and mother; Leukemia in his grandchild; Stomach cancer in his mother; Stroke in his father.    ROS:  Please see the history of present illness.   Otherwise, review of systems are positive for none.   All other systems are reviewed and negative.    PHYSICAL EXAM: VS:  BP (!) 145/99 (BP Location: Right Arm, Patient Position: Sitting, Cuff Size: Normal)   Pulse 100   Ht '5\' 9"'  (1.753 m)   Wt 196 lb 11.2 oz (89.2 kg)   BMI 29.05 kg/m  , BMI Body mass index is 29.05 kg/m. GEN: Well nourished, well developed, in no acute distress  HEENT:  normal  Neck: no JVD, carotid bruits, or masses Cardiac: RRR; no murmurs, rubs, or gallops,no edema  Respiratory: Bilateral rhonchi and mild expiratory wheezing. GI: soft, nontender, nondistended, + BS MS: no deformity or atrophy  Skin: warm and dry, no rash Neuro:  Strength and sensation are intact Psych: euthymic mood, full affect Vascular: Femoral pulses are normal bilaterally. Distal pulses are not palpable with minimal edema and stasis dermatitis. Left radial and brachial pulses normal.  EKG:  EKG is not ordered today.  Recent Labs: 12/12/2017: ALT 103; BUN 9; Creatinine, Ser 0.85; Potassium 4.6; Sodium 145    Lipid Panel    Component Value Date/Time   CHOL 215 (H) 11/21/2017 0810   TRIG 94 11/21/2017 0810   HDL 126 11/21/2017 0810   CHOLHDL 1.7 11/21/2017 0810   CHOLHDL 3 10/14/2016 1440   VLDL 25.0 10/14/2016 1440   LDLCALC 70 11/21/2017 0810   LDLDIRECT 107.0 04/21/2015 1352      Wt Readings from Last 3 Encounters:  12/16/17 196 lb 11.2 oz (89.2 kg)  11/11/17 197 lb (89.4 kg)  03/13/17 206 lb (93.4 kg)         ASSESSMENT AND PLAN:  1.   Peripheral arterial disease: Status post aortobifemoral bypass with known moderate disease affecting the left common femoral and ostial left SFA and borderline diffuse disease affecting the right SFA.  Recent worsening of severe claudication especially on the right side with significant drop in ABI.  There is also dark discoloration affecting the toes.  Due to that, I recommend proceeding with abdominal aortogram with lower extremity runoff and possible endovascular intervention. Planned access is via the left brachial artery given previous history of aortobifemoral bypass.  2. Coronary artery disease: Status post atherectomy and drug-eluting stent placement to the LAD.  The patient has no angina.  Continue dual antiplatelet therapy.  3. Tobacco use: He is down to a quarter pack per day and is trying to quit. I again discussed  with him the importance of smoking cessation  4. Hyperlipidemia: The dose of atorvastatin was decreased recently due to abnormal liver enzymes.  5.  Excessive alcohol use: The patient quit drinking last month.  His renal function returned to normal and his liver enzymes improved.  6.  Essential hypertension: Blood pressure is mildly elevated but the patient is dealing with what seems to be bronchitis.  I made no changes in his medications today.  7.  Acute bronchitis: I prescribed doxycycline for 5 days.   Disposition:   FU with me in 1 months  Signed,  Kathlyn Sacramento, MD  12/17/2017 6:12 PM    [place]

## 2017-12-16 NOTE — H&P (View-Only) (Signed)
Cardiology Office Note   Date:  12/17/2017   ID:  Joshua [name], DOB 1956-02-22, MRN [id number]  PCP:  Jearld Fenton, NP  Cardiologist:  Dr. Marlou Porch  No chief complaint on file.     History of Present Illness: Joshua [name] is a 61 y.o. male who is here today for a follow up visit regarding peripheral arterial disease. He has known history of coronary artery disease status post rotational atherectomy and drug-eluting stent placement to the mid LAD. He has known history of peripheral arterial disease status post aortobifemoral bypass in 2010 at [place], diabetes mellitus with diabetic neuropathy, hyperlipidemia and tobacco use. He was seen in 2017 for foot burning sensation and throbbing with chronic bluish discoloration of his feet for many years.  Angiography in 01/2016 showed patent aortobifemoral bypass with moderate stenosis affecting the left common femoral artery and ostial left SFA. There was borderline significant diffuse disease affecting the right SFA. These findings did not explain his symptoms.  The velocity at the left common femoral artery was moderately elevated by duplex.  The patient reestablished recently after stopping all his medications due to cost.  I resumed his medications.  He complained of worsening claudication.  I repeated vascular studies which showed an ABI of 0.75 on the right and 0.98 on the left.  Aortoiliac duplex showed patent aortofemoral bypass with 50 to 74% stenosis distal to the anastomosis bilaterally. He was noted to have left carotid bruit.  Carotid Doppler showed less than 40% disease bilaterally.  We did routine labs which showed significantly abnormal liver enzymes.  His renal function was also worse with a creatinine of 2.  Thus, we stopped losartan and added amlodipine instead.  Atorvastatin was decreased to 40 mg daily.  He also was drinking heavily and we advised him to cut down.  He quit drinking last month.  He is now dealing with  an upper respiratory tract infection with significant coughing and fever.  He complains of significant worsening of claudication especially on the right side with dark discoloration especially in the morning.  No chest pain or shortness of breath.  Past Medical History:  Diagnosis Date  . AC (acromioclavicular) joint bone spurs, unspecified laterality 11/25/2016  . Arthritis    "back" (05/16/2015)  . CAD in native artery, occluded Lcx and rotational atherectomy to LAD with DES 05/17/15 05/18/2015  . Chronic back pain    "all my back"  . Chronic bronchitis ([place])   . COPD (chronic obstructive pulmonary disease) ([place])   . GERD (gastroesophageal reflux disease)   . H/O blood clots    "had them in my back; put filter in before one of my neck ORs"  . History of gout   . Hyperlipidemia   . Hypertension   . Phlebitis   . Positive TB test   . Seasonal allergies   . Shortness of breath   . Spinal disease     Past Surgical History:  Procedure Laterality Date  . ANTERIOR CERVICAL DECOMP/DISCECTOMY FUSION  ~ 2001-2009 X 3  . ANTERIOR CERVICAL DECOMP/DISCECTOMY FUSION N/A 10/06/2012   Procedure: ANTERIOR CERVICAL DECOMPRESSION/DISCECTOMY FUSION 1 LEVEL;  Surgeon: [name], MD;  Location: [facility];  Service: Neurosurgery;  Laterality: N/A;  C7T1 anterior cervical decompression with fusion plating and bonegraft   . BACK SURGERY    . CARDIAC CATHETERIZATION  05/16/2015  . CARDIAC CATHETERIZATION N/A 05/16/2015   Procedure: Left Heart Cath and Coronary Angiography;  Surgeon: Shanon Brow  Loren Racer, MD;  Location: [facility];  Service: Cardiovascular;  Laterality: N/A;  . CARDIAC CATHETERIZATION N/A 05/16/2015   Procedure: Coronary Balloon Angioplasty;  Surgeon: [name], MD;  Location: [facility];  Service: Cardiovascular;  Laterality: N/A;  . CARDIAC CATHETERIZATION N/A 05/17/2015   Procedure: Coronary Stent Intervention Rotoblater;  Surgeon: [name], MD;  Location: [facility];  Service: Cardiovascular;  Laterality: N/A;  . ELBOW SURGERY Left 2016   "had bone pieces removed"  . ESOPHAGEAL MANOMETRY N/A 12/30/2016   Procedure: ESOPHAGEAL MANOMETRY (EM);  Surgeon: Irene Shipper, MD;  Location: [facility];  Service: Endoscopy;  Laterality: N/A;  . EXCISIONAL HEMORRHOIDECTOMY    . FEMORAL BYPASS  ~ 2010  . IR RADIOLOGIST EVAL & MGMT  12/17/2016  . IVC FILTER PLACEMENT ([place] HX)  2010   "had blood clots in my back"  . PERIPHERAL VASCULAR CATHETERIZATION N/A 02/14/2016   Procedure: Abdominal Aortogram w/Lower Extremity;  Surgeon: Wellington Hampshire, MD;  Location: [facility];  Service: Cardiovascular;  Laterality: N/A;  . POSTERIOR LAMINECTOMY / DECOMPRESSION LUMBAR SPINE  2006   "bone spurs"     Current Outpatient Medications  Medication Sig Dispense Refill  . amLODipine (NORVASC) 5 MG tablet Take 1 tablet (5 mg total) by mouth daily. 90 tablet 1  . aspirin 81 MG chewable tablet Chew 1 tablet (81 mg total) by mouth daily.    [name] atorvastatin (LIPITOR) 40 MG tablet Take 1 tablet (40 mg total) by mouth daily at 6 PM. 90 tablet 1  . Blood Glucose Monitoring Suppl (ONETOUCH VERIO) w/Device KIT 1 Device by Does not apply route once. 1 kit 0  . clopidogrel (PLAVIX) 75 MG tablet Take 1 tablet (75 mg total) by mouth daily. Please make yearly appt with Dr. Fletcher [name] for January before anymore refills. 1st attempt 30 tablet 0  . fluticasone (FLOVENT HFA) 44 MCG/ACT inhaler Inhale 1 puff into the lungs 2 (two) times daily as needed (shortness of breath).    . nitroGLYCERIN (NITROSTAT) 0.4 MG SL tablet Place 1 tablet (0.4 mg total) under the tongue every 5 (five) minutes as needed for chest pain. 25 tablet 4  . Omega-3 Fatty Acids (FISH OIL) 1200 MG CAPS Take 1,200 mg by mouth daily.     [name] omeprazole (PRILOSEC) 40 MG capsule Take 1 capsule (40 mg total) by mouth daily. 90 capsule 3  . ONE TOUCH LANCETS MISC 1 each by Does not apply route 3 (three) times daily.  200 each 5  . ONETOUCH VERIO test strip USE TO TEST 3 TIMES DAILY 100 each 5  . ONETOUCH VERIO test strip USE TO TEST 3 TIMES DAILY 300 each 0  . PROAIR HFA 108 (90 Base) MCG/ACT inhaler USE 2 PUFFS EVERY 6 HOURS  AS NEEDED FOR WHEEZING OR  SHORTNESS OF BREATH 34 g 3  . traZODone (DESYREL) 100 MG tablet Take 1 tablet (100 mg total) by mouth at bedtime. 90 tablet 1  . diphenhydrAMINE (BENADRYL) 50 MG tablet Take 0.5 tablets (25 mg total) by mouth once. 1 hr prior to CT scan 30 tablet 0  . doxycycline (VIBRAMYCIN) 100 MG capsule Take 1 capsule (100 mg total) by mouth 2 (two) times daily. For 5 days 10 capsule 0  . predniSONE (DELTASONE) 50 MG tablet Take 1 tablet 13 hours prior to procedure, Take 1 tablet 7 hours prior to procedure, Take 1 tablet 1 hour prior to procedure 3  tablet 0   No current facility-administered medications for this visit.     Allergies:   Lisinopril; Amitriptyline; Contrast media [iodinated diagnostic agents]; and Ibuprofen    Social History:  The patient  reports that he has been smoking cigarettes. He has a 23.50 pack-year smoking history. He has never used smokeless tobacco. He reports that he drinks alcohol. He reports that he does not use drugs.   Family History:  The patient's family history includes Aneurysm in his brother; Healthy in his daughter and maternal grandmother; Heart attack in his father; Hypertension in his father and mother; Leukemia in his grandchild; Stomach cancer in his mother; Stroke in his father.    ROS:  Please see the history of present illness.   Otherwise, review of systems are positive for none.   All other systems are reviewed and negative.    PHYSICAL EXAM: VS:  BP (!) 145/99 (BP Location: Right Arm, Patient Position: Sitting, Cuff Size: Normal)   Pulse 100   Ht '5\' 9"'  (1.753 m)   Wt 196 lb 11.2 oz (89.2 kg)   BMI 29.05 kg/m  , BMI Body mass index is 29.05 kg/m. GEN: Well nourished, well developed, in no acute distress  HEENT:  normal  Neck: no JVD, carotid bruits, or masses Cardiac: RRR; no murmurs, rubs, or gallops,no edema  Respiratory: Bilateral rhonchi and mild expiratory wheezing. GI: soft, nontender, nondistended, + BS MS: no deformity or atrophy  Skin: warm and dry, no rash Neuro:  Strength and sensation are intact Psych: euthymic mood, full affect Vascular: Femoral pulses are normal bilaterally. Distal pulses are not palpable with minimal edema and stasis dermatitis. Left radial and brachial pulses normal.  EKG:  EKG is not ordered today.  Recent Labs: 12/12/2017: ALT 103; BUN 9; Creatinine, Ser 0.85; Potassium 4.6; Sodium 145    Lipid Panel    Component Value Date/Time   CHOL 215 (H) 11/21/2017 0810   TRIG 94 11/21/2017 0810   HDL 126 11/21/2017 0810   CHOLHDL 1.7 11/21/2017 0810   CHOLHDL 3 10/14/2016 1440   VLDL 25.0 10/14/2016 1440   LDLCALC 70 11/21/2017 0810   LDLDIRECT 107.0 04/21/2015 1352      Wt Readings from Last 3 Encounters:  12/16/17 196 lb 11.2 oz (89.2 kg)  11/11/17 197 lb (89.4 kg)  03/13/17 206 lb (93.4 kg)         ASSESSMENT AND PLAN:  1.   Peripheral arterial disease: Status post aortobifemoral bypass with known moderate disease affecting the left common femoral and ostial left SFA and borderline diffuse disease affecting the right SFA.  Recent worsening of severe claudication especially on the right side with significant drop in ABI.  There is also dark discoloration affecting the toes.  Due to that, I recommend proceeding with abdominal aortogram with lower extremity runoff and possible endovascular intervention. Planned access is via the left brachial artery given previous history of aortobifemoral bypass.  2. Coronary artery disease: Status post atherectomy and drug-eluting stent placement to the LAD.  The patient has no angina.  Continue dual antiplatelet therapy.  3. Tobacco use: He is down to a quarter pack per day and is trying to quit. I again discussed  with him the importance of smoking cessation  4. Hyperlipidemia: The dose of atorvastatin was decreased recently due to abnormal liver enzymes.  5.  Excessive alcohol use: The patient quit drinking last month.  His renal function returned to normal and his liver enzymes improved.  6.  Essential hypertension: Blood pressure is mildly elevated but the patient is dealing with what seems to be bronchitis.  I made no changes in his medications today.  7.  Acute bronchitis: I prescribed doxycycline for 5 days.   Disposition:   FU with me in 1 months  Signed,  Kathlyn Sacramento, MD  12/17/2017 6:12 PM    [place]

## 2017-12-18 ENCOUNTER — Telehealth: Payer: Self-pay | Admitting: *Deleted

## 2017-12-18 ENCOUNTER — Encounter: Payer: Self-pay | Admitting: Internal Medicine

## 2017-12-18 ENCOUNTER — Ambulatory Visit (INDEPENDENT_AMBULATORY_CARE_PROVIDER_SITE_OTHER): Payer: Medicare Other | Admitting: Internal Medicine

## 2017-12-18 VITALS — BP 140/82 | HR 92 | Temp 99.0°F | Wt 194.0 lb

## 2017-12-18 DIAGNOSIS — J441 Chronic obstructive pulmonary disease with (acute) exacerbation: Secondary | ICD-10-CM | POA: Diagnosis not present

## 2017-12-18 DIAGNOSIS — Z01818 Encounter for other preprocedural examination: Secondary | ICD-10-CM

## 2017-12-18 DIAGNOSIS — I739 Peripheral vascular disease, unspecified: Secondary | ICD-10-CM

## 2017-12-18 MED ORDER — PREDNISONE 10 MG PO TABS: ORAL_TABLET | ORAL | 0 refills | Status: DC

## 2017-12-18 MED ORDER — FEXOFENADINE HCL 180 MG PO TABS: 180.0000 mg | ORAL_TABLET | Freq: Every day | ORAL | 3 refills | Status: DC

## 2017-12-18 MED ORDER — HYDROCODONE-HOMATROPINE 5-1.5 MG/5ML PO SYRP: 5.0000 mL | ORAL_SOLUTION | Freq: Three times a day (TID) | ORAL | 0 refills | Status: DC | PRN

## 2017-12-18 MED ORDER — IPRATROPIUM-ALBUTEROL 0.5-2.5 (3) MG/3ML IN SOLN
3.0000 mL | Freq: Once | RESPIRATORY_TRACT | Status: AC
  Administered 2017-12-18: 3 mL via RESPIRATORY_TRACT

## 2017-12-18 NOTE — Patient Instructions (Signed)

## 2017-12-18 NOTE — Progress Notes (Signed)
HPI  Pt presents to the clinic today with c/o runny nose, itchy ears and cough. He reports this started 1 week ago. He denies ear pain, ringing, decreased hearing or drainage. He is blowing clear mucous out of his nose. The cough is productive of yellow mucous. He denies runny nose, ear pain, sore throat. He is mildly short of breath. He is running low grade fevers, but denies chills or body aches. He was started on Doxycycline by cardiology. He is taking meds as prescribed but reports he does not feel any better. He has been taking Robitussin as well with minimal relief. He has a history of COPD. He has not had sick contacts that he is aware of.   Review of Systems      Past Medical History:  Diagnosis Date  . AC (acromioclavicular) joint bone spurs, unspecified laterality 11/25/2016  . Arthritis    "back" (05/16/2015)  . CAD in native artery, occluded Lcx and rotational atherectomy to LAD with DES 05/17/15 05/18/2015  . Chronic back pain    "all my back"  . Chronic bronchitis ([place])   . COPD (chronic obstructive pulmonary disease) ([place])   . GERD (gastroesophageal reflux disease)   . H/O blood clots    "had them in my back; put filter in before one of my neck ORs"  . History of gout   . Hyperlipidemia   . Hypertension   . Phlebitis   . Positive TB test   . Seasonal allergies   . Shortness of breath   . Spinal disease     Family History  Problem Relation Age of Onset  . Stomach cancer Mother        Deceased, 65s  . Hypertension Mother   . Hypertension Father   . Stroke Father   . Heart attack Father        Deceased, 47  . Aneurysm Brother   . Healthy Daughter   . Healthy Maternal Grandmother   . Leukemia Grandchild   . Diabetes Neg Hx   . Early death Neg Hx   . Colon cancer Neg Hx   . Pancreatic cancer Neg Hx     Social History   Socioeconomic History  . Marital status: Married    Spouse name: Not on file  . Number of children: Not on file  . Years of education: 7   . Highest education level: Not on file  Occupational History  . Occupation: Disabled    Employer: DISABLED  Social Needs  . Financial resource strain: Not on file  . Food insecurity:    Worry: Not on file    Inability: Not on file  . Transportation needs:    Medical: Not on file    Non-medical: Not on file  Tobacco Use  . Smoking status: Current Every Day Smoker    Packs/day: 0.50    Years: 47.00    Pack years: 23.50    Types: Cigarettes  . Smokeless tobacco: Never Used  Substance and Sexual Activity  . 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 Drug use: No  . Sexual activity: Not Currently  Lifestyle  . Physical activity:    Days per week: Not on file    Minutes per session: Not on file  . Stress: Not on file  Relationships  . Social connections:    Talks  on phone: Not on file    Gets together: Not on file    Attends religious service: Not on file    Active member of club or organization: Not on file    Attends meetings of clubs or organizations: Not on file    Relationship status: Not on file  . Intimate partner violence:    Fear of current or ex partner: Not on file    Emotionally abused: Not on file    Physically abused: Not on file    Forced sexual activity: Not on file  Other Topics Concern  . Not on file  Social History Narrative   Regular exercise-no   Caffeine Use-yes   Lives with wife in a one story home. Has 2 children.  On disability for low back pain.  Used to work as a Games developer, lasted worked in 2007.     Education: 7th grade.    Allergies  Allergen Reactions  . Lisinopril Hives and Itching  . Amitriptyline Other (See Comments)    Makes pt feel "weird" and nausea.  . Contrast Media [Iodinated Diagnostic Agents] Hives and Itching    Pt can use if taking benadryl   . Ibuprofen Other (See Comments)    Internal bleeding      Constitutional: Positive fever. Denies headache, fatigue, or abrupt weight changes.  HEENT:  Positive itchy ears, runny nose. Denies eye redness, eye pain, pressure behind the eyes, facial pain, nasal congestion, ear pain, ringing in the ears, wax buildup, or sore throat. Respiratory: Positive cough, shortness of breath. Denies difficulty breathing Cardiovascular: Denies chest pain, chest tightness, palpitations or swelling in the hands or feet.   No other specific complaints in a complete review of systems (except as listed in HPI above).  Objective:   BP 140/82   Pulse 92   Temp 99 F (37.2 C) (Oral)   Wt 194 lb (88 kg)   SpO2 96%   BMI 28.65 kg/m  Wt Readings from Last 3 Encounters:  12/18/17 194 lb (88 kg)  12/16/17 196 lb 11.2 oz (89.2 kg)  11/11/17 197 lb (89.4 kg)     General: Appears his stated age, ill appearing, in NAD. HEENT: Head: normal shape and size, no sinus tenderness noted; Ears: Tm's gray and intact, normal light reflex; Nose: mucosa boggy and moist, turbinates swollen; Throat/Mouth: + PND. Teeth present, mucosa erythematous and moist, no exudate noted, no lesions or ulcerations noted.  Neck: No cervical lymphadenopathy.  Cardiovascular: Normal rate and rhythm. S1,S2 noted.  No murmur, rubs or gallops noted.  Pulmonary/Chest: Increased effort with bilateral expiratory wheezing noted. No respiratory distress. No rales or ronchi noted.       Assessment & Plan:   COPD Exacerbation:  Get some rest and drink plenty of water Duoneb given in office Continue Doxycycline as prescribed eRx for Pred Taper x 6 days Continue inhalers as prescribed eRx for Hycodan cough syrup  RTC as needed or if symptoms persist.   Webb Silversmith, NP

## 2017-12-18 NOTE — Telephone Encounter (Signed)
Patient called and made aware that he will need to have lab work prior to his angiogram on 01/07/18. Orders have been placed.

## 2017-12-19 ENCOUNTER — Telehealth: Payer: Self-pay | Admitting: Internal Medicine

## 2017-12-19 MED ORDER — FEXOFENADINE HCL 180 MG PO TABS: 180.0000 mg | ORAL_TABLET | Freq: Every day | ORAL | 3 refills | Status: DC

## 2017-12-19 NOTE — Telephone Encounter (Signed)
Allegra sent to [facility]

## 2017-12-19 NOTE — Telephone Encounter (Signed)
Copied from [place] [id number]. Topic: General - Other >> Dec 19, 2017  9:19 AM Keene Breath wrote: Reason for CRM: Patient called to request that his sinus medication that was sent to [place], [place], be sent to the [place], [place] - [place] [phone] (Phone) [phone] (Fax) because it is much less expensive for the patient.  Patient does not remember the name of the medication, but he states whatever was sent to [facility] should be sent to [facility].  Please advise.  CB# [phone]

## 2017-12-22 ENCOUNTER — Telehealth: Payer: Self-pay

## 2017-12-22 DIAGNOSIS — J449 Chronic obstructive pulmonary disease, unspecified: Secondary | ICD-10-CM

## 2017-12-22 NOTE — Telephone Encounter (Signed)
Copied from [place] [id number]. Topic: Inquiry >> Dec 22, 2017  8:30 AM Joshua [name] wrote: Reason for CRM: pt called [name]/c the breathing treatment he received from pcp worked really well; pt is asking to get some assistance for him to have a machine at home that he can do these treatments on his own; contact to advise

## 2017-12-22 NOTE — Telephone Encounter (Signed)
Pt last seen 12/18/17.Please advise.

## 2017-12-22 NOTE — Telephone Encounter (Signed)
He will need PFT's as there are none on file. Does he see a pulmonologist? I believe he told me he was not taking Flovent, only Albuterol.

## 2017-12-23 NOTE — Telephone Encounter (Signed)
Referral placed.

## 2017-12-23 NOTE — Addendum Note (Signed)
Addended by: Jearld Fenton on: 12/23/2017 04:06 PM   Modules accepted: Orders

## 2017-12-23 NOTE — Telephone Encounter (Signed)
Pt reports he has not seen Pulm in almost 15 years... He is okay with referral to see Pulm.Marland KitchenMarland Kitchen

## 2017-12-25 ENCOUNTER — Ambulatory Visit (INDEPENDENT_AMBULATORY_CARE_PROVIDER_SITE_OTHER)
Admission: RE | Admit: 2017-12-25 | Discharge: 2017-12-25 | Disposition: A | Discharge status: Home / Self Care | Payer: Medicare Other | Source: Ambulatory Visit | Attending: Pulmonary Disease | Admitting: Pulmonary Disease

## 2017-12-25 ENCOUNTER — Encounter: Payer: Self-pay | Admitting: Pulmonary Disease

## 2017-12-25 ENCOUNTER — Ambulatory Visit: Payer: Medicare Other | Admitting: Pulmonary Disease

## 2017-12-25 ENCOUNTER — Other Ambulatory Visit: Payer: Medicare Other

## 2017-12-25 VITALS — BP 152/90 | HR 84 | Wt 200.4 lb

## 2017-12-25 DIAGNOSIS — R0609 Other forms of dyspnea: Secondary | ICD-10-CM

## 2017-12-25 DIAGNOSIS — R918 Other nonspecific abnormal finding of lung field: Secondary | ICD-10-CM | POA: Diagnosis not present

## 2017-12-25 DIAGNOSIS — R0602 Shortness of breath: Secondary | ICD-10-CM | POA: Diagnosis not present

## 2017-12-25 DIAGNOSIS — R7611 Nonspecific reaction to tuberculin skin test without active tuberculosis: Secondary | ICD-10-CM

## 2017-12-25 DIAGNOSIS — R911 Solitary pulmonary nodule: Secondary | ICD-10-CM

## 2017-12-25 DIAGNOSIS — R05 Cough: Secondary | ICD-10-CM | POA: Diagnosis not present

## 2017-12-25 DIAGNOSIS — J441 Chronic obstructive pulmonary disease with (acute) exacerbation: Secondary | ICD-10-CM | POA: Diagnosis not present

## 2017-12-25 DIAGNOSIS — Z122 Encounter for screening for malignant neoplasm of respiratory organs: Secondary | ICD-10-CM

## 2017-12-25 MED ORDER — METHYLPREDNISOLONE ACETATE 80 MG/ML IJ SUSP
80.0000 mg | INTRAMUSCULAR | Status: AC
  Administered 2017-12-25: 80 mg via INTRAMUSCULAR

## 2017-12-25 MED ORDER — FLUTICASONE-UMECLIDIN-VILANT 100-62.5-25 MCG/INH IN AEPB: 1.0000 | INHALATION_SPRAY | Freq: Every day | RESPIRATORY_TRACT | 5 refills | Status: DC

## 2017-12-25 MED ORDER — AZITHROMYCIN 250 MG PO TABS: ORAL_TABLET | ORAL | 0 refills | Status: DC

## 2017-12-25 MED ORDER — PREDNISONE 20 MG PO TABS: 20.0000 mg | ORAL_TABLET | Freq: Every day | ORAL | 0 refills | Status: AC

## 2017-12-25 MED ORDER — ALBUTEROL SULFATE (2.5 MG/3ML) 0.083% IN NEBU
2.5000 mg | INHALATION_SOLUTION | RESPIRATORY_TRACT | Status: AC
  Administered 2017-12-25: 2.5 mg via RESPIRATORY_TRACT

## 2017-12-25 NOTE — Progress Notes (Signed)
 Synopsis: Referred in Oct. 2019 for COPD by Baity, Regina W, NP  Subjective:   PATIENT ID: Joshua [name] GENDER: male DOB: 04/13/1955, MRN: [id number]  Chief Complaint  Patient presents with  . Consult    COPD, bronchitis x 3 weeks. States he has been SOB, wheezing, chest feels heavy, cough with mucous production.     PMH of tobacco abuse, smoked since age of 12, at heaviest was 1ppd and now down to 0.5ppd. History of back surgery complicated by DVT. He has planned intervention by cardiology for his PVD on his right leg in two weeks.  He was treated with antibiotics and steroids recently by his primary care provider.  He is recently at the end of his steroid taper.  Presents to the office today with significant dyspnea on exertion, congestion, cough, sputum production as well as chest tightness and wheezing.  He has difficulty getting up and walking from his chair to the bathroom.  He states that he received a breathing treatment by his primary care doctor and felt much better for several hours after that.  He is still continuing to smoke and he knows that he needs to quit but he has been unable to stop.  He has been incarcerated x2 each time for a little less than a year.  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  He denies IV drug use history.  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    Past Medical History:  Diagnosis Date  . AC (acromioclavicular) joint bone spurs, unspecified laterality 11/25/2016  . Arthritis    "back" (05/16/2015)  . CAD in native artery, occluded Lcx and rotational atherectomy to LAD with DES 05/17/15 05/18/2015  . Chronic back pain    "all my back"  . Chronic bronchitis (HCC)   . COPD (chronic obstructive pulmonary disease) (HCC)   . GERD (gastroesophageal reflux disease)   . H/O blood clots    "had them in my back; put filter in before one of my neck ORs"  . History of gout   . Hyperlipidemia   . Hypertension   .  Phlebitis   . Positive TB test   . Seasonal allergies   . Shortness of breath   . Spinal disease      Family History  Problem Relation Age of Onset  . Stomach cancer Mother        Deceased, 70s  . Hypertension Mother   . Hypertension Father   . Stroke Father   . Heart attack Father        Deceased, 71  . Aneurysm Brother   . Healthy Daughter   . Healthy Maternal Grandmother   . Leukemia Grandchild   . Diabetes Neg Hx   . Early death Neg Hx   . Colon cancer Neg Hx   . Pancreatic cancer Neg Hx      Past Surgical History:  Procedure Laterality Date  . ANTERIOR CERVICAL DECOMP/DISCECTOMY FUSION  ~ 2001-2009 X 3  . ANTERIOR CERVICAL DECOMP/DISCECTOMY FUSION N/A 10/06/2012   Procedure: ANTERIOR CERVICAL DECOMPRESSION/DISCECTOMY FUSION 1 LEVEL;  Surgeon: Randy O Kritzer, MD;  Location: [facility];  Service: Neurosurgery;  Laterality: N/A;  C7T1 anterior cervical decompression with fusion plating and bonegraft   . BACK SURGERY    . CARDIAC CATHETERIZATION  05/16/2015  . CARDIAC CATHETERIZATION N/A 05/16/2015   Procedure: Left Heart Cath and Coronary Angiography;  Surgeon: David   W Harding, MD;  Location: [facility];  Service: Cardiovascular;  Laterality: N/A;  . CARDIAC CATHETERIZATION N/A 05/16/2015   Procedure: Coronary Balloon Angioplasty;  Surgeon: David W Harding, MD;  Location: [facility];  Service: Cardiovascular;  Laterality: N/A;  . CARDIAC CATHETERIZATION N/A 05/17/2015   Procedure: Coronary Stent Intervention Rotoblater;  Surgeon: Peter M Jordan, MD;  Location: [facility];  Service: Cardiovascular;  Laterality: N/A;  . ELBOW SURGERY Left 2016   "had bone pieces removed"  . ESOPHAGEAL MANOMETRY N/A 12/30/2016   Procedure: ESOPHAGEAL MANOMETRY (EM);  Surgeon: Perry, John N, MD;  Location: [facility];  Service: Endoscopy;  Laterality: N/A;  . EXCISIONAL HEMORRHOIDECTOMY    . FEMORAL BYPASS  ~ 2010  . IR RADIOLOGIST EVAL & MGMT  12/17/2016  . IVC  FILTER PLACEMENT ([facility] HX)  2010   "had blood clots in my back"  . PERIPHERAL VASCULAR CATHETERIZATION N/A 02/14/2016   Procedure: Abdominal Aortogram w/Lower Extremity;  Surgeon: Muhammad A Arida, MD;  Location: [facility];  Service: Cardiovascular;  Laterality: N/A;  . POSTERIOR LAMINECTOMY / DECOMPRESSION LUMBAR SPINE  2006   "bone spurs"    Social History   Socioeconomic History  . Marital status: Married    Spouse name: Not on file  . Number of children: Not on file  . Years of education: 7  . Highest education level: Not on file  Occupational History  . Occupation: Disabled    Employer: DISABLED  Social Needs  . Financial resource strain: Not on file  . Food insecurity:    Worry: Not on file    Inability: Not on file  . Transportation needs:    Medical: Not on file    Non-medical: Not on file  Tobacco Use  . Smoking status: Current Every Day Smoker    Packs/day: 0.50    Years: 47.00    Pack years: 23.50    Types: Cigarettes  . Smokeless tobacco: Never Used  . Tobacco comment: half a pack a day 12/25/2017  Substance and Sexual Activity  . 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 Drug use: No  . Sexual activity: Not Currently  Lifestyle  . Physical activity:    Days per week: Not on file    Minutes per session: Not on file  . Stress: Not on file  Relationships  . Social connections:    Talks on phone: Not on file    Gets together: Not on file    Attends religious service: Not on file    Active member of club or organization: Not on file    Attends meetings of clubs or organizations: Not on file    Relationship status: Not on file  . Intimate partner violence:    Fear of current or ex partner: Not on file    Emotionally abused: Not on file    Physically abused: Not on file    Forced sexual activity: Not on file  Other Topics  Concern  . Not on file  Social History Narrative   Regular exercise-no   Caffeine Use-yes   Lives with wife in a one story home. Has 2 children.  On disability for low back pain.  Used to work as a carpenter, lasted worked in 2007.     Education: 7th grade.       Allergies  Allergen Reactions  . Lisinopril Hives and Itching  . Amitriptyline Other (See Comments)    Makes pt feel "weird" and nausea.  . Contrast Media [Iodinated Diagnostic Agents] Hives and Itching    Pt can use if taking benadryl   . Ibuprofen Other (See Comments)    Internal bleeding     Outpatient Medications Prior to Visit  Medication Sig Dispense Refill  . amLODipine (NORVASC) 5 MG tablet Take 1 tablet (5 mg total) by mouth daily. 90 tablet 1  . aspirin 81 MG chewable tablet Chew 1 tablet (81 mg total) by mouth daily.    [name] atorvastatin (LIPITOR) 40 MG tablet Take 1 tablet (40 mg total) by mouth daily at 6 PM. 90 tablet 1  . Blood Glucose Monitoring Suppl (ONETOUCH VERIO) w/Device KIT 1 Device by Does not apply route once. 1 kit 0  . clopidogrel (PLAVIX) 75 MG tablet Take 1 tablet (75 mg total) by mouth daily. Please make yearly appt with Dr. Fletcher [name] for January before anymore refills. 1st attempt 30 tablet 0  . fexofenadine (ALLEGRA ALLERGY) 180 MG tablet Take 1 tablet (180 mg total) by mouth daily. 90 tablet 3  . fluticasone (FLOVENT HFA) 44 MCG/ACT inhaler Inhale 1 puff into the lungs 2 (two) times daily as needed (shortness of breath).    . nitroGLYCERIN (NITROSTAT) 0.4 MG SL tablet Place 1 tablet (0.4 mg total) under the tongue every 5 (five) minutes as needed for chest pain. 25 tablet 4  . Omega-3 Fatty Acids (FISH OIL) 1200 MG CAPS Take 1,200 mg by mouth daily.     [name] omeprazole (PRILOSEC) 40 MG capsule Take 1 capsule (40 mg total) by mouth daily. 90 capsule 3  . ONE TOUCH LANCETS MISC 1 each by Does not apply route 3 (three) times daily. 200 each 5  . ONETOUCH VERIO test strip USE TO TEST 3 TIMES DAILY 100 each 5   . ONETOUCH VERIO test strip USE TO TEST 3 TIMES DAILY 300 each 0  . PROAIR HFA 108 (90 Base) MCG/ACT inhaler USE 2 PUFFS EVERY 6 HOURS  AS NEEDED FOR WHEEZING OR  SHORTNESS OF BREATH 34 g 3  . traZODone (DESYREL) 100 MG tablet Take 1 tablet (100 mg total) by mouth at bedtime. 90 tablet 1  . diphenhydrAMINE (BENADRYL) 50 MG tablet Take 0.5 tablets (25 mg total) by mouth once. 1 hr prior to CT scan 30 tablet 0  . HYDROcodone-homatropine (HYCODAN) 5-1.5 MG/5ML syrup Take 5 mLs by mouth every 8 (eight) hours as needed for cough. (Patient not taking: Reported on 12/25/2017) 120 mL 0  . doxycycline (VIBRAMYCIN) 100 MG capsule Take 1 capsule (100 mg total) by mouth 2 (two) times daily. For 5 days (Patient not taking: Reported on 12/25/2017) 10 capsule 0  . predniSONE (DELTASONE) 10 MG tablet Take 6 tabs day 1, 5 tabs day 2, 4 tabs day 3, 3 tabs day 4, 2 tabs day 5, 1 tab day 6 (Patient not taking: Reported on 12/25/2017) 21 tablet 0   No facility-administered medications prior to visit.     Review of Systems  Constitutional: Positive for malaise/fatigue. Negative for chills, fever and weight loss.  HENT: Negative for hearing loss, sore throat and tinnitus.   Eyes: Negative for blurred vision and double vision.  Respiratory: Positive for cough, hemoptysis, sputum production, shortness of breath and wheezing. Negative for stridor.   Cardiovascular: Positive for claudication. Negative for chest pain, palpitations, orthopnea, leg swelling and PND.  Gastrointestinal: Negative for abdominal pain, constipation, diarrhea, heartburn, nausea and vomiting.  Genitourinary: Negative for dysuria, hematuria and urgency.  Musculoskeletal: Negative for joint pain and myalgias.  Skin: Negative for itching and rash.  Neurological: Negative for dizziness, tingling, weakness and headaches.  Endo/Heme/Allergies: Negative for environmental allergies. Does not bruise/bleed easily.  Psychiatric/Behavioral: Negative for  depression. The patient is not nervous/anxious and does not have insomnia.   All other systems reviewed and are negative.    Objective:  Physical Exam  Constitutional: He is oriented to person, place, and time. He appears well-developed and well-nourished. No distress.  HENT:  Head: Normocephalic and atraumatic.  Mouth/Throat: Oropharynx is clear and moist.  Eyes: Pupils are equal, round, and reactive to light. Conjunctivae are normal. No scleral icterus.  Neck: Neck supple. No JVD present. No tracheal deviation present.  Cardiovascular: Regular rhythm, normal heart sounds and intact distal pulses.  No murmur heard. Tachycardic  Pulmonary/Chest: No accessory muscle usage or stridor. No tachypnea. He is in respiratory distress. He has wheezes. He has no rhonchi. He has rales.  Abdominal: Soft. Bowel sounds are normal. He exhibits no distension. There is no tenderness.  Musculoskeletal: He exhibits edema. He exhibits no tenderness.  Lymphadenopathy:    He has no cervical adenopathy.  Neurological: He is alert and oriented to person, place, and time.  Skin: Skin is warm and dry. Capillary refill takes less than 2 seconds. No rash noted.  Psychiatric: He has a normal mood and affect. His behavior is normal.  Vitals reviewed.    Vitals:   12/25/17 1439  BP: (!) 152/90  Pulse: 84  SpO2: 95%  Weight: 200 lb 6.4 oz (90.9 kg)   95% on RA BMI Readings from Last 3 Encounters:  12/25/17 29.59 kg/m  12/18/17 28.65 kg/m  12/16/17 29.05 kg/m   Wt Readings from Last 3 Encounters:  12/25/17 200 lb 6.4 oz (90.9 kg)  12/18/17 194 lb (88 kg)  12/16/17 196 lb 11.2 oz (89.2 kg)     CBC    Component Value Date/Time   WBC 12.9 (H) 11/25/2016 1430   WBC 12.3 (H) 10/14/2016 1440   RBC 5.94 (H) 11/25/2016 1430   RBC 6.19 (H) 10/14/2016 1440   HGB 19.2 (H) 11/25/2016 1430   HCT 54.8 (H) 11/25/2016 1430   PLT 229 11/25/2016 1430   MCV 92.3 11/25/2016 1430   MCH 32.3 11/25/2016 1430     MCH 29.8 02/13/2016 0949   MCHC 35.0 11/25/2016 1430   MCHC 33.4 10/14/2016 1440   RDW 15.0 (H) 11/25/2016 1430   LYMPHSABS 2.5 11/25/2016 1430   MONOABS 1.3 (H) 11/25/2016 1430   EOSABS 0.3 11/25/2016 1430   BASOSABS 0.0 11/25/2016 1430    Chest Imaging: 2018 CT chest: Right lower lobe 6 mm pulmonary nodule stable from 2014 2018 on prior imaging.  Pulmonary Functions Testing Results: None  FeNO: None  Pathology: None  Echocardiogram: None   Heart Catheterization:   Prox LAD to Mid LAD lesion, 90% stenosed. Post intervention, there remains 80% residual calcific stenosis.  Mid LAD lesion, 80% stenosed. Dist LAD lesion, 55% stenosed.  Prox Cx lesion, 100% stenosed. Prox Cx to Mid Cx lesion, 99% stenosed. Ost 2nd Mrg to 2nd Mrg lesion, 99% stenosed. -- Seen via retrograde filling from LAD/diagonal-OM collaterals  There is hyperdynamic left ventricular systolic function.  Elevated LVEDP    Assessment & Plan:   DOE (dyspnea on exertion) - Plan: Ambulatory Referral for DME, DG Chest 2 View, methylPREDNISolone   acetate (DEPO-MEDROL) injection 80 mg, QuantiFERON-TB Gold Plus, Pulmonary Function Test, Ambulatory Referral for Lung Cancer Scre, albuterol (PROVENTIL) (2.5 MG/3ML) 0.083% nebulizer solution 2.5 mg  SOB (shortness of breath) - Plan: Ambulatory Referral for DME, Pulmonary Function Test  Acute exacerbation of chronic obstructive pulmonary disease (COPD) (HCC)  Abnormal findings on diagnostic imaging of lung - Plan: CANCELED: CT Chest Wo Contrast  Nodule of lower lobe of right lung - Stable from imaging 2014-2018  Encounter for screening for lung cancer  Positive TB test  Discussion:  This is a 62 year old male with a long-standing history of tobacco abuse, cigarette use as well as inhalation cocaine use, 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  He has no prior PFTs but likely has underlying obstructive sleep apnea.  Today presents with an acute exacerbation  of his chronic lung disease with significant dyspnea and shortness of breath and chest tightness, wheezing and sputum production.  We will obtain a two-view chest x-ray give the patient an 80 mg shot of Depo-Medrol in the office today as well as start on prednisone and 5 days of azithromycin. We will give the patient a trilogy inhaler sample as well as refills of albuterol inhaler We will also give him the financial assistance paperwork for [place] to help pay for this inhaler if possible. We will also get him an albuterol nebulizer and machine. 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 Patient will be referred to our lung cancer screening program.  Return to clinic in 4 weeks with PFTs.   Current Outpatient Medications:  .  amLODipine (NORVASC) 5 MG tablet, Take 1 tablet (5 mg total) by mouth daily., Disp: 90 tablet, Rfl: 1 .  aspirin 81 MG chewable tablet, Chew 1 tablet (81 mg total) by mouth daily., Disp: , Rfl:  .  atorvastatin (LIPITOR) 40 MG tablet, Take 1 tablet (40 mg total) by mouth daily at 6 PM., Disp: 90 tablet, Rfl: 1 .  Blood Glucose Monitoring Suppl (ONETOUCH VERIO) w/Device KIT, 1 Device by Does not apply route once., Disp: 1 kit, Rfl: 0 .  clopidogrel (PLAVIX) 75 MG tablet, Take 1 tablet (75 mg total) by mouth daily. Please make yearly appt with Dr. Fletcher [name] for January before anymore refills. 1st attempt, Disp: 30 tablet, Rfl: 0 .  fexofenadine (ALLEGRA ALLERGY) 180 MG tablet, Take 1 tablet (180 mg total) by mouth daily., Disp: 90 tablet, Rfl: 3 .  fluticasone (FLOVENT HFA) 44 MCG/ACT inhaler, Inhale 1 puff into the lungs 2 (two) times daily as needed (shortness of breath)., Disp: , Rfl:  .  nitroGLYCERIN (NITROSTAT) 0.4 MG SL tablet, Place 1 tablet (0.4 mg total) under the tongue every 5 (five) minutes as needed for chest pain., Disp: 25 tablet, Rfl: 4 .  Omega-3 Fatty Acids (FISH OIL) 1200 MG CAPS, Take  1,200 mg by mouth daily. , Disp: , Rfl:  .  omeprazole (PRILOSEC) 40 MG capsule, Take 1 capsule (40 mg total) by mouth daily., Disp: 90 capsule, Rfl: 3 .  ONE TOUCH LANCETS MISC, 1 each by Does not apply route 3 (three) times daily., Disp: 200 each, Rfl: 5 .  ONETOUCH VERIO test strip, USE TO TEST 3 TIMES DAILY, Disp: 100 each, Rfl: 5 .  ONETOUCH VERIO test strip, USE TO TEST 3 TIMES DAILY, Disp: 300 each, Rfl: 0 .  PROAIR HFA 108 (90 Base) MCG/ACT inhaler, USE 2  PUFFS EVERY 6 HOURS  AS NEEDED FOR WHEEZING OR  SHORTNESS OF BREATH, Disp: 34 g, Rfl: 3 .  traZODone (DESYREL) 100 MG tablet, Take 1 tablet (100 mg total) by mouth at bedtime., Disp: 90 tablet, Rfl: 1 .  azithromycin (ZITHROMAX) 250 MG tablet, Take 2 tablets today, then 1 tablet daily until gone., Disp: 6 tablet, Rfl: 0 .  diphenhydrAMINE (BENADRYL) 50 MG tablet, Take 0.5 tablets (25 mg total) by mouth once. 1 hr prior to CT scan, Disp: 30 tablet, Rfl: 0 .  Fluticasone-Umeclidin-Vilant (TRELEGY ELLIPTA) 100-62.5-25 MCG/INH AEPB, Inhale 1 puff into the lungs daily., Disp: 28 each, Rfl: 5 .  HYDROcodone-homatropine (HYCODAN) 5-1.5 MG/5ML syrup, Take 5 mLs by mouth every 8 (eight) hours as needed for cough. (Patient not taking: Reported on 12/25/2017), Disp: 120 mL, Rfl: 0    L , DO [facility] 12/25/2017 3:31 PM ] 

## 2017-12-25 NOTE — Patient Instructions (Addendum)
Thank you for visiting Dr. Valeta Harms at [facility]. Today we recommend the following: Orders Placed This Encounter  Procedures  . DG Chest 2 View  . QuantiFERON-TB Gold Plus  . Ambulatory Referral for DME  . Ambulatory Referral for Lung Cancer Scre  . Pulmonary Function Test   Meds ordered this encounter  Medications  . methylPREDNISolone acetate (DEPO-MEDROL) injection 80 mg  . azithromycin (ZITHROMAX) 250 MG tablet    Sig: Take 2 tablets today, then 1 tablet daily until gone.    Dispense:  6 tablet    Refill:  0  . Fluticasone-Umeclidin-Vilant (TRELEGY ELLIPTA) 100-62.5-25 MCG/INH AEPB    Sig: Inhale 1 puff into the lungs daily.    Dispense:  28 each    Refill:  5   Return in about 4 weeks (around 01/22/2018).

## 2017-12-26 ENCOUNTER — Telehealth: Payer: Self-pay | Admitting: Pulmonary Disease

## 2017-12-26 NOTE — Telephone Encounter (Signed)
Returned call to patient.  CXR results and recommendations given.  Patient stated understanding.  Nothing further at this time.

## 2017-12-27 LAB — QUANTIFERON-TB GOLD PLUS
Mitogen-NIL: 8.47 IU/mL
NIL: 0.03 IU/mL
QuantiFERON-TB Gold Plus: NEGATIVE
TB1-NIL: <0 IU/mL
TB2-NIL: 0 IU/mL

## 2017-12-30 ENCOUNTER — Telehealth: Payer: Self-pay | Admitting: Pulmonary Disease

## 2017-12-30 ENCOUNTER — Other Ambulatory Visit: Payer: Self-pay | Admitting: Internal Medicine

## 2017-12-30 DIAGNOSIS — J449 Chronic obstructive pulmonary disease, unspecified: Secondary | ICD-10-CM | POA: Diagnosis not present

## 2017-12-30 MED ORDER — ALBUTEROL SULFATE (2.5 MG/3ML) 0.083% IN NEBU: 2.5000 mg | INHALATION_SOLUTION | Freq: Four times a day (QID) | RESPIRATORY_TRACT | 5 refills | Status: DC | PRN

## 2017-12-30 NOTE — Telephone Encounter (Signed)
Notes recorded by Garner Nash, DO on 12/30/2017 at 7:43 AM EDT [place],   Can you please let the patient know that the lab work completed at the conclusion of their office visit was normal? Let me know if they have any questions.  His TB test was negative.     called and spoke with pt letting him know the results of the labwork. Pt expressed understanding. Nothing further needed with the results.  While speaking with pt, he stated he still has not heard anything about neb machine. Looked at the order and saw where the order went to [place] for the neb machine.  Called [facility] and spoke with Marissa to see if she could tell me the status of him receiving the machine. Per Jinger Neighbors had tried to call pt and left a message for him to return their call.  Called pt back and told him the information I found out from [place]. Pt stated he would all [facility]. Nothing further needed.

## 2017-12-30 NOTE — Telephone Encounter (Signed)
Spoke with pt and advised rx sent to pharmacy. Nothing further is needed.   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  He has no prior PFTs but likely has underlying obstructive sleep apnea.  Today presents with an acute exacerbation of his chronic lung disease with significant dyspnea and shortness of breath and chest tightness, wheezing and sputum production.  We will obtain a two-view chest x-ray give the patient an 80 mg shot of Depo-Medrol in the office today as well as start on prednisone and 5 days of azithromycin. We will give the patient a trilogy inhaler sample as well as refills of albuterol inhaler We will also give him the financial assistance paperwork for [place] to help pay for this inhaler if possible. We will also get him an albuterol nebulizer and machine. 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 Patient will be referred to our lung cancer screening program.  Return to clinic in 4 weeks with PFTs.

## 2018-01-01 ENCOUNTER — Other Ambulatory Visit: Payer: Self-pay | Admitting: Acute Care

## 2018-01-01 DIAGNOSIS — Z87891 Personal history of nicotine dependence: Secondary | ICD-10-CM

## 2018-01-01 DIAGNOSIS — F17210 Nicotine dependence, cigarettes, uncomplicated: Secondary | ICD-10-CM

## 2018-01-01 DIAGNOSIS — Z122 Encounter for screening for malignant neoplasm of respiratory organs: Secondary | ICD-10-CM

## 2018-01-02 ENCOUNTER — Telehealth: Payer: Self-pay | Admitting: *Deleted

## 2018-01-02 DIAGNOSIS — Z79899 Other long term (current) drug therapy: Secondary | ICD-10-CM

## 2018-01-02 DIAGNOSIS — I739 Peripheral vascular disease, unspecified: Secondary | ICD-10-CM | POA: Diagnosis not present

## 2018-01-02 DIAGNOSIS — I10 Essential (primary) hypertension: Secondary | ICD-10-CM

## 2018-01-02 DIAGNOSIS — Z01818 Encounter for other preprocedural examination: Secondary | ICD-10-CM | POA: Diagnosis not present

## 2018-01-02 MED ORDER — CLOPIDOGREL BISULFATE 75 MG PO TABS: 75.0000 mg | ORAL_TABLET | Freq: Every day | ORAL | 3 refills | Status: DC

## 2018-01-02 MED ORDER — CLOPIDOGREL BISULFATE 75 MG PO TABS: 75.0000 mg | ORAL_TABLET | Freq: Every day | ORAL | 0 refills | Status: DC

## 2018-01-02 NOTE — Telephone Encounter (Signed)
Call placed to the patient to remind him to get his pre procedure labs drawn. He stated that he had been at the [facility] office today and got them completed.  It was his understanding that the procedure was going to be done on Tuesday 10/22 per the AVS. It was explained to him that PV angiograms are only done on Wednesdays and he was currently scheduled for 10/23 with Dr. Fletcher [name]. He stated that he will try and rearrange his schedule and transportation to fit for Wednesday. If he cannot he will call back today. An apology was given for the misunderstanding and wrong date on the AVS.   He has also been instructed to not start his prednisone and benadryl for his contrast allergy on Monday but to start it on Tuesday prior to the procedure (same instructions but a day later). He verbalized his understanding.

## 2018-01-02 NOTE — Telephone Encounter (Signed)
PATIENT WALKED INTO OFFICE. PATIENT WANTED TO KNOW IF HE NEED TO CONTINUE WITH TAKING CLOPIDOGREL 75 MG DAILY --- NO REFILL  INFORMED PATIENT- NEED TO  CONTINUE MEDICATION-  E-SENT  TO LOCAL PHARMACY #30  X 0 REFILL AND MAIL ORDER #90 X 3 REFILLS . PATIENT VERBALIZED

## 2018-01-03 LAB — CBC
Hematocrit: 46.2 % (ref 37.5–51.0)
Hemoglobin: 16.4 g/dL (ref 13.0–17.7)
MCH: 34.2 pg — AB (ref 26.6–33.0)
MCHC: 35.5 g/dL (ref 31.5–35.7)
MCV: 97 fL (ref 79–97)
Platelets: 269 10*3/uL (ref 150–450)
RBC: 4.79 x10E6/uL (ref 4.14–5.80)
RDW: 13.9 % (ref 12.3–15.4)
WBC: 16.7 10*3/uL — AB (ref 3.4–10.8)

## 2018-01-03 LAB — BASIC METABOLIC PANEL
BUN / CREAT RATIO: 18 (ref 10–24)
BUN: 12 mg/dL (ref 8–27)
CO2: 27 mmol/L (ref 20–29)
Calcium: 9.7 mg/dL (ref 8.6–10.2)
Chloride: 97 mmol/L (ref 96–106)
Creatinine, Ser: 0.66 mg/dL — ABNORMAL LOW (ref 0.76–1.27)
GFR calc Af Amer: 121 mL/min/{1.73_m2} (ref >59)
GFR calc non Af Amer: 104 mL/min/{1.73_m2} (ref >59)
Glucose: 75 mg/dL (ref 65–99)
POTASSIUM: 4 mmol/L (ref 3.5–5.2)
SODIUM: 139 mmol/L (ref 134–144)

## 2018-01-05 ENCOUNTER — Telehealth: Payer: Self-pay | Admitting: *Deleted

## 2018-01-05 NOTE — Telephone Encounter (Signed)
Pt contacted pre-PV procedure scheduled at [facility] for: Wednesday January 07, 2018 8:30 AM Verified arrival time and place: [place] Entrance A at: 6:30 AM  No solid food after midnight prior to cath, clear liquids until 5 AM day of procedure. Verified no diabetes medications.  Verified contrast allergy-reviewed instructions for 13 hour prednisone and benadryl prep.  Prednisone 50 mg 01/06/18 7:30 PM, Prednisone 50 mg 01/07/18 1:30 AM, Prednisone 50 mg and Benadryl 50 mg just prior to leaving for hospital AM of procedure 01/07/18.   AM meds can be  taken pre-cath with sip of water including: ASA 81 mg Clopidogrel 75 mg Benadryl 50 mg Prednisone 50 mg  Confirmed patient has responsible person to drive home post procedure and for 24 hours after you arrive home: yes-pt also advised not to drive to hospital for procedure.

## 2018-01-07 ENCOUNTER — Encounter (HOSPITAL_COMMUNITY)
Admission: RE | Disposition: A | Discharge status: Home / Self Care | Payer: Self-pay | Source: Ambulatory Visit | Attending: Cardiovascular Disease

## 2018-01-07 ENCOUNTER — Ambulatory Visit (HOSPITAL_COMMUNITY)
Admission: RE | Admit: 2018-01-07 | Discharge: 2018-01-08 | Disposition: A | Discharge status: Home / Self Care | Payer: Medicare Other | Source: Ambulatory Visit | Attending: Cardiovascular Disease | Admitting: Cardiovascular Disease

## 2018-01-07 ENCOUNTER — Encounter (HOSPITAL_COMMUNITY): Payer: Self-pay | Admitting: General Practice

## 2018-01-07 ENCOUNTER — Telehealth: Payer: Self-pay | Admitting: *Deleted

## 2018-01-07 ENCOUNTER — Other Ambulatory Visit: Payer: Self-pay

## 2018-01-07 DIAGNOSIS — Z981 Arthrodesis status: Secondary | ICD-10-CM | POA: Insufficient documentation

## 2018-01-07 DIAGNOSIS — Z91041 Radiographic dye allergy status: Secondary | ICD-10-CM | POA: Diagnosis not present

## 2018-01-07 DIAGNOSIS — Z79899 Other long term (current) drug therapy: Secondary | ICD-10-CM | POA: Insufficient documentation

## 2018-01-07 DIAGNOSIS — Z9889 Other specified postprocedural states: Secondary | ICD-10-CM | POA: Insufficient documentation

## 2018-01-07 DIAGNOSIS — Z7982 Long term (current) use of aspirin: Secondary | ICD-10-CM | POA: Insufficient documentation

## 2018-01-07 DIAGNOSIS — I70213 Atherosclerosis of native arteries of extremities with intermittent claudication, bilateral legs: Secondary | ICD-10-CM

## 2018-01-07 DIAGNOSIS — Z7902 Long term (current) use of antithrombotics/antiplatelets: Secondary | ICD-10-CM | POA: Insufficient documentation

## 2018-01-07 DIAGNOSIS — I739 Peripheral vascular disease, unspecified: Secondary | ICD-10-CM | POA: Diagnosis present

## 2018-01-07 DIAGNOSIS — I10 Essential (primary) hypertension: Secondary | ICD-10-CM | POA: Diagnosis not present

## 2018-01-07 DIAGNOSIS — F1010 Alcohol abuse, uncomplicated: Secondary | ICD-10-CM | POA: Insufficient documentation

## 2018-01-07 DIAGNOSIS — K219 Gastro-esophageal reflux disease without esophagitis: Secondary | ICD-10-CM | POA: Insufficient documentation

## 2018-01-07 DIAGNOSIS — Z886 Allergy status to analgesic agent status: Secondary | ICD-10-CM | POA: Diagnosis not present

## 2018-01-07 DIAGNOSIS — M1990 Unspecified osteoarthritis, unspecified site: Secondary | ICD-10-CM | POA: Diagnosis not present

## 2018-01-07 DIAGNOSIS — Z955 Presence of coronary angioplasty implant and graft: Secondary | ICD-10-CM | POA: Insufficient documentation

## 2018-01-07 DIAGNOSIS — Z8249 Family history of ischemic heart disease and other diseases of the circulatory system: Secondary | ICD-10-CM | POA: Diagnosis not present

## 2018-01-07 DIAGNOSIS — E785 Hyperlipidemia, unspecified: Secondary | ICD-10-CM | POA: Insufficient documentation

## 2018-01-07 DIAGNOSIS — J440 Chronic obstructive pulmonary disease with acute lower respiratory infection: Secondary | ICD-10-CM | POA: Diagnosis not present

## 2018-01-07 DIAGNOSIS — F17210 Nicotine dependence, cigarettes, uncomplicated: Secondary | ICD-10-CM | POA: Diagnosis not present

## 2018-01-07 DIAGNOSIS — M109 Gout, unspecified: Secondary | ICD-10-CM | POA: Insufficient documentation

## 2018-01-07 DIAGNOSIS — Z823 Family history of stroke: Secondary | ICD-10-CM | POA: Insufficient documentation

## 2018-01-07 DIAGNOSIS — Z888 Allergy status to other drugs, medicaments and biological substances status: Secondary | ICD-10-CM | POA: Insufficient documentation

## 2018-01-07 DIAGNOSIS — J209 Acute bronchitis, unspecified: Secondary | ICD-10-CM | POA: Insufficient documentation

## 2018-01-07 HISTORY — DX: Peripheral vascular disease, unspecified: I73.9

## 2018-01-07 HISTORY — PX: PERIPHERAL VASCULAR ATHERECTOMY: CATH118256

## 2018-01-07 HISTORY — PX: PERIPHERAL VASCULAR BALLOON ANGIOPLASTY: CATH118281

## 2018-01-07 HISTORY — PX: ABDOMINAL AORTOGRAM W/LOWER EXTREMITY: CATH118223

## 2018-01-07 LAB — POCT ACTIVATED CLOTTING TIME
ACTIVATED CLOTTING TIME: 213 s
ACTIVATED CLOTTING TIME: 274 s
ACTIVATED CLOTTING TIME: 169 s

## 2018-01-07 LAB — GLUCOSE, CAPILLARY: GLUCOSE-CAPILLARY: 153 mg/dL — AB (ref 70–99)

## 2018-01-07 SURGERY — ABDOMINAL AORTOGRAM W/LOWER EXTREMITY
Anesthesia: LOCAL

## 2018-01-07 MED ORDER — TRAZODONE HCL 100 MG PO TABS
100.0000 mg | ORAL_TABLET | Freq: Every day | ORAL | Status: DC
  Administered 2018-01-07: 100 mg via ORAL
  Filled 2018-01-07: qty 1

## 2018-01-07 MED ORDER — ACETAMINOPHEN 325 MG PO TABS: 650.0000 mg | ORAL_TABLET | ORAL | Status: DC | PRN

## 2018-01-07 MED ORDER — SODIUM CHLORIDE 0.9% FLUSH: 3.0000 mL | INTRAVENOUS | Status: DC | PRN

## 2018-01-07 MED ORDER — OXYCODONE HCL 5 MG PO TABS: 5.0000 mg | ORAL_TABLET | ORAL | Status: DC | PRN

## 2018-01-07 MED ORDER — FENTANYL CITRATE (PF) 100 MCG/2ML IJ SOLN
INTRAMUSCULAR | Status: AC
  Filled 2018-01-07: qty 2

## 2018-01-07 MED ORDER — LIDOCAINE HCL (PF) 1 % IJ SOLN
INTRAMUSCULAR | Status: DC | PRN
  Administered 2018-01-07: 2 mL via INTRADERMAL

## 2018-01-07 MED ORDER — NITROGLYCERIN IN D5W 200-5 MCG/ML-% IV SOLN
INTRAVENOUS | Status: AC
  Filled 2018-01-07: qty 250

## 2018-01-07 MED ORDER — FENTANYL CITRATE (PF) 100 MCG/2ML IJ SOLN
INTRAMUSCULAR | Status: DC | PRN
  Administered 2018-01-07: 25 ug via INTRAVENOUS
  Administered 2018-01-07: 50 ug via INTRAVENOUS
  Administered 2018-01-07: 25 ug via INTRAVENOUS
  Administered 2018-01-07 (×2): 50 ug via INTRAVENOUS

## 2018-01-07 MED ORDER — ONETOUCH LANCETS MISC: 1.0000 | Freq: Three times a day (TID) | Status: DC

## 2018-01-07 MED ORDER — SODIUM CHLORIDE 0.9 % IV SOLN: 250.0000 mL | INTRAVENOUS | Status: DC | PRN

## 2018-01-07 MED ORDER — ALBUTEROL SULFATE (2.5 MG/3ML) 0.083% IN NEBU: 2.5000 mg | INHALATION_SOLUTION | Freq: Four times a day (QID) | RESPIRATORY_TRACT | Status: DC | PRN

## 2018-01-07 MED ORDER — LABETALOL HCL 5 MG/ML IV SOLN: 10.0000 mg | INTRAVENOUS | Status: DC | PRN

## 2018-01-07 MED ORDER — AMLODIPINE BESYLATE 5 MG PO TABS
5.0000 mg | ORAL_TABLET | Freq: Every day | ORAL | Status: DC
  Administered 2018-01-08: 5 mg via ORAL
  Filled 2018-01-07: qty 1

## 2018-01-07 MED ORDER — HEPARIN SODIUM (PORCINE) 1000 UNIT/ML IJ SOLN
INTRAMUSCULAR | Status: AC
  Filled 2018-01-07: qty 1

## 2018-01-07 MED ORDER — PREDNISONE 20 MG PO TABS
20.0000 mg | ORAL_TABLET | Freq: Every day | ORAL | Status: DC
  Administered 2018-01-08: 20 mg via ORAL
  Filled 2018-01-07: qty 1

## 2018-01-07 MED ORDER — SODIUM CHLORIDE 0.9% FLUSH
3.0000 mL | Freq: Two times a day (BID) | INTRAVENOUS | Status: DC
  Administered 2018-01-08: 3 mL via INTRAVENOUS

## 2018-01-07 MED ORDER — SODIUM CHLORIDE 0.9% FLUSH: 3.0000 mL | Freq: Two times a day (BID) | INTRAVENOUS | Status: DC

## 2018-01-07 MED ORDER — DIPHENHYDRAMINE HCL 25 MG PO CAPS: 25.0000 mg | ORAL_CAPSULE | Freq: Once | ORAL | Status: DC

## 2018-01-07 MED ORDER — ASPIRIN 81 MG PO CHEW: 81.0000 mg | CHEWABLE_TABLET | ORAL | Status: DC

## 2018-01-07 MED ORDER — HEPARIN SODIUM (PORCINE) 1000 UNIT/ML IJ SOLN
INTRAMUSCULAR | Status: DC | PRN
  Administered 2018-01-07: 7000 [IU] via INTRAVENOUS
  Administered 2018-01-07: 2000 [IU] via INTRAVENOUS

## 2018-01-07 MED ORDER — DIPHENHYDRAMINE HCL 25 MG PO TABS
25.0000 mg | ORAL_TABLET | Freq: Once | ORAL | Status: DC
  Filled 2018-01-07: qty 1

## 2018-01-07 MED ORDER — PANTOPRAZOLE SODIUM 40 MG PO TBEC
40.0000 mg | DELAYED_RELEASE_TABLET | Freq: Every day | ORAL | Status: DC
  Administered 2018-01-08: 40 mg via ORAL
  Filled 2018-01-07: qty 1

## 2018-01-07 MED ORDER — FLUTICASONE-UMECLIDIN-VILANT 100-62.5-25 MCG/INH IN AEPB: 1.0000 | INHALATION_SPRAY | Freq: Two times a day (BID) | RESPIRATORY_TRACT | Status: DC

## 2018-01-07 MED ORDER — VIPERSLIDE LUBRICANT OPTIME
TOPICAL | Status: DC | PRN
  Administered 2018-01-07: 09:00:00 via SURGICAL_CAVITY

## 2018-01-07 MED ORDER — NITROGLYCERIN 0.4 MG SL SUBL: 0.4000 mg | SUBLINGUAL_TABLET | SUBLINGUAL | Status: DC | PRN

## 2018-01-07 MED ORDER — ATORVASTATIN CALCIUM 40 MG PO TABS
40.0000 mg | ORAL_TABLET | Freq: Every day | ORAL | Status: DC
  Administered 2018-01-07: 40 mg via ORAL
  Filled 2018-01-07: qty 1

## 2018-01-07 MED ORDER — UMECLIDINIUM BROMIDE 62.5 MCG/INH IN AEPB
1.0000 | INHALATION_SPRAY | Freq: Every day | RESPIRATORY_TRACT | Status: DC
  Administered 2018-01-08: 1 via RESPIRATORY_TRACT
  Filled 2018-01-07: qty 7

## 2018-01-07 MED ORDER — SODIUM CHLORIDE 0.9 % IV SOLN
INTRAVENOUS | Status: DC
  Administered 2018-01-07: 07:00:00 via INTRAVENOUS

## 2018-01-07 MED ORDER — OMEGA-3-ACID ETHYL ESTERS 1 G PO CAPS
1.0000 g | ORAL_CAPSULE | Freq: Two times a day (BID) | ORAL | Status: DC
  Administered 2018-01-07 – 2018-01-08 (×2): 1 g via ORAL
  Filled 2018-01-07 (×2): qty 1

## 2018-01-07 MED ORDER — ONETOUCH VERIO W/DEVICE KIT: 1.0000 | PACK | Freq: Once | Status: DC

## 2018-01-07 MED ORDER — IODIXANOL 320 MG/ML IV SOLN
INTRAVENOUS | Status: DC | PRN
  Administered 2018-01-07: 160 mL via INTRA_ARTERIAL

## 2018-01-07 MED ORDER — HEPARIN (PORCINE) IN NACL 1000-0.9 UT/500ML-% IV SOLN
INTRAVENOUS | Status: DC | PRN
  Administered 2018-01-07 (×2): 500 mL

## 2018-01-07 MED ORDER — ASPIRIN EC 81 MG PO TBEC
81.0000 mg | DELAYED_RELEASE_TABLET | Freq: Every day | ORAL | Status: DC
  Administered 2018-01-08: 81 mg via ORAL
  Filled 2018-01-07: qty 1

## 2018-01-07 MED ORDER — HYDRALAZINE HCL 20 MG/ML IJ SOLN: 5.0000 mg | INTRAMUSCULAR | Status: DC | PRN

## 2018-01-07 MED ORDER — MIDAZOLAM HCL 2 MG/2ML IJ SOLN
INTRAMUSCULAR | Status: AC
  Filled 2018-01-07: qty 2

## 2018-01-07 MED ORDER — LIDOCAINE HCL (PF) 1 % IJ SOLN
INTRAMUSCULAR | Status: AC
  Filled 2018-01-07: qty 30

## 2018-01-07 MED ORDER — VERAPAMIL HCL 2.5 MG/ML IV SOLN
INTRAVENOUS | Status: AC
  Filled 2018-01-07: qty 2

## 2018-01-07 MED ORDER — HEPARIN (PORCINE) IN NACL 1000-0.9 UT/500ML-% IV SOLN
INTRAVENOUS | Status: AC
  Filled 2018-01-07: qty 500

## 2018-01-07 MED ORDER — LORATADINE 10 MG PO TABS
10.0000 mg | ORAL_TABLET | Freq: Every day | ORAL | Status: DC
  Administered 2018-01-08: 10 mg via ORAL
  Filled 2018-01-07: qty 1

## 2018-01-07 MED ORDER — SODIUM CHLORIDE 0.9 % WEIGHT BASED INFUSION: 1.0000 mL/kg/h | INTRAVENOUS | Status: AC

## 2018-01-07 MED ORDER — CLOPIDOGREL BISULFATE 75 MG PO TABS
75.0000 mg | ORAL_TABLET | Freq: Every day | ORAL | Status: DC
  Administered 2018-01-08: 75 mg via ORAL
  Filled 2018-01-07: qty 1

## 2018-01-07 MED ORDER — ONDANSETRON HCL 4 MG/2ML IJ SOLN: 4.0000 mg | Freq: Four times a day (QID) | INTRAMUSCULAR | Status: DC | PRN

## 2018-01-07 MED ORDER — NITROGLYCERIN 1 MG/10 ML FOR IR/CATH LAB
INTRA_ARTERIAL | Status: DC | PRN
  Administered 2018-01-07 (×3): 200 ug via INTRA_ARTERIAL

## 2018-01-07 MED ORDER — FLUTICASONE FUROATE-VILANTEROL 100-25 MCG/INH IN AEPB
1.0000 | INHALATION_SPRAY | Freq: Every day | RESPIRATORY_TRACT | Status: DC
  Administered 2018-01-08: 1 via RESPIRATORY_TRACT
  Filled 2018-01-07: qty 28

## 2018-01-07 MED ORDER — MIDAZOLAM HCL 2 MG/2ML IJ SOLN
INTRAMUSCULAR | Status: DC | PRN
  Administered 2018-01-07 (×2): 1 mg via INTRAVENOUS

## 2018-01-07 SURGICAL SUPPLY — 29 items
BALLN COYOTE ES OTW 4X40X145 (BALLOONS) ×3
BALLN LUTONIX DCB 6X40X130 (BALLOONS) ×3
BALLOON COYOTE ES OTW 4X40X145 (BALLOONS) ×2 IMPLANT
BALLOON LUTONIX DCB 6X40X130 (BALLOONS) ×2 IMPLANT
CATH ANGIO 5F PIGTAIL 100CM (CATHETERS) ×3 IMPLANT
CATH ANGIO 5F PIGTAIL 65CM (CATHETERS) ×3 IMPLANT
CATH CXI SUPP 2.6F 150 ST (CATHETERS) ×3 IMPLANT
CATH TEMPO AQUA 5F 100CM (CATHETERS) ×3 IMPLANT
DIAMONDBACK SOLID OAS 2.0MM (CATHETERS) ×3
KIT ENCORE 26 ADVANTAGE (KITS) ×3 IMPLANT
KIT MICROPUNCTURE NIT STIFF (SHEATH) ×3 IMPLANT
KIT PV (KITS) ×3 IMPLANT
LUBRICANT VIPERSLIDE CORONARY (MISCELLANEOUS) ×3 IMPLANT
SHEATH GUIDING CAROTID 6FRX90 (SHEATH) ×6 IMPLANT
SHEATH PINNACLE 5F 10CM (SHEATH) ×3 IMPLANT
SHEATH PINNACLE 6F 10CM (SHEATH) ×3 IMPLANT
SHEATH PROBE COVER 6X72 (BAG) ×3 IMPLANT
SHIELD RADPAD SCOOP 12X17 (MISCELLANEOUS) ×3 IMPLANT
STOPCOCK MORSE 400PSI 3WAY (MISCELLANEOUS) ×3 IMPLANT
SYRINGE MEDRAD AVANTA MACH 7 (SYRINGE) ×3 IMPLANT
SYSTEM DIMNDBCK SLD OAS 2.0MM (CATHETERS) ×2 IMPLANT
TRANSDUCER W/STOPCOCK (MISCELLANEOUS) ×3 IMPLANT
TRAY PV CATH (CUSTOM PROCEDURE TRAY) ×3 IMPLANT
TUBING HIGH PRESSURE 120CM (CONNECTOR) ×3 IMPLANT
WIRE HITORQ VERSACORE ST 145CM (WIRE) ×3 IMPLANT
WIRE RUNTHROUGH .014X300CM (WIRE) ×3 IMPLANT
WIRE TORQFLEX AUST .018X40CM (WIRE) ×3 IMPLANT
WIRE VERSACORE LOC 115CM (WIRE) ×3 IMPLANT
WIRE VIPER ADVANCE .017X335CM (WIRE) ×3 IMPLANT

## 2018-01-07 NOTE — Telephone Encounter (Signed)
Post procedural LEA and ABI duplex have been ordered. Message sent to scheduling for duplex appointment and follow up appointment with Dr. Fletcher [name].

## 2018-01-07 NOTE — Interval H&P Note (Signed)
History and Physical Interval Note:  01/07/2018 8:29 AM  Joshua [name]  has presented today for surgery, with the diagnosis of pad  The various methods of treatment have been discussed with the patient and family. After consideration of risks, benefits and other options for treatment, the patient has consented to  Procedure(s): ABDOMINAL AORTOGRAM W/LOWER EXTREMITY (N/A) as a surgical intervention .  The patient's history has been reviewed, patient examined, no change in status, stable for surgery.  I have reviewed the patient's chart and labs.  Questions were answered to the patient's satisfaction.     Kathlyn [place]

## 2018-01-07 NOTE — Progress Notes (Signed)
Site area: Left brachial a 6 french arterial sheaht was removed by Jason Fila  T/RCIS  Site Prior to Removal:  Level 0  Pressure Applied For 30 MINUTES    Bedrest Beginning at   Manual:   Yes.    Patient Status During Pull:  stable  Post Pull Groin Site:  Level 0  Post Pull Instructions Given:  Yes.    Post Pull Pulses Present:  Yes.    Dressing Applied:  Yes.    Comments:  VS remain stable

## 2018-01-08 ENCOUNTER — Ambulatory Visit (INDEPENDENT_AMBULATORY_CARE_PROVIDER_SITE_OTHER): Payer: Medicare Other | Admitting: Internal Medicine

## 2018-01-08 ENCOUNTER — Encounter (HOSPITAL_COMMUNITY): Payer: Self-pay | Admitting: Cardiovascular Disease

## 2018-01-08 VITALS — BP 150/80 | HR 80 | Temp 98.2°F | Ht 69.0 in | Wt 206.0 lb

## 2018-01-08 DIAGNOSIS — J209 Acute bronchitis, unspecified: Secondary | ICD-10-CM | POA: Diagnosis not present

## 2018-01-08 DIAGNOSIS — J449 Chronic obstructive pulmonary disease, unspecified: Secondary | ICD-10-CM

## 2018-01-08 DIAGNOSIS — Z886 Allergy status to analgesic agent status: Secondary | ICD-10-CM | POA: Diagnosis not present

## 2018-01-08 DIAGNOSIS — E119 Type 2 diabetes mellitus without complications: Secondary | ICD-10-CM | POA: Diagnosis not present

## 2018-01-08 DIAGNOSIS — Z7902 Long term (current) use of antithrombotics/antiplatelets: Secondary | ICD-10-CM | POA: Diagnosis not present

## 2018-01-08 DIAGNOSIS — Z8249 Family history of ischemic heart disease and other diseases of the circulatory system: Secondary | ICD-10-CM | POA: Diagnosis not present

## 2018-01-08 DIAGNOSIS — Z0000 Encounter for general adult medical examination without abnormal findings: Secondary | ICD-10-CM | POA: Diagnosis not present

## 2018-01-08 DIAGNOSIS — I2510 Atherosclerotic heart disease of native coronary artery without angina pectoris: Secondary | ICD-10-CM | POA: Diagnosis not present

## 2018-01-08 DIAGNOSIS — I10 Essential (primary) hypertension: Secondary | ICD-10-CM

## 2018-01-08 DIAGNOSIS — M109 Gout, unspecified: Secondary | ICD-10-CM | POA: Diagnosis not present

## 2018-01-08 DIAGNOSIS — Z823 Family history of stroke: Secondary | ICD-10-CM | POA: Diagnosis not present

## 2018-01-08 DIAGNOSIS — K219 Gastro-esophageal reflux disease without esophagitis: Secondary | ICD-10-CM | POA: Diagnosis not present

## 2018-01-08 DIAGNOSIS — E785 Hyperlipidemia, unspecified: Secondary | ICD-10-CM | POA: Diagnosis not present

## 2018-01-08 DIAGNOSIS — E782 Mixed hyperlipidemia: Secondary | ICD-10-CM

## 2018-01-08 DIAGNOSIS — Z955 Presence of coronary angioplasty implant and graft: Secondary | ICD-10-CM | POA: Diagnosis not present

## 2018-01-08 DIAGNOSIS — Z9889 Other specified postprocedural states: Secondary | ICD-10-CM | POA: Diagnosis not present

## 2018-01-08 DIAGNOSIS — Z981 Arthrodesis status: Secondary | ICD-10-CM | POA: Diagnosis not present

## 2018-01-08 DIAGNOSIS — F5101 Primary insomnia: Secondary | ICD-10-CM

## 2018-01-08 DIAGNOSIS — Z888 Allergy status to other drugs, medicaments and biological substances status: Secondary | ICD-10-CM | POA: Diagnosis not present

## 2018-01-08 DIAGNOSIS — I739 Peripheral vascular disease, unspecified: Secondary | ICD-10-CM | POA: Diagnosis not present

## 2018-01-08 DIAGNOSIS — J440 Chronic obstructive pulmonary disease with acute lower respiratory infection: Secondary | ICD-10-CM | POA: Diagnosis not present

## 2018-01-08 DIAGNOSIS — I70213 Atherosclerosis of native arteries of extremities with intermittent claudication, bilateral legs: Secondary | ICD-10-CM | POA: Diagnosis not present

## 2018-01-08 DIAGNOSIS — Z79899 Other long term (current) drug therapy: Secondary | ICD-10-CM | POA: Diagnosis not present

## 2018-01-08 DIAGNOSIS — M1A00X0 Idiopathic chronic gout, unspecified site, without tophus (tophi): Secondary | ICD-10-CM

## 2018-01-08 DIAGNOSIS — Z91041 Radiographic dye allergy status: Secondary | ICD-10-CM | POA: Diagnosis not present

## 2018-01-08 DIAGNOSIS — M1990 Unspecified osteoarthritis, unspecified site: Secondary | ICD-10-CM

## 2018-01-08 DIAGNOSIS — Z7982 Long term (current) use of aspirin: Secondary | ICD-10-CM | POA: Diagnosis not present

## 2018-01-08 DIAGNOSIS — E1151 Type 2 diabetes mellitus with diabetic peripheral angiopathy without gangrene: Secondary | ICD-10-CM

## 2018-01-08 MED ORDER — CLOPIDOGREL BISULFATE 75 MG PO TABS: 75.0000 mg | ORAL_TABLET | Freq: Every day | ORAL | 3 refills | Status: DC

## 2018-01-08 NOTE — Discharge Summary (Addendum)
Discharge Summary    Patient ID: Joshua [name] MRN: [id number]; DOB: Dec 11, 1955  Admit date: 01/07/2018 Discharge date: 01/08/2018  Primary Care Provider: Jearld Fenton, NP  Primary Cardiologist: Candee Furbish, MD  Primary Electrophysiologist:  None   Discharge Diagnoses    Active Problems:   PAD (peripheral artery disease) ([place])   Allergies Allergies  Allergen Reactions  . Lisinopril Hives and Itching  . Amitriptyline Other (See Comments)    Makes pt feel "weird" and nausea.  . Contrast Media [Iodinated Diagnostic Agents] Hives and Itching    Pt can use if taking benadryl   . Ibuprofen Other (See Comments)    Internal bleeding    Diagnostic Studies/Procedures    abdominal aortogram with lower extremity runoff 01/07/18 Conclusion   1. Widely patent aortobifemoral bypass with no obstructive disease. 2. Right lower extremity: Severe calcified stenosis affecting the ostial SFA with moderate calcified disease in the mid and distal segment, three-vessel runoff below the knee. 3. Left lower extremity: 60% ostial stenosis of the SFA with no other obstructive disease. 4. Successful orbital atherectomy and drug-coated balloon angioplasty to the ostial and proximal right SFA.  Recommendations: The patient is already on dual antiplatelet therapy. Continue aggressive treatment of risk factors. Due to brachial artery access, monitor overnight and likely discharge home tomorrow.      History of Present Illness     61 y.o. male with known history of coronary artery disease status post rotational atherectomy and drug-eluting stent placement to the mid LAD, peripheral arterial disease status post aortobifemoral bypass in 2010 at [place], diabetes mellitus with diabetic neuropathy, hyperlipidemia and tobacco use admitted for elective abdominal aortogram with lower extremity runoff, due to worsening claudication and concern for critical limb ischemia. Recent arterial LE dopplers  w/ ABIs showed an ABI of 0.75 on the right and 0.98 on the left.   Hospital Course     Pt presented to [facility] on 01/07/18 for the planned procedure, performed by Dr. Fletcher [name]. Access was obtained via the left brachial artery. LE PV angio showed widely patent aortobifemoral bypass with no obstructive disease. Right lower extremity: Severe calcified stenosis affecting the ostial SFA with moderate calcified disease in the mid and distal segment, three-vessel runoff below the knee. Left lower extremity: 60% ostial stenosis of the SFA with no other obstructive disease. He underwent successful orbital atherectomy and drug-coated balloon angioplasty to the ostial and proximal right SFA.   He tolerated the procedure well and left the cath lab in stable condition. He was monitored overnight and had no post procedural complications. He denied any leg pain. Palpable pulses noted. His brachial artery cath access site remained stable w/o any signs of complication. No chest pain or dyspnea. Vital signs remained stable. He was last seen and examined by Dr. Marlou Porch who determined he was stable for discharge home.   He will continue on DAPT w/ ASA and Plavix, in addition to statin therapy, Lipitor 40 mg. Smoking cessation strongly advised. He is scheduled for f/u LE arterial dopplers on 01/21/18 and has f/u with Dr. Fletcher [name] on 01/27/18.    Consultants: none   Discharge Vitals Blood pressure (!) 162/72, pulse 70, temperature 98.6 F (37 C), temperature source Oral, resp. rate (!) 24, height '5\' 9"'  (1.753 m), weight 89.9 kg, SpO2 98 %.  Filed Weights   01/07/18 0629 01/08/18 0508  Weight: 90.7 kg 89.9 kg    Labs & Radiologic Studies    CBC No results for input(s): WBC, NEUTROABS,  HGB, HCT, MCV, PLT in the last 72 hours. Basic Metabolic Panel No results for input(s): NA, K, CL, CO2, GLUCOSE, BUN, CREATININE, CALCIUM, MG, PHOS in the last 72 hours. Liver Function Tests No results for input(s): AST, ALT, ALKPHOS,  BILITOT, PROT, ALBUMIN in the last 72 hours. No results for input(s): LIPASE, AMYLASE in the last 72 hours. Cardiac Enzymes No results for input(s): CKTOTAL, CKMB, CKMBINDEX, TROPONINI in the last 72 hours. BNP Invalid input(s): POCBNP D-Dimer No results for input(s): DDIMER in the last 72 hours. Hemoglobin A1C No results for input(s): HGBA1C in the last 72 hours. Fasting Lipid Panel No results for input(s): CHOL, HDL, LDLCALC, TRIG, CHOLHDL, LDLDIRECT in the last 72 hours. Thyroid Function Tests No results for input(s): TSH, T4TOTAL, T3FREE, THYROIDAB in the last 72 hours.  Invalid input(s): FREET3 _____________  Dg Chest 2 View  Result Date: 12/26/2017 CLINICAL DATA:  Shortness of breath, productive cough EXAM: CHEST - 2 VIEW COMPARISON:  Chest CT 12/05/2016 FINDINGS: Mild peribronchial thickening. Mild hyperinflation/emphysema. No confluent opacities or effusions. No acute bony abnormality. IMPRESSION: Mild COPD and bronchitic changes. Electronically Signed   By: Rolm Baptise M.D.   On: 12/26/2017 09:17   Disposition   Pt is being discharged home today in good condition.  Follow-up Plans & Appointments    Follow-up Information    [facility] Follow up on 01/21/2018.   Specialty:  Cardiology Why:  9:30 AM appointment for repeat ultrasound of legs Contact information: [place] [phone]       Wellington Hampshire, MD Follow up on 01/27/2018.   Specialty:  Cardiology Why:  10:00 AM  Contact information: [address] [place] [address] [phone]          Discharge Instructions    Diet - low sodium heart healthy   Complete by:  As directed    Increase activity slowly   Complete by:  As directed       Discharge Medications   Allergies as of 01/08/2018      Reactions   Lisinopril Hives, Itching   Amitriptyline Other (See Comments)   Makes pt feel "weird" and nausea.    Contrast Media [iodinated Diagnostic Agents] Hives, Itching   Pt can use if taking benadryl    Ibuprofen Other (See Comments)   Internal bleeding      Medication List    STOP taking these medications   azithromycin 250 MG tablet Commonly known as:  ZITHROMAX   naproxen sodium 220 MG tablet Commonly known as:  ALEVE     TAKE these medications   amLODipine 5 MG tablet Commonly known as:  NORVASC Take 1 tablet (5 mg total) by mouth daily.   aspirin EC 81 MG tablet Take 81 mg by mouth daily.   aspirin 81 MG chewable tablet Chew 1 tablet (81 mg total) by mouth daily.   atorvastatin 40 MG tablet Commonly known as:  LIPITOR Take 1 tablet (40 mg total) by mouth daily at 6 PM.   clopidogrel 75 MG tablet Commonly known as:  PLAVIX Take 1 tablet (75 mg total) by mouth daily.   diphenhydrAMINE 50 MG tablet Commonly known as:  BENADRYL Take 0.5 tablets (25 mg total) by mouth once. 1 hr prior to CT scan   fexofenadine 180 MG tablet Commonly known as:  ALLEGRA Take 1 tablet (180 mg total) by mouth daily.   Fish Oil 1200 MG Caps Take 1,200 mg  by mouth daily.   Fluticasone-Umeclidin-Vilant 100-62.5-25 MCG/INH Aepb Inhale 1 puff into the lungs daily. What changed:  when to take this   HYDROcodone-homatropine 5-1.5 MG/5ML syrup Commonly known as:  HYCODAN Take 5 mLs by mouth every 8 (eight) hours as needed for cough.   nitroGLYCERIN 0.4 MG SL tablet Commonly known as:  NITROSTAT Place 1 tablet (0.4 mg total) under the tongue every 5 (five) minutes as needed for chest pain.   omeprazole 40 MG capsule Commonly known as:  PRILOSEC Take 1 capsule (40 mg total) by mouth daily.   ONE TOUCH LANCETS Misc 1 each by Does not apply route 3 (three) times daily.   ONETOUCH VERIO test strip Generic drug:  glucose blood USE TO TEST 3 TIMES DAILY   ONETOUCH VERIO test strip Generic drug:  glucose blood USE TO TEST 3 TIMES DAILY   ONETOUCH VERIO w/Device Kit 1 Device by Does not  apply route once.   OVER THE COUNTER MEDICATION Apply 1 application topically daily as needed (neuropathy pain). Hemp oil cream   predniSONE 10 MG tablet Commonly known as:  DELTASONE Take 20 mg by mouth daily with breakfast.   PROAIR HFA 108 (90 Base) MCG/ACT inhaler Generic drug:  albuterol USE 2 PUFFS EVERY 6 HOURS  AS NEEDED FOR WHEEZING OR  SHORTNESS OF BREATH What changed:  See the new instructions.   albuterol (2.5 MG/3ML) 0.083% nebulizer solution Commonly known as:  PROVENTIL Take 3 mLs (2.5 mg total) by nebulization every 6 (six) hours as needed for wheezing or shortness of breath. What changed:  Another medication with the same name was changed. Make sure you understand how and when to take each.   traZODone 100 MG tablet Commonly known as:  DESYREL Take 1 tablet (100 mg total) by mouth at bedtime. What changed:    when to take this  reasons to take this        Acute coronary syndrome (MI, NSTEMI, STEMI, etc) this admission?: No.    Outstanding Labs/Studies   Repeat LE Arterial Dopplers- 01/21/18 at [facility]   Duration of Discharge Encounter   Greater than 30 minutes including physician time.  Signed, Lyda Jester, PA-C 01/08/2018, 68:85 AM   62 year old with peripheral arterial disease status post orbital atherectomy and drug-coated balloon angioplasty to the ostial and proximal right SFA by Dr. Fletcher [name].  This was approached via the left brachial artery.  Overall feels well, no chest pain, ambulating well, neurologically intact, left brachial site is mildly protuberant but demonstrates good pulse no bruits.  No lower extremity edema, heart is regular rate and rhythm.  Lungs with mild wheeze bilaterally.  Lab work from yesterday personally reviewed  Assessment and plan:  Peripheral vascular disease/diabetes with hypertension smoking hyperlipidemia - SFA intervention right.  Continue with dual antiplatelet therapy, high intensity statin therapy 40 mg  of Lipitor, smoking cessation discussed at length.  He has follow-up Dopplers in November.  Overall okay for discharge.  Candee Furbish, MD

## 2018-01-08 NOTE — Progress Notes (Signed)
HPI:  Pt presents to the clinic today for his Medicare Wellness Exam. He is also due to follow up chronic conditions.   Arthritis: Mainly in his back. He takes Lyrica for radicular pain with good relief.  HLD with CAD: His last LDL was 70, 11/2017. He is taking Fish Oil, Atorvastatin and Plavix as prescribed. He denies myalgias or chest pain. He does not consume a low fat diet.  COPD: He denies chronic cough or shortness of breath. He does continue to smoke. He is taking Trelegydaily as prescribed and Albuterol as needed with good relief. PFT's from 12/2017 reviewed.  GERD: He denies breakthrough on Omeprazole. Upper GI from 12/2016 reviewed.  GOUT: No recent flare. He takes Colchicine as needed with good relief.  HTN: His BP today is 152/84. He is taking Amlodipine, as prescribed. ECG from 10/2017 reviewed.  PAD: He was just discharged today from abdominal aortigram. He is taking Atorvastatin and Plavix as prescribed. He follows with Dr. Fletcher [name].  DM 2: His last A1C was 5.8%, 09/2016. He is not taking any diabetic medications at this time. He does not check his sugars. He checks his feet routinely.  Insomnia: He has difficulty staying asleep. He is taking Trazadone as prescribed with some results. Sleep study from 04/2015 reviewed.  Past Medical History:  Diagnosis Date  . AC (acromioclavicular) joint bone spurs, unspecified laterality 11/25/2016  . Arthritis    "back" (05/16/2015)  . CAD in native artery, occluded Lcx and rotational atherectomy to LAD with DES 05/17/15 05/18/2015  . Chronic back pain    "all my back"  . Chronic bronchitis ([place])   . COPD (chronic obstructive pulmonary disease) ([place])   . GERD (gastroesophageal reflux disease)   . H/O blood clots    "had them in my back; put filter in before one of my neck ORs"  . History of gout   . Hyperlipidemia   . Hypertension   . PAD (peripheral artery disease) ([place])   . Phlebitis   . Positive TB test   . Seasonal allergies   .  Shortness of breath   . Spinal disease     Current Outpatient Medications  Medication Sig Dispense Refill  . albuterol (PROVENTIL) (2.5 MG/3ML) 0.083% nebulizer solution Take 3 mLs (2.5 mg total) by nebulization every 6 (six) hours as needed for wheezing or shortness of breath. 75 mL 5  . amLODipine (NORVASC) 5 MG tablet Take 1 tablet (5 mg total) by mouth daily. 90 tablet 1  . aspirin 81 MG chewable tablet Chew 1 tablet (81 mg total) by mouth daily.    [name] aspirin EC 81 MG tablet Take 81 mg by mouth daily.    [name] atorvastatin (LIPITOR) 40 MG tablet Take 1 tablet (40 mg total) by mouth daily at 6 PM. 90 tablet 1  . Blood Glucose Monitoring Suppl (ONETOUCH VERIO) w/Device KIT 1 Device by Does not apply route once. 1 kit 0  . clopidogrel (PLAVIX) 75 MG tablet Take 1 tablet (75 mg total) by mouth daily. 90 tablet 3  . diphenhydrAMINE (BENADRYL) 50 MG tablet Take 0.5 tablets (25 mg total) by mouth once. 1 hr prior to CT scan 30 tablet 0  . fexofenadine (ALLEGRA ALLERGY) 180 MG tablet Take 1 tablet (180 mg total) by mouth daily. 90 tablet 3  . Fluticasone-Umeclidin-Vilant (TRELEGY ELLIPTA) 100-62.5-25 MCG/INH AEPB Inhale 1 puff into the lungs daily. (Patient taking differently: Inhale 1 puff into the lungs 2 (two) times daily. ) 28 each 5  .  HYDROcodone-homatropine (HYCODAN) 5-1.5 MG/5ML syrup Take 5 mLs by mouth every 8 (eight) hours as needed for cough. (Patient not taking: Reported on 12/25/2017) 120 mL 0  . nitroGLYCERIN (NITROSTAT) 0.4 MG SL tablet Place 1 tablet (0.4 mg total) under the tongue every 5 (five) minutes as needed for chest pain. 25 tablet 4  . Omega-3 Fatty Acids (FISH OIL) 1200 MG CAPS Take 1,200 mg by mouth daily.     [name] omeprazole (PRILOSEC) 40 MG capsule Take 1 capsule (40 mg total) by mouth daily. 90 capsule 3  . ONE TOUCH LANCETS MISC 1 each by Does not apply route 3 (three) times daily. 200 each 5  . ONETOUCH VERIO test strip USE TO TEST 3 TIMES DAILY 300 each 0  . ONETOUCH  VERIO test strip USE TO TEST 3 TIMES DAILY 100 each 1  . OVER THE COUNTER MEDICATION Apply 1 application topically daily as needed (neuropathy pain). Hemp oil cream    . predniSONE (DELTASONE) 10 MG tablet Take 20 mg by mouth daily with breakfast.    . PROAIR HFA 108 (90 Base) MCG/ACT inhaler USE 2 PUFFS EVERY 6 HOURS  AS NEEDED FOR WHEEZING OR  SHORTNESS OF BREATH (Patient taking differently: Inhale 2 puffs into the lungs every 6 (six) hours as needed for wheezing or shortness of breath. ) 34 g 3  . traZODone (DESYREL) 100 MG tablet Take 1 tablet (100 mg total) by mouth at bedtime. (Patient taking differently: Take 100 mg by mouth at bedtime as needed for sleep. ) 90 tablet 1   No current facility-administered medications for this visit.    Facility-Administered Medications Ordered in Other Visits  Medication Dose Route Frequency Provider Last Rate Last Dose  . 0.9 %  sodium chloride infusion  250 mL Intravenous PRN Wellington Hampshire, MD      . acetaminophen (TYLENOL) tablet 650 mg  650 mg Oral Q4H PRN Kathlyn Sacramento A, MD      . albuterol (PROVENTIL) (2.5 MG/3ML) 0.083% nebulizer solution 2.5 mg  2.5 mg Nebulization Q6H PRN Kathlyn Sacramento A, MD      . amLODipine (NORVASC) tablet 5 mg  5 mg Oral Daily Kathlyn Sacramento A, MD   5 mg at 01/08/18 0859  . aspirin EC tablet 81 mg  81 mg Oral Daily Kathlyn Sacramento A, MD   81 mg at 01/08/18 0858  . atorvastatin (LIPITOR) tablet 40 mg  40 mg Oral q1800 Wellington Hampshire, MD   40 mg at 01/07/18 1727  . clopidogrel (PLAVIX) tablet 75 mg  75 mg Oral Daily Kathlyn Sacramento A, MD   75 mg at 01/08/18 0859  . diphenhydrAMINE (BENADRYL) capsule 25 mg  25 mg Oral Once Kathlyn Sacramento A, MD      . fluticasone furoate-vilanterol (BREO ELLIPTA) 100-25 MCG/INH 1 puff  1 puff Inhalation Daily Kathlyn Sacramento A, MD   1 puff at 01/08/18 0818   And  . umeclidinium bromide (INCRUSE ELLIPTA) 62.5 MCG/INH 1 puff  1 puff Inhalation Daily Wellington Hampshire, MD   1 puff at  01/08/18 0818  . hydrALAZINE (APRESOLINE) injection 5 mg  5 mg Intravenous Q20 Min PRN Kathlyn Sacramento A, MD      . labetalol (NORMODYNE,TRANDATE) injection 10 mg  10 mg Intravenous Q10 min PRN Kathlyn Sacramento A, MD      . loratadine (CLARITIN) tablet 10 mg  10 mg Oral Daily Wellington Hampshire, MD   10 mg at 01/08/18 0858  . nitroGLYCERIN (  NITROSTAT) SL tablet 0.4 mg  0.4 mg Sublingual Q5 min PRN Kathlyn Sacramento A, MD      . omega-3 acid ethyl esters (LOVAZA) capsule 1 g  1 g Oral BID Wellington Hampshire, MD   1 g at 01/08/18 0856  . ondansetron (ZOFRAN) injection 4 mg  4 mg Intravenous Q6H PRN Kathlyn Sacramento A, MD      . oxyCODONE (Oxy IR/ROXICODONE) immediate release tablet 5-10 mg  5-10 mg Oral Q4H PRN Kathlyn Sacramento A, MD      . pantoprazole (PROTONIX) EC tablet 40 mg  40 mg Oral Daily Kathlyn Sacramento A, MD   40 mg at 01/08/18 0858  . predniSONE (DELTASONE) tablet 20 mg  20 mg Oral Q breakfast Kathlyn Sacramento A, MD   20 mg at 01/08/18 0857  . sodium chloride flush (NS) 0.9 % injection 3 mL  3 mL Intravenous Q12H Kathlyn Sacramento A, MD   3 mL at 01/08/18 0900  . sodium chloride flush (NS) 0.9 % injection 3 mL  3 mL Intravenous PRN Kathlyn Sacramento A, MD      . traZODone (DESYREL) tablet 100 mg  100 mg Oral QHS Kathlyn Sacramento A, MD   100 mg at 01/07/18 2148    Allergies  Allergen Reactions  . Lisinopril Hives and Itching  . Amitriptyline Other (See Comments)    Makes pt feel "weird" and nausea.  . Contrast Media [Iodinated Diagnostic Agents] Hives and Itching    Pt can use if taking benadryl   . Ibuprofen Other (See Comments)    Internal bleeding    Family History  Problem Relation Age of Onset  . Stomach cancer Mother        Deceased, 19s  . Hypertension Mother   . Hypertension Father   . Stroke Father   . Heart attack Father        Deceased, 66  . Aneurysm Brother   . Healthy Daughter   . Healthy Maternal Grandmother   . Leukemia Grandchild   . Diabetes Neg Hx   . Early  death Neg Hx   . Colon cancer Neg Hx   . Pancreatic cancer Neg Hx     Social History   Socioeconomic History  . Marital status: Married    Spouse name: Not on file  . Number of children: Not on file  . Years of education: 7  . Highest education level: Not on file  Occupational History  . Occupation: Disabled    Employer: DISABLED  Social Needs  . Financial resource strain: Not on file  . Food insecurity:    Worry: Not on file    Inability: Not on file  . Transportation needs:    Medical: Not on file    Non-medical: Not on file  Tobacco Use  . Smoking status: Current Every Day Smoker    Packs/day: 0.50    Years: 47.00    Pack years: 23.50    Types: Cigarettes  . Smokeless tobacco: Never Used  . Tobacco comment: half a pack a day 12/25/2017  Substance and Sexual Activity  . Alcohol use: Yes    Alcohol/week: 0.0 standard drinks    Comment: 2019 NO ALCOHOL IN 2 MONTHS  . Drug use: No  . Sexual activity: Not Currently  Lifestyle  . Physical activity:    Days per week: Not on file    Minutes per session: Not on file  . Stress: Not on file  Relationships  . Social connections:  Talks on phone: Not on file    Gets together: Not on file    Attends religious service: Not on file    Active member of club or organization: Not on file    Attends meetings of clubs or organizations: Not on file    Relationship status: Not on file  . Intimate partner violence:    Fear of current or ex partner: Not on file    Emotionally abused: Not on file    Physically abused: Not on file    Forced sexual activity: Not on file  Other Topics Concern  . Not on file  Social History Narrative   Regular exercise-no   Caffeine Use-yes   Lives with wife in a one story home. Has 2 children.  On disability for low back pain.  Used to work as a Games developer, lasted worked in 2007.     Education: 7th grade.    Hospitiliaztions: None  Health Maintenance:    Flu: 02/2017  Tetanus:  08/2012  Pneumovax: 02/2015  PSA: 09/2015  Colon Screening: 08/2008  Eye Doctor: as needed  Dental Exam: as needed   Providers:   PCP: Webb Silversmith, NP-C  Cardiologist: Dr. Fletcher [name]  Gastroenterologist: Dr. Henrene Pastor  Pulmonologist: Dr. Valeta Harms   I have personally reviewed and have noted:  1. The patient's medical and social history 2. Their use of alcohol, tobacco or illicit drugs 3. Their current medications and supplements 4. The patient's functional ability including ADL's, fall risks, home safety risks and hearing or visual impairment. 5. Diet and physical activities 6. Evidence for depression or mood disorder  Subjective:   Review of Systems:   Constitutional: Denies fever, malaise, fatigue, headache or abrupt weight changes.  HEENT: Denies eye pain, eye redness, ear pain, ringing in the ears, wax buildup, runny nose, nasal congestion, bloody nose, or sore throat. Respiratory: Denies difficulty breathing, shortness of breath, cough or sputum production.   Cardiovascular: Denies chest pain, chest tightness, palpitations or swelling in the hands or feet.  Gastrointestinal: Denies abdominal pain, bloating, constipation, diarrhea or blood in the stool.  GU: Denies urgency, frequency, pain with urination, burning sensation, blood in urine, odor or discharge. Musculoskeletal: Pt reports chronic back pain. Denies decrease in range of motion, difficulty with gait, muscle pain or joint swelling.  Skin: Denies redness, rashes, lesions or ulcercations.  Neurological: Denies dizziness, difficulty with memory, difficulty with speech or problems with balance and coordination.  Psych: Denies anxiety, depression, SI/HI.  No other specific complaints in a complete review of systems (except as listed in HPI above).  Objective:  PE:   BP (!) 150/80   Pulse 80   Temp 98.2 F (36.8 C) (Oral)   Ht '5\' 9"'  (1.753 m)   Wt 206 lb (93.4 kg)   SpO2 98%   BMI 30.42 kg/m   Wt Readings from Last 3  Encounters:  01/08/18 198 lb 3.2 oz (89.9 kg)  12/25/17 200 lb 6.4 oz (90.9 kg)  12/18/17 194 lb (88 kg)    General: Appears his stated age, well developed, well nourished in NAD. Skin: Warm, dry and intact. No  ulcerations noted. HEENT: Head: normal shape and size; Eyes: sclera white, no icterus, conjunctiva pink, PERRLA and EOMs intact; Ears: Tm's gray and intact, normal light reflex; Throat/Mouth: Teeth present, mucosa pink and moist, no exudate, lesions or ulcerations noted.  Neck: Neck supple, trachea midline. No masses, lumps or thyromegaly present.  Cardiovascular: Normal rate and rhythm. S1,S2 noted.  No murmur,  rubs or gallops noted. No JVD or BLE edema. No carotid bruits noted. Pulmonary/Chest: Normal effort and positive vesicular breath sounds. No respiratory distress. No wheezes, rales or ronchi noted.  Abdomen: Soft and nontender. Normal bowel sounds. No distention or masses noted. Liver, spleen and kidneys non palpable. Musculoskeletal: Normal range of motion. Strength 5/5 BUE/BLE. No difficulty with gait.  Neurological: Alert and oriented. Cranial nerves II-XII grossly intact. Coordination normal.  Psychiatric: Mood and affect normal. Behavior is normal. Judgment and thought content normal.     BMET    Component Value Date/Time   NA 139 01/02/2018 0927   NA 140 11/25/2016 1430   K 4.0 01/02/2018 0927   K 4.3 11/25/2016 1430   CL 97 01/02/2018 0927   CO2 27 01/02/2018 0927   CO2 27 11/25/2016 1430   GLUCOSE 75 01/02/2018 0927   GLUCOSE 101 11/25/2016 1430   BUN 12 01/02/2018 0927   BUN 12.6 11/25/2016 1430   CREATININE 0.66 (L) 01/02/2018 0927   CREATININE 0.9 11/25/2016 1430   CALCIUM 9.7 01/02/2018 0927   CALCIUM 10.3 11/25/2016 1430   GFRNONAA 104 01/02/2018 0927   GFRAA 121 01/02/2018 0927    Lipid Panel     Component Value Date/Time   CHOL 215 (H) 11/21/2017 0810   TRIG 94 11/21/2017 0810   HDL 126 11/21/2017 0810   CHOLHDL 1.7 11/21/2017 0810    CHOLHDL 3 10/14/2016 1440   VLDL 25.0 10/14/2016 1440   LDLCALC 70 11/21/2017 0810    CBC    Component Value Date/Time   WBC 16.7 (H) 01/02/2018 0927   WBC 12.9 (H) 11/25/2016 1430   WBC 12.3 (H) 10/14/2016 1440   RBC 4.79 01/02/2018 0927   RBC 5.94 (H) 11/25/2016 1430   RBC 6.19 (H) 10/14/2016 1440   HGB 16.4 01/02/2018 0927   HGB 19.2 (H) 11/25/2016 1430   HCT 46.2 01/02/2018 0927   HCT 54.8 (H) 11/25/2016 1430   PLT 269 01/02/2018 0927   MCV 97 01/02/2018 0927   MCV 92.3 11/25/2016 1430   MCH 34.2 (H) 01/02/2018 0927   MCH 32.3 11/25/2016 1430   MCH 29.8 02/13/2016 0949   MCHC 35.5 01/02/2018 0927   MCHC 35.0 11/25/2016 1430   MCHC 33.4 10/14/2016 1440   RDW 13.9 01/02/2018 0927   RDW 15.0 (H) 11/25/2016 1430   LYMPHSABS 2.5 11/25/2016 1430   MONOABS 1.3 (H) 11/25/2016 1430   EOSABS 0.3 11/25/2016 1430   BASOSABS 0.0 11/25/2016 1430    Hgb A1C Lab Results  Component Value Date   HGBA1C 5.8 10/14/2016      Assessment and Plan:   Medicare Annual Wellness Visit:  Diet: He does eat meat. He consumes fruits and veggies daily. He does eat fried foods. He drinks water, sweet tea. Physical activity: Sedentary Depression/mood screen: Negative Hearing: Intact to whispered voice Visual acuity: Grossly normal ADLs: Capable Fall risk: None Home safety: Good Cognitive evaluation: Intact to orientation, naming, recall and repetition EOL planning: Adv directives, full code/ I agree  Preventative Medicine: Flu shot today. Tetanus and pneumovax UTD. He declines PSA screening today. Colon screening UTD. Encouraged him to consume a balanced diet and exercise regimen. Advised him to see an eye doctor and dentist annually. He has had multiple labs recently and declines further labs today.   Next appointment: 6 months, follow up chronic conditions   Webb Silversmith, NP

## 2018-01-08 NOTE — Progress Notes (Signed)
IV and telemetry discontinued. CCMD notified. Discharge instructions reviewed with patient. All questions answered.   Emelda Fear, RN

## 2018-01-08 NOTE — Progress Notes (Addendum)
Progress Note  Patient Name: Joshua [name] Date of Encounter: 01/08/2018  Primary Cardiologist: Candee Furbish, MD  PV: Dr. Fletcher [name]  Subjective   Doing great post PV intervention. Denies leg pain, states his rt leg is more warm. No longer cold. Brachial access site is stable.   Inpatient Medications    Scheduled Meds: . amLODipine  5 mg Oral Daily  . aspirin EC  81 mg Oral Daily  . atorvastatin  40 mg Oral q1800  . clopidogrel  75 mg Oral Daily  . diphenhydrAMINE  25 mg Oral Once  . fluticasone furoate-vilanterol  1 puff Inhalation Daily   And  . umeclidinium bromide  1 puff Inhalation Daily  . loratadine  10 mg Oral Daily  . omega-3 acid ethyl esters  1 g Oral BID  . pantoprazole  40 mg Oral Daily  . predniSONE  20 mg Oral Q breakfast  . sodium chloride flush  3 mL Intravenous Q12H  . traZODone  100 mg Oral QHS   Continuous Infusions: . sodium chloride     PRN Meds: sodium chloride, acetaminophen, albuterol, hydrALAZINE, labetalol, nitroGLYCERIN, ondansetron (ZOFRAN) IV, oxyCODONE, sodium chloride flush   Vital Signs    Vitals:   01/08/18 0700 01/08/18 0800 01/08/18 0818 01/08/18 0853  BP:    (!) 162/72  Pulse:      Resp: 13 19  (!) 24  Temp:    98.6 F (37 C)  TempSrc:    Oral  SpO2:   98%   Weight:      Height:        Intake/Output Summary (Last 24 hours) at 01/08/2018 1059 Last data filed at 01/07/2018 2200 Gross per 24 hour  Intake 1113.17 ml  Output 350 ml  Net 763.17 ml   Filed Weights   01/07/18 0629 01/08/18 0508  Weight: 90.7 kg 89.9 kg    Telemetry     NSR, no arrhthymias - Personally Reviewed  ECG     not performed.- Personally Reviewed  Physical Exam   GEN: No acute distress.   Neck: No JVD Cardiac: RRR, no murmurs, rubs, or gallops.  Respiratory: mild expiratory wheezing (smoker). GI: Soft, nontender, non-distended  MS: No edema; No deformity. Left brachial cath access site is stable.  Neuro:  Nonfocal  Psych: Normal  affect   Labs    Chemistry Recent Labs  Lab 01/02/18 0927  NA 139  K 4.0  CL 97  CO2 27  GLUCOSE 75  BUN 12  CREATININE 0.66*  CALCIUM 9.7  GFRNONAA 104  GFRAA 121     Hematology Recent Labs  Lab 01/02/18 0927  WBC 16.7*  RBC 4.79  HGB 16.4  HCT 46.2  MCV 97  MCH 34.2*  MCHC 35.5  RDW 13.9  PLT 269    Cardiac EnzymesNo results for input(s): TROPONINI in the last 168 hours. No results for input(s): TROPIPOC in the last 168 hours.   BNPNo results for input(s): BNP, PROBNP in the last 168 hours.   DDimer No results for input(s): DDIMER in the last 168 hours.   Radiology    No results found.  Cardiac Studies   abdominal aortogram with lower extremity runoff 01/07/18 Conclusion   1.  Widely patent aortobifemoral bypass with no obstructive disease. 2.  Right lower extremity: Severe calcified stenosis affecting the ostial SFA with moderate calcified disease in the mid and distal segment, three-vessel runoff below the knee. 3.  Left lower extremity: 60% ostial stenosis of the  SFA with no other obstructive disease. 4.  Successful orbital atherectomy and drug-coated balloon angioplasty to the ostial and proximal right SFA.  Recommendations: The patient is already on dual antiplatelet therapy. Continue aggressive treatment of risk factors. Due to brachial artery access, monitor overnight and likely discharge home tomorrow.     Patient Profile     62 y.o. male with known history of coronary artery disease status post rotational atherectomy and drug-eluting stent placement to the mid LAD, peripheral arterial disease status post aortobifemoral bypass in 2010 at [facility], diabetes mellitus with diabetic neuropathy, hyperlipidemia and tobacco use admitted for elective abdominal aortogram with lower extremity runoff, due to worsening claudication and concern for critical limb ischemia.   Assessment & Plan    1. PVD: LE PV angio 10/23 showed widely patent aortobifemoral  bypass with no obstructive disease. Right lower extremity: Severe calcified stenosis affecting the ostial SFA with moderate calcified disease in the mid and distal segment, three-vessel runoff below the knee. Left lower extremity: 60% ostial stenosis of the SFA with no other obstructive disease. He underwent successful orbital atherectomy and drug-coated balloon angioplasty to the ostial and proximal right SFA. Doing well post intervention. Lower extremities feeling good. No pain. Palpable pedal pulses. Left brachial artery access site is stable. He will continue on DAPT w/ ASA and Plavix, in addition to statin therapy. Smoking cessation strongly advised.   2. CAD: Status post atherectomy and drug-eluting stent placement to the LAD. Denies angina. Continue medical management.   3. Tobacco Use: given his CAD and PVD, smoking cessation strongly advised.   4. HTN: continue home regimen.   5. HLD: on statin therapy w/ Lipitor 40 mg. LDL checked last month and at goal at 70 mg/dL. He may benefit from more aggressive lipid reduction given his history of CAD and PVD. Consider increasing to 80 mg for secondary prevention of obstructive vascular dz. Will defer to primary cardiologist. This can be followed outpatient.    For questions or updates, please contact [place] Please consult [email or web address] for contact info under        Signed, Lyda Jester, PA-C  01/08/2018, 10:59 AM    Personally seen and examined. Agree with above.  Leg feels better.  Warm.  Brachial artery site mildly protuberant but excellent pulse, no bruits heard.  Lungs with mild wheeze.  Okay with discharge.  Please see discharge summary for full details.  Dual antiplatelet therapy.  Right SFA intervention.  Balloon angioplasty.  Candee Furbish, MD

## 2018-01-11 ENCOUNTER — Encounter: Payer: Self-pay | Admitting: Internal Medicine

## 2018-01-11 DIAGNOSIS — E785 Hyperlipidemia, unspecified: Secondary | ICD-10-CM | POA: Insufficient documentation

## 2018-01-11 DIAGNOSIS — M1990 Unspecified osteoarthritis, unspecified site: Secondary | ICD-10-CM | POA: Insufficient documentation

## 2018-01-11 DIAGNOSIS — M109 Gout, unspecified: Secondary | ICD-10-CM | POA: Insufficient documentation

## 2018-01-11 DIAGNOSIS — E119 Type 2 diabetes mellitus without complications: Secondary | ICD-10-CM | POA: Insufficient documentation

## 2018-01-11 NOTE — Patient Instructions (Signed)

## 2018-01-11 NOTE — Assessment & Plan Note (Signed)
Controlled on Amlodipine Reinforced DASH diet and exercise for weight loss

## 2018-01-11 NOTE — Assessment & Plan Note (Signed)
Controlled on Fish Oil and Atorvastatin Encouraged him to consume a low fat diet

## 2018-01-11 NOTE — Assessment & Plan Note (Signed)
Continue Atorvastatin and Plavix Encouraged smoking cessation, he declines

## 2018-01-11 NOTE — Assessment & Plan Note (Signed)
He declines A1C and microalbumin today Encouraged him to consume a low carb diet and exercise for weight loss Encouraged yearly eye exam Foot exam today Flu and pneumovax UTD

## 2018-01-11 NOTE — Assessment & Plan Note (Signed)
Continue Trelegy and Albuterol Encouraged smoking cessation- he declines He will continue to follow with pulmonology

## 2018-01-11 NOTE — Assessment & Plan Note (Signed)
No angina Continue Fish Oil, Plavix and Atorvastatin He will continue to follow with cardiology

## 2018-01-11 NOTE — Assessment & Plan Note (Signed)
Stable on Trazadone Will monitor

## 2018-01-11 NOTE — Assessment & Plan Note (Signed)
Continue Lyrica for pain control Encouraged regular activity

## 2018-01-11 NOTE — Assessment & Plan Note (Signed)
Continue Colchicine prn

## 2018-01-11 NOTE — Assessment & Plan Note (Signed)
Controlled on Omeprazole Discussed how weight loss could help improve symptoms

## 2018-01-12 ENCOUNTER — Other Ambulatory Visit: Payer: Self-pay | Admitting: Acute Care

## 2018-01-12 ENCOUNTER — Ambulatory Visit (INDEPENDENT_AMBULATORY_CARE_PROVIDER_SITE_OTHER): Payer: Medicare Other | Admitting: Acute Care

## 2018-01-12 ENCOUNTER — Encounter: Payer: Self-pay | Admitting: Acute Care

## 2018-01-12 ENCOUNTER — Ambulatory Visit (INDEPENDENT_AMBULATORY_CARE_PROVIDER_SITE_OTHER)
Admission: RE | Admit: 2018-01-12 | Discharge: 2018-01-12 | Disposition: A | Discharge status: Home / Self Care | Payer: Medicare Other | Source: Ambulatory Visit | Attending: Acute Care | Admitting: Acute Care

## 2018-01-12 DIAGNOSIS — Z122 Encounter for screening for malignant neoplasm of respiratory organs: Secondary | ICD-10-CM

## 2018-01-12 DIAGNOSIS — Z87891 Personal history of nicotine dependence: Secondary | ICD-10-CM | POA: Diagnosis not present

## 2018-01-12 DIAGNOSIS — F17210 Nicotine dependence, cigarettes, uncomplicated: Secondary | ICD-10-CM | POA: Diagnosis not present

## 2018-01-12 NOTE — Progress Notes (Signed)
Shared Decision Making Visit Lung Cancer Screening Program [phone])   Eligibility:  Age 62 y.o.  Pack Years Smoking History Calculation 36 pack year smoking history (# packs/per year x # years smoked)  Recent History of coughing up blood  no  Unexplained weight loss? no ( >Than 15 pounds within the last 6 months )  Prior History Lung / other cancer no (Diagnosis within the last 5 years already requiring surveillance chest CT Scans).  Smoking Status Current Smoker  Former Smokers: Years since quit: NA  Quit Date: NA  Visit Components:  Discussion included one or more decision making aids. yes  Discussion included risk/benefits of screening. yes  Discussion included potential follow up diagnostic testing for abnormal scans. yes  Discussion included meaning and risk of over diagnosis. yes  Discussion included meaning and risk of False Positives. yes  Discussion included meaning of total radiation exposure. yes  Counseling Included:  Importance of adherence to annual lung cancer LDCT screening. yes  Impact of comorbidities on ability to participate in the program. yes  Ability and willingness to under diagnostic treatment. yes  Smoking Cessation Counseling:  Current Smokers:   Discussed importance of smoking cessation. yes  Information about tobacco cessation classes and interventions provided to patient. yes  Patient provided with "ticket" for LDCT Scan. yes  Symptomatic Patient. no  Counseling  Diagnosis Code: Tobacco Use Z72.0  Asymptomatic Patient yes  Counseling (Intermediate counseling: > three minutes counseling) B2620  Former Smokers:   Discussed the importance of maintaining cigarette abstinence. yes  Diagnosis Code: Personal History of Nicotine Dependence. B55.974  Information about tobacco cessation classes and interventions provided to patient. Yes  Patient provided with "ticket" for LDCT Scan. yes  Written Order for Lung Cancer  Screening with LDCT placed in Epic. Yes (CT Chest Lung Cancer Screening Low Dose W/O CM) [id number] Z12.2-Screening of respiratory organs Z87.891-Personal history of nicotine dependence  I have spent 25 minutes of face to face time with Mr. Westmoreland  discussing the risks and benefits of lung cancer screening. We viewed a power point together that explained in detail the above noted topics. We paused at intervals to allow for questions to be asked and answered to ensure understanding.We discussed that the single most powerful action that he can take to decrease his risk of developing lung cancer is to quit smoking. We discussed whether or not he is ready to commit to setting a quit date. We discussed options for tools to aid in quitting smoking including nicotine replacement therapy, non-nicotine medications, support groups, Quit Smart classes, and behavior modification. We discussed that often times setting smaller, more achievable goals, such as eliminating 1 cigarette a day for a week and then 2 cigarettes a day for a week can be helpful in slowly decreasing the number of cigarettes smoked. This allows for a sense of accomplishment as well as providing a clinical benefit. I gave him the " Be Stronger Than Your Excuses" card with contact information for community resources, classes, free nicotine replacement therapy, and access to mobile apps, text messaging, and on-line smoking cessation help. I have also given him my card and contact information in the event he needs to contact me. We discussed the time and location of the scan, and that either Doroteo Glassman RN or I will call with the results within 24-48 hours of receiving them. I have offered him  a copy of the power point we viewed  as a resource in the event they need reinforcement  of the concepts we discussed today in the office. The patient verbalized understanding of all of  the above and had no further questions upon leaving the office. They have my  contact information in the event they have any further questions.  I spent 5+ minutes counseling on smoking cessation and the health risks of continued tobacco abuse.  I explained to the patient that there has been a high incidence of coronary artery disease noted on these exams. I explained that this is a non-gated exam therefore degree or severity cannot be determined. This patient is on statin therapy. I have asked the patient to follow-up with their PCP regarding any incidental finding of coronary artery disease and management with diet or medication as their PCP  feels is clinically indicated. He states he is followed by cardiology.The patient verbalized understanding of the above and had no further questions upon completion of the visit.      Magdalen Spatz, NP 01/12/2018 4:11 PM

## 2018-01-13 ENCOUNTER — Telehealth: Payer: Self-pay | Admitting: *Deleted

## 2018-01-13 NOTE — Telephone Encounter (Signed)
Joshua [name] from [facility] regarding the CT results, specifically :           "3.3 x 2.7 cm irregular thin-walled cavitary lesion in the right upper lobe (series 3/image 143), previously 2.6 x 2.5 cm in 2018, new from 2014. This is difficult to measure using the DynaCAD software but is considered at least a 4A lesion."  Will Route to [place] NP for follow up

## 2018-01-15 ENCOUNTER — Telehealth: Payer: Self-pay | Admitting: Pulmonary Disease

## 2018-01-15 DIAGNOSIS — Z122 Encounter for screening for malignant neoplasm of respiratory organs: Secondary | ICD-10-CM

## 2018-01-15 DIAGNOSIS — F17210 Nicotine dependence, cigarettes, uncomplicated: Secondary | ICD-10-CM

## 2018-01-15 NOTE — Telephone Encounter (Signed)
See result note from Eric Form, NP 01/15/18.

## 2018-01-15 NOTE — Telephone Encounter (Signed)
SG please advise for CT results

## 2018-01-21 ENCOUNTER — Ambulatory Visit (HOSPITAL_COMMUNITY)
Admission: RE | Admit: 2018-01-21 | Discharge: 2018-01-21 | Disposition: A | Discharge status: Home / Self Care | Payer: Medicare Other | Source: Ambulatory Visit | Attending: Cardiovascular Disease | Admitting: Cardiovascular Disease

## 2018-01-21 DIAGNOSIS — I739 Peripheral vascular disease, unspecified: Secondary | ICD-10-CM | POA: Insufficient documentation

## 2018-01-27 ENCOUNTER — Ambulatory Visit (INDEPENDENT_AMBULATORY_CARE_PROVIDER_SITE_OTHER): Payer: Medicare Other | Admitting: Cardiovascular Disease

## 2018-01-27 ENCOUNTER — Encounter: Payer: Self-pay | Admitting: Cardiovascular Disease

## 2018-01-27 VITALS — BP 128/84 | HR 69 | Ht 69.0 in | Wt 207.4 lb

## 2018-01-27 DIAGNOSIS — Z720 Tobacco use: Secondary | ICD-10-CM

## 2018-01-27 DIAGNOSIS — E785 Hyperlipidemia, unspecified: Secondary | ICD-10-CM

## 2018-01-27 DIAGNOSIS — I10 Essential (primary) hypertension: Secondary | ICD-10-CM

## 2018-01-27 DIAGNOSIS — I2510 Atherosclerotic heart disease of native coronary artery without angina pectoris: Secondary | ICD-10-CM | POA: Diagnosis not present

## 2018-01-27 DIAGNOSIS — I739 Peripheral vascular disease, unspecified: Secondary | ICD-10-CM

## 2018-01-27 NOTE — Patient Instructions (Signed)
Medication Instructions:  Your physician recommends that you continue on your current medications as directed. Please refer to the Current Medication list given to you today. If you need a refill on your cardiac medications before your next appointment, please call your pharmacy.   Lab work: none If you have labs (blood work) drawn today and your tests are completely normal, you will receive your results only by: [name] MyChart Message (if you have MyChart) OR . A paper copy in the mail If you have any lab test that is abnormal or we need to change your treatment, we will call you to review the results.  Testing/Procedures: Your physician has requested that you have a lower extremity arterial exercise duplex. During this test, exercise and ultrasound are used to evaluate arterial blood flow in the legs. Allow one hour for this exam. There are no restrictions or special instructions. Your physician has requested that you have an ankle brachial index (ABI). During this test an ultrasound and blood pressure cuff are used to evaluate the arteries that supply the arms and legs with blood. Allow thirty minutes for this exam. There are no restrictions or special instructions.    Follow-Up: At [facility], you and your health needs are our priority.  As part of our continuing mission to provide you with exceptional heart care, we have created designated Provider Care Teams.  These Care Teams include your primary Cardiologist (physician) and Advanced Practice Providers (APPs -  Physician Assistants and Nurse Practitioners) who all work together to provide you with the care you need, when you need it. You will need a follow up appointment in 6 months schedule AFTER 6 month LEA/ABI.  Please call our office 2 months in advance to schedule this appointment.  You may see Dr. Fletcher [name] - PV or one of the following Advanced Practice Providers on your designated Care Team:   Kerin Ransom, PA-C Roby Lofts,  [place] . Sande Rives, PA-C  Any Other Special Instructions Will Be Listed Below (If Applicable).

## 2018-01-27 NOTE — Progress Notes (Signed)
Cardiology Office Note   Date:  01/27/2018   ID:  [name], DOB [birth year], MRN [id number]  PCP:  Joshua Fenton, NP  Cardiologist:  Dr. Marlou Porch  Chief Complaint  Patient presents with  . Follow-up    POST Doppler      History of Present Illness: Joshua [name] is a 62 y.o. male who is here today for a follow up visit regarding peripheral arterial disease. He has known history of coronary artery disease status post rotational atherectomy and drug-eluting stent placement to the mid LAD. He has known history of peripheral arterial disease status post aortobifemoral bypass in 2010 at [place], diabetes mellitus with diabetic neuropathy, hyperlipidemia and tobacco use. The patient does not have left carotid bruit but carotid Doppler showed less than 40% stenosis. He was seen recently for worsening right leg claudication.  Vascular studies showed a drop in ABI to 0.75 on the right side which was a new finding. I proceeded with angiography last month which showed widely patent aortobifemoral bypass.  There was severe calcified stenosis affecting the ostial SFA with moderate calcified disease in the mid and distal segment and three-vessel runoff below the knee.  On the left side, there was 60% ostial stenosis of the left SFA which was stable from before.  I performed orbital atherectomy and drug-coated balloon angioplasty to the ostial and proximal right SFA via the left brachial artery. He reports resolution of right leg claudication although he continues to have neuropathic pain.  He denies any chest pain or shortness of breath.  He takes his medications regularly.  Past Medical History:  Diagnosis Date  . AC (acromioclavicular) joint bone spurs, unspecified laterality 11/25/2016  . Arthritis    "back" (05/16/2015)  . CAD in native artery, occluded Lcx and rotational atherectomy to LAD with DES 05/17/15 05/18/2015  . Chronic back pain    "all my back"  . Chronic bronchitis ([place])    . COPD (chronic obstructive pulmonary disease) ([place])   . GERD (gastroesophageal reflux disease)   . H/O blood clots    "had them in my back; put filter in before one of my neck ORs"  . History of gout   . Hyperlipidemia   . Hypertension   . PAD (peripheral artery disease) ([place])   . Phlebitis   . Positive TB test   . Seasonal allergies   . Shortness of breath   . Spinal disease     Past Surgical History:  Procedure Laterality Date  . ABDOMINAL AORTOGRAM W/LOWER EXTREMITY  01/07/2018  . ABDOMINAL AORTOGRAM W/LOWER EXTREMITY N/A 01/07/2018   Procedure: ABDOMINAL AORTOGRAM W/LOWER EXTREMITY;  Surgeon: Wellington Hampshire, MD;  Location: [place];  Service: Cardiovascular;  Laterality: N/A;  . ANTERIOR CERVICAL DECOMP/DISCECTOMY FUSION  ~ 2001-2009 X 3  . ANTERIOR CERVICAL DECOMP/DISCECTOMY FUSION N/A 10/06/2012   Procedure: ANTERIOR CERVICAL DECOMPRESSION/DISCECTOMY FUSION 1 LEVEL;  Surgeon: [name], MD;  Location: [facility];  Service: Neurosurgery;  Laterality: N/A;  C7T1 anterior cervical decompression with fusion plating and bonegraft   . BACK SURGERY    . CARDIAC CATHETERIZATION  05/16/2015  . CARDIAC CATHETERIZATION N/A 05/16/2015   Procedure: Left Heart Cath and Coronary Angiography;  Surgeon: [name], MD;  Location: [facility];  Service: Cardiovascular;  Laterality: N/A;  . CARDIAC CATHETERIZATION N/A 05/16/2015   Procedure: Coronary Balloon Angioplasty;  Surgeon: [name], MD;  Location: [place] [facility];  Service: Cardiovascular;  Laterality: N/A;  . CARDIAC CATHETERIZATION N/A 05/17/2015   Procedure: Coronary Stent Intervention Rotoblater;  Surgeon: [name], MD;  Location: [facility] [facility];  Service: Cardiovascular;  Laterality: N/A;  . ELBOW SURGERY Left 2016   "had bone pieces removed"  . ESOPHAGEAL MANOMETRY N/A 12/30/2016   Procedure: ESOPHAGEAL MANOMETRY (EM);  Surgeon: Irene Shipper, MD;  Location: [facility];  Service:  Endoscopy;  Laterality: N/A;  . EXCISIONAL HEMORRHOIDECTOMY    . FEMORAL BYPASS  ~ 2010  . IR RADIOLOGIST EVAL & MGMT  12/17/2016  . IVC FILTER PLACEMENT ([place] HX)  2010   "had blood clots in my back"  . PERIPHERAL VASCULAR ATHERECTOMY  01/07/2018   Procedure: PERIPHERAL VASCULAR ATHERECTOMY;  Surgeon: Wellington Hampshire, MD;  Location: [place];  Service: Cardiovascular;;  . PERIPHERAL VASCULAR BALLOON ANGIOPLASTY  01/07/2018   Procedure: PERIPHERAL VASCULAR BALLOON ANGIOPLASTY;  Surgeon: Wellington Hampshire, MD;  Location: [facility];  Service: Cardiovascular;;  . PERIPHERAL VASCULAR CATHETERIZATION N/A 02/14/2016   Procedure: Abdominal Aortogram w/Lower Extremity;  Surgeon: Wellington Hampshire, MD;  Location: [facility];  Service: Cardiovascular;  Laterality: N/A;  . POSTERIOR LAMINECTOMY / DECOMPRESSION LUMBAR SPINE  2006   "bone spurs"     Current Outpatient Medications  Medication Sig Dispense Refill  . albuterol (PROVENTIL) (2.5 MG/3ML) 0.083% nebulizer solution Take 3 mLs (2.5 mg total) by nebulization every 6 (six) hours as needed for wheezing or shortness of breath. 75 mL 5  . amLODipine (NORVASC) 5 MG tablet Take 1 tablet (5 mg total) by mouth daily. 90 tablet 1  . aspirin EC 81 MG tablet Take 81 mg by mouth daily.    [name] atorvastatin (LIPITOR) 40 MG tablet Take 1 tablet (40 mg total) by mouth daily at 6 PM. 90 tablet 1  . Blood Glucose Monitoring Suppl (ONETOUCH VERIO) w/Device KIT 1 Device by Does not apply route once. 1 kit 0  . clopidogrel (PLAVIX) 75 MG tablet Take 1 tablet (75 mg total) by mouth daily. 90 tablet 3  . fexofenadine (ALLEGRA ALLERGY) 180 MG tablet Take 1 tablet (180 mg total) by mouth daily. 90 tablet 3  . Fluticasone-Umeclidin-Vilant (TRELEGY ELLIPTA) 100-62.5-25 MCG/INH AEPB Inhale 1 puff into the lungs daily. (Patient taking differently: Inhale 1 puff into the lungs 2 (two) times daily. ) 28 each 5  . nitroGLYCERIN (NITROSTAT) 0.4 MG SL  tablet Place 1 tablet (0.4 mg total) under the tongue every 5 (five) minutes as needed for chest pain. 25 tablet 4  . Omega-3 Fatty Acids (FISH OIL) 1200 MG CAPS Take 1,200 mg by mouth daily.     [name] omeprazole (PRILOSEC) 40 MG capsule Take 1 capsule (40 mg total) by mouth daily. 90 capsule 3  . ONE TOUCH LANCETS MISC 1 each by Does not apply route 3 (three) times daily. 200 each 5  . ONETOUCH VERIO test strip USE TO TEST 3 TIMES DAILY 100 each 1  . OVER THE COUNTER MEDICATION Apply 1 application topically daily as needed (neuropathy pain). Hemp oil cream    . PROAIR HFA 108 (90 Base) MCG/ACT inhaler USE 2 PUFFS EVERY 6 HOURS  AS NEEDED FOR WHEEZING OR  SHORTNESS OF BREATH (Patient taking differently: Inhale 2 puffs into the lungs every 6 (six) hours as needed for wheezing or shortness of breath. ) 34 g 3  . traZODone (DESYREL) 100 MG tablet Take 1 tablet (100 mg total) by mouth at bedtime. (Patient  taking differently: Take 100 mg by mouth at bedtime as needed for sleep. ) 90 tablet 1  . diphenhydrAMINE (BENADRYL) 50 MG tablet Take 0.5 tablets (25 mg total) by mouth once. 1 hr prior to CT scan 30 tablet 0   No current facility-administered medications for this visit.     Allergies:   Lisinopril; Amitriptyline; Contrast media [iodinated diagnostic agents]; and Ibuprofen    Social History:  The patient  reports that he has been smoking cigarettes. He has a 36.75 pack-year smoking history. He has never used smokeless tobacco. He reports that he drinks alcohol. He reports that he does not use drugs.   Family History:  The patient's family history includes Aneurysm in his brother; Healthy in his daughter and maternal grandmother; Heart attack in his father; Hypertension in his father and mother; Leukemia in his grandchild; Stomach cancer in his mother; Stroke in his father.    ROS:  Please see the history of present illness.   Otherwise, review of systems are positive for none.   All other systems are  reviewed and negative.    PHYSICAL EXAM: VS:  BP 128/84   Pulse 69   Ht '5\' 9"'  (1.753 m)   Wt 207 lb 6.4 oz (94.1 kg)   BMI 30.63 kg/m  , BMI Body mass index is 30.63 kg/m. GEN: Well nourished, well developed, in no acute distress  HEENT: normal  Neck: no JVD, carotid bruits, or masses Cardiac: RRR; no murmurs, rubs, or gallops,no edema  Respiratory: Bilateral rhonchi and mild expiratory wheezing. GI: soft, nontender, nondistended, + BS MS: no deformity or atrophy  Skin: warm and dry, no rash Neuro:  Strength and sensation are intact Psych: euthymic mood, full affect Vascular: Femoral pulses are normal bilaterally.  Left brachial pulses normal with no hematoma.  EKG:  EKG is not ordered today.  Recent Labs: 12/12/2017: ALT 103 01/02/2018: BUN 12; Creatinine, Ser 0.66; Hemoglobin 16.4; Platelets 269; Potassium 4.0; Sodium 139    Lipid Panel    Component Value Date/Time   CHOL 215 (H) 11/21/2017 0810   TRIG 94 11/21/2017 0810   HDL 126 11/21/2017 0810   CHOLHDL 1.7 11/21/2017 0810   CHOLHDL 3 10/14/2016 1440   VLDL 25.0 10/14/2016 1440   LDLCALC 70 11/21/2017 0810   LDLDIRECT 107.0 04/21/2015 1352      Wt Readings from Last 3 Encounters:  01/27/18 207 lb 6.4 oz (94.1 kg)  01/08/18 206 lb (93.4 kg)  01/08/18 198 lb 3.2 oz (89.9 kg)         ASSESSMENT AND PLAN:  1.   Peripheral arterial disease: Status post aortobifemoral bypass .  Status post recent endovascular intervention to the right ostial and proximal SFA with excellent results.  Postprocedure ABI showed improvement in ABI to normal.  He continues to have moderate mid to distal SFA disease which will be treated medically for now.  He is already on dual antiplatelet therapy.  Repeat vascular studies in 6 months.    2. Coronary artery disease: Status post atherectomy and drug-eluting stent placement to the LAD.  The patient has no angina.  Continue dual antiplatelet therapy.  3. Tobacco use: He is trying to  cut down on his own.  I discussed with him the importance of smoking cessation.  4. Hyperlipidemia: The dose of atorvastatin was decreased recently due to abnormal liver enzymes.  5.  Excessive alcohol use: The patient quit drinking a few months ago.  6.  Essential hypertension: Blood pressure  is well controlled.    Disposition:   FU with me in 6 months  Signed,  Kathlyn Sacramento, MD  01/27/2018 10:12 AM    [place]

## 2018-01-30 DIAGNOSIS — J449 Chronic obstructive pulmonary disease, unspecified: Secondary | ICD-10-CM | POA: Diagnosis not present

## 2018-02-05 ENCOUNTER — Ambulatory Visit: Payer: Medicare Other | Admitting: Pulmonary Disease

## 2018-02-05 ENCOUNTER — Ambulatory Visit (INDEPENDENT_AMBULATORY_CARE_PROVIDER_SITE_OTHER): Payer: Medicare Other | Admitting: Pulmonary Disease

## 2018-02-05 ENCOUNTER — Encounter: Payer: Self-pay | Admitting: Pulmonary Disease

## 2018-02-05 VITALS — BP 158/80 | HR 73 | Ht 69.0 in | Wt 209.0 lb

## 2018-02-05 DIAGNOSIS — J984 Other disorders of lung: Secondary | ICD-10-CM | POA: Diagnosis not present

## 2018-02-05 DIAGNOSIS — J449 Chronic obstructive pulmonary disease, unspecified: Secondary | ICD-10-CM | POA: Diagnosis not present

## 2018-02-05 DIAGNOSIS — R0602 Shortness of breath: Secondary | ICD-10-CM | POA: Diagnosis not present

## 2018-02-05 DIAGNOSIS — R0609 Other forms of dyspnea: Secondary | ICD-10-CM | POA: Diagnosis not present

## 2018-02-05 LAB — PULMONARY FUNCTION TEST
DL/VA % PRED: 82 %
DL/VA: 3.75 ml/min/mmHg/L
DLCO unc % pred: 75 %
DLCO unc: 23.38 ml/min/mmHg
FEF 25-75 Post: 1.29 L/sec
FEF 25-75 Pre: 0.76 L/sec
FEF2575-%Change-Post: 68 %
FEF2575-%PRED-POST: 45 %
FEF2575-%Pred-Pre: 26 %
FEV1-%CHANGE-POST: 18 %
FEV1-%Pred-Post: 60 %
FEV1-%Pred-Pre: 50 %
FEV1-PRE: 1.75 L
FEV1-Post: 2.09 L
FEV1FVC-%Change-Post: 9 %
FEV1FVC-%Pred-Pre: 76 %
FEV6-%Change-Post: 9 %
FEV6-%PRED-POST: 74 %
FEV6-%PRED-PRE: 68 %
FEV6-POST: 3.24 L
FEV6-PRE: 2.97 L
FEV6FVC-%CHANGE-POST: 0 %
FEV6FVC-%PRED-POST: 102 %
FEV6FVC-%PRED-PRE: 102 %
FVC-%Change-Post: 8 %
FVC-%Pred-Post: 72 %
FVC-%Pred-Pre: 66 %
FVC-Post: 3.3 L
FVC-Pre: 3.05 L
POST FEV6/FVC RATIO: 98 %
Post FEV1/FVC ratio: 63 %
Pre FEV1/FVC ratio: 58 %
Pre FEV6/FVC Ratio: 98 %
RV % PRED: 173 %
RV: 3.82 L
TLC % PRED: 111 %
TLC: 7.59 L

## 2018-02-05 MED ORDER — TIOTROPIUM BROMIDE-OLODATEROL 2.5-2.5 MCG/ACT IN AERS: 2.0000 | INHALATION_SPRAY | Freq: Every day | RESPIRATORY_TRACT | 0 refills | Status: DC

## 2018-02-05 MED ORDER — FLUTICASONE-UMECLIDIN-VILANT 100-62.5-25 MCG/INH IN AEPB: 1.0000 | INHALATION_SPRAY | Freq: Every day | RESPIRATORY_TRACT | 0 refills | Status: DC

## 2018-02-05 NOTE — H&P (View-Only) (Signed)
Synopsis: Referred in Oct. 2019 for COPD by Jearld Fenton, NP  Subjective:   PATIENT ID: Joshua [name] GENDER: male DOB: 01-Nov-1955, MRN: [id number]  Chief Complaint  Patient presents with  . Follow-up    PFT States he is having increased trouble bending over with increased mucous production.     PMH of tobacco abuse, smoked since age of 72, at heaviest was 1ppd and now down to 0.5ppd. History of back surgery complicated by DVT. He has planned intervention by cardiology for his PVD on his right leg in two weeks.  He was treated with antibiotics and steroids recently by his primary care provider.  He is recently at the end of his steroid taper.  Presents to the office today with significant dyspnea on exertion, congestion, cough, sputum production as well as chest tightness and wheezing.  He has difficulty getting up and walking from his chair to the bathroom.  He states that he received a breathing treatment by his primary care doctor and felt much better for several hours after that.  He is still continuing to smoke and he knows that he needs to quit but he has been unable to stop.  He has been incarcerated x2 each time for a little less than a year.  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  He denies IV drug use history.  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  OV 02/05/2018: Since the patient was last seen in the office he has been doing much better.  He has started using his trilogy inhaler regularly.  He has cut down smoking.  He is only smoking 3 to 4 cigarettes/day.  He is definitely trying to quit.  Since he was last seen in the office we also completed a low-dose lung cancer screening CT which was read as a lung RADS for a.  This had a enlarging right upper lobe cavitary/cystic lesion with areas of associated groundglass and semisolid material.  He does have dyspnea on exertion.  His daily cough is somewhat better.  He still has sputum  production.    Past Medical History:  Diagnosis Date  . AC (acromioclavicular) joint bone spurs, unspecified laterality 11/25/2016  . Arthritis    "back" (05/16/2015)  . CAD in native artery, occluded Lcx and rotational atherectomy to LAD with DES 05/17/15 05/18/2015  . Chronic back pain    "all my back"  . Chronic bronchitis ([place])   . COPD (chronic obstructive pulmonary disease) ([place])   . GERD (gastroesophageal reflux disease)   . H/O blood clots    "had them in my back; put filter in before one of my neck ORs"  . History of gout   . Hyperlipidemia   . Hypertension   . PAD (peripheral artery disease) ([place])   . Phlebitis   . Positive TB test   . Seasonal allergies   . Shortness of breath   . Spinal disease      Family History  Problem Relation Age of Onset  . Stomach cancer Mother        Deceased, 24s  . Hypertension Mother   . Hypertension Father   . Stroke Father   . Heart attack Father        Deceased, 62  . Aneurysm Brother   . Healthy Daughter   . Healthy Maternal Grandmother   . Leukemia Grandchild   . Diabetes Neg Hx   .  Early death Neg Hx   . Colon cancer Neg Hx   . Pancreatic cancer Neg Hx      Past Surgical History:  Procedure Laterality Date  . ABDOMINAL AORTOGRAM W/LOWER EXTREMITY  01/07/2018  . ABDOMINAL AORTOGRAM W/LOWER EXTREMITY N/A 01/07/2018   Procedure: ABDOMINAL AORTOGRAM W/LOWER EXTREMITY;  Surgeon: Wellington Hampshire, MD;  Location: [facility];  Service: Cardiovascular;  Laterality: N/A;  . ANTERIOR CERVICAL DECOMP/DISCECTOMY FUSION  ~ 2001-2009 X 3  . ANTERIOR CERVICAL DECOMP/DISCECTOMY FUSION N/A 10/06/2012   Procedure: ANTERIOR CERVICAL DECOMPRESSION/DISCECTOMY FUSION 1 LEVEL;  Surgeon: [name], MD;  Location: [facility];  Service: Neurosurgery;  Laterality: N/A;  C7T1 anterior cervical decompression with fusion plating and bonegraft   . BACK SURGERY    . CARDIAC CATHETERIZATION  05/16/2015  . CARDIAC CATHETERIZATION N/A  05/16/2015   Procedure: Left Heart Cath and Coronary Angiography;  Surgeon: [name], MD;  Location: [facility];  Service: Cardiovascular;  Laterality: N/A;  . CARDIAC CATHETERIZATION N/A 05/16/2015   Procedure: Coronary Balloon Angioplasty;  Surgeon: [name], MD;  Location: [facility];  Service: Cardiovascular;  Laterality: N/A;  . CARDIAC CATHETERIZATION N/A 05/17/2015   Procedure: Coronary Stent Intervention Rotoblater;  Surgeon: [name], MD;  Location: [facility];  Service: Cardiovascular;  Laterality: N/A;  . ELBOW SURGERY Left 2016   "had bone pieces removed"  . ESOPHAGEAL MANOMETRY N/A 12/30/2016   Procedure: ESOPHAGEAL MANOMETRY (EM);  Surgeon: Irene Shipper, MD;  Location: [facility];  Service: Endoscopy;  Laterality: N/A;  . EXCISIONAL HEMORRHOIDECTOMY    . FEMORAL BYPASS  ~ 2010  . IR RADIOLOGIST EVAL & MGMT  12/17/2016  . IVC FILTER PLACEMENT ([facility])  2010   "had blood clots in my back"  . PERIPHERAL VASCULAR ATHERECTOMY  01/07/2018   Procedure: PERIPHERAL VASCULAR ATHERECTOMY;  Surgeon: Wellington Hampshire, MD;  Location: [facility];  Service: Cardiovascular;;  . PERIPHERAL VASCULAR BALLOON ANGIOPLASTY  01/07/2018   Procedure: PERIPHERAL VASCULAR BALLOON ANGIOPLASTY;  Surgeon: Wellington Hampshire, MD;  Location: [facility];  Service: Cardiovascular;;  . PERIPHERAL VASCULAR CATHETERIZATION N/A 02/14/2016   Procedure: Abdominal Aortogram w/Lower Extremity;  Surgeon: Wellington Hampshire, MD;  Location: [facility];  Service: Cardiovascular;  Laterality: N/A;  . POSTERIOR LAMINECTOMY / DECOMPRESSION LUMBAR SPINE  2006   "bone spurs"    Social History   Socioeconomic History  . Marital status: Married    Spouse name: Not on file  . Number of children: Not on file  . Years of education: 7  . Highest education level: Not on file  Occupational History  . Occupation: Disabled    Employer: DISABLED  Social Needs  .  Financial resource strain: Not on file  . Food insecurity:    Worry: Not on file    Inability: Not on file  . Transportation needs:    Medical: Not on file    Non-medical: Not on file  Tobacco Use  . Smoking status: Current Every Day Smoker    Packs/day: 0.75    Years: 49.00    Pack years: 36.75    Types: Cigarettes  . Smokeless tobacco: Never Used  . Tobacco comment: half a pack a day 12/25/2017  Substance and Sexual Activity  . Alcohol use: Yes    Alcohol/week: 0.0 standard drinks    Comment: 2019 NO ALCOHOL IN 2 MONTHS  . Drug use:  No  . Sexual activity: Not Currently  Lifestyle  . Physical activity:    Days per week: Not on file    Minutes per session: Not on file  . Stress: Not on file  Relationships  . Social connections:    Talks on phone: Not on file    Gets together: Not on file    Attends religious service: Not on file    Active member of club or organization: Not on file    Attends meetings of clubs or organizations: Not on file    Relationship status: Not on file  . Intimate partner violence:    Fear of current or ex partner: Not on file    Emotionally abused: Not on file    Physically abused: Not on file    Forced sexual activity: Not on file  Other Topics Concern  . Not on file  Social History Narrative   Regular exercise-no   Caffeine Use-yes   Lives with wife in a one story home. Has 2 children.  On disability for low back pain.  Used to work as a Games developer, lasted worked in 2007.     Education: 7th grade.     Allergies  Allergen Reactions  . Lisinopril Hives and Itching  . Amitriptyline Other (See Comments)    Makes pt feel "weird" and nausea.  . Contrast Media [Iodinated Diagnostic Agents] Hives and Itching    Pt can use if taking benadryl   . Ibuprofen Other (See Comments)    Internal bleeding     Outpatient Medications Prior to Visit  Medication Sig Dispense Refill  . albuterol (PROVENTIL) (2.5 MG/3ML) 0.083% nebulizer solution Take 3  mLs (2.5 mg total) by nebulization every 6 (six) hours as needed for wheezing or shortness of breath. 75 mL 5  . amLODipine (NORVASC) 5 MG tablet Take 1 tablet (5 mg total) by mouth daily. 90 tablet 1  . aspirin EC 81 MG tablet Take 81 mg by mouth daily.    [name] atorvastatin (LIPITOR) 40 MG tablet Take 1 tablet (40 mg total) by mouth daily at 6 PM. 90 tablet 1  . Blood Glucose Monitoring Suppl (ONETOUCH VERIO) w/Device KIT 1 Device by Does not apply route once. 1 kit 0  . clopidogrel (PLAVIX) 75 MG tablet Take 1 tablet (75 mg total) by mouth daily. 90 tablet 3  . fexofenadine (ALLEGRA ALLERGY) 180 MG tablet Take 1 tablet (180 mg total) by mouth daily. 90 tablet 3  . Fluticasone-Umeclidin-Vilant (TRELEGY ELLIPTA) 100-62.5-25 MCG/INH AEPB Inhale 1 puff into the lungs daily. (Patient taking differently: Inhale 1 puff into the lungs 2 (two) times daily. ) 28 each 5  . nitroGLYCERIN (NITROSTAT) 0.4 MG SL tablet Place 1 tablet (0.4 mg total) under the tongue every 5 (five) minutes as needed for chest pain. 25 tablet 4  . Omega-3 Fatty Acids (FISH OIL) 1200 MG CAPS Take 1,200 mg by mouth daily.     [name] omeprazole (PRILOSEC) 40 MG capsule Take 1 capsule (40 mg total) by mouth daily. 90 capsule 3  . ONE TOUCH LANCETS MISC 1 each by Does not apply route 3 (three) times daily. 200 each 5  . ONETOUCH VERIO test strip USE TO TEST 3 TIMES DAILY 100 each 1  . OVER THE COUNTER MEDICATION Apply 1 application topically daily as needed (neuropathy pain). Hemp oil cream    . PROAIR HFA 108 (90 Base) MCG/ACT inhaler USE 2 PUFFS EVERY 6 HOURS  AS NEEDED FOR WHEEZING OR  SHORTNESS OF BREATH (Patient taking differently: Inhale 2 puffs into the lungs every 6 (six) hours as needed for wheezing or shortness of breath. ) 34 g 3  . traZODone (DESYREL) 100 MG tablet Take 1 tablet (100 mg total) by mouth at bedtime. (Patient taking differently: Take 100 mg by mouth at bedtime as needed for sleep. ) 90 tablet 1  . diphenhydrAMINE  (BENADRYL) 50 MG tablet Take 0.5 tablets (25 mg total) by mouth once. 1 hr prior to CT scan 30 tablet 0   No facility-administered medications prior to visit.     Review of Systems  Constitutional: Negative for chills, fever, malaise/fatigue and weight loss.  HENT: Negative for hearing loss, sore throat and tinnitus.   Eyes: Negative for blurred vision and double vision.  Respiratory: Positive for cough, sputum production and shortness of breath. Negative for hemoptysis, wheezing and stridor.   Cardiovascular: Negative for chest pain, palpitations, orthopnea, leg swelling and PND.  Gastrointestinal: Negative for abdominal pain, constipation, diarrhea, heartburn, nausea and vomiting.  Genitourinary: Negative for dysuria, hematuria and urgency.  Musculoskeletal: Negative for joint pain and myalgias.  Skin: Negative for itching and rash.  Neurological: Negative for dizziness, tingling, weakness and headaches.  Endo/Heme/Allergies: Negative for environmental allergies. Does not bruise/bleed easily.  Psychiatric/Behavioral: Negative for depression. The patient is not nervous/anxious and does not have insomnia.   All other systems reviewed and are negative.    Objective:  Physical Exam  Constitutional: He is oriented to person, place, and time. He appears well-developed and well-nourished. No distress.  HENT:  Head: Normocephalic and atraumatic.  Mouth/Throat: Oropharynx is clear and moist.  Eyes: Pupils are equal, round, and reactive to light. Conjunctivae are normal. No scleral icterus.  Neck: Neck supple. No JVD present. No tracheal deviation present.  Cardiovascular: Normal rate, regular rhythm, normal heart sounds and intact distal pulses.  No murmur heard. Pulmonary/Chest: Effort normal. No accessory muscle usage or stridor. No tachypnea. No respiratory distress. He has wheezes. He has no rhonchi. He has rales.  Abdominal: Soft. Bowel sounds are normal. He exhibits no distension.  There is no tenderness.  Musculoskeletal: He exhibits no edema or tenderness.  Lymphadenopathy:    He has no cervical adenopathy.  Neurological: He is alert and oriented to person, place, and time.  Skin: Skin is warm and dry. Capillary refill takes less than 2 seconds. No rash noted.  Psychiatric: He has a normal mood and affect. His behavior is normal.  Vitals reviewed.    Vitals:   02/05/18 1455  BP: (!) 158/80  Pulse: 73  SpO2: 96%  Weight: 209 lb (94.8 kg)  Height: _0  (1.753 m)   96% on RA BMI Readings from Last 3 Encounters:  02/05/18 30.86 kg/m  01/27/18 30.63 kg/m  01/08/18 30.42 kg/m   Wt Readings from Last 3 Encounters:  02/05/18 209 lb (94.8 kg)  01/27/18 207 lb 6.4 oz (94.1 kg)  01/08/18 206 lb (93.4 kg)     CBC    Component Value Date/Time   WBC 16.7 (H) 01/02/2018 0927   WBC 12.9 (H) 11/25/2016 1430   WBC 12.3 (H) 10/14/2016 1440   RBC 4.79 01/02/2018 0927   RBC 5.94 (H) 11/25/2016 1430   RBC 6.19 (H) 10/14/2016 1440   HGB 16.4 01/02/2018 0927   HGB 19.2 (H) 11/25/2016 1430   HCT 46.2 01/02/2018 0927   HCT 54.8 (H) 11/25/2016 1430   PLT 269 01/02/2018 0927   MCV 97 01/02/2018 0927  MCV 92.3 11/25/2016 1430   MCH 34.2 (H) 01/02/2018 0927   MCH 32.3 11/25/2016 1430   MCH 29.8 02/13/2016 0949   MCHC 35.5 01/02/2018 0927   MCHC 35.0 11/25/2016 1430   MCHC 33.4 10/14/2016 1440   RDW 13.9 01/02/2018 0927   RDW 15.0 (H) 11/25/2016 1430   LYMPHSABS 2.5 11/25/2016 1430   MONOABS 1.3 (H) 11/25/2016 1430   EOSABS 0.3 11/25/2016 1430   BASOSABS 0.0 11/25/2016 1430    Chest Imaging: 2018 CT chest: Right lower lobe 6 mm pulmonary nodule stable from 2014 2018 on prior imaging.  Pulmonary Functions Testing Results:  PFT Results Latest Ref Rng & Units 02/05/2018  FVC-Pre L 3.05  FVC-Predicted Pre % 66  FVC-Post L 3.30  FVC-Predicted Post % 72  Pre FEV1/FVC % % 58  Post FEV1/FCV % % 63  FEV1-Pre L 1.75  FEV1-Predicted Pre % 50  FEV1-Post L  2.09  DLCO UNC% % 75  DLCO COR %Predicted % 82  TLC L 7.59  TLC % Predicted % 111  RV % Predicted % 173     FeNO: None  Pathology: None  Echocardiogram: None   Heart Catheterization:   Prox LAD to Mid LAD lesion, 90% stenosed. Post intervention, there remains 80% residual calcific stenosis.  Mid LAD lesion, 80% stenosed. Dist LAD lesion, 55% stenosed.  Prox Cx lesion, 100% stenosed. Prox Cx to Mid Cx lesion, 99% stenosed. Ost 2nd Mrg to 2nd Mrg lesion, 99% stenosed. -- Seen via retrograde filling from LAD/diagonal-OM collaterals  There is hyperdynamic left ventricular systolic function.  Elevated LVEDP    Assessment & Plan:   Cavitary lesion of lung  Shortness of breath - Plan: NM PET Image Initial (PI) Skull Base To Thigh, CANCELED: NM PET Image Initial (PI) Skull Base To Thigh  Moderate COPD (chronic obstructive pulmonary disease) ([facility])  Discussion:  Today we reviewed his PFTs in clinic that reveals moderate COPD with a postbronchodilator FEV1 of 60%.  He does have a significant bronchial dilator response.  We will continue patient on Trelegy inhaler. Continue albuterol as needed for shortness of breath and wheezing.  Today in the office we reviewed the patient's CT images with the patient and his wife.  We discussed the potential etiologies for the enlarging and changing thin-walled cystic lesion within the right upper lobe with associated sub-solid and groundglass areas.  I am concerned that this could be a alveolar carcinoma in situ or formally called BAC.  Also would consider a cyst associated lung cancer or CALC type lesion.  This has at least been slow-growing as compared to imaging from 2 years ago.  We discussed the risk benefits and alternatives to obtaining tissue sampling or watchful waiting.  Upon this shared decision-making process we decided to obtain a PET scan.  The PET scan if positive we will plan to obtain a super D CT image and move forward for a  electromagnetic navigational bronchoscopy for tissue sampling and hopeful diagnosis.  We also discussed the yield of bronchoscopic diagnosis versus percutaneous approach by our interventional radiology colleagues.  Due to the patient's lesion the location I believe that bronchoscopy offers the best option.  Return to clinic pending pet imaging and potential procedure.  Greater than 50% of this patient's 40-minute visit was spent face-to-face discussing the above recommendations and gnostic approach.   Current Outpatient Medications:  .  albuterol (PROVENTIL) (2.5 MG/3ML) 0.083% nebulizer solution, Take 3 mLs (2.5 mg total) by nebulization every 6 (six) hours  as needed for wheezing or shortness of breath., Disp: 75 mL, Rfl: 5 .  amLODipine (NORVASC) 5 MG tablet, Take 1 tablet (5 mg total) by mouth daily., Disp: 90 tablet, Rfl: 1 .  aspirin EC 81 MG tablet, Take 81 mg by mouth daily., Disp: , Rfl:  .  atorvastatin (LIPITOR) 40 MG tablet, Take 1 tablet (40 mg total) by mouth daily at 6 PM., Disp: 90 tablet, Rfl: 1 .  Blood Glucose Monitoring Suppl (ONETOUCH VERIO) w/Device KIT, 1 Device by Does not apply route once., Disp: 1 kit, Rfl: 0 .  clopidogrel (PLAVIX) 75 MG tablet, Take 1 tablet (75 mg total) by mouth daily., Disp: 90 tablet, Rfl: 3 .  fexofenadine (ALLEGRA ALLERGY) 180 MG tablet, Take 1 tablet (180 mg total) by mouth daily., Disp: 90 tablet, Rfl: 3 .  Fluticasone-Umeclidin-Vilant (TRELEGY ELLIPTA) 100-62.5-25 MCG/INH AEPB, Inhale 1 puff into the lungs daily. (Patient taking differently: Inhale 1 puff into the lungs 2 (two) times daily. ), Disp: 28 each, Rfl: 5 .  nitroGLYCERIN (NITROSTAT) 0.4 MG SL tablet, Place 1 tablet (0.4 mg total) under the tongue every 5 (five) minutes as needed for chest pain., Disp: 25 tablet, Rfl: 4 .  Omega-3 Fatty Acids (FISH OIL) 1200 MG CAPS, Take 1,200 mg by mouth daily. , Disp: , Rfl:  .  omeprazole (PRILOSEC) 40 MG capsule, Take 1 capsule (40 mg total) by  mouth daily., Disp: 90 capsule, Rfl: 3 .  ONE TOUCH LANCETS MISC, 1 each by Does not apply route 3 (three) times daily., Disp: 200 each, Rfl: 5 .  ONETOUCH VERIO test strip, USE TO TEST 3 TIMES DAILY, Disp: 100 each, Rfl: 1 .  OVER THE COUNTER MEDICATION, Apply 1 application topically daily as needed (neuropathy pain). Hemp oil cream, Disp: , Rfl:  .  PROAIR HFA 108 (90 Base) MCG/ACT inhaler, USE 2 PUFFS EVERY 6 HOURS  AS NEEDED FOR WHEEZING OR  SHORTNESS OF BREATH (Patient taking differently: Inhale 2 puffs into the lungs every 6 (six) hours as needed for wheezing or shortness of breath. ), Disp: 34 g, Rfl: 3 .  traZODone (DESYREL) 100 MG tablet, Take 1 tablet (100 mg total) by mouth at bedtime. (Patient taking differently: Take 100 mg by mouth at bedtime as needed for sleep. ), Disp: 90 tablet, Rfl: 1 .  diphenhydrAMINE (BENADRYL) 50 MG tablet, Take 0.5 tablets (25 mg total) by mouth once. 1 hr prior to CT scan, Disp: 30 tablet, Rfl: 0 .  Fluticasone-Umeclidin-Vilant (TRELEGY ELLIPTA) 100-62.5-25 MCG/INH AEPB, Inhale 1 puff into the lungs daily., Disp: 2 each, Rfl: 0   Garner Nash, DO [facility] 02/05/2018 4:32 PM ]

## 2018-02-05 NOTE — Patient Instructions (Addendum)
Thank you for visiting Dr. Valeta Harms at [facility]. Today we recommend the following: Orders Placed This Encounter  Procedures  . NM PET Image Initial (PI) Skull Base To Thigh   RTC in 3 weeks

## 2018-02-05 NOTE — Progress Notes (Signed)
PFT completed today.  

## 2018-02-05 NOTE — Progress Notes (Signed)
Synopsis: Referred in Oct. 2019 for COPD by Jearld Fenton, NP  Subjective:   PATIENT ID: Joshua [name] GENDER: male DOB: 09-21-1955, MRN: [id number]  Chief Complaint  Patient presents with  . Follow-up    PFT States he is having increased trouble bending over with increased mucous production.     PMH of tobacco abuse, smoked since age of 61, at heaviest was 1ppd and now down to 0.5ppd. History of back surgery complicated by DVT. He has planned intervention by cardiology for his PVD on his right leg in two weeks.  He was treated with antibiotics and steroids recently by his primary care provider.  He is recently at the end of his steroid taper.  Presents to the office today with significant dyspnea on exertion, congestion, cough, sputum production as well as chest tightness and wheezing.  He has difficulty getting up and walking from his chair to the bathroom.  He states that he received a breathing treatment by his primary care doctor and felt much better for several hours after that.  He is still continuing to smoke and he knows that he needs to quit but he has been unable to stop.  He has been incarcerated x2 each time for a little less than a year.  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  He denies IV drug use history.  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  OV 02/05/2018: Since the patient was last seen in the office he has been doing much better.  He has started using his trilogy inhaler regularly.  He has cut down smoking.  He is only smoking 3 to 4 cigarettes/day.  He is definitely trying to quit.  Since he was last seen in the office we also completed a low-dose lung cancer screening CT which was read as a lung RADS for a.  This had a enlarging right upper lobe cavitary/cystic lesion with areas of associated groundglass and semisolid material.  He does have dyspnea on exertion.  His daily cough is somewhat better.  He still has sputum  production.    Past Medical History:  Diagnosis Date  . AC (acromioclavicular) joint bone spurs, unspecified laterality 11/25/2016  . Arthritis    "back" (05/16/2015)  . CAD in native artery, occluded Lcx and rotational atherectomy to LAD with DES 05/17/15 05/18/2015  . Chronic back pain    "all my back"  . Chronic bronchitis ([place])   . COPD (chronic obstructive pulmonary disease) ([place])   . GERD (gastroesophageal reflux disease)   . H/O blood clots    "had them in my back; put filter in before one of my neck ORs"  . History of gout   . Hyperlipidemia   . Hypertension   . PAD (peripheral artery disease) ([place])   . Phlebitis   . Positive TB test   . Seasonal allergies   . Shortness of breath   . Spinal disease      Family History  Problem Relation Age of Onset  . Stomach cancer Mother        Deceased, 52s  . Hypertension Mother   . Hypertension Father   . Stroke Father   . Heart attack Father        Deceased, 6  . Aneurysm Brother   . Healthy Daughter   . Healthy Maternal Grandmother   . Leukemia Grandchild   . Diabetes Neg Hx   .  Early death Neg Hx   . Colon cancer Neg Hx   . Pancreatic cancer Neg Hx      Past Surgical History:  Procedure Laterality Date  . ABDOMINAL AORTOGRAM W/LOWER EXTREMITY  01/07/2018  . ABDOMINAL AORTOGRAM W/LOWER EXTREMITY N/A 01/07/2018   Procedure: ABDOMINAL AORTOGRAM W/LOWER EXTREMITY;  Surgeon: Wellington Hampshire, MD;  Location: [facility];  Service: Cardiovascular;  Laterality: N/A;  . ANTERIOR CERVICAL DECOMP/DISCECTOMY FUSION  ~ 2001-2009 X 3  . ANTERIOR CERVICAL DECOMP/DISCECTOMY FUSION N/A 10/06/2012   Procedure: ANTERIOR CERVICAL DECOMPRESSION/DISCECTOMY FUSION 1 LEVEL;  Surgeon: [name], MD;  Location: [facility];  Service: Neurosurgery;  Laterality: N/A;  C7T1 anterior cervical decompression with fusion plating and bonegraft   . BACK SURGERY    . CARDIAC CATHETERIZATION  05/16/2015  . CARDIAC CATHETERIZATION N/A  05/16/2015   Procedure: Left Heart Cath and Coronary Angiography;  Surgeon: [name], MD;  Location: [facility];  Service: Cardiovascular;  Laterality: N/A;  . CARDIAC CATHETERIZATION N/A 05/16/2015   Procedure: Coronary Balloon Angioplasty;  Surgeon: [name], MD;  Location: [facility];  Service: Cardiovascular;  Laterality: N/A;  . CARDIAC CATHETERIZATION N/A 05/17/2015   Procedure: Coronary Stent Intervention Rotoblater;  Surgeon: [name], MD;  Location: [facility];  Service: Cardiovascular;  Laterality: N/A;  . ELBOW SURGERY Left 2016   "had bone pieces removed"  . ESOPHAGEAL MANOMETRY N/A 12/30/2016   Procedure: ESOPHAGEAL MANOMETRY (EM);  Surgeon: Irene Shipper, MD;  Location: [facility];  Service: Endoscopy;  Laterality: N/A;  . EXCISIONAL HEMORRHOIDECTOMY    . FEMORAL BYPASS  ~ 2010  . IR RADIOLOGIST EVAL & MGMT  12/17/2016  . IVC FILTER PLACEMENT ([place] [facility])  2010   "had blood clots in my back"  . PERIPHERAL VASCULAR ATHERECTOMY  01/07/2018   Procedure: PERIPHERAL VASCULAR ATHERECTOMY;  Surgeon: Wellington Hampshire, MD;  Location: [facility];  Service: Cardiovascular;;  . PERIPHERAL VASCULAR BALLOON ANGIOPLASTY  01/07/2018   Procedure: PERIPHERAL VASCULAR BALLOON ANGIOPLASTY;  Surgeon: Wellington Hampshire, MD;  Location: [facility];  Service: Cardiovascular;;  . PERIPHERAL VASCULAR CATHETERIZATION N/A 02/14/2016   Procedure: Abdominal Aortogram w/Lower Extremity;  Surgeon: Wellington Hampshire, MD;  Location: [facility];  Service: Cardiovascular;  Laterality: N/A;  . POSTERIOR LAMINECTOMY / DECOMPRESSION LUMBAR SPINE  2006   "bone spurs"    Social History   Socioeconomic History  . Marital status: Married    Spouse name: Not on file  . Number of children: Not on file  . Years of education: 7  . Highest education level: Not on file  Occupational History  . Occupation: Disabled    Employer: DISABLED  Social Needs  .  Financial resource strain: Not on file  . Food insecurity:    Worry: Not on file    Inability: Not on file  . Transportation needs:    Medical: Not on file    Non-medical: Not on file  Tobacco Use  . Smoking status: Current Every Day Smoker    Packs/day: 0.75    Years: 49.00    Pack years: 36.75    Types: Cigarettes  . Smokeless tobacco: Never Used  . Tobacco comment: half a pack a day 12/25/2017  Substance and Sexual Activity  . Alcohol use: Yes    Alcohol/week: 0.0 standard drinks    Comment: 2019 NO ALCOHOL IN 2 MONTHS  . Drug use:  No  . Sexual activity: Not Currently  Lifestyle  . Physical activity:    Days per week: Not on file    Minutes per session: Not on file  . Stress: Not on file  Relationships  . Social connections:    Talks on phone: Not on file    Gets together: Not on file    Attends religious service: Not on file    Active member of club or organization: Not on file    Attends meetings of clubs or organizations: Not on file    Relationship status: Not on file  . Intimate partner violence:    Fear of current or ex partner: Not on file    Emotionally abused: Not on file    Physically abused: Not on file    Forced sexual activity: Not on file  Other Topics Concern  . Not on file  Social History Narrative   Regular exercise-no   Caffeine Use-yes   Lives with wife in a one story home. Has 2 children.  On disability for low back pain.  Used to work as a Games developer, lasted worked in 2007.     Education: 7th grade.     Allergies  Allergen Reactions  . Lisinopril Hives and Itching  . Amitriptyline Other (See Comments)    Makes pt feel "weird" and nausea.  . Contrast Media [Iodinated Diagnostic Agents] Hives and Itching    Pt can use if taking benadryl   . Ibuprofen Other (See Comments)    Internal bleeding     Outpatient Medications Prior to Visit  Medication Sig Dispense Refill  . albuterol (PROVENTIL) (2.5 MG/3ML) 0.083% nebulizer solution Take 3  mLs (2.5 mg total) by nebulization every 6 (six) hours as needed for wheezing or shortness of breath. 75 mL 5  . amLODipine (NORVASC) 5 MG tablet Take 1 tablet (5 mg total) by mouth daily. 90 tablet 1  . aspirin EC 81 MG tablet Take 81 mg by mouth daily.    [name] atorvastatin (LIPITOR) 40 MG tablet Take 1 tablet (40 mg total) by mouth daily at 6 PM. 90 tablet 1  . Blood Glucose Monitoring Suppl (ONETOUCH VERIO) w/Device KIT 1 Device by Does not apply route once. 1 kit 0  . clopidogrel (PLAVIX) 75 MG tablet Take 1 tablet (75 mg total) by mouth daily. 90 tablet 3  . fexofenadine (ALLEGRA ALLERGY) 180 MG tablet Take 1 tablet (180 mg total) by mouth daily. 90 tablet 3  . Fluticasone-Umeclidin-Vilant (TRELEGY ELLIPTA) 100-62.5-25 MCG/INH AEPB Inhale 1 puff into the lungs daily. (Patient taking differently: Inhale 1 puff into the lungs 2 (two) times daily. ) 28 each 5  . nitroGLYCERIN (NITROSTAT) 0.4 MG SL tablet Place 1 tablet (0.4 mg total) under the tongue every 5 (five) minutes as needed for chest pain. 25 tablet 4  . Omega-3 Fatty Acids (FISH OIL) 1200 MG CAPS Take 1,200 mg by mouth daily.     [name] omeprazole (PRILOSEC) 40 MG capsule Take 1 capsule (40 mg total) by mouth daily. 90 capsule 3  . ONE TOUCH LANCETS MISC 1 each by Does not apply route 3 (three) times daily. 200 each 5  . ONETOUCH VERIO test strip USE TO TEST 3 TIMES DAILY 100 each 1  . OVER THE COUNTER MEDICATION Apply 1 application topically daily as needed (neuropathy pain). Hemp oil cream    . PROAIR HFA 108 (90 Base) MCG/ACT inhaler USE 2 PUFFS EVERY 6 HOURS  AS NEEDED FOR WHEEZING OR  SHORTNESS OF BREATH (Patient taking differently: Inhale 2 puffs into the lungs every 6 (six) hours as needed for wheezing or shortness of breath. ) 34 g 3  . traZODone (DESYREL) 100 MG tablet Take 1 tablet (100 mg total) by mouth at bedtime. (Patient taking differently: Take 100 mg by mouth at bedtime as needed for sleep. ) 90 tablet 1  . diphenhydrAMINE  (BENADRYL) 50 MG tablet Take 0.5 tablets (25 mg total) by mouth once. 1 hr prior to CT scan 30 tablet 0   No facility-administered medications prior to visit.     Review of Systems  Constitutional: Negative for chills, fever, malaise/fatigue and weight loss.  HENT: Negative for hearing loss, sore throat and tinnitus.   Eyes: Negative for blurred vision and double vision.  Respiratory: Positive for cough, sputum production and shortness of breath. Negative for hemoptysis, wheezing and stridor.   Cardiovascular: Negative for chest pain, palpitations, orthopnea, leg swelling and PND.  Gastrointestinal: Negative for abdominal pain, constipation, diarrhea, heartburn, nausea and vomiting.  Genitourinary: Negative for dysuria, hematuria and urgency.  Musculoskeletal: Negative for joint pain and myalgias.  Skin: Negative for itching and rash.  Neurological: Negative for dizziness, tingling, weakness and headaches.  Endo/Heme/Allergies: Negative for environmental allergies. Does not bruise/bleed easily.  Psychiatric/Behavioral: Negative for depression. The patient is not nervous/anxious and does not have insomnia.   All other systems reviewed and are negative.    Objective:  Physical Exam  Constitutional: He is oriented to person, place, and time. He appears well-developed and well-nourished. No distress.  HENT:  Head: Normocephalic and atraumatic.  Mouth/Throat: Oropharynx is clear and moist.  Eyes: Pupils are equal, round, and reactive to light. Conjunctivae are normal. No scleral icterus.  Neck: Neck supple. No JVD present. No tracheal deviation present.  Cardiovascular: Normal rate, regular rhythm, normal heart sounds and intact distal pulses.  No murmur heard. Pulmonary/Chest: Effort normal. No accessory muscle usage or stridor. No tachypnea. No respiratory distress. He has wheezes. He has no rhonchi. He has rales.  Abdominal: Soft. Bowel sounds are normal. He exhibits no distension.  There is no tenderness.  Musculoskeletal: He exhibits no edema or tenderness.  Lymphadenopathy:    He has no cervical adenopathy.  Neurological: He is alert and oriented to person, place, and time.  Skin: Skin is warm and dry. Capillary refill takes less than 2 seconds. No rash noted.  Psychiatric: He has a normal mood and affect. His behavior is normal.  Vitals reviewed.    Vitals:   02/05/18 1455  BP: (!) 158/80  Pulse: 73  SpO2: 96%  Weight: 209 lb (94.8 kg)  Height: _0  (1.753 m)   96% on RA BMI Readings from Last 3 Encounters:  02/05/18 30.86 kg/m  01/27/18 30.63 kg/m  01/08/18 30.42 kg/m   Wt Readings from Last 3 Encounters:  02/05/18 209 lb (94.8 kg)  01/27/18 207 lb 6.4 oz (94.1 kg)  01/08/18 206 lb (93.4 kg)     CBC    Component Value Date/Time   WBC 16.7 (H) 01/02/2018 0927   WBC 12.9 (H) 11/25/2016 1430   WBC 12.3 (H) 10/14/2016 1440   RBC 4.79 01/02/2018 0927   RBC 5.94 (H) 11/25/2016 1430   RBC 6.19 (H) 10/14/2016 1440   HGB 16.4 01/02/2018 0927   HGB 19.2 (H) 11/25/2016 1430   HCT 46.2 01/02/2018 0927   HCT 54.8 (H) 11/25/2016 1430   PLT 269 01/02/2018 0927   MCV 97 01/02/2018 0927  MCV 92.3 11/25/2016 1430   MCH 34.2 (H) 01/02/2018 0927   MCH 32.3 11/25/2016 1430   MCH 29.8 02/13/2016 0949   MCHC 35.5 01/02/2018 0927   MCHC 35.0 11/25/2016 1430   MCHC 33.4 10/14/2016 1440   RDW 13.9 01/02/2018 0927   RDW 15.0 (H) 11/25/2016 1430   LYMPHSABS 2.5 11/25/2016 1430   MONOABS 1.3 (H) 11/25/2016 1430   EOSABS 0.3 11/25/2016 1430   BASOSABS 0.0 11/25/2016 1430    Chest Imaging: 2018 CT chest: Right lower lobe 6 mm pulmonary nodule stable from 2014 2018 on prior imaging.  Pulmonary Functions Testing Results:  PFT Results Latest Ref Rng & Units 02/05/2018  FVC-Pre L 3.05  FVC-Predicted Pre % 66  FVC-Post L 3.30  FVC-Predicted Post % 72  Pre FEV1/FVC % % 58  Post FEV1/FCV % % 63  FEV1-Pre L 1.75  FEV1-Predicted Pre % 50  FEV1-Post L  2.09  DLCO UNC% % 75  DLCO COR %Predicted % 82  TLC L 7.59  TLC % Predicted % 111  RV % Predicted % 173     FeNO: None  Pathology: None  Echocardiogram: None   Heart Catheterization:   Prox LAD to Mid LAD lesion, 90% stenosed. Post intervention, there remains 80% residual calcific stenosis.  Mid LAD lesion, 80% stenosed. Dist LAD lesion, 55% stenosed.  Prox Cx lesion, 100% stenosed. Prox Cx to Mid Cx lesion, 99% stenosed. Ost 2nd Mrg to 2nd Mrg lesion, 99% stenosed. -- Seen via retrograde filling from LAD/diagonal-OM collaterals  There is hyperdynamic left ventricular systolic function.  Elevated LVEDP    Assessment & Plan:   Cavitary lesion of lung  Shortness of breath - Plan: NM PET Image Initial (PI) Skull Base To Thigh, CANCELED: NM PET Image Initial (PI) Skull Base To Thigh  Moderate COPD (chronic obstructive pulmonary disease) (HCC)  Discussion:  Today we reviewed his PFTs in clinic that reveals moderate COPD with a postbronchodilator FEV1 of 60%.  He does have a significant bronchial dilator response.  We will continue patient on Trelegy inhaler. Continue albuterol as needed for shortness of breath and wheezing.  Today in the office we reviewed the patient's CT images with the patient and his wife.  We discussed the potential etiologies for the enlarging and changing thin-walled cystic lesion within the right upper lobe with associated sub-solid and groundglass areas.  I am concerned that this could be a alveolar carcinoma in situ or formally called BAC.  Also would consider a cyst associated lung cancer or CALC type lesion.  This has at least been slow-growing as compared to imaging from 2 years ago.  We discussed the risk benefits and alternatives to obtaining tissue sampling or watchful waiting.  Upon this shared decision-making process we decided to obtain a PET scan.  The PET scan if positive we will plan to obtain a super D CT image and move forward for a  electromagnetic navigational bronchoscopy for tissue sampling and hopeful diagnosis.  We also discussed the yield of bronchoscopic diagnosis versus percutaneous approach by our interventional radiology colleagues.  Due to the patient's lesion the location I believe that bronchoscopy offers the best option.  Return to clinic pending pet imaging and potential procedure.  Greater than 50% of this patient's 40-minute visit was spent face-to-face discussing the above recommendations and gnostic approach.   Current Outpatient Medications:  .  albuterol (PROVENTIL) (2.5 MG/3ML) 0.083% nebulizer solution, Take 3 mLs (2.5 mg total) by nebulization every 6 (six) hours  as needed for wheezing or shortness of breath., Disp: 75 mL, Rfl: 5 .  amLODipine (NORVASC) 5 MG tablet, Take 1 tablet (5 mg total) by mouth daily., Disp: 90 tablet, Rfl: 1 .  aspirin EC 81 MG tablet, Take 81 mg by mouth daily., Disp: , Rfl:  .  atorvastatin (LIPITOR) 40 MG tablet, Take 1 tablet (40 mg total) by mouth daily at 6 PM., Disp: 90 tablet, Rfl: 1 .  Blood Glucose Monitoring Suppl (ONETOUCH VERIO) w/Device KIT, 1 Device by Does not apply route once., Disp: 1 kit, Rfl: 0 .  clopidogrel (PLAVIX) 75 MG tablet, Take 1 tablet (75 mg total) by mouth daily., Disp: 90 tablet, Rfl: 3 .  fexofenadine (ALLEGRA ALLERGY) 180 MG tablet, Take 1 tablet (180 mg total) by mouth daily., Disp: 90 tablet, Rfl: 3 .  Fluticasone-Umeclidin-Vilant (TRELEGY ELLIPTA) 100-62.5-25 MCG/INH AEPB, Inhale 1 puff into the lungs daily. (Patient taking differently: Inhale 1 puff into the lungs 2 (two) times daily. ), Disp: 28 each, Rfl: 5 .  nitroGLYCERIN (NITROSTAT) 0.4 MG SL tablet, Place 1 tablet (0.4 mg total) under the tongue every 5 (five) minutes as needed for chest pain., Disp: 25 tablet, Rfl: 4 .  Omega-3 Fatty Acids (FISH OIL) 1200 MG CAPS, Take 1,200 mg by mouth daily. , Disp: , Rfl:  .  omeprazole (PRILOSEC) 40 MG capsule, Take 1 capsule (40 mg total) by  mouth daily., Disp: 90 capsule, Rfl: 3 .  ONE TOUCH LANCETS MISC, 1 each by Does not apply route 3 (three) times daily., Disp: 200 each, Rfl: 5 .  ONETOUCH VERIO test strip, USE TO TEST 3 TIMES DAILY, Disp: 100 each, Rfl: 1 .  OVER THE COUNTER MEDICATION, Apply 1 application topically daily as needed (neuropathy pain). Hemp oil cream, Disp: , Rfl:  .  PROAIR HFA 108 (90 Base) MCG/ACT inhaler, USE 2 PUFFS EVERY 6 HOURS  AS NEEDED FOR WHEEZING OR  SHORTNESS OF BREATH (Patient taking differently: Inhale 2 puffs into the lungs every 6 (six) hours as needed for wheezing or shortness of breath. ), Disp: 34 g, Rfl: 3 .  traZODone (DESYREL) 100 MG tablet, Take 1 tablet (100 mg total) by mouth at bedtime. (Patient taking differently: Take 100 mg by mouth at bedtime as needed for sleep. ), Disp: 90 tablet, Rfl: 1 .  diphenhydrAMINE (BENADRYL) 50 MG tablet, Take 0.5 tablets (25 mg total) by mouth once. 1 hr prior to CT scan, Disp: 30 tablet, Rfl: 0 .  Fluticasone-Umeclidin-Vilant (TRELEGY ELLIPTA) 100-62.5-25 MCG/INH AEPB, Inhale 1 puff into the lungs daily., Disp: 2 each, Rfl: 0   Garner Nash, DO [facility] 02/05/2018 4:32 PM ]

## 2018-02-09 ENCOUNTER — Ambulatory Visit (HOSPITAL_COMMUNITY)
Admission: RE | Admit: 2018-02-09 | Discharge: 2018-02-09 | Disposition: A | Discharge status: Home / Self Care | Payer: Medicare Other | Source: Ambulatory Visit | Attending: Pulmonary Disease | Admitting: Pulmonary Disease

## 2018-02-09 DIAGNOSIS — R918 Other nonspecific abnormal finding of lung field: Secondary | ICD-10-CM | POA: Diagnosis not present

## 2018-02-09 DIAGNOSIS — R0602 Shortness of breath: Secondary | ICD-10-CM | POA: Insufficient documentation

## 2018-02-09 LAB — GLUCOSE, CAPILLARY: GLUCOSE-CAPILLARY: 104 mg/dL — AB (ref 70–99)

## 2018-02-09 MED ORDER — FLUDEOXYGLUCOSE F - 18 (FDG) INJECTION
10.1000 | Freq: Once | INTRAVENOUS | Status: AC | PRN
  Administered 2018-02-09: 10.1 via INTRAVENOUS

## 2018-02-10 ENCOUNTER — Telehealth: Payer: Self-pay

## 2018-02-10 ENCOUNTER — Encounter: Payer: Self-pay | Admitting: Pulmonary Disease

## 2018-02-10 DIAGNOSIS — E119 Type 2 diabetes mellitus without complications: Secondary | ICD-10-CM | POA: Diagnosis not present

## 2018-02-10 DIAGNOSIS — J984 Other disorders of lung: Secondary | ICD-10-CM

## 2018-02-10 NOTE — Telephone Encounter (Signed)
Called and spoke with patient, made aware we are placing orders for a Chest CT as well as Navigational Bronch. Patient voices understanding. Orders placed. Patient discussed in detail with Dr. Valeta Harms on phone. Discussed with patient we will stop plavix 1 week prior to procedure.  Nothing further is needed at this time.

## 2018-02-16 ENCOUNTER — Ambulatory Visit (INDEPENDENT_AMBULATORY_CARE_PROVIDER_SITE_OTHER)
Admission: RE | Admit: 2018-02-16 | Discharge: 2018-02-16 | Disposition: A | Discharge status: Home / Self Care | Payer: Medicare Other | Source: Ambulatory Visit | Attending: Pulmonary Disease | Admitting: Pulmonary Disease

## 2018-02-16 ENCOUNTER — Telehealth: Payer: Self-pay

## 2018-02-16 DIAGNOSIS — J439 Emphysema, unspecified: Secondary | ICD-10-CM | POA: Diagnosis not present

## 2018-02-16 DIAGNOSIS — R0989 Other specified symptoms and signs involving the circulatory and respiratory systems: Secondary | ICD-10-CM | POA: Diagnosis not present

## 2018-02-16 DIAGNOSIS — J984 Other disorders of lung: Secondary | ICD-10-CM | POA: Diagnosis not present

## 2018-02-16 DIAGNOSIS — K253 Acute gastric ulcer without hemorrhage or perforation: Secondary | ICD-10-CM

## 2018-02-16 DIAGNOSIS — K269 Duodenal ulcer, unspecified as acute or chronic, without hemorrhage or perforation: Secondary | ICD-10-CM

## 2018-02-16 MED ORDER — OMEPRAZOLE 40 MG PO CPDR: 40.0000 mg | DELAYED_RELEASE_CAPSULE | Freq: Every day | ORAL | 0 refills | Status: DC

## 2018-02-16 NOTE — Telephone Encounter (Signed)
Refilled Omeprazole but patient needs office visit for further refills

## 2018-02-17 ENCOUNTER — Telehealth: Payer: Self-pay | Admitting: Cardiovascular Disease

## 2018-02-17 NOTE — Telephone Encounter (Signed)
   [facility] Pre-operative Risk Assessment    Request for surgical clearance:  1. What type of surgery is being performed? bronchoscopy  2. When is this surgery scheduled? Not listed  3. What type of clearance is required (medical clearance vs. Pharmacy clearance to hold med vs. Both)? pharmacy  4. Are there any medications that need to be held prior to surgery and how long? Plavix 75 mg  5. Practice name and name of physician performing surgery? Dr. [facility]  6. What is your office phone number [phone]   7.   What is your office fax number [phone]  8.   Anesthesia type (None, local, MAC, general) ? Not listed   Lucienne Minks 02/17/2018, 4:17 PM  _________________________________________________________________   (provider comments below)

## 2018-02-17 NOTE — Pre-Procedure Instructions (Signed)
Lavonna Monarch  02/17/2018      [id number] [address], [place] [address] Phone: [phone] Fax: [phone]  [place], [place] [place] [address] Phone: [phone] Fax: [place], [place] [address] Phone: [phone] Fax: [phone]    Your procedure is scheduled on December 11th.  Report to [facility] Admitting at 8:30 A.M.  Call this number if you have problems the morning of surgery:  [phone]   Remember:  Do not eat or drink after midnight.     Take these medicines the morning of surgery with A SIP OF WATER    Albuterol Nebulizer - if needed  Fexofenadine (Allegra) - if needed  Proair Inhaler - if needed     7 days prior to surgery STOP taking any Aspirin(unless otherwise instructed by your surgeon), Aleve, Naproxen, Ibuprofen, Motrin, Advil, Goody's, BC's, all herbal medications, fish oil, and all vitamins    Do not wear jewelry, make-up or nail polish.  Do not wear lotions, powders, or perfumes, or deodorant.  Do not shave 48 hours prior to surgery.  Men may shave face and neck.  Do not bring valuables to the hospital.  [facility] is not responsible for any belongings or valuables.   [place]- Preparing For Surgery  Before surgery, you can play an important role. Because skin is not sterile, your skin needs to be as free of germs as possible. You can reduce the number of germs on your skin by washing with CHG (chlorahexidine gluconate) Soap before surgery.  CHG is an antiseptic cleaner which kills germs and bonds with the skin to continue killing germs even after washing.    Oral Hygiene is also important to reduce your risk of infection.  Remember - BRUSH YOUR TEETH THE MORNING OF SURGERY WITH YOUR REGULAR  TOOTHPASTE  Please do not use if you have an allergy to CHG or antibacterial soaps. If your skin becomes reddened/irritated stop using the CHG.  Do not shave (including legs and underarms) for at least 48 hours prior to first CHG shower. It is OK to shave your face.  Please follow these instructions carefully.   1. Shower the NIGHT BEFORE SURGERY and the MORNING OF SURGERY with CHG.   2. If you chose to wash your hair, wash your hair first as usual with your normal shampoo.  3. After you shampoo, rinse your hair and body thoroughly to remove the shampoo.  4. Use CHG as you would any other liquid soap. You can apply CHG directly to the skin and wash gently with a scrungie or a clean washcloth.   5. Apply the CHG Soap to your body ONLY FROM THE NECK DOWN.  Do not use on open wounds or open sores. Avoid contact with your eyes, ears, mouth and genitals (private parts). Wash Face and genitals (private parts)  with your normal soap.  6. Wash thoroughly, paying special attention to the area where your surgery will be performed.  7. Thoroughly rinse your body with warm water from the neck down.  8. DO NOT shower/wash with your normal soap after using and rinsing off the CHG Soap.  9. Pat yourself dry with a CLEAN TOWEL.  10. Wear CLEAN PAJAMAS to bed the night before surgery, wear  comfortable clothes the morning of surgery  11. Place CLEAN SHEETS on your bed the night of your first shower and DO NOT SLEEP WITH PETS.  Day of Surgery:  Do not apply any deodorants/lotions.  Please wear clean clothes to the hospital/surgery center.   Remember to brush your teeth WITH YOUR REGULAR TOOTHPASTE.    Contacts, dentures or bridgework may not be worn into surgery.  Leave your suitcase in the car.  After surgery it may be brought to your room.  For patients admitted to the hospital, discharge time will be determined by your treatment team.  Patients discharged the day of surgery will not be  allowed to drive home.

## 2018-02-18 ENCOUNTER — Encounter (HOSPITAL_COMMUNITY)
Admission: RE | Admit: 2018-02-18 | Discharge: 2018-02-18 | Disposition: A | Discharge status: Home / Self Care | Payer: Medicare Other | Source: Ambulatory Visit | Attending: Pulmonary Disease | Admitting: Pulmonary Disease

## 2018-02-18 ENCOUNTER — Encounter (HOSPITAL_COMMUNITY): Payer: Self-pay

## 2018-02-18 ENCOUNTER — Other Ambulatory Visit: Payer: Self-pay

## 2018-02-18 DIAGNOSIS — K219 Gastro-esophageal reflux disease without esophagitis: Secondary | ICD-10-CM | POA: Insufficient documentation

## 2018-02-18 DIAGNOSIS — I739 Peripheral vascular disease, unspecified: Secondary | ICD-10-CM | POA: Insufficient documentation

## 2018-02-18 DIAGNOSIS — E785 Hyperlipidemia, unspecified: Secondary | ICD-10-CM | POA: Insufficient documentation

## 2018-02-18 DIAGNOSIS — I10 Essential (primary) hypertension: Secondary | ICD-10-CM | POA: Insufficient documentation

## 2018-02-18 DIAGNOSIS — E119 Type 2 diabetes mellitus without complications: Secondary | ICD-10-CM | POA: Diagnosis not present

## 2018-02-18 DIAGNOSIS — Z7902 Long term (current) use of antithrombotics/antiplatelets: Secondary | ICD-10-CM | POA: Diagnosis not present

## 2018-02-18 DIAGNOSIS — Z7951 Long term (current) use of inhaled steroids: Secondary | ICD-10-CM | POA: Diagnosis not present

## 2018-02-18 DIAGNOSIS — Z79899 Other long term (current) drug therapy: Secondary | ICD-10-CM | POA: Diagnosis not present

## 2018-02-18 DIAGNOSIS — Z9889 Other specified postprocedural states: Secondary | ICD-10-CM | POA: Diagnosis not present

## 2018-02-18 DIAGNOSIS — I2510 Atherosclerotic heart disease of native coronary artery without angina pectoris: Secondary | ICD-10-CM | POA: Insufficient documentation

## 2018-02-18 DIAGNOSIS — E1151 Type 2 diabetes mellitus with diabetic peripheral angiopathy without gangrene: Secondary | ICD-10-CM | POA: Diagnosis not present

## 2018-02-18 DIAGNOSIS — Z86718 Personal history of other venous thrombosis and embolism: Secondary | ICD-10-CM | POA: Diagnosis not present

## 2018-02-18 DIAGNOSIS — C3411 Malignant neoplasm of upper lobe, right bronchus or lung: Secondary | ICD-10-CM | POA: Diagnosis not present

## 2018-02-18 DIAGNOSIS — Z01812 Encounter for preprocedural laboratory examination: Secondary | ICD-10-CM | POA: Insufficient documentation

## 2018-02-18 DIAGNOSIS — F17210 Nicotine dependence, cigarettes, uncomplicated: Secondary | ICD-10-CM | POA: Diagnosis not present

## 2018-02-18 DIAGNOSIS — R911 Solitary pulmonary nodule: Secondary | ICD-10-CM | POA: Diagnosis present

## 2018-02-18 DIAGNOSIS — J449 Chronic obstructive pulmonary disease, unspecified: Secondary | ICD-10-CM | POA: Diagnosis not present

## 2018-02-18 DIAGNOSIS — Z7982 Long term (current) use of aspirin: Secondary | ICD-10-CM | POA: Diagnosis not present

## 2018-02-18 DIAGNOSIS — Z955 Presence of coronary angioplasty implant and graft: Secondary | ICD-10-CM | POA: Diagnosis not present

## 2018-02-18 HISTORY — DX: Type 2 diabetes mellitus without complications: E11.9

## 2018-02-18 HISTORY — DX: Myoneural disorder, unspecified: G70.9

## 2018-02-18 LAB — COMPREHENSIVE METABOLIC PANEL
ALT: 46 U/L — ABNORMAL HIGH (ref 0–44)
AST: 46 U/L — ABNORMAL HIGH (ref 15–41)
Albumin: 3.5 g/dL (ref 3.5–5.0)
Alkaline Phosphatase: 74 U/L (ref 38–126)
Anion gap: 14 (ref 5–15)
BILIRUBIN TOTAL: 0.4 mg/dL (ref 0.3–1.2)
BUN: 6 mg/dL — ABNORMAL LOW (ref 8–23)
CO2: 21 mmol/L — ABNORMAL LOW (ref 22–32)
Calcium: 9.3 mg/dL (ref 8.9–10.3)
Chloride: 104 mmol/L (ref 98–111)
Creatinine, Ser: 0.91 mg/dL (ref 0.61–1.24)
Glucose, Bld: 103 mg/dL — ABNORMAL HIGH (ref 70–99)
Potassium: 4.4 mmol/L (ref 3.5–5.1)
Sodium: 139 mmol/L (ref 135–145)
Total Protein: 6.6 g/dL (ref 6.5–8.1)

## 2018-02-18 LAB — CBC
HEMATOCRIT: 52 % (ref 39.0–52.0)
Hemoglobin: 17.2 g/dL — ABNORMAL HIGH (ref 13.0–17.0)
MCH: 32.4 pg (ref 26.0–34.0)
MCHC: 33.1 g/dL (ref 30.0–36.0)
MCV: 97.9 fL (ref 80.0–100.0)
Platelets: 226 10*3/uL (ref 150–400)
RBC: 5.31 MIL/uL (ref 4.22–5.81)
RDW: 13.2 % (ref 11.5–15.5)
WBC: 11 10*3/uL — ABNORMAL HIGH (ref 4.0–10.5)
nRBC: 0 % (ref 0.0–0.2)

## 2018-02-18 LAB — GLUCOSE, CAPILLARY: Glucose-Capillary: 133 mg/dL — ABNORMAL HIGH (ref 70–99)

## 2018-02-18 LAB — PROTIME-INR
INR: 0.92
Prothrombin Time: 12.3 seconds (ref 11.4–15.2)

## 2018-02-18 LAB — APTT: aPTT: 28 seconds (ref 24–36)

## 2018-02-18 NOTE — Progress Notes (Signed)
PCP -[facility] Cardiologist - Dr. Fletcher [name]  Chest x-ray - 12/26/17 EKG - 11/11/17 Stress Test - 05/10/15 ECHO - denies Cardiac Cath - 01/07/18  Sleep Study - 05/16/15 CPAP - Denies use- states he was not told he needed to use a CPAP  Fasting Blood Sugar : 120-130 Checks Blood Sugar 2 times a day. Patient is diet controlled, not taking any medications for DM.   Aspirin Instructions:patient advised to stop ASA 7 days prior to surgery. Last dose of plavix 02/15/18.  Patient states he uses Hemp cream for his peripheral neuropathy. Advised to hold use for 7 days prior to surgery.   Anesthesia review: yes- history of CAD  Patient denies shortness of breath, fever, cough and chest pain at PAT appointment   Patient verbalized understanding of instructions that were given to them at the PAT appointment. Patient was also instructed that they will need to review over the PAT instructions again at home before surgery.

## 2018-02-18 NOTE — Progress Notes (Addendum)
Anesthesia Chart Review:  Case:  [id number] Date/Time:  02/25/18 1015   Procedures:      VIDEO BRONCHOSCOPY WITH ENDOBRONCHIAL ULTRASOUND (Right )     VIDEO BRONCHOSCOPY WITH ENDOBRONCHIAL NAVIGATION (Right )   Anesthesia type:  General   Pre-op diagnosis:  RIGHT UPPER LOBE LUNG CAVITARY LESION   Location:  [facility] OR ROOM 10 / [address] OR   Surgeon:  Garner Nash, DO      DISCUSSION: Patient is a 62 year old male scheduled for the above procedure. He is a smoker who recently had a lung cancer screening CT that showed a RU lobe lesion.  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   Per Dr. Valeta Harms, patient to hold Plavix for 7 days before surgery. Patient reported his last dose Plavix was on 02/15/18 though and holding ASA for 7 days. His office has reached out to cardiology, but response is still pending.  Diabetes is listed, although he is not on any DM medication and last A1c on 10/14/16 was only 5.8. A1c added since last one > 1 year ago, but result remains good at 5.1.   Awaiting for cardiology input, but no new testing ordered at his visit last month so I am anticipating that he can proceed as planned if no acute changes. Will leave chart for follow-up cardiology recommendations. (UPDATE 02/20/18 9:57 AM: Per Dr. Fletcher [name], okay to hold Plavix for 5 days before procedure and resume one day after. Plavix and ASA already on hold according to PAT RN notation. I spoke with Dr. Juline Patch staff to clarify with patient whether or not he is suppose to be holding ASA as well since I don't see it specifically addressed by Dr. Valeta Harms or Dr. Fletcher [name].)   VS: BP (!) 159/83   Pulse 68   Temp 36.6 C   Resp 20   Ht '5\' 9"'  (1.753 m)   Wt 94.8 kg    SpO2 100%   BMI 30.88 kg/m   PROVIDERS: Jearld Fenton, NP is PCP.  Kathlyn Sacramento, MD is cardiologist. Last visit 01/27/18. Continue medical therapy for CAD.    LABS: Labs reviewed: Acceptable for surgery. (all labs ordered are listed, but only abnormal results are displayed)  Labs Reviewed  GLUCOSE, CAPILLARY - Abnormal; Notable for the following components:      Result Value   Glucose-Capillary 133 (*)    All other components within normal limits  CBC - Abnormal; Notable for the following components:   WBC 11.0 (*)    Hemoglobin 17.2 (*)    All other components within normal limits  COMPREHENSIVE METABOLIC PANEL - Abnormal; Notable for the following components:   CO2 21 (*)    Glucose, Bld 103 (*)    BUN 6 (*)    AST 46 (*)    ALT 46 (*)    All other components within normal limits  APTT  PROTIME-INR    PFTs 02/05/18: PRE: FVC 3.05 (66%), FEV1 1.75 (50%). POST: FVC 3.30 (72%), FEV1 2.09 (60%). DLCO unc 23.38 (75%).   IMAGES: CT Super D Chest 02/16/18: IMPRESSION: 1. Thin wall cavitary RIGHT upper lobe lesion. 2. Coronary artery calcification and Aortic Atherosclerosis (ICD10-I70.0).  PET scan 02/09/18: IMPRESSION: 1. No hypermetabolism associated with the thin walled cavitary lesion  seen on recent screening chest CT. Well differentiated or low-grade neoplasm can be poorly FDG avid. 2. Nodular hypermetabolism in the central thyroid gland, nonspecific but correlation with PSA suggested. 3.  Aortic Atherosclerois (ICD10-170.0)   EKG: 11/11/17: NSR, possible LAE.   CV: Aortogram with BLE runoff 01/07/18: 1.  Widely patent aortobifemoral bypass with no obstructive disease. 2.  Right lower extremity: Severe calcified stenosis affecting the ostial SFA with moderate calcified disease in the mid and distal segment, three-vessel runoff below the knee. 3.  Left lower extremity: 60% ostial stenosis of the SFA with no other obstructive disease. 4.  Successful  orbital atherectomy and drug-coated balloon angioplasty to the ostial and proximal right SFA. Recommendations: The patient is already on dual antiplatelet therapy. Continue aggressive treatment of risk factors.  Cardiac cath 05/16/15:  Prox LAD to Mid LAD lesion, 90% stenosed. Post intervention, there remains 80% residual calcific stenosis.  Mid LAD lesion, 80% stenosed. Dist LAD lesion, 55% stenosed.  Prox Cx lesion, 100% stenosed. Prox Cx to Mid Cx lesion, 99% stenosed. Ost 2nd Mrg to 2nd Mrg lesion, 99% stenosed. -- Seen via retrograde filling from LAD/diagonal-OM collaterals  There is hyperdynamic left ventricular systolic function.  Elevated LVEDP Severe 2 site lesion in the LAD. Very rigid lesions that would not yield to Cutting Balloon or noncompliant balloon angioplasty. These lesions are likely best treated with rotational atherectomy after review with Dr. Peter Martinique. Plan: Monitor overnight following PTCA. We'll plan for return to the Cath Lab tomorrow for rotational atherectomy of the LAD with stenting of the 2 lesions.  PCI 05/17/15:  Mid LAD lesion, 80% stenosed. Post intervention, there is a 0% residual stenosis.  Prox LAD to Mid LAD lesion, 80% stenosed. Post intervention, there is a 0% residual stenosis. The lesion was previously treated with angioplasty. Successful rotational atherectomy and stenting of tandem lesions in the mid LAD with DES. Plan: DAPT for one year. Anticipate DC in am.  Carotid U/S 11/24/17: Final Interpretation: - Right Carotid: Velocities in the right ICA are consistent with a 1-39% stenosis. - Left Carotid: Velocities in the left ICA are consistent with a 1-39% stenosis.       Non-hemodynamically significant plaque noted in the CCA. - Vertebrals: Bilateral vertebral arteries demonstrate antegrade flow. - Subclavians: Normal flow hemodynamics were seen in bilateral subclavian       arteries.  Past Medical History:  Diagnosis Date   . AC (acromioclavicular) joint bone spurs, unspecified laterality 11/25/2016  . Arthritis    "back" (05/16/2015)  . CAD in native artery, occluded Lcx and rotational atherectomy to LAD with DES 05/17/15 05/18/2015  . Chronic back pain    "all my back"  . Chronic bronchitis ([place])   . COPD (chronic obstructive pulmonary disease) ([place])   . Diabetes mellitus without complication ([place])   . GERD (gastroesophageal reflux disease)   . H/O blood clots    "had them in my back; put filter in before one of my neck ORs"  . History of gout   . Hyperlipidemia   . Hypertension   . Neuromuscular disorder ([place])   . PAD (peripheral artery disease) ([place])   . Phlebitis   . Positive TB test   . Seasonal allergies   . Shortness of breath   . Spinal disease     Past Surgical History:  Procedure Laterality Date  . ABDOMINAL AORTOGRAM W/LOWER EXTREMITY  01/07/2018  . ABDOMINAL AORTOGRAM W/LOWER EXTREMITY N/A 01/07/2018   Procedure: ABDOMINAL AORTOGRAM W/LOWER EXTREMITY;  Surgeon: Wellington Hampshire, MD;  Location: [place] [facility] LAB;  Service: Cardiovascular;  Laterality: N/A;  . ANTERIOR CERVICAL DECOMP/DISCECTOMY FUSION  ~ 2001-2009 X 3  . ANTERIOR CERVICAL DECOMP/DISCECTOMY FUSION N/A 10/06/2012   Procedure: ANTERIOR CERVICAL DECOMPRESSION/DISCECTOMY FUSION 1 LEVEL;  Surgeon: [name], MD;  Location: [place] [facility] ORS;  Service: Neurosurgery;  Laterality: N/A;  C7T1 anterior cervical decompression with fusion plating and bonegraft   . BACK SURGERY    . CARDIAC CATHETERIZATION  05/16/2015  . CARDIAC CATHETERIZATION N/A 05/16/2015   Procedure: Left Heart Cath and Coronary Angiography;  Surgeon: [name], MD;  Location: [place] CV LAB;  Service: Cardiovascular;  Laterality: N/A;  . CARDIAC CATHETERIZATION N/A 05/16/2015   Procedure: Coronary Balloon Angioplasty;  Surgeon: [name], MD;  Location: [place] CV LAB;  Service: Cardiovascular;  Laterality: N/A;  . CARDIAC CATHETERIZATION N/A  05/17/2015   Procedure: Coronary Stent Intervention Rotoblater;  Surgeon: [name], MD;  Location: [place] [facility];  Service: Cardiovascular;  Laterality: N/A;  . ELBOW SURGERY Left 2016   "had bone pieces removed"  . ESOPHAGEAL MANOMETRY N/A 12/30/2016   Procedure: ESOPHAGEAL MANOMETRY (EM);  Surgeon: Irene Shipper, MD;  Location: [facility];  Service: Endoscopy;  Laterality: N/A;  . EXCISIONAL HEMORRHOIDECTOMY    . FEMORAL BYPASS  ~ 2010  . IR RADIOLOGIST EVAL & MGMT  12/17/2016  . IVC FILTER PLACEMENT ([place] HX)  2010   "had blood clots in my back"  . PERIPHERAL VASCULAR ATHERECTOMY  01/07/2018   Procedure: PERIPHERAL VASCULAR ATHERECTOMY;  Surgeon: Wellington Hampshire, MD;  Location: [place] CV LAB;  Service: Cardiovascular;;  . PERIPHERAL VASCULAR BALLOON ANGIOPLASTY  01/07/2018   Procedure: PERIPHERAL VASCULAR BALLOON ANGIOPLASTY;  Surgeon: Wellington Hampshire, MD;  Location: [place] [facility];  Service: Cardiovascular;;  . PERIPHERAL VASCULAR CATHETERIZATION N/A 02/14/2016   Procedure: Abdominal Aortogram w/Lower Extremity;  Surgeon: Wellington Hampshire, MD;  Location: [place] CV LAB;  Service: Cardiovascular;  Laterality: N/A;  . POSTERIOR LAMINECTOMY / DECOMPRESSION LUMBAR SPINE  2006   "bone spurs"    MEDICATIONS: . albuterol (PROVENTIL) (2.5 MG/3ML) 0.083% nebulizer solution  . amLODipine (NORVASC) 5 MG tablet  . aspirin EC 81 MG tablet  . atorvastatin (LIPITOR) 40 MG tablet  . Blood Glucose Monitoring Suppl (ONETOUCH VERIO) w/Device KIT  . clopidogrel (PLAVIX) 75 MG tablet  . diphenhydrAMINE (BENADRYL) 50 MG tablet  . fexofenadine (ALLEGRA ALLERGY) 180 MG tablet  . Fluticasone-Umeclidin-Vilant (TRELEGY ELLIPTA) 100-62.5-25 MCG/INH AEPB  . Fluticasone-Umeclidin-Vilant (TRELEGY ELLIPTA) 100-62.5-25 MCG/INH AEPB  . nitroGLYCERIN (NITROSTAT) 0.4 MG SL tablet  . Omega-3 Fatty Acids (FISH OIL) 1000 MG CAPS  . omeprazole (PRILOSEC) 40 MG capsule  . ONE TOUCH LANCETS  MISC  . ONETOUCH VERIO test strip  . OVER THE COUNTER MEDICATION  . PROAIR HFA 108 (90 Base) MCG/ACT inhaler  . traZODone (DESYREL) 100 MG tablet   No current facility-administered medications for this encounter.     George Hugh [facility] Short Stay Center/Anesthesiology Phone [phone] 02/19/2018 4:35 PM

## 2018-02-18 NOTE — Telephone Encounter (Signed)
Fwd cardiology for clearance see below

## 2018-02-19 LAB — HEMOGLOBIN A1C
Hgb A1c MFr Bld: 5.1 % (ref 4.8–5.6)
Mean Plasma Glucose: 99.67 mg/dL

## 2018-02-19 NOTE — Telephone Encounter (Signed)
   Primary Cardiologist: Candee Furbish, MD / Fletcher [name]  Chart reviewed as part of pre-operative protocol coverage. H/o CAD s/p PCI last 2017, PVD s/p LE bypass previously with PTA 01/07/18, DM, HLD, tobacco. Most recent OV was with Dr. Fletcher [name] with PV angio and intervention on 10/23 so will forward to Dr. Fletcher [name] for input on holding Plavix. Dr. Fletcher [name] - Please route response to P CV DIV PREOP (the pre-op pool, not me directly). Thank you.  Charlie Pitter, PA-C 02/19/2018, 5:09 PM

## 2018-02-19 NOTE — Anesthesia Preprocedure Evaluation (Addendum)
Anesthesia Evaluation  Patient identified by MRN, date of birth, ID band Patient awake    Reviewed: Allergy & Precautions, H&P , NPO status , Patient's Chart, lab work & pertinent test results  Airway Mallampati: II   Neck ROM: full    Dental   Pulmonary COPD, Current Smoker,    breath sounds clear to auscultation       Cardiovascular hypertension, + CAD, + Cardiac Stents and + Peripheral Vascular Disease   Rhythm:regular Rate:Normal     Neuro/Psych  Neuromuscular disease    GI/Hepatic GERD  ,  Endo/Other  diabetes, Type 2  Renal/GU      Musculoskeletal  (+) Arthritis ,   Abdominal   Peds  Hematology   Anesthesia Other Findings   Reproductive/Obstetrics                            Anesthesia Physical Anesthesia Plan  ASA: III  Anesthesia Plan: General   Post-op Pain Management:    Induction: Intravenous  PONV Risk Score and Plan: 1 and Ondansetron, Midazolam, Dexamethasone and Treatment may vary due to age or medical condition  Airway Management Planned: Oral ETT  Additional Equipment:   Intra-op Plan:   Post-operative Plan: Extubation in OR  Informed Consent: I have reviewed the patients History and Physical, chart, labs and discussed the procedure including the risks, benefits and alternatives for the proposed anesthesia with the patient or authorized representative who has indicated his/her understanding and acceptance.   Dental advisory given  Plan Discussed with: CRNA, Anesthesiologist and Surgeon  Anesthesia Plan Comments: (PAT note written 02/19/2018 by Myra Gianotti, PA-C. )       Anesthesia Quick Evaluation

## 2018-02-20 ENCOUNTER — Telehealth: Payer: Self-pay | Admitting: Pulmonary Disease

## 2018-02-20 NOTE — Telephone Encounter (Signed)
Dr. Valeta Harms, please advise if it must be for 7 days prior to the bronch which is scheduled 12/11, especially with today being 12/6 as from today, it would not be 7 days that pt is to hold the plavix if he had not been told this before now.

## 2018-02-20 NOTE — Telephone Encounter (Signed)
Left VM on cell phone to call back.

## 2018-02-20 NOTE — Telephone Encounter (Signed)
Myra Gianotti PA from [facility] returning [facility] call.  Her phone number is [phone].

## 2018-02-20 NOTE — Telephone Encounter (Signed)
   Primary Cardiologist: Candee Furbish, MD  Chart reviewed as part of pre-operative protocol coverage. Patient was contacted 02/20/2018 in reference to pre-operative risk assessment for pending surgery as outlined below.  Joshua [name] was last seen on 01/27/18 by Dr. Fletcher [name].  Since that day, Joshua [name] has done well with no new cardiac complaints.  Therefore, based on ACC/AHA guidelines, the patient would be at acceptable risk for the planned procedure without further cardiovascular testing.   Per Dr. Fletcher [name] Plavix can be held for 5 days prior to the procedure and should be resumed 1 day after.   I will route this recommendation to the requesting party via Epic fax function and remove from pre-op pool.  Please call with questions.  Daune Perch, NP 02/20/2018, 2:06 PM

## 2018-02-20 NOTE — Telephone Encounter (Signed)
Plavix can be held 5 days before the procedure and should be resumed 1 day after.

## 2018-02-20 NOTE — Telephone Encounter (Signed)
PCCM:  ASA is fine to continue. MUST HOLD PLAVIX.    Garner Nash, DO [place] [facility] 02/20/2018 2:19 PM

## 2018-02-20 NOTE — Telephone Encounter (Signed)
Dr. Valeta Harms, please advise on this for pt and Anesthesia with [facility]. Thanks!

## 2018-02-20 NOTE — Telephone Encounter (Signed)
PCCM:  Thank you for letting me know.  [facility] [facility] 02/20/2018 5:12 PM

## 2018-02-20 NOTE — Telephone Encounter (Signed)
Spoke with Dr. Valeta Harms. Per Dr. Valeta Harms, he told pt at the [place] that he needed to hold the plavix at least 5 days prior to the procedure and Dr. Valeta Harms also stated that he called and spoke with pt and stated this to him as well.  Called and spoke with Ebony Hail who stated pt came in to see Guinevere Ferrari, RN and pt stated that he stopped the plavix 12/1 and this was on 02/18/18 when he came in for pre procedure when he told them this. Ebony Hail also stated that pt was told to hold the aspirin as well but I stated to Ebony Hail that it was just the plavix that pt was supposed to stop but it was fine for him to continue taking the aspirin.  Called and spoke with pt. Pt stated he had stopped the plavix 02/15/18 and pt also stated he did also stop the aspirin as he was told by the people at the hospital when he went for the pre-procedure that he had to stop the aspirin as well.  I stated to pt that Dr. Valeta Harms said it would be okay for him to continue the aspirin but due to what he was told by the pre-procedure staff at hospital he is probably going to hold off on taking aspirin as well as plavix.  Routing to Dr. Valeta Harms as an [place] letting him know that all is good with plavix that pt did stop it.

## 2018-02-24 NOTE — Telephone Encounter (Signed)
Surgical clearance received for Bronch. OK to stop plavix prior to procedure. Forms placed upfront for scan. Nothing further is needed at this time.

## 2018-02-25 ENCOUNTER — Encounter (HOSPITAL_COMMUNITY): Payer: Self-pay | Admitting: Urology

## 2018-02-25 ENCOUNTER — Ambulatory Visit (HOSPITAL_COMMUNITY): Payer: Medicare Other

## 2018-02-25 ENCOUNTER — Ambulatory Visit (HOSPITAL_COMMUNITY)
Admission: RE | Admit: 2018-02-25 | Discharge: 2018-02-25 | Disposition: A | Discharge status: Home / Self Care | Payer: Medicare Other | Source: Ambulatory Visit | Attending: Pulmonary Disease | Admitting: Pulmonary Disease

## 2018-02-25 ENCOUNTER — Ambulatory Visit (HOSPITAL_COMMUNITY): Payer: Medicare Other | Admitting: Physician Assistant

## 2018-02-25 ENCOUNTER — Telehealth: Payer: Self-pay | Admitting: Internal Medicine

## 2018-02-25 ENCOUNTER — Ambulatory Visit (HOSPITAL_COMMUNITY): Payer: Medicare Other | Admitting: Vascular Surgery

## 2018-02-25 ENCOUNTER — Encounter (HOSPITAL_COMMUNITY)
Admission: RE | Disposition: A | Discharge status: Home / Self Care | Payer: Self-pay | Source: Ambulatory Visit | Attending: Pulmonary Disease

## 2018-02-25 DIAGNOSIS — R9389 Abnormal findings on diagnostic imaging of other specified body structures: Secondary | ICD-10-CM

## 2018-02-25 DIAGNOSIS — Z7982 Long term (current) use of aspirin: Secondary | ICD-10-CM | POA: Insufficient documentation

## 2018-02-25 DIAGNOSIS — E785 Hyperlipidemia, unspecified: Secondary | ICD-10-CM | POA: Insufficient documentation

## 2018-02-25 DIAGNOSIS — R918 Other nonspecific abnormal finding of lung field: Secondary | ICD-10-CM | POA: Diagnosis not present

## 2018-02-25 DIAGNOSIS — Z9889 Other specified postprocedural states: Secondary | ICD-10-CM

## 2018-02-25 DIAGNOSIS — G709 Myoneural disorder, unspecified: Secondary | ICD-10-CM | POA: Diagnosis not present

## 2018-02-25 DIAGNOSIS — I2510 Atherosclerotic heart disease of native coronary artery without angina pectoris: Secondary | ICD-10-CM | POA: Diagnosis not present

## 2018-02-25 DIAGNOSIS — K219 Gastro-esophageal reflux disease without esophagitis: Secondary | ICD-10-CM | POA: Insufficient documentation

## 2018-02-25 DIAGNOSIS — Z7951 Long term (current) use of inhaled steroids: Secondary | ICD-10-CM | POA: Diagnosis not present

## 2018-02-25 DIAGNOSIS — J449 Chronic obstructive pulmonary disease, unspecified: Secondary | ICD-10-CM | POA: Diagnosis not present

## 2018-02-25 DIAGNOSIS — J984 Other disorders of lung: Secondary | ICD-10-CM | POA: Diagnosis not present

## 2018-02-25 DIAGNOSIS — Z7902 Long term (current) use of antithrombotics/antiplatelets: Secondary | ICD-10-CM | POA: Diagnosis not present

## 2018-02-25 DIAGNOSIS — F17210 Nicotine dependence, cigarettes, uncomplicated: Secondary | ICD-10-CM | POA: Diagnosis not present

## 2018-02-25 DIAGNOSIS — R846 Abnormal cytological findings in specimens from respiratory organs and thorax: Secondary | ICD-10-CM | POA: Diagnosis not present

## 2018-02-25 DIAGNOSIS — C3490 Malignant neoplasm of unspecified part of unspecified bronchus or lung: Secondary | ICD-10-CM

## 2018-02-25 DIAGNOSIS — R911 Solitary pulmonary nodule: Secondary | ICD-10-CM | POA: Diagnosis not present

## 2018-02-25 DIAGNOSIS — I10 Essential (primary) hypertension: Secondary | ICD-10-CM | POA: Insufficient documentation

## 2018-02-25 DIAGNOSIS — Z79899 Other long term (current) drug therapy: Secondary | ICD-10-CM | POA: Insufficient documentation

## 2018-02-25 DIAGNOSIS — C3411 Malignant neoplasm of upper lobe, right bronchus or lung: Secondary | ICD-10-CM | POA: Insufficient documentation

## 2018-02-25 DIAGNOSIS — Z86718 Personal history of other venous thrombosis and embolism: Secondary | ICD-10-CM | POA: Diagnosis not present

## 2018-02-25 DIAGNOSIS — E1151 Type 2 diabetes mellitus with diabetic peripheral angiopathy without gangrene: Secondary | ICD-10-CM | POA: Diagnosis not present

## 2018-02-25 DIAGNOSIS — Z419 Encounter for procedure for purposes other than remedying health state, unspecified: Secondary | ICD-10-CM

## 2018-02-25 DIAGNOSIS — Z955 Presence of coronary angioplasty implant and graft: Secondary | ICD-10-CM | POA: Diagnosis not present

## 2018-02-25 HISTORY — DX: Other disorders of lung: J98.4

## 2018-02-25 HISTORY — PX: VIDEO BRONCHOSCOPY WITH ENDOBRONCHIAL ULTRASOUND: SHX6177

## 2018-02-25 HISTORY — PX: VIDEO BRONCHOSCOPY WITH ENDOBRONCHIAL NAVIGATION: SHX6175

## 2018-02-25 HISTORY — DX: Malignant neoplasm of unspecified part of unspecified bronchus or lung: C34.90

## 2018-02-25 LAB — GLUCOSE, CAPILLARY
Glucose-Capillary: 103 mg/dL — ABNORMAL HIGH (ref 70–99)
Glucose-Capillary: 101 mg/dL — ABNORMAL HIGH (ref 70–99)

## 2018-02-25 SURGERY — BRONCHOSCOPY, WITH EBUS
Anesthesia: General | Laterality: Right

## 2018-02-25 MED ORDER — ONDANSETRON HCL 4 MG/2ML IJ SOLN: 4.0000 mg | Freq: Four times a day (QID) | INTRAMUSCULAR | Status: DC | PRN

## 2018-02-25 MED ORDER — ROCURONIUM BROMIDE 50 MG/5ML IV SOSY
PREFILLED_SYRINGE | INTRAVENOUS | Status: DC | PRN
  Administered 2018-02-25: 20 mg via INTRAVENOUS
  Administered 2018-02-25: 50 mg via INTRAVENOUS

## 2018-02-25 MED ORDER — PHENYLEPHRINE HCL 10 MG/ML IJ SOLN
INTRAMUSCULAR | Status: DC | PRN
  Administered 2018-02-25: 40 ug via INTRAVENOUS
  Administered 2018-02-25: 80 ug via INTRAVENOUS
  Administered 2018-02-25: 120 ug via INTRAVENOUS
  Administered 2018-02-25: 80 ug via INTRAVENOUS
  Administered 2018-02-25: 40 ug via INTRAVENOUS

## 2018-02-25 MED ORDER — LIDOCAINE 2% (20 MG/ML) 5 ML SYRINGE
INTRAMUSCULAR | Status: AC
  Filled 2018-02-25: qty 5

## 2018-02-25 MED ORDER — ALBUTEROL SULFATE HFA 108 (90 BASE) MCG/ACT IN AERS
INHALATION_SPRAY | RESPIRATORY_TRACT | Status: DC | PRN
  Administered 2018-02-25: 2 via RESPIRATORY_TRACT

## 2018-02-25 MED ORDER — PROPOFOL 10 MG/ML IV BOLUS
INTRAVENOUS | Status: AC
  Filled 2018-02-25: qty 20

## 2018-02-25 MED ORDER — DEXAMETHASONE SODIUM PHOSPHATE 10 MG/ML IJ SOLN
INTRAMUSCULAR | Status: AC
  Filled 2018-02-25: qty 1

## 2018-02-25 MED ORDER — ONDANSETRON HCL 4 MG/2ML IJ SOLN
INTRAMUSCULAR | Status: AC
  Filled 2018-02-25: qty 2

## 2018-02-25 MED ORDER — FENTANYL CITRATE (PF) 250 MCG/5ML IJ SOLN
INTRAMUSCULAR | Status: AC
  Filled 2018-02-25: qty 5

## 2018-02-25 MED ORDER — PHENYLEPHRINE 40 MCG/ML (10ML) SYRINGE FOR IV PUSH (FOR BLOOD PRESSURE SUPPORT)
PREFILLED_SYRINGE | INTRAVENOUS | Status: AC
  Filled 2018-02-25: qty 10

## 2018-02-25 MED ORDER — OXYCODONE HCL 5 MG/5ML PO SOLN: 5.0000 mg | Freq: Once | ORAL | Status: DC | PRN

## 2018-02-25 MED ORDER — DEXAMETHASONE SODIUM PHOSPHATE 10 MG/ML IJ SOLN
INTRAMUSCULAR | Status: DC | PRN
  Administered 2018-02-25: 10 mg via INTRAVENOUS

## 2018-02-25 MED ORDER — ROCURONIUM BROMIDE 50 MG/5ML IV SOSY
PREFILLED_SYRINGE | INTRAVENOUS | Status: AC
  Filled 2018-02-25: qty 5

## 2018-02-25 MED ORDER — SUGAMMADEX SODIUM 200 MG/2ML IV SOLN
INTRAVENOUS | Status: DC | PRN
  Administered 2018-02-25: 189.6 mg via INTRAVENOUS

## 2018-02-25 MED ORDER — EPHEDRINE SULFATE 50 MG/ML IJ SOLN
INTRAMUSCULAR | Status: DC | PRN
  Administered 2018-02-25: 10 mg via INTRAVENOUS

## 2018-02-25 MED ORDER — FENTANYL CITRATE (PF) 100 MCG/2ML IJ SOLN: 25.0000 ug | INTRAMUSCULAR | Status: DC | PRN

## 2018-02-25 MED ORDER — LACTATED RINGERS IV SOLN
INTRAVENOUS | Status: DC | PRN
  Administered 2018-02-25 (×2): via INTRAVENOUS

## 2018-02-25 MED ORDER — GLYCOPYRROLATE 0.2 MG/ML IJ SOLN
INTRAMUSCULAR | Status: DC | PRN
  Administered 2018-02-25 (×2): 0.1 mg via INTRAVENOUS

## 2018-02-25 MED ORDER — OXYCODONE HCL 5 MG PO TABS: 5.0000 mg | ORAL_TABLET | Freq: Once | ORAL | Status: DC | PRN

## 2018-02-25 MED ORDER — FENTANYL CITRATE (PF) 250 MCG/5ML IJ SOLN
INTRAMUSCULAR | Status: DC | PRN
  Administered 2018-02-25: 50 ug via INTRAVENOUS
  Administered 2018-02-25: 100 ug via INTRAVENOUS

## 2018-02-25 MED ORDER — PROPOFOL 10 MG/ML IV BOLUS
INTRAVENOUS | Status: DC | PRN
  Administered 2018-02-25: 150 mg via INTRAVENOUS

## 2018-02-25 MED ORDER — MIDAZOLAM HCL 2 MG/2ML IJ SOLN
INTRAMUSCULAR | Status: AC
  Filled 2018-02-25: qty 2

## 2018-02-25 MED ORDER — ALBUTEROL SULFATE HFA 108 (90 BASE) MCG/ACT IN AERS
INHALATION_SPRAY | RESPIRATORY_TRACT | Status: AC
  Filled 2018-02-25: qty 6.7

## 2018-02-25 MED ORDER — EPHEDRINE 5 MG/ML INJ
INTRAVENOUS | Status: AC
  Filled 2018-02-25: qty 10

## 2018-02-25 MED ORDER — LIDOCAINE 2% (20 MG/ML) 5 ML SYRINGE
INTRAMUSCULAR | Status: DC | PRN
  Administered 2018-02-25: 80 mg via INTRAVENOUS

## 2018-02-25 MED ORDER — ONDANSETRON HCL 4 MG/2ML IJ SOLN
INTRAMUSCULAR | Status: DC | PRN
  Administered 2018-02-25: 4 mg via INTRAVENOUS

## 2018-02-25 MED ORDER — MIDAZOLAM HCL 2 MG/2ML IJ SOLN
INTRAMUSCULAR | Status: DC | PRN
  Administered 2018-02-25: 2 mg via INTRAVENOUS

## 2018-02-25 SURGICAL SUPPLY — 50 items
ADAPTER BRONCHOSCOPE OLYMPUS (ADAPTER) ×3 IMPLANT
ADAPTER VALVE BIOPSY EBUS (MISCELLANEOUS) ×2 IMPLANT
ADPTR VALVE BIOPSY EBUS (MISCELLANEOUS) ×1
BRUSH CYTOL CELLEBRITY 1.5X140 (MISCELLANEOUS) IMPLANT
BRUSH SUPERTRAX BIOPSY (INSTRUMENTS) IMPLANT
BRUSH SUPERTRAX NDL-TIP CYTO (INSTRUMENTS) ×3 IMPLANT
CANISTER SUCT 3000ML PPV (MISCELLANEOUS) ×3 IMPLANT
CHANNEL WORK EXTEND EDGE 180 (KITS) IMPLANT
CHANNEL WORK EXTEND EDGE 90 (KITS) IMPLANT
CONT SPEC 4OZ CLIKSEAL STRL BL (MISCELLANEOUS) ×9 IMPLANT
COVER BACK TABLE 60X90IN (DRAPES) ×3 IMPLANT
COVER WAND RF STERILE (DRAPES) ×3 IMPLANT
FILTER STRAW FLUID ASPIR (MISCELLANEOUS) IMPLANT
FORCEPS BIOP RJ4 1.8 (CUTTING FORCEPS) IMPLANT
FORCEPS BIOP SUPERTRX PREMAR (INSTRUMENTS) ×3 IMPLANT
GAUZE SPONGE 4X4 12PLY STRL (GAUZE/BANDAGES/DRESSINGS) ×3 IMPLANT
GLOVE BIO SURGEON STRL SZ7.5 (GLOVE) ×6 IMPLANT
GLOVE SURG SS PI 7.5 STRL IVOR (GLOVE) ×3 IMPLANT
GOWN STRL REUS W/ TWL LRG LVL3 (GOWN DISPOSABLE) ×4 IMPLANT
GOWN STRL REUS W/TWL LRG LVL3 (GOWN DISPOSABLE) ×2
KIT CLEAN ENDO COMPLIANCE (KITS) ×6 IMPLANT
KIT GUIDESHEATH 2.0 (SHEATH) ×3 IMPLANT
KIT LOCATABLE GUIDE (CANNULA) IMPLANT
KIT MARKER FIDUCIAL DELIVERY (KITS) IMPLANT
KIT PROCEDURE EDGE 180 (KITS) ×3 IMPLANT
KIT PROCEDURE EDGE 90 (KITS) IMPLANT
KIT TURNOVER KIT B (KITS) ×3 IMPLANT
MARKER SKIN DUAL TIP RULER LAB (MISCELLANEOUS) ×3 IMPLANT
NEEDLE ASPIRATION VIZISHOT 19G (NEEDLE) IMPLANT
NEEDLE ASPIRATION VIZISHOT 21G (NEEDLE) IMPLANT
NEEDLE SUPERTRX PREMARK BIOPSY (NEEDLE) ×6 IMPLANT
NS IRRIG 1000ML POUR BTL (IV SOLUTION) ×3 IMPLANT
OIL SILICONE PENTAX (PARTS (SERVICE/REPAIRS)) ×3 IMPLANT
PAD ARMBOARD 7.5X6 YLW CONV (MISCELLANEOUS) ×6 IMPLANT
PATCHES PATIENT (LABEL) ×12 IMPLANT
SLEEVE SCD COMPRESS KNEE MED (MISCELLANEOUS) ×3 IMPLANT
STOPCOCK 4 WAY LG BORE MALE ST (IV SETS) ×3 IMPLANT
SYR 20CC LL (SYRINGE) ×3 IMPLANT
SYR 20ML ECCENTRIC (SYRINGE) ×9 IMPLANT
SYR 3ML LL SCALE MARK (SYRINGE) IMPLANT
SYR 50ML SLIP (SYRINGE) ×3 IMPLANT
SYR 5ML LL (SYRINGE) ×3 IMPLANT
TOWEL OR 17X24 6PK STRL BLUE (TOWEL DISPOSABLE) ×3 IMPLANT
TRAP SPECIMEN MUCOUS 40CC (MISCELLANEOUS) IMPLANT
TUBE CONNECTING 20X1/4 (TUBING) ×3 IMPLANT
UNDERPAD 30X30 (UNDERPADS AND DIAPERS) ×3 IMPLANT
VALVE BIOPSY  SINGLE USE (MISCELLANEOUS) ×1
VALVE BIOPSY SINGLE USE (MISCELLANEOUS) ×2 IMPLANT
VALVE SUCTION BRONCHIO DISP (MISCELLANEOUS) ×3 IMPLANT
WATER STERILE IRR 1000ML POUR (IV SOLUTION) ×3 IMPLANT

## 2018-02-25 NOTE — Transfer of Care (Signed)
Immediate Anesthesia Transfer of Care Note  Patient: Joshua [name]  Procedure(s) Performed: VIDEO BRONCHOSCOPY WITH ENDOBRONCHIAL ULTRASOUND (Right ) VIDEO BRONCHOSCOPY WITH ENDOBRONCHIAL NAVIGATION (Right )  Patient Location: PACU  Anesthesia Type:General  Level of Consciousness: awake, alert  and patient cooperative  Airway & Oxygen Therapy: Patient Spontanous Breathing and Patient connected to face mask oxygen  Post-op Assessment: Report given to RN and Post -op Vital signs reviewed and stable  Post vital signs: Reviewed and stable  Last Vitals:  Vitals Value Taken Time  BP 139/69 02/25/2018 10:22 AM  Temp    Pulse 84 02/25/2018 10:24 AM  Resp 25 02/25/2018 10:24 AM  SpO2 100 % 02/25/2018 10:24 AM  Vitals shown include unvalidated device data.  Last Pain:  Vitals:   02/25/18 0645  TempSrc:   PainSc: 7       Patients Stated Pain Goal: 3 (58/83/25 4982)  Complications: No apparent anesthesia complications

## 2018-02-25 NOTE — Telephone Encounter (Signed)
Pt called office requesting his refill for the Omeprazole to be sent over to [facility] service. It was sent to [facility] but he would have to pay about $400.

## 2018-02-25 NOTE — Op Note (Addendum)
Video Bronchoscopy with Electromagnetic Navigation and Radial Endobronchial Ultrasound  Procedure Note  Date of Operation: 02/25/2018  Pre-op Diagnosis: Right upper lobe thin-walled cavitary lesion  Post-op Diagnosis: Right upper lobe thin-walled cavitary lesion  Surgeon: Garner Nash, DO   Assistants: None  Anesthesia: General endotracheal anesthesia  Operation: Flexible video fiberoptic bronchoscopy with electromagnetic navigation and Olympus peripheral radial endobronchial ultrasound-guided biopsies.  Estimated Blood Loss: Minimal, less than 3 cc  Complications: None   Indications and History: Joshua [name] is a 62 y.o. male with longtime tobacco abuse and right upper lobe thin-walled cystic lesion found on lung cancer screening CT.  The risks, benefits, complications, treatment options and expected outcomes were discussed with the patient.  The possibilities of pneumothorax, pneumonia, reaction to medication, pulmonary aspiration, perforation of a viscus, bleeding, failure to diagnose a condition and creating a complication requiring transfusion or operation were discussed with the patient who freely signed the consent.    Description of Procedure: The patient was seen in the Preoperative Area, was examined and was deemed appropriate to proceed.  The patient was taken to [facility] OR 10, identified as Joshua [name] and the procedure verified as Flexible Video Fiberoptic Bronchoscopy with the assistance of electromagnetic navigational bronchoscopy, super D imaging as well as Olympus radial endobronchial ultrasound.  A Time Out was held and the above information confirmed.   Prior to the date of the procedure a super D high-resolution CT scan of the chest was performed. Utilizing [place] a virtual tracheobronchial tree was generated to allow the creation of distinct navigation pathways to the patient's parenchymal abnormalities. After being taken to the operating  room general anesthesia was initiated and the patient was orally intubated. The video fiberoptic bronchoscope was introduced via the endotracheal tube and a general inspection was performed which showed bronchiectatic distal openings with no evidence of endobronchial lesion and there was scattered pitting and striations through the airways. The extendable working channel and locator guide were introduced into the bronchoscope. The distinct navigation pathways prepared prior to this procedure were then utilized to navigate to within 0.8 mm of patient's right upper lobe lesion identified on CT scan. The extendable working channel was secured into place and the locator guide was withdrawn. Under fluoroscopic guidance transbronchial needle brushings, transbronchial needle aspirations, and transbronchial forceps biopsies were performed to be sent for cytology and pathology. A bronchioalveolar lavage was performed in the right upper lobe and sent for cytology.   Following completion of the electromagnetic navigational bronchoscopy we transition to use of the Olympus radial endobronchial ultrasound probe.  The radial probe sheath was introduced into the right upper lobe.  Under fluoroscopic guidance we approximated the location within the airway.  Similar to the fluoroscopic location as defined by the navigational software previously.  We introduced to the radial probe.  Once distal to the end of the radial probe sheath the probe was activated.  Under real-time visualization there was a distinct irregular lesion identified.  The sheath was withdrawn proximally to the ultrasound probe lesional identification site under fluoroscopy.  The radial probe was then removed from the sheath and using the stopper on the sheath was secured at the entry point of the bronchoscope.  From there we completed cytology brushing samples as well as forcep brushing samples.  At the conclusion of sampling we completed a BAL to the right upper  lobe and extracted it through the radial probe sheath.  At the end of the procedure a general airway  inspection was performed and there was no evidence of active bleeding. The bronchoscope was removed.  There was a total of 5 minutes of fluoroscopy time. The patient tolerated the procedure well. There was no significant blood loss and there were no obvious complications. A post-procedural chest x-ray is pending.  Samples: 1.  ENB transbronchial needle brushings from right upper lobe 2.  ENB transbronchial Wang needle biopsies from right upper lobe 3.  ENB transbronchial forceps biopsies from right upper lobe 4. Bronchoalveolar lavage from right upper lobe 5.  Radial endobronchial ultrasound, transbronchial cytology brushings from right upper lobe 6.  Radial endobronchial ultrasound, forcep biopsies from the right upper lobe  Preliminary pathology: Adequate for diagnosis  Plans:  The patient will be discharged from the PACU to home when recovered from anesthesia and after chest x-ray is reviewed. We will review the cytology and pathology results with the patient when they become available. Outpatient followup will be with Joshua [name]. This will be scheduled within 1-2 weeks.    Garner Nash, DO [place] [facility] 02/25/2018 10:12 AM  Personal pager: [phone] If unanswered, please page CCM On-call: [phone]

## 2018-02-25 NOTE — Interval H&P Note (Signed)
History and Physical Interval Note:  02/25/2018 8:12 AM  Joshua [name]  has presented today for surgery, with the diagnosis of RIGHT UPPER LOBE LUNG CAVITARY LESION  The various methods of treatment have been discussed with the patient and family. After consideration of risks, benefits and other options for treatment, the patient has consented to  Procedure(s): VIDEO BRONCHOSCOPY WITH ENDOBRONCHIAL ULTRASOUND (Right) VIDEO BRONCHOSCOPY WITH ENDOBRONCHIAL NAVIGATION (Right) as a surgical intervention .  The patient's history has been reviewed, patient examined, no change in status, stable for surgery.  I have reviewed the patient's chart and labs.  Questions were answered to the patient's satisfaction.    Patient seen and examined in per-op. All questions answered. No barriers to proceed. Plavix stopped 02/15/2018.  Garner Nash, DO [facility] 02/25/2018 8:13 AM  Personal pager: [phone] If unanswered, please page CCM On-call: [phone]

## 2018-02-25 NOTE — Telephone Encounter (Signed)
That is because we are not the one who filled the Rx, GI prescribed Rx and he will need to talk to their office in reference to sending in the Rx

## 2018-02-25 NOTE — Anesthesia Procedure Notes (Signed)
Procedure Name: Intubation Date/Time: 02/25/2018 8:35 AM Performed by: Kathryne Hitch, CRNA Pre-anesthesia Checklist: Patient identified, Emergency Drugs available, Patient being monitored, Timeout performed and Suction available Patient Re-evaluated:Patient Re-evaluated prior to induction Oxygen Delivery Method: Circle system utilized Preoxygenation: Pre-oxygenation with 100% oxygen Induction Type: IV induction Ventilation: Mask ventilation without difficulty and Oral airway inserted - appropriate to patient size Laryngoscope Size: Mac and 4 Grade View: Grade I Tube type: Oral Tube size: 8.5 mm Number of attempts: 1 Placement Confirmation: ETT inserted through vocal cords under direct vision,  positive ETCO2 and breath sounds checked- equal and bilateral Secured at: 23 cm Tube secured with: Tape Dental Injury: Teeth and Oropharynx as per pre-operative assessment

## 2018-02-25 NOTE — Discharge Instructions (Signed)
Flexible Bronchoscopy, Care After These instructions give you information on caring for yourself after your procedure. Your doctor may also give you more specific instructions. Call your doctor if you have any problems or questions after your procedure. Follow these instructions at home:  The day after the test, you may eat your normal diet.  You may do your normal activities.  Keep all doctor visits. Get help right away if:  You get more and more short of breath.  You get light-headed.  You feel like you are going to pass out (faint).  You have chest pain.  You have new problems that worry you.  You cough up more than a little blood.  You cough up more blood than before. This information is not intended to replace advice given to you by your health care provider. Make sure you discuss any questions you have with your health care provider.  Document Released: 12/30/2008 Document Revised: 08/10/2015 Document Reviewed: 11/06/2012 Elsevier Interactive Patient Education  2017 [employer].

## 2018-02-26 ENCOUNTER — Other Ambulatory Visit: Payer: Self-pay | Admitting: Internal Medicine

## 2018-02-26 ENCOUNTER — Encounter (HOSPITAL_COMMUNITY): Payer: Self-pay | Admitting: Pulmonary Disease

## 2018-02-26 NOTE — Anesthesia Postprocedure Evaluation (Signed)
Anesthesia Post Note  Patient: Joshua [name]  Procedure(s) Performed: VIDEO BRONCHOSCOPY WITH ENDOBRONCHIAL ULTRASOUND (Right ) VIDEO BRONCHOSCOPY WITH ENDOBRONCHIAL NAVIGATION (Right ) VIDEO BRONCHOSCOPY WITH RADIAL ENDOBRONCHIAL ULTRASOUND     Patient location during evaluation: PACU Anesthesia Type: General Level of consciousness: awake and alert Pain management: pain level controlled Vital Signs Assessment: post-procedure vital signs reviewed and stable Respiratory status: spontaneous breathing, nonlabored ventilation, respiratory function stable and patient connected to nasal cannula oxygen Cardiovascular status: blood pressure returned to baseline and stable Postop Assessment: no apparent nausea or vomiting Anesthetic complications: no    Last Vitals:  Vitals:   02/25/18 1125 02/25/18 1140  BP: 128/75 129/75  Pulse: 69   Resp: 18   Temp: (!) 36.4 C (!) 36.4 C  SpO2: 94% 94%    Last Pain:  Vitals:   02/25/18 1140  TempSrc:   PainSc: 0-No pain                 Darlena Koval S

## 2018-03-01 DIAGNOSIS — J449 Chronic obstructive pulmonary disease, unspecified: Secondary | ICD-10-CM | POA: Diagnosis not present

## 2018-03-02 ENCOUNTER — Encounter: Payer: Self-pay | Admitting: Pulmonary Disease

## 2018-03-02 ENCOUNTER — Ambulatory Visit: Payer: Medicare Other | Admitting: Pulmonary Disease

## 2018-03-02 VITALS — BP 138/92 | HR 74 | Ht 69.0 in | Wt 208.0 lb

## 2018-03-02 DIAGNOSIS — Z87891 Personal history of nicotine dependence: Secondary | ICD-10-CM | POA: Diagnosis not present

## 2018-03-02 DIAGNOSIS — C3411 Malignant neoplasm of upper lobe, right bronchus or lung: Secondary | ICD-10-CM | POA: Diagnosis not present

## 2018-03-02 DIAGNOSIS — J449 Chronic obstructive pulmonary disease, unspecified: Secondary | ICD-10-CM | POA: Diagnosis not present

## 2018-03-02 NOTE — Progress Notes (Signed)
Synopsis: Referred in Oct. 2019 for COPD by Jearld Fenton, NP  Subjective:   PATIENT ID: Joshua [name] GENDER: male DOB: [birth year], MRN: [id number]  Chief Complaint  Patient presents with  . Follow-up    States he is not feeling well today. States he is aching all day. States his plavix make his joints sore. States he is still intermittently coughing up blood but it has improved.     PMH of tobacco abuse, smoked since age of 23, at heaviest was 1ppd and now down to 0.5ppd. History of back surgery complicated by DVT. He has planned intervention by cardiology for his PVD on his right leg in two weeks.  He was treated with antibiotics and steroids recently by his primary care provider.  He is recently at the end of his steroid taper.  Presents to the office today with significant dyspnea on exertion, congestion, cough, sputum production as well as chest tightness and wheezing.  He has difficulty getting up and walking from his chair to the bathroom.  He states that he received a breathing treatment by his primary care doctor and felt much better for several hours after that.  He is still continuing to smoke and he knows that he needs to quit but he has been unable to stop. He has been incarcerated x2 each time for a little less than a year.  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  He denies IV drug use history.  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  OV 02/05/2018: Since the patient was last seen in the office he has been doing much better.  He has started using his trilogy inhaler regularly.  He has cut down smoking.  He is only smoking 3 to 4 cigarettes/day.  He is definitely trying to quit.  Since he was last seen in the office we also completed a low-dose lung cancer screening CT which was read as a lung RADS for a.  This had a enlarging right upper lobe cavitary/cystic lesion with areas of associated groundglass and semisolid material.  He does  have dyspnea on exertion.  His daily cough is somewhat better.  He still has sputum production.  OV 03/02/2018: Since our last office visit the patient was taken for bronchoscopy on 12/11, ENB/Radial EBUS s/p sampling of RUL lesion + for pulmonary adenocarcinoma, TTF-1 and Napsin positive.  Since he was last seen he also had pulmonary function tests completed which demonstrated an FEV1 60% predicted postbronchodilator, 2.0 L.  He has stopped smoking as of last week.  Patient denies hemoptysis.  He has been wheezing a little bit and using his nebulized treatments at home.  Today we discussed his biopsy results in the office with his wife as well as patient today in the office.    Past Medical History:  Diagnosis Date  . AC (acromioclavicular) joint bone spurs, unspecified laterality 11/25/2016  . Arthritis    "back" (05/16/2015)  . CAD in native artery, occluded Lcx and rotational atherectomy to LAD with DES 05/17/15 05/18/2015  . Cavitary lesion of lung 02/25/2018  . Chronic back pain    "all my back"  . Chronic bronchitis ([place])   . COPD (chronic obstructive pulmonary disease) ([place])   . Diabetes mellitus without complication ([place])   . GERD (gastroesophageal reflux disease)   . H/O blood clots    "had them in my back; put filter in before one  of my neck ORs"  . History of gout   . Hyperlipidemia   . Hypertension   . Neuromuscular disorder ([place])   . PAD (peripheral artery disease) ([place])   . Phlebitis   . Positive TB test   . Seasonal allergies   . Shortness of breath   . Spinal disease      Family History  Problem Relation Age of Onset  . Stomach cancer Mother        Deceased, 72s  . Hypertension Mother   . Hypertension Father   . Stroke Father   . Heart attack Father        Deceased, 71  . Aneurysm Brother   . Healthy Daughter   . Healthy Maternal Grandmother   . Leukemia Grandchild   . Diabetes Neg Hx   . Early death Neg Hx   . Colon cancer Neg Hx   . Pancreatic cancer Neg  Hx      Past Surgical History:  Procedure Laterality Date  . ABDOMINAL AORTOGRAM W/LOWER EXTREMITY  01/07/2018  . ABDOMINAL AORTOGRAM W/LOWER EXTREMITY N/A 01/07/2018   Procedure: ABDOMINAL AORTOGRAM W/LOWER EXTREMITY;  Surgeon: Wellington Hampshire, MD;  Location: [facility];  Service: Cardiovascular;  Laterality: N/A;  . ANTERIOR CERVICAL DECOMP/DISCECTOMY FUSION  ~ 2001-2009 X 3  . ANTERIOR CERVICAL DECOMP/DISCECTOMY FUSION N/A 10/06/2012   Procedure: ANTERIOR CERVICAL DECOMPRESSION/DISCECTOMY FUSION 1 LEVEL;  Surgeon: [name], MD;  Location: [facility];  Service: Neurosurgery;  Laterality: N/A;  C7T1 anterior cervical decompression with fusion plating and bonegraft   . BACK SURGERY    . CARDIAC CATHETERIZATION  05/16/2015  . CARDIAC CATHETERIZATION N/A 05/16/2015   Procedure: Left Heart Cath and Coronary Angiography;  Surgeon: [name], MD;  Location: [facility];  Service: Cardiovascular;  Laterality: N/A;  . CARDIAC CATHETERIZATION N/A 05/16/2015   Procedure: Coronary Balloon Angioplasty;  Surgeon: [name], MD;  Location: [facility];  Service: Cardiovascular;  Laterality: N/A;  . CARDIAC CATHETERIZATION N/A 05/17/2015   Procedure: Coronary Stent Intervention Rotoblater;  Surgeon: [name], MD;  Location: [facility];  Service: Cardiovascular;  Laterality: N/A;  . ELBOW SURGERY Left 2016   "had bone pieces removed"  . ESOPHAGEAL MANOMETRY N/A 12/30/2016   Procedure: ESOPHAGEAL MANOMETRY (EM);  Surgeon: Irene Shipper, MD;  Location: [facility];  Service: Endoscopy;  Laterality: N/A;  . EXCISIONAL HEMORRHOIDECTOMY    . FEMORAL BYPASS  ~ 2010  . IR RADIOLOGIST EVAL & MGMT  12/17/2016  . IVC FILTER PLACEMENT ([place])  2010   "had blood clots in my back"  . PERIPHERAL VASCULAR ATHERECTOMY  01/07/2018   Procedure: PERIPHERAL VASCULAR ATHERECTOMY;  Surgeon: Wellington Hampshire, MD;  Location: [facility];  Service:  Cardiovascular;;  . PERIPHERAL VASCULAR BALLOON ANGIOPLASTY  01/07/2018   Procedure: PERIPHERAL VASCULAR BALLOON ANGIOPLASTY;  Surgeon: Wellington Hampshire, MD;  Location: [facility];  Service: Cardiovascular;;  . PERIPHERAL VASCULAR CATHETERIZATION N/A 02/14/2016   Procedure: Abdominal Aortogram w/Lower Extremity;  Surgeon: Wellington Hampshire, MD;  Location: [facility];  Service: Cardiovascular;  Laterality: N/A;  . POSTERIOR LAMINECTOMY / DECOMPRESSION LUMBAR SPINE  2006   "bone spurs"  . VIDEO BRONCHOSCOPY WITH ENDOBRONCHIAL NAVIGATION Right 02/25/2018   Procedure: VIDEO BRONCHOSCOPY WITH ENDOBRONCHIAL NAVIGATION;  Surgeon: Garner Nash, DO;  Location: [facility];  Service: Thoracic;  Laterality: Right;  [name] VIDEO BRONCHOSCOPY WITH ENDOBRONCHIAL ULTRASOUND  Right 02/25/2018   Procedure: VIDEO BRONCHOSCOPY WITH ENDOBRONCHIAL ULTRASOUND;  Surgeon: Garner Nash, DO;  Location: [facility];  Service: Thoracic;  Laterality: Right;    Social History   Socioeconomic History  . Marital status: Married    Spouse name: Not on file  . Number of children: Not on file  . Years of education: 7  . Highest education level: Not on file  Occupational History  . Occupation: Disabled    Employer: DISABLED  Social Needs  . Financial resource strain: Not on file  . Food insecurity:    Worry: Not on file    Inability: Not on file  . Transportation needs:    Medical: Not on file    Non-medical: Not on file  Tobacco Use  . Smoking status: Current Every Day Smoker    Packs/day: 0.75    Years: 49.00    Pack years: 36.75    Types: Cigarettes  . Smokeless tobacco: Never Used  . Tobacco comment: half a pack a day 12/25/2017  Substance and Sexual Activity  . Alcohol use: Yes    Alcohol/week: 0.0 standard drinks    Comment: 2019 NO ALCOHOL IN 2 MONTHS  . Drug use: No  . Sexual activity: Not Currently  Lifestyle  . Physical activity:    Days per week: Not on file    Minutes per session: Not  on file  . Stress: Not on file  Relationships  . Social connections:    Talks on phone: Not on file    Gets together: Not on file    Attends religious service: Not on file    Active member of club or organization: Not on file    Attends meetings of clubs or organizations: Not on file    Relationship status: Not on file  . Intimate partner violence:    Fear of current or ex partner: Not on file    Emotionally abused: Not on file    Physically abused: Not on file    Forced sexual activity: Not on file  Other Topics Concern  . Not on file  Social History Narrative   Regular exercise-no   Caffeine Use-yes   Lives with wife in a one story home. Has 2 children.  On disability for low back pain.  Used to work as a Games developer, lasted worked in 2007.     Education: 7th grade.     Allergies  Allergen Reactions  . Lisinopril Hives and Itching  . Amitriptyline Other (See Comments)    Makes pt feel "weird" and nausea.  . Contrast Media [Iodinated Diagnostic Agents] Hives and Itching    Pt can use if taking benadryl   . Ibuprofen Other (See Comments)    Internal bleeding     Outpatient Medications Prior to Visit  Medication Sig Dispense Refill  . albuterol (PROVENTIL) (2.5 MG/3ML) 0.083% nebulizer solution Take 3 mLs (2.5 mg total) by nebulization every 6 (six) hours as needed for wheezing or shortness of breath. 75 mL 5  . amLODipine (NORVASC) 5 MG tablet Take 1 tablet (5 mg total) by mouth daily. 90 tablet 1  . aspirin EC 81 MG tablet Take 81 mg by mouth daily.    [name] atorvastatin (LIPITOR) 40 MG tablet Take 1 tablet (40 mg total) by mouth daily at 6 PM. 90 tablet 1  . Blood Glucose Monitoring Suppl (ONETOUCH VERIO) w/Device KIT 1 Device by Does not apply route once. 1 kit 0  . clopidogrel (PLAVIX) 75 MG  tablet Take 1 tablet (75 mg total) by mouth daily. 90 tablet 3  . diphenhydrAMINE (BENADRYL) 50 MG tablet Take 0.5 tablets (25 mg total) by mouth once. 1 hr prior to CT scan 30 tablet 0    . fexofenadine (ALLEGRA ALLERGY) 180 MG tablet Take 1 tablet (180 mg total) by mouth daily. 90 tablet 3  . Fluticasone-Umeclidin-Vilant (TRELEGY ELLIPTA) 100-62.5-25 MCG/INH AEPB Inhale 1 puff into the lungs daily. (Patient not taking: Reported on 02/16/2018) 28 each 5  . Fluticasone-Umeclidin-Vilant (TRELEGY ELLIPTA) 100-62.5-25 MCG/INH AEPB Inhale 1 puff into the lungs daily. 2 each 0  . nitroGLYCERIN (NITROSTAT) 0.4 MG SL tablet Place 1 tablet (0.4 mg total) under the tongue every 5 (five) minutes as needed for chest pain. 25 tablet 4  . Omega-3 Fatty Acids (FISH OIL) 1000 MG CAPS Take 1,000 mg by mouth daily.     [name] omeprazole (PRILOSEC) 40 MG capsule Take 1 capsule (40 mg total) by mouth daily. PATIENT NEEDS OFFICE VISIT FOR FURTHER REFILLS (Patient not taking: Reported on 02/16/2018) 90 capsule 0  . ONE TOUCH LANCETS MISC 1 each by Does not apply route 3 (three) times daily. 200 each 5  . ONETOUCH VERIO test strip USE TO TEST 3 TIMES DAILY 100 each 1  . OVER THE COUNTER MEDICATION Apply 1 application topically daily as needed (neuropathy pain). Hemp oil cream    . PROAIR HFA 108 (90 Base) MCG/ACT inhaler USE 2 PUFFS EVERY 6 HOURS  AS NEEDED FOR WHEEZING OR  SHORTNESS OF BREATH (Patient taking differently: Inhale 2 puffs into the lungs every 6 (six) hours as needed for wheezing or shortness of breath. ) 34 g 3  . traZODone (DESYREL) 100 MG tablet TAKE 1 TABLET BY MOUTH AT  BEDTIME 90 tablet 1   No facility-administered medications prior to visit.     Review of Systems  Constitutional: Negative for chills, fever, malaise/fatigue and weight loss.  HENT: Negative for hearing loss, sore throat and tinnitus.   Eyes: Negative for blurred vision and double vision.  Respiratory: Positive for cough and wheezing. Negative for hemoptysis, sputum production, shortness of breath and stridor.   Cardiovascular: Negative for chest pain, palpitations, orthopnea, leg swelling and PND.  Gastrointestinal:  Negative for abdominal pain, constipation, diarrhea, heartburn, nausea and vomiting.  Genitourinary: Negative for dysuria, hematuria and urgency.  Musculoskeletal: Negative for joint pain and myalgias.  Skin: Negative for itching and rash.  Neurological: Negative for dizziness, tingling, weakness and headaches.  Endo/Heme/Allergies: Negative for environmental allergies. Does not bruise/bleed easily.  Psychiatric/Behavioral: Negative for depression. The patient is not nervous/anxious and does not have insomnia.   All other systems reviewed and are negative.    Objective:  Physical Exam Vitals signs reviewed.  Constitutional:      General: He is not in acute distress.    Appearance: He is well-developed.  HENT:     Head: Normocephalic and atraumatic.  Eyes:     General: No scleral icterus.    Conjunctiva/sclera: Conjunctivae normal.     Pupils: Pupils are equal, round, and reactive to light.  Neck:     Musculoskeletal: Neck supple.     Vascular: No JVD.     Trachea: No tracheal deviation.  Cardiovascular:     Rate and Rhythm: Normal rate and regular rhythm.     Heart sounds: Normal heart sounds. No murmur.  Pulmonary:     Effort: Pulmonary effort is normal. No tachypnea, accessory muscle usage or respiratory distress.  Breath sounds: No stridor. Wheezing present. No rhonchi or rales.  Abdominal:     General: Bowel sounds are normal. There is no distension.     Palpations: Abdomen is soft.     Tenderness: There is no abdominal tenderness.  Musculoskeletal:        General: No tenderness.  Lymphadenopathy:     Cervical: No cervical adenopathy.  Skin:    General: Skin is warm and dry.     Capillary Refill: Capillary refill takes less than 2 seconds.     Findings: No rash.  Neurological:     Mental Status: He is alert and oriented to person, place, and time.  Psychiatric:        Behavior: Behavior normal.      Vitals:   03/02/18 0852  BP: (!) 138/92  Pulse: 74    SpO2: 95%  Weight: 208 lb (94.3 kg)  Height: _0  (1.753 m)     on RA BMI Readings from Last 3 Encounters:  02/25/18 30.86 kg/m  02/18/18 30.88 kg/m  02/05/18 30.86 kg/m   Wt Readings from Last 3 Encounters:  02/25/18 209 lb (94.8 kg)  02/18/18 209 lb 1.6 oz (94.8 kg)  02/05/18 209 lb (94.8 kg)     CBC    Component Value Date/Time   WBC 11.0 (H) 02/18/2018 0923   RBC 5.31 02/18/2018 0923   HGB 17.2 (H) 02/18/2018 0923   HGB 16.4 01/02/2018 0927   HGB 19.2 (H) 11/25/2016 1430   HCT 52.0 02/18/2018 0923   HCT 46.2 01/02/2018 0927   HCT 54.8 (H) 11/25/2016 1430   PLT 226 02/18/2018 0923   PLT 269 01/02/2018 0927   MCV 97.9 02/18/2018 0923   MCV 97 01/02/2018 0927   MCV 92.3 11/25/2016 1430   MCH 32.4 02/18/2018 0923   MCHC 33.1 02/18/2018 0923   RDW 13.2 02/18/2018 0923   RDW 13.9 01/02/2018 0927   RDW 15.0 (H) 11/25/2016 1430   LYMPHSABS 2.5 11/25/2016 1430   MONOABS 1.3 (H) 11/25/2016 1430   EOSABS 0.3 11/25/2016 1430   BASOSABS 0.0 11/25/2016 1430    Chest Imaging: 2018 CT chest: Right lower lobe 6 mm pulmonary nodule stable from 2014 2018 on prior imaging.  02/16/2018 super D CT chest: Right upper lobe cystic lesion. The patient's images have been independently reviewed by me.   Pulmonary Functions Testing Results:  PFT Results Latest Ref Rng & Units 02/05/2018  FVC-Pre L 3.05  FVC-Predicted Pre % 66  FVC-Post L 3.30  FVC-Predicted Post % 72  Pre FEV1/FVC % % 58  Post FEV1/FCV % % 63  FEV1-Pre L 1.75  FEV1-Predicted Pre % 50  FEV1-Post L 2.09  DLCO UNC% % 75  DLCO COR %Predicted % 82  TLC L 7.59  TLC % Predicted % 111  RV % Predicted % 173     FeNO: None  Pathology:   Bronchoscopy: 02/25/2018 ENB/Radial EBUS  + RUL NSCLC, Adenocarcinoma   Echocardiogram: None   Heart Catheterization:   Prox LAD to Mid LAD lesion, 90% stenosed. Post intervention, there remains 80% residual calcific stenosis.  Mid LAD lesion, 80% stenosed. Dist LAD  lesion, 55% stenosed.  Prox Cx lesion, 100% stenosed. Prox Cx to Mid Cx lesion, 99% stenosed. Ost 2nd Mrg to 2nd Mrg lesion, 99% stenosed. -- Seen via retrograde filling from LAD/diagonal-OM collaterals  There is hyperdynamic left ventricular systolic function.  Elevated LVEDP    Assessment & Plan:   Malignant neoplasm of upper lobe  of right lung ([place]) - Plan: Ambulatory referral to Cardiothoracic Surgery  Moderate COPD (chronic obstructive pulmonary disease) ([place])  Former smoker  Discussion:  Today we reviewed the patient's pathology results from his recent bronchoscopy and his positive cancer diagnosis consistent with pulmonary adenocarcinoma.  Patient is currently taking Plavix and this will need to be held prior to potential procedures if patient is a lobectomy candidate.  His PET scan is negative for any distant metastasis.  Patient was encouraged to stay smoke-free.   Recommend the following: I will refer the patient to [facility], cardiothoracic surgery for evaluation of right upper lobectomy. Pulmonary function tests reviewed FEV1 60% predicted postbronchodilator response, DLCO 75% predicted  Greater than 50% of this patient's 25-minute office visit was spent face-to-face discussing the above recommendations as well as reviewing the patient's pathology results regarding recent bronchoscopy and diagnosis of cancer.   Current Outpatient Medications:  .  albuterol (PROVENTIL) (2.5 MG/3ML) 0.083% nebulizer solution, Take 3 mLs (2.5 mg total) by nebulization every 6 (six) hours as needed for wheezing or shortness of breath., Disp: 75 mL, Rfl: 5 .  amLODipine (NORVASC) 5 MG tablet, Take 1 tablet (5 mg total) by mouth daily., Disp: 90 tablet, Rfl: 1 .  aspirin EC 81 MG tablet, Take 81 mg by mouth daily., Disp: , Rfl:  .  atorvastatin (LIPITOR) 40 MG tablet, Take 1 tablet (40 mg total) by mouth daily at 6 PM., Disp: 90 tablet, Rfl: 1 .  Blood Glucose Monitoring Suppl (ONETOUCH VERIO)  w/Device KIT, 1 Device by Does not apply route once., Disp: 1 kit, Rfl: 0 .  clopidogrel (PLAVIX) 75 MG tablet, Take 1 tablet (75 mg total) by mouth daily., Disp: 90 tablet, Rfl: 3 .  diphenhydrAMINE (BENADRYL) 50 MG tablet, Take 0.5 tablets (25 mg total) by mouth once. 1 hr prior to CT scan, Disp: 30 tablet, Rfl: 0 .  fexofenadine (ALLEGRA ALLERGY) 180 MG tablet, Take 1 tablet (180 mg total) by mouth daily., Disp: 90 tablet, Rfl: 3 .  Fluticasone-Umeclidin-Vilant (TRELEGY ELLIPTA) 100-62.5-25 MCG/INH AEPB, Inhale 1 puff into the lungs daily. (Patient not taking: Reported on 02/16/2018), Disp: 28 each, Rfl: 5 .  Fluticasone-Umeclidin-Vilant (TRELEGY ELLIPTA) 100-62.5-25 MCG/INH AEPB, Inhale 1 puff into the lungs daily., Disp: 2 each, Rfl: 0 .  nitroGLYCERIN (NITROSTAT) 0.4 MG SL tablet, Place 1 tablet (0.4 mg total) under the tongue every 5 (five) minutes as needed for chest pain., Disp: 25 tablet, Rfl: 4 .  Omega-3 Fatty Acids (FISH OIL) 1000 MG CAPS, Take 1,000 mg by mouth daily. , Disp: , Rfl:  .  omeprazole (PRILOSEC) 40 MG capsule, Take 1 capsule (40 mg total) by mouth daily. PATIENT NEEDS OFFICE VISIT FOR FURTHER REFILLS (Patient not taking: Reported on 02/16/2018), Disp: 90 capsule, Rfl: 0 .  ONE TOUCH LANCETS MISC, 1 each by Does not apply route 3 (three) times daily., Disp: 200 each, Rfl: 5 .  ONETOUCH VERIO test strip, USE TO TEST 3 TIMES DAILY, Disp: 100 each, Rfl: 1 .  OVER THE COUNTER MEDICATION, Apply 1 application topically daily as needed (neuropathy pain). Hemp oil cream, Disp: , Rfl:  .  PROAIR HFA 108 (90 Base) MCG/ACT inhaler, USE 2 PUFFS EVERY 6 HOURS  AS NEEDED FOR WHEEZING OR  SHORTNESS OF BREATH (Patient taking differently: Inhale 2 puffs into the lungs every 6 (six) hours as needed for wheezing or shortness of breath. ), Disp: 34 g, Rfl: 3 .  traZODone (DESYREL) 100 MG tablet, TAKE 1 TABLET  BY MOUTH AT  BEDTIME, Disp: 90 tablet, Rfl: 1   Garner Nash, DO [facility] 03/02/2018 8:49 AM ]

## 2018-03-02 NOTE — Patient Instructions (Addendum)
Thank you for visiting Dr. Valeta Harms at [facility]. Today we recommend the following:  Orders Placed This Encounter  Procedures  . Ambulatory referral to Cardiothoracic Surgery   Return in about 8 weeks (around 04/27/2018), or if symptoms worsen or fail to improve.

## 2018-03-03 ENCOUNTER — Telehealth: Payer: Self-pay | Admitting: Internal Medicine

## 2018-03-03 DIAGNOSIS — K269 Duodenal ulcer, unspecified as acute or chronic, without hemorrhage or perforation: Secondary | ICD-10-CM

## 2018-03-03 DIAGNOSIS — K253 Acute gastric ulcer without hemorrhage or perforation: Secondary | ICD-10-CM

## 2018-03-03 MED ORDER — OMEPRAZOLE 40 MG PO CPDR: 40.0000 mg | DELAYED_RELEASE_CAPSULE | Freq: Every day | ORAL | 0 refills | Status: DC

## 2018-03-03 NOTE — Telephone Encounter (Signed)
Sent Omeprazole to [facility] - PATIENT NEEDS OFFICE VISIT FOR FURTHER REFILLS

## 2018-03-05 ENCOUNTER — Other Ambulatory Visit: Payer: Self-pay | Admitting: *Deleted

## 2018-03-05 NOTE — Progress Notes (Signed)
The proposed treatment discussed at cancer conference 03/05/18 is for discussion purpose only and is not a binding recommendation. The patient was not physically examined nor presented at time of discussion.  Therefore, the final treatment plan cannot be decided.

## 2018-03-12 ENCOUNTER — Telehealth: Payer: Self-pay | Admitting: Cardiovascular Disease

## 2018-03-12 ENCOUNTER — Institutional Professional Consult (permissible substitution) (INDEPENDENT_AMBULATORY_CARE_PROVIDER_SITE_OTHER): Payer: Medicare Other | Admitting: Cardiothoracic Surgery

## 2018-03-12 VITALS — BP 175/98 | HR 79 | Resp 20 | Ht 69.0 in | Wt 206.0 lb

## 2018-03-12 DIAGNOSIS — R911 Solitary pulmonary nodule: Secondary | ICD-10-CM | POA: Diagnosis not present

## 2018-03-12 NOTE — Progress Notes (Signed)
[address]       [place] [address]             [phone]                    Joshua [name] [place] Medical Record #[id number] Date of Birth: Jul 27, 1955  Referring: Joshua Nash, DO Primary Care: Joshua Fenton, NP Primary Cardiologist: Joshua [name]  Chief Complaint:    Chief Complaint  Patient presents with  . Lung Lesion    Surgical eval, PET Scan 02/09/18, PFT's 02/05/18,Chest CT SuperD 02/16/18 Bronch BX 02/25/18    History of Present Illness:    Joshua [name] 62 y.o. male is seen in the office  today for evaluation for possible right upper lobectomy.  The patient has a long history of smoking, recent exacerbations of COPD requiring courses of steroids and antibiotics x2.  Recent stenting of his right SFA artery in October, history of alcohol abuse, known coronary occlusive disease with multiple stents placed in 2017(occluded LCX, atherectomy/tandem DES mLAD 05/17/15)  ,  The patient had been followed with CT scans since 2014 with an enlarging right upper lobe cyst with groundglass opacities at the base of this. 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     Recent navigation bronchoscopy revealed adenocarcinoma.  PET scan shows no evidence of spread to lymph nodes or distant spread, lesion itself is not evident on PET scan.  PFTs have been done.    Patient notes that he is recently stopped his Plavix because of aching joints . Current Activity/ Functional Status:  Patient is independent with mobility/ambulation, transfers, ADL's, IADL's.   Zubrod Score: At the time of surgery this patient's most appropriate activity status/level should be described as: '[]'     0    Normal activity, no symptoms '[x]'     1    Restricted in physical strenuous activity but ambulatory, able to do out light work '[]'     2    Ambulatory and capable of self care, unable to do work activities, up and  about               >50 % of waking hours                              '[]'     3    Only limited self care, in bed greater than 50% of waking hours '[]'     4    Completely disabled, no self care, confined to bed or chair '[]'     5    Moribund   Past Medical History:  Diagnosis Date  . AC (acromioclavicular) joint bone spurs, unspecified laterality 11/25/2016  . Arthritis    "back" (05/16/2015)  . CAD in native artery, occluded Lcx and rotational atherectomy to LAD with DES 05/17/15 05/18/2015  . Cavitary lesion of lung 02/25/2018  . Chronic back pain    "all my back"  . Chronic bronchitis ([place])   . COPD (chronic obstructive pulmonary disease) ([place])   . Diabetes mellitus without complication ([place])   . GERD (gastroesophageal reflux disease)   . H/O blood clots    "had them in my back; put filter in before one of my neck ORs"  . History of gout   . Hyperlipidemia   . Hypertension   . Neuromuscular disorder (  [place])   . PAD (peripheral artery disease) ([place])   . Phlebitis   . Positive TB test   . Seasonal allergies   . Shortness of breath   . Spinal disease     Past Surgical History:  Procedure Laterality Date  . ABDOMINAL AORTOGRAM W/LOWER EXTREMITY  01/07/2018  . ABDOMINAL AORTOGRAM W/LOWER EXTREMITY N/A 01/07/2018   Procedure: ABDOMINAL AORTOGRAM W/LOWER EXTREMITY;  Surgeon: Joshua Hampshire, MD;  Location: [facility];  Service: Cardiovascular;  Laterality: N/A;  . ANTERIOR CERVICAL DECOMP/DISCECTOMY FUSION  ~ 2001-2009 X 3  . ANTERIOR CERVICAL DECOMP/DISCECTOMY FUSION N/A 10/06/2012   Procedure: ANTERIOR CERVICAL DECOMPRESSION/DISCECTOMY FUSION 1 LEVEL;  Surgeon: Joshua Ghee, MD;  Location: [facility];  Service: Neurosurgery;  Laterality: N/A;  C7T1 anterior cervical decompression with fusion plating and bonegraft   . BACK SURGERY    . CARDIAC CATHETERIZATION  05/16/2015  . CARDIAC CATHETERIZATION N/A 05/16/2015   Procedure: Left Heart Cath and Coronary Angiography;   Surgeon: Joshua Man, MD;  Location: [facility];  Service: Cardiovascular;  Laterality: N/A;  . CARDIAC CATHETERIZATION N/A 05/16/2015   Procedure: Coronary Balloon Angioplasty;  Surgeon: Joshua Man, MD;  Location: [facility];  Service: Cardiovascular;  Laterality: N/A;  . CARDIAC CATHETERIZATION N/A 05/17/2015   Procedure: Coronary Stent Intervention Rotoblater;  Surgeon: Joshua M Martinique, MD;  Location: [facility];  Service: Cardiovascular;  Laterality: N/A;  . ELBOW SURGERY Left 2016   "had bone pieces removed"  . ESOPHAGEAL MANOMETRY N/A 12/30/2016   Procedure: ESOPHAGEAL MANOMETRY (EM);  Surgeon: Joshua Shipper, MD;  Location: [facility];  Service: Endoscopy;  Laterality: N/A;  . EXCISIONAL HEMORRHOIDECTOMY    . FEMORAL BYPASS  ~ 2010  . IR RADIOLOGIST EVAL & MGMT  12/17/2016  . IVC FILTER PLACEMENT ([facility] HX)  2010   "had blood clots in my back"  . PERIPHERAL VASCULAR ATHERECTOMY  01/07/2018   Procedure: PERIPHERAL VASCULAR ATHERECTOMY;  Surgeon: Joshua Hampshire, MD;  Location: [facility];  Service: Cardiovascular;;  . PERIPHERAL VASCULAR BALLOON ANGIOPLASTY  01/07/2018   Procedure: PERIPHERAL VASCULAR BALLOON ANGIOPLASTY;  Surgeon: Joshua Hampshire, MD;  Location: [facility];  Service: Cardiovascular;;  . PERIPHERAL VASCULAR CATHETERIZATION N/A 02/14/2016   Procedure: Abdominal Aortogram w/Lower Extremity;  Surgeon: Joshua Hampshire, MD;  Location: [facility];  Service: Cardiovascular;  Laterality: N/A;  . POSTERIOR LAMINECTOMY / DECOMPRESSION LUMBAR SPINE  2006   "bone spurs"  . VIDEO BRONCHOSCOPY WITH ENDOBRONCHIAL NAVIGATION Right 02/25/2018   Procedure: VIDEO BRONCHOSCOPY WITH ENDOBRONCHIAL NAVIGATION;  Surgeon: Joshua Nash, DO;  Location: [facility];  Service: Thoracic;  Laterality: Right;  [name] VIDEO BRONCHOSCOPY WITH ENDOBRONCHIAL ULTRASOUND Right 02/25/2018   Procedure: VIDEO BRONCHOSCOPY WITH ENDOBRONCHIAL ULTRASOUND;   Surgeon: Joshua Nash, DO;  Location: [facility];  Service: Thoracic;  Laterality: Right;    Family History  Problem Relation Age of Onset  . Stomach cancer Mother        Deceased, 54s  . Hypertension Mother   . Hypertension Father   . Stroke Father   . Heart attack Father        Deceased, 24  . Aneurysm Brother   . Healthy Daughter   . Healthy Maternal Grandmother   . Leukemia Grandchild   . Diabetes Neg Hx   . Early death Neg Hx   . Colon cancer Neg Hx   . Pancreatic cancer Neg  Hx      Social History   Tobacco Use  Smoking Status Current Every Day Smoker  . Packs/day: 0.75  . Years: 49.00  . Pack years: 36.75  . Types: Cigarettes  Smokeless Tobacco Never Used  Tobacco Comment   half a pack a day 12/25/2017    Social History   Substance and Sexual Activity  Alcohol Use Yes  . Alcohol/week: 0.0 standard drinks   Comment: 2019 NO ALCOHOL IN 2 MONTHS     Allergies  Allergen Reactions  . Lisinopril Hives and Itching  . Amitriptyline Other (See Comments)    Makes pt feel "weird" and nausea.  . Contrast Media [Iodinated Diagnostic Agents] Hives and Itching    Pt can use if taking benadryl   . Ibuprofen Other (See Comments)    Internal bleeding    Current Outpatient Medications  Medication Sig Dispense Refill  . albuterol (PROVENTIL) (2.5 MG/3ML) 0.083% nebulizer solution Take 3 mLs (2.5 mg total) by nebulization every 6 (six) hours as needed for wheezing or shortness of breath. 75 mL 5  . aspirin EC 81 MG tablet Take 81 mg by mouth daily.    [name] atorvastatin (LIPITOR) 40 MG tablet Take 1 tablet (40 mg total) by mouth daily at 6 PM. 90 tablet 1  . Blood Glucose Monitoring Suppl (ONETOUCH VERIO) w/Device KIT 1 Device by Does not apply route once. 1 kit 0  . diphenhydrAMINE (BENADRYL) 50 MG tablet Take 0.5 tablets (25 mg total) by mouth once. 1 hr prior to CT scan 30 tablet 0  . fexofenadine (ALLEGRA ALLERGY) 180 MG tablet Take 1 tablet (180 mg total) by mouth  daily. 90 tablet 3  . Fluticasone-Umeclidin-Vilant (TRELEGY ELLIPTA) 100-62.5-25 MCG/INH AEPB Inhale 1 puff into the lungs daily. 28 each 5  . Fluticasone-Umeclidin-Vilant (TRELEGY ELLIPTA) 100-62.5-25 MCG/INH AEPB Inhale 1 puff into the lungs daily. 2 each 0  . nitroGLYCERIN (NITROSTAT) 0.4 MG SL tablet Place 1 tablet (0.4 mg total) under the tongue every 5 (five) minutes as needed for chest pain. 25 tablet 4  . Omega-3 Fatty Acids (FISH OIL) 1000 MG CAPS Take 1,000 mg by mouth daily.     [name] omeprazole (PRILOSEC) 40 MG capsule Take 1 capsule (40 mg total) by mouth daily. PATIENT NEEDS OFFICE VISIT FOR FURTHER REFILLS 90 capsule 0  . ONE TOUCH LANCETS MISC 1 each by Does not apply route 3 (three) times daily. 200 each 5  . ONETOUCH VERIO test strip USE TO TEST 3 TIMES DAILY 100 each 1  . OVER THE COUNTER MEDICATION Apply 1 application topically daily as needed (neuropathy pain). Hemp oil cream    . PROAIR HFA 108 (90 Base) MCG/ACT inhaler USE 2 PUFFS EVERY 6 HOURS  AS NEEDED FOR WHEEZING OR  SHORTNESS OF BREATH (Patient taking differently: Inhale 2 puffs into the lungs every 6 (six) hours as needed for wheezing or shortness of breath. ) 34 g 3  . traZODone (DESYREL) 100 MG tablet TAKE 1 TABLET BY MOUTH AT  BEDTIME 90 tablet 1  . amLODipine (NORVASC) 5 MG tablet Take 1 tablet (5 mg total) by mouth daily. 90 tablet 1  . clopidogrel (PLAVIX) 75 MG tablet Take 1 tablet (75 mg total) by mouth daily. (Patient not taking: Reported on 03/12/2018) 90 tablet 3   No current facility-administered medications for this visit.     Pertinent items are noted in HPI.   Review of Systems:     Cardiac Review of Systems: [Y] =  yes  or   [ N ] = no   Chest Pain Florencio.Farrier   ]  Resting SOB [ n  ] Exertional SOB  Blue.Reese ]  Orthopnea [ n ]   Pedal Edema [ n  ]    Palpitations [n  ] Syncope  [n  ]   Presyncope [n   ]   General Review of Systems: [Y] = yes [  ]=no Constitional: recent weight change [  ];  Wt loss over the last 3  months [   ] anorexia [  ]; fatigue [  ]; nausea [  ]; night sweats [  ]; fever [  ]; or chills [  ];           Eye : blurred vision [  ]; diplopia [   ]; vision changes [  ];  Amaurosis fugax[  ]; Resp: cough [ y ];  wheezing[ y ];  hemoptysis[  ]; shortness of breath[y  ]; paroxysmal nocturnal dyspnea[  ]; dyspnea on exertion[ y ]; or orthopnea[  ];  GI:  gallstones[  ], vomiting[  ];  dysphagia[  ]; melena[  ];  hematochezia [  ]; heartburn[  ];   Hx of  Colonoscopy[  ]; GU: kidney stones [  ]; hematuria[  ];   dysuria [  ];  nocturia[  ];  history of     obstruction [  ]; urinary frequency [  ]             Skin: rash, swelling[  ];, hair loss[  ];  peripheral edema[  ];  or itching[  ]; Musculosketetal: myalgias[  ];  joint swelling[  ];  joint erythema[  ];  joint pain[  ];  back pain[  ];  Heme/Lymph: bruising[  ];  bleeding[  ];  anemia[  ];  Neuro: TIA[  ];  headaches[  ];  stroke[  ];  vertigo[  ];  seizures[  ];   paresthesias[  ];  difficulty walking[  ];  Psych:depression[  ]; anxiety[  ];  Endocrine: diabetes[y on no meds   ];  thyroid dysfunction[  ];  Immunizations: Flu up to date [7]; Pneumococcal up to date [  n];  Other:     PHYSICAL EXAMINATION: BP (!) 175/98 (BP Location: Left Arm, Cuff Size: Normal)   Pulse 79   Resp 20   Ht '5\' 9"'  (1.753 m)   Wt 206 lb (93.4 kg)   SpO2 96% Comment: RA  BMI 30.42 kg/m  General appearance: alert, cooperative, appears older than stated age and no distress Head: Normocephalic, without obvious abnormality, atraumatic Neck: no adenopathy, no carotid bruit, no JVD, supple, symmetrical, trachea midline and thyroid not enlarged, symmetric, no tenderness/mass/nodules Lymph nodes: Cervical, supraclavicular, and axillary nodes normal. Resp: wheezes bilaterally Back: symmetric, no curvature. ROM normal. No CVA tenderness. Cardio: regular rate and rhythm, S1, S2 normal, no murmur, click, rub or gallop GI: soft, non-tender; bowel sounds normal;  no masses,  no organomegaly Extremities: Patient has palpable DP and PT pulses bilaterally Neurologic: Grossly normal   Diagnostic Studies & Laboratory data:     Recent Radiology Findings:   Dg Chest Port 1 View  Result Date: 02/25/2018 CLINICAL DATA:  Right upper lobe bronchoscopy and biopsy. EXAM: PORTABLE CHEST 1 VIEW COMPARISON:  CT chest dated February 16, 2018. FINDINGS: The heart size and mediastinal contours are within normal limits. Normal pulmonary vascularity. Increased density in the anterior right upper lobe.  No pleural effusion or pneumothorax. No acute osseous abnormality. IMPRESSION: 1. Post biopsy hemorrhage in the anterior right upper lobe. No pneumothorax. Electronically Signed   By: Titus Dubin M.D.   On: 02/25/2018 11:05   Ct Super D Chest Wo Contrast  Result Date: 02/16/2018 CLINICAL DATA:  Cavitary RIGHT upper lobe lesion. EXAM: CT CHEST WITHOUT CONTRAST TECHNIQUE: Multidetector CT imaging of the chest was performed using thin slice collimation for electromagnetic bronchoscopy planning purposes, without intravenous contrast. COMPARISON:  PET-CT 02/09/2018, CT 01/12/2018 FINDINGS: Cardiovascular: Coronary artery calcification and aortic atherosclerotic calcification. Mediastinum/Nodes: No axillary supraclavicular adenopathy. No mediastinal hilar adenopathy. No pericardial effusion. Esophagus. Lungs/Pleura: Thin wall cavitary lesion RIGHT upper lobe measures 3.0 x 2.5 cm compared to 3.3 x 2.7 cm for no significant change. No new lesions are present. Mild paraseptal emphysema in the more superior upper lobes. Upper Abdomen: Limited view of the liver, kidneys, pancreas are unremarkable. Normal adrenal glands. Musculoskeletal: No aggressive osseous lesion. IMPRESSION: 1. Thin wall cavitary RIGHT upper lobe lesion. 2. Coronary artery calcification and Aortic Atherosclerosis (ICD10-I70.0). Electronically Signed   By: Suzy Bouchard M.D.   On: 02/16/2018 14:34   Dg C-arm  Bronchoscopy  Result Date: 02/25/2018 C-ARM BRONCHOSCOPY: Fluoroscopy was utilized by the requesting physician.  No radiographic interpretation.     I have independently reviewed the above radiology studies  and reviewed the findings with the patient.   Recent Lab Findings: Lab Results  Component Value Date   WBC 11.0 (H) 02/18/2018   HGB 17.2 (H) 02/18/2018   HCT 52.0 02/18/2018   PLT 226 02/18/2018   GLUCOSE 103 (H) 02/18/2018   CHOL 215 (H) 11/21/2017   TRIG 94 11/21/2017   HDL 126 11/21/2017   LDLDIRECT 107.0 04/21/2015   LDLCALC 70 11/21/2017   ALT 46 (H) 02/18/2018   AST 46 (H) 02/18/2018   NA 139 02/18/2018   K 4.4 02/18/2018   CL 104 02/18/2018   CREATININE 0.91 02/18/2018   BUN 6 (L) 02/18/2018   CO2 21 (L) 02/18/2018   INR 0.92 02/18/2018   HGBA1C 5.1 02/18/2018   PFT's FEV! 1.75  50 DLCO 23.38 75% INTERPRETATION: Technique: Good technique. SPIROMETRY: Moderate Airflow Limitation, moderate obstructive defect Bronchodilator response: Significant improvement in airflows after bronchodilator. DLCO: Decreased diffusion capacity that corrects to the normal range when adjusted for the inhaled alveolar volume. LUNG VOLUMES: Lung volumes demonstrate hyperinflation  PATH: Diagnosis 1. Lung, biopsy, Right Upper Lobe - NON-SMALL CELL CARCINOMA, SEE COMMENT. 2. Lung, biopsy, Right upper lobe - BRONCHIAL MUCOSA WITH NO SIGNIFICANT PATHOLOGIC FINDINGS. Microscopic Comment 1. Immunohistochemistry for TTF-1 and Napsin-A is positive and PAX 8 is negative, consistent with pulmonary adenocarcinoma. Results reported to Dr. June Leap on 02/27/2018. Intradepartmental consultation was obtained (Dr. Tresa Moore - 1). Gillie Manners MD Pathologist, Electronic Signature  Cardiac cath 05/16/15:  Prox LAD to Mid LAD lesion, 90% stenosed. Post intervention, there remains 80% residual calcific stenosis.  Mid LAD lesion, 80% stenosed. Dist LAD lesion, 55% stenosed.  Prox  Cx lesion, 100% stenosed. Prox Cx to Mid Cx lesion, 99% stenosed. Ost 2nd Mrg to 2nd Mrg lesion, 99% stenosed. -- Seen via retrograde filling from LAD/diagonal-OM collaterals  There is hyperdynamic left ventricular systolic function.  Elevated LVEDP Severe 2 site lesion in the LAD. Very rigid lesions that would not yield to Cutting Balloon or noncompliant balloon angioplasty. These lesions are likely best treated with rotational atherectomy after review with Dr. Peter [name]. Plan: Monitor overnight following PTCA. We'll plan for  return to the Cath Lab tomorrow for rotational atherectomy of the LAD with stenting of the 2 lesions.  PCI 05/17/15:  Mid LAD lesion, 80% stenosed. Post intervention, there is a 0% residual stenosis.  Prox LAD to Mid LAD lesion, 80% stenosed. Post intervention, there is a 0% residual stenosis. The lesion was previously treated with angioplasty. Successful rotational atherectomy and stenting of tandem lesions in the mid LAD with DES. Plan: DAPT for one year. Anticipate DC in am.  Recent aortagram 01/07/2018 Aortogram with BLE runoff 01/07/18: 1. Widely patent aortobifemoral bypass with no obstructive disease. 2. Right lower extremity: Severe calcified stenosis affecting the ostial SFA with moderate calcified disease in the mid and distal segment, three-vessel runoff below the knee. 3. Left lower extremity: 60% ostial stenosis of the SFA with no other obstructive disease. 4. Successful orbital atherectomy and drug-coated balloon angioplasty to the ostial and proximal right SFA. Recommendations: The patient is already on dual antiplatelet therapy. Continue aggressive treatment of risk factors.  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  We discussed the treatment options for stage I lung cancer including resection, which in this case would require lobectomy versus stereotactic radiotherapy.  The patient has significant underlying medical conditions that will need to be optimized prior to consideration of surgical resection, particularly his active wheezing in the office today.  In addition he will need to see cardiology for clearance, with coronary stents in 2017 and recent right leg stent October  2019.  Referred the patient for cardiology clearance Joshua [name] Will have pulmonary follow-up with the patient and further work on his underlying pulmonary treatment including bronchodilators Refer to pulmonary rehab  We will plan to see back in 2 weeks and evaluate his progression, and make final decision concerning his suitability for surgical resection versus stereotactic radiotherapy.  I  spent 60 minutes with  the patient face to face and greater then 50% of the time was spent in counseling and coordination of care.    Grace Isaac MD      [address] [place] [address] Office [phone]   Beeper [phone]  03/12/2018 12:05 PM

## 2018-03-12 NOTE — Telephone Encounter (Signed)
° °  [facility] Pre-operative Risk Assessment    Request for surgical clearance:  1. What type of surgery is being performed? Possible Lung Lobectomy   2. When is this surgery scheduled? Pending   3. What type of clearance is required (medical clearance vs. Pharmacy clearance to hold med vs. Both)? Cardiaccal Clearance  4. Are there any medications that need to be held prior to surgery and how long?no  5. Practice name and name of physician performing surgery?  Dr Lanelle Bal   6. What is your office phone number [phone]    7.   What is your office fax number [phone]  8.   Anesthesia type (None, local, MAC, general) ? She did not know   Glyn Ade 03/12/2018, 12:18 PM  _________________________________________________________________   (provider comments below)

## 2018-03-12 NOTE — Patient Instructions (Addendum)
Lung Cancer Lung cancer is an abnormal growth of cancerous cells that forms a mass (malignant tumor) in a lung. There are several types of lung cancer. The types are based on the appearance of the tumor cells. The two most common types are:  Non-small cell lung cancer. This type of lung cancer is the most common type. Non-small cell lung cancers include squamous cell carcinoma, adenocarcinoma, and large cell carcinoma.  Small cell lung cancer. In this type of lung cancer, abnormal cells are smaller than those of non-small cell lung cancer. Small cell lung cancer gets worse (progresses) faster than non-small cell lung cancer. What are the causes? The most common cause of lung cancer is smoking tobacco. The second most common cause is exposure to a chemical called radon. What increases the risk? You are more likely to develop this condition if:  You smoke tobacco.  You have been exposed to: ? Secondhand tobacco smoke. ? Radon gas. ? Uranium. ? Asbestos. ? Arsenic in drinking water. ? Air pollution.  You have a family or personal history of lung cancer.  You have had lung radiation therapy in the past.  You are older than age 59. What are the signs or symptoms? In the early stages, you may not have any symptoms. As the cancer progresses, symptoms may include:  A lasting cough, possibly with blood.  Fatigue.  Unexplained weight loss.  Shortness of breath.  Loud breathing (wheezing).  Chest pain.  Loss of appetite. Symptoms of advanced lung cancer include:  Hoarseness.  Bone or joint pain.  Weakness.  Change in the structure of the fingernails (clubbing), so that the nail looks like an upside-down spoon.  Swelling of the face or arms.  Inability to move the face (paralysis).  Drooping eyelids. How is this diagnosed? This condition may be diagnosed based on:  Your symptoms and medical history.  A physical exam.  A chest X-ray.  A CT scan.  Blood  tests.  Sputum tests.  Removal of a sample of lung tissue (lung biopsy) for testing. Your cancer will be assessed (staged) to determine how severe it is and how much it has spread (metastasized). How is this treated? Treatment depends on the type and stage of your cancer. Treatment may include one or more of the following:  Surgery to remove as much of the cancer as possible. Lymph nodes in the area may be removed and tested for cancer as well.  Medicines that kill cancer cells (chemotherapy).  High-energy rays that kill cancer cells (radiation therapy).  Chemotherapy. This treatment uses medicines to destroy cancer cells.  Targeted therapy. This targets specific parts of cancer cells and the area around them to block the growth and spread of the cancer. Targeted therapy can help limit the damage to healthy cells. Follow these instructions at home: Eating and drinking  Some of your treatments might affect your appetite. If you are having problems eating, or if you do not have an appetite, meet with a dietitian.  If you have side effects that affect your appetite, it may help to: ? Eat smaller meals and snacks often. ? Drink high-nutrition and high-calorie shakes or supplements. ? Eat bland and soft foods that are easy to eat. ? Avoid eating foods that are hot, spicy, or hard to swallow. General instructions   Do not use any products that contain nicotine or tobacco, such as cigarettes and e-cigarettes. If you need help quitting, ask your health care provider.  Do  not drink alcohol.  If you are admitted to the hospital, make sure your cancer specialist (oncologist) is aware. Your cancer may affect your treatment for other conditions.  Take over-the-counter and prescription medicines only as told by your health care provider.  Consider joining a support group for people who have been diagnosed with lung cancer.  Work with your health care provider to manage any side effects of  treatment.  Keep all follow-up visits as told by your health care provider. This is important. Where to find more information  American Cancer Society: https://www.cancer.[place] https://www.cancer.gov Contact a health care provider if you:  Lose weight without trying.  Have a persistent cough and wheezing.  Feel short of breath.  Get tired easily.  Have bone or joint pain.  Have difficulty swallowing.  Notice that your voice is changing or getting hoarse.  Have pain that does not get better with medicine. Get help right away if you:  Cough up blood.  Have new breathing problems.  Have chest pain.  Have a fever.  Have swelling in an ankle, leg, or arm, or the face or neck.  Have paralysis in your face.  Are very confused.  Have a drooping eyelid. Summary  Lung cancer is an abnormal growth of cancerous cells that forms a mass (malignant tumor) in a lung.  There are several types of lung cancer. The types are based on the appearance of the tumor cells. The two most common types are non-small cell and small cell.  The most common cause of lung cancer is smoking tobacco.  Early symptoms include a lasting cough, possibly with blood, and fatigue, unexplained weight loss, and shortness of breath.  After diagnosis, treatment depends on the type and stage of your cancer. This information is not intended to replace advice given to you by your health care provider. Make sure you discuss any questions you have with your health care provider. Document Released: 06/10/2000 Document Revised: 01/09/2017 Document Reviewed: 01/09/2017 Elsevier Interactive Patient Education  2019 [employer]   Lung Resection  A lung resection is a procedure to remove part or all of a lung. An entire lung may be removed (pneumonectomy), or only part of it may be removed (lobectomy). A lung resection may be done as an open surgery or as a minimally invasive  surgery. A lung resection is most often done to remove a tumor or cancer, but it may be done to treat other conditions. The procedure can relieve symptoms and keep the problem from getting worse. Tell a health care provider about:  Any allergies you have.  All medicines you are taking, including vitamins, herbs, eye drops, creams, and over-the-counter medicines.  Any problems you or family members have had with anesthetic medicines.  Any blood disorders you have.  Any surgeries you have had.  Any medical conditions you have.  Whether you are pregnant or may be pregnant. What are the risks? Generally, this is a safe procedure. However, problems may occur, including:  Excessive bleeding.  Infection.  Reaction to anesthesia.  Allergic reaction to medicines.  Blood clots.  Injury to a nerve or blood vessel.  Problems breathing.  Heart problems.  Stroke. What happens before the procedure? Staying hydrated Follow instructions from your health care provider about hydration, which may include:  Up to 2 hours before the procedure - you may continue to drink clear liquids, such as water, clear fruit juice, black coffee, and plain tea. Eating and drinking  restrictions Follow instructions from your health care provider about eating and drinking, which may include:  8 hours before the procedure - stop eating heavy meals or foods such as meat, fried foods, or fatty foods.  6 hours before the procedure - stop eating light meals or foods, such as toast or cereal.  6 hours before the procedure - stop drinking milk or drinks that contain milk.  2 hours before the procedure - stop drinking clear liquids. Medicines Ask your health care provider about:  Changing or stopping your regular medicines. This is especially important if you are taking diabetes medicines or blood thinners.  Taking medicines such as aspirin and ibuprofen. These medicines can thin your blood. Do not take  these medicines unless your health care provider tells you to take them.  Taking over-the-counter medicines, vitamins, herbs, and supplements. Tests You may have tests done before the procedure, including:  Blood and urine tests.  Imaging tests, such as X-rays, CT, MRI, or PET scans.  Bronchoscopy. In this procedure, a health care provider uses a flexible tube (bronchoscope) to look at the inside of your airways.  Pulmonary function tests. These are done to check how well your lungs work.  Heart testing. This is done to check heart function before the procedure.  Lymph node sampling. This may be done to see if you have a tumor that has spread. General instructions  Plan to have someone take you home from the hospital or clinic.  Plan to have a responsible adult care for you for at least 24 hours after you leave the hospital or clinic. This is important.  Do not use any products that contain nicotine or tobacco for as long as possible before your procedure. These include cigarettes and e-cigarettes. If you need help quitting, ask your health care provider.  Ask your health care provider what steps will be taken to help prevent infection. These may include: ? Removing hair at the surgery site. ? Washing skin with a germ-killing soap. ? Taking antibiotic medicine. What happens during the procedure?  An IV will be inserted into one of your veins. You will be given one or both of the following: ? A medicine to help you relax (sedative). ? A medicine to make you fall asleep (general anesthetic).  A breathing tube will be placed in your throat.  A thin tube (catheter) may be inserted into the part of your body that drains urine from the bladder (urethra). The catheter will drain your urine.  Your health care provider will make a large incision on your side (open lung surgery) or several small incisions over your chest area (minimally invasive surgery).  Your health care provider  will carefully cut any tissues leading to the area of the lung being treated.  The lung or part of the lung will then be removed. Lymph nodes near the lung may also be removed for testing.  Your health care provider will check inside your chest to make sure there is no bleeding in or around the lungs.  Your health care provider may put tubes into your chest to drain extra fluid and air after surgery.  Your incisions will be closed. This may be done using stitches (sutures), staples, skin glue, or skin tape (adhesive) strips.  A bandage (dressing) may be placed over your incisions. The procedure may vary among health care providers and hospitals. What happens after the procedure?  Your blood pressure, heart rate, breathing rate, and blood oxygen level will be  monitored until you leave the hospital or clinic.  Right after surgery, you may: ? Be moved to the intensive care unit (ICU). ? Continue to have a tube to help you breathe or have a urinary catheter. ? Have an IV for fluids and medicines. ? Remain on a respirator, if assistance is needed to help you breathe. ? Start respiratory therapy in the ICU. This will help your other lung to get strong and stay healthy. ? Be given medicine to help with pain and nausea.  You may have to wear compression stockings. These stockings help to prevent blood clots and reduce swelling in your legs.  As you continue to recover: ? You will be moved to a regular hospital room. Therapy will continue. ? You may be released to go home or to an extended care facility. Summary  A lung resection is a procedure to remove part or all of a lung. It can be done as an open surgery or a minimally invasive surgery.  A lung resection is most often done to remove a tumor or cancer, but it may be done to treat other conditions.  After surgery, respiratory therapy will be prescribed to help your lung recover and become stronger. This information is not intended to  replace advice given to you by your health care provider. Make sure you discuss any questions you have with your health care provider. Document Released: 05/25/2002 Document Revised: 03/17/2017 Document Reviewed: 03/17/2017 Elsevier Interactive Patient Education  2019 [place].  Lung Resection, Care After This sheet gives you information about how to care for yourself after your procedure. Your health care provider may also give you more specific instructions. If you have problems or questions, contact your health care provider. What can I expect after the procedure? After the procedure, it is common to have:  Pain in your throat and near your incisions.  Pain when taking deep breaths.  Nausea.  Tiredness (fatigue). Follow these instructions at home:  Medicines  Take over-the-counter and prescription medicines only as told by your health care provider.  If you were prescribed an antibiotic medicine, take it as told by your health care provider. Do not stop taking the antibiotic even if you start to feel better.  If you are taking prescription pain medicine, take actions to prevent or treat constipation. Your health care provider may recommend that you: ? Drink enough fluid to keep your urine pale yellow. ? Eat foods that are high in fiber, such as fresh fruits and vegetables, whole grains, and beans. ? Limit foods that are high in fat and processed sugars, such as fried or sweet foods. ? Take an over-the-counter or prescription medicine for constipation. Incision care  Follow instructions from your health care provider about how to take care of your incisions. Make sure you: ? Wash your hands with soap and water before you change your bandage (dressing). If soap and water are not available, use hand sanitizer. ? Change your dressing as told by your health care provider. ? Leave stitches (sutures), skin glue, or adhesive strips in place. These skin closures may need to stay in  place for 2 weeks or longer. If adhesive strip edges start to loosen and curl up, you may trim the loose edges. Do not remove adhesive strips completely unless your health care provider tells you to do that.  Check your incision area every day for signs of infection. Check for: ? Redness, swelling, or pain. ? Fluid or blood. ? Pus or  a bad smell. ? Warmth.  Do not take baths, swim, or use a hot tub until your health care provider approves. Ask your health care provider if you may take showers. Preventing pneumonia   Do breathing exercises as instructed by your health care provider. Doing this helps prevent lung infection (pneumonia).  Try to breathe deeply and cough as told by your health care provider. Holding a pillow firmly over your ribs may help with discomfort.  If you were given an incentive spirometer in the hospital, continue to use it as directed by your health care provider.  Participate in pulmonary rehabilitation as directed by your health care provider. This is a program that combines education, exercise, and support from a team of specialists. The goal is to help you heal and get back to your normal activities as soon as possible. Activity  Rest as told by your health care provider.  Avoid sitting for a long time without moving. Get up to take short walks every 1-2 hours. This is important to improve blood flow and breathing. Ask for help if you feel weak or unsteady.  Ask your health care provider what activities are safe for you.  Do not lift anything that is heavier than 10 lb (4.5 kg), or the limit that you are told, until your health care provider says that it is safe.  Return to a normal diet and activities as told by your health care provider. General instructions  Wear compression stockings as told by your health care provider. These stockings help to prevent blood clots and reduce swelling in your legs.  If you have a chest tube, care for it as instructed by  your health care provider. Do not travel by airplane during the 2 weeks after your chest tube is removed, or until your health care provider says that this is safe.  Do not use any products that contain nicotine or tobacco, such as cigarettes and e-cigarettes. These can delay healing after surgery. If you need help quitting, ask your health care provider.  Do not drive until your health care provider approves.  Do not drive or use heavy machinery while taking prescription pain medicine.  Keep all follow-up visits as told by your health care provider. This is important. Contact a health care provider if you:  Have redness, swelling, or pain around your incision.  Have fluid or blood coming from your incision.  Have pus or a bad smell coming from your incision or bandage.  Have an incision that feels warm to the touch.  Have a fever or chills.  Notice that your incision is breaking open.  Cough up blood or pus, or you develop a cough that produces bad-smelling sputum.  Have pain or swelling in your legs.  Have increasing pain that is not controlled with medicine.  Have trouble managing any of the tubes that have been left in place after surgery. Get help right away if you:  Have chest pain or an irregular or rapid heartbeat.  Feel weak, light-headed, or dizzy.  Have shortness of breath or difficulty breathing.  Have persistent nausea or vomiting.  Have a rash. These symptoms may represent a serious problem that is an emergency. Do not wait to see if the symptoms will go away. Get medical help right away. Call your local emergency services (911 in the [place] Do not drive yourself to the hospital. Summary  After lung resection surgery, it is common to have pain around your incisions, pain when taking  deep breaths, nausea, and fatigue.  Follow instructions from your health care provider about how to take care of your incisions.  Be sure to contact your health care provider  if you have any redness or swelling around your incision area or if blood, pus, or other fluid drains from your incision. This information is not intended to replace advice given to you by your health care provider. Make sure you discuss any questions you have with your health care provider. Document Released: 09/21/2004 Document Revised: 03/17/2017 Document Reviewed: 03/17/2017 Elsevier Interactive Patient Education  2019 [employer].

## 2018-03-13 ENCOUNTER — Telehealth: Payer: Self-pay

## 2018-03-13 NOTE — Telephone Encounter (Signed)
Patient returned call, CB is [phone].

## 2018-03-13 NOTE — Telephone Encounter (Signed)
Called and spoke with pt. Stated to pt that per Dr. Valeta Harms and Dr. Servando Snare, we needed to try to get him in for an appt to further address his symptoms if he was fine with scheduling one. Pt stated that would be fine as when he went to see Dr. Servando Snare, pt was wheezing a lot and pt stated due to this, his surgery has been pushed back.  Pt has been scheduled an appt with Lazaro Arms Monday, 03/16/18 at 9am. Nothing further needed.

## 2018-03-13 NOTE — Telephone Encounter (Signed)
Attempted to call pt but unable to reach him. Left message for pt to return call. 

## 2018-03-13 NOTE — Telephone Encounter (Signed)
Patient returned call, CB is [phone].

## 2018-03-13 NOTE — Telephone Encounter (Signed)
   Primary Cardiologist: Candee Furbish, MD  Chart reviewed as part of pre-operative protocol coverage. Patient was contacted 03/13/2018 in reference to pre-operative risk assessment for pending surgery as outlined below.  LUKASZ ROGUS was last seen on 01/27/2018 by Dr. Fletcher [name].  Since that day, AYO SMOAK has done well. He has had no chest pressure or discomfort. No new cardiac complaints. He has quit smoking. He is having some wheezing and has appt with pulmonology prior to surgery to get tuned up.   According to the [facility], Mr. Holte is a class III risk with 6.6% risk of major cardiac event perioperatively related to the area of surgery, intrathoracic, and his hx of CAD.   Therefore, based on ACC/AHA guidelines, the patient would be at acceptable risk for the planned procedure without further cardiovascular testing.   His Plavix can be held for surgery and resumed as soon as safe.   I will route this recommendation to the requesting party via Epic fax function and remove from pre-op pool.  Please call with questions.  Daune Perch, NP 03/13/2018, 2:01 PM

## 2018-03-13 NOTE — Telephone Encounter (Signed)
LMTCB.   Per Dr. Valeta Harms:  Please see office note from Dr. Lianne Bushy. Get [name] to see one of our Nps next week. He will likely need help obtaining or paying for his inhalers. He may need scheduled nebs at home too. Also, make sure he has stopped smoking. Make sure he is enrolled and started pulmonary rehab.  Thanks,  Leory Plowman

## 2018-03-16 ENCOUNTER — Encounter: Payer: Self-pay | Admitting: Nurse Practitioner

## 2018-03-16 ENCOUNTER — Other Ambulatory Visit: Payer: Self-pay

## 2018-03-16 ENCOUNTER — Ambulatory Visit: Payer: Medicare Other | Admitting: Nurse Practitioner

## 2018-03-16 VITALS — BP 150/84 | HR 78 | Ht 69.0 in | Wt 214.4 lb

## 2018-03-16 DIAGNOSIS — J449 Chronic obstructive pulmonary disease, unspecified: Secondary | ICD-10-CM

## 2018-03-16 DIAGNOSIS — C3411 Malignant neoplasm of upper lobe, right bronchus or lung: Secondary | ICD-10-CM

## 2018-03-16 DIAGNOSIS — Z87891 Personal history of nicotine dependence: Secondary | ICD-10-CM

## 2018-03-16 MED ORDER — PREDNISONE 10 MG PO TABS: ORAL_TABLET | ORAL | 0 refills | Status: DC

## 2018-03-16 NOTE — Patient Instructions (Addendum)
Congratulations on quitting smoking!!! Please continue trelegy Continue albuterol nebs and Proventil inhaler as needed Patient has not started pulmonary rehab yet that was ordered by Dr. Servando Snare  Please work on [employer] Information given in office today about free nicotine replacement therapy Will order short prednisone taper and give samples of mucinex today Please keep upcoming follow up with Dr. Valeta Harms

## 2018-03-16 NOTE — Progress Notes (Signed)
PCCM: Agree. Thanks for seeing. We will need to ensure follow up with Dr. Servando Snare.  Garner Nash, DO [facility] 03/16/2018 3:07 PM

## 2018-03-16 NOTE — Assessment & Plan Note (Signed)
Please keep upcoming appointments with cardiothoracic surgery

## 2018-03-16 NOTE — Assessment & Plan Note (Addendum)
Dr. Pia Mau was concerned about patient at his last visit due to wheezing.  He seems to be doing well today.  He is not wheezing.  I will give a short prednisone taper.  Advised him to continue neb treatments and Trelegy as ordered.  We will also give samples of Mucinex today in office.   Patient Instructions  Congratulations on quitting smoking!!! Please continue trelegy Continue albuterol nebs and Proventil inhaler as needed Patient has not started pulmonary rehab yet that was ordered by Dr. Servando Snare  Please work on [employer] Information given in office today about free nicotine replacement therapy Will order short prednisone taper and give samples of mucinex today Please keep upcoming follow up with Dr. Valeta Harms

## 2018-03-16 NOTE — Assessment & Plan Note (Signed)
Congratulations on quitting smoking! Card given in office today for free nicotine replacement therapy as needed

## 2018-03-16 NOTE — Progress Notes (Signed)
'@Patient'  ID: Joshua [name], male    DOB: [birth year], 62 y.o.   MRN: [id number]  Chief Complaint  Patient presents with  . Wheezing    Referring provider: Jearld Fenton, NP  HPI 62 year old male former smoker (quit 5 days ago) with COPD and malignant neoplasm of upper lobe of right lung.  Followed by Dr. Valeta Harms.  He has been referred to cardiothoracic surgery for evaluation for right upper lobectomy.  Tests:  Imaging: 2018 CT chest: Right lower lobe 6 mm pulmonary nodule stable from 2014 2018 on prior imaging.  02/16/2018 super D CT chest: Right upper lobe cystic lesion.  Pathology:   Bronchoscopy: 02/25/2018 ENB/Radial EBUS  + RUL NSCLC, Adenocarcinoma   Heart Catheterization:   Prox LAD to Mid LAD lesion, 90% stenosed. Post intervention, there remains 80% residual calcific stenosis.  Mid LAD lesion, 80% stenosed. Dist LAD lesion, 55% stenosed.  Prox Cx lesion, 100% stenosed. Prox Cx to Mid Cx lesion, 99% stenosed. Ost 2nd Mrg to 2nd Mrg lesion, 99% stenosed. -- Seen via retrograde filling from LAD/diagonal-OM collaterals  There is hyperdynamic left ventricular systolic function.  Elevated LVEDP  PFT:  PFT Results Latest Ref Rng & Units 02/05/2018  FVC-Pre L 3.05  FVC-Predicted Pre % 66  FVC-Post L 3.30  FVC-Predicted Post % 72  Pre FEV1/FVC % % 58  Post FEV1/FCV % % 63  FEV1-Pre L 1.75  FEV1-Predicted Pre % 50  FEV1-Post L 2.09  DLCO UNC% % 75  DLCO COR %Predicted % 82  TLC L 7.59  TLC % Predicted % 111  RV % Predicted % 173    OV 03/16/18 - wheezing Patient presents today for a follow-up on wheezing after recent visit to Dr. Servando Snare. States that he feels good. Has quit smoking.  He quit smoking 5 days ago (Christmas Day).  He has been staying active.  No recent fevers or sinus congestion.  Denies any chest congestion.  He denies any chest pain or edema.  He has not started pulmonary rehab yet.  He is compliant with his Trelegy inhaler.  He has a  nebulizer for as needed albuterol neb treatments.  He also has a Proventil inhaler to use as needed for wheezing or shortness of breath.  He is also trying to work on a healthier diet.    Allergies  Allergen Reactions  . Lisinopril Hives and Itching  . Amitriptyline Other (See Comments)    Makes pt feel "weird" and nausea.  . Contrast Media [Iodinated Diagnostic Agents] Hives and Itching    Pt can use if taking benadryl   . Ibuprofen Other (See Comments)    Internal bleeding    Immunization History  Administered Date(s) Administered  . Influenza,inj,Quad PF,6+ Mos 12/18/2014, 01/22/2016  . Influenza-Unspecified 02/15/2017, 12/29/2017  . Pneumococcal-Unspecified 02/16/2015  . Tdap 09/01/2012    Past Medical History:  Diagnosis Date  . AC (acromioclavicular) joint bone spurs, unspecified laterality 11/25/2016  . Arthritis    "back" (05/16/2015)  . CAD in native artery, occluded Lcx and rotational atherectomy to LAD with DES 05/17/15 05/18/2015  . Cavitary lesion of lung 02/25/2018  . Chronic back pain    "all my back"  . Chronic bronchitis ([place])   . COPD (chronic obstructive pulmonary disease) ([place])   . Diabetes mellitus without complication ([place])   . GERD (gastroesophageal reflux disease)   . H/O blood clots    "had them in my back; put filter in before one of my neck ORs"  .  History of gout   . Hyperlipidemia   . Hypertension   . Neuromuscular disorder ([place])   . PAD (peripheral artery disease) ([place])   . Phlebitis   . Positive TB test   . Seasonal allergies   . Shortness of breath   . Spinal disease     Tobacco History: Social History   Tobacco Use  Smoking Status Former Smoker  . Packs/day: 0.75  . Years: 49.00  . Pack years: 36.75  . Types: Cigarettes  . Last attempt to quit: 03/11/2018  . Years since quitting: 0.0  Smokeless Tobacco Never Used  Tobacco Comment   half a pack a day 12/25/2017   Counseling given: Yes Comment: half a pack a day  12/25/2017   Outpatient Encounter Medications as of 03/16/2018  Medication Sig  . albuterol (PROVENTIL) (2.5 MG/3ML) 0.083% nebulizer solution Take 3 mLs (2.5 mg total) by nebulization every 6 (six) hours as needed for wheezing or shortness of breath.  [name] aspirin EC 81 MG tablet Take 81 mg by mouth daily.  [name] atorvastatin (LIPITOR) 40 MG tablet Take 1 tablet (40 mg total) by mouth daily at 6 PM.  . Blood Glucose Monitoring Suppl (ONETOUCH VERIO) w/Device KIT 1 Device by Does not apply route once.  . clopidogrel (PLAVIX) 75 MG tablet Take 1 tablet (75 mg total) by mouth daily.  . fexofenadine (ALLEGRA ALLERGY) 180 MG tablet Take 1 tablet (180 mg total) by mouth daily.  . Fluticasone-Umeclidin-Vilant (TRELEGY ELLIPTA) 100-62.5-25 MCG/INH AEPB Inhale 1 puff into the lungs daily.  . Fluticasone-Umeclidin-Vilant (TRELEGY ELLIPTA) 100-62.5-25 MCG/INH AEPB Inhale 1 puff into the lungs daily.  . nitroGLYCERIN (NITROSTAT) 0.4 MG SL tablet Place 1 tablet (0.4 mg total) under the tongue every 5 (five) minutes as needed for chest pain.  . Omega-3 Fatty Acids (FISH OIL) 1000 MG CAPS Take 1,000 mg by mouth daily.   [name] omeprazole (PRILOSEC) 40 MG capsule Take 1 capsule (40 mg total) by mouth daily. PATIENT NEEDS OFFICE VISIT FOR FURTHER REFILLS  . ONE TOUCH LANCETS MISC 1 each by Does not apply route 3 (three) times daily.  Glory Rosebush VERIO test strip USE TO TEST 3 TIMES DAILY  . OVER THE COUNTER MEDICATION Apply 1 application topically daily as needed (neuropathy pain). Hemp oil cream  . PROAIR HFA 108 (90 Base) MCG/ACT inhaler USE 2 PUFFS EVERY 6 HOURS  AS NEEDED FOR WHEEZING OR  SHORTNESS OF BREATH (Patient taking differently: Inhale 2 puffs into the lungs every 6 (six) hours as needed for wheezing or shortness of breath. )  . traZODone (DESYREL) 100 MG tablet TAKE 1 TABLET BY MOUTH AT  BEDTIME  . amLODipine (NORVASC) 5 MG tablet Take 1 tablet (5 mg total) by mouth daily.  . diphenhydrAMINE (BENADRYL) 50 MG  tablet Take 0.5 tablets (25 mg total) by mouth once. 1 hr prior to CT scan  . predniSONE (DELTASONE) 10 MG tablet Take 3 tabs for 2 days, then 2 tabs for 2 days, then 1 tab for 2 days, then stop   No facility-administered encounter medications on file as of 03/16/2018.      Review of Systems  Review of Systems  Constitutional: Negative.  Negative for chills and fever.  HENT: Negative.  Negative for congestion.   Respiratory: Negative for cough and shortness of breath.   Cardiovascular: Negative.  Negative for chest pain, palpitations and leg swelling.  Gastrointestinal: Negative.   Allergic/Immunologic: Negative.   Neurological: Negative.   Psychiatric/Behavioral: Negative.  Physical Exam  BP (!) 164/82 (BP Location: Left Arm, Patient Position: Sitting, Cuff Size: Normal)   Pulse 78   Ht '5\' 9"'  (1.753 m)   Wt 214 lb 6.4 oz (97.3 kg)   SpO2 98%   BMI 31.66 kg/m   Wt Readings from Last 5 Encounters:  03/16/18 214 lb 6.4 oz (97.3 kg)  03/12/18 206 lb (93.4 kg)  03/02/18 208 lb (94.3 kg)  02/25/18 209 lb (94.8 kg)  02/18/18 209 lb 1.6 oz (94.8 kg)     Physical Exam Vitals signs and nursing note reviewed.  Constitutional:      General: He is not in acute distress.    Appearance: He is well-developed.  Cardiovascular:     Rate and Rhythm: Normal rate and regular rhythm.  Pulmonary:     Effort: Pulmonary effort is normal. No respiratory distress.     Breath sounds: Normal breath sounds. No wheezing or rhonchi.  Musculoskeletal:        General: No swelling.  Skin:    General: Skin is warm and dry.  Neurological:     Mental Status: He is alert and oriented to person, place, and time.      Imaging: Dg Chest Port 1 View  Result Date: 02/25/2018 CLINICAL DATA:  Right upper lobe bronchoscopy and biopsy. EXAM: PORTABLE CHEST 1 VIEW COMPARISON:  CT chest dated February 16, 2018. FINDINGS: The heart size and mediastinal contours are within normal limits. Normal  pulmonary vascularity. Increased density in the anterior right upper lobe. No pleural effusion or pneumothorax. No acute osseous abnormality. IMPRESSION: 1. Post biopsy hemorrhage in the anterior right upper lobe. No pneumothorax. Electronically Signed   By: Titus Dubin M.D.   On: 02/25/2018 11:05   Ct Super D Chest Wo Contrast  Result Date: 02/16/2018 CLINICAL DATA:  Cavitary RIGHT upper lobe lesion. EXAM: CT CHEST WITHOUT CONTRAST TECHNIQUE: Multidetector CT imaging of the chest was performed using thin slice collimation for electromagnetic bronchoscopy planning purposes, without intravenous contrast. COMPARISON:  PET-CT 02/09/2018, CT 01/12/2018 FINDINGS: Cardiovascular: Coronary artery calcification and aortic atherosclerotic calcification. Mediastinum/Nodes: No axillary supraclavicular adenopathy. No mediastinal hilar adenopathy. No pericardial effusion. Esophagus. Lungs/Pleura: Thin wall cavitary lesion RIGHT upper lobe measures 3.0 x 2.5 cm compared to 3.3 x 2.7 cm for no significant change. No new lesions are present. Mild paraseptal emphysema in the more superior upper lobes. Upper Abdomen: Limited view of the liver, kidneys, pancreas are unremarkable. Normal adrenal glands. Musculoskeletal: No aggressive osseous lesion. IMPRESSION: 1. Thin wall cavitary RIGHT upper lobe lesion. 2. Coronary artery calcification and Aortic Atherosclerosis (ICD10-I70.0). Electronically Signed   By: Suzy Bouchard M.D.   On: 02/16/2018 14:34   Dg C-arm Bronchoscopy  Result Date: 02/25/2018 C-ARM BRONCHOSCOPY: Fluoroscopy was utilized by the requesting physician.  No radiographic interpretation.     Assessment & Plan:   Moderate COPD (chronic obstructive pulmonary disease) (H[facility]) Dr. Pia Mau was concerned about patient at his last visit due to wheezing.  He seems to be doing well today.  He is not wheezing.  I will give a short prednisone taper.  Advised him to continue neb treatments and Trelegy as  ordered.  We will also give samples of Mucinex today in office.   Patient Instructions  Congratulations on quitting smoking!!! Please continue trelegy Continue albuterol nebs and Proventil inhaler as needed Patient has not started pulmonary rehab yet that was ordered by Dr. Servando Snare  Please work on [employer] Information given in office today about free  nicotine replacement therapy Will order short prednisone taper and give samples of mucinex today Please keep upcoming follow up with Dr. Valeta Harms     Malignant neoplasm of upper lobe of right lung [facility]) Please keep upcoming appointments with cardiothoracic surgery  Former smoker Congratulations on quitting smoking! Card given in office today for free nicotine replacement therapy as needed      Fenton Foy, NP 03/16/2018

## 2018-03-25 ENCOUNTER — Encounter: Payer: Self-pay | Admitting: Pulmonary Disease

## 2018-03-25 ENCOUNTER — Ambulatory Visit (INDEPENDENT_AMBULATORY_CARE_PROVIDER_SITE_OTHER): Payer: Medicare Other | Admitting: Pulmonary Disease

## 2018-03-25 VITALS — BP 132/78 | HR 77 | Ht 69.0 in | Wt 218.4 lb

## 2018-03-25 DIAGNOSIS — J449 Chronic obstructive pulmonary disease, unspecified: Secondary | ICD-10-CM

## 2018-03-25 DIAGNOSIS — Z87891 Personal history of nicotine dependence: Secondary | ICD-10-CM

## 2018-03-25 DIAGNOSIS — C3411 Malignant neoplasm of upper lobe, right bronchus or lung: Secondary | ICD-10-CM | POA: Diagnosis not present

## 2018-03-25 MED ORDER — FLUTICASONE-UMECLIDIN-VILANT 100-62.5-25 MCG/INH IN AEPB: 1.0000 | INHALATION_SPRAY | Freq: Every day | RESPIRATORY_TRACT | 5 refills | Status: DC

## 2018-03-25 MED ORDER — FLUTICASONE-UMECLIDIN-VILANT 100-62.5-25 MCG/INH IN AEPB: 1.0000 | INHALATION_SPRAY | Freq: Every day | RESPIRATORY_TRACT | 0 refills | Status: DC

## 2018-03-25 MED ORDER — ALBUTEROL SULFATE HFA 108 (90 BASE) MCG/ACT IN AERS: INHALATION_SPRAY | RESPIRATORY_TRACT | 3 refills | Status: DC

## 2018-03-25 NOTE — Addendum Note (Signed)
Addended by: Vivia Ewing on: 03/25/2018 04:35 PM   Modules accepted: Orders

## 2018-03-25 NOTE — Progress Notes (Signed)
Synopsis: Referred in Oct. 2019 for COPD by Jearld Fenton, NP  Subjective:   PATIENT ID: Joshua [name] GENDER: male DOB: [birth year], MRN: [id number]  Chief Complaint  Patient presents with  . Follow-up    States he is here for check up per Dr. Servando Snare. States he is breathing much better since completing prednisone. Needs surgical clearance for lobe removal.     PMH of tobacco abuse, smoked since age of 22, at heaviest was 1ppd and now down to 0.5ppd. History of back surgery complicated by DVT. He has planned intervention by cardiology for his PVD on his right leg in two weeks.  He was treated with antibiotics and steroids recently by his primary care provider.  He is recently at the end of his steroid taper.  Presents to the office today with significant dyspnea on exertion, congestion, cough, sputum production as well as chest tightness and wheezing.  He has difficulty getting up and walking from his chair to the bathroom.  He states that he received a breathing treatment by his primary care doctor and felt much better for several hours after that.  He is still continuing to smoke and he knows that he needs to quit but he has been unable to stop. He has been incarcerated x2 each time for a little less than a year.  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  He denies IV drug use history.  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  OV 02/05/2018: Since the patient was last seen in the office he has been doing much better.  He has started using his trilogy inhaler regularly.  He has cut down smoking.  He is only smoking 3 to 4 cigarettes/day.  He is definitely trying to quit.  Since he was last seen in the office we also completed a low-dose lung cancer screening CT which was read as a lung RADS for a.  This had a enlarging right upper lobe cavitary/cystic lesion with areas of associated groundglass and semisolid material.  He does have dyspnea on  exertion.  His daily cough is somewhat better.  He still has sputum production.  OV 03/02/2018: Since our last office visit the patient was taken for bronchoscopy on 12/11, ENB/Radial EBUS s/p sampling of RUL lesion + for pulmonary adenocarcinoma, TTF-1 and Napsin positive.  Since he was last seen he also had pulmonary function tests completed which demonstrated an FEV1 60% predicted postbronchodilator, 2.0 L.  He has stopped smoking as of last week.  Patient denies hemoptysis.  He has been wheezing a little bit and using his nebulized treatments at home.  Today we discussed his biopsy results in the office with his wife as well as patient today in the office.  OV 03/25/2018: Patient seen today for follow-up after referral to [facility].  Patient was also seen by 1 of our nurse practitioners a few weeks ago.  His breathing is much better.  Of note he has quit smoking.  At this point he has been well managed on his triple therapy inhaler and as needed albuterol.  He is only needed his albuterol 2-3 times per day.  Overall no hemoptysis.  No additional wheezing.  He does feel like his functional status is better.  He was seen by Dr. Servando Snare for evaluation of lobectomy.  He was concerned due to his ongoing respiratory symptoms.  I do believe he has improved since he  was last seen by me in clinic.    Past Medical History:  Diagnosis Date  . AC (acromioclavicular) joint bone spurs, unspecified laterality 11/25/2016  . Arthritis    "back" (05/16/2015)  . CAD in native artery, occluded Lcx and rotational atherectomy to LAD with DES 05/17/15 05/18/2015  . Cavitary lesion of lung 02/25/2018  . Chronic back pain    "all my back"  . Chronic bronchitis ([place])   . COPD (chronic obstructive pulmonary disease) ([place])   . Diabetes mellitus without complication ([place])   . GERD (gastroesophageal reflux disease)   . H/O blood clots    "had them in my back; put filter in before one of my neck ORs"  . History of gout   .  Hyperlipidemia   . Hypertension   . Neuromuscular disorder ([place])   . PAD (peripheral artery disease) ([place])   . Phlebitis   . Positive TB test   . Seasonal allergies   . Shortness of breath   . Spinal disease      Family History  Problem Relation Age of Onset  . Stomach cancer Mother        Deceased, 5s  . Hypertension Mother   . Hypertension Father   . Stroke Father   . Heart attack Father        Deceased, 49  . Aneurysm Brother   . Healthy Daughter   . Healthy Maternal Grandmother   . Leukemia Grandchild   . Diabetes Neg Hx   . Early death Neg Hx   . Colon cancer Neg Hx   . Pancreatic cancer Neg Hx      Past Surgical History:  Procedure Laterality Date  . ABDOMINAL AORTOGRAM W/LOWER EXTREMITY  01/07/2018  . ABDOMINAL AORTOGRAM W/LOWER EXTREMITY N/A 01/07/2018   Procedure: ABDOMINAL AORTOGRAM W/LOWER EXTREMITY;  Surgeon: Wellington Hampshire, MD;  Location: [facility];  Service: Cardiovascular;  Laterality: N/A;  . ANTERIOR CERVICAL DECOMP/DISCECTOMY FUSION  ~ 2001-2009 X 3  . ANTERIOR CERVICAL DECOMP/DISCECTOMY FUSION N/A 10/06/2012   Procedure: ANTERIOR CERVICAL DECOMPRESSION/DISCECTOMY FUSION 1 LEVEL;  Surgeon: [name], MD;  Location: [facility];  Service: Neurosurgery;  Laterality: N/A;  C7T1 anterior cervical decompression with fusion plating and bonegraft   . BACK SURGERY    . CARDIAC CATHETERIZATION  05/16/2015  . CARDIAC CATHETERIZATION N/A 05/16/2015   Procedure: Left Heart Cath and Coronary Angiography;  Surgeon: [name], MD;  Location: [facility];  Service: Cardiovascular;  Laterality: N/A;  . CARDIAC CATHETERIZATION N/A 05/16/2015   Procedure: Coronary Balloon Angioplasty;  Surgeon: [name], MD;  Location: [facility];  Service: Cardiovascular;  Laterality: N/A;  . CARDIAC CATHETERIZATION N/A 05/17/2015   Procedure: Coronary Stent Intervention Rotoblater;  Surgeon: [name], MD;  Location: [facility];   Service: Cardiovascular;  Laterality: N/A;  . ELBOW SURGERY Left 2016   "had bone pieces removed"  . ESOPHAGEAL MANOMETRY N/A 12/30/2016   Procedure: ESOPHAGEAL MANOMETRY (EM);  Surgeon: Irene Shipper, MD;  Location: [facility];  Service: Endoscopy;  Laterality: N/A;  . EXCISIONAL HEMORRHOIDECTOMY    . FEMORAL BYPASS  ~ 2010  . IR RADIOLOGIST EVAL & MGMT  12/17/2016  . IVC FILTER PLACEMENT ([place] HX)  2010   "had blood clots in my back"  . PERIPHERAL VASCULAR ATHERECTOMY  01/07/2018   Procedure: PERIPHERAL VASCULAR ATHERECTOMY;  Surgeon: Wellington Hampshire, MD;  Location: [facility];  Service:  Cardiovascular;;  . PERIPHERAL VASCULAR BALLOON ANGIOPLASTY  01/07/2018   Procedure: PERIPHERAL VASCULAR BALLOON ANGIOPLASTY;  Surgeon: Wellington Hampshire, MD;  Location: [facility];  Service: Cardiovascular;;  . PERIPHERAL VASCULAR CATHETERIZATION N/A 02/14/2016   Procedure: Abdominal Aortogram w/Lower Extremity;  Surgeon: Wellington Hampshire, MD;  Location: [facility];  Service: Cardiovascular;  Laterality: N/A;  . POSTERIOR LAMINECTOMY / DECOMPRESSION LUMBAR SPINE  2006   "bone spurs"  . VIDEO BRONCHOSCOPY WITH ENDOBRONCHIAL NAVIGATION Right 02/25/2018   Procedure: VIDEO BRONCHOSCOPY WITH ENDOBRONCHIAL NAVIGATION;  Surgeon: Garner Nash, DO;  Location: [facility];  Service: Thoracic;  Laterality: Right;  [name] VIDEO BRONCHOSCOPY WITH ENDOBRONCHIAL ULTRASOUND Right 02/25/2018   Procedure: VIDEO BRONCHOSCOPY WITH ENDOBRONCHIAL ULTRASOUND;  Surgeon: Garner Nash, DO;  Location: [facility];  Service: Thoracic;  Laterality: Right;    Social History   Socioeconomic History  . Marital status: Married    Spouse name: Not on file  . Number of children: Not on file  . Years of education: 7  . Highest education level: Not on file  Occupational History  . Occupation: Disabled    Employer: DISABLED  Social Needs  . Financial resource strain: Not on file  . Food insecurity:    Worry: Not  on file    Inability: Not on file  . Transportation needs:    Medical: Not on file    Non-medical: Not on file  Tobacco Use  . Smoking status: Former Smoker    Packs/day: 0.75    Years: 49.00    Pack years: 36.75    Types: Cigarettes    Last attempt to quit: 03/11/2018    Years since quitting: 0.0  . Smokeless tobacco: Never Used  . Tobacco comment: half a pack a day 12/25/2017  Substance and Sexual Activity  . Alcohol use: Yes    Alcohol/week: 0.0 standard drinks    Comment: 2019 NO ALCOHOL IN 2 MONTHS  . Drug use: No  . Sexual activity: Not Currently  Lifestyle  . Physical activity:    Days per week: Not on file    Minutes per session: Not on file  . Stress: Not on file  Relationships  . Social connections:    Talks on phone: Not on file    Gets together: Not on file    Attends religious service: Not on file    Active member of club or organization: Not on file    Attends meetings of clubs or organizations: Not on file    Relationship status: Not on file  . Intimate partner violence:    Fear of current or ex partner: Not on file    Emotionally abused: Not on file    Physically abused: Not on file    Forced sexual activity: Not on file  Other Topics Concern  . Not on file  Social History Narrative   Regular exercise-no   Caffeine Use-yes   Lives with wife in a one story home. Has 2 children.  On disability for low back pain.  Used to work as a Games developer, lasted worked in 2007.     Education: 7th grade.     Allergies  Allergen Reactions  . Lisinopril Hives and Itching  . Amitriptyline Other (See Comments)    Makes pt feel "weird" and nausea.  . Contrast Media [Iodinated Diagnostic Agents] Hives and Itching    Pt can use if taking benadryl   . Ibuprofen Other (See Comments)    Internal  bleeding     Outpatient Medications Prior to Visit  Medication Sig Dispense Refill  . albuterol (PROVENTIL) (2.5 MG/3ML) 0.083% nebulizer solution Take 3 mLs (2.5 mg total)  by nebulization every 6 (six) hours as needed for wheezing or shortness of breath. 75 mL 5  . aspirin EC 81 MG tablet Take 81 mg by mouth daily.    [name] atorvastatin (LIPITOR) 40 MG tablet Take 1 tablet (40 mg total) by mouth daily at 6 PM. 90 tablet 1  . Blood Glucose Monitoring Suppl (ONETOUCH VERIO) w/Device KIT 1 Device by Does not apply route once. 1 kit 0  . clopidogrel (PLAVIX) 75 MG tablet Take 1 tablet (75 mg total) by mouth daily. 90 tablet 3  . fexofenadine (ALLEGRA ALLERGY) 180 MG tablet Take 1 tablet (180 mg total) by mouth daily. 90 tablet 3  . Fluticasone-Umeclidin-Vilant (TRELEGY ELLIPTA) 100-62.5-25 MCG/INH AEPB Inhale 1 puff into the lungs daily. 28 each 5  . Fluticasone-Umeclidin-Vilant (TRELEGY ELLIPTA) 100-62.5-25 MCG/INH AEPB Inhale 1 puff into the lungs daily. 2 each 0  . nitroGLYCERIN (NITROSTAT) 0.4 MG SL tablet Place 1 tablet (0.4 mg total) under the tongue every 5 (five) minutes as needed for chest pain. 25 tablet 4  . Omega-3 Fatty Acids (FISH OIL) 1000 MG CAPS Take 1,000 mg by mouth daily.     [name] omeprazole (PRILOSEC) 40 MG capsule Take 1 capsule (40 mg total) by mouth daily. PATIENT NEEDS OFFICE VISIT FOR FURTHER REFILLS 90 capsule 0  . ONE TOUCH LANCETS MISC 1 each by Does not apply route 3 (three) times daily. 200 each 5  . ONETOUCH VERIO test strip USE TO TEST 3 TIMES DAILY 100 each 1  . OVER THE COUNTER MEDICATION Apply 1 application topically daily as needed (neuropathy pain). Hemp oil cream    . predniSONE (DELTASONE) 10 MG tablet Take 3 tabs for 2 days, then 2 tabs for 2 days, then 1 tab for 2 days, then stop 12 tablet 0  . PROAIR HFA 108 (90 Base) MCG/ACT inhaler USE 2 PUFFS EVERY 6 HOURS  AS NEEDED FOR WHEEZING OR  SHORTNESS OF BREATH (Patient taking differently: Inhale 2 puffs into the lungs every 6 (six) hours as needed for wheezing or shortness of breath. ) 34 g 3  . traZODone (DESYREL) 100 MG tablet TAKE 1 TABLET BY MOUTH AT  BEDTIME 90 tablet 1  . amLODipine  (NORVASC) 5 MG tablet Take 1 tablet (5 mg total) by mouth daily. 90 tablet 1  . diphenhydrAMINE (BENADRYL) 50 MG tablet Take 0.5 tablets (25 mg total) by mouth once. 1 hr prior to CT scan 30 tablet 0   No facility-administered medications prior to visit.     Review of Systems  Constitutional: Negative for chills, fever, malaise/fatigue and weight loss.  HENT: Negative for hearing loss, sore throat and tinnitus.   Eyes: Negative for blurred vision and double vision.  Respiratory: Positive for shortness of breath. Negative for cough, hemoptysis, sputum production, wheezing and stridor.   Cardiovascular: Negative for chest pain, palpitations, orthopnea, leg swelling and PND.  Gastrointestinal: Negative for abdominal pain, constipation, diarrhea, heartburn, nausea and vomiting.  Genitourinary: Negative for dysuria, hematuria and urgency.  Musculoskeletal: Negative for joint pain and myalgias.  Skin: Negative for itching and rash.  Neurological: Negative for dizziness, tingling, weakness and headaches.  Endo/Heme/Allergies: Negative for environmental allergies. Does not bruise/bleed easily.  Psychiatric/Behavioral: Negative for depression. The patient is not nervous/anxious and does not have insomnia.   All other  systems reviewed and are negative.    Objective:  Physical Exam Vitals signs reviewed.  Constitutional:      General: He is not in acute distress.    Appearance: He is well-developed.  HENT:     Head: Normocephalic and atraumatic.  Eyes:     General: No scleral icterus.    Conjunctiva/sclera: Conjunctivae normal.     Pupils: Pupils are equal, round, and reactive to light.  Neck:     Musculoskeletal: Neck supple.     Vascular: No JVD.     Trachea: No tracheal deviation.  Cardiovascular:     Rate and Rhythm: Normal rate and regular rhythm.     Heart sounds: Normal heart sounds. No murmur.  Pulmonary:     Effort: Pulmonary effort is normal. No tachypnea, accessory muscle  usage or respiratory distress.     Breath sounds: No stridor. No wheezing, rhonchi or rales.     Comments: Bilateral diminished breath sounds, however no wheeze. Abdominal:     General: Bowel sounds are normal. There is no distension.     Palpations: Abdomen is soft.     Tenderness: There is no abdominal tenderness.  Musculoskeletal:        General: No tenderness.  Lymphadenopathy:     Cervical: No cervical adenopathy.  Skin:    General: Skin is warm and dry.     Capillary Refill: Capillary refill takes less than 2 seconds.     Findings: No rash.  Neurological:     Mental Status: He is alert and oriented to person, place, and time.  Psychiatric:        Behavior: Behavior normal.      Vitals:   03/25/18 1600  BP: 132/78  Pulse: 77  SpO2: 97%  Weight: 218 lb 6.4 oz (99.1 kg)  Height: '5\' 9"'  (1.753 m)   97% on RA BMI Readings from Last 3 Encounters:  03/25/18 32.25 kg/m  03/16/18 31.66 kg/m  03/12/18 30.42 kg/m   Wt Readings from Last 3 Encounters:  03/25/18 218 lb 6.4 oz (99.1 kg)  03/16/18 214 lb 6.4 oz (97.3 kg)  03/12/18 206 lb (93.4 kg)     CBC    Component Value Date/Time   WBC 11.0 (H) 02/18/2018 0923   RBC 5.31 02/18/2018 0923   HGB 17.2 (H) 02/18/2018 0923   HGB 16.4 01/02/2018 0927   HGB 19.2 (H) 11/25/2016 1430   HCT 52.0 02/18/2018 0923   HCT 46.2 01/02/2018 0927   HCT 54.8 (H) 11/25/2016 1430   PLT 226 02/18/2018 0923   PLT 269 01/02/2018 0927   MCV 97.9 02/18/2018 0923   MCV 97 01/02/2018 0927   MCV 92.3 11/25/2016 1430   MCH 32.4 02/18/2018 0923   MCHC 33.1 02/18/2018 0923   RDW 13.2 02/18/2018 0923   RDW 13.9 01/02/2018 0927   RDW 15.0 (H) 11/25/2016 1430   LYMPHSABS 2.5 11/25/2016 1430   MONOABS 1.3 (H) 11/25/2016 1430   EOSABS 0.3 11/25/2016 1430   BASOSABS 0.0 11/25/2016 1430    Chest Imaging: 2018 CT chest: Right lower lobe 6 mm pulmonary nodule stable from 2014 2018 on prior imaging.  02/16/2018 super D CT chest: Right upper  lobe cystic lesion. The patient's images have been independently reviewed by me.   Pulmonary Functions Testing Results:  PFT Results Latest Ref Rng & Units 02/05/2018  FVC-Pre L 3.05  FVC-Predicted Pre % 66  FVC-Post L 3.30  FVC-Predicted Post % 72  Pre FEV1/FVC % %  58  Post FEV1/FCV % % 63  FEV1-Pre L 1.75  FEV1-Predicted Pre % 50  FEV1-Post L 2.09  DLCO UNC% % 75  DLCO COR %Predicted % 82  TLC L 7.59  TLC % Predicted % 111  RV % Predicted % 173     FeNO: None  Pathology:   Bronchoscopy: 02/25/2018 ENB/Radial EBUS  + RUL NSCLC, Adenocarcinoma   Echocardiogram: None   Heart Catheterization:   Prox LAD to Mid LAD lesion, 90% stenosed. Post intervention, there remains 80% residual calcific stenosis.  Mid LAD lesion, 80% stenosed. Dist LAD lesion, 55% stenosed.  Prox Cx lesion, 100% stenosed. Prox Cx to Mid Cx lesion, 99% stenosed. Ost 2nd Mrg to 2nd Mrg lesion, 99% stenosed. -- Seen via retrograde filling from LAD/diagonal-OM collaterals  There is hyperdynamic left ventricular systolic function.  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  Longtime smoker recently quit.  Overall has been doing well since his bronchoscopy.  He was seen by cardiothoracic surgery which recommended optimization of pulmonary function prior to evaluation of lobectomy.  I believe from a pulmonary standpoint he is low to moderate risk for perioperative respiratory complications from a lobectomy.  He would benefit from scheduled nebulizers and early mobility following lobectomy.  Our pulmonary service would be happy to see the patient following surgery if needed.  Feel free to reach out to [place] if needed.  Continue Trelegy at this  time as well as as needed albuterol. Refills given.  Greater than 50% of this patient's 25-minute office was spent face-to-face discussing the recommendations and treatment plan.   Current Outpatient Medications:  .  albuterol (PROVENTIL) (2.5 MG/3ML) 0.083% nebulizer solution, Take 3 mLs (2.5 mg total) by nebulization every 6 (six) hours as needed for wheezing or shortness of breath., Disp: 75 mL, Rfl: 5 .  aspirin EC 81 MG tablet, Take 81 mg by mouth daily., Disp: , Rfl:  .  atorvastatin (LIPITOR) 40 MG tablet, Take 1 tablet (40 mg total) by mouth daily at 6 PM., Disp: 90 tablet, Rfl: 1 .  Blood Glucose Monitoring Suppl (ONETOUCH VERIO) w/Device KIT, 1 Device by Does not apply route once., Disp: 1 kit, Rfl: 0 .  clopidogrel (PLAVIX) 75 MG tablet, Take 1 tablet (75 mg total) by mouth daily., Disp: 90 tablet, Rfl: 3 .  fexofenadine (ALLEGRA ALLERGY) 180 MG tablet, Take 1 tablet (180 mg total) by mouth daily., Disp: 90 tablet, Rfl: 3 .  Fluticasone-Umeclidin-Vilant (TRELEGY ELLIPTA) 100-62.5-25 MCG/INH AEPB, Inhale 1 puff into the lungs daily., Disp: 28 each, Rfl: 5 .  Fluticasone-Umeclidin-Vilant (TRELEGY ELLIPTA) 100-62.5-25 MCG/INH AEPB, Inhale 1 puff into the lungs daily., Disp: 2 each, Rfl: 0 .  nitroGLYCERIN (NITROSTAT) 0.4 MG SL tablet, Place 1 tablet (0.4 mg total) under the tongue every 5 (five) minutes as needed for chest pain., Disp: 25 tablet, Rfl: 4 .  Omega-3 Fatty Acids (FISH OIL) 1000 MG CAPS, Take 1,000 mg by mouth daily. , Disp: , Rfl:  .  omeprazole (PRILOSEC) 40 MG capsule, Take 1 capsule (40 mg total) by mouth daily. PATIENT NEEDS OFFICE VISIT FOR FURTHER REFILLS, Disp: 90 capsule, Rfl: 0 .  ONE TOUCH LANCETS MISC, 1 each by Does not apply route 3 (three) times  daily., Disp: 200 each, Rfl: 5 .  ONETOUCH VERIO test strip, USE TO TEST 3 TIMES DAILY, Disp: 100 each, Rfl: 1 .  OVER THE COUNTER MEDICATION, Apply 1 application topically daily as needed (neuropathy pain). Hemp oil  cream, Disp: , Rfl:  .  predniSONE (DELTASONE) 10 MG tablet, Take 3 tabs for 2 days, then 2 tabs for 2 days, then 1 tab for 2 days, then stop, Disp: 12 tablet, Rfl: 0 .  PROAIR HFA 108 (90 Base) MCG/ACT inhaler, USE 2 PUFFS EVERY 6 HOURS  AS NEEDED FOR WHEEZING OR  SHORTNESS OF BREATH (Patient taking differently: Inhale 2 puffs into the lungs every 6 (six) hours as needed for wheezing or shortness of breath. ), Disp: 34 g, Rfl: 3 .  traZODone (DESYREL) 100 MG tablet, TAKE 1 TABLET BY MOUTH AT  BEDTIME, Disp: 90 tablet, Rfl: 1 .  amLODipine (NORVASC) 5 MG tablet, Take 1 tablet (5 mg total) by mouth daily., Disp: 90 tablet, Rfl: 1 .  diphenhydrAMINE (BENADRYL) 50 MG tablet, Take 0.5 tablets (25 mg total) by mouth once. 1 hr prior to CT scan, Disp: 30 tablet, Rfl: 0   Garner Nash, DO [facility] 03/25/2018 4:05 PM ]

## 2018-03-25 NOTE — Patient Instructions (Addendum)
Thank you for visiting Dr. Valeta Harms at [facility]. Today we recommend the following:  Meds ordered this encounter  Medications  . Fluticasone-Umeclidin-Vilant (TRELEGY ELLIPTA) 100-62.5-25 MCG/INH AEPB    Sig: Inhale 1 puff into the lungs daily.    Dispense:  2 each    Refill:  5    Order Specific Question:   Lot Number?    Answer:   [id number]    Order Specific Question:   Expiration Date?    Answer:   04/08/2019    Order Specific Question:   Manufacturer?    Answer:   [employer] [[id number]]    Order Specific Question:   Quantity    Answer:   2  . albuterol (PROAIR HFA) 108 (90 Base) MCG/ACT inhaler    Sig: USE 2 PUFFS EVERY 6 HOURS  AS NEEDED FOR WHEEZING OR  SHORTNESS OF BREATH    Dispense:  34 g    Refill:  3   Return in about 3 months (around 06/24/2018).

## 2018-03-26 ENCOUNTER — Other Ambulatory Visit: Payer: Self-pay

## 2018-03-26 ENCOUNTER — Encounter: Payer: Self-pay | Admitting: Cardiothoracic Surgery

## 2018-03-26 ENCOUNTER — Ambulatory Visit: Payer: Medicare Other | Admitting: Cardiothoracic Surgery

## 2018-03-26 VITALS — BP 171/104 | HR 76 | Resp 18 | Ht 69.0 in | Wt 217.0 lb

## 2018-03-26 DIAGNOSIS — C3411 Malignant neoplasm of upper lobe, right bronchus or lung: Secondary | ICD-10-CM

## 2018-03-26 NOTE — Progress Notes (Signed)
[address]       [place] [address]             [phone]                    Joshua [name] [facility] Medical Record #[id number] Date of Birth: Aug 02, 1955  Referring: Garner Nash, DO Primary Care: Jearld Fenton, NP Primary Cardiologist: Dr. Fletcher [name]  Chief Complaint:    Chief Complaint  Patient presents with  . Follow-up    further discuss surgery    History of Present Illness:    Joshua [name] 63 y.o. male is seen in the office  today for evaluation for possible right upper lobectomy.  The patient has a long history of smoking, recent exacerbations of COPD requiring courses of steroids and antibiotics x2.  Recent stenting of his right SFA artery in October, history of alcohol abuse, known coronary occlusive disease with multiple stents placed in 2017(occluded LCX, atherectomy/tandem DES mLAD 05/17/15)  ,  The patient had been followed with CT scans since 2014 with an enlarging right upper lobe cyst with groundglass opacities at the base of this. 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     Recent navigation bronchoscopy revealed adenocarcinoma.  PET scan shows no evidence of spread to lymph nodes or distant spread, lesion itself is not evident on PET scan.  PFTs have been done.    Patient notes that he is recently stopped his Plavix because of aching joints  Since last seen the patient is undergone aggressive respiratory treatment including bronchodilators, steroids, not smoking for the last 3 to 4 weeks, and Mucinex.  He notes improvement in his breathing, but is only been off the steroids for 4 days.   . Current Activity/ Functional Status:  Patient is independent with mobility/ambulation, transfers, ADL's, IADL's.   Zubrod Score: At the time of surgery this patient's most appropriate activity status/level should be described as: _0     0    Normal activity, no  symptoms _1     1    Restricted in physical strenuous activity but ambulatory, able to do out light work _2     2    Ambulatory and capable of self care, unable to do work activities, up and about               >50 % of waking hours                              _3     3    Only limited self care, in bed greater than 50% of waking hours _4     4    Completely disabled, no self care, confined to bed or chair _5     5    Moribund   Past Medical History:  Diagnosis Date  . AC (acromioclavicular) joint bone spurs, unspecified laterality 11/25/2016  . Arthritis    "back" (05/16/2015)  . CAD in native artery, occluded Lcx and rotational atherectomy to LAD with DES 05/17/15 05/18/2015  . Cavitary lesion of lung 02/25/2018  . Chronic back pain    "all my back"  . Chronic bronchitis ([place])   . COPD (chronic obstructive pulmonary disease) ([place])   . Diabetes mellitus without complication ([place])   . GERD (gastroesophageal reflux disease)   . H/O blood clots    "  had them in my back; put filter in before one of my neck ORs"  . History of gout   . Hyperlipidemia   . Hypertension   . Neuromuscular disorder ([place])   . PAD (peripheral artery disease) ([place])   . Phlebitis   . Positive TB test   . Seasonal allergies   . Shortness of breath   . Spinal disease     Past Surgical History:  Procedure Laterality Date  . ABDOMINAL AORTOGRAM W/LOWER EXTREMITY  01/07/2018  . ABDOMINAL AORTOGRAM W/LOWER EXTREMITY N/A 01/07/2018   Procedure: ABDOMINAL AORTOGRAM W/LOWER EXTREMITY;  Surgeon: Wellington Hampshire, MD;  Location: [facility];  Service: Cardiovascular;  Laterality: N/A;  . ANTERIOR CERVICAL DECOMP/DISCECTOMY FUSION  ~ 2001-2009 X 3  . ANTERIOR CERVICAL DECOMP/DISCECTOMY FUSION N/A 10/06/2012   Procedure: ANTERIOR CERVICAL DECOMPRESSION/DISCECTOMY FUSION 1 LEVEL;  Surgeon: [name], MD;  Location: [facility];  Service: Neurosurgery;  Laterality: N/A;  C7T1 anterior cervical decompression with  fusion plating and bonegraft   . BACK SURGERY    . CARDIAC CATHETERIZATION  05/16/2015  . CARDIAC CATHETERIZATION N/A 05/16/2015   Procedure: Left Heart Cath and Coronary Angiography;  Surgeon: [name], MD;  Location: [facility];  Service: Cardiovascular;  Laterality: N/A;  . CARDIAC CATHETERIZATION N/A 05/16/2015   Procedure: Coronary Balloon Angioplasty;  Surgeon: [name], MD;  Location: [facility];  Service: Cardiovascular;  Laterality: N/A;  . CARDIAC CATHETERIZATION N/A 05/17/2015   Procedure: Coronary Stent Intervention Rotoblater;  Surgeon: [name], MD;  Location: [facility];  Service: Cardiovascular;  Laterality: N/A;  . ELBOW SURGERY Left 2016   "had bone pieces removed"  . ESOPHAGEAL MANOMETRY N/A 12/30/2016   Procedure: ESOPHAGEAL MANOMETRY (EM);  Surgeon: Irene Shipper, MD;  Location: [facility];  Service: Endoscopy;  Laterality: N/A;  . EXCISIONAL HEMORRHOIDECTOMY    . FEMORAL BYPASS  ~ 2010  . IR RADIOLOGIST EVAL & MGMT  12/17/2016  . IVC FILTER PLACEMENT ([facility])  2010   "had blood clots in my back"  . PERIPHERAL VASCULAR ATHERECTOMY  01/07/2018   Procedure: PERIPHERAL VASCULAR ATHERECTOMY;  Surgeon: Wellington Hampshire, MD;  Location: [facility];  Service: Cardiovascular;;  . PERIPHERAL VASCULAR BALLOON ANGIOPLASTY  01/07/2018   Procedure: PERIPHERAL VASCULAR BALLOON ANGIOPLASTY;  Surgeon: Wellington Hampshire, MD;  Location: [facility];  Service: Cardiovascular;;  . PERIPHERAL VASCULAR CATHETERIZATION N/A 02/14/2016   Procedure: Abdominal Aortogram w/Lower Extremity;  Surgeon: Wellington Hampshire, MD;  Location: [facility];  Service: Cardiovascular;  Laterality: N/A;  . POSTERIOR LAMINECTOMY / DECOMPRESSION LUMBAR SPINE  2006   "bone spurs"  . VIDEO BRONCHOSCOPY WITH ENDOBRONCHIAL NAVIGATION Right 02/25/2018   Procedure: VIDEO BRONCHOSCOPY WITH ENDOBRONCHIAL NAVIGATION;  Surgeon: Garner Nash, DO;  Location:  [facility];  Service: Thoracic;  Laterality: Right;  [name] VIDEO BRONCHOSCOPY WITH ENDOBRONCHIAL ULTRASOUND Right 02/25/2018   Procedure: VIDEO BRONCHOSCOPY WITH ENDOBRONCHIAL ULTRASOUND;  Surgeon: Garner Nash, DO;  Location: [facility];  Service: Thoracic;  Laterality: Right;    Family History  Problem Relation Age of Onset  . Stomach cancer Mother        Deceased, 84s  . Hypertension Mother   . Hypertension Father   . Stroke Father   . Heart attack Father        Deceased, 27  . Aneurysm Brother   . Healthy Daughter   . Healthy Maternal  Grandmother   . Leukemia Grandchild   . Diabetes Neg Hx   . Early death Neg Hx   . Colon cancer Neg Hx   . Pancreatic cancer Neg Hx      Social History   Tobacco Use  Smoking Status Former Smoker  . Packs/day: 0.75  . Years: 49.00  . Pack years: 36.75  . Types: Cigarettes  . Last attempt to quit: 03/11/2018  . Years since quitting: 0.0  Smokeless Tobacco Never Used  Tobacco Comment   half a pack a day 12/25/2017    Social History   Substance and Sexual Activity  Alcohol Use Yes  . Alcohol/week: 0.0 standard drinks   Comment: 2019 NO ALCOHOL IN 2 MONTHS     Allergies  Allergen Reactions  . Lisinopril Hives and Itching  . Amitriptyline Other (See Comments)    Makes pt feel "weird" and nausea.  . Contrast Media [Iodinated Diagnostic Agents] Hives and Itching    Pt can use if taking benadryl   . Ibuprofen Other (See Comments)    Internal bleeding    Current Outpatient Medications  Medication Sig Dispense Refill  . albuterol (PROAIR HFA) 108 (90 Base) MCG/ACT inhaler USE 2 PUFFS EVERY 6 HOURS  AS NEEDED FOR WHEEZING OR  SHORTNESS OF BREATH 34 g 3  . albuterol (PROVENTIL) (2.5 MG/3ML) 0.083% nebulizer solution Take 3 mLs (2.5 mg total) by nebulization every 6 (six) hours as needed for wheezing or shortness of breath. 75 mL 5  . aspirin EC 81 MG tablet Take 81 mg by mouth daily.    [name] atorvastatin (LIPITOR) 40 MG tablet Take 1 tablet  (40 mg total) by mouth daily at 6 PM. 90 tablet 1  . Blood Glucose Monitoring Suppl (ONETOUCH VERIO) w/Device KIT 1 Device by Does not apply route once. 1 kit 0  . clopidogrel (PLAVIX) 75 MG tablet Take 1 tablet (75 mg total) by mouth daily. 90 tablet 3  . fexofenadine (ALLEGRA ALLERGY) 180 MG tablet Take 1 tablet (180 mg total) by mouth daily. 90 tablet 3  . Fluticasone-Umeclidin-Vilant (TRELEGY ELLIPTA) 100-62.5-25 MCG/INH AEPB Inhale 1 puff into the lungs daily. 28 each 5  . Fluticasone-Umeclidin-Vilant (TRELEGY ELLIPTA) 100-62.5-25 MCG/INH AEPB Inhale 1 puff into the lungs daily. 2 each 5  . Fluticasone-Umeclidin-Vilant (TRELEGY ELLIPTA) 100-62.5-25 MCG/INH AEPB Inhale 1 puff into the lungs daily. 60 each 5  . nitroGLYCERIN (NITROSTAT) 0.4 MG SL tablet Place 1 tablet (0.4 mg total) under the tongue every 5 (five) minutes as needed for chest pain. 25 tablet 4  . Omega-3 Fatty Acids (FISH OIL) 1000 MG CAPS Take 1,000 mg by mouth daily.     [name] omeprazole (PRILOSEC) 40 MG capsule Take 1 capsule (40 mg total) by mouth daily. PATIENT NEEDS OFFICE VISIT FOR FURTHER REFILLS 90 capsule 0  . ONE TOUCH LANCETS MISC 1 each by Does not apply route 3 (three) times daily. 200 each 5  . ONETOUCH VERIO test strip USE TO TEST 3 TIMES DAILY 100 each 1  . OVER THE COUNTER MEDICATION Apply 1 application topically daily as needed (neuropathy pain). Hemp oil cream    . predniSONE (DELTASONE) 10 MG tablet Take 3 tabs for 2 days, then 2 tabs for 2 days, then 1 tab for 2 days, then stop 12 tablet 0  . traZODone (DESYREL) 100 MG tablet TAKE 1 TABLET BY MOUTH AT  BEDTIME 90 tablet 1  . amLODipine (NORVASC) 5 MG tablet Take 1 tablet (5 mg total) by  mouth daily. 90 tablet 1  . diphenhydrAMINE (BENADRYL) 50 MG tablet Take 0.5 tablets (25 mg total) by mouth once. 1 hr prior to CT scan 30 tablet 0   No current facility-administered medications for this visit.     Pertinent items are noted in HPI.   Review of Systems:      Cardiac Review of Systems: [Y] = yes  or   [ N ] = no   Chest Pain Aqua.Slicker  ]  Resting SOB [ N ] Exertional SOB  [Y]  Orthopnea [ N ]   Pedal Edema Aqua.Slicker ]    Palpitations Aqua.Slicker ] Syncope  Aqua.Slicker ]   Presyncope Aqua.Slicker  ]   General Review of Systems: [Y] = yes [  ]=no Constitional: recent weight change [  ];  Wt loss over the last 3 months [   ] anorexia [  ]; fatigue [  ]; nausea [  ]; night sweats [  ]; fever [  ]; or chills [  ];           Eye : blurred vision [  ]; diplopia [   ]; vision changes [  ];  Amaurosis fugax[  ]; Resp: cough [ y ];  wheezing[ y ];  hemoptysis[  ]; shortness of breathY ]; paroxysmal nocturnal dyspnea[  ]; dyspnea on exertion[ y ]; or orthopnea[  ];  GI:  gallstones[  ], vomiting[  ];  dysphagia[  ]; melena[  ];  hematochezia [  ]; heartburn[  ];   Hx of  Colonoscopy[  ]; GU: kidney stones [  ]; hematuria[  ];   dysuria [  ];  nocturia[  ];  history of     obstruction [  ]; urinary frequency [  ]             Skin: rash, swelling[  ];, hair loss[  ];  peripheral edema[  ];  or itching[  ]; Musculosketetal: myalgias[  ];  joint swelling[  ];  joint erythema[  ];  joint pain[  ];  back pain[  ];  Heme/Lymph: bruising[  ];  bleeding[  ];  anemia[  ];  Neuro: TIA[  ];  headaches[  ];  stroke[  ];  vertigo[  ];  seizures[  ];   paresthesias[  ];  difficulty walking[  ];  Psych:depression[  ]; anxiety[  ];  Endocrine: diabetes[y on no meds   ];  thyroid dysfunction[  ];  Immunizations: Flu up to date [Y]; Pneumococcal up to date [ N];  Other:     PHYSICAL EXAMINATION: BP (!) 171/104 (BP Location: Right Arm, Patient Position: Sitting, Cuff Size: Normal)   Pulse 76   Resp 18   Ht _0  (1.753 m)   Wt 217 lb (98.4 kg)   SpO2 100% Comment: RA  BMI 32.05 kg/m  General appearance: alert, cooperative, appears older than stated age and no distress Head: Normocephalic, without obvious abnormality, atraumatic Lymph nodes: Cervical, supraclavicular, and axillary nodes normal. Resp:  diminished breath sounds bibasilar Cardio: regular rate and rhythm, S1, S2 normal, no murmur, click, rub or gallop GI: soft, non-tender; bowel sounds normal; no masses,  no organomegaly Extremities: extremities normal, atraumatic, no cyanosis or edema and Homans sign is negative, no sign of DVT Neurologic: Grossly normal Patient has midline abdominal incision from previous open aortobifem   Diagnostic Studies & Laboratory data:     Recent Radiology Findings:   Dg Chest [facility] 1 [address]  Result Date: 02/25/2018 CLINICAL DATA:  Right upper lobe bronchoscopy and biopsy. EXAM: PORTABLE CHEST 1 VIEW COMPARISON:  CT chest dated February 16, 2018. FINDINGS: The heart size and mediastinal contours are within normal limits. Normal pulmonary vascularity. Increased density in the anterior right upper lobe. No pleural effusion or pneumothorax. No acute osseous abnormality. IMPRESSION: 1. Post biopsy hemorrhage in the anterior right upper lobe. No pneumothorax. Electronically Signed   By: Titus Dubin M.D.   On: 02/25/2018 11:05   Ct Super D Chest Wo Contrast  Result Date: 02/16/2018 CLINICAL DATA:  Cavitary RIGHT upper lobe lesion. EXAM: CT CHEST WITHOUT CONTRAST TECHNIQUE: Multidetector CT imaging of the chest was performed using thin slice collimation for electromagnetic bronchoscopy planning purposes, without intravenous contrast. COMPARISON:  PET-CT 02/09/2018, CT 01/12/2018 FINDINGS: Cardiovascular: Coronary artery calcification and aortic atherosclerotic calcification. Mediastinum/Nodes: No axillary supraclavicular adenopathy. No mediastinal hilar adenopathy. No pericardial effusion. Esophagus. Lungs/Pleura: Thin wall cavitary lesion RIGHT upper lobe measures 3.0 x 2.5 cm compared to 3.3 x 2.7 cm for no significant change. No new lesions are present. Mild paraseptal emphysema in the more superior upper lobes. Upper Abdomen: Limited view of the liver, kidneys, pancreas are unremarkable. Normal adrenal  glands. Musculoskeletal: No aggressive osseous lesion. IMPRESSION: 1. Thin wall cavitary RIGHT upper lobe lesion. 2. Coronary artery calcification and Aortic Atherosclerosis (ICD10-I70.0). Electronically Signed   By: Suzy Bouchard M.D.   On: 02/16/2018 14:34   Dg C-arm Bronchoscopy  Result Date: 02/25/2018 C-ARM BRONCHOSCOPY: Fluoroscopy was utilized by the requesting physician.  No radiographic interpretation.     I have independently reviewed the above radiology studies  and reviewed the findings with the patient.   Recent Lab Findings: Lab Results  Component Value Date   WBC 11.0 (H) 02/18/2018   HGB 17.2 (H) 02/18/2018   HCT 52.0 02/18/2018   PLT 226 02/18/2018   GLUCOSE 103 (H) 02/18/2018   CHOL 215 (H) 11/21/2017   TRIG 94 11/21/2017   HDL 126 11/21/2017   LDLDIRECT 107.0 04/21/2015   LDLCALC 70 11/21/2017   ALT 46 (H) 02/18/2018   AST 46 (H) 02/18/2018   NA 139 02/18/2018   K 4.4 02/18/2018   CL 104 02/18/2018   CREATININE 0.91 02/18/2018   BUN 6 (L) 02/18/2018   CO2 21 (L) 02/18/2018   INR 0.92 02/18/2018   HGBA1C 5.1 02/18/2018   PFT's FEV! 1.75  50 DLCO 23.38 75% INTERPRETATION: Technique: Good technique. SPIROMETRY: Moderate Airflow Limitation, moderate obstructive defect Bronchodilator response: Significant improvement in airflows after bronchodilator. DLCO: Decreased diffusion capacity that corrects to the normal range when adjusted for the inhaled alveolar volume. LUNG VOLUMES: Lung volumes demonstrate hyperinflation  PATH: Diagnosis 1. Lung, biopsy, Right Upper Lobe - NON-SMALL CELL CARCINOMA, SEE COMMENT. 2. Lung, biopsy, Right upper lobe - BRONCHIAL MUCOSA WITH NO SIGNIFICANT PATHOLOGIC FINDINGS. Microscopic Comment 1. Immunohistochemistry for TTF-1 and Napsin-A is positive and PAX 8 is negative, consistent with pulmonary adenocarcinoma. Results reported to Dr. June Leap on 02/27/2018. Intradepartmental consultation was obtained (Dr.  Tresa Moore - 1). Gillie Manners MD Pathologist, Electronic Signature  Cardiac cath 05/16/15:  Prox LAD to Mid LAD lesion, 90% stenosed. Post intervention, there remains 80% residual calcific stenosis.  Mid LAD lesion, 80% stenosed. Dist LAD lesion, 55% stenosed.  Prox Cx lesion, 100% stenosed. Prox Cx to Mid Cx lesion, 99% stenosed. Ost 2nd Mrg to 2nd Mrg lesion, 99% stenosed. -- Seen via retrograde filling from LAD/diagonal-OM collaterals  There is hyperdynamic left ventricular systolic function.  Elevated LVEDP Severe 2 site lesion in the LAD. Very rigid lesions that would not yield to Cutting Balloon or noncompliant balloon angioplasty. These lesions are likely best treated with rotational atherectomy after review with Dr. Peter Martinique. Plan: Monitor overnight following PTCA. We'll plan for return to the Cath Lab tomorrow for rotational atherectomy of the LAD with stenting of the 2 lesions.  PCI 05/17/15:  Mid LAD lesion, 80% stenosed. Post intervention, there is a 0% residual stenosis.  Prox LAD to Mid LAD lesion, 80% stenosed. Post intervention, there is a 0% residual stenosis. The lesion was previously treated with angioplasty. Successful rotational atherectomy and stenting of tandem lesions in the mid LAD with DES. Plan: DAPT for one year. Anticipate DC in am.  Recent aortagram 01/07/2018 Aortogram with BLE runoff 01/07/18: 1. Widely patent aortobifemoral bypass with no obstructive disease. 2. Right lower extremity: Severe calcified stenosis affecting the ostial SFA with moderate calcified disease in the mid and distal segment, three-vessel runoff below the knee. 3. Left lower extremity: 60% ostial stenosis of the SFA with no other obstructive disease. 4. Successful orbital atherectomy and drug-coated balloon angioplasty to the ostial and proximal right SFA. Recommendations: The patient is already on dual antiplatelet therapy. Continue aggressive treatment of risk  factors.  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  We discussed the treatment options for stage I lung cancer including resection, which in this case would require lobectomy versus stereotactic radiotherapy.  The patient has significant underlying medical conditions .  His respiratory status has improved since I saw him several weeks ago, but this was with intensive therapy with bronchodilators and steroids which he just stopped 4 days ago.  Patient had clearance by cardiology over the telephone , after talking to the patient examining and reviewing his history I suspect his cardiac risk is higher than estimated by cardiology .  The patient notes he had no symptoms at the time of his coronary stents placed in 2017   I further discussed with the patient risk of surgical resection versus consideration for stereotactic radiotherapy.  At this point will refer him to radiation oncology to consider stereotactic radiotherapy to the right upper lobe, then with continued aggressive treatment of his underlying pulmonary disease, avoiding smoking, and serial CT scans post radiation.    Grace Isaac MD      [address] [place] [address] Office [phone]   Beeper [phone]  03/26/2018 10:32 AM

## 2018-04-01 ENCOUNTER — Ambulatory Visit (INDEPENDENT_AMBULATORY_CARE_PROVIDER_SITE_OTHER): Payer: Medicare Other | Admitting: Internal Medicine

## 2018-04-01 ENCOUNTER — Encounter: Payer: Self-pay | Admitting: Internal Medicine

## 2018-04-01 ENCOUNTER — Ambulatory Visit (HOSPITAL_COMMUNITY)
Admission: RE | Admit: 2018-04-01 | Discharge: 2018-04-01 | Disposition: A | Discharge status: Home / Self Care | Payer: Medicare Other | Source: Ambulatory Visit | Attending: Internal Medicine | Admitting: Internal Medicine

## 2018-04-01 VITALS — BP 156/98 | HR 76 | Temp 98.2°F | Wt 215.0 lb

## 2018-04-01 DIAGNOSIS — R110 Nausea: Secondary | ICD-10-CM

## 2018-04-01 DIAGNOSIS — J449 Chronic obstructive pulmonary disease, unspecified: Secondary | ICD-10-CM | POA: Diagnosis not present

## 2018-04-01 DIAGNOSIS — R1031 Right lower quadrant pain: Secondary | ICD-10-CM | POA: Diagnosis not present

## 2018-04-01 DIAGNOSIS — R140 Abdominal distension (gaseous): Secondary | ICD-10-CM | POA: Insufficient documentation

## 2018-04-01 DIAGNOSIS — K5730 Diverticulosis of large intestine without perforation or abscess without bleeding: Secondary | ICD-10-CM | POA: Diagnosis not present

## 2018-04-01 LAB — CBC
HCT: 49.8 % (ref 39.0–52.0)
Hemoglobin: 17 g/dL (ref 13.0–17.0)
MCHC: 34.1 g/dL (ref 30.0–36.0)
MCV: 93.8 fl (ref 78.0–100.0)
Platelets: 227 10*3/uL (ref 150.0–400.0)
RBC: 5.3 Mil/uL (ref 4.22–5.81)
RDW: 14 % (ref 11.5–15.5)
WBC: 11.7 10*3/uL — ABNORMAL HIGH (ref 4.0–10.5)

## 2018-04-01 LAB — AMYLASE: Amylase: 30 U/L (ref 27–131)

## 2018-04-01 LAB — COMPREHENSIVE METABOLIC PANEL
ALT: 24 U/L (ref 0–53)
AST: 21 U/L (ref 0–37)
Albumin: 4.1 g/dL (ref 3.5–5.2)
Alkaline Phosphatase: 75 U/L (ref 39–117)
BUN: 13 mg/dL (ref 6–23)
CO2: 29 mEq/L (ref 19–32)
CREATININE: 0.92 mg/dL (ref 0.40–1.50)
Calcium: 9.5 mg/dL (ref 8.4–10.5)
Chloride: 101 mEq/L (ref 96–112)
GFR: 88.57 mL/min (ref >60.00)
Glucose, Bld: 129 mg/dL — ABNORMAL HIGH (ref 70–99)
Potassium: 3.7 mEq/L (ref 3.5–5.1)
Sodium: 137 mEq/L (ref 135–145)
Total Bilirubin: 0.2 mg/dL (ref 0.2–1.2)
Total Protein: 6.8 g/dL (ref 6.0–8.3)

## 2018-04-01 LAB — LIPASE: Lipase: 30 U/L (ref 11.0–59.0)

## 2018-04-01 NOTE — Patient Instructions (Signed)

## 2018-04-01 NOTE — Progress Notes (Signed)
Subjective:    Patient ID: Joshua [name], male    DOB: 18-Aug-1955, 63 y.o.   MRN: [id number]  HPI  Patient presents to the clinic today with complaint of abdominal bloating and pain.  He reports this started 3 to 4 days ago.  The pain is located in the right lower abdomen.  He describes the pain as burning and throbbing.  The pain radiates into his back some.  The pain is worse with eating, taking a deep breath and bending over.  He denies any injury to the area.  He does have some associated nausea but denies vomiting, diarrhea, constipation or blood in his stool.  He denies fever, chills or body aches.  He denies urinary symptoms.  He takes omeprazole daily for reflux, but reports this feels different.  He denies recent changes in diet or medications.  He was recently diagnosed with lung cancer and is due to start radiation tomorrow.  Review of Systems      Past Medical History:  Diagnosis Date  . AC (acromioclavicular) joint bone spurs, unspecified laterality 11/25/2016  . Arthritis    "back" (05/16/2015)  . CAD in native artery, occluded Lcx and rotational atherectomy to LAD with DES 05/17/15 05/18/2015  . Cavitary lesion of lung 02/25/2018  . Chronic back pain    "all my back"  . Chronic bronchitis ([place])   . COPD (chronic obstructive pulmonary disease) ([place])   . Diabetes mellitus without complication ([place])   . GERD (gastroesophageal reflux disease)   . H/O blood clots    "had them in my back; put filter in before one of my neck ORs"  . History of gout   . Hyperlipidemia   . Hypertension   . Neuromuscular disorder ([place])   . PAD (peripheral artery disease) ([place])   . Phlebitis   . Positive TB test   . Seasonal allergies   . Shortness of breath   . Spinal disease     Current Outpatient Medications  Medication Sig Dispense Refill  . albuterol (PROAIR HFA) 108 (90 Base) MCG/ACT inhaler USE 2 PUFFS EVERY 6 HOURS&nbsp;&nbsp;AS NEEDED FOR WHEEZING OR&nbsp;&nbsp;SHORTNESS OF  BREATH 34 g 3  . albuterol (PROVENTIL) (2.5 MG/3ML) 0.083% nebulizer solution Take 3 mLs (2.5 mg total) by nebulization every 6 (six) hours as needed for wheezing or shortness of breath. 75 mL 5  . aspirin EC 81 MG tablet Take 81 mg by mouth daily.    [name] atorvastatin (LIPITOR) 40 MG tablet Take 1 tablet (40 mg total) by mouth daily at 6 PM. 90 tablet 1  . Blood Glucose Monitoring Suppl (ONETOUCH VERIO) w/Device KIT 1 Device by Does not apply route once. 1 kit 0  . clopidogrel (PLAVIX) 75 MG tablet Take 1 tablet (75 mg total) by mouth daily. 90 tablet 3  . fexofenadine (ALLEGRA ALLERGY) 180 MG tablet Take 1 tablet (180 mg total) by mouth daily. 90 tablet 3  . Fluticasone-Umeclidin-Vilant (TRELEGY ELLIPTA) 100-62.5-25 MCG/INH AEPB Inhale 1 puff into the lungs daily. 28 each 5  . Fluticasone-Umeclidin-Vilant (TRELEGY ELLIPTA) 100-62.5-25 MCG/INH AEPB Inhale 1 puff into the lungs daily. 2 each 5  . nitroGLYCERIN (NITROSTAT) 0.4 MG SL tablet Place 1 tablet (0.4 mg total) under the tongue every 5 (five) minutes as needed for chest pain. 25 tablet 4  . Omega-3 Fatty Acids (FISH OIL) 1000 MG CAPS Take 1,000 mg by mouth daily.     [name] omeprazole (PRILOSEC) 40 MG capsule Take 1 capsule (40 mg total)  by mouth daily. PATIENT NEEDS OFFICE VISIT FOR FURTHER REFILLS 90 capsule 0  . ONE TOUCH LANCETS MISC 1 each by Does not apply route 3 (three) times daily. 200 each 5  . ONETOUCH VERIO test strip USE TO TEST 3 TIMES DAILY 100 each 1  . OVER THE COUNTER MEDICATION Apply 1 application topically daily as needed (neuropathy pain). Hemp oil cream    . traZODone (DESYREL) 100 MG tablet TAKE 1 TABLET BY MOUTH AT  BEDTIME 90 tablet 1  . amLODipine (NORVASC) 5 MG tablet Take 1 tablet (5 mg total) by mouth daily. 90 tablet 1  . diphenhydrAMINE (BENADRYL) 50 MG tablet Take 0.5 tablets (25 mg total) by mouth once. 1 hr prior to CT scan 30 tablet 0   No current facility-administered medications for this visit.     Allergies    Allergen Reactions  . Lisinopril Hives and Itching  . Amitriptyline Other (See Comments)    Makes pt feel "weird" and nausea.  . Contrast Media [Iodinated Diagnostic Agents] Hives and Itching    Pt can use if taking benadryl   . Ibuprofen Other (See Comments)    Internal bleeding    Family History  Problem Relation Age of Onset  . Stomach cancer Mother        Deceased, 53s  . Hypertension Mother   . Hypertension Father   . Stroke Father   . Heart attack Father        Deceased, 41  . Aneurysm Brother   . Healthy Daughter   . Healthy Maternal Grandmother   . Leukemia Grandchild   . Diabetes Neg Hx   . Early death Neg Hx   . Colon cancer Neg Hx   . Pancreatic cancer Neg Hx     Social History   Socioeconomic History  . Marital status: Married    Spouse name: Not on file  . Number of children: Not on file  . Years of education: 7  . Highest education level: Not on file  Occupational History  . Occupation: Disabled    Employer: DISABLED  Social Needs  . Financial resource strain: Not on file  . Food insecurity:    Worry: Not on file    Inability: Not on file  . Transportation needs:    Medical: Not on file    Non-medical: Not on file  Tobacco Use  . Smoking status: Former Smoker    Packs/day: 0.75    Years: 49.00    Pack years: 36.75    Types: Cigarettes    Last attempt to quit: 03/11/2018    Years since quitting: 0.0  . Smokeless tobacco: Never Used  . Tobacco comment: half a pack a day 12/25/2017  Substance and Sexual Activity  . Alcohol use: Yes    Alcohol/week: 0.0 standard drinks    Comment: 2019 NO ALCOHOL IN 2 MONTHS  . Drug use: No  . Sexual activity: Not Currently  Lifestyle  . Physical activity:    Days per week: Not on file    Minutes per session: Not on file  . Stress: Not on file  Relationships  . Social connections:    Talks on phone: Not on file    Gets together: Not on file    Attends religious service: Not on file    Active member  of club or organization: Not on file    Attends meetings of clubs or organizations: Not on file    Relationship status: Not on file  .  Intimate partner violence:    Fear of current or ex partner: Not on file    Emotionally abused: Not on file    Physically abused: Not on file    Forced sexual activity: Not on file  Other Topics Concern  . Not on file  Social History Narrative   Regular exercise-no   Caffeine Use-yes   Lives with wife in a one story home. Has 2 children.  On disability for low back pain.  Used to work as a Games developer, lasted worked in 2007.     Education: 7th grade.     Constitutional: Denies fever, malaise, fatigue, headache or abrupt weight changes.  Respiratory: Denies difficulty breathing, shortness of breath, cough or sputum production.   Cardiovascular: Denies chest pain, chest tightness, palpitations or swelling in the hands or feet.  Gastrointestinal: Patient reports abdominal pain and bloating.  Denies constipation, diarrhea or blood in the stool.  GU: Denies urgency, frequency, pain with urination, burning sensation, blood in urine, odor or discharge. Skin: Denies redness, rashes, lesions or ulcercations.   No other specific complaints in a complete review of systems (except as listed in HPI above).  Objective:   Physical Exam   BP (!) 156/98   Pulse 76   Temp 98.2 F (36.8 C) (Oral)   Wt 215 lb (97.5 kg)   SpO2 97%   BMI 31.75 kg/m  Wt Readings from Last 3 Encounters:  04/01/18 215 lb (97.5 kg)  03/26/18 217 lb (98.4 kg)  03/25/18 218 lb 6.4 oz (99.1 kg)    General: Appears his stated age, obese, in NAD. Skin: Warm, dry and intact.  3 cm oval bruise noted to right upper quadrant. Cardiovascular: Normal rate and rhythm. S1,S2 noted.  No murmur, rubs or gallops noted. No JVD or BLE edema. No carotid bruits noted. Pulmonary/Chest: Normal effort and positive vesicular breath sounds. No respiratory distress. No wheezes, rales or ronchi noted.    Abdomen: Soft and generally tender but worse in the right lower quadrant.  Positive rebound tenderness.  Normal bowel sounds.  Ventral hernia noted. Liver, spleen and kidneys non palpable. Musculoskeletal: Negative psoas sign.   Neurological: Alert and oriented.   BMET    Component Value Date/Time   NA 139 02/18/2018 0923   NA 139 01/02/2018 0927   NA 140 11/25/2016 1430   K 4.4 02/18/2018 0923   K 4.3 11/25/2016 1430   CL 104 02/18/2018 0923   CO2 21 (L) 02/18/2018 0923   CO2 27 11/25/2016 1430   GLUCOSE 103 (H) 02/18/2018 0923   GLUCOSE 101 11/25/2016 1430   BUN 6 (L) 02/18/2018 0923   BUN 12 01/02/2018 0927   BUN 12.6 11/25/2016 1430   CREATININE 0.91 02/18/2018 0923   CREATININE 0.9 11/25/2016 1430   CALCIUM 9.3 02/18/2018 0923   CALCIUM 10.3 11/25/2016 1430   GFRNONAA >60 02/18/2018 0923   GFRAA >60 02/18/2018 0923    Lipid Panel     Component Value Date/Time   CHOL 215 (H) 11/21/2017 0810   TRIG 94 11/21/2017 0810   HDL 126 11/21/2017 0810   CHOLHDL 1.7 11/21/2017 0810   CHOLHDL 3 10/14/2016 1440   VLDL 25.0 10/14/2016 1440   LDLCALC 70 11/21/2017 0810    CBC    Component Value Date/Time   WBC 11.0 (H) 02/18/2018 0923   RBC 5.31 02/18/2018 0923   HGB 17.2 (H) 02/18/2018 0923   HGB 16.4 01/02/2018 0927   HGB 19.2 (H) 11/25/2016 1430  HCT 52.0 02/18/2018 0923   HCT 46.2 01/02/2018 0927   HCT 54.8 (H) 11/25/2016 1430   PLT 226 02/18/2018 0923   PLT 269 01/02/2018 0927   MCV 97.9 02/18/2018 0923   MCV 97 01/02/2018 0927   MCV 92.3 11/25/2016 1430   MCH 32.4 02/18/2018 0923   MCHC 33.1 02/18/2018 0923   RDW 13.2 02/18/2018 0923   RDW 13.9 01/02/2018 0927   RDW 15.0 (H) 11/25/2016 1430   LYMPHSABS 2.5 11/25/2016 1430   MONOABS 1.3 (H) 11/25/2016 1430   EOSABS 0.3 11/25/2016 1430   BASOSABS 0.0 11/25/2016 1430    Hgb A1C Lab Results  Component Value Date   HGBA1C 5.1 02/18/2018           Assessment & Plan:  Nausea, Abdominal Bloating,  RLQ Abdominal Pain:  Concern for acute appendicitis CBC, C met, amylase and lipase today STAT CT scan abdomen with contrast to rule out appendicitis Allergy to contrast dye, will need to take 50 mg of Benadryl p.o. 1 hour prior to CT scan Advised him if pain increases or develops fever, chills to go to the nearest ER  We will follow-up after CT scan and labs are available for review, return precautions discussed Webb Silversmith, NP

## 2018-04-01 NOTE — Addendum Note (Signed)
Addended by: Lindalou Hose Y on: 04/01/2018 04:00 PM   Modules accepted: Orders

## 2018-04-01 NOTE — Progress Notes (Signed)
Thoracic Location of Tumor / Histology: Malignant neoplasm of upper lobe, right bronchus or lung  Patient presented SOB that worsened.  Diagnosed with bronchitis.   CT Super D Chest 02/16/2018: Thin wall cavitary lesion right upper lobe measures 3.0 x 2.5 cm compared to 3.3 x 2.7 cm for no significant change.  PET 02/09/2018: No hypermetabolism in the thin walled cavitary right upper lobe lesion.  No hypermetabolic lymphadenopathy in the chest.  no evidence of spread to lymph nodes or distant spread, lesion itself is not evident on PET.  CT Chest 01/12/2018: 5.8 mm right lower lobe nodule, unchanged from prior CT.  3.3 x 2.7 cm irregular thin walled cavitary lesion in the right upper lobe, previously 2.6 x 2.5 cm in 2018.   Navigation bronchoscopy revealed adenocarcinoma.    Biopsies of Right Upper Lobe 02/25/2018   Tobacco/Marijuana/Snuff/ETOH use: Former  Past/Anticipated interventions by cardiothoracic surgery, if any:  Dr. Servando Snare 03/26/2018 - Recent navigation bronchoscopy revealed adenocarcinoma. -We discussed the treatment options for stage I lung cancer including resection, which in this case would require lobectomy versus stereotactic radiotherapy. -Patient had clearance by cardiology over the telephone , after talking to the patient examining and reviewing his history I suspect his cardiac risk is higher than estimated by cardiology .  The patient notes he had no symptoms at the time of his coronary stents placed in 2017. -I further discussed with the patient risk of surgical resection versus consideration for stereotactic radiotherapy.  At this point will refer him to radiation oncology to consider stereotactic radiotherapy to the right upper lobe, then with continued aggressive treatment of his underlying pulmonary disease, avoiding smoking, and serial CT scans post radiation. -Not surgical candidate due to heart issues.  Past/Anticipated interventions by medical oncology, if  any:   Signs/Symptoms  Weight changes, if any: 17 pounds down over the last 2 weeks.  Appetite is poor.  Respiratory complaints, if any: Has SOB with increased activities.  Hemoptysis, if any: Productive cough, clear phlegm, no blood noted.  Pain issues, if any:  Back, no chest pains.  BP (!) 158/76 (BP Location: Left Arm, Patient Position: Sitting)   Pulse 66   Temp 98.4 F (36.9 C) (Oral)   Resp 18   Ht 5\' 9"  (1.753 m)   Wt 217 lb 6.4 oz (98.6 kg)   SpO2 98%   BMI 32.10 kg/m    Wt Readings from Last 3 Encounters:  04/02/18 217 lb 6.4 oz (98.6 kg)  04/01/18 215 lb (97.5 kg)  03/26/18 217 lb (98.4 kg)   SAFETY ISSUES:  Prior radiation? No  Pacemaker/ICD? No  Possible current pregnancy? No  Is the patient on methotrexate? No  Current Complaints / other details:   Allergy to contrast dye.

## 2018-04-02 ENCOUNTER — Ambulatory Visit
Admission: RE | Admit: 2018-04-02 | Discharge: 2018-04-02 | Disposition: A | Discharge status: Home / Self Care | Payer: Medicare Other | Source: Ambulatory Visit | Attending: Radiation Oncology | Admitting: Radiation Oncology

## 2018-04-02 ENCOUNTER — Telehealth: Payer: Self-pay | Admitting: Internal Medicine

## 2018-04-02 ENCOUNTER — Other Ambulatory Visit: Payer: Self-pay

## 2018-04-02 ENCOUNTER — Encounter: Payer: Self-pay | Admitting: Radiation Oncology

## 2018-04-02 ENCOUNTER — Other Ambulatory Visit: Payer: Self-pay | Admitting: Internal Medicine

## 2018-04-02 VITALS — BP 158/76 | HR 66 | Temp 98.4°F | Resp 18 | Ht 69.0 in | Wt 217.4 lb

## 2018-04-02 DIAGNOSIS — J449 Chronic obstructive pulmonary disease, unspecified: Secondary | ICD-10-CM | POA: Insufficient documentation

## 2018-04-02 DIAGNOSIS — Z510 Encounter for antineoplastic radiation therapy: Secondary | ICD-10-CM | POA: Insufficient documentation

## 2018-04-02 DIAGNOSIS — E119 Type 2 diabetes mellitus without complications: Secondary | ICD-10-CM | POA: Insufficient documentation

## 2018-04-02 DIAGNOSIS — K219 Gastro-esophageal reflux disease without esophagitis: Secondary | ICD-10-CM | POA: Diagnosis not present

## 2018-04-02 DIAGNOSIS — G8929 Other chronic pain: Secondary | ICD-10-CM | POA: Insufficient documentation

## 2018-04-02 DIAGNOSIS — C3411 Malignant neoplasm of upper lobe, right bronchus or lung: Secondary | ICD-10-CM

## 2018-04-02 DIAGNOSIS — Z87891 Personal history of nicotine dependence: Secondary | ICD-10-CM | POA: Insufficient documentation

## 2018-04-02 DIAGNOSIS — E785 Hyperlipidemia, unspecified: Secondary | ICD-10-CM | POA: Diagnosis not present

## 2018-04-02 DIAGNOSIS — Z79899 Other long term (current) drug therapy: Secondary | ICD-10-CM | POA: Diagnosis not present

## 2018-04-02 DIAGNOSIS — I10 Essential (primary) hypertension: Secondary | ICD-10-CM | POA: Diagnosis not present

## 2018-04-02 DIAGNOSIS — K5730 Diverticulosis of large intestine without perforation or abscess without bleeding: Secondary | ICD-10-CM | POA: Diagnosis not present

## 2018-04-02 DIAGNOSIS — Z7982 Long term (current) use of aspirin: Secondary | ICD-10-CM | POA: Insufficient documentation

## 2018-04-02 DIAGNOSIS — M549 Dorsalgia, unspecified: Secondary | ICD-10-CM | POA: Insufficient documentation

## 2018-04-02 DIAGNOSIS — M129 Arthropathy, unspecified: Secondary | ICD-10-CM | POA: Diagnosis not present

## 2018-04-02 DIAGNOSIS — Z800 Family history of malignant neoplasm of digestive organs: Secondary | ICD-10-CM | POA: Diagnosis not present

## 2018-04-02 DIAGNOSIS — I2510 Atherosclerotic heart disease of native coronary artery without angina pectoris: Secondary | ICD-10-CM | POA: Insufficient documentation

## 2018-04-02 DIAGNOSIS — Z806 Family history of leukemia: Secondary | ICD-10-CM | POA: Insufficient documentation

## 2018-04-02 DIAGNOSIS — R109 Unspecified abdominal pain: Secondary | ICD-10-CM | POA: Insufficient documentation

## 2018-04-02 MED ORDER — TRAMADOL HCL 50 MG PO TABS: 50.0000 mg | ORAL_TABLET | Freq: Three times a day (TID) | ORAL | 0 refills | Status: DC | PRN

## 2018-04-02 NOTE — Telephone Encounter (Signed)
Pt returned call to get his lab results.

## 2018-04-02 NOTE — Progress Notes (Signed)
Radiation Oncology         [phone]) [phone] ________________________________  Name: Joshua [name]        MRN: [id number]  Date of Service: 04/02/2018 DOB: 1956/03/16  NF:AOZHY, Coralie Keens, NP  Grace Isaac, MD     REFERRING PHYSICIAN: Grace Isaac, MD   DIAGNOSIS: The encounter diagnosis was Malignant neoplasm of upper lobe of right lung [facility]).   HISTORY OF PRESENT ILLNESS: Joshua [name] is a 63 y.o. male seen at the request of Dr. Servando Snare for a newly diagnosed early stage lung cancer. The patient has multiple comorbidities but has been followed by imaging for a cystic change in the RUL in the past. Apparently this was not present in 2018, but he was sent to the lung cancer screening clinic for CT imaging and on 01/12/18 this revealed a 2.3 x 2.7 cm mass in the RUL that was new since 2014, but had increased in size since a prior scan in 2018. He underwent PET imaging on 02/09/18 that did not reveal hypermetabolic changes. He then was counseled on bronchoscopy with EBUS and underwent this procedure on 02/25/18. Brushings from the RUL as well as a biopsy revealed NSCLC. The biopsy identified adenocarcinoma as the phenotype. He met with Dr. Servando Snare but given prior coronary vessel disease, he was not felt to be a good candidate for lobectomy, and he comes today to discuss options of stereotactic body radiotherapy (SBRT) as an alternative. Of note he is also being worked up for abdominal pain and reports he had a CT scan yesterday. This was a CT abdomen and pelvis without contrast. It did not reveal any acute findings. He is trying to get in with his GI physician to see if this can be further investigated.   PREVIOUS RADIATION THERAPY: No   PAST MEDICAL HISTORY:  Past Medical History:  Diagnosis Date  . AC (acromioclavicular) joint bone spurs, unspecified laterality 11/25/2016  . Arthritis    "back" (05/16/2015)  . CAD in native artery, occluded Lcx and rotational atherectomy  to LAD with DES 05/17/15 05/18/2015  . Cavitary lesion of lung 02/25/2018  . Chronic back pain    "all my back"  . Chronic bronchitis ([place])   . COPD (chronic obstructive pulmonary disease) ([place])   . Diabetes mellitus without complication ([place])   . GERD (gastroesophageal reflux disease)   . H/O blood clots    "had them in my back; put filter in before one of my neck ORs"  . History of gout   . Hyperlipidemia   . Hypertension   . Neuromuscular disorder ([place])   . PAD (peripheral artery disease) ([place])   . Phlebitis   . Positive TB test   . Seasonal allergies   . Shortness of breath   . Spinal disease        PAST SURGICAL HISTORY: Past Surgical History:  Procedure Laterality Date  . ABDOMINAL AORTOGRAM W/LOWER EXTREMITY  01/07/2018  . ABDOMINAL AORTOGRAM W/LOWER EXTREMITY N/A 01/07/2018   Procedure: ABDOMINAL AORTOGRAM W/LOWER EXTREMITY;  Surgeon: Wellington Hampshire, MD;  Location: [place] [facility];  Service: Cardiovascular;  Laterality: N/A;  . ANTERIOR CERVICAL DECOMP/DISCECTOMY FUSION  ~ 2001-2009 X 3  . ANTERIOR CERVICAL DECOMP/DISCECTOMY FUSION N/A 10/06/2012   Procedure: ANTERIOR CERVICAL DECOMPRESSION/DISCECTOMY FUSION 1 LEVEL;  Surgeon: [name], MD;  Location: [facility];  Service: Neurosurgery;  Laterality: N/A;  C7T1 anterior cervical decompression with fusion plating and bonegraft   . BACK SURGERY    .  CARDIAC CATHETERIZATION  05/16/2015  . CARDIAC CATHETERIZATION N/A 05/16/2015   Procedure: Left Heart Cath and Coronary Angiography;  Surgeon: [name], MD;  Location: [facility];  Service: Cardiovascular;  Laterality: N/A;  . CARDIAC CATHETERIZATION N/A 05/16/2015   Procedure: Coronary Balloon Angioplasty;  Surgeon: [name], MD;  Location: [place] [facility];  Service: Cardiovascular;  Laterality: N/A;  . CARDIAC CATHETERIZATION N/A 05/17/2015   Procedure: Coronary Stent Intervention Rotoblater;  Surgeon: [name], MD;  Location: [facility];  Service: Cardiovascular;  Laterality: N/A;  . ELBOW SURGERY Left 2016   "had bone pieces removed"  . ESOPHAGEAL MANOMETRY N/A 12/30/2016   Procedure: ESOPHAGEAL MANOMETRY (EM);  Surgeon: Irene Shipper, MD;  Location: [facility];  Service: Endoscopy;  Laterality: N/A;  . EXCISIONAL HEMORRHOIDECTOMY    . FEMORAL BYPASS  ~ 2010  . IR RADIOLOGIST EVAL & MGMT  12/17/2016  . IVC FILTER PLACEMENT ([place] HX)  2010   "had blood clots in my back"  . NECK SURGERY  2014   2008-2014  . PERIPHERAL VASCULAR ATHERECTOMY  01/07/2018   Procedure: PERIPHERAL VASCULAR ATHERECTOMY;  Surgeon: Wellington Hampshire, MD;  Location: [facility];  Service: Cardiovascular;;  . PERIPHERAL VASCULAR BALLOON ANGIOPLASTY  01/07/2018   Procedure: PERIPHERAL VASCULAR BALLOON ANGIOPLASTY;  Surgeon: Wellington Hampshire, MD;  Location: [facility];  Service: Cardiovascular;;  . PERIPHERAL VASCULAR CATHETERIZATION N/A 02/14/2016   Procedure: Abdominal Aortogram w/Lower Extremity;  Surgeon: Wellington Hampshire, MD;  Location: [facility];  Service: Cardiovascular;  Laterality: N/A;  . POSTERIOR LAMINECTOMY / DECOMPRESSION LUMBAR SPINE  2006   "bone spurs"  . VIDEO BRONCHOSCOPY WITH ENDOBRONCHIAL NAVIGATION Right 02/25/2018   Procedure: VIDEO BRONCHOSCOPY WITH ENDOBRONCHIAL NAVIGATION;  Surgeon: Garner Nash, DO;  Location: [facility];  Service: Thoracic;  Laterality: Right;  [name] VIDEO BRONCHOSCOPY WITH ENDOBRONCHIAL ULTRASOUND Right 02/25/2018   Procedure: VIDEO BRONCHOSCOPY WITH ENDOBRONCHIAL ULTRASOUND;  Surgeon: Garner Nash, DO;  Location: [facility];  Service: Thoracic;  Laterality: Right;     FAMILY HISTORY:  Family History  Problem Relation Age of Onset  . Stomach cancer Mother        Deceased, 69s  . Hypertension Mother   . Hypertension Father   . Stroke Father   . Heart attack Father        Deceased, 81  . Aneurysm Brother   . Healthy Daughter   . Healthy Maternal Grandmother   . Leukemia  Grandchild   . Diabetes Neg Hx   . Early death Neg Hx   . Colon cancer Neg Hx   . Pancreatic cancer Neg Hx      SOCIAL HISTORY:  reports that he quit smoking about 3 weeks ago. His smoking use included cigarettes. He has a 36.75 pack-year smoking history. He has never used smokeless tobacco. He reports current alcohol use. He reports that he does not use drugs.   ALLERGIES: Lisinopril; Amitriptyline; Contrast media [iodinated diagnostic agents]; and Ibuprofen   MEDICATIONS:  Current Outpatient Medications  Medication Sig Dispense Refill  . albuterol (PROAIR HFA) 108 (90 Base) MCG/ACT inhaler USE 2 PUFFS EVERY 6 HOURS&nbsp;&nbsp;AS NEEDED FOR WHEEZING OR&nbsp;&nbsp;SHORTNESS OF BREATH 34 g 3  . albuterol (PROVENTIL) (2.5 MG/3ML) 0.083% nebulizer solution Take 3 mLs (2.5 mg total) by nebulization every 6 (six) hours as needed for wheezing or shortness of breath. 75 mL 5  . aspirin EC 81 MG tablet Take  81 mg by mouth daily.    [name] atorvastatin (LIPITOR) 40 MG tablet Take 1 tablet (40 mg total) by mouth daily at 6 PM. 90 tablet 1  . Blood Glucose Monitoring Suppl (ONETOUCH VERIO) w/Device KIT 1 Device by Does not apply route once. 1 kit 0  . clopidogrel (PLAVIX) 75 MG tablet Take 1 tablet (75 mg total) by mouth daily. 90 tablet 3  . fexofenadine (ALLEGRA ALLERGY) 180 MG tablet Take 1 tablet (180 mg total) by mouth daily. 90 tablet 3  . Fluticasone-Umeclidin-Vilant (TRELEGY ELLIPTA) 100-62.5-25 MCG/INH AEPB Inhale 1 puff into the lungs daily. 28 each 5  . Fluticasone-Umeclidin-Vilant (TRELEGY ELLIPTA) 100-62.5-25 MCG/INH AEPB Inhale 1 puff into the lungs daily. 2 each 5  . nitroGLYCERIN (NITROSTAT) 0.4 MG SL tablet Place 1 tablet (0.4 mg total) under the tongue every 5 (five) minutes as needed for chest pain. 25 tablet 4  . Omega-3 Fatty Acids (FISH OIL) 1000 MG CAPS Take 1,000 mg by mouth daily.     [name] omeprazole (PRILOSEC) 40 MG capsule Take 1 capsule (40 mg total) by mouth daily. PATIENT NEEDS  OFFICE VISIT FOR FURTHER REFILLS 90 capsule 0  . ONE TOUCH LANCETS MISC 1 each by Does not apply route 3 (three) times daily. 200 each 5  . ONETOUCH VERIO test strip USE TO TEST 3 TIMES DAILY 100 each 1  . OVER THE COUNTER MEDICATION Apply 1 application topically daily as needed (neuropathy pain). Hemp oil cream    . traMADol (ULTRAM) 50 MG tablet Take 1 tablet (50 mg total) by mouth every 8 (eight) hours as needed. 15 tablet 0  . traZODone (DESYREL) 100 MG tablet TAKE 1 TABLET BY MOUTH AT  BEDTIME 90 tablet 1  . amLODipine (NORVASC) 5 MG tablet Take 1 tablet (5 mg total) by mouth daily. 90 tablet 1  . diphenhydrAMINE (BENADRYL) 50 MG tablet Take 0.5 tablets (25 mg total) by mouth once. 1 hr prior to CT scan 30 tablet 0   No current facility-administered medications for this encounter.      REVIEW OF SYSTEMS: On review of systems, the patient reports that he is doing well overall. He reports some mild coughing and productive mucous. He denies any hemoptysis and denies any weight loss but has gained 17 pounds in the last few months. He denies any chest pain, shortness of breath, fevers, chills, night sweats.  He denies any bowel or bladder disturbances, and denies abdominal pain, nausea or vomiting. He denies any new musculoskeletal or joint aches or pains. A complete review of systems is obtained and is otherwise negative.     PHYSICAL EXAM:  Wt Readings from Last 3 Encounters:  04/02/18 217 lb 6.4 oz (98.6 kg)  04/01/18 215 lb (97.5 kg)  03/26/18 217 lb (98.4 kg)   Temp Readings from Last 3 Encounters:  04/02/18 98.4 F (36.9 C) (Oral)  04/01/18 98.2 F (36.8 C) (Oral)  02/25/18 (!) 97.5 F (36.4 C)   BP Readings from Last 3 Encounters:  04/02/18 (!) 158/76  04/01/18 (!) 156/98  03/26/18 (!) 171/104   Pulse Readings from Last 3 Encounters:  04/02/18 66  04/01/18 76  03/26/18 76   Pain Assessment Pain Score: 2 /10  In general this is a chronically ill appearing caucasian  male in no acute distress. He is alert and oriented x4 and appropriate throughout the examination. HEENT reveals that the patient is normocephalic, atraumatic. EOMs are intact.  Skin is flushed and with multiple excoriations. it  is otherwise intact without any evidence of gross lesions. Cardiovascular exam reveals a regular rate and rhythm, no clicks rubs or murmurs are auscultated. Chest is clear to auscultation bilaterally. Lymphatic assessment is performed and does not reveal any adenopathy in the cervical, supraclavicular, axillary, or inguinal chains. Abdomen has active bowel sounds in all quadrants and is intact. The abdomen is soft, non tender, non distended. Lower extremities are negative for pretibial pitting edema, deep calf tenderness, cyanosis or clubbing.   ECOG = 1  0 - Asymptomatic (Fully active, able to carry on all predisease activities without restriction)  1 - Symptomatic but completely ambulatory (Restricted in physically strenuous activity but ambulatory and able to carry out work of a light or sedentary nature. For example, light housework, office work)  2 - Symptomatic, <50% in bed during the day (Ambulatory and capable of all self care but unable to carry out any work activities. Up and about more than 50% of waking hours)  3 - Symptomatic, >50% in bed, but not bedbound (Capable of only limited self-care, confined to bed or chair 50% or more of waking hours)  4 - Bedbound (Completely disabled. Cannot carry on any self-care. Totally confined to bed or chair)  5 - Death   Eustace Pen MM, Creech RH, Tormey DC, et al. [id number]). "Toxicity and response criteria of the [facility]". [place] Oncol. 5 (6): 649-55    LABORATORY DATA:  Lab Results  Component Value Date   WBC 11.7 (H) 04/01/2018   HGB 17.0 04/01/2018   HCT 49.8 04/01/2018   MCV 93.8 04/01/2018   PLT 227.0 04/01/2018   Lab Results  Component Value Date   NA 137 04/01/2018   K 3.7  04/01/2018   CL 101 04/01/2018   CO2 29 04/01/2018   Lab Results  Component Value Date   ALT 24 04/01/2018   AST 21 04/01/2018   ALKPHOS 75 04/01/2018   BILITOT 0.2 04/01/2018      RADIOGRAPHY: Ct Abdomen Pelvis Wo Contrast  Result Date: 04/01/2018 CLINICAL DATA:  Nausea with abdominal bloating, right lower quadrant abdominal pain and diarrhea since yesterday. Concern for appendicitis. EXAM: CT ABDOMEN AND PELVIS WITHOUT CONTRAST TECHNIQUE: Multidetector CT imaging of the abdomen and pelvis was performed following the standard protocol without IV contrast. COMPARISON:  Abdominopelvic CT 12/05/2016.  PET-CT 02/09/2018. FINDINGS: Lower chest: New subsegmental atelectasis at the anterior left lung base. No confluent airspace opacity, significant pleural or pericardial effusion. Diffuse coronary artery and aortic atherosclerosis. Hepatobiliary: Borderline hepatic steatosis without focal abnormality on noncontrast imaging. The gallbladder is incompletely distended. No evidence of gallbladder wall thickening or cholelithiasis. Mild extrahepatic biliary dilatation with the common hepatic duct measuring 11 mm on coronal image 48/4, similar to previous study. No evidence of calcified common duct stone. Pancreas: No evidence of pancreatic mass, pancreatic ductal dilatation or surrounding inflammation. 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 Small air bubbles surrounding the distal common bile duct are similar to priors, probably due to small duodenal diverticula. Spleen: Normal in size without focal abnormality. Adrenals/Urinary Tract: Both adrenal glands appear normal. Renovascular calcifications bilaterally without definite urinary tract calculi. No hydronephrosis or perinephric soft tissue stranding. The bladder appears normal. Stomach/Bowel: No evidence of bowel wall thickening, distention or surrounding inflammatory change. The appendix appears normal. There are diverticular changes  throughout the sigmoid colon. Vascular/Lymphatic: There are no enlarged abdominal or pelvic lymph nodes. Stable small lymph nodes in the porta hepatis. There is diffuse  aortic and branch vessel atherosclerosis status post aortobifemoral grafting. 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 IVC filter appears unchanged. Reproductive: The prostate gland and seminal vesicles appear stable without suspicious findings. Other: No ascites or free air. Stable asymmetric fat in the left inguinal canal. Musculoskeletal: No acute or significant osseous findings. Postsurgical changes are present in the lower lumbar spine. IMPRESSION: 1. No acute findings or explanation for the patient's symptoms. No evidence of appendicitis. 2. Stable mild extrahepatic biliary dilatation of undetermined etiology or significance. Given stability and lack of significant LFT elevation, this is likely physiologic. 3. New mild subsegmental atelectasis at the left lung base. 4. Sigmoid diverticulosis. 5. Coronary and aortic Atherosclerosis (ICD10-I70.0). Previous aortobifemoral grafting. Electronically Signed   By: Richardean Sale M.D.   On: 04/01/2018 19:00       IMPRESSION/PLAN: 1. Stage IA3, cT1cN0M0 NSCLC, adenocarcinoma of the RUL. Dr. Lisbeth Renshaw discusses the pathology findings and reviews the nature of early stage lung cancers. As he is not a candidate for surgical resection, Dr. Lisbeth Renshaw outlines the alternative to consider SBRT. We discussed the risks, benefits, short, and long term effects of radiotherapy, and the patient is interested in proceeding. Dr. Lisbeth Renshaw discusses the delivery and logistics of radiotherapy and anticipates a course of 3 fractions of radiotherapy. He will simulate today. Written consent is obtained and placed in the chart, a copy was provided to the patient. 2. Abdominal pain. The patient was encouraged to follow up with his PCP for further evaluation. He is  in agreement wit this plan and we will follow this expectantly.   The above documentation reflects my direct findings during this shared patient visit. Please see the separate note by Dr. Lisbeth Renshaw on this date for the remainder of the patient's plan of care.    Carola Rhine, PAC

## 2018-04-03 ENCOUNTER — Encounter: Payer: Self-pay | Admitting: General Practice

## 2018-04-03 DIAGNOSIS — C3411 Malignant neoplasm of upper lobe, right bronchus or lung: Secondary | ICD-10-CM | POA: Diagnosis not present

## 2018-04-03 DIAGNOSIS — Z510 Encounter for antineoplastic radiation therapy: Secondary | ICD-10-CM | POA: Diagnosis not present

## 2018-04-03 NOTE — Progress Notes (Signed)
[facility] Psychosocial Distress Screening Clinical Social Work  Clinical Social Work was referred by distress screening protocol.  The patient scored a 6 on the Psychosocial Distress Thermometer which indicates moderate distress. Clinical Social Worker contacted patient by phone to assess for distress and other psychosocial needs. Unable to reach by phone, left generic VM outlining [facility] resources in general terms, provided contact information and encouraged him to reach out as needed.    ON[facility] DISTRESS SCREENING 04/02/2018  Screening Type Initial Screening  Distress experienced in past week (1-10) 6  Emotional problem type Adjusting to illness  Information Concerns Type Lack of info about treatment  Physical Problem type Tingling hands/feet  Other Reached by phone: [phone]    Clinical Social Worker follow up needed: No.  If yes, follow up plan:  Beverely Pace, [place], LCSW Clinical Social Worker Phone:  [phone]

## 2018-04-03 NOTE — Telephone Encounter (Signed)
Left message on voicemail.

## 2018-04-07 ENCOUNTER — Other Ambulatory Visit: Payer: Self-pay | Admitting: Cardiovascular Disease

## 2018-04-10 ENCOUNTER — Ambulatory Visit: Payer: Medicare Other | Admitting: Radiation Oncology

## 2018-04-13 ENCOUNTER — Ambulatory Visit: Payer: Medicare Other | Admitting: Radiation Oncology

## 2018-04-14 ENCOUNTER — Ambulatory Visit
Admission: RE | Admit: 2018-04-14 | Discharge: 2018-04-14 | Disposition: A | Discharge status: Home / Self Care | Payer: Medicare Other | Source: Ambulatory Visit | Attending: Radiation Oncology | Admitting: Radiation Oncology

## 2018-04-14 DIAGNOSIS — C3411 Malignant neoplasm of upper lobe, right bronchus or lung: Secondary | ICD-10-CM | POA: Diagnosis not present

## 2018-04-14 DIAGNOSIS — Z510 Encounter for antineoplastic radiation therapy: Secondary | ICD-10-CM | POA: Diagnosis not present

## 2018-04-16 ENCOUNTER — Ambulatory Visit
Admission: RE | Admit: 2018-04-16 | Discharge: 2018-04-16 | Disposition: A | Discharge status: Home / Self Care | Payer: Medicare Other | Source: Ambulatory Visit | Attending: Radiation Oncology | Admitting: Radiation Oncology

## 2018-04-16 DIAGNOSIS — Z510 Encounter for antineoplastic radiation therapy: Secondary | ICD-10-CM | POA: Diagnosis not present

## 2018-04-16 DIAGNOSIS — C3411 Malignant neoplasm of upper lobe, right bronchus or lung: Secondary | ICD-10-CM | POA: Diagnosis not present

## 2018-04-17 ENCOUNTER — Ambulatory Visit: Payer: Medicare Other | Admitting: Radiation Oncology

## 2018-04-20 ENCOUNTER — Ambulatory Visit: Payer: Medicare Other | Admitting: Radiation Oncology

## 2018-04-21 ENCOUNTER — Encounter: Payer: Self-pay | Admitting: Radiation Oncology

## 2018-04-21 ENCOUNTER — Ambulatory Visit
Admission: RE | Admit: 2018-04-21 | Discharge: 2018-04-21 | Disposition: A | Discharge status: Home / Self Care | Payer: Medicare Other | Source: Ambulatory Visit | Attending: Radiation Oncology | Admitting: Radiation Oncology

## 2018-04-21 DIAGNOSIS — Z510 Encounter for antineoplastic radiation therapy: Secondary | ICD-10-CM | POA: Diagnosis not present

## 2018-04-21 DIAGNOSIS — C3411 Malignant neoplasm of upper lobe, right bronchus or lung: Secondary | ICD-10-CM | POA: Insufficient documentation

## 2018-04-21 NOTE — Progress Notes (Signed)
[facility] Radiation Oncology Simulation and Treatment Planning Note   Name:  Joshua [name] MRN: [id number]   Date: 04/02/2018  DOB: [birth year]  Status:outpatient    DIAGNOSIS:    ICD-10-CM   1. Malignant neoplasm of upper lobe of right lung ([place]) C34.11      CONSENT VERIFIED:yes   SET UP: Patient is setup supine   IMMOBILIZATION: The patient was immobilized using a customized Vac Loc bag/ blue bag and customized accuform device   NARRATIVE:The patient was brought to the [facility].  Identity was confirmed.  All relevant records and images related to the planned course of therapy were reviewed.  Then, the patient was positioned in a stable reproducible clinical set-up for radiation therapy. Abdominal compression was applied by me.  4D CT images were obtained and reproducible breathing pattern was confirmed. Free breathing CT images were obtained.  Skin markings were placed.  The CT images were loaded into the planning software where the target and avoidance structures were contoured.  The radiation prescription was entered and confirmed.    TREATMENT PLANNING NOTE:  Treatment planning then occurred. I have requested : IMRT planning.This treatment technique is medically necessary due to the high-dose of radiation delivered to the target region which is in close proximity to adjacent critical normal structures.  3 dimensional simulation is performed and dose volume histogram of the gross tumor volume, planning tumor volume and criticial normal structures including the spinal cord and lungs were analyzed and requested.  Special treatment procedure was performed due to high dose per fraction.  The patient will be monitored for increased risk of toxicity.  Daily imaging using cone beam CT will be used for target localization.  I anticipate that the patient will receive 54 Gy in 3 fractions to target volume. Further adjustments will be made based  on the planning process is necessary.  ------------------------------------------------  Jodelle Gross, MD, PhD

## 2018-04-28 ENCOUNTER — Ambulatory Visit: Payer: Medicare Other | Admitting: Pulmonary Disease

## 2018-05-02 DIAGNOSIS — J449 Chronic obstructive pulmonary disease, unspecified: Secondary | ICD-10-CM | POA: Diagnosis not present

## 2018-05-04 ENCOUNTER — Other Ambulatory Visit: Payer: Self-pay | Admitting: Internal Medicine

## 2018-05-04 DIAGNOSIS — K253 Acute gastric ulcer without hemorrhage or perforation: Secondary | ICD-10-CM

## 2018-05-04 DIAGNOSIS — K269 Duodenal ulcer, unspecified as acute or chronic, without hemorrhage or perforation: Secondary | ICD-10-CM

## 2018-05-15 ENCOUNTER — Other Ambulatory Visit: Payer: Self-pay | Admitting: Internal Medicine

## 2018-05-15 DIAGNOSIS — K269 Duodenal ulcer, unspecified as acute or chronic, without hemorrhage or perforation: Secondary | ICD-10-CM

## 2018-05-15 DIAGNOSIS — K253 Acute gastric ulcer without hemorrhage or perforation: Secondary | ICD-10-CM

## 2018-05-20 ENCOUNTER — Telehealth: Payer: Self-pay | Admitting: Internal Medicine

## 2018-05-20 DIAGNOSIS — K269 Duodenal ulcer, unspecified as acute or chronic, without hemorrhage or perforation: Secondary | ICD-10-CM

## 2018-05-20 DIAGNOSIS — K253 Acute gastric ulcer without hemorrhage or perforation: Secondary | ICD-10-CM

## 2018-05-20 MED ORDER — OMEPRAZOLE 40 MG PO CPDR: 40.0000 mg | DELAYED_RELEASE_CAPSULE | Freq: Every day | ORAL | 0 refills | Status: DC

## 2018-05-20 NOTE — Telephone Encounter (Signed)
Refilled Omeprazole 

## 2018-05-31 DIAGNOSIS — J449 Chronic obstructive pulmonary disease, unspecified: Secondary | ICD-10-CM | POA: Diagnosis not present

## 2018-06-01 ENCOUNTER — Telehealth: Payer: Self-pay | Admitting: Radiation Oncology

## 2018-06-01 DIAGNOSIS — C3411 Malignant neoplasm of upper lobe, right bronchus or lung: Secondary | ICD-10-CM

## 2018-06-01 NOTE — Telephone Encounter (Signed)
I spoke with the patient's wife to let her know we've been advised by the cancer center medical director to cancel nonurgent medical appointments due to coronavirus pandemic. The patient is 6 weeks out from SBRT to the lung for stage I lung cancer. He is due for posttreatment CT imaging. I've placed an order for noncontrasted CT due to his contrast allergy to be performed within the next month. The paitent's wife was in agreement with this and will share this with him when he returns home. She reports he's feeling well without symptoms of shortness of breath or fever. We did review the rare but possible chance of radiation pneumonitis at th 6-8 week window following XRT to the chest and reviewed that the symptoms are similar to coronavirus. If he has symptoms, he should be appropriately triaged, and we would want to be a part of his care at that time as we would give abx therapy as well as 5 1/2 weeks of prednisone.

## 2018-06-02 ENCOUNTER — Ambulatory Visit: Payer: Self-pay | Admitting: Radiation Oncology

## 2018-06-02 NOTE — Progress Notes (Signed)
  Radiation Oncology         [phone]) [phone] ________________________________  Name: Joshua [name] MRN: [id number]  Date: 04/21/2018  DOB: 1955-08-21  End of Treatment Note  Diagnosis:   62 y.o. male with Stage IA3, cT1cN0M0 NSCLC, adenocarcinoma of the RUL  Indication for treatment:  Curative       Radiation treatment dates:   04/14/2018, 04/16/2018, 04/21/2018  Site/dose:   The tumor in the RUL was treated with a course of stereotactic body radiation treatment. The patient received 54 Gy in 3 fractions at 18 Gy per fraction.  Beams/energy:   SBRT/SRT-VMAT // 6X-FFF Photon  Narrative: The patient tolerated radiation treatment relatively well.   The patient did not have any signs of acute toxicity during treatment.  Plan: The patient has completed radiation treatment. The patient will return to radiation oncology clinic for routine followup in one month. I advised the patient to call or return sooner if they have any questions or concerns related to their recovery or treatment.   ------------------------------------------------  Jodelle Gross, MD, PhD  This document serves as a record of services personally performed by Kyung Rudd, MD. It was created on his behalf by Rae Lips, a trained medical scribe. The creation of this record is based on the scribe's personal observations and the provider's statements to them. This document has been checked and approved by the attending provider.

## 2018-06-04 ENCOUNTER — Other Ambulatory Visit: Payer: Self-pay | Admitting: Internal Medicine

## 2018-06-10 ENCOUNTER — Ambulatory Visit: Payer: Medicare Other | Admitting: Internal Medicine

## 2018-06-30 ENCOUNTER — Other Ambulatory Visit: Payer: Self-pay | Admitting: Internal Medicine

## 2018-07-01 ENCOUNTER — Other Ambulatory Visit: Payer: Self-pay

## 2018-07-01 ENCOUNTER — Ambulatory Visit
Admission: RE | Admit: 2018-07-01 | Discharge: 2018-07-01 | Disposition: A | Discharge status: Home / Self Care | Payer: Medicare Other | Source: Ambulatory Visit | Attending: Radiation Oncology | Admitting: Radiation Oncology

## 2018-07-01 DIAGNOSIS — C3411 Malignant neoplasm of upper lobe, right bronchus or lung: Secondary | ICD-10-CM

## 2018-07-01 DIAGNOSIS — J449 Chronic obstructive pulmonary disease, unspecified: Secondary | ICD-10-CM | POA: Diagnosis not present

## 2018-07-02 ENCOUNTER — Telehealth: Payer: Self-pay | Admitting: Radiation Oncology

## 2018-07-02 DIAGNOSIS — C3411 Malignant neoplasm of upper lobe, right bronchus or lung: Secondary | ICD-10-CM

## 2018-07-02 NOTE — Telephone Encounter (Signed)
I spoke with the patient regarding his CT results and the plans to repeat his scan in 6 months. He is in agreement, orders were placed.

## 2018-07-09 ENCOUNTER — Emergency Department (HOSPITAL_COMMUNITY): Payer: Medicare Other

## 2018-07-09 ENCOUNTER — Encounter: Payer: Self-pay | Admitting: Internal Medicine

## 2018-07-09 ENCOUNTER — Ambulatory Visit: Payer: Medicare Other | Admitting: Pulmonary Disease

## 2018-07-09 ENCOUNTER — Other Ambulatory Visit (INDEPENDENT_AMBULATORY_CARE_PROVIDER_SITE_OTHER): Payer: Medicare Other

## 2018-07-09 ENCOUNTER — Other Ambulatory Visit: Payer: Self-pay

## 2018-07-09 ENCOUNTER — Ambulatory Visit (INDEPENDENT_AMBULATORY_CARE_PROVIDER_SITE_OTHER): Payer: Medicare Other | Admitting: Internal Medicine

## 2018-07-09 ENCOUNTER — Emergency Department (HOSPITAL_COMMUNITY)
Admission: EM | Admit: 2018-07-09 | Discharge: 2018-07-09 | Disposition: A | Discharge status: Home / Self Care | Payer: Medicare Other | Attending: Emergency Medicine | Admitting: Emergency Medicine

## 2018-07-09 ENCOUNTER — Encounter (HOSPITAL_COMMUNITY): Payer: Self-pay | Admitting: Emergency Medicine

## 2018-07-09 VITALS — BP 102/87

## 2018-07-09 DIAGNOSIS — K219 Gastro-esophageal reflux disease without esophagitis: Secondary | ICD-10-CM

## 2018-07-09 DIAGNOSIS — J449 Chronic obstructive pulmonary disease, unspecified: Secondary | ICD-10-CM

## 2018-07-09 DIAGNOSIS — Z87891 Personal history of nicotine dependence: Secondary | ICD-10-CM | POA: Insufficient documentation

## 2018-07-09 DIAGNOSIS — M545 Low back pain: Secondary | ICD-10-CM | POA: Diagnosis present

## 2018-07-09 DIAGNOSIS — I739 Peripheral vascular disease, unspecified: Secondary | ICD-10-CM

## 2018-07-09 DIAGNOSIS — I2510 Atherosclerotic heart disease of native coronary artery without angina pectoris: Secondary | ICD-10-CM

## 2018-07-09 DIAGNOSIS — M1A00X0 Idiopathic chronic gout, unspecified site, without tophus (tophi): Secondary | ICD-10-CM

## 2018-07-09 DIAGNOSIS — F5101 Primary insomnia: Secondary | ICD-10-CM

## 2018-07-09 DIAGNOSIS — E782 Mixed hyperlipidemia: Secondary | ICD-10-CM

## 2018-07-09 DIAGNOSIS — C3411 Malignant neoplasm of upper lobe, right bronchus or lung: Secondary | ICD-10-CM

## 2018-07-09 DIAGNOSIS — M1990 Unspecified osteoarthritis, unspecified site: Secondary | ICD-10-CM | POA: Diagnosis not present

## 2018-07-09 DIAGNOSIS — E1151 Type 2 diabetes mellitus with diabetic peripheral angiopathy without gangrene: Secondary | ICD-10-CM | POA: Diagnosis not present

## 2018-07-09 DIAGNOSIS — M5441 Lumbago with sciatica, right side: Secondary | ICD-10-CM

## 2018-07-09 DIAGNOSIS — I10 Essential (primary) hypertension: Secondary | ICD-10-CM | POA: Insufficient documentation

## 2018-07-09 DIAGNOSIS — E1142 Type 2 diabetes mellitus with diabetic polyneuropathy: Secondary | ICD-10-CM

## 2018-07-09 DIAGNOSIS — Z79899 Other long term (current) drug therapy: Secondary | ICD-10-CM | POA: Diagnosis not present

## 2018-07-09 DIAGNOSIS — E119 Type 2 diabetes mellitus without complications: Secondary | ICD-10-CM | POA: Insufficient documentation

## 2018-07-09 DIAGNOSIS — M5442 Lumbago with sciatica, left side: Secondary | ICD-10-CM | POA: Insufficient documentation

## 2018-07-09 DIAGNOSIS — Z7982 Long term (current) use of aspirin: Secondary | ICD-10-CM | POA: Diagnosis not present

## 2018-07-09 DIAGNOSIS — Z7902 Long term (current) use of antithrombotics/antiplatelets: Secondary | ICD-10-CM | POA: Diagnosis not present

## 2018-07-09 DIAGNOSIS — Z85118 Personal history of other malignant neoplasm of bronchus and lung: Secondary | ICD-10-CM | POA: Diagnosis not present

## 2018-07-09 DIAGNOSIS — M549 Dorsalgia, unspecified: Secondary | ICD-10-CM | POA: Diagnosis not present

## 2018-07-09 LAB — COMPREHENSIVE METABOLIC PANEL
ALT: 39 U/L (ref 0–53)
ALT: 44 U/L (ref 0–44)
AST: 38 U/L — ABNORMAL HIGH (ref 0–37)
AST: 51 U/L — ABNORMAL HIGH (ref 15–41)
Albumin: 3.8 g/dL (ref 3.5–5.2)
Albumin: 3.7 g/dL (ref 3.5–5.0)
Alkaline Phosphatase: 78 U/L (ref 39–117)
Alkaline Phosphatase: 80 U/L (ref 38–126)
Anion gap: 12 (ref 5–15)
BUN: 9 mg/dL (ref 6–23)
BUN: 10 mg/dL (ref 8–23)
CO2: 32 mEq/L (ref 19–32)
CO2: 21 mmol/L — ABNORMAL LOW (ref 22–32)
Calcium: 8.7 mg/dL (ref 8.4–10.5)
Calcium: 8.5 mg/dL — ABNORMAL LOW (ref 8.9–10.3)
Chloride: 101 mEq/L (ref 96–112)
Chloride: 106 mmol/L (ref 98–111)
Creatinine, Ser: 0.97 mg/dL (ref 0.40–1.50)
Creatinine, Ser: 0.97 mg/dL (ref 0.61–1.24)
GFR calc Af Amer: >60 mL/min (ref >60)
GFR calc non Af Amer: >60 mL/min (ref >60)
GFR: 78.33 mL/min (ref >60.00)
Glucose, Bld: 136 mg/dL — ABNORMAL HIGH (ref 70–99)
Glucose, Bld: 111 mg/dL — ABNORMAL HIGH (ref 70–99)
Potassium: 3.9 mEq/L (ref 3.5–5.1)
Potassium: 3.5 mmol/L (ref 3.5–5.1)
Sodium: 140 mEq/L (ref 135–145)
Sodium: 139 mmol/L (ref 135–145)
Total Bilirubin: 0.4 mg/dL (ref 0.2–1.2)
Total Bilirubin: 0.8 mg/dL (ref 0.3–1.2)
Total Protein: 5.9 g/dL — ABNORMAL LOW (ref 6.0–8.3)
Total Protein: 6.7 g/dL (ref 6.5–8.1)

## 2018-07-09 LAB — LIPID PANEL
Cholesterol: 230 mg/dL — ABNORMAL HIGH (ref 0–200)
HDL: 48.5 mg/dL (ref >39.00)
LDL Cholesterol: 145 mg/dL — ABNORMAL HIGH (ref 0–99)
NonHDL: 181.6
Total CHOL/HDL Ratio: 5
Triglycerides: 185 mg/dL — ABNORMAL HIGH (ref 0.0–149.0)
VLDL: 37 mg/dL (ref 0.0–40.0)

## 2018-07-09 LAB — CBC WITH DIFFERENTIAL/PLATELET
Abs Immature Granulocytes: 0.03 10*3/uL (ref 0.00–0.07)
Basophils Absolute: 0.1 10*3/uL (ref 0.0–0.1)
Basophils Relative: 1 %
Eosinophils Absolute: 0.3 10*3/uL (ref 0.0–0.5)
Eosinophils Relative: 3 %
HCT: 50.5 % (ref 39.0–52.0)
Hemoglobin: 17.5 g/dL — ABNORMAL HIGH (ref 13.0–17.0)
Immature Granulocytes: 0 %
Lymphocytes Relative: 24 %
Lymphs Abs: 2.1 10*3/uL (ref 0.7–4.0)
MCH: 32.6 pg (ref 26.0–34.0)
MCHC: 34.7 g/dL (ref 30.0–36.0)
MCV: 94 fL (ref 80.0–100.0)
Monocytes Absolute: 1 10*3/uL (ref 0.1–1.0)
Monocytes Relative: 12 %
Neutro Abs: 5.3 10*3/uL (ref 1.7–7.7)
Neutrophils Relative %: 60 %
Platelets: 180 10*3/uL (ref 150–400)
RBC: 5.37 MIL/uL (ref 4.22–5.81)
RDW: 14.7 % (ref 11.5–15.5)
WBC: 8.7 10*3/uL (ref 4.0–10.5)
nRBC: 0 % (ref 0.0–0.2)

## 2018-07-09 LAB — PROTIME-INR
INR: 0.8 (ref 0.8–1.2)
Prothrombin Time: 11.3 seconds — ABNORMAL LOW (ref 11.4–15.2)

## 2018-07-09 LAB — HEMOGLOBIN A1C: Hgb A1c MFr Bld: 5.6 % (ref 4.6–6.5)

## 2018-07-09 LAB — MICROALBUMIN / CREATININE URINE RATIO
Creatinine,U: 133.1 mg/dL
Microalb Creat Ratio: 147.8 mg/g — ABNORMAL HIGH (ref 0.0–30.0)
Microalb, Ur: 196.7 mg/dL — ABNORMAL HIGH (ref 0.0–1.9)

## 2018-07-09 LAB — URIC ACID: Uric Acid, Serum: 7.5 mg/dL (ref 4.0–7.8)

## 2018-07-09 MED ORDER — SODIUM CHLORIDE 0.9 % IV BOLUS
500.0000 mL | Freq: Once | INTRAVENOUS | Status: AC
  Administered 2018-07-09: 500 mL via INTRAVENOUS

## 2018-07-09 MED ORDER — ONDANSETRON HCL 4 MG/2ML IJ SOLN: 4.0000 mg | Freq: Four times a day (QID) | INTRAMUSCULAR | Status: DC | PRN

## 2018-07-09 MED ORDER — HYDROMORPHONE HCL 1 MG/ML IJ SOLN
1.0000 mg | Freq: Once | INTRAMUSCULAR | Status: AC
  Administered 2018-07-09: 1 mg via INTRAVENOUS
  Filled 2018-07-09: qty 1

## 2018-07-09 NOTE — ED Notes (Signed)
ED Provider at bedside. 

## 2018-07-09 NOTE — Assessment & Plan Note (Signed)
No claudication Encouraged smoking cessation Continue Atorvastatin and Plavix

## 2018-07-09 NOTE — ED Notes (Signed)
Pt educated to not drive after receiving Dilaudid during visit. Pt states his wife is taking him home.

## 2018-07-09 NOTE — Assessment & Plan Note (Signed)
Will have his set up lab only appt for uric acid level Continue Colchicine prn

## 2018-07-09 NOTE — Assessment & Plan Note (Signed)
Will have him set up lab only appt for CMET and Lipid Encouraged him to consume a low fat diet Continue Atorvastatin and Fish Oil for now

## 2018-07-09 NOTE — Patient Instructions (Signed)
Fat and Cholesterol Restricted Eating Plan Getting too much fat and cholesterol in your diet may cause health problems. Choosing the right foods helps keep your fat and cholesterol at normal levels. This can keep you from getting certain diseases. Your doctor may recommend an eating plan that includes:  Total fat: ______% or less of total calories a day.  Saturated fat: ______% or less of total calories a day.  Cholesterol: less than _________mg a day.  Fiber: ______g a day. What are tips for following this plan? Meal planning  At meals, divide your plate into four equal parts: ? Fill one-half of your plate with vegetables and green salads. ? Fill one-fourth of your plate with whole grains. ? Fill one-fourth of your plate with low-fat (lean) protein foods.  Eat fish that is high in omega-3 fats at least two times a week. This includes mackerel, tuna, sardines, and salmon.  Eat foods that are high in fiber, such as whole grains, beans, apples, broccoli, carrots, peas, and barley. General tips   Work with your doctor to lose weight if you need to.  Avoid: ? Foods with added sugar. ? Fried foods. ? Foods with partially hydrogenated oils.  Limit alcohol intake to no more than 1 drink a day for nonpregnant women and 2 drinks a day for men. One drink equals 12 oz of beer, 5 oz of wine, or 1 oz of hard liquor. Reading food labels  Check food labels for: ? Trans fats. ? Partially hydrogenated oils. ? Saturated fat (g) in each serving. ? Cholesterol (mg) in each serving. ? Fiber (g) in each serving.  Choose foods with healthy fats, such as: ? Monounsaturated fats. ? Polyunsaturated fats. ? Omega-3 fats.  Choose grain products that have whole grains. Look for the word "whole" as the first word in the ingredient list. Cooking  Cook foods using low-fat methods. These include baking, boiling, grilling, and broiling.  Eat more home-cooked foods. Eat at restaurants and buffets  less often.  Avoid cooking using saturated fats, such as butter, cream, palm oil, palm kernel oil, and coconut oil. Recommended foods  Fruits  All fresh, canned (in natural juice), or frozen fruits. Vegetables  Fresh or frozen vegetables (raw, steamed, roasted, or grilled). Green salads. Grains  Whole grains, such as whole wheat or whole grain breads, crackers, cereals, and pasta. Unsweetened oatmeal, bulgur, barley, quinoa, or brown rice. Corn or whole wheat flour tortillas. Meats and other protein foods  Ground beef (85% or leaner), grass-fed beef, or beef trimmed of fat. Skinless chicken or turkey. Ground chicken or turkey. Pork trimmed of fat. All fish and seafood. Egg whites. Dried beans, peas, or lentils. Unsalted nuts or seeds. Unsalted canned beans. Nut butters without added sugar or oil. Dairy  Low-fat or nonfat dairy products, such as skim or 1% milk, 2% or reduced-fat cheeses, low-fat and fat-free ricotta or cottage cheese, or plain low-fat and nonfat yogurt. Fats and oils  Tub margarine without trans fats. Light or reduced-fat mayonnaise and salad dressings. Avocado. Olive, canola, sesame, or safflower oils. The items listed above may not be a complete list of foods and beverages you can eat. Contact a dietitian for more information. Foods to avoid Fruits  Canned fruit in heavy syrup. Fruit in cream or butter sauce. Fried fruit. Vegetables  Vegetables cooked in cheese, cream, or butter sauce. Fried vegetables. Grains  White bread. White pasta. White rice. Cornbread. Bagels, pastries, and croissants. Crackers and snack foods that contain trans fat   and hydrogenated oils. Meats and other protein foods  Fatty cuts of meat. Ribs, chicken wings, bacon, sausage, bologna, salami, chitterlings, fatback, hot dogs, bratwurst, and packaged lunch meats. Liver and organ meats. Whole eggs and egg yolks. Chicken and turkey with skin. Fried meat. Dairy  Whole or 2% milk, cream,  half-and-half, and cream cheese. Whole milk cheeses. Whole-fat or sweetened yogurt. Full-fat cheeses. Nondairy creamers and whipped toppings. Processed cheese, cheese spreads, and cheese curds. Beverages  Alcohol. Sugar-sweetened drinks such as sodas, lemonade, and fruit drinks. Fats and oils  Butter, stick margarine, lard, shortening, ghee, or bacon fat. Coconut, palm kernel, and palm oils. Sweets and desserts  Corn syrup, sugars, honey, and molasses. Candy. Jam and jelly. Syrup. Sweetened cereals. Cookies, pies, cakes, donuts, muffins, and ice cream. The items listed above may not be a complete list of foods and beverages you should avoid. Contact a dietitian for more information. Summary  Choosing the right foods helps keep your fat and cholesterol at normal levels. This can keep you from getting certain diseases.  At meals, fill one-half of your plate with vegetables and green salads.  Eat high-fiber foods, like whole grains, beans, apples, carrots, peas, and barley.  Limit added sugar, saturated fats, alcohol, and fried foods. This information is not intended to replace advice given to you by your health care provider. Make sure you discuss any questions you have with your health care provider. Document Released: 09/03/2011 Document Revised: 11/05/2017 Document Reviewed: 11/19/2016 Elsevier Interactive Patient Education  2019 Elsevier Inc.   

## 2018-07-09 NOTE — Assessment & Plan Note (Signed)
Discussed how weight loss could help improve joint pain Continue Tramadol as needed

## 2018-07-09 NOTE — Discharge Instructions (Addendum)
Although you are feeling better, and have elected to leave tonight, do not hesitate to return here for concerning changes in your condition. Otherwise, please be sure to follow-up with your physician tomorrow.

## 2018-07-09 NOTE — ED Notes (Signed)
1808 Patient left room and tried to leave. Pt educated to please wait in room for MRI and to wait for discharge papers. Patient removed his IV himself in the room.

## 2018-07-09 NOTE — Assessment & Plan Note (Signed)
Continue Trazadone prn for now Will monitor

## 2018-07-09 NOTE — ED Provider Notes (Signed)
[facility] DEPT Provider Note   CSN: [id number] Arrival date & time: 07/09/18  1506    History   Chief Complaint Chief Complaint  Patient presents with   Back Pain    HPI Joshua CARREON is a 63 y.o. male.     HPI This adult male presents with new back pain, left lower extremity loss of sensation and strength. Onset was earlier today, relatively sudden, and there is accompanied lowering to the floor, though no true fall. Patient has multiple medical issues including diabetes, ongoing therapy for lung cancer, and back pain Back pain has been present for years, possibly decades, is typically moderate, controlled with tramadol, without the severity similar to today, and without associated weakness or loss of strength in the lower extremities. No new abdominal pain, incontinence. Since onset a few hours ago, symptoms been persistent, sharp severe 10/10 pain throughout the lower back, worse with attempted moving his legs, ambulation, and with new weakness in his lower extremities distally, primarily on the left.   Past Medical History:  Diagnosis Date   AC (acromioclavicular) joint bone spurs, unspecified laterality 11/25/2016   Arthritis    "back" (05/16/2015)   CAD in native artery, occluded Lcx and rotational atherectomy to LAD with DES 05/17/15 05/18/2015   Cavitary lesion of lung 02/25/2018   Chronic back pain    "all my back"   Chronic bronchitis (HCC)    COPD (chronic obstructive pulmonary disease) ([place])    Diabetes mellitus without complication (HCC)    GERD (gastroesophageal reflux disease)    H/O blood clots    "had them in my back; put filter in before one of my neck ORs"   History of gout    Hyperlipidemia    Hypertension    Neuromuscular disorder ([place])    PAD (peripheral artery disease) (HCC)    Phlebitis    Positive TB test    Seasonal allergies    Shortness of breath    Spinal disease     Patient  Active Problem List   Diagnosis Date Noted   Malignant neoplasm of upper lobe of right lung ([place]) 03/02/2018   Arthritis 01/11/2018   HLD (hyperlipidemia) 01/11/2018   Gout 01/11/2018   PAD (peripheral artery disease) ([place]) 01/07/2018   CAD in native artery, occluded Lcx and rotational atherectomy to LAD with DES 05/17/15 05/18/2015   Type 2 diabetes mellitus with peripheral neuropathy ([place]) 05/08/2015   GERD (gastroesophageal reflux disease) 03/21/2015   Insomnia 09/08/2012   Essential hypertension 11/20/2011   Moderate COPD (chronic obstructive pulmonary disease) ([place]) 11/20/2011   Peripheral vascular disease ([place]) 11/20/2011    Past Surgical History:  Procedure Laterality Date   ABDOMINAL AORTOGRAM W/LOWER EXTREMITY  01/07/2018   ABDOMINAL AORTOGRAM W/LOWER EXTREMITY N/A 01/07/2018   Procedure: ABDOMINAL AORTOGRAM W/LOWER EXTREMITY;  Surgeon: Wellington Hampshire, MD;  Location: [facility];  Service: Cardiovascular;  Laterality: N/A;   ANTERIOR CERVICAL DECOMP/DISCECTOMY FUSION  ~ 2001-2009 X 3   ANTERIOR CERVICAL DECOMP/DISCECTOMY FUSION N/A 10/06/2012   Procedure: ANTERIOR CERVICAL DECOMPRESSION/DISCECTOMY FUSION 1 LEVEL;  Surgeon: [name], MD;  Location: [facility];  Service: Neurosurgery;  Laterality: N/A;  C7T1 anterior cervical decompression with fusion plating and bonegraft    BACK SURGERY     CARDIAC CATHETERIZATION  05/16/2015   CARDIAC CATHETERIZATION N/A 05/16/2015   Procedure: Left Heart Cath and Coronary Angiography;  Surgeon: [name], MD;  Location: [facility];  Service: Cardiovascular;  Laterality: N/A;   CARDIAC CATHETERIZATION N/A 05/16/2015   Procedure: Coronary Balloon Angioplasty;  Surgeon: [name], MD;  Location: [facility];  Service: Cardiovascular;  Laterality: N/A;   CARDIAC CATHETERIZATION N/A 05/17/2015   Procedure: Coronary Stent Intervention Rotoblater;  Surgeon: [name], MD;  Location:  [facility];  Service: Cardiovascular;  Laterality: N/A;   ELBOW SURGERY Left 2016   "had bone pieces removed"   ESOPHAGEAL MANOMETRY N/A 12/30/2016   Procedure: ESOPHAGEAL MANOMETRY (EM);  Surgeon: Irene Shipper, MD;  Location: [facility];  Service: Endoscopy;  Laterality: N/A;   EXCISIONAL HEMORRHOIDECTOMY     FEMORAL BYPASS  ~ 2010   IR RADIOLOGIST EVAL & MGMT  12/17/2016   IVC FILTER PLACEMENT ([facility])  2010   "had blood clots in my back"   NECK SURGERY  2014   2008-2014   PERIPHERAL VASCULAR ATHERECTOMY  01/07/2018   Procedure: PERIPHERAL VASCULAR ATHERECTOMY;  Surgeon: Wellington Hampshire, MD;  Location: [facility];  Service: Cardiovascular;;   PERIPHERAL VASCULAR BALLOON ANGIOPLASTY  01/07/2018   Procedure: PERIPHERAL VASCULAR BALLOON ANGIOPLASTY;  Surgeon: Wellington Hampshire, MD;  Location: [facility];  Service: Cardiovascular;;   PERIPHERAL VASCULAR CATHETERIZATION N/A 02/14/2016   Procedure: Abdominal Aortogram w/Lower Extremity;  Surgeon: Wellington Hampshire, MD;  Location: [facility];  Service: Cardiovascular;  Laterality: N/A;   POSTERIOR LAMINECTOMY / DECOMPRESSION LUMBAR SPINE  2006   "bone spurs"   VIDEO BRONCHOSCOPY WITH ENDOBRONCHIAL NAVIGATION Right 02/25/2018   Procedure: VIDEO BRONCHOSCOPY WITH ENDOBRONCHIAL NAVIGATION;  Surgeon: Garner Nash, DO;  Location: [facility];  Service: Thoracic;  Laterality: Right;   VIDEO BRONCHOSCOPY WITH ENDOBRONCHIAL ULTRASOUND Right 02/25/2018   Procedure: VIDEO BRONCHOSCOPY WITH ENDOBRONCHIAL ULTRASOUND;  Surgeon: Garner Nash, DO;  Location: [facility];  Service: Thoracic;  Laterality: Right;        Home Medications    Prior to Admission medications   Medication Sig Start Date End Date Taking? Authorizing Provider  albuterol (PROAIR HFA) 108 (90 Base) MCG/ACT inhaler USE 2 PUFFS EVERY 6 HOURS  AS NEEDED FOR WHEEZING OR  SHORTNESS OF BREATH 03/25/18   Icard, Bradley L, DO  albuterol  (PROVENTIL) (2.5 MG/3ML) 0.083% nebulizer solution Take 3 mLs (2.5 mg total) by nebulization every 6 (six) hours as needed for wheezing or shortness of breath. 12/30/17   Icard, Leory Plowman L, DO  amLODipine (NORVASC) 5 MG tablet TAKE 1 TABLET BY MOUTH  DAILY 04/07/18   Wellington Hampshire, MD  aspirin EC 81 MG tablet Take 81 mg by mouth daily.    [provider]  atorvastatin (LIPITOR) 40 MG tablet Take 1 tablet (40 mg total) by mouth daily at 6 PM. 12/02/17   Wellington Hampshire, MD  Blood Glucose Monitoring Suppl (ONETOUCH VERIO) w/Device KIT 1 Device by Does not apply route once. 05/19/15   Jearld Fenton, NP  clopidogrel (PLAVIX) 75 MG tablet Take 1 tablet (75 mg total) by mouth daily. 01/08/18   Lyda Jester M, PA-C  diphenhydrAMINE (BENADRYL) 50 MG tablet Take 0.5 tablets (25 mg total) by mouth once. 1 hr prior to CT scan 12/02/16 03/12/18  Ardath Sax, MD  fexofenadine [facility] ALLERGY) 180 MG tablet Take 1 tablet (180 mg total) by mouth daily. 12/19/17   Jearld Fenton, NP  Fluticasone-Umeclidin-Vilant (TRELEGY ELLIPTA) 100-62.5-25 MCG/INH AEPB Inhale 1 puff into the lungs daily. 03/25/18   Garner Nash, DO  nitroGLYCERIN (  NITROSTAT) 0.4 MG SL tablet Place 1 tablet (0.4 mg total) under the tongue every 5 (five) minutes as needed for chest pain. 09/25/15   Jearld Fenton, NP  Omega-3 Fatty Acids (FISH OIL) 1000 MG CAPS Take 1,000 mg by mouth daily.     [provider]  omeprazole (PRILOSEC) 40 MG capsule Take 1 capsule (40 mg total) by mouth daily. PATIENT NEEDS OFFICE VISIT FOR FURTHER REFILLS 05/20/18   Irene Shipper, MD  ONE TOUCH LANCETS MISC 1 each by Does not apply route 3 (three) times daily. 05/19/15   Jearld Fenton, NP  ONETOUCH VERIO test strip USE TO TEST 3 TIMES DAILY 06/30/18   Jearld Fenton, NP  OVER THE COUNTER MEDICATION Apply 1 application topically daily as needed (neuropathy pain). Hemp oil cream    [provider]  traMADol (ULTRAM) 50 MG tablet  Take 1 tablet (50 mg total) by mouth every 8 (eight) hours as needed. 04/02/18   Jearld Fenton, NP  traZODone (DESYREL) 100 MG tablet TAKE 1 TABLET BY MOUTH AT  BEDTIME 02/27/18   Jearld Fenton, NP    Family History Family History  Problem Relation Age of Onset   Stomach cancer Mother        Deceased, 42s   Hypertension Mother    Hypertension Father    Stroke Father    Heart attack Father        Deceased, 49   Aneurysm Brother    Healthy Daughter    Healthy Maternal Grandmother    Leukemia Grandchild    Diabetes Neg Hx    Early death Neg Hx    Colon cancer Neg Hx    Pancreatic cancer Neg Hx     Social History Social History   Tobacco Use   Smoking status: Former Smoker    Packs/day: 0.75    Years: 49.00    Pack years: 36.75    Types: Cigarettes    Last attempt to quit: 03/11/2018    Years since quitting: 0.3   Smokeless tobacco: Never Used   Tobacco comment: half a pack a day 12/25/2017  Substance Use Topics   Alcohol use: Yes    Alcohol/week: 0.0 standard drinks    Comment: 2019 NO ALCOHOL IN 2 MONTHS   Drug use: No     Allergies   Lisinopril; Amitriptyline; Contrast media [iodinated diagnostic agents]; and Ibuprofen   Review of Systems Review of Systems  Constitutional:       Per HPI, otherwise negative  HENT:       Per HPI, otherwise negative  Respiratory:       Per HPI, otherwise negative  Cardiovascular:       Per HPI, otherwise negative  Gastrointestinal: Negative for vomiting.  Endocrine:       Negative aside from HPI  Genitourinary:       Neg aside from HPI   Musculoskeletal:       Per HPI, otherwise negative  Skin: Negative.   Allergic/Immunologic: Positive for immunocompromised state.  Neurological: Positive for weakness and numbness. Negative for syncope.  Hematological:       Lung malignancy, currently receiving radiation therapy     Physical Exam Updated Vital Signs BP (!) 146/80    Pulse 64    Temp 98.7 F  (37.1 C) (Oral)    Resp 16    SpO2 94%   Physical Exam Vitals signs and nursing note reviewed.  Constitutional:  General: He is not in acute distress.    Appearance: He is well-developed.     Comments: Large adult male awake and alert  HENT:     Head: Normocephalic and atraumatic.  Eyes:     Conjunctiva/sclera: Conjunctivae normal.  Cardiovascular:     Rate and Rhythm: Normal rate and regular rhythm.  Pulmonary:     Effort: Pulmonary effort is normal. No respiratory distress.     Breath sounds: No stridor.  Abdominal:     General: There is no distension.     Tenderness: There is no abdominal tenderness. There is no guarding.  Skin:    General: Skin is warm and dry.  Neurological:     Mental Status: He is alert and oriented to person, place, and time.     Comments: Patient has 5/5 in both lower extremities proximally, though with pain associated in the lower back with motion of either hip. In the left distal lower extremity, the patient has diminished strength, 3/5, and he is areflexic. Right distal lower extremities have normal reflex.      ED Treatments / Results  Labs (all labs ordered are listed, but only abnormal results are displayed) Labs Reviewed  COMPREHENSIVE METABOLIC PANEL - Abnormal; Notable for the following components:      Result Value   CO2 21 (*)    Glucose, Bld 111 (*)    Calcium 8.5 (*)    AST 51 (*)    All other components within normal limits  PROTIME-INR - Abnormal; Notable for the following components:   Prothrombin Time 11.3 (*)    All other components within normal limits  CBC WITH DIFFERENTIAL/PLATELET - Abnormal; Notable for the following components:   Hemoglobin 17.5 (*)    All other components within normal limits    EKG None  Radiology Dg Chest Port 1 View  Result Date: 07/09/2018 CLINICAL DATA:  Low back pain for 1 hour. Chronic back pain. No known injury. EXAM: PORTABLE CHEST 1 VIEW COMPARISON:  Radiographs 02/25/2018.  CT  07/01/2018. FINDINGS: 1528 hours. Two views obtained. The heart size and mediastinal contours are stable. The thin walled cavitary lesion in the right upper lobe is not well seen radiographically, although grossly stable. There is stable patchy airspace opacity at the left lung base. No new airspace disease, pleural effusion or pneumothorax. There are stable postsurgical changes in the lower cervical spine. IMPRESSION: No active cardiopulmonary process. No significant change from recent CT. Electronically Signed   By: Richardean Sale M.D.   On: 07/09/2018 16:05    Procedures Procedures (including critical care time)  Medications Ordered in ED Medications  ondansetron (ZOFRAN) injection 4 mg (has no administration in time range)  sodium chloride 0.9 % bolus 500 mL (0 mLs Intravenous Stopped 07/09/18 1649)  HYDROmorphone (DILAUDID) injection 1 mg (1 mg Intravenous Given 07/09/18 1614)     Initial Impression / Assessment and Plan / ED Course  I have reviewed the triage vital signs and the nursing notes.  Pertinent labs & imaging results that were available during my care of the patient were reviewed by me and considered in my medical decision making (see chart for details).        6:11 PM Patient has elected to leave prior to receiving MRI. Patient is awake and alert, ambulatory, states that he feels better having received medication here. X-ray reassuring, labs reassuring, and the patient's capacity to walk with additional reassurance of absence of acute new neurologic event. However, given  his substantial pain, history of cancer, concern for progression of disease, he was encouraged to follow-up with his physician tomorrow, and he acknowledges return precautions.  Final Clinical Impressions(s) / ED Diagnoses   Final diagnoses:  Acute bilateral low back pain with bilateral sciatica    ED Discharge Orders    None       Carmin Muskrat, MD 07/09/18 [phone]

## 2018-07-09 NOTE — Assessment & Plan Note (Signed)
Will have him set up lab only appt for CMET and Lipid Encouraged him to consume a low fat diet Continue Plavix, Atorvastatin and Fish Oil for now

## 2018-07-09 NOTE — Progress Notes (Signed)
Virtual Visit via Video Note  I connected with Joshua [name] on 07/09/18 at  9:15 AM EDT by a video enabled telemedicine application and verified that I am speaking with the correct person using two identifiers.   I discussed the limitations of evaluation and management by telemedicine and the availability of in person appointments. The patient expressed understanding and agreed to proceed.  Patient Location: Provider Location: Office  History of Present Illness:  Pt due for follow up of chronic conditions.  Arthritis: Mainly in his back. He takes Tramadol with good relief. He can not afford Lyrica. Xray from 2016-2017 reviewed.  HLD with CAD: His last LDL was 70, 11/2017. He is taking Fish Oil, Atorvastatin and Plavix as prescribed. He denies chest pain. He does not consume a low fat diet.Joshua [name]  COPD: He denies chronic cough or shortness of breath. He has started smoking again. He is using Trelegy as prescribed and Albuterol as needed. PFT's from 01/2018 reviewed. He follows with Dr. Valeta Harms.  Lung Cancer: s/p radiation. He is following with Dr. Lisbeth Renshaw.  GERD: He denies breakthrough on Omeprazole. Upper GI from 12/2016 reviewed.  Gout: He reports a flare in his right big toe 2 weeks ago. He takes Colchicine as needed with good relief.  HTN: His BP at home is running low 100/80's. He denies dizziness or lightheadedness. He is taking Amlodipine as prescribed. ECG from 10/2017 reviewed.  PAD/PVDHe denies claudication symptoms. He is taking Atorvastatin and Plavix as prescribed. He follows with Dr. Fletcher [name].  DM 2: His last A1C was 5.1%, 02/2018. He is not taking any oral diabetic medication at this time. He does not check his sugars or his feet routinely. His last eye exam was 02/2018. Flu 12/2017. Pneumovax 02/2015.   Insomnia: He has difficulty staying asleep. He takes Trazadone as prescribed with some relief. Sleep study from 04/2015 reviewed.     Past Medical History:  Diagnosis Date  .  AC (acromioclavicular) joint bone spurs, unspecified laterality 11/25/2016  . Arthritis    "back" (05/16/2015)  . CAD in native artery, occluded Lcx and rotational atherectomy to LAD with DES 05/17/15 05/18/2015  . Cavitary lesion of lung 02/25/2018  . Chronic back pain    "all my back"  . Chronic bronchitis ([place])   . COPD (chronic obstructive pulmonary disease) ([place])   . Diabetes mellitus without complication ([place])   . GERD (gastroesophageal reflux disease)   . H/O blood clots    "had them in my back; put filter in before one of my neck ORs"  . History of gout   . Hyperlipidemia   . Hypertension   . Neuromuscular disorder ([place])   . PAD (peripheral artery disease) ([place])   . Phlebitis   . Positive TB test   . Seasonal allergies   . Shortness of breath   . Spinal disease     Current Outpatient Medications  Medication Sig Dispense Refill  . albuterol (PROAIR HFA) 108 (90 Base) MCG/ACT inhaler USE 2 PUFFS EVERY 6 HOURS&nbsp;&nbsp;AS NEEDED FOR WHEEZING OR&nbsp;&nbsp;SHORTNESS OF BREATH 34 g 3  . albuterol (PROVENTIL) (2.5 MG/3ML) 0.083% nebulizer solution Take 3 mLs (2.5 mg total) by nebulization every 6 (six) hours as needed for wheezing or shortness of breath. 75 mL 5  . amLODipine (NORVASC) 5 MG tablet TAKE 1 TABLET BY MOUTH  DAILY 90 tablet 1  . aspirin EC 81 MG tablet Take 81 mg by mouth daily.    Joshua [name] atorvastatin (LIPITOR) 40 MG tablet Take 1  tablet (40 mg total) by mouth daily at 6 PM. 90 tablet 1  . Blood Glucose Monitoring Suppl (ONETOUCH VERIO) w/Device KIT 1 Device by Does not apply route once. 1 kit 0  . clopidogrel (PLAVIX) 75 MG tablet Take 1 tablet (75 mg total) by mouth daily. 90 tablet 3  . diphenhydrAMINE (BENADRYL) 50 MG tablet Take 0.5 tablets (25 mg total) by mouth once. 1 hr prior to CT scan 30 tablet 0  . fexofenadine (ALLEGRA ALLERGY) 180 MG tablet Take 1 tablet (180 mg total) by mouth daily. 90 tablet 3  . Fluticasone-Umeclidin-Vilant (TRELEGY ELLIPTA) 100-62.5-25  MCG/INH AEPB Inhale 1 puff into the lungs daily. 28 each 5  . Fluticasone-Umeclidin-Vilant (TRELEGY ELLIPTA) 100-62.5-25 MCG/INH AEPB Inhale 1 puff into the lungs daily. 2 each 5  . nitroGLYCERIN (NITROSTAT) 0.4 MG SL tablet Place 1 tablet (0.4 mg total) under the tongue every 5 (five) minutes as needed for chest pain. 25 tablet 4  . Omega-3 Fatty Acids (FISH OIL) 1000 MG CAPS Take 1,000 mg by mouth daily.     Joshua [name] omeprazole (PRILOSEC) 40 MG capsule Take 1 capsule (40 mg total) by mouth daily. PATIENT NEEDS OFFICE VISIT FOR FURTHER REFILLS 90 capsule 0  . ONE TOUCH LANCETS MISC 1 each by Does not apply route 3 (three) times daily. 200 each 5  . ONETOUCH VERIO test strip USE TO TEST 3 TIMES DAILY 100 each 0  . OVER THE COUNTER MEDICATION Apply 1 application topically daily as needed (neuropathy pain). Hemp oil cream    . traMADol (ULTRAM) 50 MG tablet Take 1 tablet (50 mg total) by mouth every 8 (eight) hours as needed. 15 tablet 0  . traZODone (DESYREL) 100 MG tablet TAKE 1 TABLET BY MOUTH AT  BEDTIME 90 tablet 1   No current facility-administered medications for this visit.     Allergies  Allergen Reactions  . Lisinopril Hives and Itching  . Amitriptyline Other (See Comments)    Makes pt feel "weird" and nausea.  . Contrast Media [Iodinated Diagnostic Agents] Hives and Itching    Pt can use if taking benadryl   . Ibuprofen Other (See Comments)    Internal bleeding    Family History  Problem Relation Age of Onset  . Stomach cancer Mother        Deceased, 24s  . Hypertension Mother   . Hypertension Father   . Stroke Father   . Heart attack Father        Deceased, 15  . Aneurysm Brother   . Healthy Daughter   . Healthy Maternal Grandmother   . Leukemia Grandchild   . Diabetes Neg Hx   . Early death Neg Hx   . Colon cancer Neg Hx   . Pancreatic cancer Neg Hx     Social History   Socioeconomic History  . Marital status: Married    Spouse name: Not on file  . Number of  children: Not on file  . Years of education: 7  . Highest education level: Not on file  Occupational History  . Occupation: Disabled    Employer: DISABLED  Social Needs  . Financial resource strain: Not on file  . Food insecurity:    Worry: Not on file    Inability: Not on file  . Transportation needs:    Medical: No    Non-medical: No  Tobacco Use  . Smoking status: Former Smoker    Packs/day: 0.75    Years: 49.00    Pack  years: 36.75    Types: Cigarettes    Last attempt to quit: 03/11/2018    Years since quitting: 0.3  . Smokeless tobacco: Never Used  . Tobacco comment: half a pack a day 12/25/2017  Substance and Sexual Activity  . Alcohol use: Yes    Alcohol/week: 0.0 standard drinks    Comment: 2019 NO ALCOHOL IN 2 MONTHS  . Drug use: No  . Sexual activity: Not Currently  Lifestyle  . Physical activity:    Days per week: Not on file    Minutes per session: Not on file  . Stress: Not on file  Relationships  . Social connections:    Talks on phone: Not on file    Gets together: Not on file    Attends religious service: Not on file    Active member of club or organization: Not on file    Attends meetings of clubs or organizations: Not on file    Relationship status: Not on file  . Intimate partner violence:    Fear of current or ex partner: Not on file    Emotionally abused: Not on file    Physically abused: Not on file    Forced sexual activity: Not on file  Other Topics Concern  . Not on file  Social History Narrative   Regular exercise-no   Caffeine Use-yes   Lives with wife in a one story home. Has 2 children.  On disability for low back pain.  Used to work as a Games developer, lasted worked in 2007.     Education: 7th grade.     Constitutional: Denies fever, malaise, fatigue, headache or abrupt weight changes.  HEENT: Denies eye pain, eye redness, ear pain, ringing in the ears, wax buildup, runny nose, nasal congestion, bloody nose, or sore  throat. Respiratory: Denies difficulty breathing, shortness of breath, cough or sputum production.   Cardiovascular: Denies chest pain, chest tightness, palpitations or swelling in the hands or feet.  Gastrointestinal: Denies abdominal pain, bloating, constipation, diarrhea or blood in the stool.  GU: Denies urgency, frequency, pain with urination, burning sensation, blood in urine, odor or discharge. Musculoskeletal: Pt reports joint pain. Denies decrease in range of motion, difficulty with gait, muscle pain or joint swelling.  Skin: Denies redness, rashes, lesions or ulcercations.  Neurological: Pt reports neuropathic pain in legs. Denies dizziness, difficulty with memory, difficulty with speech or problems with balance and coordination.  Psych: Denies anxiety, depression, SI/HI.  No other specific complaints in a complete review of systems (except as listed in HPI above).  Wt Readings from Last 3 Encounters:  04/02/18 217 lb 6.4 oz (98.6 kg)  04/01/18 215 lb (97.5 kg)  03/26/18 217 lb (98.4 kg)    General: Appears his stated age, obese, in NAD. Skin: Warm, dry and intact.  Pulmonary/Chest: Normal effort. No respiratory distress.  Abdomen: No obvious distention. Neurological: Alert and oriented.  Psychiatric: Mood and affect normal. Behavior is normal. Judgment and thought content normal.     BMET    Component Value Date/Time   NA 137 04/01/2018 1607   NA 139 01/02/2018 0927   NA 140 11/25/2016 1430   K 3.7 04/01/2018 1607   K 4.3 11/25/2016 1430   CL 101 04/01/2018 1607   CO2 29 04/01/2018 1607   CO2 27 11/25/2016 1430   GLUCOSE 129 (H) 04/01/2018 1607   GLUCOSE 101 11/25/2016 1430   BUN 13 04/01/2018 1607   BUN 12 01/02/2018 0927   BUN 12.6 11/25/2016  1430   CREATININE 0.92 04/01/2018 1607   CREATININE 0.9 11/25/2016 1430   CALCIUM 9.5 04/01/2018 1607   CALCIUM 10.3 11/25/2016 1430   GFRNONAA >60 02/18/2018 0923   GFRAA >60 02/18/2018 0923    Lipid Panel      Component Value Date/Time   CHOL 215 (H) 11/21/2017 0810   TRIG 94 11/21/2017 0810   HDL 126 11/21/2017 0810   CHOLHDL 1.7 11/21/2017 0810   CHOLHDL 3 10/14/2016 1440   VLDL 25.0 10/14/2016 1440   LDLCALC 70 11/21/2017 0810    CBC    Component Value Date/Time   WBC 11.7 (H) 04/01/2018 1517   RBC 5.30 04/01/2018 1517   HGB 17.0 04/01/2018 1517   HGB 16.4 01/02/2018 0927   HGB 19.2 (H) 11/25/2016 1430   HCT 49.8 04/01/2018 1517   HCT 46.2 01/02/2018 0927   HCT 54.8 (H) 11/25/2016 1430   PLT 227.0 04/01/2018 1517   PLT 269 01/02/2018 0927   MCV 93.8 04/01/2018 1517   MCV 97 01/02/2018 0927   MCV 92.3 11/25/2016 1430   MCH 32.4 02/18/2018 0923   MCHC 34.1 04/01/2018 1517   RDW 14.0 04/01/2018 1517   RDW 13.9 01/02/2018 0927   RDW 15.0 (H) 11/25/2016 1430   LYMPHSABS 2.5 11/25/2016 1430   MONOABS 1.3 (H) 11/25/2016 1430   EOSABS 0.3 11/25/2016 1430   BASOSABS 0.0 11/25/2016 1430    Hgb A1C Lab Results  Component Value Date   HGBA1C 5.1 02/18/2018         Assessment and Plan:  See problem based charting  Follow Up Instructions:    I discussed the assessment and treatment plan with the patient. The patient was provided an opportunity to ask questions and all were answered. The patient agreed with the plan and demonstrated an understanding of the instructions.   The patient was advised to call back or seek an in-person evaluation if the symptoms worsen or if the condition fails to improve as anticipated.     Webb Silversmith, NP

## 2018-07-09 NOTE — Assessment & Plan Note (Signed)
Discussed how weight loss and avoiding foods that trigger reflux can help reduce symptoms Will have him make lab only appt for CBC and CMET Continue Omeprazole for now

## 2018-07-09 NOTE — ED Triage Notes (Addendum)
Patient c/o lower back pain x1 hour. Reports chronic back pain but worsening today. Denies new injury. Denies changes in bowel or bladder. Reports tingling at baseline, hx neuropathy.

## 2018-07-09 NOTE — Assessment & Plan Note (Signed)
No real swelling or ulcers Will monitor

## 2018-07-09 NOTE — Assessment & Plan Note (Signed)
Reinforced DASH diet and exercise for weight loss Continue Amlodipine for now Will have him set up lab only appt for CMET

## 2018-07-09 NOTE — Assessment & Plan Note (Signed)
Will have him set up lab only appt for CMET, Lipid, A1C and urine microalbumin Encouraged him to consume a low carb diet and exercise for weight loss Encouraged routine foot exams Encouraged yearly eye exams Flu and pneumovax UTD

## 2018-07-09 NOTE — Assessment & Plan Note (Signed)
Getting radiation Encouraged smoking cessation CT chest reviewed He will continue to follow with oncology

## 2018-07-09 NOTE — ED Notes (Signed)
Xray bedside.

## 2018-07-10 ENCOUNTER — Telehealth: Payer: Self-pay | Admitting: *Deleted

## 2018-07-10 NOTE — Telephone Encounter (Signed)
Patient's wife left a voicemail stating that patient is in a lot of back pain. Mrs.Cromartie stated that patient feels that something is broken in his back and request a call back. See ER notes from yesterday.

## 2018-07-10 NOTE — Telephone Encounter (Signed)
Set up for ER follow up, [email or web address]

## 2018-07-14 NOTE — Telephone Encounter (Signed)
I left second voicemail on cell phone today for call back.

## 2018-07-16 ENCOUNTER — Other Ambulatory Visit: Payer: Self-pay | Admitting: Internal Medicine

## 2018-07-16 ENCOUNTER — Other Ambulatory Visit: Payer: Self-pay | Admitting: Cardiovascular Disease

## 2018-07-16 DIAGNOSIS — I739 Peripheral vascular disease, unspecified: Secondary | ICD-10-CM

## 2018-07-16 DIAGNOSIS — Z79899 Other long term (current) drug therapy: Secondary | ICD-10-CM

## 2018-07-16 DIAGNOSIS — I10 Essential (primary) hypertension: Secondary | ICD-10-CM

## 2018-07-16 NOTE — Telephone Encounter (Signed)
I left another msg, asking pt to call back.

## 2018-07-17 ENCOUNTER — Ambulatory Visit (INDEPENDENT_AMBULATORY_CARE_PROVIDER_SITE_OTHER): Payer: Medicare Other | Admitting: Internal Medicine

## 2018-07-17 ENCOUNTER — Encounter: Payer: Self-pay | Admitting: Internal Medicine

## 2018-07-17 DIAGNOSIS — M5442 Lumbago with sciatica, left side: Secondary | ICD-10-CM | POA: Diagnosis not present

## 2018-07-17 MED ORDER — PREDNISONE 10 MG PO TABS: ORAL_TABLET | ORAL | 0 refills | Status: DC

## 2018-07-17 MED ORDER — TRAMADOL HCL 50 MG PO TABS: 50.0000 mg | ORAL_TABLET | Freq: Three times a day (TID) | ORAL | 0 refills | Status: AC | PRN

## 2018-07-17 MED ORDER — CYCLOBENZAPRINE HCL 10 MG PO TABS: 10.0000 mg | ORAL_TABLET | Freq: Three times a day (TID) | ORAL | 0 refills | Status: DC | PRN

## 2018-07-17 NOTE — Progress Notes (Signed)
Virtual Visit via Video Note  I connected with Joshua [name] on 07/17/18 at  2:15 PM EDT by a video enabled telemedicine application and verified that I am speaking with the correct person using two identifiers.  Location: Patient: In his Personal Vehicle Provider: Home   I discussed the limitations of evaluation and management by telemedicine and the availability of in person appointments. The patient expressed understanding and agreed to proceed.  History of Present Illness:   Pt due for ER follow up. He went to the ER 4/23 wit hc/o worsening low back pain with tingling and weakness in his left leg. No xray was obtained. He was given Dilaudid 1 mg with significant improvement. MRI lumbar was ordered but he decided to leave before it was completed. He was discharged home and advised to follow up with his PCP. Since discharge, he reports he continues to have back pain. The pain is across the entire lower back. He describes the pain as sharp and stabbing. The pain does not radiate. He reports tingling and weakness in left leg. He subsequently had a fall down some steps the day after he went to the ER but no real injury. He denies issues with bowel or bladder. He is taking ASA with minimal relief.   Past Medical History:  Diagnosis Date  . AC (acromioclavicular) joint bone spurs, unspecified laterality 11/25/2016  . Arthritis    "back" (05/16/2015)  . CAD in native artery, occluded Lcx and rotational atherectomy to LAD with DES 05/17/15 05/18/2015  . Cavitary lesion of lung 02/25/2018  . Chronic back pain    "all my back"  . Chronic bronchitis ([place])   . COPD (chronic obstructive pulmonary disease) ([place])   . Diabetes mellitus without complication ([place])   . GERD (gastroesophageal reflux disease)   . H/O blood clots    "had them in my back; put filter in before one of my neck ORs"  . History of gout   . Hyperlipidemia   . Hypertension   . Neuromuscular disorder ([place])   . PAD (peripheral  artery disease) ([place])   . Phlebitis   . Positive TB test   . Seasonal allergies   . Shortness of breath   . Spinal disease     Current Outpatient Medications  Medication Sig Dispense Refill  . albuterol (PROAIR HFA) 108 (90 Base) MCG/ACT inhaler USE 2 PUFFS EVERY 6 HOURS&nbsp;&nbsp;AS NEEDED FOR WHEEZING OR&nbsp;&nbsp;SHORTNESS OF BREATH 34 g 3  . albuterol (PROVENTIL) (2.5 MG/3ML) 0.083% nebulizer solution Take 3 mLs (2.5 mg total) by nebulization every 6 (six) hours as needed for wheezing or shortness of breath. 75 mL 5  . amLODipine (NORVASC) 5 MG tablet TAKE 1 TABLET BY MOUTH  DAILY 90 tablet 1  . aspirin EC 81 MG tablet Take 81 mg by mouth daily.    [name] atorvastatin (LIPITOR) 40 MG tablet TAKE 1 TABLET BY MOUTH  DAILY AT 6 PM. 90 tablet 0  . Blood Glucose Monitoring Suppl (ONETOUCH VERIO) w/Device KIT 1 Device by Does not apply route once. 1 kit 0  . clopidogrel (PLAVIX) 75 MG tablet Take 1 tablet (75 mg total) by mouth daily. 90 tablet 3  . fexofenadine (ALLEGRA ALLERGY) 180 MG tablet Take 1 tablet (180 mg total) by mouth daily. 90 tablet 3  . Fluticasone-Umeclidin-Vilant (TRELEGY ELLIPTA) 100-62.5-25 MCG/INH AEPB Inhale 1 puff into the lungs daily. 2 each 5  . nitroGLYCERIN (NITROSTAT) 0.4 MG SL tablet Place 1 tablet (0.4 mg total) under the  tongue every 5 (five) minutes as needed for chest pain. 25 tablet 4  . Omega-3 Fatty Acids (FISH OIL) 1000 MG CAPS Take 1,000 mg by mouth daily.     [name] omeprazole (PRILOSEC) 40 MG capsule Take 1 capsule (40 mg total) by mouth daily. PATIENT NEEDS OFFICE VISIT FOR FURTHER REFILLS 90 capsule 0  . ONE TOUCH LANCETS MISC 1 each by Does not apply route 3 (three) times daily. 200 each 5  . ONETOUCH VERIO test strip USE TO TEST 3 TIMES DAILY 100 each 0  . OVER THE COUNTER MEDICATION Apply 1 application topically daily as needed (neuropathy pain). Hemp oil cream    . traMADol (ULTRAM) 50 MG tablet Take 1 tablet (50 mg total) by mouth every 8 (eight) hours as  needed. 15 tablet 0  . traZODone (DESYREL) 100 MG tablet TAKE 1 TABLET BY MOUTH AT  BEDTIME 90 tablet 1  . diphenhydrAMINE (BENADRYL) 50 MG tablet Take 0.5 tablets (25 mg total) by mouth once. 1 hr prior to CT scan 30 tablet 0   No current facility-administered medications for this visit.     Allergies  Allergen Reactions  . Lisinopril Hives and Itching  . Amitriptyline Other (See Comments)    Makes pt feel "weird" and nausea.  . Contrast Media [Iodinated Diagnostic Agents] Hives and Itching    Pt can use if taking benadryl   . Ibuprofen Other (See Comments)    Internal bleeding    Family History  Problem Relation Age of Onset  . Stomach cancer Mother        Deceased, 70s  . Hypertension Mother   . Hypertension Father   . Stroke Father   . Heart attack Father        Deceased, 90  . Aneurysm Brother   . Healthy Daughter   . Healthy Maternal Grandmother   . Leukemia Grandchild   . Diabetes Neg Hx   . Early death Neg Hx   . Colon cancer Neg Hx   . Pancreatic cancer Neg Hx     Social History   Socioeconomic History  . Marital status: Married    Spouse name: Not on file  . Number of children: Not on file  . Years of education: 7  . Highest education level: Not on file  Occupational History  . Occupation: Disabled    Employer: DISABLED  Social Needs  . Financial resource strain: Not on file  . Food insecurity:    Worry: Not on file    Inability: Not on file  . Transportation needs:    Medical: No    Non-medical: No  Tobacco Use  . Smoking status: Former Smoker    Packs/day: 0.75    Years: 49.00    Pack years: 36.75    Types: Cigarettes    Last attempt to quit: 03/11/2018    Years since quitting: 0.3  . Smokeless tobacco: Never Used  . Tobacco comment: half a pack a day 12/25/2017  Substance and Sexual Activity  . Alcohol use: Yes    Alcohol/week: 0.0 standard drinks    Comment: 2019 NO ALCOHOL IN 2 MONTHS  . Drug use: No  . Sexual activity: Not  Currently  Lifestyle  . Physical activity:    Days per week: Not on file    Minutes per session: Not on file  . Stress: Not on file  Relationships  . Social connections:    Talks on phone: Not on file    Gets  together: Not on file    Attends religious service: Not on file    Active member of club or organization: Not on file    Attends meetings of clubs or organizations: Not on file    Relationship status: Not on file  . Intimate partner violence:    Fear of current or ex partner: Not on file    Emotionally abused: Not on file    Physically abused: Not on file    Forced sexual activity: Not on file  Other Topics Concern  . Not on file  Social History Narrative   Regular exercise-no   Caffeine Use-yes   Lives with wife in a one story home. Has 2 children.  On disability for low back pain.  Used to work as a Games developer, lasted worked in 2007.     Education: 7th grade.     Constitutional: Denies fever, malaise, fatigue, headache or abrupt weight changes.  Musculoskeletal: Pt reports back pain, weakness in left leg. Denies decrease in range of motion,  muscle pain or joint  swelling.  Skin: Denies redness, rashes, lesions or ulcercations.  Neurological: Pt reports tingling in left leg, difficulty with balance. Denies dizziness, difficulty with memory, difficulty with speech or problems with coordination.    No other specific complaints in a complete review of systems (except as listed in HPI above).   Wt Readings from Last 3 Encounters:  04/02/18 217 lb 6.4 oz (98.6 kg)  04/01/18 215 lb (97.5 kg)  03/26/18 217 lb (98.4 kg)    General: Appears his stated age, chronically ill appearing, in NAD. Skin: Warm, dry and intact. No obvious bruising or abrasions. Pulmonary/Chest: Normal effort. No respiratory distress.  Musculoskeletal: Unable to assess ROM, strength or gait  Neurological: Alert and oriented.    BMET    Component Value Date/Time   NA 139 07/09/2018 1610   NA  139 01/02/2018 0927   NA 140 11/25/2016 1430   K 3.5 07/09/2018 1610   K 4.3 11/25/2016 1430   CL 106 07/09/2018 1610   CO2 21 (L) 07/09/2018 1610   CO2 27 11/25/2016 1430   GLUCOSE 111 (H) 07/09/2018 1610   GLUCOSE 101 11/25/2016 1430   BUN 10 07/09/2018 1610   BUN 12 01/02/2018 0927   BUN 12.6 11/25/2016 1430   CREATININE 0.97 07/09/2018 1610   CREATININE 0.9 11/25/2016 1430   CALCIUM 8.5 (L) 07/09/2018 1610   CALCIUM 10.3 11/25/2016 1430   GFRNONAA >60 07/09/2018 1610   GFRAA >60 07/09/2018 1610    Lipid Panel     Component Value Date/Time   CHOL 230 (H) 07/09/2018 1040   CHOL 215 (H) 11/21/2017 0810   TRIG 185.0 (H) 07/09/2018 1040   HDL 48.50 07/09/2018 1040   HDL 126 11/21/2017 0810   CHOLHDL 5 07/09/2018 1040   VLDL 37.0 07/09/2018 1040   LDLCALC 145 (H) 07/09/2018 1040   LDLCALC 70 11/21/2017 0810    CBC    Component Value Date/Time   WBC 8.7 07/09/2018 1610   RBC 5.37 07/09/2018 1610   HGB 17.5 (H) 07/09/2018 1610   HGB 16.4 01/02/2018 0927   HGB 19.2 (H) 11/25/2016 1430   HCT 50.5 07/09/2018 1610   HCT 46.2 01/02/2018 0927   HCT 54.8 (H) 11/25/2016 1430   PLT 180 07/09/2018 1610   PLT 269 01/02/2018 0927   MCV 94.0 07/09/2018 1610   MCV 97 01/02/2018 0927   MCV 92.3 11/25/2016 1430   MCH 32.6 07/09/2018 1610  MCHC 34.7 07/09/2018 1610   RDW 14.7 07/09/2018 1610   RDW 13.9 01/02/2018 0927   RDW 15.0 (H) 11/25/2016 1430   LYMPHSABS 2.1 07/09/2018 1610   LYMPHSABS 2.5 11/25/2016 1430   MONOABS 1.0 07/09/2018 1610   MONOABS 1.3 (H) 11/25/2016 1430   EOSABS 0.3 07/09/2018 1610   EOSABS 0.3 11/25/2016 1430   BASOSABS 0.1 07/09/2018 1610   BASOSABS 0.0 11/25/2016 1430    Hgb A1C Lab Results  Component Value Date   HGBA1C 5.6 07/09/2018         Assessment and Plan:  ER Follow up for Acute Back Pain with Left Sided Sciatica:  ER notes reviewed Pain not improved with subsequent fall RX for Pred Taper x 9 days RX for Flexeril 10 mgTID  prn- sedation caution given RX for Tramadol as needed for severe pain Consider PT, MRI if pain persists or worsens  Return precautions discussed  Follow Up Instructions:    I discussed the assessment and treatment plan with the patient. The patient was provided an opportunity to ask questions and all were answered. The patient agreed with the plan and demonstrated an understanding of the instructions.   The patient was advised to call back or seek an in-person evaluation if the symptoms worsen or if the condition fails to improve as anticipated.    Webb Silversmith, NP

## 2018-07-17 NOTE — Patient Instructions (Signed)

## 2018-07-24 MED ORDER — LOSARTAN POTASSIUM 25 MG PO TABS: 25.0000 mg | ORAL_TABLET | Freq: Every day | ORAL | 0 refills | Status: DC

## 2018-07-24 NOTE — Addendum Note (Signed)
Addended by: Lurlean Nanny on: 07/24/2018 12:55 PM   Modules accepted: Orders

## 2018-07-27 ENCOUNTER — Other Ambulatory Visit: Payer: Self-pay | Admitting: Internal Medicine

## 2018-07-28 ENCOUNTER — Ambulatory Visit (HOSPITAL_COMMUNITY)
Admission: RE | Admit: 2018-07-28 | Payer: Medicare Other | Source: Ambulatory Visit | Attending: Cardiovascular Disease | Admitting: Cardiovascular Disease

## 2018-07-31 DIAGNOSIS — J449 Chronic obstructive pulmonary disease, unspecified: Secondary | ICD-10-CM | POA: Diagnosis not present

## 2018-08-11 ENCOUNTER — Encounter (HOSPITAL_COMMUNITY): Payer: Self-pay

## 2018-08-17 ENCOUNTER — Telehealth: Payer: Self-pay | Admitting: *Deleted

## 2018-08-17 NOTE — Telephone Encounter (Signed)
A message was left, re: follow up visit. 

## 2018-08-22 ENCOUNTER — Other Ambulatory Visit: Payer: Self-pay | Admitting: Internal Medicine

## 2018-08-24 NOTE — Telephone Encounter (Signed)
Second message left,re: follow up visit.

## 2018-08-27 ENCOUNTER — Other Ambulatory Visit: Payer: Self-pay | Admitting: Internal Medicine

## 2018-08-27 DIAGNOSIS — K253 Acute gastric ulcer without hemorrhage or perforation: Secondary | ICD-10-CM

## 2018-08-27 DIAGNOSIS — K269 Duodenal ulcer, unspecified as acute or chronic, without hemorrhage or perforation: Secondary | ICD-10-CM

## 2018-08-31 ENCOUNTER — Encounter: Payer: Self-pay | Admitting: Internal Medicine

## 2018-08-31 DIAGNOSIS — J449 Chronic obstructive pulmonary disease, unspecified: Secondary | ICD-10-CM | POA: Diagnosis not present

## 2018-08-31 NOTE — Telephone Encounter (Signed)
A message was left with Mrs. Hoaglin,re: follow up visit.

## 2018-09-16 ENCOUNTER — Ambulatory Visit (INDEPENDENT_AMBULATORY_CARE_PROVIDER_SITE_OTHER): Payer: Medicare Other | Admitting: Pulmonary Disease

## 2018-09-16 ENCOUNTER — Other Ambulatory Visit: Payer: Self-pay

## 2018-09-16 ENCOUNTER — Encounter: Payer: Self-pay | Admitting: Pulmonary Disease

## 2018-09-16 VITALS — BP 140/90 | HR 80 | Ht 69.0 in | Wt 219.2 lb

## 2018-09-16 DIAGNOSIS — C3411 Malignant neoplasm of upper lobe, right bronchus or lung: Secondary | ICD-10-CM | POA: Diagnosis not present

## 2018-09-16 DIAGNOSIS — Z87891 Personal history of nicotine dependence: Secondary | ICD-10-CM

## 2018-09-16 DIAGNOSIS — Z720 Tobacco use: Secondary | ICD-10-CM | POA: Insufficient documentation

## 2018-09-16 DIAGNOSIS — J449 Chronic obstructive pulmonary disease, unspecified: Secondary | ICD-10-CM

## 2018-09-16 MED ORDER — TRELEGY ELLIPTA 100-62.5-25 MCG/INH IN AEPB: 1.0000 | INHALATION_SPRAY | Freq: Every day | RESPIRATORY_TRACT | 5 refills | Status: DC

## 2018-09-16 MED ORDER — ALBUTEROL SULFATE (2.5 MG/3ML) 0.083% IN NEBU: 2.5000 mg | INHALATION_SOLUTION | Freq: Four times a day (QID) | RESPIRATORY_TRACT | 5 refills | Status: AC | PRN

## 2018-09-16 MED ORDER — ALBUTEROL SULFATE HFA 108 (90 BASE) MCG/ACT IN AERS: INHALATION_SPRAY | RESPIRATORY_TRACT | 3 refills | Status: DC

## 2018-09-16 NOTE — Progress Notes (Signed)
Synopsis: Referred in Oct. 2019 for COPD by Jearld Fenton, NP  Subjective:   PATIENT ID: Joshua [name] GENDER: male DOB: 04-Jul-1955, MRN: [id number]  Chief Complaint  Patient presents with  . Follow-up    He states his breathing has been at his base line but he has started back smoking.     PMH of tobacco abuse, smoked since age of 63, at heaviest was 1ppd and now down to 0.5ppd. History of back surgery complicated by DVT. He has planned intervention by cardiology for his PVD on his right leg in two weeks.  He was treated with antibiotics and steroids recently by his primary care provider.  He is recently at the end of his steroid taper.  Presents to the office today with significant dyspnea on exertion, congestion, cough, sputum production as well as chest tightness and wheezing.  He has difficulty getting up and walking from his chair to the bathroom.  He states that he received a breathing treatment by his primary care doctor and felt much better for several hours after that.  He is still continuing to smoke and he knows that he needs to quit but he has been unable to stop. He has been incarcerated x2 each time for a little less than a year.  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  He denies IV drug use history.  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  OV 02/05/2018: Since the patient was last seen in the office he has been doing much better.  He has started using his trilogy inhaler regularly.  He has cut down smoking.  He is only smoking 3 to 4 cigarettes/day.  He is definitely trying to quit.  Since he was last seen in the office we also completed a low-dose lung cancer screening CT which was read as a lung RADS for a.  This had a enlarging right upper lobe cavitary/cystic lesion with areas of associated groundglass and semisolid material.  He does have dyspnea on exertion.  His daily cough is somewhat better.  He still has sputum production.   OV 03/02/2018: Since our last office visit the patient was taken for bronchoscopy on 12/11, ENB/Radial EBUS s/p sampling of RUL lesion + for pulmonary adenocarcinoma, TTF-1 and Napsin positive.  Since he was last seen he also had pulmonary function tests completed which demonstrated an FEV1 60% predicted postbronchodilator, 2.0 L.  He has stopped smoking as of last week.  Patient denies hemoptysis.  He has been wheezing a little bit and using his nebulized treatments at home.  Today we discussed his biopsy results in the office with his wife as well as patient today in the office.  OV 03/25/2018: Patient seen today for follow-up after referral to [facility].  Patient was also seen by 1 of our nurse practitioners a few weeks ago.  His breathing is much better.  Of note he has quit smoking.  At this point he has been well managed on his triple therapy inhaler and as needed albuterol.  He is only needed his albuterol 2-3 times per day.  Overall no hemoptysis.  No additional wheezing.  He does feel like his functional status is better.  He was seen by Dr. Servando Snare for evaluation of lobectomy.  He was concerned due to his ongoing respiratory symptoms.  I do believe he has improved since he was last seen by me in clinic.  OV  09/16/2018: Patient seen today here in follow-up after receiving new diagnosis of pulmonary adenocarcinoma back in December 2019.  Ultimately decided against lobectomy after concerns of surgery and evaluation by TCT S.  Patient underwent radiation therapy, by Dr. Lisbeth Renshaw.  Patient completed 57 Gy in 3 fractions via IMRT.  Today, overall doing well. Breathing is ok. Unfortunately, he went back to smoking. He would like to quit.  He does have daily sputum production.  He states this is better when he smokes less.  He understands correlation.    Past Medical History:  Diagnosis Date  . AC (acromioclavicular) joint bone spurs, unspecified laterality 11/25/2016  . Arthritis    "back" (05/16/2015)  . CAD  in native artery, occluded Lcx and rotational atherectomy to LAD with DES 05/17/15 05/18/2015  . Cavitary lesion of lung 02/25/2018  . Chronic back pain    "all my back"  . Chronic bronchitis ([place])   . COPD (chronic obstructive pulmonary disease) ([place])   . Diabetes mellitus without complication ([place])   . GERD (gastroesophageal reflux disease)   . H/O blood clots    "had them in my back; put filter in before one of my neck ORs"  . History of gout   . Hyperlipidemia   . Hypertension   . Neuromuscular disorder ([place])   . PAD (peripheral artery disease) ([place])   . Phlebitis   . Positive TB test   . Seasonal allergies   . Shortness of breath   . Spinal disease      Family History  Problem Relation Age of Onset  . Stomach cancer Mother        Deceased, 62s  . Hypertension Mother   . Hypertension Father   . Stroke Father   . Heart attack Father        Deceased, 82  . Aneurysm Brother   . Healthy Daughter   . Healthy Maternal Grandmother   . Leukemia Grandchild   . Diabetes Neg Hx   . Early death Neg Hx   . Colon cancer Neg Hx   . Pancreatic cancer Neg Hx      Past Surgical History:  Procedure Laterality Date  . ABDOMINAL AORTOGRAM W/LOWER EXTREMITY  01/07/2018  . ABDOMINAL AORTOGRAM W/LOWER EXTREMITY N/A 01/07/2018   Procedure: ABDOMINAL AORTOGRAM W/LOWER EXTREMITY;  Surgeon: Wellington Hampshire, MD;  Location: [facility];  Service: Cardiovascular;  Laterality: N/A;  . ANTERIOR CERVICAL DECOMP/DISCECTOMY FUSION  ~ 2001-2009 X 3  . ANTERIOR CERVICAL DECOMP/DISCECTOMY FUSION N/A 10/06/2012   Procedure: ANTERIOR CERVICAL DECOMPRESSION/DISCECTOMY FUSION 1 LEVEL;  Surgeon: [name], MD;  Location: [facility];  Service: Neurosurgery;  Laterality: N/A;  C7T1 anterior cervical decompression with fusion plating and bonegraft   . BACK SURGERY    . CARDIAC CATHETERIZATION  05/16/2015  . CARDIAC CATHETERIZATION N/A 05/16/2015   Procedure: Left Heart Cath and Coronary  Angiography;  Surgeon: [name], MD;  Location: [facility];  Service: Cardiovascular;  Laterality: N/A;  . CARDIAC CATHETERIZATION N/A 05/16/2015   Procedure: Coronary Balloon Angioplasty;  Surgeon: [name], MD;  Location: [facility];  Service: Cardiovascular;  Laterality: N/A;  . CARDIAC CATHETERIZATION N/A 05/17/2015   Procedure: Coronary Stent Intervention Rotoblater;  Surgeon: [name], MD;  Location: [facility];  Service: Cardiovascular;  Laterality: N/A;  . ELBOW SURGERY Left 2016   "had bone pieces removed"  . ESOPHAGEAL MANOMETRY N/A 12/30/2016   Procedure: ESOPHAGEAL MANOMETRY (  EM);  Surgeon: Irene Shipper, MD;  Location: [facility];  Service: Endoscopy;  Laterality: N/A;  . EXCISIONAL HEMORRHOIDECTOMY    . FEMORAL BYPASS  ~ 2010  . IR RADIOLOGIST EVAL & MGMT  12/17/2016  . IVC FILTER PLACEMENT ([place] [facility])  2010   "had blood clots in my back"  . NECK SURGERY  2014   2008-2014  . PERIPHERAL VASCULAR ATHERECTOMY  01/07/2018   Procedure: PERIPHERAL VASCULAR ATHERECTOMY;  Surgeon: Wellington Hampshire, MD;  Location: [facility];  Service: Cardiovascular;;  . PERIPHERAL VASCULAR BALLOON ANGIOPLASTY  01/07/2018   Procedure: PERIPHERAL VASCULAR BALLOON ANGIOPLASTY;  Surgeon: Wellington Hampshire, MD;  Location: [facility];  Service: Cardiovascular;;  . PERIPHERAL VASCULAR CATHETERIZATION N/A 02/14/2016   Procedure: Abdominal Aortogram w/Lower Extremity;  Surgeon: Wellington Hampshire, MD;  Location: [facility];  Service: Cardiovascular;  Laterality: N/A;  . POSTERIOR LAMINECTOMY / DECOMPRESSION LUMBAR SPINE  2006   "bone spurs"  . VIDEO BRONCHOSCOPY WITH ENDOBRONCHIAL NAVIGATION Right 02/25/2018   Procedure: VIDEO BRONCHOSCOPY WITH ENDOBRONCHIAL NAVIGATION;  Surgeon: Garner Nash, DO;  Location: [facility];  Service: Thoracic;  Laterality: Right;  [name] VIDEO BRONCHOSCOPY WITH ENDOBRONCHIAL ULTRASOUND Right 02/25/2018   Procedure: VIDEO  BRONCHOSCOPY WITH ENDOBRONCHIAL ULTRASOUND;  Surgeon: Garner Nash, DO;  Location: [facility];  Service: Thoracic;  Laterality: Right;    Social History   Socioeconomic History  . Marital status: Married    Spouse name: Not on file  . Number of children: Not on file  . Years of education: 7  . Highest education level: Not on file  Occupational History  . Occupation: Disabled    Employer: DISABLED  Social Needs  . Financial resource strain: Not on file  . Food insecurity    Worry: Not on file    Inability: Not on file  . Transportation needs    Medical: No    Non-medical: No  Tobacco Use  . Smoking status: Former Smoker    Packs/day: 0.75    Years: 49.00    Pack years: 36.75    Types: Cigarettes    Quit date: 03/11/2018    Years since quitting: 0.5  . Smokeless tobacco: Never Used  . Tobacco comment: half a pack a day 12/25/2017  Substance and Sexual Activity  . Alcohol use: Yes    Alcohol/week: 0.0 standard drinks    Comment: 2019 NO ALCOHOL IN 2 MONTHS  . Drug use: No  . Sexual activity: Not Currently  Lifestyle  . Physical activity    Days per week: Not on file    Minutes per session: Not on file  . Stress: Not on file  Relationships  . Social Herbalist on phone: Not on file    Gets together: Not on file    Attends religious service: Not on file    Active member of club or organization: Not on file    Attends meetings of clubs or organizations: Not on file    Relationship status: Not on file  . Intimate partner violence    Fear of current or ex partner: Not on file    Emotionally abused: Not on file    Physically abused: Not on file    Forced sexual activity: Not on file  Other Topics Concern  . Not on file  Social History Narrative   Regular exercise-no   Caffeine Use-yes   Lives with wife in a one story home. Has  2 children.  On disability for low back pain.  Used to work as a Games developer, lasted worked in 2007.     Education: 7th grade.      Allergies  Allergen Reactions  . Lisinopril Hives and Itching  . Amitriptyline Other (See Comments)    Makes pt feel "weird" and nausea.  . Contrast Media [Iodinated Diagnostic Agents] Hives and Itching    Pt can use if taking benadryl   . Ibuprofen Other (See Comments)    Internal bleeding     Outpatient Medications Prior to Visit  Medication Sig Dispense Refill  . albuterol (PROAIR HFA) 108 (90 Base) MCG/ACT inhaler USE 2 PUFFS EVERY 6 HOURS  AS NEEDED FOR WHEEZING OR  SHORTNESS OF BREATH 34 g 3  . albuterol (PROVENTIL) (2.5 MG/3ML) 0.083% nebulizer solution Take 3 mLs (2.5 mg total) by nebulization every 6 (six) hours as needed for wheezing or shortness of breath. 75 mL 5  . aspirin EC 81 MG tablet Take 81 mg by mouth daily.    [name] atorvastatin (LIPITOR) 40 MG tablet TAKE 1 TABLET BY MOUTH  DAILY AT 6 PM. 90 tablet 0  . Blood Glucose Monitoring Suppl (ONETOUCH VERIO) w/Device KIT 1 Device by Does not apply route once. 1 kit 0  . clopidogrel (PLAVIX) 75 MG tablet Take 1 tablet (75 mg total) by mouth daily. 90 tablet 3  . cyclobenzaprine (FLEXERIL) 10 MG tablet Take 1 tablet (10 mg total) by mouth 3 (three) times daily as needed for muscle spasms. 30 tablet 0  . fexofenadine (ALLEGRA ALLERGY) 180 MG tablet Take 1 tablet (180 mg total) by mouth daily. 90 tablet 3  . Fluticasone-Umeclidin-Vilant (TRELEGY ELLIPTA) 100-62.5-25 MCG/INH AEPB Inhale 1 puff into the lungs daily. 2 each 5  . losartan (COZAAR) 25 MG tablet Take 1 tablet (25 mg total) by mouth daily. 90 tablet 0  . nitroGLYCERIN (NITROSTAT) 0.4 MG SL tablet Place 1 tablet (0.4 mg total) under the tongue every 5 (five) minutes as needed for chest pain. 25 tablet 4  . Omega-3 Fatty Acids (FISH OIL) 1000 MG CAPS Take 1,000 mg by mouth daily.     [name] omeprazole (PRILOSEC) 40 MG capsule Take 1 capsule (40 mg total) by mouth daily. PATIENT NEEDS OFFICE VISIT FOR FURTHER REFILLS 90 capsule 0  . ONE TOUCH LANCETS MISC 1 each by Does not apply  route 3 (three) times daily. 200 each 5  . ONETOUCH VERIO test strip USE TO TEST 3 TIMES DAILY 100 each 0  . OVER THE COUNTER MEDICATION Apply 1 application topically daily as needed (neuropathy pain). Hemp oil cream    . traZODone (DESYREL) 100 MG tablet TAKE 1 TABLET BY MOUTH AT  BEDTIME 90 tablet 1  . diphenhydrAMINE (BENADRYL) 50 MG tablet Take 0.5 tablets (25 mg total) by mouth once. 1 hr prior to CT scan 30 tablet 0  . predniSONE (DELTASONE) 10 MG tablet Take 3 tabs on days 1-3, take 2 tabs on days 4-6, take 1 tab on days 7-9 (Patient not taking: Reported on 09/16/2018) 18 tablet 0   No facility-administered medications prior to visit.     Review of Systems  Constitutional: Negative for chills, fever, malaise/fatigue and weight loss.  HENT: Negative for hearing loss, sore throat and tinnitus.   Eyes: Negative for blurred vision and double vision.  Respiratory: Positive for cough, sputum production and shortness of breath. Negative for hemoptysis, wheezing and stridor.   Cardiovascular: Negative for chest pain, palpitations, orthopnea,  leg swelling and PND.  Gastrointestinal: Negative for abdominal pain, constipation, diarrhea, heartburn, nausea and vomiting.  Genitourinary: Negative for dysuria, hematuria and urgency.  Musculoskeletal: Negative for joint pain and myalgias.  Skin: Negative for itching and rash.  Neurological: Negative for dizziness, tingling, weakness and headaches.  Endo/Heme/Allergies: Negative for environmental allergies. Does not bruise/bleed easily.  Psychiatric/Behavioral: Negative for depression. The patient is not nervous/anxious and does not have insomnia.   All other systems reviewed and are negative.    Objective:  Physical Exam Vitals signs reviewed.  Constitutional:      General: He is not in acute distress.    Appearance: He is well-developed.  HENT:     Head: Normocephalic and atraumatic.  Eyes:     General: No scleral icterus.     Conjunctiva/sclera: Conjunctivae normal.     Pupils: Pupils are equal, round, and reactive to light.  Neck:     Musculoskeletal: Neck supple.     Vascular: No JVD.     Trachea: No tracheal deviation.  Cardiovascular:     Rate and Rhythm: Normal rate and regular rhythm.     Heart sounds: Normal heart sounds. No murmur.  Pulmonary:     Effort: Pulmonary effort is normal. No tachypnea, accessory muscle usage or respiratory distress.     Breath sounds: No stridor. Rhonchi present. No wheezing or rales.  Abdominal:     General: Bowel sounds are normal. There is no distension.     Palpations: Abdomen is soft.     Tenderness: There is no abdominal tenderness.  Musculoskeletal:        General: Tenderness present.     Comments: Chronic PVD changes   Lymphadenopathy:     Cervical: No cervical adenopathy.  Skin:    General: Skin is warm and dry.     Capillary Refill: Capillary refill takes less than 2 seconds.     Findings: No rash.  Neurological:     Mental Status: He is alert and oriented to person, place, and time.  Psychiatric:        Behavior: Behavior normal.      Vitals:   09/16/18 1012  BP: 140/90  Pulse: 80  SpO2: 98%  Weight: 219 lb 3.2 oz (99.4 kg)  Height: '5\' 9"'  (1.753 m)   98% on RA BMI Readings from Last 3 Encounters:  09/16/18 32.37 kg/m  04/02/18 32.10 kg/m  04/01/18 31.75 kg/m   Wt Readings from Last 3 Encounters:  09/16/18 219 lb 3.2 oz (99.4 kg)  04/02/18 217 lb 6.4 oz (98.6 kg)  04/01/18 215 lb (97.5 kg)     CBC    Component Value Date/Time   WBC 8.7 07/09/2018 1610   RBC 5.37 07/09/2018 1610   HGB 17.5 (H) 07/09/2018 1610   HGB 16.4 01/02/2018 0927   HGB 19.2 (H) 11/25/2016 1430   HCT 50.5 07/09/2018 1610   HCT 46.2 01/02/2018 0927   HCT 54.8 (H) 11/25/2016 1430   PLT 180 07/09/2018 1610   PLT 269 01/02/2018 0927   MCV 94.0 07/09/2018 1610   MCV 97 01/02/2018 0927   MCV 92.3 11/25/2016 1430   MCH 32.6 07/09/2018 1610   MCHC 34.7  07/09/2018 1610   RDW 14.7 07/09/2018 1610   RDW 13.9 01/02/2018 0927   RDW 15.0 (H) 11/25/2016 1430   LYMPHSABS 2.1 07/09/2018 1610   LYMPHSABS 2.5 11/25/2016 1430   MONOABS 1.0 07/09/2018 1610   MONOABS 1.3 (H) 11/25/2016 1430   EOSABS 0.3 07/09/2018  1610   EOSABS 0.3 11/25/2016 1430   BASOSABS 0.1 07/09/2018 1610   BASOSABS 0.0 11/25/2016 1430    Chest Imaging: 2018 CT chest: Right lower lobe 6 mm pulmonary nodule stable from 2014 2018 on prior imaging.  02/16/2018 super D CT chest: Right upper lobe cystic lesion. The patient's images have been independently reviewed by me.    Pulmonary Functions Testing Results:  PFT Results Latest Ref Rng & Units 02/05/2018  FVC-Pre L 3.05  FVC-Predicted Pre % 66  FVC-Post L 3.30  FVC-Predicted Post % 72  Pre FEV1/FVC % % 58  Post FEV1/FCV % % 63  FEV1-Pre L 1.75  FEV1-Predicted Pre % 50  FEV1-Post L 2.09  DLCO UNC% % 75  DLCO COR %Predicted % 82  TLC L 7.59  TLC % Predicted % 111  RV % Predicted % 173     FeNO: None  Pathology:   Bronchoscopy: 02/25/2018 ENB/Radial EBUS  + RUL NSCLC, Adenocarcinoma   Echocardiogram: None   Heart Catheterization:   Prox LAD to Mid LAD lesion, 90% stenosed. Post intervention, there remains 80% residual calcific stenosis.  Mid LAD lesion, 80% stenosed. Dist LAD lesion, 55% stenosed.  Prox Cx lesion, 100% stenosed. Prox Cx to Mid Cx lesion, 99% stenosed. Ost 2nd Mrg to 2nd Mrg lesion, 99% stenosed. -- Seen via retrograde filling from LAD/diagonal-OM collaterals  There is hyperdynamic left ventricular systolic function.  Elevated LVEDP    Assessment & Plan:      ICD-10-CM   1. Moderate COPD (chronic obstructive pulmonary disease) (HCC)  J44.9   2. Malignant neoplasm of upper lobe of right lung (HCC)  C34.11   3. 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  Longtime smoker but has recently quit.  Ultimately underwent radiation therapy for treatment of his upper lobe cancer.  From a pulmonary standpoint he is stable.  Patient is currently maintained on as needed albuterol and Trelegy inhaler.  He can continue these for the time being.  Refills today as needed for medications.  Greater than 10 minutes of today's office was spent on smoking cessation counseling.  We also discussed the risk benefits and alternatives of using medications to help quit.  He is not interested at this time and using Chantix.  He would like to think about it and call us back if he would like a prescription to try for this.  In the meantime he should use patches and lozenges as he has used in the past.  He believes he can quit and stay quit if he "puts his mind to it".  Can return to clinic in 12 months or as needed with PFTs.  Greater than 50% of this patient's 25-minute office visit was spent face-to-face discussing above recommendations and treatment plan as outlined above.  We also discussed the risk benefits of continuing smoking as detailed.   Current Outpatient Medications:  .  albuterol (PROAIR HFA) 108 (90 Base) MCG/ACT inhaler, USE 2 PUFFS EVERY 6 HOURS  AS NEEDED FOR WHEEZING OR  SHORTNESS OF BREATH, Disp: 34 g, Rfl: 3 .  albuterol (PROVENTIL) (2.5 MG/3ML) 0.083% nebulizer solution, Take 3 mLs (2.5 mg total) by nebulization every 6 (six) hours as needed for wheezing or shortness of breath., Disp: 75 mL, Rfl: 5 .  aspirin EC 81 MG tablet, Take 81 mg by mouth daily., Disp: ,  Rfl:  .  atorvastatin (LIPITOR) 40 MG tablet, TAKE 1 TABLET BY MOUTH  DAILY AT 6 PM., Disp: 90 tablet, Rfl: 0 .  Blood Glucose Monitoring Suppl (ONETOUCH VERIO) w/Device KIT, 1 Device by Does not apply route once., Disp: 1 kit, Rfl: 0 .  clopidogrel (PLAVIX) 75 MG tablet, Take 1 tablet (75 mg total) by mouth daily., Disp: 90 tablet, Rfl: 3 .  cyclobenzaprine (FLEXERIL) 10 MG tablet,  Take 1 tablet (10 mg total) by mouth 3 (three) times daily as needed for muscle spasms., Disp: 30 tablet, Rfl: 0 .  fexofenadine (ALLEGRA ALLERGY) 180 MG tablet, Take 1 tablet (180 mg total) by mouth daily., Disp: 90 tablet, Rfl: 3 .  Fluticasone-Umeclidin-Vilant (TRELEGY ELLIPTA) 100-62.5-25 MCG/INH AEPB, Inhale 1 puff into the lungs daily., Disp: 2 each, Rfl: 5 .  losartan (COZAAR) 25 MG tablet, Take 1 tablet (25 mg total) by mouth daily., Disp: 90 tablet, Rfl: 0 .  nitroGLYCERIN (NITROSTAT) 0.4 MG SL tablet, Place 1 tablet (0.4 mg total) under the tongue every 5 (five) minutes as needed for chest pain., Disp: 25 tablet, Rfl: 4 .  Omega-3 Fatty Acids (FISH OIL) 1000 MG CAPS, Take 1,000 mg by mouth daily. , Disp: , Rfl:  .  omeprazole (PRILOSEC) 40 MG capsule, Take 1 capsule (40 mg total) by mouth daily. PATIENT NEEDS OFFICE VISIT FOR FURTHER REFILLS, Disp: 90 capsule, Rfl: 0 .  ONE TOUCH LANCETS MISC, 1 each by Does not apply route 3 (three) times daily., Disp: 200 each, Rfl: 5 .  ONETOUCH VERIO test strip, USE TO TEST 3 TIMES DAILY, Disp: 100 each, Rfl: 0 .  OVER THE COUNTER MEDICATION, Apply 1 application topically daily as needed (neuropathy pain). Hemp oil cream, Disp: , Rfl:  .  traZODone (DESYREL) 100 MG tablet, TAKE 1 TABLET BY MOUTH AT  BEDTIME, Disp: 90 tablet, Rfl: 1 .  diphenhydrAMINE (BENADRYL) 50 MG tablet, Take 0.5 tablets (25 mg total) by mouth once. 1 hr prior to CT scan, Disp: 30 tablet, Rfl: 0   Garner Nash, DO [facility] 09/16/2018 10:27 AM ]

## 2018-09-16 NOTE — Patient Instructions (Addendum)
Thank you for visiting Dr. Valeta Harms at [facility].  Today we recommend the following: Orders Placed This Encounter  Procedures  . Pulmonary Function Test   Meds ordered this encounter  Medications  . albuterol (PROVENTIL) (2.5 MG/3ML) 0.083% nebulizer solution    Sig: Take 3 mLs (2.5 mg total) by nebulization every 6 (six) hours as needed for wheezing or shortness of breath.    Dispense:  75 mL    Refill:  5  . albuterol (PROAIR HFA) 108 (90 Base) MCG/ACT inhaler    Sig: USE 2 PUFFS EVERY 6 HOURS  AS NEEDED FOR WHEEZING OR  SHORTNESS OF BREATH    Dispense:  34 g    Refill:  3  . Fluticasone-Umeclidin-Vilant (TRELEGY ELLIPTA) 100-62.5-25 MCG/INH AEPB    Sig: Inhale 1 puff into the lungs daily.    Dispense:  2 each    Refill:  5    Order Specific Question:   Lot Number?    Answer:   [id number]    Order Specific Question:   Expiration Date?    Answer:   05/09/2019    Order Specific Question:   Manufacturer?    Answer:   [employer] [[id number]]    Order Specific Question:   Quantity    Answer:   1   Return in about 1 year (around 09/16/2019).   We will have full PFTS completed at next visit.  We call us if you would like help quiting smoking.  Please start the tobacco cessation program you have purchase. Ok to use lozenges and nicotine supplements and patches.

## 2018-09-18 ENCOUNTER — Other Ambulatory Visit: Payer: Self-pay | Admitting: Internal Medicine

## 2018-09-24 ENCOUNTER — Other Ambulatory Visit: Payer: Self-pay | Admitting: Internal Medicine

## 2018-09-28 ENCOUNTER — Telehealth: Payer: Self-pay | Admitting: *Deleted

## 2018-09-28 NOTE — Telephone Encounter (Signed)
A message was left, re: follow up visit. 

## 2018-09-30 DIAGNOSIS — J449 Chronic obstructive pulmonary disease, unspecified: Secondary | ICD-10-CM | POA: Diagnosis not present

## 2018-10-15 ENCOUNTER — Other Ambulatory Visit: Payer: Self-pay | Admitting: *Deleted

## 2018-10-15 ENCOUNTER — Other Ambulatory Visit: Payer: Self-pay | Admitting: Cardiovascular Disease

## 2018-10-15 DIAGNOSIS — I739 Peripheral vascular disease, unspecified: Secondary | ICD-10-CM

## 2018-10-15 DIAGNOSIS — I10 Essential (primary) hypertension: Secondary | ICD-10-CM

## 2018-10-15 DIAGNOSIS — Z79899 Other long term (current) drug therapy: Secondary | ICD-10-CM

## 2018-10-16 ENCOUNTER — Other Ambulatory Visit: Payer: Self-pay | Admitting: Internal Medicine

## 2018-11-05 ENCOUNTER — Other Ambulatory Visit: Payer: Self-pay | Admitting: Internal Medicine

## 2018-11-05 ENCOUNTER — Other Ambulatory Visit: Payer: Self-pay | Admitting: Cardiovascular Disease

## 2018-11-05 DIAGNOSIS — K269 Duodenal ulcer, unspecified as acute or chronic, without hemorrhage or perforation: Secondary | ICD-10-CM

## 2018-11-05 DIAGNOSIS — K253 Acute gastric ulcer without hemorrhage or perforation: Secondary | ICD-10-CM

## 2018-11-06 NOTE — Telephone Encounter (Signed)
Patient needs OV for additional refills 

## 2018-11-06 NOTE — Telephone Encounter (Signed)
Please review for refill on amlodipine. Thanks!

## 2018-11-22 ENCOUNTER — Other Ambulatory Visit: Payer: Self-pay | Admitting: Internal Medicine

## 2018-12-24 ENCOUNTER — Other Ambulatory Visit: Payer: Self-pay | Admitting: Cardiovascular Disease

## 2018-12-24 ENCOUNTER — Other Ambulatory Visit: Payer: Self-pay | Admitting: Internal Medicine

## 2018-12-24 DIAGNOSIS — I10 Essential (primary) hypertension: Secondary | ICD-10-CM

## 2018-12-24 DIAGNOSIS — Z79899 Other long term (current) drug therapy: Secondary | ICD-10-CM

## 2018-12-24 DIAGNOSIS — I739 Peripheral vascular disease, unspecified: Secondary | ICD-10-CM

## 2018-12-24 NOTE — Telephone Encounter (Signed)
Rx request sent to pharmacy.  

## 2018-12-24 NOTE — Telephone Encounter (Signed)
Please review for refill. Thanks!  

## 2018-12-25 ENCOUNTER — Other Ambulatory Visit: Payer: Self-pay | Admitting: Internal Medicine

## 2018-12-25 DIAGNOSIS — K253 Acute gastric ulcer without hemorrhage or perforation: Secondary | ICD-10-CM

## 2018-12-25 DIAGNOSIS — K269 Duodenal ulcer, unspecified as acute or chronic, without hemorrhage or perforation: Secondary | ICD-10-CM

## 2019-01-02 ENCOUNTER — Telehealth: Payer: Self-pay | Admitting: Internal Medicine

## 2019-01-27 ENCOUNTER — Telehealth: Payer: Self-pay | Admitting: *Deleted

## 2019-01-27 NOTE — Telephone Encounter (Signed)
CALLED PATIENT TO INFORM OF CT FOR 02-04-19 - ARRIVAL TIME- 2:40 PM @ [facility] NO RESTRICTIONS TO TEST, AND TELEPHONE FU WITH ALISON PERKINS ON 02-15-19 @ 1:30 PM FOR RESULTS, LVM FOR A RETURN CALL

## 2019-02-04 ENCOUNTER — Other Ambulatory Visit: Payer: Self-pay

## 2019-02-04 ENCOUNTER — Ambulatory Visit
Admission: RE | Admit: 2019-02-04 | Discharge: 2019-02-04 | Disposition: A | Discharge status: Home / Self Care | Payer: Medicare Other | Source: Ambulatory Visit | Attending: Radiation Oncology | Admitting: Radiation Oncology

## 2019-02-04 DIAGNOSIS — C3411 Malignant neoplasm of upper lobe, right bronchus or lung: Secondary | ICD-10-CM

## 2019-02-04 DIAGNOSIS — C3490 Malignant neoplasm of unspecified part of unspecified bronchus or lung: Secondary | ICD-10-CM | POA: Diagnosis not present

## 2019-02-05 ENCOUNTER — Telehealth: Payer: Self-pay | Admitting: Radiation Oncology

## 2019-02-05 DIAGNOSIS — C3411 Malignant neoplasm of upper lobe, right bronchus or lung: Secondary | ICD-10-CM

## 2019-02-05 NOTE — Telephone Encounter (Signed)
  Radiation Oncology         [phone]) [phone] ________________________________  Name: Joshua [name] MRN: [id number]  Date of Service: 02/05/2019  DOB: 05-30-1955  Follow Up Telephone Note  Diagnosis:   Stage IA3, cT1cN0M0 NSCLC, adenocarcinoma of the RUL  Interval Since Last Radiation:  9 months  04/14/18-04/21/2018 SBRT Treatment: The RUL target was treated to 54 Gy in 3 fractions  Narrative:  The patient was contacted today for routine follow-up.   Impression/Plan: 1. Stage IA3, cT1cN0M0 NSCLC, adenocarcinoma of the RUL. The patient has been doing well since completion of radiotherapy from our prior encounters. I left a message for him to let him know his CT scan results.  I discussed that I would plan to repeat a CT scan in 6 months. He was asked to call back if he would like to discuss this further but we will cancel his appointment on 02/15/2019.    Carola Rhine, PAC

## 2019-02-15 ENCOUNTER — Ambulatory Visit: Payer: Self-pay | Admitting: Radiation Oncology

## 2019-04-06 ENCOUNTER — Other Ambulatory Visit: Payer: Self-pay

## 2019-04-06 MED ORDER — ONETOUCH ULTRA VI STRP: ORAL_STRIP | 0 refills | Status: DC

## 2019-04-06 NOTE — Telephone Encounter (Signed)
Rx sent through [facility]  

## 2019-04-06 NOTE — Telephone Encounter (Signed)
Patient called today in regards to this refill requested  He stated that the pharmacy never received the refill and would like it resent

## 2019-05-24 ENCOUNTER — Telehealth: Payer: Self-pay

## 2019-05-24 ENCOUNTER — Other Ambulatory Visit: Payer: Self-pay | Admitting: Internal Medicine

## 2019-05-24 ENCOUNTER — Other Ambulatory Visit: Payer: Self-pay | Admitting: Cardiovascular Disease

## 2019-05-24 DIAGNOSIS — Z79899 Other long term (current) drug therapy: Secondary | ICD-10-CM

## 2019-05-24 DIAGNOSIS — K269 Duodenal ulcer, unspecified as acute or chronic, without hemorrhage or perforation: Secondary | ICD-10-CM

## 2019-05-24 DIAGNOSIS — I10 Essential (primary) hypertension: Secondary | ICD-10-CM

## 2019-05-24 DIAGNOSIS — K253 Acute gastric ulcer without hemorrhage or perforation: Secondary | ICD-10-CM

## 2019-05-24 DIAGNOSIS — I739 Peripheral vascular disease, unspecified: Secondary | ICD-10-CM

## 2019-05-24 NOTE — Telephone Encounter (Signed)
Attempted to schedule.  LMOV to call office.  ° °

## 2019-05-24 NOTE — Telephone Encounter (Signed)
-----   Message from Anselm Pancoast, [place] sent at 05/24/2019  8:44 AM EST ----- Please contact patient for a follow up with Dr. Fletcher [name].  The patient was last seen in 2019.  Thanks, Ivin Booty

## 2019-05-25 NOTE — Telephone Encounter (Signed)
Overdue CPE letter mailed 

## 2019-05-28 NOTE — Telephone Encounter (Signed)
Attempted to schedule no ans no vm  

## 2019-06-01 NOTE — Telephone Encounter (Signed)
Attempted to schedule no ans no vm   3 attempts closing encounter

## 2019-06-07 ENCOUNTER — Other Ambulatory Visit: Payer: Self-pay | Admitting: Internal Medicine

## 2019-07-08 ENCOUNTER — Telehealth: Payer: Self-pay | Admitting: Internal Medicine

## 2019-07-08 NOTE — Telephone Encounter (Signed)
Ok to come in. Will discuss at appt tomorrow.

## 2019-07-08 NOTE — Telephone Encounter (Signed)
Patient's wife called today  She stated the patient has an appointment tomorrow for his physical.  She wanted to call because of the problems he has going on that he may not mention at the appointment   They are concerned because the patient continues to have trouble with his stomach  Patient's wife stated that a while back when the patient was in he was told that he may have a hernia. several months he has been having some stomach problems that they think may be related to the possible hernia but are not sure.    She also stated that for several months he has been having a runny nose.  They have tried several allergy medications and nothing is helping.  Wife stated he is not having any other symptoms beside the running nose and stomach.   What do you suggest? Is he okay for the appointment tomorrow?

## 2019-07-09 ENCOUNTER — Ambulatory Visit (INDEPENDENT_AMBULATORY_CARE_PROVIDER_SITE_OTHER): Payer: Medicare Other | Admitting: Internal Medicine

## 2019-07-09 ENCOUNTER — Other Ambulatory Visit: Payer: Self-pay

## 2019-07-09 ENCOUNTER — Encounter: Payer: Self-pay | Admitting: Internal Medicine

## 2019-07-09 VITALS — BP 138/86 | HR 77 | Temp 98.5°F | Ht 69.0 in | Wt 191.0 lb

## 2019-07-09 DIAGNOSIS — E1142 Type 2 diabetes mellitus with diabetic polyneuropathy: Secondary | ICD-10-CM | POA: Diagnosis not present

## 2019-07-09 DIAGNOSIS — M1A00X0 Idiopathic chronic gout, unspecified site, without tophus (tophi): Secondary | ICD-10-CM | POA: Diagnosis not present

## 2019-07-09 DIAGNOSIS — M1990 Unspecified osteoarthritis, unspecified site: Secondary | ICD-10-CM

## 2019-07-09 DIAGNOSIS — I10 Essential (primary) hypertension: Secondary | ICD-10-CM

## 2019-07-09 DIAGNOSIS — I2510 Atherosclerotic heart disease of native coronary artery without angina pectoris: Secondary | ICD-10-CM

## 2019-07-09 DIAGNOSIS — Z0000 Encounter for general adult medical examination without abnormal findings: Secondary | ICD-10-CM

## 2019-07-09 DIAGNOSIS — F5101 Primary insomnia: Secondary | ICD-10-CM

## 2019-07-09 DIAGNOSIS — R972 Elevated prostate specific antigen [PSA]: Secondary | ICD-10-CM

## 2019-07-09 DIAGNOSIS — E782 Mixed hyperlipidemia: Secondary | ICD-10-CM

## 2019-07-09 DIAGNOSIS — Z125 Encounter for screening for malignant neoplasm of prostate: Secondary | ICD-10-CM | POA: Diagnosis not present

## 2019-07-09 DIAGNOSIS — C3411 Malignant neoplasm of upper lobe, right bronchus or lung: Secondary | ICD-10-CM | POA: Diagnosis not present

## 2019-07-09 DIAGNOSIS — K219 Gastro-esophageal reflux disease without esophagitis: Secondary | ICD-10-CM

## 2019-07-09 DIAGNOSIS — I739 Peripheral vascular disease, unspecified: Secondary | ICD-10-CM

## 2019-07-09 DIAGNOSIS — J449 Chronic obstructive pulmonary disease, unspecified: Secondary | ICD-10-CM

## 2019-07-09 NOTE — Assessment & Plan Note (Signed)
Continue Colchicine prn Will monitor

## 2019-07-09 NOTE — Assessment & Plan Note (Signed)
Currently not an issue Will monitor 

## 2019-07-09 NOTE — Patient Instructions (Signed)

## 2019-07-09 NOTE — Assessment & Plan Note (Signed)
A1C today No microalbumin secondary to ARB therapy Encouraged him to consume a low carb diet Encouraged yearly eye exam Foot exam today Will get flu and pneumovax in the fall

## 2019-07-09 NOTE — Assessment & Plan Note (Signed)
Continue Omeprazole CBC and CMET today 

## 2019-07-09 NOTE — Progress Notes (Signed)
HPI:  Pt presents to the clinic today for his annual subsequent Medicare Wellness Exam. He is also due to follow up chronic conditions.  OA: Mainly in his back. He does not take any medications for this.  HLD with CAD: His last LDL was.145, 06/2018 He denies angina. He is taking Fish Oil, Atorvastatin and Plavix as prescribed. He does not consume a low fat diet.  COPD: He denies chronic cough or SOB. He does still smoke. He uses Trelegy as prescribed and Albuterol as needed. PFT's from 09/2018 reviewed.  GERD: He denies breakthrough on Omeprazole. Upper GI from 12/2016 reviewed.  Gout: He denies recent flare. He takes Colchicine as needed with good relief of symptoms.  HTN: His BP today is 138/86. He is taking Losartan as prescribed. ECG from 10/2017 reviewed.  PAD/PVD: Managed on Atorvastatin and Plavix. He does still smoke. He follows with cardiology.  DM 2: His last A1C was 5.6%, 06/2018. He is not taking any oral diabetic medication at this time. He does not check his sugars. He checks his feet routinely.  Insomnia: He has difficulty staying asleep. He takes Trazadone as needed with some relief. Sleep study from 04/2015 reviewed.  Lung CA: He underwent radiation. He follows with Dr. Lisbeth Renshaw.  Past Medical History:  Diagnosis Date  . AC (acromioclavicular) joint bone spurs, unspecified laterality 11/25/2016  . Arthritis    "back" (05/16/2015)  . CAD in native artery, occluded Lcx and rotational atherectomy to LAD with DES 05/17/15 05/18/2015  . Cavitary lesion of lung 02/25/2018  . Chronic back pain    "all my back"  . Chronic bronchitis ([place])   . COPD (chronic obstructive pulmonary disease) ([place])   . Diabetes mellitus without complication ([place])   . GERD (gastroesophageal reflux disease)   . H/O blood clots    "had them in my back; put filter in before one of my neck ORs"  . History of gout   . Hyperlipidemia   . Hypertension   . Neuromuscular disorder ([place])   . PAD (peripheral  artery disease) ([place])   . Phlebitis   . Positive TB test   . Seasonal allergies   . Shortness of breath   . Spinal disease     Current Outpatient Medications  Medication Sig Dispense Refill  . albuterol (PROAIR HFA) 108 (90 Base) MCG/ACT inhaler USE 2 PUFFS EVERY 6 HOURS&nbsp;&nbsp;AS NEEDED FOR WHEEZING OR&nbsp;&nbsp;SHORTNESS OF BREATH 34 g 3  . albuterol (PROVENTIL) (2.5 MG/3ML) 0.083% nebulizer solution Take 3 mLs (2.5 mg total) by nebulization every 6 (six) hours as needed for wheezing or shortness of breath. 75 mL 5  . aspirin EC 81 MG tablet Take 81 mg by mouth daily.    [name] atorvastatin (LIPITOR) 40 MG tablet TAKE 1 TABLET BY MOUTH  DAILY AT 6 PM 90 tablet 0  . Blood Glucose Monitoring Suppl (ONETOUCH VERIO) w/Device KIT 1 Device by Does not apply route once. 1 kit 0  . clopidogrel (PLAVIX) 75 MG tablet TAKE 1 TABLET BY MOUTH  DAILY 90 tablet 0  . cyclobenzaprine (FLEXERIL) 10 MG tablet Take 1 tablet (10 mg total) by mouth 3 (three) times daily as needed for muscle spasms. 30 tablet 0  . diphenhydrAMINE (BENADRYL) 50 MG tablet Take 0.5 tablets (25 mg total) by mouth once. 1 hr prior to CT scan 30 tablet 0  . fexofenadine (ALLEGRA ALLERGY) 180 MG tablet Take 1 tablet (180 mg total) by mouth daily. 90 tablet 3  . Fluticasone-Umeclidin-Vilant (TRELEGY ELLIPTA)  100-62.5-25 MCG/INH AEPB Inhale 1 puff into the lungs daily. 2 each 5  . losartan (COZAAR) 25 MG tablet TAKE 1 TABLET BY MOUTH  DAILY 90 tablet 0  . nitroGLYCERIN (NITROSTAT) 0.4 MG SL tablet Place 1 tablet (0.4 mg total) under the tongue every 5 (five) minutes as needed for chest pain. 25 tablet 4  . Omega-3 Fatty Acids (FISH OIL) 1000 MG CAPS Take 1,000 mg by mouth daily.     [name] omeprazole (PRILOSEC) 40 MG capsule Take 1 capsule (40 mg total) by mouth daily. Patient needs office visit for further refills!! 90 capsule 0  . ONE TOUCH LANCETS MISC 1 each by Does not apply route 3 (three) times daily. 200 each 5  . ONETOUCH ULTRA test  strip USE TO TEST 3 TIMES DAILY 300 strip 3  . OVER THE COUNTER MEDICATION Apply 1 application topically daily as needed (neuropathy pain). Hemp oil cream    . traZODone (DESYREL) 100 MG tablet TAKE 1 TABLET BY MOUTH AT  BEDTIME 90 tablet 0   No current facility-administered medications for this visit.    Allergies  Allergen Reactions  . Lisinopril Hives and Itching  . Amitriptyline Other (See Comments)    Makes pt feel "weird" and nausea.  . Contrast Media [Iodinated Diagnostic Agents] Hives and Itching    Pt can use if taking benadryl   . Ibuprofen Other (See Comments)    Internal bleeding    Family History  Problem Relation Age of Onset  . Stomach cancer Mother        Deceased, 33s  . Hypertension Mother   . Hypertension Father   . Stroke Father   . Heart attack Father        Deceased, 22  . Aneurysm Brother   . Healthy Daughter   . Healthy Maternal Grandmother   . Leukemia Grandchild   . Diabetes Neg Hx   . Early death Neg Hx   . Colon cancer Neg Hx   . Pancreatic cancer Neg Hx     Social History   Socioeconomic History  . Marital status: Married    Spouse name: Not on file  . Number of children: Not on file  . Years of education: 7  . Highest education level: Not on file  Occupational History  . Occupation: Disabled    Employer: DISABLED  Tobacco Use  . Smoking status: Former Smoker    Packs/day: 0.75    Years: 49.00    Pack years: 36.75    Types: Cigarettes    Quit date: 03/11/2018    Years since quitting: 1.3  . Smokeless tobacco: Never Used  . Tobacco comment: half a pack a day 12/25/2017  Substance and Sexual Activity  . Alcohol use: Yes    Alcohol/week: 0.0 standard drinks    Comment: 2019 NO ALCOHOL IN 2 MONTHS  . Drug use: No  . Sexual activity: Not Currently  Other Topics Concern  . Not on file  Social History Narrative   Regular exercise-no   Caffeine Use-yes   Lives with wife in a one story home. Has 2 children.  On disability for  low back pain.  Used to work as a Games developer, lasted worked in 2007.     Education: 7th grade.   Social Determinants of Health   Financial Resource Strain:   . Difficulty of Paying Living Expenses:   Food Insecurity:   . Worried About Charity fundraiser in the Last Year:   .  Ran Out of Food in the Last Year:   Transportation Needs:   . Film/video editor (Medical):   [name] Lack of Transportation (Non-Medical):   Physical Activity:   . Days of Exercise per Week:   . Minutes of Exercise per Session:   Stress:   . Feeling of Stress :   Social Connections:   . Frequency of Communication with Friends and Family:   . Frequency of Social Gatherings with Friends and Family:   . Attends Religious Services:   . Active Member of Clubs or Organizations:   . Attends Archivist Meetings:   [name] Marital Status:   Intimate Partner Violence:   . Fear of Current or Ex-Partner:   . Emotionally Abused:   [name] Physically Abused:   . Sexually Abused:     Hospitiliaztions: None  Health Maintenance:    Flu: 11/2018  Tetanus: 08/2012  Pneumovax: 02/2015  Zostavax: never  Shingrix: never  Colon Screening: 08/2008  Eye Doctor: as needed  Dental Exam: as needed   Providers:   PCP: Webb Silversmith, NP  Cardiologist: Dr. Fletcher [name]  Gastroenterologist: Dr. Henrene Pastor  Pulmonologist: Dr. Valeta Harms  Oncologist: Dr. Lisbeth Renshaw   I have personally reviewed and have noted:  1. The patient's medical and social history 2. Their use of alcohol, tobacco or illicit drugs 3. Their current medications and supplements 4. The patient's functional ability including ADL's, fall risks, home safety risks and hearing or visual impairment. 5. Diet and physical activities 6. Evidence for depression or mood disorder  Subjective:   Review of Systems:   Constitutional: Pt reports intentional weight loss. Denies fever, malaise, fatigue, headache.  HEENT: Denies eye pain, eye redness, ear pain, ringing in the ears, wax  buildup, runny nose, nasal congestion, bloody nose, or sore throat. Respiratory: Denies difficulty breathing, shortness of breath, cough or sputum production.   Cardiovascular: Denies chest pain, chest tightness, palpitations or swelling in the hands or feet.  Gastrointestinal: Denies abdominal pain, bloating, constipation, diarrhea or blood in the stool.  GU: Denies urgency, frequency, pain with urination, burning sensation, blood in urine, odor or discharge. Musculoskeletal: Pt reports intermittent back pain. Denies decrease in range of motion, difficulty with gait, muscle pain or joint pain and swelling.  Skin: Denies redness, rashes, lesions or ulcercations.  Neurological: Pt reports neuropathic pain in feet. Denies dizziness, difficulty with memory, difficulty with speech or problems with balance and coordination.  Psych: Denies anxiety, depression, SI/HI.  No other specific complaints in a complete review of systems (except as listed in HPI above).  Objective:  PE:   BP 138/86   Pulse 77   Temp 98.5 F (36.9 C) (Temporal)   Ht '5\' 9"'$  (1.753 m)   Wt 191 lb (86.6 kg)   SpO2 97%   BMI 28.21 kg/m   Wt Readings from Last 3 Encounters:  09/16/18 219 lb 3.2 oz (99.4 kg)  04/02/18 217 lb 6.4 oz (98.6 kg)  04/01/18 215 lb (97.5 kg)    General: Appears his stated age, well developed, well nourished in NAD. Skin: Warm, dry and intact. No ulcerations noted. Lower extremities cool to touch. HEENT: Head: normal shape and size; Eyes: sclera white, no icterus, conjunctiva pink, PERRLA and EOMs intact;  Neck: Neck supple, trachea midline. No masses, lumps or thyromegaly present.  Cardiovascular: Normal rate and rhythm. S1,S2 noted.  No murmur, rubs or gallops noted. No JVD or BLE edema. No carotid bruits noted. Pulmonary/Chest: Normal effort and coarse breath sounds with  intermittent wheezing. No respiratory distress. No rales or ronchi noted.  Abdomen: Soft and nontender. Normal bowel  sounds. No distention or masses noted. Liver, spleen and kidneys non palpable. Musculoskeletal: Strength 5/5 BUE/BLE. No signs of joint swelling.  Neurological: Alert and oriented. Cranial nerves II-XII grossly intact. Coordination normal.  Psychiatric: Mood and affect normal. Behavior is normal. Judgment and thought content normal.     BMET    Component Value Date/Time   NA 139 07/09/2018 1610   NA 139 01/02/2018 0927   NA 140 11/25/2016 1430   K 3.5 07/09/2018 1610   K 4.3 11/25/2016 1430   CL 106 07/09/2018 1610   CO2 21 (L) 07/09/2018 1610   CO2 27 11/25/2016 1430   GLUCOSE 111 (H) 07/09/2018 1610   GLUCOSE 101 11/25/2016 1430   BUN 10 07/09/2018 1610   BUN 12 01/02/2018 0927   BUN 12.6 11/25/2016 1430   CREATININE 0.97 07/09/2018 1610   CREATININE 0.9 11/25/2016 1430   CALCIUM 8.5 (L) 07/09/2018 1610   CALCIUM 10.3 11/25/2016 1430   GFRNONAA >60 07/09/2018 1610   GFRAA >60 07/09/2018 1610    Lipid Panel     Component Value Date/Time   CHOL 230 (H) 07/09/2018 1040   CHOL 215 (H) 11/21/2017 0810   TRIG 185.0 (H) 07/09/2018 1040   HDL 48.50 07/09/2018 1040   HDL 126 11/21/2017 0810   CHOLHDL 5 07/09/2018 1040   VLDL 37.0 07/09/2018 1040   LDLCALC 145 (H) 07/09/2018 1040   LDLCALC 70 11/21/2017 0810    CBC    Component Value Date/Time   WBC 8.7 07/09/2018 1610   RBC 5.37 07/09/2018 1610   HGB 17.5 (H) 07/09/2018 1610   HGB 16.4 01/02/2018 0927   HGB 19.2 (H) 11/25/2016 1430   HCT 50.5 07/09/2018 1610   HCT 46.2 01/02/2018 0927   HCT 54.8 (H) 11/25/2016 1430   PLT 180 07/09/2018 1610   PLT 269 01/02/2018 0927   MCV 94.0 07/09/2018 1610   MCV 97 01/02/2018 0927   MCV 92.3 11/25/2016 1430   MCH 32.6 07/09/2018 1610   MCHC 34.7 07/09/2018 1610   RDW 14.7 07/09/2018 1610   RDW 13.9 01/02/2018 0927   RDW 15.0 (H) 11/25/2016 1430   LYMPHSABS 2.1 07/09/2018 1610   LYMPHSABS 2.5 11/25/2016 1430   MONOABS 1.0 07/09/2018 1610   MONOABS 1.3 (H) 11/25/2016  1430   EOSABS 0.3 07/09/2018 1610   EOSABS 0.3 11/25/2016 1430   BASOSABS 0.1 07/09/2018 1610   BASOSABS 0.0 11/25/2016 1430    Hgb A1C Lab Results  Component Value Date   HGBA1C 5.6 07/09/2018      Assessment and Plan:   Medicare Annual Wellness Visit:  Diet: He does not eat meat. He consumes lots of fruits and veggies. He does eat some fried foods. He drinks mostly water, Gatorade and occasional Pepsi. Physical activity: Sedentary Depression/mood screen: Negative, PHQ 9 score of 0 Hearing: Intact to whispered voice Visual acuity: Grossly normal, performs annual eye exam  ADLs: Capable Fall risk: None Home safety: Good Cognitive evaluation: Intact to orientation, naming, recall and repetition EOL planning: No adv directives, full code/ I agree  Preventative Medicine: Encouraged him to get a flu shot in the fall. Tetanus UTD. Pneumovax due, will get with flu shot in the fall. Covid UTD. He declines zostovx or shingrix at this time. Overdue for colon cancer screening, he declines at this time. Encouraged him to consume a balanced diet and exercise regimen. Advised  him to see an eye doctor and dentist annually. Will check CBC, CMET, Lipid, PSA and A1C today. Due dates of screening exam given to pt as part of his AVS.   Next appointment: 6 months follow up chronic conditions.   Webb Silversmith, NP This visit occurred during the SARS-CoV-2 public health emergency.  Safety protocols were in place, including screening questions prior to the visit, additional usage of staff PPE, and extensive cleaning of exam room while observing appropriate contact time as indicated for disinfecting solutions.

## 2019-07-09 NOTE — Assessment & Plan Note (Signed)
CMET and Lipid profile today Encouraged him to consume a low fat diet Continue Atorvastatin 

## 2019-07-09 NOTE — Assessment & Plan Note (Signed)
Encouraged smoking cessation Continue Atorvastatin and Plavix

## 2019-07-09 NOTE — Assessment & Plan Note (Signed)
Continue Atorvastatin and Plavix CBC, CMET and lipid profile today Encouraged him to consume a low fat diet He will continue to follow with cardiology

## 2019-07-09 NOTE — Assessment & Plan Note (Signed)
Continue Trazadone prn

## 2019-07-09 NOTE — Assessment & Plan Note (Signed)
Stable on Losartan CMET today

## 2019-07-09 NOTE — Assessment & Plan Note (Signed)
Encouraged daily stretching, physical exercise

## 2019-07-09 NOTE — Assessment & Plan Note (Signed)
Deteriorated Advised him to reach out to pulm for further advice Continue Trelegy and Albuterol Encouraged smoking cessation

## 2019-07-09 NOTE — Assessment & Plan Note (Signed)
Follows with oncology Encouraged smoking cessation

## 2019-07-10 LAB — HEMOGLOBIN A1C
Hgb A1c MFr Bld: 5.3 % of total Hgb (ref <5.7)
Mean Plasma Glucose: 105 (calc)
eAG (mmol/L): 5.8 (calc)

## 2019-07-10 LAB — CBC
HCT: 49.5 % (ref 38.5–50.0)
Hemoglobin: 17.2 g/dL — ABNORMAL HIGH (ref 13.2–17.1)
MCH: 33.4 pg — ABNORMAL HIGH (ref 27.0–33.0)
MCHC: 34.7 g/dL (ref 32.0–36.0)
MCV: 96.1 fL (ref 80.0–100.0)
MPV: 11 fL (ref 7.5–12.5)
Platelets: 232 10*3/uL (ref 140–400)
RBC: 5.15 10*6/uL (ref 4.20–5.80)
RDW: 13.8 % (ref 11.0–15.0)
WBC: 9.3 10*3/uL (ref 3.8–10.8)

## 2019-07-10 LAB — LIPID PANEL
Cholesterol: 192 mg/dL (ref <200)
HDL: 101 mg/dL (ref >=40)
LDL Cholesterol (Calc): 75 mg/dL (calc)
Non-HDL Cholesterol (Calc): 91 mg/dL (calc) (ref <130)
Total CHOL/HDL Ratio: 1.9 (calc) (ref <5.0)
Triglycerides: 79 mg/dL (ref <150)

## 2019-07-10 LAB — COMPREHENSIVE METABOLIC PANEL
AG Ratio: 1.8 (calc) (ref 1.0–2.5)
ALT: 83 U/L — ABNORMAL HIGH (ref 9–46)
AST: 77 U/L — ABNORMAL HIGH (ref 10–35)
Albumin: 4.1 g/dL (ref 3.6–5.1)
Alkaline phosphatase (APISO): 112 U/L (ref 35–144)
BUN: 9 mg/dL (ref 7–25)
CO2: 27 mmol/L (ref 20–32)
Calcium: 8.7 mg/dL (ref 8.6–10.3)
Chloride: 97 mmol/L — ABNORMAL LOW (ref 98–110)
Creat: 0.92 mg/dL (ref 0.70–1.25)
Globulin: 2.3 g/dL (calc) (ref 1.9–3.7)
Glucose, Bld: 130 mg/dL — ABNORMAL HIGH (ref 65–99)
Potassium: 3.6 mmol/L (ref 3.5–5.3)
Sodium: 139 mmol/L (ref 135–146)
Total Bilirubin: 0.8 mg/dL (ref 0.2–1.2)
Total Protein: 6.4 g/dL (ref 6.1–8.1)

## 2019-07-10 LAB — PSA: PSA: 6.1 ng/mL — ABNORMAL HIGH (ref <=4.0)

## 2019-07-13 NOTE — Addendum Note (Signed)
Addended by: Lindalou Hose Y on: 07/13/2019 05:00 PM   Modules accepted: Orders

## 2019-07-20 ENCOUNTER — Other Ambulatory Visit: Payer: Self-pay

## 2019-07-20 ENCOUNTER — Ambulatory Visit (INDEPENDENT_AMBULATORY_CARE_PROVIDER_SITE_OTHER): Payer: Medicare Other | Admitting: Internal Medicine

## 2019-07-20 ENCOUNTER — Encounter: Payer: Self-pay | Admitting: Internal Medicine

## 2019-07-20 ENCOUNTER — Ambulatory Visit (INDEPENDENT_AMBULATORY_CARE_PROVIDER_SITE_OTHER)
Admission: RE | Admit: 2019-07-20 | Discharge: 2019-07-20 | Disposition: A | Discharge status: Home / Self Care | Payer: Medicare Other | Source: Ambulatory Visit | Attending: Internal Medicine | Admitting: Internal Medicine

## 2019-07-20 VITALS — BP 142/82 | HR 91 | Temp 98.1°F | Wt 187.0 lb

## 2019-07-20 DIAGNOSIS — M25551 Pain in right hip: Secondary | ICD-10-CM

## 2019-07-20 DIAGNOSIS — W19XXXA Unspecified fall, initial encounter: Secondary | ICD-10-CM | POA: Diagnosis not present

## 2019-07-20 DIAGNOSIS — S7001XA Contusion of right hip, initial encounter: Secondary | ICD-10-CM | POA: Diagnosis not present

## 2019-07-20 DIAGNOSIS — S79911A Unspecified injury of right hip, initial encounter: Secondary | ICD-10-CM | POA: Diagnosis not present

## 2019-07-20 MED ORDER — TRAMADOL HCL 50 MG PO TABS: 50.0000 mg | ORAL_TABLET | Freq: Three times a day (TID) | ORAL | 0 refills | Status: AC | PRN

## 2019-07-20 NOTE — Patient Instructions (Signed)
Hip Pain The hip is the joint between the upper legs and the lower pelvis. The bones, cartilage, tendons, and muscles of your hip joint support your body and allow you to move around. Hip pain can range from a minor ache to severe pain in one or both of your hips. The pain may be felt on the inside of the hip joint near the groin, or on the outside near the buttocks and upper thigh. You may also have swelling or stiffness in your hip area. Follow these instructions at home: Managing pain, stiffness, and swelling      If directed, put ice on the painful area. To do this: ? Put ice in a plastic bag. ? Place a towel between your skin and the bag. ? Leave the ice on for 20 minutes, 2-3 times a day.  If directed, apply heat to the affected area as often as told by your health care provider. Use the heat source that your health care provider recommends, such as a moist heat pack or a heating pad. ? Place a towel between your skin and the heat source. ? Leave the heat on for 20-30 minutes. ? Remove the heat if your skin turns bright red. This is especially important if you are unable to feel pain, heat, or cold. You may have a greater risk of getting burned. Activity  Do exercises as told by your health care provider.  Avoid activities that cause pain. General instructions   Take over-the-counter and prescription medicines only as told by your health care provider.  Keep a journal of your symptoms. Write down: ? How often you have hip pain. ? The location of your pain. ? What the pain feels like. ? What makes the pain worse.  Sleep with a pillow between your legs on your most comfortable side.  Keep all follow-up visits as told by your health care provider. This is important. Contact a health care provider if:  You cannot put weight on your leg.  Your pain or swelling continues or gets worse after one week.  It gets harder to walk.  You have a fever. Get help right away  if:  You fall.  You have a sudden increase in pain and swelling in your hip.  Your hip is red or swollen or very tender to touch. Summary  Hip pain can range from a minor ache to severe pain in one or both of your hips.  The pain may be felt on the inside of the hip joint near the groin, or on the outside near the buttocks and upper thigh.  Avoid activities that cause pain.  Write down how often you have hip pain, the location of the pain, what makes it worse, and what it feels like. This information is not intended to replace advice given to you by your health care provider. Make sure you discuss any questions you have with your health care provider. Document Revised: 07/20/2018 Document Reviewed: 07/20/2018 Elsevier Patient Education  2020 [employer] -- 

## 2019-07-20 NOTE — Progress Notes (Signed)
Subjective:    Patient ID: Joshua [name], male    DOB: 1955-06-20, 64 y.o.   MRN: [id number]  HPI  Pt presents to the clinic today with c/o bruising, mass and pain of right hip. He reports he fell onto a small brick wall 1 week ago. He describes the pain as constant, sharp pain, worse with movement. The pain radiates into his right lower back. He denies numbness or tingling in the right leg but has noticed some weakness. He has been taking Tylenol without any relief. He is taking ASA and Plavix.  Review of Systems      Past Medical History:  Diagnosis Date  . AC (acromioclavicular) joint bone spurs, unspecified laterality 11/25/2016  . Arthritis    "back" (05/16/2015)  . CAD in native artery, occluded Lcx and rotational atherectomy to LAD with DES 05/17/15 05/18/2015  . Cavitary lesion of lung 02/25/2018  . Chronic back pain    "all my back"  . Chronic bronchitis ([place])   . COPD (chronic obstructive pulmonary disease) ([place])   . Diabetes mellitus without complication ([place])   . GERD (gastroesophageal reflux disease)   . H/O blood clots    "had them in my back; put filter in before one of my neck ORs"  . History of gout   . Hyperlipidemia   . Hypertension   . Neuromuscular disorder ([place])   . PAD (peripheral artery disease) ([place])   . Phlebitis   . Positive TB test   . Seasonal allergies   . Shortness of breath   . Spinal disease     Current Outpatient Medications  Medication Sig Dispense Refill  . albuterol (PROAIR HFA) 108 (90 Base) MCG/ACT inhaler USE 2 PUFFS EVERY 6 HOURS&nbsp;&nbsp;AS NEEDED FOR WHEEZING OR&nbsp;&nbsp;SHORTNESS OF BREATH 34 g 3  . albuterol (PROVENTIL) (2.5 MG/3ML) 0.083% nebulizer solution Take 3 mLs (2.5 mg total) by nebulization every 6 (six) hours as needed for wheezing or shortness of breath. 75 mL 5  . aspirin EC 81 MG tablet Take 81 mg by mouth daily.    [name] atorvastatin (LIPITOR) 40 MG tablet TAKE 1 TABLET BY MOUTH  DAILY AT 6 PM 90 tablet 0  .  Blood Glucose Monitoring Suppl (ONETOUCH VERIO) w/Device KIT 1 Device by Does not apply route once. 1 kit 0  . clopidogrel (PLAVIX) 75 MG tablet TAKE 1 TABLET BY MOUTH  DAILY 90 tablet 0  . cyclobenzaprine (FLEXERIL) 10 MG tablet Take 1 tablet (10 mg total) by mouth 3 (three) times daily as needed for muscle spasms. 30 tablet 0  . diphenhydrAMINE (BENADRYL) 50 MG tablet Take 0.5 tablets (25 mg total) by mouth once. 1 hr prior to CT scan 30 tablet 0  . fexofenadine (ALLEGRA ALLERGY) 180 MG tablet Take 1 tablet (180 mg total) by mouth daily. 90 tablet 3  . Fluticasone-Umeclidin-Vilant (TRELEGY ELLIPTA) 100-62.5-25 MCG/INH AEPB Inhale 1 puff into the lungs daily. 2 each 5  . losartan (COZAAR) 25 MG tablet TAKE 1 TABLET BY MOUTH  DAILY 90 tablet 0  . nitroGLYCERIN (NITROSTAT) 0.4 MG SL tablet Place 1 tablet (0.4 mg total) under the tongue every 5 (five) minutes as needed for chest pain. 25 tablet 4  . Omega-3 Fatty Acids (FISH OIL) 1000 MG CAPS Take 1,000 mg by mouth daily.     [name] omeprazole (PRILOSEC) 40 MG capsule Take 1 capsule (40 mg total) by mouth daily. Patient needs office visit for further refills!! 90 capsule 0  . ONE TOUCH  LANCETS MISC 1 each by Does not apply route 3 (three) times daily. 200 each 5  . ONETOUCH ULTRA test strip USE TO TEST 3 TIMES DAILY 300 strip 3  . OVER THE COUNTER MEDICATION Apply 1 application topically daily as needed (neuropathy pain). Hemp oil cream    . traZODone (DESYREL) 100 MG tablet TAKE 1 TABLET BY MOUTH AT  BEDTIME 90 tablet 0   No current facility-administered medications for this visit.    Allergies  Allergen Reactions  . Lisinopril Hives and Itching  . Amitriptyline Other (See Comments)    Makes pt feel "weird" and nausea.  . Contrast Media [Iodinated Diagnostic Agents] Hives and Itching    Pt can use if taking benadryl   . Ibuprofen Other (See Comments)    Internal bleeding    Family History  Problem Relation Age of Onset  . Stomach cancer  Mother        Deceased, 29s  . Hypertension Mother   . Hypertension Father   . Stroke Father   . Heart attack Father        Deceased, 51  . Aneurysm Brother   . Healthy Daughter   . Healthy Maternal Grandmother   . Leukemia Grandchild   . Diabetes Neg Hx   . Early death Neg Hx   . Colon cancer Neg Hx   . Pancreatic cancer Neg Hx     Social History   Socioeconomic History  . Marital status: Married    Spouse name: Not on file  . Number of children: Not on file  . Years of education: 7  . Highest education level: Not on file  Occupational History  . Occupation: Disabled    Employer: DISABLED  Tobacco Use  . Smoking status: Current Every Day Smoker    Packs/day: 0.75    Years: 49.00    Pack years: 36.75    Types: Cigarettes    Last attempt to quit: 03/11/2018    Years since quitting: 1.3  . Smokeless tobacco: Never Used  . Tobacco comment: half a pack a day 12/25/2017  Substance and Sexual Activity  . Alcohol use: Yes    Alcohol/week: 0.0 standard drinks    Comment: 2019 NO ALCOHOL IN 2 MONTHS  . Drug use: No  . Sexual activity: Not Currently  Other Topics Concern  . Not on file  Social History Narrative   Regular exercise-no   Caffeine Use-yes   Lives with wife in a one story home. Has 2 children.  On disability for low back pain.  Used to work as a Games developer, lasted worked in 2007.     Education: 7th grade.   Social Determinants of Health   Financial Resource Strain:   . Difficulty of Paying Living Expenses:   Food Insecurity:   . Worried About Charity fundraiser in the Last Year:   . Arboriculturist in the Last Year:   Transportation Needs:   . Film/video editor (Medical):   [name] Lack of Transportation (Non-Medical):   Physical Activity:   . Days of Exercise per Week:   . Minutes of Exercise per Session:   Stress:   . Feeling of Stress :   Social Connections:   . Frequency of Communication with Friends and Family:   . Frequency of Social  Gatherings with Friends and Family:   . Attends Religious Services:   . Active Member of Clubs or Organizations:   . Attends Archivist Meetings:   .  Marital Status:   Intimate Partner Violence:   . Fear of Current or Ex-Partner:   . Emotionally Abused:   [name] Physically Abused:   . Sexually Abused:      Constitutional: Denies fever, malaise, fatigue, headache or abrupt weight changes.  Respiratory: Denies difficulty breathing, shortness of breath, cough or sputum production.   Cardiovascular: Denies chest pain, chest tightness, palpitations or swelling in the hands or feet.  Musculoskeletal: Pt reports right hip pain. Denies  muscle pain or joint swelling.  Skin: Pt reports bruising over right hip. Denies redness, rashes, lesions or ulcercations.  Neurological: Pt reports weakness of RLE. Denies numbness, tingling or problems with balance and coordination.    No other specific complaints in a complete review of systems (except as listed in HPI above).  Objective:   Physical Exam  BP (!) 142/82   Pulse 91   Temp 98.1 F (36.7 C) (Temporal)   Wt 187 lb (84.8 kg)   SpO2 97%   BMI 27.62 kg/m   Wt Readings from Last 3 Encounters:  07/09/19 191 lb (86.6 kg)  09/16/18 219 lb 3.2 oz (99.4 kg)  04/02/18 217 lb 6.4 oz (98.6 kg)    General: Appears his stated age, well developed, well nourished in NAD. Skin: Traumatic hematoma noted over right hip. Cardiovascular: Normal rate and rhythm. Pulmonary/Chest: Increased effort. Bilateral wheezing and rhonchi noted.  Musculoskeletal: Normal flexion but has trouble getting back up to a neutral position. Normal flexion, extension and rotation. Pain with abduction and external rotation of the left hip. Normal adduction and internal rotation of the right hip. Pain with palpation over the right hip. Strength 4/5 RLE. Limps with normal gait. Neurological: Alert and oriented.    BMET    Component Value Date/Time   NA 139 07/09/2019  1435   NA 139 01/02/2018 0927   NA 140 11/25/2016 1430   K 3.6 07/09/2019 1435   K 4.3 11/25/2016 1430   CL 97 (L) 07/09/2019 1435   CO2 27 07/09/2019 1435   CO2 27 11/25/2016 1430   GLUCOSE 130 (H) 07/09/2019 1435   GLUCOSE 101 11/25/2016 1430   BUN 9 07/09/2019 1435   BUN 12 01/02/2018 0927   BUN 12.6 11/25/2016 1430   CREATININE 0.92 07/09/2019 1435   CREATININE 0.9 11/25/2016 1430   CALCIUM 8.7 07/09/2019 1435   CALCIUM 10.3 11/25/2016 1430   GFRNONAA >60 07/09/2018 1610   GFRAA >60 07/09/2018 1610    Lipid Panel     Component Value Date/Time   CHOL 192 07/09/2019 1435   CHOL 215 (H) 11/21/2017 0810   TRIG 79 07/09/2019 1435   HDL 101 07/09/2019 1435   HDL 126 11/21/2017 0810   CHOLHDL 1.9 07/09/2019 1435   VLDL 37.0 07/09/2018 1040   LDLCALC 75 07/09/2019 1435    CBC    Component Value Date/Time   WBC 9.3 07/09/2019 1435   RBC 5.15 07/09/2019 1435   HGB 17.2 (H) 07/09/2019 1435   HGB 16.4 01/02/2018 0927   HGB 19.2 (H) 11/25/2016 1430   HCT 49.5 07/09/2019 1435   HCT 46.2 01/02/2018 0927   HCT 54.8 (H) 11/25/2016 1430   PLT 232 07/09/2019 1435   PLT 269 01/02/2018 0927   MCV 96.1 07/09/2019 1435   MCV 97 01/02/2018 0927   MCV 92.3 11/25/2016 1430   MCH 33.4 (H) 07/09/2019 1435   MCHC 34.7 07/09/2019 1435   RDW 13.8 07/09/2019 1435   RDW 13.9 01/02/2018 0927  RDW 15.0 (H) 11/25/2016 1430   LYMPHSABS 2.1 07/09/2018 1610   LYMPHSABS 2.5 11/25/2016 1430   MONOABS 1.0 07/09/2018 1610   MONOABS 1.3 (H) 11/25/2016 1430   EOSABS 0.3 07/09/2018 1610   EOSABS 0.3 11/25/2016 1430   BASOSABS 0.1 07/09/2018 1610   BASOSABS 0.0 11/25/2016 1430    Hgb A1C Lab Results  Component Value Date   HGBA1C 5.3 07/09/2019           Assessment & Plan:   Right Hip Pain, Hematoma s/p Fall:  Xray right hip today RX for Tramadol 50 mg TID prn Encouraged rest, ice and elevation  Return precautions discussed Webb Silversmith, NP This visit occurred during the  SARS-CoV-2 public health emergency.  Safety protocols were in place, including screening questions prior to the visit, additional usage of staff PPE, and extensive cleaning of exam room while observing appropriate contact time as indicated for disinfecting solutions.

## 2019-07-23 ENCOUNTER — Telehealth: Payer: Self-pay

## 2019-07-23 NOTE — Telephone Encounter (Signed)
Bethena Roys pts wife (DPR signed) left /vm requesting cb with recent xray results from 5/4.

## 2019-07-24 ENCOUNTER — Other Ambulatory Visit: Payer: Self-pay | Admitting: Internal Medicine

## 2019-07-24 DIAGNOSIS — K253 Acute gastric ulcer without hemorrhage or perforation: Secondary | ICD-10-CM

## 2019-07-24 DIAGNOSIS — K269 Duodenal ulcer, unspecified as acute or chronic, without hemorrhage or perforation: Secondary | ICD-10-CM

## 2019-07-26 NOTE — Telephone Encounter (Signed)
LVM for pt spouse Bethena Roys) to call back if she would still like to go over the xray results.

## 2019-07-26 NOTE — Telephone Encounter (Signed)
Pt spouse Bethena Roys) called back and informed of the normal xray results.

## 2019-08-06 ENCOUNTER — Other Ambulatory Visit: Payer: Self-pay

## 2019-08-06 ENCOUNTER — Ambulatory Visit (HOSPITAL_COMMUNITY)
Admission: RE | Admit: 2019-08-06 | Discharge: 2019-08-06 | Disposition: A | Discharge status: Home / Self Care | Payer: Medicare Other | Source: Ambulatory Visit | Attending: Radiation Oncology | Admitting: Radiation Oncology

## 2019-08-06 ENCOUNTER — Telehealth: Payer: Self-pay

## 2019-08-06 DIAGNOSIS — C3411 Malignant neoplasm of upper lobe, right bronchus or lung: Secondary | ICD-10-CM | POA: Diagnosis not present

## 2019-08-06 NOTE — Telephone Encounter (Signed)
Left VM msg in regards to telephone follow-up on 08/06/19 with Shona Simpson PA. Left telephone number and advised pt called back in regards to appointment.

## 2019-08-09 ENCOUNTER — Other Ambulatory Visit: Payer: Self-pay

## 2019-08-09 ENCOUNTER — Encounter: Payer: Self-pay | Admitting: Radiation Oncology

## 2019-08-09 ENCOUNTER — Telehealth: Payer: Self-pay

## 2019-08-09 ENCOUNTER — Ambulatory Visit
Admission: RE | Admit: 2019-08-09 | Discharge: 2019-08-09 | Disposition: A | Discharge status: Home / Self Care | Payer: Medicare Other | Source: Ambulatory Visit | Attending: Radiation Oncology | Admitting: Radiation Oncology

## 2019-08-09 DIAGNOSIS — C3411 Malignant neoplasm of upper lobe, right bronchus or lung: Secondary | ICD-10-CM | POA: Diagnosis not present

## 2019-08-09 DIAGNOSIS — J449 Chronic obstructive pulmonary disease, unspecified: Secondary | ICD-10-CM | POA: Diagnosis not present

## 2019-08-09 DIAGNOSIS — Z08 Encounter for follow-up examination after completed treatment for malignant neoplasm: Secondary | ICD-10-CM | POA: Diagnosis not present

## 2019-08-09 NOTE — Telephone Encounter (Signed)
Called patient to remind him of appointment and to complete meaningful use.

## 2019-08-09 NOTE — Progress Notes (Signed)
Radiation Oncology         ([phone]) [phone] ________________________________  Initial Outpatient Consultation - Conducted via telephone due to current COVID-19 concerns for limiting patient exposure  I spoke with the patient to conduct this consult visit via telephone to spare the patient unnecessary potential exposure in the healthcare setting during the current COVID-19 pandemic. The patient was notified in advance and was offered a [place] meeting to allow for face to face communication but unfortunately reported that they did not have the appropriate resources/technology to support such a visit and instead preferred to proceed with a telephone consult.    Name: Joshua [name]        MRN: [id number]  Date of Service: 08/09/2019 DOB: 04-29-1955  BH:ALPFX, Joshua Keens, NP  Grace Isaac, MD     REFERRING PHYSICIAN: Grace Isaac, MD   DIAGNOSIS: The encounter diagnosis was Malignant neoplasm of upper lobe of right lung [facility]).   HISTORY OF PRESENT ILLNESS: Joshua [name] is a 64 y.o. male originally seen at the request of Dr. Servando Snare for a newly diagnosed early stage lung cancer. The patient has multiple comorbidities but has been followed by imaging for a cystic change in the RUL in the past. Apparently this was not present in 2018, but he was sent to the lung cancer screening clinic for CT imaging and on 01/12/18 this revealed a 2.3 x 2.7 cm mass in the RUL that was new since 2014, but had increased in size since a prior scan in 2018. He underwent PET imaging on 02/09/18 that did not reveal hypermetabolic changes. He then was counseled on bronchoscopy with EBUS and underwent this procedure on 02/25/18. Brushings from the RUL as well as a biopsy revealed NSCLC. The biopsy identified adenocarcinoma as the phenotype. He met with Dr. Servando Snare but given prior coronary vessel disease, he was not felt to be a good candidate for lobectomy, and he proceeded with stereotactic body radiotherapy  (SBRT). He has done well since and had a repeat san on 08/06/19 that revealed stable post treatment radiation fibrosis along the site of the RUL without new or progressive lesions. He's contacted today to review these results.    PREVIOUS RADIATION THERAPY:  04/14/18-04/21/2018 SBRT Treatment: The RUL target was treated to 54 Gy in 3 fractions   PAST MEDICAL HISTORY:  Past Medical History:  Diagnosis Date  . AC (acromioclavicular) joint bone spurs, unspecified laterality 11/25/2016  . Arthritis    "back" (05/16/2015)  . CAD in native artery, occluded Lcx and rotational atherectomy to LAD with DES 05/17/15 05/18/2015  . Cavitary lesion of lung 02/25/2018  . Chronic back pain    "all my back"  . Chronic bronchitis ([place])   . COPD (chronic obstructive pulmonary disease) ([place])   . Diabetes mellitus without complication ([place])   . GERD (gastroesophageal reflux disease)   . H/O blood clots    "had them in my back; put filter in before one of my neck ORs"  . History of gout   . Hyperlipidemia   . Hypertension   . Neuromuscular disorder ([place])   . PAD (peripheral artery disease) ([place])   . Phlebitis   . Positive TB test   . Seasonal allergies   . Shortness of breath   . Spinal disease        PAST SURGICAL HISTORY: Past Surgical History:  Procedure Laterality Date  . ABDOMINAL AORTOGRAM W/LOWER EXTREMITY  01/07/2018  . ABDOMINAL AORTOGRAM W/LOWER EXTREMITY N/A 01/07/2018  Procedure: ABDOMINAL AORTOGRAM W/LOWER EXTREMITY;  Surgeon: Wellington Hampshire, MD;  Location: [place] [facility];  Service: Cardiovascular;  Laterality: N/A;  . ANTERIOR CERVICAL DECOMP/DISCECTOMY FUSION  ~ 2001-2009 X 3  . ANTERIOR CERVICAL DECOMP/DISCECTOMY FUSION N/A 10/06/2012   Procedure: ANTERIOR CERVICAL DECOMPRESSION/DISCECTOMY FUSION 1 LEVEL;  Surgeon: [name], MD;  Location: [place] [facility];  Service: Neurosurgery;  Laterality: N/A;  C7T1 anterior cervical decompression with fusion plating and bonegraft   .  BACK SURGERY    . CARDIAC CATHETERIZATION  05/16/2015  . CARDIAC CATHETERIZATION N/A 05/16/2015   Procedure: Left Heart Cath and Coronary Angiography;  Surgeon: [name], MD;  Location: [place] [facility];  Service: Cardiovascular;  Laterality: N/A;  . CARDIAC CATHETERIZATION N/A 05/16/2015   Procedure: Coronary Balloon Angioplasty;  Surgeon: [name], MD;  Location: [place] [facility];  Service: Cardiovascular;  Laterality: N/A;  . CARDIAC CATHETERIZATION N/A 05/17/2015   Procedure: Coronary Stent Intervention Rotoblater;  Surgeon: [name], MD;  Location: [place] [facility];  Service: Cardiovascular;  Laterality: N/A;  . ELBOW SURGERY Left 2016   "had bone pieces removed"  . ESOPHAGEAL MANOMETRY N/A 12/30/2016   Procedure: ESOPHAGEAL MANOMETRY (EM);  Surgeon: Irene Shipper, MD;  Location: [facility];  Service: Endoscopy;  Laterality: N/A;  . EXCISIONAL HEMORRHOIDECTOMY    . FEMORAL BYPASS  ~ 2010  . IR RADIOLOGIST EVAL & MGMT  12/17/2016  . IVC FILTER PLACEMENT ([place] HX)  2010   "had blood clots in my back"  . NECK SURGERY  2014   2008-2014  . PERIPHERAL VASCULAR ATHERECTOMY  01/07/2018   Procedure: PERIPHERAL VASCULAR ATHERECTOMY;  Surgeon: Wellington Hampshire, MD;  Location: [place] [facility];  Service: Cardiovascular;;  . PERIPHERAL VASCULAR BALLOON ANGIOPLASTY  01/07/2018   Procedure: PERIPHERAL VASCULAR BALLOON ANGIOPLASTY;  Surgeon: Wellington Hampshire, MD;  Location: [place] [facility];  Service: Cardiovascular;;  . PERIPHERAL VASCULAR CATHETERIZATION N/A 02/14/2016   Procedure: Abdominal Aortogram w/Lower Extremity;  Surgeon: Wellington Hampshire, MD;  Location: [place] [facility];  Service: Cardiovascular;  Laterality: N/A;  . POSTERIOR LAMINECTOMY / DECOMPRESSION LUMBAR SPINE  2006   "bone spurs"  . VIDEO BRONCHOSCOPY WITH ENDOBRONCHIAL NAVIGATION Right 02/25/2018   Procedure: VIDEO BRONCHOSCOPY WITH ENDOBRONCHIAL NAVIGATION;  Surgeon: Garner Nash, DO;  Location:  [place];  Service: Thoracic;  Laterality: Right;  [name] VIDEO BRONCHOSCOPY WITH ENDOBRONCHIAL ULTRASOUND Right 02/25/2018   Procedure: VIDEO BRONCHOSCOPY WITH ENDOBRONCHIAL ULTRASOUND;  Surgeon: Garner Nash, DO;  Location: [place];  Service: Thoracic;  Laterality: Right;     FAMILY HISTORY:  Family History  Problem Relation Age of Onset  . Stomach cancer Mother        Deceased, 43s  . Hypertension Mother   . Hypertension Father   . Stroke Father   . Heart attack Father        Deceased, 70  . Aneurysm Brother   . Healthy Daughter   . Healthy Maternal Grandmother   . Leukemia Grandchild   . Diabetes Neg Hx   . Early death Neg Hx   . Colon cancer Neg Hx   . Pancreatic cancer Neg Hx      SOCIAL HISTORY:  reports that he has been smoking cigarettes. He has a 36.75 pack-year smoking history. He has never used smokeless tobacco. He reports current alcohol use. He reports that he does not use drugs. The patient is married and lives in [place].  ALLERGIES: Lisinopril, Amitriptyline, Contrast media [iodinated diagnostic agents], and Ibuprofen   MEDICATIONS:  Current Outpatient Medications  Medication Sig Dispense Refill  . albuterol (PROAIR HFA) 108 (90 Base) MCG/ACT inhaler USE 2 PUFFS EVERY 6 HOURS  AS NEEDED FOR WHEEZING OR  SHORTNESS OF BREATH 34 g 3  . albuterol (PROVENTIL) (2.5 MG/3ML) 0.083% nebulizer solution Take 3 mLs (2.5 mg total) by nebulization every 6 (six) hours as needed for wheezing or shortness of breath. 75 mL 5  . aspirin EC 81 MG tablet Take 81 mg by mouth daily.    [name] atorvastatin (LIPITOR) 40 MG tablet TAKE 1 TABLET BY MOUTH  DAILY AT 6 PM 90 tablet 0  . Blood Glucose Monitoring Suppl (ONETOUCH VERIO) w/Device KIT 1 Device by Does not apply route once. 1 kit 0  . clopidogrel (PLAVIX) 75 MG tablet TAKE 1 TABLET BY MOUTH  DAILY 90 tablet 0  . cyclobenzaprine (FLEXERIL) 10 MG tablet Take 1 tablet (10 mg total) by mouth 3 (three) times daily as needed for muscle  spasms. 30 tablet 0  . fexofenadine (ALLEGRA ALLERGY) 180 MG tablet Take 1 tablet (180 mg total) by mouth daily. 90 tablet 3  . Fluticasone-Umeclidin-Vilant (TRELEGY ELLIPTA) 100-62.5-25 MCG/INH AEPB Inhale 1 puff into the lungs daily. 2 each 5  . losartan (COZAAR) 25 MG tablet TAKE 1 TABLET BY MOUTH  DAILY 90 tablet 0  . nitroGLYCERIN (NITROSTAT) 0.4 MG SL tablet Place 1 tablet (0.4 mg total) under the tongue every 5 (five) minutes as needed for chest pain. 25 tablet 4  . Omega-3 Fatty Acids (FISH OIL) 1000 MG CAPS Take 1,000 mg by mouth daily.     [name] omeprazole (PRILOSEC) 40 MG capsule Take 1 capsule (40 mg total) by mouth daily. Patient needs office visit for further refills!! 90 capsule 0  . ONE TOUCH LANCETS MISC 1 each by Does not apply route 3 (three) times daily. 200 each 5  . ONETOUCH ULTRA test strip USE TO TEST 3 TIMES DAILY 300 strip 3  . OVER THE COUNTER MEDICATION Apply 1 application topically daily as needed (neuropathy pain). Hemp oil cream    . traZODone (DESYREL) 100 MG tablet TAKE 1 TABLET BY MOUTH AT  BEDTIME 90 tablet 0  . diphenhydrAMINE (BENADRYL) 50 MG tablet Take 0.5 tablets (25 mg total) by mouth once. 1 hr prior to CT scan 30 tablet 0   No current facility-administered medications for this encounter.     REVIEW OF SYSTEMS: On review of systems, the patient reports that he is doing pretty well. He is concerned though about an injury about a month ago that led to what sounds like a right chest wall contusion. He reports he is still sore where there was a subcutaneous blood clot. In the last 6 months he also describes weight loss that was unintended of about 23-25 pounds as well as intermittent hot flashes and sweats and multiple joint aches. He asks about my thoughts on an OTC joint supplement. He denies any bowel changes of late, but does have some hair loss that is described as mild. No other complaints are noted.     PHYSICAL EXAM:  Unable to assess due to encounter  type.  ECOG = 1  0 - Asymptomatic (Fully active, able to carry on all predisease activities without restriction)  1 - Symptomatic but completely ambulatory (Restricted in physically strenuous activity but ambulatory and able to carry out work of a light or sedentary nature. For example, light  housework, office work)  2 - Symptomatic, <50% in bed during the day (Ambulatory and capable of all self care but unable to carry out any work activities. Up and about more than 50% of waking hours)  3 - Symptomatic, >50% in bed, but not bedbound (Capable of only limited self-care, confined to bed or chair 50% or more of waking hours)  4 - Bedbound (Completely disabled. Cannot carry on any self-care. Totally confined to bed or chair)  5 - Death   Eustace Pen MM, Creech RH, Tormey DC, et al. [phone]). "Toxicity and response criteria of the [facility]". [place] Oncol. 5 (6): 649-55    LABORATORY DATA:  Lab Results  Component Value Date   WBC 9.3 07/09/2019   HGB 17.2 (H) 07/09/2019   HCT 49.5 07/09/2019   MCV 96.1 07/09/2019   PLT 232 07/09/2019   Lab Results  Component Value Date   NA 139 07/09/2019   K 3.6 07/09/2019   CL 97 (L) 07/09/2019   CO2 27 07/09/2019   Lab Results  Component Value Date   ALT 83 (H) 07/09/2019   AST 77 (H) 07/09/2019   ALKPHOS 80 07/09/2018   BILITOT 0.8 07/09/2019      RADIOGRAPHY: CT Chest Wo Contrast  Result Date: 08/06/2019 CLINICAL DATA:  Right upper lobe lung cancer, status post SBRT, restaging EXAM: CT CHEST WITHOUT CONTRAST TECHNIQUE: Multidetector CT imaging of the chest was performed following the standard protocol without IV contrast. COMPARISON:  02/04/2019 FINDINGS: Cardiovascular: Aortic atherosclerosis. Normal heart size. Three-vessel coronary artery calcifications. No pericardial effusion. Mediastinum/Nodes: No enlarged mediastinal, hilar, or axillary lymph nodes. Thyroid gland, trachea, and esophagus demonstrate no  significant findings. Lungs/Pleura: Mild paraseptal emphysema. Diffuse bilateral bronchial wall thickening and scattered plugging. Interval continued development of bandlike consolidation about a previously seen cavitary lesion of the anterior right upper lobe. No pleural effusion or pneumothorax. Upper Abdomen: No acute abnormality. Hepatic steatosis. Musculoskeletal: No chest wall mass or suspicious bone lesions identified. IMPRESSION: 1. Interval continued development of bandlike consolidation about a previously seen cavitary lesion of the anterior right upper lobe, consistent with evolving post radiation change. Continued attention on follow-up. 2. Diffuse bilateral bronchial wall thickening and scattered plugging, consistent with nonspecific infectious or inflammatory bronchitis. 3. Emphysema (ICD10-J43.9). 4. Coronary artery disease. Aortic Atherosclerosis (ICD10-I70.0). 5. Hepatic steatosis. Electronically Signed   By: Eddie Candle M.D.   On: 08/06/2019 09:37   DG Hip Unilat W OR W/O Pelvis 2-3 Views Right  Result Date: 07/20/2019 CLINICAL DATA:  Post fall, now with right hip pain. EXAM: DG HIP (WITH OR WITHOUT PELVIS) 2-3V RIGHT COMPARISON:  None. FINDINGS: No fracture or dislocation. Mild-to-moderate degenerative change the right hip with joint space loss, subchondral sclerosis and osteophytosis. No evidence of avascular necrosis. Limited visualization of the pelvis is normal. Degenerative change of the lower lumbar spine and contralateral left hip is suspected though incompletely evaluated. Phleboliths overlie the lower pelvis bilaterally. Vascular calcifications overlie the pelvis and bilateral thighs. Surgical clips overlie the right groin. No radiopaque foreign body. IMPRESSION: 1. No acute findings. 2. Mild-to-moderate degenerative change of the right hip. Electronically Signed   By: Sandi Mariscal M.D.   On: 07/20/2019 10:10       IMPRESSION/PLAN: 1. Stage IA3, cT1cN0M0 NSCLC, adenocarcinoma of  the RUL. The patient has done well clinically and radiographically since SBRT. I recommended that he have routine surveillance scans ever 6 months until he's 5 years out from treatment per NCCN  guidelines. He is in agreement with this.  2. COPD. The patient will follow up with pulmonology and we will follow this expectantly.  3. Weight loss, night sweats, and joint pains. I suggested the patient follow up with his PCP to see when the last time thyroid and testosterone studies may have been performed since I don't see them in epic. He will reach out and we will follow this expectantly.    Given current concerns for patient exposure during the COVID-19 pandemic, this encounter was conducted via telephone.  The patient has provided two factor identification and has given verbal consent for this type of encounter and has been advised to only accept a meeting of this type in a secure network environment. The time spent during this encounter was 35 minutes including preparation, discussion, and coordination of the patient's care. The attendants for this meeting included Hayden Pedro  and Lavonna Monarch.  During the encounter, Hayden Pedro was located remotely at home. Joshua [name] was located at home.      Carola Rhine, PAC

## 2019-09-06 ENCOUNTER — Telehealth: Payer: Self-pay

## 2019-09-06 NOTE — Telephone Encounter (Signed)
Left message on VM asking pt to let us know if he has had a diabetic eye exam in the last year. If so, where so we can request records. If not, would he like a referral.

## 2019-09-24 ENCOUNTER — Other Ambulatory Visit: Payer: Self-pay | Admitting: Cardiovascular Disease

## 2019-09-24 ENCOUNTER — Other Ambulatory Visit: Payer: Self-pay | Admitting: Internal Medicine

## 2019-09-24 DIAGNOSIS — I739 Peripheral vascular disease, unspecified: Secondary | ICD-10-CM

## 2019-09-24 DIAGNOSIS — Z79899 Other long term (current) drug therapy: Secondary | ICD-10-CM

## 2019-09-24 DIAGNOSIS — I10 Essential (primary) hypertension: Secondary | ICD-10-CM

## 2019-09-24 NOTE — Telephone Encounter (Signed)
Refill Request.  

## 2019-10-11 ENCOUNTER — Other Ambulatory Visit (INDEPENDENT_AMBULATORY_CARE_PROVIDER_SITE_OTHER): Payer: Medicare Other

## 2019-10-11 ENCOUNTER — Other Ambulatory Visit: Payer: Self-pay

## 2019-10-11 DIAGNOSIS — R972 Elevated prostate specific antigen [PSA]: Secondary | ICD-10-CM | POA: Diagnosis not present

## 2019-10-11 LAB — PSA: PSA: 4.47 ng/mL — ABNORMAL HIGH (ref 0.10–4.00)

## 2019-10-13 ENCOUNTER — Ambulatory Visit (INDEPENDENT_AMBULATORY_CARE_PROVIDER_SITE_OTHER): Payer: Medicare Other | Admitting: Family Medicine

## 2019-10-13 ENCOUNTER — Other Ambulatory Visit: Payer: Self-pay

## 2019-10-13 ENCOUNTER — Ambulatory Visit (INDEPENDENT_AMBULATORY_CARE_PROVIDER_SITE_OTHER)
Admission: RE | Admit: 2019-10-13 | Discharge: 2019-10-13 | Disposition: A | Discharge status: Home / Self Care | Payer: Medicare Other | Source: Ambulatory Visit | Attending: Family Medicine | Admitting: Family Medicine

## 2019-10-13 ENCOUNTER — Encounter: Payer: Self-pay | Admitting: Family Medicine

## 2019-10-13 VITALS — BP 120/60 | HR 87 | Temp 97.4°F | Ht 69.0 in | Wt 176.0 lb

## 2019-10-13 DIAGNOSIS — R1084 Generalized abdominal pain: Secondary | ICD-10-CM

## 2019-10-13 DIAGNOSIS — W19XXXA Unspecified fall, initial encounter: Secondary | ICD-10-CM | POA: Diagnosis not present

## 2019-10-13 DIAGNOSIS — M25511 Pain in right shoulder: Secondary | ICD-10-CM | POA: Diagnosis not present

## 2019-10-13 DIAGNOSIS — R1011 Right upper quadrant pain: Secondary | ICD-10-CM

## 2019-10-13 LAB — POC URINALSYSI DIPSTICK (AUTOMATED)
Bilirubin, UA: NEGATIVE
Blood, UA: NEGATIVE
Glucose, UA: NEGATIVE
Ketones, UA: NEGATIVE
Leukocytes, UA: NEGATIVE
Nitrite, UA: NEGATIVE
Protein, UA: POSITIVE — AB
Spec Grav, UA: <=1.005 — AB (ref 1.010–1.025)
Urobilinogen, UA: 1 E.U./dL
pH, UA: 6 (ref 5.0–8.0)

## 2019-10-13 NOTE — Patient Instructions (Addendum)
Increase prilosec to 40 mg daily ( 2 tabs of 20 mg). Please stop at the lab to have labs drawn. Stop alcohol.  We will call you with X-ray results.  If you have severe pain, fever or cannot keep down liquids... go ER.

## 2019-10-13 NOTE — Assessment & Plan Note (Signed)
No clear cardio pulmonary proceeding symptoms.

## 2019-10-13 NOTE — Assessment & Plan Note (Addendum)
Pain is epigastric, RUQ and suprapubic.  Increased bowel sounds.  Restart Prilosec given possible PUD, vs gastritis. Encouraged patient to stop alcohol use.  Will eval with labs to eval for infeciton, pancreatitis, liver issues.   No sign of kidney stone or UTI on UA.

## 2019-10-13 NOTE — Progress Notes (Signed)
Chief Complaint  Patient presents with  . Arm Pain    Right  . Abdominal Pain    History of Present Illness: HPI   64 year old male smoker, patient of [facility] Silversmith with history of  DM, HTN, GERD, COPD, CAD ,  Lung cancer s/p radiation, followed by ONC, presents with new onset pain in abdomen below belly button ongoing for months.  Stomach gurgling after eating. Pain is constant, 8/10,  worse in mornings 10./10,  No change with eating.  Diarrhea 5-6 times a day, no constipation. No blood in stool. Occ small amount of burning wth urination, nocturia, no urgency, no hematuria. No fever.  Liquor helps with pain. He drinks a pint a day.   GERD controlled on PPI.  Overdue for colon cancer screening.   CT 03/2018:  IMPRESSION: 1. No acute findings or explanation for the patient's symptoms. No evidence of appendicitis. 2. Stable mild extrahepatic biliary dilatation of undetermined etiology or significance. Given stability and lack of significant LFT elevation, this is likely physiologic. 3. New mild subsegmental atelectasis at the left lung base. 4. Sigmoid diverticulosis. 5. Coronary and aortic Atherosclerosis (ICD10-I70.0). Previous aortobifemoral grafting.  1 month ago he fell on right arm,  Since then pain between elbow and shoulder, laterally, no swelling, no bruising.  Pain with abduction, no int pr ext rotation. Tyelnol doesn't help.  He has been using hemp cream.  Some frequent falls.  Respiratory status poor but at baseline per pt on Trelegy. Copius mucus.  This visit occurred during the SARS-CoV-2 public health emergency.  Safety protocols were in place, including screening questions prior to the visit, additional usage of staff PPE, and extensive cleaning of exam room while observing appropriate contact time as indicated for disinfecting solutions.     COVID 19 screen:  No recent travel or known exposure to COVID19 The patient denies respiratory symptoms of COVID  19 at this time. The importance of social distancing was discussed today.     Review of Systems  Constitutional: Negative for chills and fever.  HENT: Negative for congestion and ear pain.   Eyes: Negative for pain and redness.  Respiratory: Negative for cough and shortness of breath.   Cardiovascular: Negative for chest pain, palpitations and leg swelling.  Gastrointestinal: Positive for abdominal pain and diarrhea. Negative for blood in stool, constipation, nausea and vomiting.  Genitourinary: Negative for dysuria.  Musculoskeletal: Negative for falls and myalgias.  Skin: Negative for rash.  Neurological: Negative for dizziness.  Psychiatric/Behavioral: Negative for depression. The patient is not nervous/anxious.       Past Medical History:  Diagnosis Date  . AC (acromioclavicular) joint bone spurs, unspecified laterality 11/25/2016  . Arthritis    "back" (05/16/2015)  . CAD in native artery, occluded Lcx and rotational atherectomy to LAD with DES 05/17/15 05/18/2015  . Cavitary lesion of lung 02/25/2018  . Chronic back pain    "all my back"  . Chronic bronchitis ([place])   . COPD (chronic obstructive pulmonary disease) ([place])   . Diabetes mellitus without complication ([place])   . GERD (gastroesophageal reflux disease)   . H/O blood clots    "had them in my back; put filter in before one of my neck ORs"  . History of gout   . Hyperlipidemia   . Hypertension   . Neuromuscular disorder ([place])   . PAD (peripheral artery disease) ([place])   . Phlebitis   . Positive TB test   . Seasonal allergies   .  Shortness of breath   . Spinal disease     reports that he has been smoking cigarettes. He has a 36.75 pack-year smoking history. He has never used smokeless tobacco. He reports current alcohol use. He reports that he does not use drugs.   Current Outpatient Medications:  .  albuterol (PROAIR HFA) 108 (90 Base) MCG/ACT inhaler, USE 2 PUFFS EVERY 6 HOURS&nbsp;&nbsp;AS NEEDED FOR WHEEZING  OR&nbsp;&nbsp;SHORTNESS OF BREATH, Disp: 34 g, Rfl: 3 .  albuterol (PROVENTIL) (2.5 MG/3ML) 0.083% nebulizer solution, Take 3 mLs (2.5 mg total) by nebulization every 6 (six) hours as needed for wheezing or shortness of breath., Disp: 75 mL, Rfl: 5 .  aspirin EC 81 MG tablet, Take 81 mg by mouth daily., Disp: , Rfl:  .  atorvastatin (LIPITOR) 40 MG tablet, Take 1 tablet (40 mg total) by mouth daily. <PLEASE MAKE APPOINTMENT FOR REFILLS>, Disp: 30 tablet, Rfl: 0 .  Blood Glucose Monitoring Suppl (ONETOUCH VERIO) w/Device KIT, 1 Device by Does not apply route once., Disp: 1 kit, Rfl: 0 .  clopidogrel (PLAVIX) 75 MG tablet, Take 1 tablet (75 mg total) by mouth daily. <PLEASE MAKE APPOINTMENT FOR REFILLS>, Disp: 30 tablet, Rfl: 0 .  cyclobenzaprine (FLEXERIL) 10 MG tablet, Take 1 tablet (10 mg total) by mouth 3 (three) times daily as needed for muscle spasms., Disp: 30 tablet, Rfl: 0 .  fexofenadine (ALLEGRA ALLERGY) 180 MG tablet, Take 1 tablet (180 mg total) by mouth daily., Disp: 90 tablet, Rfl: 3 .  Fluticasone-Umeclidin-Vilant (TRELEGY ELLIPTA) 100-62.5-25 MCG/INH AEPB, Inhale 1 puff into the lungs daily., Disp: 2 each, Rfl: 5 .  losartan (COZAAR) 25 MG tablet, TAKE 1 TABLET BY MOUTH  DAILY, Disp: 90 tablet, Rfl: 2 .  nitroGLYCERIN (NITROSTAT) 0.4 MG SL tablet, Place 1 tablet (0.4 mg total) under the tongue every 5 (five) minutes as needed for chest pain., Disp: 25 tablet, Rfl: 4 .  Omega-3 Fatty Acids (FISH OIL) 1000 MG CAPS, Take 1,000 mg by mouth daily. , Disp: , Rfl:  .  omeprazole (PRILOSEC) 40 MG capsule, Take 1 capsule (40 mg total) by mouth daily. Patient needs office visit for further refills!!, Disp: 90 capsule, Rfl: 0 .  ONE TOUCH LANCETS MISC, 1 each by Does not apply route 3 (three) times daily., Disp: 200 each, Rfl: 5 .  ONETOUCH ULTRA test strip, USE TO TEST 3 TIMES DAILY, Disp: 300 strip, Rfl: 3 .  OVER THE COUNTER MEDICATION, Apply 1 application topically daily as needed (neuropathy  pain). Hemp oil cream, Disp: , Rfl:  .  traZODone (DESYREL) 100 MG tablet, TAKE 1 TABLET BY MOUTH AT  BEDTIME, Disp: 90 tablet, Rfl: 2 .  diphenhydrAMINE (BENADRYL) 50 MG tablet, Take 0.5 tablets (25 mg total) by mouth once. 1 hr prior to CT scan, Disp: 30 tablet, Rfl: 0   Observations/Objective: Blood pressure (!) 120/60, pulse 87, temperature (!) 97.4 F (36.3 C), temperature source Temporal, height 5' 9" (1.753 m), weight 176 lb (79.8 kg), SpO2 94 %.  Physical Exam Constitutional:      Appearance: He is well-developed.  HENT:     Head: Normocephalic.     Right Ear: Hearing normal.     Left Ear: Hearing normal.     Nose: Nose normal.  Neck:     Thyroid: No thyroid mass or thyromegaly.     Vascular: No carotid bruit.     Trachea: Trachea normal.  Cardiovascular:     Rate and Rhythm: Normal rate and regular rhythm.  Pulses: Normal pulses.     Heart sounds: Heart sounds not distant. No murmur heard.  No friction rub. No gallop.      Comments: No peripheral edema Pulmonary:     Effort: Pulmonary effort is normal. No respiratory distress.     Breath sounds: Normal breath sounds.  Abdominal:     Tenderness: There is abdominal tenderness in the right upper quadrant, epigastric area, periumbilical area and suprapubic area. There is guarding. There is no right CVA tenderness, left CVA tenderness or rebound.     Hernia: A hernia is present. Hernia is present in the umbilical area.  Skin:    General: Skin is warm and dry.     Findings: No rash.  Psychiatric:        Speech: Speech normal.        Behavior: Behavior normal.        Thought Content: Thought content normal.      Assessment and Plan    Generalized abdominal pain Pain is epigastric, RUQ and suprapubic.  Increased bowel sounds.  Restart Prilosec given possible PUD, vs gastritis. Encouraged patient to stop alcohol use.  Will eval with labs to eval for infeciton, pancreatitis, liver issues.   No sign of kidney  stone or UTI on UA.  Acute pain of right shoulder S/P fall... swelling laterally over deltoid that may represent hematoma vs detoid injury/bursitits Will eval with X-ray but consider referral for possible steroid injeciton given NSAIDs currently contraindicated given GI issues.  Fall No clear cardio pulmonary proceeding symptoms.    Eliezer Lofts, MD

## 2019-10-13 NOTE — Assessment & Plan Note (Signed)
S/P fall... swelling laterally over deltoid that may represent hematoma vs detoid injury/bursitits Will eval with X-ray but consider referral for possible steroid injeciton given NSAIDs currently contraindicated given GI issues.

## 2019-10-14 ENCOUNTER — Other Ambulatory Visit: Payer: Self-pay | Admitting: Internal Medicine

## 2019-10-14 LAB — COMPREHENSIVE METABOLIC PANEL
ALT: 63 U/L — ABNORMAL HIGH (ref 0–53)
AST: 97 U/L — ABNORMAL HIGH (ref 0–37)
Albumin: 4 g/dL (ref 3.5–5.2)
Alkaline Phosphatase: 108 U/L (ref 39–117)
BUN: 9 mg/dL (ref 6–23)
CO2: 30 mEq/L (ref 19–32)
Calcium: 8.9 mg/dL (ref 8.4–10.5)
Chloride: 100 mEq/L (ref 96–112)
Creatinine, Ser: 0.79 mg/dL (ref 0.40–1.50)
GFR: 98.86 mL/min (ref >60.00)
Glucose, Bld: 107 mg/dL — ABNORMAL HIGH (ref 70–99)
Potassium: 3.3 mEq/L — ABNORMAL LOW (ref 3.5–5.1)
Sodium: 139 mEq/L (ref 135–145)
Total Bilirubin: 0.6 mg/dL (ref 0.2–1.2)
Total Protein: 6.7 g/dL (ref 6.0–8.3)

## 2019-10-14 LAB — CBC WITH DIFFERENTIAL/PLATELET
Basophils Absolute: 0.2 10*3/uL — ABNORMAL HIGH (ref 0.0–0.1)
Basophils Relative: 2.4 % (ref 0.0–3.0)
Eosinophils Absolute: 0.2 10*3/uL (ref 0.0–0.7)
Eosinophils Relative: 1.8 % (ref 0.0–5.0)
HCT: 46.3 % (ref 39.0–52.0)
Hemoglobin: 15.8 g/dL (ref 13.0–17.0)
Lymphocytes Relative: 21.9 % (ref 12.0–46.0)
Lymphs Abs: 2.2 10*3/uL (ref 0.7–4.0)
MCHC: 34.1 g/dL (ref 30.0–36.0)
MCV: 105.5 fl — ABNORMAL HIGH (ref 78.0–100.0)
Monocytes Absolute: 1.1 10*3/uL — ABNORMAL HIGH (ref 0.1–1.0)
Monocytes Relative: 10.8 % (ref 3.0–12.0)
Neutro Abs: 6.2 10*3/uL (ref 1.4–7.7)
Neutrophils Relative %: 63.1 % (ref 43.0–77.0)
Platelets: 250 10*3/uL (ref 150.0–400.0)
RBC: 4.39 Mil/uL (ref 4.22–5.81)
RDW: 15.4 % (ref 11.5–15.5)
WBC: 9.9 10*3/uL (ref 4.0–10.5)

## 2019-10-14 LAB — LIPASE: Lipase: 49 U/L (ref 11.0–59.0)

## 2019-10-14 NOTE — Addendum Note (Signed)
Addended byEliezer Lofts E on: 10/14/2019 01:02 PM   Modules accepted: Orders

## 2019-10-14 NOTE — Telephone Encounter (Signed)
Medication Refill - Medication:  Colchicine 0.6 mg  Has the patient contacted their pharmacy?  Yes advised to call for 3 month supply   Preferred Pharmacy (with phone number or street name):  [facility], [address], [address] Phone:  [phone]  Fax:  [phone]

## 2019-10-14 NOTE — Telephone Encounter (Signed)
This is not on current med list... please advise if pt is to continue medication

## 2019-10-14 NOTE — Telephone Encounter (Signed)
He is taking this daily or as needed

## 2019-10-15 MED ORDER — COLCHICINE 0.6 MG PO TABS
ORAL_TABLET | ORAL | 1 refills | Status: DC
Start: 2019-10-15 — End: 2019-11-28

## 2019-10-15 NOTE — Addendum Note (Signed)
Addended by: Lurlean Nanny on: 10/15/2019 11:08 AM   Modules accepted: Orders

## 2019-10-15 NOTE — Telephone Encounter (Signed)
Pt states he takes PRN and is almost out and currently having a possible gout flare... please advise Rx will need to sent to mail order

## 2019-10-18 ENCOUNTER — Ambulatory Visit
Admission: RE | Admit: 2019-10-18 | Discharge: 2019-10-18 | Disposition: A | Discharge status: Home / Self Care | Payer: Medicare Other | Source: Ambulatory Visit | Attending: Family Medicine | Admitting: Family Medicine

## 2019-10-18 DIAGNOSIS — R1011 Right upper quadrant pain: Secondary | ICD-10-CM

## 2019-10-20 ENCOUNTER — Telehealth: Payer: Self-pay | Admitting: Family Medicine

## 2019-10-20 DIAGNOSIS — R1011 Right upper quadrant pain: Secondary | ICD-10-CM

## 2019-10-20 DIAGNOSIS — F1010 Alcohol abuse, uncomplicated: Secondary | ICD-10-CM

## 2019-10-20 NOTE — Telephone Encounter (Signed)
-----   Message from Carter Kitten, [place] sent at 10/20/2019 10:34 AM EDT ----- Mr. Eisenhardt notified as instructed by telephone.  He is agreeable to GI referral.  He states he is taking Prilosec OTC 40 mg daily.  He states he thinks it has helped some but he says the RUQ pain is still bad.

## 2019-10-22 ENCOUNTER — Telehealth: Payer: Self-pay | Admitting: Internal Medicine

## 2019-10-22 ENCOUNTER — Telehealth: Payer: Self-pay

## 2019-10-22 ENCOUNTER — Encounter: Payer: Self-pay | Admitting: Nurse Practitioner

## 2019-10-22 NOTE — Telephone Encounter (Signed)
Joshua [name] from [facility] is requesting for pt to be seen ASAP for RUQ pain. 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  [phone]

## 2019-10-22 NOTE — Telephone Encounter (Signed)
Unable to speak with any one after trying all contact #s.sending note to [facility] CMA and Avie Echevaria NP and Worthy Boschert LPN. Left v/m requesting cb.

## 2019-10-22 NOTE — Telephone Encounter (Signed)
[facility] - Client TELEPHONE ADVICE RECORD [facility] Patient Name: Joshua [name] Gender: Male DOB: 1955-09-10 Age: 64 Y 64 M 28 D Return Phone Number: [phone] (Primary), [phone] (Secondary) [place] [address] Client [facility] - Client Client Site [place] - Day Physician Webb Silversmith - NP Contact Type Call Who Is Calling Patient / Member / Family / Caregiver Call Type Triage / Clinical Caller Name Dianna Deshler Relationship To Patient Spouse Return Phone Number [phone] (Primary) Chief Complaint ABDOMINAL PAIN - Severe and only in abdomen Reason for Call Symptomatic / Request for Health Information Initial Comment caller states her husband's having severe abdominal pain -- he's waiting for referral from the office for gallstones but in the meantime she states he's in pain [place] Not Listed [facility] Translation No Nurse Assessment Nurse: Rock Nephew, RN, Juliann Pulse Date/Time (Eastern Time): 10/22/2019 8:43:54 AM Confirm and document reason for call. If symptomatic, describe symptoms. ---Caller states her husband's having severe abdominal pain -- he's waiting for referral from the office for gallstones but in the meantime she states he's in pain ( he describes pain as a 10 / 10 ) . Has the patient had close contact with a person known or suspected to have the novel coronavirus illness OR traveled / lives in area with major community spread (including international travel) in the last 14 days from the onset of symptoms? * If Asymptomatic, screen for exposure and travel within the last 14 days. ---No Does the patient have any new or worsening symptoms? ---Yes Will a triage be completed? ---Yes Related visit to physician within the last 2 weeks? ---Yes Does the PT have any chronic conditions? (i.e. diabetes, asthma, this includes High risk factors for  pregnancy, etc.) ---Yes List chronic conditions. ---HTN and pre-diabetes Is this a behavioral health or substance abuse call? ---No PLEASE NOTE: All timestamps contained within this report are represented as [place] Standard Time. CONFIDENTIALTY NOTICE: This fax transmission is intended only for the addressee. It contains information that is legally privileged, confidential or otherwise protected from use or disclosure. If you are not the intended recipient, you are strictly prohibited from reviewing, disclosing, copying using or disseminating any of this information or taking any action in reliance on or regarding this information. If you have received this fax in error, please notify us immediately by telephone so that we can arrange for its return to [place]. Phone: [phone], Toll-Free: [phone], Fax: [phone] Page: 2 of 2 Call Id: [id number] Guidelines Guideline Title Affirmed Question Affirmed Notes Nurse Date/Time Eilene Ghazi Time) Abdominal Pain - Male [1] SEVERE pain (e.g., excruciating) AND [2] present > 1 hour Rock Nephew, RN, Juliann Pulse 10/22/2019 8:44:52 AM Disp. Time Eilene Ghazi Time) Disposition Final User 10/22/2019 8:41:17 AM Send to Urgent Daron Offer, [place] 10/22/2019 8:45:49 AM Go to ED Now Yes Rock Nephew, RN, Gara Kroner Disagree/Comply Comply Caller Understands Yes PreDisposition Call Doctor Care Advice Given Per Guideline GO TO ED NOW: * You need to be seen in the Emergency Department. NOTHING BY MOUTH: * Do not eat or drink anything for now. * Reason: condition may need surgery and general anesthesia. CARE ADVICE given per Abdominal Pain, Male (Adult) guideline. Referrals GO TO FACILITY OTHER - SPECIFY

## 2019-10-22 NOTE — Telephone Encounter (Signed)
Patient's wife Bethena Roys contacted the office. She states patient is still having persistent, and more severe RUQ abdominal pain. Phone call was sent to Access nurse.

## 2019-10-22 NOTE — Telephone Encounter (Signed)
He rarely picks up the phone. Can we try to check on his again?

## 2019-10-22 NOTE — Telephone Encounter (Signed)
Pt scheduled to see Alonza Bogus PA 10/27/19 at 1:30pm. Pt aware of appt.

## 2019-10-22 NOTE — Telephone Encounter (Signed)
Opened in error

## 2019-10-27 ENCOUNTER — Ambulatory Visit (INDEPENDENT_AMBULATORY_CARE_PROVIDER_SITE_OTHER): Payer: Medicare Other | Admitting: Gastroenterology

## 2019-10-27 ENCOUNTER — Encounter: Payer: Self-pay | Admitting: Gastroenterology

## 2019-10-27 VITALS — BP 100/72 | HR 64 | Ht 68.5 in | Wt 175.5 lb

## 2019-10-27 DIAGNOSIS — R1033 Periumbilical pain: Secondary | ICD-10-CM | POA: Diagnosis not present

## 2019-10-27 DIAGNOSIS — K253 Acute gastric ulcer without hemorrhage or perforation: Secondary | ICD-10-CM

## 2019-10-27 DIAGNOSIS — R7989 Other specified abnormal findings of blood chemistry: Secondary | ICD-10-CM | POA: Insufficient documentation

## 2019-10-27 DIAGNOSIS — R1084 Generalized abdominal pain: Secondary | ICD-10-CM | POA: Diagnosis not present

## 2019-10-27 DIAGNOSIS — K269 Duodenal ulcer, unspecified as acute or chronic, without hemorrhage or perforation: Secondary | ICD-10-CM | POA: Diagnosis not present

## 2019-10-27 DIAGNOSIS — K2970 Gastritis, unspecified, without bleeding: Secondary | ICD-10-CM | POA: Diagnosis not present

## 2019-10-27 DIAGNOSIS — B9681 Helicobacter pylori [H. pylori] as the cause of diseases classified elsewhere: Secondary | ICD-10-CM

## 2019-10-27 MED ORDER — OMEPRAZOLE 40 MG PO CPDR: 40.0000 mg | DELAYED_RELEASE_CAPSULE | Freq: Every day | ORAL | 4 refills | Status: DC

## 2019-10-27 MED ORDER — PREDNISONE 50 MG PO TABS
ORAL_TABLET | ORAL | 0 refills | Status: DC
Start: 2019-10-27 — End: 2020-05-11

## 2019-10-27 NOTE — Patient Instructions (Addendum)
If you are age 64 or older, your body mass index should be between 23-30. Your Body mass index is 26.3 kg/m. If this is out of the aforementioned range listed, please consider follow up with your Primary Care Provider.  If you are age 51 or younger, your body mass index should be between 19-25. Your Body mass index is 26.3 kg/m. If this is out of the aformentioned range listed, please consider follow up with your Primary Care Provider.   Continue Omeprazole 40 mg daily.  Prednisone 50 mg 13 hours, 7 hours and 1 hour prior to CT scan.   You have been scheduled for a CT scan of the abdomen and pelvis at [facility] ([address]---this is in the same building as [employer]).   You are scheduled on Thursday 10/28/19 at 9:15 am. You should arrive 15 minutes prior to your appointment time for registration. Please follow the written instructions below on the day of your exam:  WARNING: IF YOU ARE ALLERGIC TO IODINE/X-RAY DYE, PLEASE NOTIFY RADIOLOGY IMMEDIATELY AT [phone]! YOU WILL BE GIVEN A 13 HOUR PREMEDICATION PREP.  1) Do not eat or drink anything after 5:15 am (4 hours prior to your test) 2) You have been given 2 bottles of oral contrast to drink. The solution may taste better if refrigerated, but do NOT add ice or any other liquid to this solution. Shake well before drinking.    Drink 1 bottle of contrast @ 7 am (2 hours prior to your exam)  Drink 1 bottle of contrast @ 8 am (1 hour prior to your exam)  You may take any medications as prescribed with a small amount of water, if necessary. If you take any of the following medications: METFORMIN, GLUCOPHAGE, GLUCOVANCE, AVANDAMET, RIOMET, FORTAMET, [place] MET, JANUMET, GLUMETZA or METAGLIP, you MAY be asked to HOLD this medication 48 hours AFTER the exam.  The purpose of you drinking the oral contrast is to aid in the visualization of your intestinal tract. The contrast solution may cause some diarrhea.  Depending on your individual set of symptoms, you may also receive an intravenous injection of x-ray contrast/dye. Plan on being at [facility] for 30 minutes or longer, depending on the type of exam you are having performed.  This test typically takes 30-45 minutes to complete.  If you have any questions regarding your exam or if you need to reschedule, you may call the CT department at [phone] between the hours of 8:00 am and 5:00 pm, Monday-Friday.  _________________________________________________________________

## 2019-10-27 NOTE — Progress Notes (Signed)
10/27/2019 Joshua [name] [id number] 12-29-1955   HISTORY OF PRESENT ILLNESS: This is a 64 year old male who is a patient of Dr. Blanch Media.  He was last seen here in 2018 at which time he had an EGD and esophageal manometry performed.  Esophageal manometry was normal.  EGD showed erosive gastritis and duodenal ulcer/duodenitis.  CLO biopsies were positive for H. pylori.  Pylera was prescribed but he says that he was not able to afford it so never took treatment.  He is here today at the request of his PCP for evaluation of right upper quadrant abdominal pain.  He tells me that the pain is just new over the past couple of weeks.  He describes it in the right upper quadrant, but also epigastrium and periumbilical area.  He says it is constant but seems to be worse first thing in the morning and at night.  He tells me that he was unable to sleep the past 2 nights due to the pain.  He is on omeprazole 40 mg daily.  Lipase was normal.  CBC okay.  CMP revealed an elevated AST at 97 and ALT of 63.  Alk phos and total bili are normal.  He had mild elevation of both AST and ALT 3 months ago and mild elevation of AST about a year ago.  He admits to only drinking 2 alcoholic beverages per week.  He had a right upper quadrant abdominal ultrasound performed 9 days ago that showed cholelithiasis with mild gallbladder wall thickening and mild dilatation of the common bile duct to 8 mm as well as probable fatty infiltration of the liver.  No nausea, vomiting, fever, or chills.  He has had diarrhea for the past 2 days, but just started colchicine for gout flare.  He said he has had diarrhea with taking colchicine in the past.   Past Medical History:  Diagnosis Date  . AC (acromioclavicular) joint bone spurs, unspecified laterality 11/25/2016  . Arthritis    "back" (05/16/2015)  . CAD in native artery, occluded Lcx and rotational atherectomy to LAD with DES 05/17/15 05/18/2015  . Cavitary lesion of lung 02/25/2018    . Chronic back pain    "all my back"  . Chronic bronchitis ([place])   . COPD (chronic obstructive pulmonary disease) ([facility])   . Diabetes mellitus without complication ([facility])   . GERD (gastroesophageal reflux disease)   . H/O blood clots    "had them in my back; put filter in before one of my neck ORs"  . History of gout   . Hyperlipidemia   . Hypertension   . Neuromuscular disorder ([place])   . PAD (peripheral artery disease) ([place])   . Phlebitis   . Positive TB test   . Seasonal allergies   . Shortness of breath   . Spinal disease    Past Surgical History:  Procedure Laterality Date  . ABDOMINAL AORTOGRAM W/LOWER EXTREMITY  01/07/2018  . ABDOMINAL AORTOGRAM W/LOWER EXTREMITY N/A 01/07/2018   Procedure: ABDOMINAL AORTOGRAM W/LOWER EXTREMITY;  Surgeon: Wellington Hampshire, MD;  Location: [facility];  Service: Cardiovascular;  Laterality: N/A;  . ANTERIOR CERVICAL DECOMP/DISCECTOMY FUSION  ~ 2001-2009 X 3  . ANTERIOR CERVICAL DECOMP/DISCECTOMY FUSION N/A 10/06/2012   Procedure: ANTERIOR CERVICAL DECOMPRESSION/DISCECTOMY FUSION 1 LEVEL;  Surgeon: [name], MD;  Location: [facility];  Service: Neurosurgery;  Laterality: N/A;  C7T1 anterior cervical decompression with fusion plating and bonegraft   . BACK SURGERY    .  CARDIAC CATHETERIZATION  05/16/2015  . CARDIAC CATHETERIZATION N/A 05/16/2015   Procedure: Left Heart Cath and Coronary Angiography;  Surgeon: [name], MD;  Location: [facility];  Service: Cardiovascular;  Laterality: N/A;  . CARDIAC CATHETERIZATION N/A 05/16/2015   Procedure: Coronary Balloon Angioplasty;  Surgeon: [name], MD;  Location: [facility];  Service: Cardiovascular;  Laterality: N/A;  . CARDIAC CATHETERIZATION N/A 05/17/2015   Procedure: Coronary Stent Intervention Rotoblater;  Surgeon: [name], MD;  Location: [facility];  Service: Cardiovascular;  Laterality: N/A;  . ELBOW SURGERY Left 2016   "had bone pieces  removed"  . ESOPHAGEAL MANOMETRY N/A 12/30/2016   Procedure: ESOPHAGEAL MANOMETRY (EM);  Surgeon: Irene Shipper, MD;  Location: [facility];  Service: Endoscopy;  Laterality: N/A;  . EXCISIONAL HEMORRHOIDECTOMY    . FEMORAL BYPASS  ~ 2010  . IR RADIOLOGIST EVAL & MGMT  12/17/2016  . IVC FILTER PLACEMENT ([facility])  2010   "had blood clots in my back"  . NECK SURGERY  2014   2008-2014  . PERIPHERAL VASCULAR ATHERECTOMY  01/07/2018   Procedure: PERIPHERAL VASCULAR ATHERECTOMY;  Surgeon: Wellington Hampshire, MD;  Location: [facility];  Service: Cardiovascular;;  . PERIPHERAL VASCULAR BALLOON ANGIOPLASTY  01/07/2018   Procedure: PERIPHERAL VASCULAR BALLOON ANGIOPLASTY;  Surgeon: Wellington Hampshire, MD;  Location: [facility];  Service: Cardiovascular;;  . PERIPHERAL VASCULAR CATHETERIZATION N/A 02/14/2016   Procedure: Abdominal Aortogram w/Lower Extremity;  Surgeon: Wellington Hampshire, MD;  Location: [facility];  Service: Cardiovascular;  Laterality: N/A;  . POSTERIOR LAMINECTOMY / DECOMPRESSION LUMBAR SPINE  2006   "bone spurs"  . VIDEO BRONCHOSCOPY WITH ENDOBRONCHIAL NAVIGATION Right 02/25/2018   Procedure: VIDEO BRONCHOSCOPY WITH ENDOBRONCHIAL NAVIGATION;  Surgeon: Garner Nash, DO;  Location: [facility];  Service: Thoracic;  Laterality: Right;  [name] VIDEO BRONCHOSCOPY WITH ENDOBRONCHIAL ULTRASOUND Right 02/25/2018   Procedure: VIDEO BRONCHOSCOPY WITH ENDOBRONCHIAL ULTRASOUND;  Surgeon: Garner Nash, DO;  Location: [facility];  Service: Thoracic;  Laterality: Right;    reports that he has been smoking cigarettes. He has a 36.75 pack-year smoking history. He has never used smokeless tobacco. He reports current alcohol use. He reports that he does not use drugs. family history includes Aneurysm in his brother; Healthy in his daughter and maternal grandmother; Heart attack in his father; Hypertension in his father and mother; Leukemia in his grandchild; Stomach cancer in his mother;  Stroke in his father. Allergies  Allergen Reactions  . Lisinopril Hives and Itching  . Amitriptyline Other (See Comments)    Makes pt feel "weird" and nausea.  . Contrast Media [Iodinated Diagnostic Agents] Hives and Itching    Pt can use if taking benadryl   . Ibuprofen Other (See Comments)    Internal bleeding      Outpatient Encounter Medications as of 10/27/2019  Medication Sig  . albuterol (PROAIR HFA) 108 (90 Base) MCG/ACT inhaler USE 2 PUFFS EVERY 6 HOURS&nbsp;&nbsp;AS NEEDED FOR WHEEZING OR&nbsp;&nbsp;SHORTNESS OF BREATH  . albuterol (PROVENTIL) (2.5 MG/3ML) 0.083% nebulizer solution Take 3 mLs (2.5 mg total) by nebulization every 6 (six) hours as needed for wheezing or shortness of breath.  [name] aspirin EC 81 MG tablet Take 81 mg by mouth daily.  [name] atorvastatin (LIPITOR) 40 MG tablet Take 1 tablet (40 mg total) by mouth daily. <PLEASE MAKE APPOINTMENT FOR REFILLS>  . Blood Glucose Monitoring Suppl (ONETOUCH VERIO) w/Device KIT 1 Device by  Does not apply route once.  . clopidogrel (PLAVIX) 75 MG tablet Take 1 tablet (75 mg total) by mouth daily. <PLEASE MAKE APPOINTMENT FOR REFILLS>  . colchicine 0.6 MG tablet TAKE 2 TABLETS, THEN 1 TABLET AFTER 1 HOUR. CAN TAKE 1 TABLET TWICE A DAY THEREAFTER  . cyclobenzaprine (FLEXERIL) 10 MG tablet Take 1 tablet (10 mg total) by mouth 3 (three) times daily as needed for muscle spasms.  . fexofenadine (ALLEGRA ALLERGY) 180 MG tablet Take 1 tablet (180 mg total) by mouth daily.  . Fluticasone-Umeclidin-Vilant (TRELEGY ELLIPTA) 100-62.5-25 MCG/INH AEPB Inhale 1 puff into the lungs daily.  [name] losartan (COZAAR) 25 MG tablet TAKE 1 TABLET BY MOUTH  DAILY  . nitroGLYCERIN (NITROSTAT) 0.4 MG SL tablet Place 1 tablet (0.4 mg total) under the tongue every 5 (five) minutes as needed for chest pain.  . Omega-3 Fatty Acids (FISH OIL) 1000 MG CAPS Take 1,000 mg by mouth daily.   [name] omeprazole (PRILOSEC) 40 MG capsule Take 1 capsule (40 mg total) by mouth daily.  Patient needs office visit for further refills!!  . ONE TOUCH LANCETS MISC 1 each by Does not apply route 3 (three) times daily.  Glory Rosebush ULTRA test strip USE TO TEST 3 TIMES DAILY  . OVER THE COUNTER MEDICATION Apply 1 application topically daily as needed (neuropathy pain). Hemp oil cream  . traZODone (DESYREL) 100 MG tablet TAKE 1 TABLET BY MOUTH AT  BEDTIME  . diphenhydrAMINE (BENADRYL) 50 MG tablet Take 0.5 tablets (25 mg total) by mouth once. 1 hr prior to CT scan   No facility-administered encounter medications on file as of 10/27/2019.     REVIEW OF SYSTEMS  : All other systems reviewed and negative except where noted in the History of Present Illness.   PHYSICAL EXAM: BP 100/72   Pulse 64   Ht 5' 8.5" (1.74 m)   Wt 175 lb 8 oz (79.6 kg)   BMI 26.30 kg/m  General: Chronically ill-appearing white male in no acute distress Head: Normocephalic and atraumatic Eyes:  Sclerae anicteric, conjunctiva pink. Ears: Normal auditory acuity  Lungs: Clear throughout to auscultation; no W/R/R. Heart: Regular rate and rhythm; no M/R/G. Abdomen: Soft, non-distended.  BS present.  RUQ, epigastric, and periumbilical TTP. Musculoskeletal: Symmetrical with no gross deformities  Skin: No lesions on visible extremities Extremities: No edema  Neurological: Alert oriented x 4, grossly non-focal Psychological:  Alert and cooperative. Normal mood and affect  ASSESSMENT AND PLAN: *Abdominal pain (right upper quadrant, epigastric, and periumbilical): He does have cholelithiasis with mild gallbladder wall thickening on recent ultrasound as well as mild dilatation of common bile duct to 8 mm.  AST and ALT are slightly elevated, but alk phos and total bili are normal.  Pain is on the right side, but also epigastric and periumbilical.  Do not know if this is all gallbladder/gallstone related or if there is another issue going on as well.  He also has a history of erosive gastritis and duodenal  ulcer/duodenitis.  Tested positive for H. pylori, but never took treatment.  Question if this could be contributing.  Will check CT scan of the abdomen and pelvis with contrast. *Elevated LFTs: Tells me that he only drinks 2 alcoholic beverages per week.  He does have fatty liver on imaging, which could cause these fairly mild elevations.  Question if it could also be related to gallbladder issues that we are currently evaluating as cause of his pain. *H. pylori gastritis: CLO biopsies were  positive back in 2018.  Pylera was sent to the pharmacy, but he could not afford the medication so he never took treatment.  We will plan to treat him with the separate components once we are better able to sort out as to what may be causing his recent right upper quadrant and mid abdominal pain.  Continue omeprazole 40 mg daily for now.  New prescription sent to pharmacy.   CC:  Jearld Fenton, NP

## 2019-10-28 ENCOUNTER — Ambulatory Visit (INDEPENDENT_AMBULATORY_CARE_PROVIDER_SITE_OTHER)
Admission: RE | Admit: 2019-10-28 | Discharge: 2019-10-28 | Disposition: A | Discharge status: Home / Self Care | Payer: Medicare Other | Source: Ambulatory Visit | Attending: Gastroenterology | Admitting: Gastroenterology

## 2019-10-28 ENCOUNTER — Other Ambulatory Visit: Payer: Self-pay

## 2019-10-28 DIAGNOSIS — N200 Calculus of kidney: Secondary | ICD-10-CM | POA: Diagnosis not present

## 2019-10-28 DIAGNOSIS — R1033 Periumbilical pain: Secondary | ICD-10-CM

## 2019-10-28 DIAGNOSIS — K529 Noninfective gastroenteritis and colitis, unspecified: Secondary | ICD-10-CM | POA: Diagnosis not present

## 2019-10-28 DIAGNOSIS — K838 Other specified diseases of biliary tract: Secondary | ICD-10-CM | POA: Diagnosis not present

## 2019-10-28 DIAGNOSIS — R1084 Generalized abdominal pain: Secondary | ICD-10-CM | POA: Diagnosis not present

## 2019-10-28 DIAGNOSIS — K6389 Other specified diseases of intestine: Secondary | ICD-10-CM | POA: Diagnosis not present

## 2019-10-28 MED ORDER — IOHEXOL 300 MG/ML  SOLN
100.0000 mL | Freq: Once | INTRAMUSCULAR | Status: AC | PRN
  Administered 2019-10-28: 100 mL via INTRAVENOUS

## 2019-10-28 MED ORDER — PREDNISONE 1 MG PO TABS
50.0000 mg | ORAL_TABLET | Freq: Four times a day (QID) | ORAL | Status: DC
Start: 2019-10-28 — End: 2019-10-29

## 2019-10-28 MED ORDER — DIPHENHYDRAMINE HCL 25 MG PO CAPS: 50.0000 mg | ORAL_CAPSULE | Freq: Once | ORAL | Status: DC

## 2019-10-28 MED ORDER — DIPHENHYDRAMINE HCL 50 MG/ML IJ SOLN: 50.0000 mg | Freq: Once | INTRAMUSCULAR | Status: DC

## 2019-10-28 NOTE — Progress Notes (Signed)
Reviewed

## 2019-10-29 ENCOUNTER — Other Ambulatory Visit: Payer: Self-pay

## 2019-10-29 MED ORDER — METRONIDAZOLE 500 MG PO TABS: 500.0000 mg | ORAL_TABLET | Freq: Two times a day (BID) | ORAL | 0 refills | Status: AC

## 2019-10-29 MED ORDER — AMOXICILLIN 500 MG PO TABS
1000.0000 mg | ORAL_TABLET | Freq: Two times a day (BID) | ORAL | 0 refills | Status: AC
Start: 2019-10-29 — End: 2019-11-08

## 2019-10-29 MED ORDER — CLARITHROMYCIN 500 MG PO TABS
500.0000 mg | ORAL_TABLET | Freq: Two times a day (BID) | ORAL | 0 refills | Status: AC
Start: 2019-10-29 — End: 2019-11-08

## 2019-11-27 ENCOUNTER — Other Ambulatory Visit: Payer: Self-pay | Admitting: Internal Medicine

## 2019-11-30 ENCOUNTER — Telehealth: Payer: Self-pay | Admitting: Pulmonary Disease

## 2019-11-30 MED ORDER — ALBUTEROL SULFATE HFA 108 (90 BASE) MCG/ACT IN AERS: INHALATION_SPRAY | RESPIRATORY_TRACT | 3 refills | Status: DC

## 2019-11-30 NOTE — Telephone Encounter (Signed)
Refill of pt's albuterol inhaler has been sent to preferred pharmacy for pt. Called and spoke with pt letting him know this had been done and also stated to him that we needed to schedule him for a f/u as he was due for an appt. Pt verbalized understanding. Pt has been scheduled for an appt with BI 10/14. Nothing further needed.

## 2019-12-08 ENCOUNTER — Ambulatory Visit: Payer: Medicare Other | Admitting: Nurse Practitioner

## 2019-12-30 ENCOUNTER — Ambulatory Visit: Payer: Medicare Other | Admitting: Pulmonary Disease

## 2019-12-30 DIAGNOSIS — M5136 Other intervertebral disc degeneration, lumbar region: Secondary | ICD-10-CM | POA: Diagnosis not present

## 2019-12-30 DIAGNOSIS — E1140 Type 2 diabetes mellitus with diabetic neuropathy, unspecified: Secondary | ICD-10-CM | POA: Diagnosis not present

## 2019-12-30 DIAGNOSIS — M545 Low back pain, unspecified: Secondary | ICD-10-CM | POA: Diagnosis not present

## 2020-01-03 ENCOUNTER — Other Ambulatory Visit: Payer: Self-pay | Admitting: Cardiovascular Disease

## 2020-01-03 DIAGNOSIS — Z79899 Other long term (current) drug therapy: Secondary | ICD-10-CM

## 2020-01-03 DIAGNOSIS — I10 Essential (primary) hypertension: Secondary | ICD-10-CM

## 2020-01-03 DIAGNOSIS — I739 Peripheral vascular disease, unspecified: Secondary | ICD-10-CM

## 2020-01-03 NOTE — Telephone Encounter (Signed)
Refill request

## 2020-01-19 ENCOUNTER — Ambulatory Visit (INDEPENDENT_AMBULATORY_CARE_PROVIDER_SITE_OTHER): Payer: Medicare Other | Admitting: Pulmonary Disease

## 2020-01-19 ENCOUNTER — Other Ambulatory Visit: Payer: Self-pay

## 2020-01-19 ENCOUNTER — Encounter: Payer: Self-pay | Admitting: Pulmonary Disease

## 2020-01-19 VITALS — BP 150/90 | HR 87 | Temp 97.6°F | Ht 68.5 in | Wt 180.2 lb

## 2020-01-19 DIAGNOSIS — J410 Simple chronic bronchitis: Secondary | ICD-10-CM

## 2020-01-19 DIAGNOSIS — J449 Chronic obstructive pulmonary disease, unspecified: Secondary | ICD-10-CM

## 2020-01-19 DIAGNOSIS — Z720 Tobacco use: Secondary | ICD-10-CM | POA: Diagnosis not present

## 2020-01-19 DIAGNOSIS — F17210 Nicotine dependence, cigarettes, uncomplicated: Secondary | ICD-10-CM | POA: Diagnosis not present

## 2020-01-19 DIAGNOSIS — C3411 Malignant neoplasm of upper lobe, right bronchus or lung: Secondary | ICD-10-CM | POA: Diagnosis not present

## 2020-01-19 DIAGNOSIS — F17200 Nicotine dependence, unspecified, uncomplicated: Secondary | ICD-10-CM

## 2020-01-19 MED ORDER — ALBUTEROL SULFATE HFA 108 (90 BASE) MCG/ACT IN AERS: INHALATION_SPRAY | RESPIRATORY_TRACT | 3 refills | Status: DC

## 2020-01-19 MED ORDER — TRELEGY ELLIPTA 100-62.5-25 MCG/INH IN AEPB: 1.0000 | INHALATION_SPRAY | Freq: Every day | RESPIRATORY_TRACT | 5 refills | Status: DC

## 2020-01-19 NOTE — Progress Notes (Signed)
Synopsis: Referred in Oct. 2019 for COPD by Jearld Fenton, NP  Subjective:   PATIENT ID: Joshua [name] GENDER: male DOB: 1955/06/06, MRN: [id number]  Chief Complaint  Patient presents with  . Follow-up    Patient feels good overall, no concerns    PMH of tobacco abuse, smoked since age of 23, at heaviest was 1ppd and now down to 0.5ppd. History of back surgery complicated by DVT. He has planned intervention by cardiology for his PVD on his right leg in two weeks.  He was treated with antibiotics and steroids recently by his primary care provider.  He is recently at the end of his steroid taper.  Presents to the office today with significant dyspnea on exertion, congestion, cough, sputum production as well as chest tightness and wheezing.  He has difficulty getting up and walking from his chair to the bathroom.  He states that he received a breathing treatment by his primary care doctor and felt much better for several hours after that.  He is still continuing to smoke and he knows that he needs to quit but he has been unable to stop. He has been incarcerated x2 each time for a little less than a year.  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  He denies IV drug use history.  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  OV 02/05/2018: Since the patient was last seen in the office he has been doing much better.  He has started using his trilogy inhaler regularly.  He has cut down smoking.  He is only smoking 3 to 4 cigarettes/day.  He is definitely trying to quit.  Since he was last seen in the office we also completed a low-dose lung cancer screening CT which was read as a lung RADS for a.  This had a enlarging right upper lobe cavitary/cystic lesion with areas of associated groundglass and semisolid material.  He does have dyspnea on exertion.  His daily cough is somewhat better.  He still has sputum production.  OV 03/02/2018: Since our last office  visit the patient was taken for bronchoscopy on 12/11, ENB/Radial EBUS s/p sampling of RUL lesion + for pulmonary adenocarcinoma, TTF-1 and Napsin positive.  Since he was last seen he also had pulmonary function tests completed which demonstrated an FEV1 60% predicted postbronchodilator, 2.0 L.  He has stopped smoking as of last week.  Patient denies hemoptysis.  He has been wheezing a little bit and using his nebulized treatments at home.  Today we discussed his biopsy results in the office with his wife as well as patient today in the office.  OV 03/25/2018: Patient seen today for follow-up after referral to [facility].  Patient was also seen by 1 of our nurse practitioners a few weeks ago.  His breathing is much better.  Of note he has quit smoking.  At this point he has been well managed on his triple therapy inhaler and as needed albuterol.  He is only needed his albuterol 2-3 times per day.  Overall no hemoptysis.  No additional wheezing.  He does feel like his functional status is better.  He was seen by Dr. Servando Snare for evaluation of lobectomy.  He was concerned due to his ongoing respiratory symptoms.  I do believe he has improved since he was last seen by me in clinic.  OV 09/16/2018: Patient seen today here in follow-up after receiving new diagnosis  of pulmonary adenocarcinoma back in December 2019.  Ultimately decided against lobectomy after concerns of surgery and evaluation by TCT S.  Patient underwent radiation therapy, by Dr. Lisbeth Renshaw.  Patient completed 57 Gy in 3 fractions via IMRT.  Today, overall doing well. Breathing is ok. Unfortunately, he went back to smoking. He would like to quit.  He does have daily sputum production.  He states this is better when he smokes less.  He understands correlation.  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 He is still smoking. Able to cut down to 0.5ppd. 40+ years.     Past  Medical History:  Diagnosis Date  . AC (acromioclavicular) joint bone spurs, unspecified laterality 11/25/2016  . Arthritis    "back" (05/16/2015)  . CAD in native artery, occluded Lcx and rotational atherectomy to LAD with DES 05/17/15 05/18/2015  . Cavitary lesion of lung 02/25/2018  . Chronic back pain    "all my back"  . Chronic bronchitis ([place])   . COPD (chronic obstructive pulmonary disease) ([place])   . Diabetes mellitus without complication ([place])   . GERD (gastroesophageal reflux disease)   . H/O blood clots    "had them in my back; put filter in before one of my neck ORs"  . History of gout   . Hyperlipidemia   . Hypertension   . Neuromuscular disorder ([place])   . PAD (peripheral artery disease) ([place])   . Phlebitis   . Positive TB test   . Seasonal allergies   . Shortness of breath   . Spinal disease      Family History  Problem Relation Age of Onset  . Stomach cancer Mother        Deceased, 16s  . Hypertension Mother   . Hypertension Father   . Stroke Father   . Heart attack Father        Deceased, 27  . Aneurysm Brother   . Healthy Daughter   . Healthy Maternal Grandmother   . Leukemia Grandchild   . Diabetes Neg Hx   . Early death Neg Hx   . Colon cancer Neg Hx   . Pancreatic cancer Neg Hx      Past Surgical History:  Procedure Laterality Date  . ABDOMINAL AORTOGRAM W/LOWER EXTREMITY  01/07/2018  . ABDOMINAL AORTOGRAM W/LOWER EXTREMITY N/A 01/07/2018   Procedure: ABDOMINAL AORTOGRAM W/LOWER EXTREMITY;  Surgeon: Wellington Hampshire, MD;  Location: [facility];  Service: Cardiovascular;  Laterality: N/A;  . ANTERIOR CERVICAL DECOMP/DISCECTOMY FUSION  ~ 2001-2009 X 3  . ANTERIOR CERVICAL DECOMP/DISCECTOMY FUSION N/A 10/06/2012   Procedure: ANTERIOR CERVICAL DECOMPRESSION/DISCECTOMY FUSION 1 LEVEL;  Surgeon: [name], MD;  Location: [facility];  Service: Neurosurgery;  Laterality: N/A;  C7T1 anterior cervical decompression with fusion plating and  bonegraft   . BACK SURGERY    . CARDIAC CATHETERIZATION  05/16/2015  . CARDIAC CATHETERIZATION N/A 05/16/2015   Procedure: Left Heart Cath and Coronary Angiography;  Surgeon: [name], MD;  Location: [facility];  Service: Cardiovascular;  Laterality: N/A;  . CARDIAC CATHETERIZATION N/A 05/16/2015   Procedure: Coronary Balloon Angioplasty;  Surgeon: [name], MD;  Location: [facility];  Service: Cardiovascular;  Laterality: N/A;  . CARDIAC CATHETERIZATION N/A 05/17/2015   Procedure: Coronary Stent Intervention Rotoblater;  Surgeon: [name], MD;  Location: [facility];  Service: Cardiovascular;  Laterality: N/A;  .  ELBOW SURGERY Left 2016   "had bone pieces removed"  . ESOPHAGEAL MANOMETRY N/A 12/30/2016   Procedure: ESOPHAGEAL MANOMETRY (EM);  Surgeon: Irene Shipper, MD;  Location: [facility];  Service: Endoscopy;  Laterality: N/A;  . EXCISIONAL HEMORRHOIDECTOMY    . FEMORAL BYPASS  ~ 2010  . IR RADIOLOGIST EVAL & MGMT  12/17/2016  . IVC FILTER PLACEMENT ([facility])  2010   "had blood clots in my back"  . NECK SURGERY  2014   2008-2014  . PERIPHERAL VASCULAR ATHERECTOMY  01/07/2018   Procedure: PERIPHERAL VASCULAR ATHERECTOMY;  Surgeon: Wellington Hampshire, MD;  Location: [facility];  Service: Cardiovascular;;  . PERIPHERAL VASCULAR BALLOON ANGIOPLASTY  01/07/2018   Procedure: PERIPHERAL VASCULAR BALLOON ANGIOPLASTY;  Surgeon: Wellington Hampshire, MD;  Location: [facility];  Service: Cardiovascular;;  . PERIPHERAL VASCULAR CATHETERIZATION N/A 02/14/2016   Procedure: Abdominal Aortogram w/Lower Extremity;  Surgeon: Wellington Hampshire, MD;  Location: [facility];  Service: Cardiovascular;  Laterality: N/A;  . POSTERIOR LAMINECTOMY / DECOMPRESSION LUMBAR SPINE  2006   "bone spurs"  . VIDEO BRONCHOSCOPY WITH ENDOBRONCHIAL NAVIGATION Right 02/25/2018   Procedure: VIDEO BRONCHOSCOPY WITH ENDOBRONCHIAL NAVIGATION;  Surgeon: Garner Nash, DO;  Location: [facility];  Service: Thoracic;  Laterality: Right;  [name] VIDEO BRONCHOSCOPY WITH ENDOBRONCHIAL ULTRASOUND Right 02/25/2018   Procedure: VIDEO BRONCHOSCOPY WITH ENDOBRONCHIAL ULTRASOUND;  Surgeon: Garner Nash, DO;  Location: [facility];  Service: Thoracic;  Laterality: Right;    Social History   Socioeconomic History  . Marital status: Married    Spouse name: Not on file  . Number of children: Not on file  . Years of education: 7  . Highest education level: Not on file  Occupational History  . Occupation: Disabled    Employer: DISABLED  Tobacco Use  . Smoking status: Current Every Day Smoker    Packs/day: 0.50    Years: 49.00    Pack years: 24.50    Types: Cigarettes  . Smokeless tobacco: Never Used  . Tobacco comment: half a pack a day   Vaping Use  . Vaping Use: Never used  Substance and Sexual Activity  . Alcohol use: Yes    Alcohol/week: 0.0 standard drinks    Comment: 2 drinks a week  . Drug use: No  . Sexual activity: Not Currently  Other Topics Concern  . Not on file  Social History Narrative   Regular exercise-no   Caffeine Use-yes   Lives with wife in a one story home. Has 2 children.  On disability for low back pain.  Used to work as a Games developer, lasted worked in 2007.     Education: 7th grade.   Social Determinants of Health   Financial Resource Strain:   . Difficulty of Paying Living Expenses: Not on file  Food Insecurity:   . Worried About Charity fundraiser in the Last Year: Not on file  . Ran Out of Food in the Last Year: Not on file  Transportation Needs:   . Lack of Transportation (Medical): Not on file  . Lack of Transportation (Non-Medical): Not on file  Physical Activity:   . Days of Exercise per Week: Not on file  . Minutes of Exercise per Session: Not on file  Stress:   . Feeling of Stress : Not on file  Social Connections:   . Frequency of Communication with Friends and Family: Not on file  . Frequency of Social Gatherings  with Friends and Family: Not on file  . Attends Religious Services: Not on file  . Active Member of Clubs or Organizations: Not on file  . Attends Archivist Meetings: Not on file  . Marital Status: Not on file  Intimate Partner Violence:   . Fear of Current or Ex-Partner: Not on file  . Emotionally Abused: Not on file  . Physically Abused: Not on file  . Sexually Abused: Not on file     Allergies  Allergen Reactions  . Lisinopril Hives and Itching  . Amitriptyline Other (See Comments)    Makes pt feel "weird" and nausea.  . Contrast Media [Iodinated Diagnostic Agents] Hives and Itching    Pt can use if taking benadryl   . Ibuprofen Other (See Comments)    Internal bleeding     Outpatient Medications Prior to Visit  Medication Sig Dispense Refill  . albuterol (PROAIR HFA) 108 (90 Base) MCG/ACT inhaler USE 2 PUFFS EVERY 6 HOURS  AS NEEDED FOR WHEEZING OR  SHORTNESS OF BREATH 34 g 3  . albuterol (PROVENTIL) (2.5 MG/3ML) 0.083% nebulizer solution Take 3 mLs (2.5 mg total) by nebulization every 6 (six) hours as needed for wheezing or shortness of breath. 75 mL 5  . aspirin EC 81 MG tablet Take 81 mg by mouth daily.    [name] atorvastatin (LIPITOR) 40 MG tablet Take 1 tablet (40 mg total) by mouth daily. <PLEASE MAKE APPOINTMENT FOR REFILLS> 30 tablet 0  . Blood Glucose Monitoring Suppl (ONETOUCH VERIO) w/Device KIT 1 Device by Does not apply route once. 1 kit 0  . clopidogrel (PLAVIX) 75 MG tablet Take 1 tablet (75 mg total) by mouth daily. <PLEASE MAKE APPOINTMENT FOR REFILLS> 30 tablet 0  . colchicine 0.6 MG tablet TAKE 2 TABLETS BY MOUTH AS  DIRECTED THEN 1 TABLET  AFTER 1 HOUR. CAN TAKE 1  TABLET TWICE DAILY  THEREAFTER. 60 tablet 11  . cyclobenzaprine (FLEXERIL) 10 MG tablet Take 1 tablet (10 mg total) by mouth 3 (three) times daily as needed for muscle spasms. 30 tablet 0  . diphenhydrAMINE (BENADRYL) 50 MG tablet Take 0.5 tablets (25 mg total) by mouth once. 1 hr prior to CT  scan 30 tablet 0  . fexofenadine (ALLEGRA ALLERGY) 180 MG tablet Take 1 tablet (180 mg total) by mouth daily. 90 tablet 3  . Fluticasone-Umeclidin-Vilant (TRELEGY ELLIPTA) 100-62.5-25 MCG/INH AEPB Inhale 1 puff into the lungs daily. 2 each 5  . losartan (COZAAR) 25 MG tablet TAKE 1 TABLET BY MOUTH  DAILY 90 tablet 2  . nitroGLYCERIN (NITROSTAT) 0.4 MG SL tablet Place 1 tablet (0.4 mg total) under the tongue every 5 (five) minutes as needed for chest pain. 25 tablet 4  . Omega-3 Fatty Acids (FISH OIL) 1000 MG CAPS Take 1,000 mg by mouth daily.     [name] omeprazole (PRILOSEC) 40 MG capsule Take 1 capsule (40 mg total) by mouth daily. 90 capsule 4  . ONE TOUCH LANCETS MISC 1 each by Does not apply route 3 (three) times daily. 200 each 5  . ONETOUCH ULTRA test strip USE TO TEST 3 TIMES DAILY 300 strip 3  . OVER THE COUNTER MEDICATION Apply 1 application topically daily as needed (neuropathy pain). Hemp oil cream    . predniSONE (DELTASONE) 50 MG tablet Take 1 tablet 13 hours, 7 hours and 1 hour prior to CT scan. 3 tablet 0  . traZODone (DESYREL) 100 MG tablet TAKE 1 TABLET BY MOUTH AT  BEDTIME  90 tablet 2   No facility-administered medications prior to visit.    Review of Systems  Constitutional: Negative for chills, fever, malaise/fatigue and weight loss.  HENT: Negative for hearing loss, sore throat and tinnitus.   Eyes: Negative for blurred vision and double vision.  Respiratory: Positive for cough and sputum production. Negative for hemoptysis, shortness of breath, wheezing and stridor.   Cardiovascular: Negative for chest pain, palpitations, orthopnea, leg swelling and PND.  Gastrointestinal: Negative for abdominal pain, constipation, diarrhea, heartburn, nausea and vomiting.  Genitourinary: Negative for dysuria, hematuria and urgency.  Musculoskeletal: Negative for joint pain and myalgias.  Skin: Negative for itching and rash.  Neurological: Negative for dizziness, tingling, weakness and  headaches.  Endo/Heme/Allergies: Negative for environmental allergies. Does not bruise/bleed easily.  Psychiatric/Behavioral: Negative for depression. The patient is not nervous/anxious and does not have insomnia.   All other systems reviewed and are negative.    Objective:  Physical Exam Vitals reviewed.  Constitutional:      General: He is not in acute distress.    Appearance: He is well-developed.  HENT:     Head: Normocephalic and atraumatic.     Mouth/Throat:     Pharynx: No oropharyngeal exudate.  Eyes:     Conjunctiva/sclera: Conjunctivae normal.     Pupils: Pupils are equal, round, and reactive to light.  Neck:     Vascular: No JVD.     Trachea: No tracheal deviation.     Comments: Loss of supraclavicular fat Cardiovascular:     Rate and Rhythm: Normal rate and regular rhythm.     Heart sounds: S1 normal and S2 normal.     Comments: Distant heart tones Pulmonary:     Effort: No tachypnea or accessory muscle usage.     Breath sounds: No stridor. Decreased breath sounds (throughout all lung fields) and rhonchi present. No wheezing or rales.  Abdominal:     General: Bowel sounds are normal. There is no distension.     Palpations: Abdomen is soft.     Tenderness: There is no abdominal tenderness.  Musculoskeletal:        General: Deformity (muscle wasting ) present.  Skin:    General: Skin is warm and dry.     Capillary Refill: Capillary refill takes less than 2 seconds.     Findings: No rash.  Neurological:     Mental Status: He is alert and oriented to person, place, and time.  Psychiatric:        Behavior: Behavior normal.      Vitals:   01/19/20 0859  BP: (!) 150/90  Pulse: 87  Temp: 97.6 F (36.4 C)  TempSrc: Temporal  SpO2: 96%  Weight: 180 lb 3.2 oz (81.7 kg)  Height: 5' 8.5" (1.74 m)   96% on RA BMI Readings from Last 3 Encounters:  01/19/20 27.00 kg/m  10/27/19 26.30 kg/m  10/13/19 25.99 kg/m   Wt Readings from Last 3 Encounters:    01/19/20 180 lb 3.2 oz (81.7 kg)  10/27/19 175 lb 8 oz (79.6 kg)  10/13/19 176 lb (79.8 kg)     CBC    Component Value Date/Time   WBC 9.9 10/13/2019 1517   RBC 4.39 10/13/2019 1517   HGB 15.8 10/13/2019 1517   HGB 16.4 01/02/2018 0927   HGB 19.2 (H) 11/25/2016 1430   HCT 46.3 10/13/2019 1517   HCT 46.2 01/02/2018 0927   HCT 54.8 (H) 11/25/2016 1430   PLT 250.0 10/13/2019 1517   PLT  269 01/02/2018 0927   MCV 105.5 (H) 10/13/2019 1517   MCV 97 01/02/2018 0927   MCV 92.3 11/25/2016 1430   MCH 33.4 (H) 07/09/2019 1435   MCHC 34.1 10/13/2019 1517   RDW 15.4 10/13/2019 1517   RDW 13.9 01/02/2018 0927   RDW 15.0 (H) 11/25/2016 1430   LYMPHSABS 2.2 10/13/2019 1517   LYMPHSABS 2.5 11/25/2016 1430   MONOABS 1.1 (H) 10/13/2019 1517   MONOABS 1.3 (H) 11/25/2016 1430   EOSABS 0.2 10/13/2019 1517   EOSABS 0.3 11/25/2016 1430   BASOSABS 0.2 (H) 10/13/2019 1517   BASOSABS 0.0 11/25/2016 1430    Chest Imaging: 2018 CT chest: Right lower lobe 6 mm pulmonary nodule stable from 2014 2018 on prior imaging.  02/16/2018 super D CT chest: Right upper lobe cystic lesion. The patient's images have been independently reviewed by me.    CT chest 08/06/2019: This was for follow-up post radiation therapy.  Still has a bandlike consolidation where the previous cavitary lesion was present in the anterior right upper lobe.  Likely related to postradiation changes.  Areas of bronchial thickening and emphysema. The patient's images have been independently reviewed by me.    Pulmonary Functions Testing Results:  PFT Results Latest Ref Rng & Units 02/05/2018  FVC-Pre L 3.05  FVC-Predicted Pre % 66  FVC-Post L 3.30  FVC-Predicted Post % 72  Pre FEV1/FVC % % 58  Post FEV1/FCV % % 63  FEV1-Pre L 1.75  FEV1-Predicted Pre % 50  FEV1-Post L 2.09  DLCO uncorrected ml/min/mmHg 23.38  DLCO UNC% % 75  DLVA Predicted % 82  TLC L 7.59  TLC % Predicted % 111  RV % Predicted % 173     FeNO:  None  Pathology:   Bronchoscopy: 02/25/2018 ENB/Radial EBUS  + RUL NSCLC, Adenocarcinoma   Echocardiogram: None   Heart Catheterization:   Prox LAD to Mid LAD lesion, 90% stenosed. Post intervention, there remains 80% residual calcific stenosis.  Mid LAD lesion, 80% stenosed. Dist LAD lesion, 55% stenosed.  Prox Cx lesion, 100% stenosed. Prox Cx to Mid Cx lesion, 99% stenosed. Ost 2nd Mrg to 2nd Mrg lesion, 99% stenosed. -- Seen via retrograde filling from LAD/diagonal-OM collaterals  There is hyperdynamic left ventricular systolic function.  Elevated LVEDP    Assessment & Plan:      ICD-10-CM   1. Moderate COPD (chronic obstructive pulmonary disease) (HCC)  J44.9   2. Malignant neoplasm of upper lobe of right lung (HCC)  C34.11   3. Current smoker  F17.200   4. Tobacco use  Z72.0   5. Simple chronic bronchitis ([place])  J41.0     Discussion:  This is a 64 year old gentleman history of right upper lobe lung cancer adenocarcinoma status post SBRT.  She had a CT scan of the chest in May which showed stability and remaining presents/scarring from the radiation.  He has a repeat noncontrasted CT planned at the end of this month.  Unfortunately he is still smoking.  He does have ongoing sputum production.  Patient has continued chronic sputum production.  Consistent with chronic bronchitis.  Plan: Refills today of Trelegy. Patient was encouraged to continue to use his maintenance inhalers daily. Patient was counseled on smoking cessation. Refills for albuterol to be used as needed for shortness of breath and wheezing. Patient counseled on exercise and weight loss.  Smoking Cessation Counseling:   The patient's current tobacco use: 0.5 ppd, 40+ years, at his max was 1ppd  The patient was  advised to quit and impact of smoking on their health.  I assessed the patient's willingness to attempt to quit. I provided methods and skills for cessation. We reviewed medication management  of smoking session drugs if appropriate. Resources to help quit smoking were provided. A smoking cessation quit date was set: Jan 1st 2022 Follow-up was arranged in our clinic.  The amount of time spent counseling patient was 12 mins.      Current Outpatient Medications:  .  albuterol (PROAIR HFA) 108 (90 Base) MCG/ACT inhaler, USE 2 PUFFS EVERY 6 HOURS  AS NEEDED FOR WHEEZING OR  SHORTNESS OF BREATH, Disp: 34 g, Rfl: 3 .  albuterol (PROVENTIL) (2.5 MG/3ML) 0.083% nebulizer solution, Take 3 mLs (2.5 mg total) by nebulization every 6 (six) hours as needed for wheezing or shortness of breath., Disp: 75 mL, Rfl: 5 .  aspirin EC 81 MG tablet, Take 81 mg by mouth daily., Disp: , Rfl:  .  atorvastatin (LIPITOR) 40 MG tablet, Take 1 tablet (40 mg total) by mouth daily. <PLEASE MAKE APPOINTMENT FOR REFILLS>, Disp: 30 tablet, Rfl: 0 .  Blood Glucose Monitoring Suppl (ONETOUCH VERIO) w/Device KIT, 1 Device by Does not apply route once., Disp: 1 kit, Rfl: 0 .  clopidogrel (PLAVIX) 75 MG tablet, Take 1 tablet (75 mg total) by mouth daily. <PLEASE MAKE APPOINTMENT FOR REFILLS>, Disp: 30 tablet, Rfl: 0 .  colchicine 0.6 MG tablet, TAKE 2 TABLETS BY MOUTH AS  DIRECTED THEN 1 TABLET  AFTER 1 HOUR. CAN TAKE 1  TABLET TWICE DAILY  THEREAFTER., Disp: 60 tablet, Rfl: 11 .  cyclobenzaprine (FLEXERIL) 10 MG tablet, Take 1 tablet (10 mg total) by mouth 3 (three) times daily as needed for muscle spasms., Disp: 30 tablet, Rfl: 0 .  diphenhydrAMINE (BENADRYL) 50 MG tablet, Take 0.5 tablets (25 mg total) by mouth once. 1 hr prior to CT scan, Disp: 30 tablet, Rfl: 0 .  fexofenadine (ALLEGRA ALLERGY) 180 MG tablet, Take 1 tablet (180 mg total) by mouth daily., Disp: 90 tablet, Rfl: 3 .  Fluticasone-Umeclidin-Vilant (TRELEGY ELLIPTA) 100-62.5-25 MCG/INH AEPB, Inhale 1 puff into the lungs daily., Disp: 2 each, Rfl: 5 .  losartan (COZAAR) 25 MG tablet, TAKE 1 TABLET BY MOUTH  DAILY, Disp: 90 tablet, Rfl: 2 .  nitroGLYCERIN  (NITROSTAT) 0.4 MG SL tablet, Place 1 tablet (0.4 mg total) under the tongue every 5 (five) minutes as needed for chest pain., Disp: 25 tablet, Rfl: 4 .  Omega-3 Fatty Acids (FISH OIL) 1000 MG CAPS, Take 1,000 mg by mouth daily. , Disp: , Rfl:  .  omeprazole (PRILOSEC) 40 MG capsule, Take 1 capsule (40 mg total) by mouth daily., Disp: 90 capsule, Rfl: 4 .  ONE TOUCH LANCETS MISC, 1 each by Does not apply route 3 (three) times daily., Disp: 200 each, Rfl: 5 .  ONETOUCH ULTRA test strip, USE TO TEST 3 TIMES DAILY, Disp: 300 strip, Rfl: 3 .  OVER THE COUNTER MEDICATION, Apply 1 application topically daily as needed (neuropathy pain). Hemp oil cream, Disp: , Rfl:  .  predniSONE (DELTASONE) 50 MG tablet, Take 1 tablet 13 hours, 7 hours and 1 hour prior to CT scan., Disp: 3 tablet, Rfl: 0 .  traZODone (DESYREL) 100 MG tablet, TAKE 1 TABLET BY MOUTH AT  BEDTIME, Disp: 90 tablet, Rfl: 2    Garner Nash, DO [facility] 01/19/2020 9:16 AM ]

## 2020-01-19 NOTE — Patient Instructions (Addendum)
Thank you for visiting Dr. Valeta Harms at [facility]. Today we recommend the following:  Meds ordered this encounter  Medications  . Fluticasone-Umeclidin-Vilant (TRELEGY ELLIPTA) 100-62.5-25 MCG/INH AEPB    Sig: Inhale 1 puff into the lungs daily.    Dispense:  2 each    Refill:  5    Order Specific Question:   Lot Number?    Answer:   [id number]    Order Specific Question:   Expiration Date?    Answer:   05/09/2019    Order Specific Question:   Manufacturer?    Answer:   [employer] [[id number]]    Order Specific Question:   Quantity    Answer:   1  . albuterol (PROAIR HFA) 108 (90 Base) MCG/ACT inhaler    Sig: USE 2 PUFFS EVERY 6 HOURS  AS NEEDED FOR WHEEZING OR  SHORTNESS OF BREATH    Dispense:  34 g    Refill:  3   Return in about 3 months (around 04/20/2020) for with APP or Dr. Valeta Harms.    Please do your part to reduce the spread of COVID-19.    You must quit smoking or vaping. This is the single most important thing that you can do to improve your lung health.   S = Set a quit date. T = Tell family, friends, and the people around you that you plan to quit. A = Anticipate or plan ahead for the tough times you'll face while quitting. R = Remove cigarettes and other tobacco products from your home, car, and work T = Talk to [place] about getting help to quit  If you need help feel free to reach out to our office, [place] [facility]: [phone], call 1-800-QUIT-NOW, or visit [email or web address]

## 2020-02-01 ENCOUNTER — Other Ambulatory Visit: Payer: Self-pay | Admitting: Cardiovascular Disease

## 2020-02-01 DIAGNOSIS — Z79899 Other long term (current) drug therapy: Secondary | ICD-10-CM

## 2020-02-01 DIAGNOSIS — I739 Peripheral vascular disease, unspecified: Secondary | ICD-10-CM

## 2020-02-01 DIAGNOSIS — I10 Essential (primary) hypertension: Secondary | ICD-10-CM

## 2020-02-01 NOTE — Telephone Encounter (Signed)
Refill request

## 2020-02-09 ENCOUNTER — Ambulatory Visit (HOSPITAL_COMMUNITY): Payer: Medicare Other

## 2020-02-09 ENCOUNTER — Telehealth: Payer: Self-pay | Admitting: *Deleted

## 2020-02-09 NOTE — Telephone Encounter (Signed)
CALLED PATIENT TO ASK ABOUT MISSING SCAN , LVM TO RESCHEDULE SCAN AND FU

## 2020-02-14 ENCOUNTER — Ambulatory Visit: Payer: Medicare Other | Admitting: Radiation Oncology

## 2020-04-11 ENCOUNTER — Telehealth: Payer: Self-pay | Admitting: *Deleted

## 2020-04-11 NOTE — Telephone Encounter (Signed)
CALLED PATIENT TO ASK WHEN HE WOULD BE WILLING TO DO A SCAN, LVM FOR A RETURN CALL

## 2020-05-11 ENCOUNTER — Ambulatory Visit (INDEPENDENT_AMBULATORY_CARE_PROVIDER_SITE_OTHER): Payer: Medicare Other | Admitting: Family Medicine

## 2020-05-11 ENCOUNTER — Telehealth: Payer: Self-pay | Admitting: *Deleted

## 2020-05-11 ENCOUNTER — Other Ambulatory Visit: Payer: Self-pay

## 2020-05-11 ENCOUNTER — Encounter: Payer: Self-pay | Admitting: Family Medicine

## 2020-05-11 VITALS — BP 130/90 | HR 82 | Temp 97.8°F | Ht 68.5 in | Wt 185.5 lb

## 2020-05-11 DIAGNOSIS — B028 Zoster with other complications: Secondary | ICD-10-CM

## 2020-05-11 MED ORDER — GABAPENTIN 300 MG PO CAPS
300.0000 mg | ORAL_CAPSULE | Freq: Three times a day (TID) | ORAL | 3 refills | Status: DC
Start: 2020-05-11 — End: 2020-11-22

## 2020-05-11 MED ORDER — VALACYCLOVIR HCL 1 G PO TABS
1000.0000 mg | ORAL_TABLET | Freq: Three times a day (TID) | ORAL | 0 refills | Status: AC
Start: 2020-05-11 — End: 2020-05-18

## 2020-05-11 NOTE — Patient Instructions (Signed)
Generic Gabapentin Titration Schedule  Generic Gabapentin (generic form of Neurontin) comes in 300 mg tablets or capsules.   You have to titrate your dose slowly to reduce side effects and reduce sedation / sleepiness.    Week               Breakfast  Lunch   Dinner One                 0   0   300 mg Two   300mg    0   300mg  Three   300mg    300mg    300mg .

## 2020-05-11 NOTE — Progress Notes (Signed)
Shemika Robbs T. Karyss Frese, MD, [place] at [facility] [place], [address]  Phone: [phone]  FAX: [place] - 65 y.o. male  MRN [id number]  Date of Birth: 08/25/1955  Date: 05/11/2020  PCP: Jearld Fenton, NP  Referral: Jearld Fenton, NP  Chief Complaint  Patient presents with  . Neck Pain    Comes and Go-Makes his eye feel weird    This visit occurred during the SARS-CoV-2 public health emergency.  Safety protocols were in place, including screening questions prior to the visit, additional usage of staff PPE, and extensive cleaning of exam room while observing appropriate contact time as indicated for disinfecting solutions.   Subjective:   BON DOWIS is a 65 y.o. very pleasant male patient with Body mass index is 27.79 kg/m. who presents with the following:  Pain really bad and it will go and come.  Hs been bothering.  This is been ongoing for a few days, definitely less than a week.  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  He does not feel like this feels in any way like these.  He also does have an outbreak on the back of his neck of some vesicular type lesions.  Has had 4 surgeries.   3 surgeries - at [facility]  Lab Results  Component Value Date   HGBA1C 5.3 07/09/2019    Motion bad    Review of Systems is noted in the HPI, as appropriate   Objective:   BP 130/90   Pulse 82   Temp 97.8 F (36.6 C) (Temporal)   Ht 5' 8.5" (1.74 m)   Wt 185 lb 8 oz (84.1 kg)   SpO2 96%   BMI 27.79 kg/m       Radiology: No results found.  Assessment and Plan:     ICD-10-CM   1. Herpes zoster with complication  U82.8    He is  having some pretty severe pain, classic appearance of shingles.  I am going to start him on Valtrex and also titrate him off on gabapentin as much as he can tolerate.  Hopefully this will help with the symptoms.  If his shingles does start to move towards his eye, he does understand that this would be an ophthalmological emergency.  Patient Instructions  Generic Gabapentin Titration Schedule  Generic Gabapentin (generic form of Neurontin) comes in 300 mg tablets or capsules.   You have to titrate your dose slowly to reduce side effects and reduce sedation / sleepiness.    Week               Breakfast  Lunch   Dinner One                 0   0   300 mg Two   332m   0   3090mThree   300101m 300m14m300mg48m  Meds ordered this encounter  Medications  . valACYclovir (VALTREX) 1000 MG tablet    Sig: Take 1 tablet (1,000 mg total) by mouth 3 (three) times daily for 7 days.    Dispense:  21 tablet  Refill:  0  . gabapentin (NEURONTIN) 300 MG capsule    Sig: Take 1 capsule (300 mg total) by mouth 3 (three) times daily.    Dispense:  90 capsule    Refill:  3   Signed,  Dann Galicia T. Rastus Borton, MD   Outpatient Encounter Medications as of 05/11/2020  Medication Sig  . albuterol (PROAIR HFA) 108 (90 Base) MCG/ACT inhaler USE 2 PUFFS EVERY 6 HOURS  AS NEEDED FOR WHEEZING OR  SHORTNESS OF BREATH  . albuterol (PROVENTIL) (2.5 MG/3ML) 0.083% nebulizer solution Take 3 mLs (2.5 mg total) by nebulization every 6 (six) hours as needed for wheezing or shortness of breath.  [name] aspirin EC 81 MG tablet Take 81 mg by mouth daily.  [name] atorvastatin (LIPITOR) 40 MG tablet TAKE 1 TABLET BY MOUTH  DAILY. &lt;PLEASE MAKE  APPOINTMENT FOR REFILLS&gt;  . Blood Glucose Monitoring Suppl (ONETOUCH VERIO) w/Device KIT 1 Device by Does not apply route once.  . clopidogrel (PLAVIX) 75 MG tablet TAKE 1 TABLET BY MOUTH  DAILY. &lt;PLEASE MAKE  APPOINTMENT FOR REFILLS&gt;  . colchicine 0.6 MG tablet TAKE 2 TABLETS BY  MOUTH AS  DIRECTED THEN 1 TABLET  AFTER 1 HOUR. CAN TAKE 1  TABLET TWICE DAILY  THEREAFTER.  . cyclobenzaprine (FLEXERIL) 10 MG tablet Take 1 tablet (10 mg total) by mouth 3 (three) times daily as needed for muscle spasms.  . fexofenadine (ALLEGRA ALLERGY) 180 MG tablet Take 1 tablet (180 mg total) by mouth daily.  . Fluticasone-Umeclidin-Vilant (TRELEGY ELLIPTA) 100-62.5-25 MCG/INH AEPB Inhale 1 puff into the lungs daily.  [name] gabapentin (NEURONTIN) 300 MG capsule Take 1 capsule (300 mg total) by mouth 3 (three) times daily.  [name] losartan (COZAAR) 25 MG tablet TAKE 1 TABLET BY MOUTH  DAILY  . nitroGLYCERIN (NITROSTAT) 0.4 MG SL tablet Place 1 tablet (0.4 mg total) under the tongue every 5 (five) minutes as needed for chest pain.  . Omega-3 Fatty Acids (FISH OIL) 1000 MG CAPS Take 1,000 mg by mouth daily.   [name] omeprazole (PRILOSEC) 40 MG capsule Take 1 capsule (40 mg total) by mouth daily.  . ONE TOUCH LANCETS MISC 1 each by Does not apply route 3 (three) times daily.  Glory Rosebush ULTRA test strip USE TO TEST 3 TIMES DAILY  . OVER THE COUNTER MEDICATION Apply 1 application topically daily as needed (neuropathy pain). Hemp oil cream  . traZODone (DESYREL) 100 MG tablet TAKE 1 TABLET BY MOUTH AT  BEDTIME  . valACYclovir (VALTREX) 1000 MG tablet Take 1 tablet (1,000 mg total) by mouth 3 (three) times daily for 7 days.  . diphenhydrAMINE (BENADRYL) 50 MG tablet Take 0.5 tablets (25 mg total) by mouth once. 1 hr prior to CT scan  . [DISCONTINUED] predniSONE (DELTASONE) 50 MG tablet Take 1 tablet 13 hours, 7 hours and 1 hour prior to CT scan.   No facility-administered encounter medications on file as of 05/11/2020.

## 2020-05-11 NOTE — Telephone Encounter (Signed)
CALLED PATIENT TO INFORM OF CT FOR 05-19-20 - ARRIVAL TIME- 5:15 PM @ [facility], NO RESTRICTIONS TO TEST, PATIENT TO FU FOR RESULTS WITH ALISON PERKINS ON 05-22-20 @ 3 PM FOR RESULTS VIA TELEPHONE, LVM FOR A RETURN CALL

## 2020-05-12 ENCOUNTER — Other Ambulatory Visit: Payer: Self-pay

## 2020-05-12 DIAGNOSIS — I739 Peripheral vascular disease, unspecified: Secondary | ICD-10-CM

## 2020-05-12 DIAGNOSIS — Z79899 Other long term (current) drug therapy: Secondary | ICD-10-CM

## 2020-05-12 DIAGNOSIS — I10 Essential (primary) hypertension: Secondary | ICD-10-CM

## 2020-05-12 MED ORDER — CLOPIDOGREL BISULFATE 75 MG PO TABS: 75.0000 mg | ORAL_TABLET | Freq: Every day | ORAL | 0 refills | Status: DC

## 2020-05-15 ENCOUNTER — Telehealth: Payer: Self-pay

## 2020-05-15 NOTE — Telephone Encounter (Signed)
Left patient voicemail message in regards to telephone visit appointment with Shona Simpson PA on 05/22/20 @ 3:00pm. Called to review meaningful use questions. TM

## 2020-05-17 ENCOUNTER — Telehealth: Payer: Self-pay

## 2020-05-17 NOTE — Telephone Encounter (Signed)
Left patient voicemail message in regards to telephone appointment with Shona Simpson PA on 05/22/20 @ 3:00pm. Called to review meaningful use questions. TM

## 2020-05-19 ENCOUNTER — Other Ambulatory Visit: Payer: Self-pay

## 2020-05-19 ENCOUNTER — Encounter (HOSPITAL_COMMUNITY): Payer: Self-pay

## 2020-05-19 ENCOUNTER — Ambulatory Visit (HOSPITAL_COMMUNITY)
Admission: RE | Admit: 2020-05-19 | Discharge: 2020-05-19 | Disposition: A | Discharge status: Home / Self Care | Payer: Medicare Other | Source: Ambulatory Visit | Attending: Radiation Oncology | Admitting: Radiation Oncology

## 2020-05-19 DIAGNOSIS — C3411 Malignant neoplasm of upper lobe, right bronchus or lung: Secondary | ICD-10-CM

## 2020-05-22 ENCOUNTER — Telehealth: Payer: Self-pay | Admitting: *Deleted

## 2020-05-22 ENCOUNTER — Ambulatory Visit: Admission: RE | Admit: 2020-05-22 | Payer: Medicare Other | Source: Ambulatory Visit | Admitting: Radiation Oncology

## 2020-05-22 NOTE — Telephone Encounter (Signed)
Called patient to ask about rescheduling missed scan, lvm for a return call

## 2020-05-23 ENCOUNTER — Telehealth: Payer: Self-pay | Admitting: *Deleted

## 2020-05-23 NOTE — Telephone Encounter (Signed)
CALLED PATIENT TO ASK ABOUT RESCHEDULING MISSED SCAN, LVM FOR A RETURN CALL

## 2020-05-24 ENCOUNTER — Other Ambulatory Visit: Payer: Self-pay | Admitting: Radiation Oncology

## 2020-05-24 ENCOUNTER — Telehealth: Payer: Self-pay | Admitting: *Deleted

## 2020-05-24 NOTE — Telephone Encounter (Signed)
CALLED PATIENT TO ASK ABOUT RESCHEDULING SCAN, LVM FOR A RETURN CALL

## 2020-05-25 ENCOUNTER — Telehealth: Payer: Self-pay | Admitting: *Deleted

## 2020-05-25 NOTE — Telephone Encounter (Signed)
CALLED PATIENT TO ASK ABOUT RESCHEDULING MISSED SCAN, LVM FOR A RETURN CALL

## 2020-05-29 ENCOUNTER — Telehealth: Payer: Self-pay | Admitting: Radiation Oncology

## 2020-05-29 NOTE — Telephone Encounter (Signed)
I called the patient to see if he was willing to try to reschedule his CT scan. He has either cancelled or moved his appointments multiple times.  I had to leave a message asking if he wanted to be followed by [place] since we've attempted more than 9 calls to him about his appointments. Hopefully he will call us back and reschedule.     Carola Rhine, PAC

## 2020-05-31 NOTE — Telephone Encounter (Signed)
Called and spoke with patient. He stated that he has been doing well and did not have any breathing complaints. I was able to get him scheduled for 07/25/20 at 9am (he preferred early appts since he has to be at work at [employer] ). I advised him that if he does develop any breathing concerns to please call our office. He verbalized understanding. Nothing further needed at time of call.

## 2020-05-31 NOTE — Telephone Encounter (Signed)
Yes that's fine. Or you can use a nodule slot and put him in for a 15 min Thanks Garner Nash, DO  Pulmonary Critical Care 05/31/2020 12:43 PM

## 2020-05-31 NOTE — Telephone Encounter (Signed)
Dr. Valeta Harms, I know you said next available in the previous message, but your next available isn't until May 2022. Will this still be ok for him?

## 2020-05-31 NOTE — Telephone Encounter (Signed)
Patrice,   Please see message below from [place]. Can we see if we can reach him to schedule a follow up appt with me? Next available/no rush.   Thanks  Garner Nash, DO [place] [facility] 05/31/2020 8:52 AM

## 2020-06-20 ENCOUNTER — Ambulatory Visit (INDEPENDENT_AMBULATORY_CARE_PROVIDER_SITE_OTHER): Payer: Medicare Other | Admitting: Internal Medicine

## 2020-06-20 ENCOUNTER — Encounter: Payer: Self-pay | Admitting: Internal Medicine

## 2020-06-20 ENCOUNTER — Other Ambulatory Visit: Payer: Self-pay

## 2020-06-20 VITALS — BP 130/82 | HR 79 | Temp 98.7°F | Wt 188.0 lb

## 2020-06-20 DIAGNOSIS — M542 Cervicalgia: Secondary | ICD-10-CM

## 2020-06-20 DIAGNOSIS — Z981 Arthrodesis status: Secondary | ICD-10-CM | POA: Diagnosis not present

## 2020-06-20 DIAGNOSIS — M25511 Pain in right shoulder: Secondary | ICD-10-CM

## 2020-06-20 DIAGNOSIS — M25411 Effusion, right shoulder: Secondary | ICD-10-CM

## 2020-06-20 DIAGNOSIS — G8929 Other chronic pain: Secondary | ICD-10-CM | POA: Diagnosis not present

## 2020-06-20 MED ORDER — TRAMADOL HCL 50 MG PO TABS: 50.0000 mg | ORAL_TABLET | Freq: Three times a day (TID) | ORAL | 0 refills | Status: AC | PRN

## 2020-06-20 MED ORDER — DEXAMETHASONE SODIUM PHOSPHATE 100 MG/10ML IJ SOLN
10.0000 mg | Freq: Once | INTRAMUSCULAR | Status: AC
Start: 2020-06-20 — End: 2020-06-20
  Administered 2020-06-20: 10 mg via INTRAMUSCULAR

## 2020-06-20 MED ORDER — CYCLOBENZAPRINE HCL 10 MG PO TABS: 10.0000 mg | ORAL_TABLET | Freq: Three times a day (TID) | ORAL | 0 refills | Status: DC | PRN

## 2020-06-20 NOTE — Patient Instructions (Signed)
Shoulder Exercises Ask your health care provider which exercises are safe for you. Do exercises exactly as told by your health care provider and adjust them as directed. It is normal to feel mild stretching, pulling, tightness, or discomfort as you do these exercises. Stop right away if you feel sudden pain or your pain gets worse. Do not begin these exercises until told by your health care provider. Stretching exercises External rotation and abduction This exercise is sometimes called corner stretch. This exercise rotates your arm outward (external rotation) and moves your arm out from your body (abduction). 1. Stand in a doorway with one of your feet slightly in front of the other. This is called a staggered stance. If you cannot reach your forearms to the door frame, stand facing a corner of a room. 2. Choose one of the following positions as told by your health care provider: ? Place your hands and forearms on the door frame above your head. ? Place your hands and forearms on the door frame at the height of your head. ? Place your hands on the door frame at the height of your elbows. 3. Slowly move your weight onto your front foot until you feel a stretch across your chest and in the front of your shoulders. Keep your head and chest upright and keep your abdominal muscles tight. 4. Hold for __________ seconds. 5. To release the stretch, shift your weight to your back foot. Repeat __________ times. Complete this exercise __________ times a day.   Extension, standing 1. Stand and hold a broomstick, a cane, or a similar object behind your back. ? Your hands should be a little wider than shoulder width apart. ? Your palms should face away from your back. 2. Keeping your elbows straight and your shoulder muscles relaxed, move the stick away from your body until you feel a stretch in your shoulders (extension). ? Avoid shrugging your shoulders while you move the stick. Keep your shoulder blades  tucked down toward the middle of your back. 3. Hold for __________ seconds. 4. Slowly return to the starting position. Repeat __________ times. Complete this exercise __________ times a day. Range-of-motion exercises Pendulum 1. Stand near a wall or a surface that you can hold onto for balance. 2. Bend at the waist and let your left / right arm hang straight down. Use your other arm to support you. Keep your back straight and do not lock your knees. 3. Relax your left / right arm and shoulder muscles, and move your hips and your trunk so your left / right arm swings freely. Your arm should swing because of the motion of your body, not because you are using your arm or shoulder muscles. 4. Keep moving your hips and trunk so your arm swings in the following directions, as told by your health care provider: ? Side to side. ? Forward and backward. ? In clockwise and counterclockwise circles. 5. Continue each motion for __________ seconds, or for as long as told by your health care provider. 6. Slowly return to the starting position. Repeat __________ times. Complete this exercise __________ times a day.   Shoulder flexion, standing 1. Stand and hold a broomstick, a cane, or a similar object. Place your hands a little more than shoulder width apart on the object. Your left / right hand should be palm up, and your other hand should be palm down. 2. Keep your elbow straight and your shoulder muscles relaxed. Push the stick up with your healthy  arm to raise your left / right arm in front of your body, and then over your head until you feel a stretch in your shoulder (flexion). ? Avoid shrugging your shoulder while you raise your arm. Keep your shoulder blade tucked down toward the middle of your back. 3. Hold for __________ seconds. 4. Slowly return to the starting position. Repeat __________ times. Complete this exercise __________ times a day.   Shoulder abduction, standing 1. Stand and hold a  broomstick, a cane, or a similar object. Place your hands a little more than shoulder width apart on the object. Your left / right hand should be palm up, and your other hand should be palm down. 2. Keep your elbow straight and your shoulder muscles relaxed. Push the object across your body toward your left / right side. Raise your left / right arm to the side of your body (abduction) until you feel a stretch in your shoulder. ? Do not raise your arm above shoulder height unless your health care provider tells you to do that. ? If directed, raise your arm over your head. ? Avoid shrugging your shoulder while you raise your arm. Keep your shoulder blade tucked down toward the middle of your back. 3. Hold for __________ seconds. 4. Slowly return to the starting position. Repeat __________ times. Complete this exercise __________ times a day. Internal rotation 1. Place your left / right hand behind your back, palm up. 2. Use your other hand to dangle an exercise band, a towel, or a similar object over your shoulder. Grasp the band with your left / right hand so you are holding on to both ends. 3. Gently pull up on the band until you feel a stretch in the front of your left / right shoulder. The movement of your arm toward the center of your body is called internal rotation. ? Avoid shrugging your shoulder while you raise your arm. Keep your shoulder blade tucked down toward the middle of your back. 4. Hold for __________ seconds. 5. Release the stretch by letting go of the band and lowering your hands. Repeat __________ times. Complete this exercise __________ times a day.   Strengthening exercises External rotation 1. Sit in a stable chair without armrests. 2. Secure an exercise band to a stable object at elbow height on your left / right side. 3. Place a soft object, such as a folded towel or a small pillow, between your left / right upper arm and your body to move your elbow about 4 inches (10  cm) away from your side. 4. Hold the end of the exercise band so it is tight and there is no slack. 5. Keeping your elbow pressed against the soft object, slowly move your forearm out, away from your abdomen (external rotation). Keep your body steady so only your forearm moves. 6. Hold for __________ seconds. 7. Slowly return to the starting position. Repeat __________ times. Complete this exercise __________ times a day.   Shoulder abduction 1. Sit in a stable chair without armrests, or stand up. 2. Hold a __________ weight in your left / right hand, or hold an exercise band with both hands. 3. Start with your arms straight down and your left / right palm facing in, toward your body. 4. Slowly lift your left / right hand out to your side (abduction). Do not lift your hand above shoulder height unless your health care provider tells you that this is safe. ? Keep your arms straight. ? Avoid  shrugging your shoulder while you do this movement. Keep your shoulder blade tucked down toward the middle of your back. 5. Hold for __________ seconds. 6. Slowly lower your arm, and return to the starting position. Repeat __________ times. Complete this exercise __________ times a day.   Shoulder extension 1. Sit in a stable chair without armrests, or stand up. 2. Secure an exercise band to a stable object in front of you so it is at shoulder height. 3. Hold one end of the exercise band in each hand. Your palms should face each other. 4. Straighten your elbows and lift your hands up to shoulder height. 5. Step back, away from the secured end of the exercise band, until the band is tight and there is no slack. 6. Squeeze your shoulder blades together as you pull your hands down to the sides of your thighs (extension). Stop when your hands are straight down by your sides. Do not let your hands go behind your body. 7. Hold for __________ seconds. 8. Slowly return to the starting position. Repeat __________  times. Complete this exercise __________ times a day. Shoulder row 1. Sit in a stable chair without armrests, or stand up. 2. Secure an exercise band to a stable object in front of you so it is at waist height. 3. Hold one end of the exercise band in each hand. Position your palms so that your thumbs are facing the ceiling (neutral position). 4. Bend each of your elbows to a 90-degree angle (right angle) and keep your upper arms at your sides. 5. Step back until the band is tight and there is no slack. 6. Slowly pull your elbows back behind you. 7. Hold for __________ seconds. 8. Slowly return to the starting position. Repeat __________ times. Complete this exercise __________ times a day. Shoulder press-ups 1. Sit in a stable chair that has armrests. Sit upright, with your feet flat on the floor. 2. Put your hands on the armrests so your elbows are bent and your fingers are pointing forward. Your hands should be about even with the sides of your body. 3. Push down on the armrests and use your arms to lift yourself off the chair. Straighten your elbows and lift yourself up as much as you comfortably can. ? Move your shoulder blades down, and avoid letting your shoulders move up toward your ears. ? Keep your feet on the ground. As you get stronger, your feet should support less of your body weight as you lift yourself up. 4. Hold for __________ seconds. 5. Slowly lower yourself back into the chair. Repeat __________ times. Complete this exercise __________ times a day.   Wall push-ups 1. Stand so you are facing a stable wall. Your feet should be about one arm-length away from the wall. 2. Lean forward and place your palms on the wall at shoulder height. 3. Keep your feet flat on the floor as you bend your elbows and lean forward toward the wall. 4. Hold for __________ seconds. 5. Straighten your elbows to push yourself back to the starting position. Repeat __________ times. Complete this  exercise __________ times a day.   This information is not intended to replace advice given to you by your health care provider. Make sure you discuss any questions you have with your health care provider. Document Revised: 06/26/2018 Document Reviewed: 04/03/2018 Elsevier Patient Education  2021 [employer].

## 2020-06-20 NOTE — Addendum Note (Signed)
Addended by: Lurlean Nanny on: 06/20/2020 05:17 PM   Modules accepted: Orders

## 2020-06-20 NOTE — Progress Notes (Signed)
Subjective:    Patient ID: Joshua [name], male    DOB: Mar 30, 1955, 65 y.o.   MRN: [id number]  HPI  Pt presents to the clinic today with c/o right shoulder pain. This started after a fall 09/2019 but seems to be consistently getting worse. He describes the pain as sharp and burning. The pain radiates into his right elbow, right side of neck and right shoulder blade. He does have weakness in his right arm. 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  He had a normal xray of his right shoulder 09/2019. He has tried Aspercream with minimal relief. He has tried ice and heat but neither one of these help. He reports he was on Prednisone for 7 days recently, and this did not seem to help.  Review of Systems      Past Medical History:  Diagnosis Date  . AC (acromioclavicular) joint bone spurs, unspecified laterality 11/25/2016  . Arthritis    "back" (05/16/2015)  . CAD in native artery, occluded Lcx and rotational atherectomy to LAD with DES 05/17/15 05/18/2015  . Cavitary lesion of lung 02/25/2018  . Chronic back pain    "all my back"  . Chronic bronchitis ([place])   . COPD (chronic obstructive pulmonary disease) ([place])   . Diabetes mellitus without complication ([place])   . GERD (gastroesophageal reflux disease)   . H/O blood clots    "had them in my back; put filter in before one of my neck ORs"  . History of gout   . Hyperlipidemia   . Hypertension   . Neuromuscular disorder ([place])   . PAD (peripheral artery disease) ([place])   . Phlebitis   . Positive TB test   . Seasonal allergies   . Shortness of breath   . Spinal disease     Current Outpatient Medications  Medication Sig Dispense Refill  . albuterol (PROAIR HFA) 108 (90 Base) MCG/ACT inhaler USE 2 PUFFS EVERY 6 HOURS  AS NEEDED FOR WHEEZING OR  SHORTNESS OF BREATH 34 g 3  . albuterol (PROVENTIL) (2.5 MG/3ML) 0.083% nebulizer solution Take 3 mLs (2.5 mg total)  by nebulization every 6 (six) hours as needed for wheezing or shortness of breath. 75 mL 5  . aspirin EC 81 MG tablet Take 81 mg by mouth daily.    [name] atorvastatin (LIPITOR) 40 MG tablet TAKE 1 TABLET BY MOUTH  DAILY. &lt;PLEASE MAKE  APPOINTMENT FOR REFILLS&gt; 15 tablet 0  . Blood Glucose Monitoring Suppl (ONETOUCH VERIO) w/Device KIT 1 Device by Does not apply route once. 1 kit 0  . clopidogrel (PLAVIX) 75 MG tablet Take 1 tablet (75 mg total) by mouth daily. 15 tablet 0  . colchicine 0.6 MG tablet TAKE 2 TABLETS BY MOUTH AS  DIRECTED THEN 1 TABLET  AFTER 1 HOUR. CAN TAKE 1  TABLET TWICE DAILY  THEREAFTER. 60 tablet 11  . cyclobenzaprine (FLEXERIL) 10 MG tablet Take 1 tablet (10 mg total) by mouth 3 (three) times daily as needed for muscle spasms. 30 tablet 0  . diphenhydrAMINE (BENADRYL) 50 MG tablet Take 0.5 tablets (25 mg total) by mouth once. 1 hr prior to CT scan 30 tablet 0  . fexofenadine (ALLEGRA ALLERGY) 180 MG tablet Take 1 tablet (180 mg total) by mouth daily. 90 tablet 3  . Fluticasone-Umeclidin-Vilant (TRELEGY ELLIPTA) 100-62.5-25 MCG/INH AEPB Inhale 1 puff into the lungs daily. 2 each 5  . gabapentin (  NEURONTIN) 300 MG capsule Take 1 capsule (300 mg total) by mouth 3 (three) times daily. 90 capsule 3  . losartan (COZAAR) 25 MG tablet TAKE 1 TABLET BY MOUTH  DAILY 90 tablet 2  . nitroGLYCERIN (NITROSTAT) 0.4 MG SL tablet Place 1 tablet (0.4 mg total) under the tongue every 5 (five) minutes as needed for chest pain. 25 tablet 4  . Omega-3 Fatty Acids (FISH OIL) 1000 MG CAPS Take 1,000 mg by mouth daily.     [name] omeprazole (PRILOSEC) 40 MG capsule Take 1 capsule (40 mg total) by mouth daily. 90 capsule 4  . ONE TOUCH LANCETS MISC 1 each by Does not apply route 3 (three) times daily. 200 each 5  . ONETOUCH ULTRA test strip USE TO TEST 3 TIMES DAILY 300 strip 3  . OVER THE COUNTER MEDICATION Apply 1 application topically daily as needed (neuropathy pain). Hemp oil cream    . traZODone  (DESYREL) 100 MG tablet TAKE 1 TABLET BY MOUTH AT  BEDTIME 90 tablet 2   No current facility-administered medications for this visit.    Allergies  Allergen Reactions  . Lisinopril Hives and Itching  . Amitriptyline Other (See Comments)    Makes pt feel "weird" and nausea.  . Contrast Media [Iodinated Diagnostic Agents] Hives and Itching    Pt can use if taking benadryl   . Ibuprofen Other (See Comments)    Internal bleeding    Family History  Problem Relation Age of Onset  . Stomach cancer Mother        Deceased, 52s  . Hypertension Mother   . Hypertension Father   . Stroke Father   . Heart attack Father        Deceased, 60  . Aneurysm Brother   . Healthy Daughter   . Healthy Maternal Grandmother   . Leukemia Grandchild   . Diabetes Neg Hx   . Early death Neg Hx   . Colon cancer Neg Hx   . Pancreatic cancer Neg Hx     Social History   Socioeconomic History  . Marital status: Married    Spouse name: Not on file  . Number of children: Not on file  . Years of education: 7  . Highest education level: Not on file  Occupational History  . Occupation: Disabled    Employer: DISABLED  Tobacco Use  . Smoking status: Current Every Day Smoker    Packs/day: 0.50    Years: 49.00    Pack years: 24.50    Types: Cigarettes  . Smokeless tobacco: Never Used  . Tobacco comment: half a pack a day   Vaping Use  . Vaping Use: Never used  Substance and Sexual Activity  . Alcohol use: Yes    Alcohol/week: 0.0 standard drinks    Comment: 2 drinks a week  . Drug use: No  . Sexual activity: Not Currently  Other Topics Concern  . Not on file  Social History Narrative   Regular exercise-no   Caffeine Use-yes   Lives with wife in a one story home. Has 2 children.  On disability for low back pain.  Used to work as a Games developer, lasted worked in 2007.     Education: 7th grade.   Social Determinants of Health   Financial Resource Strain: Not on file  Food Insecurity: Not on  file  Transportation Needs: Not on file  Physical Activity: Not on file  Stress: Not on file  Social Connections: Not on file  Intimate Partner Violence: Not on file     Constitutional: Denies fever, malaise, fatigue, headache or abrupt weight changes.  Respiratory: Denies difficulty breathing, shortness of breath, cough or sputum production.   Cardiovascular: Denies chest pain, chest tightness, palpitations or swelling in the hands or feet.  Musculoskeletal: Pt reports right shoulder pain, swelling and weakness. Pt has a history of chronic neck pain. Denies difficulty with gait, muscle pain.  Skin: Denies redness, rashes, lesions or ulcercations.    No other specific complaints in a complete review of systems (except as listed in HPI above).  Objective:   Physical Exam  BP 130/82   Pulse 79   Temp 98.7 F (37.1 C) (Temporal)   Wt 188 lb (85.3 kg)   SpO2 97%   BMI 28.17 kg/m   Wt Readings from Last 3 Encounters:  05/11/20 185 lb 8 oz (84.1 kg)  01/19/20 180 lb 3.2 oz (81.7 kg)  10/27/19 175 lb 8 oz (79.6 kg)    General: Appears his stated age, appears in pain but in NAD. Skin: Warm, dry and intact. No rashesnoted. HEENT: Head: normal shape and size; Eyes: sclera white, EOMs intact; Cardiovascular: Normal rate and rhythm. Radial pulse 2+ on the right.  Pulmonary/Chest: Normal effort and positive vesicular breath sounds. No respiratory distress. No wheezes, rales or ronchi noted.  Musculoskeletal: Decreased flexion, extension, rotation and lateral bending of the cervical spine secondary to fusion. Decreased internal and external rotation of the right shoulder. Pain with palpation over the right AC joint, subacromial bursa, proximal and distal biceps tendons. Shoulder shrug equal. Strength 4/5 RUE, 5/5 LUE. Hand grips equal. Positive drop can test on the right. Neurological: Alert and oriented.  Coordination normal.    BMET    Component Value Date/Time   NA 139 10/13/2019  1517   NA 139 01/02/2018 0927   NA 140 11/25/2016 1430   K 3.3 (L) 10/13/2019 1517   K 4.3 11/25/2016 1430   CL 100 10/13/2019 1517   CO2 30 10/13/2019 1517   CO2 27 11/25/2016 1430   GLUCOSE 107 (H) 10/13/2019 1517   GLUCOSE 101 11/25/2016 1430   BUN 9 10/13/2019 1517   BUN 12 01/02/2018 0927   BUN 12.6 11/25/2016 1430   CREATININE 0.79 10/13/2019 1517   CREATININE 0.92 07/09/2019 1435   CREATININE 0.9 11/25/2016 1430   CALCIUM 8.9 10/13/2019 1517   CALCIUM 10.3 11/25/2016 1430   GFRNONAA >60 07/09/2018 1610   GFRAA >60 07/09/2018 1610    Lipid Panel     Component Value Date/Time   CHOL 192 07/09/2019 1435   CHOL 215 (H) 11/21/2017 0810   TRIG 79 07/09/2019 1435   HDL 101 07/09/2019 1435   HDL 126 11/21/2017 0810   CHOLHDL 1.9 07/09/2019 1435   VLDL 37.0 07/09/2018 1040   LDLCALC 75 07/09/2019 1435    CBC    Component Value Date/Time   WBC 9.9 10/13/2019 1517   RBC 4.39 10/13/2019 1517   HGB 15.8 10/13/2019 1517   HGB 16.4 01/02/2018 0927   HGB 19.2 (H) 11/25/2016 1430   HCT 46.3 10/13/2019 1517   HCT 46.2 01/02/2018 0927   HCT 54.8 (H) 11/25/2016 1430   PLT 250.0 10/13/2019 1517   PLT 269 01/02/2018 0927   MCV 105.5 (H) 10/13/2019 1517   MCV 97 01/02/2018 0927   MCV 92.3 11/25/2016 1430   MCH 33.4 (H) 07/09/2019 1435   MCHC 34.1 10/13/2019 1517   RDW 15.4 10/13/2019 1517  RDW 13.9 01/02/2018 0927   RDW 15.0 (H) 11/25/2016 1430   LYMPHSABS 2.2 10/13/2019 1517   LYMPHSABS 2.5 11/25/2016 1430   MONOABS 1.1 (H) 10/13/2019 1517   MONOABS 1.3 (H) 11/25/2016 1430   EOSABS 0.2 10/13/2019 1517   EOSABS 0.3 11/25/2016 1430   BASOSABS 0.2 (H) 10/13/2019 1517   BASOSABS 0.0 11/25/2016 1430    Hgb A1C Lab Results  Component Value Date   HGBA1C 5.3 07/09/2019            Assessment & Plan:   Chronic Right Shoulder Pain and Swelling, S/p Cervical Fusion:  Will obtain MRI right shoulder Will obtain MRI cervical spine Decadron 10 mg IM x 1 RX for  Tramadol 50-100 mg Q8H prn RX for Flexeril 10 mg TID prn- sedation caution given  Will follow up after imaging, return precautions discussed Webb Silversmith, NP This visit occurred during the SARS-CoV-2 public health emergency.  Safety protocols were in place, including screening questions prior to the visit, additional usage of staff PPE, and extensive cleaning of exam room while observing appropriate contact time as indicated for disinfecting solutions.

## 2020-07-13 ENCOUNTER — Other Ambulatory Visit: Payer: Self-pay | Admitting: Internal Medicine

## 2020-07-24 ENCOUNTER — Telehealth: Payer: Self-pay | Admitting: Cardiovascular Disease

## 2020-07-24 NOTE — Telephone Encounter (Signed)
    Pt is calling to ask info about his stent, he said he will get an MRI at [facility] and they need info if what he have is compatible with their machine. He said Dr. Fletcher [name] put a stent in 2019 in his heart and leg

## 2020-07-24 NOTE — Telephone Encounter (Signed)
Spoke with pt on the phone regarding having an MRI and concerns about stents in his leg.  Per procedure documentation for procedure done 01/07/2018, there were no stents placed in pt's leg at this time.  Atherectomy was used as well as drug coated balloon. Explained this to the pt. Pt verbalizes understanding.

## 2020-07-25 ENCOUNTER — Ambulatory Visit: Payer: Medicare Other | Admitting: Pulmonary Disease

## 2020-07-29 IMAGING — CT CT CHEST LUNG CANCER SCREENING LOW DOSE W/O CM
1 series · 10 of 10 positions shown, 13 images · non-contrast
Comparison: CT chest dated 12/05/2016

CLINICAL DATA: 61-year-old male current smoker, with 36 pack-year
history of smoking, for initial lung cancer screening

EXAM:
CT CHEST WITHOUT CONTRAST LOW-DOSE FOR LUNG CANCER SCREENING
TECHNIQUE: Multidetector CT imaging of the chest was performed following the
standard protocol without IV contrast.

[ct lung segmentation data · axial · 0.86mm/px · z∈[-657,-657]mm · 10 of 381 frames shown]
[frame 1/381  mediastinal]
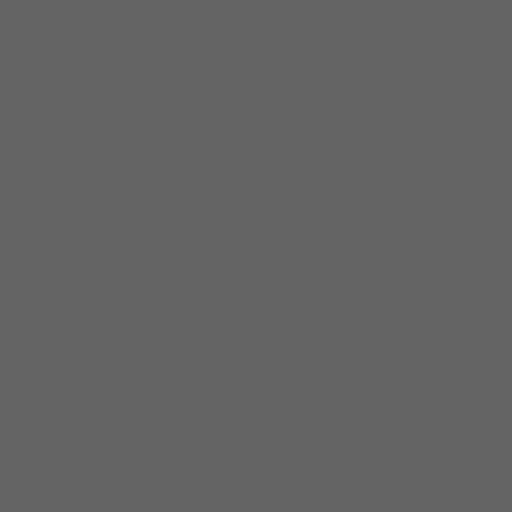
[frame 1/381  lung]
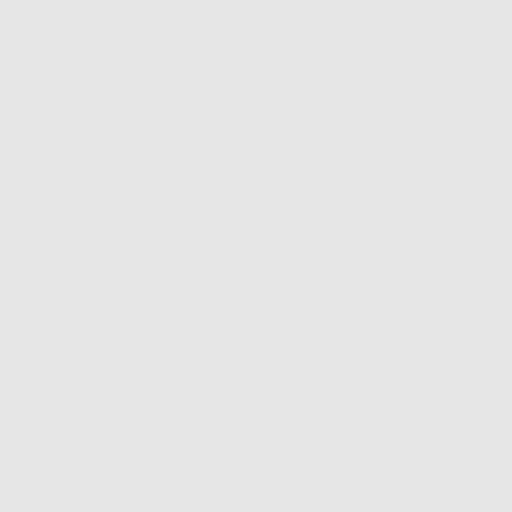
[frame 43/381  lung]
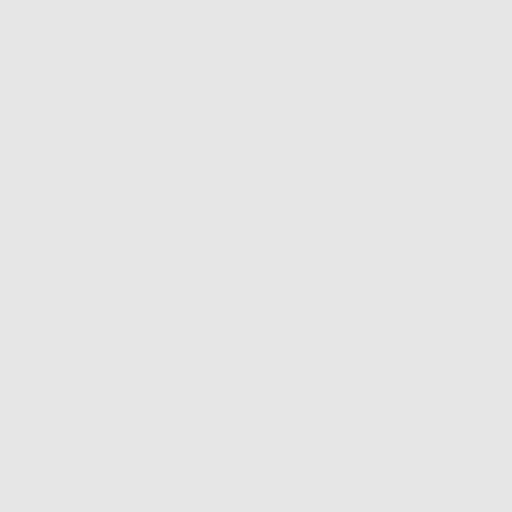
[frame 85/381  lung]
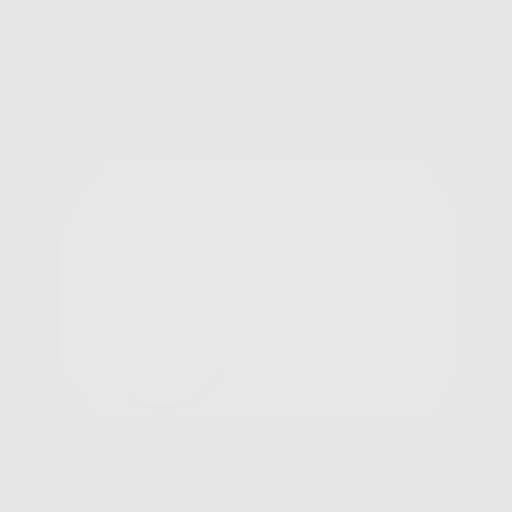
[frame 127/381  lung]
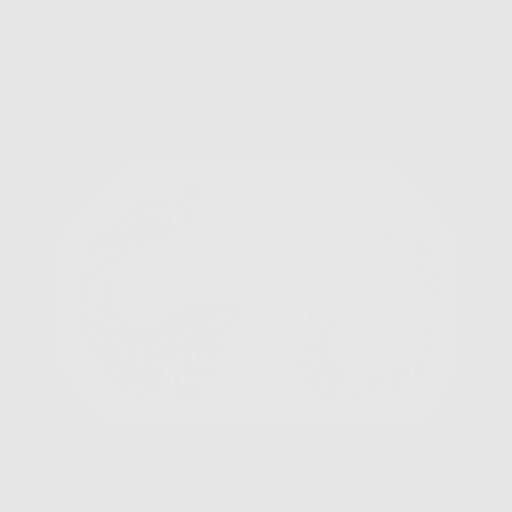
[frame 169/381  mediastinal]
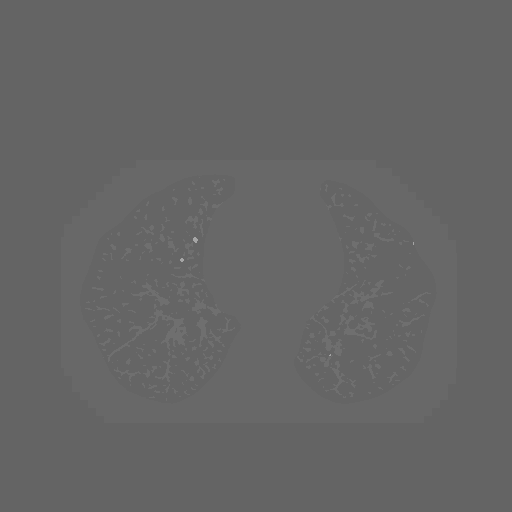
[frame 169/381  lung]
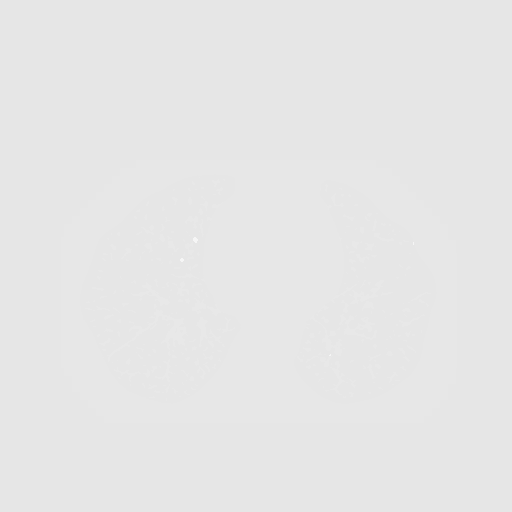
[frame 212/381  lung]
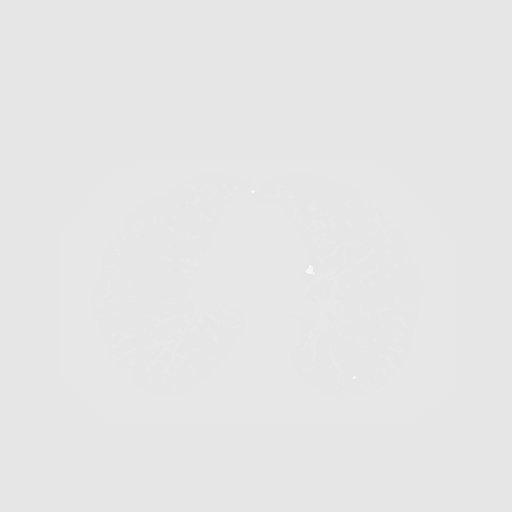
[frame 254/381  lung]
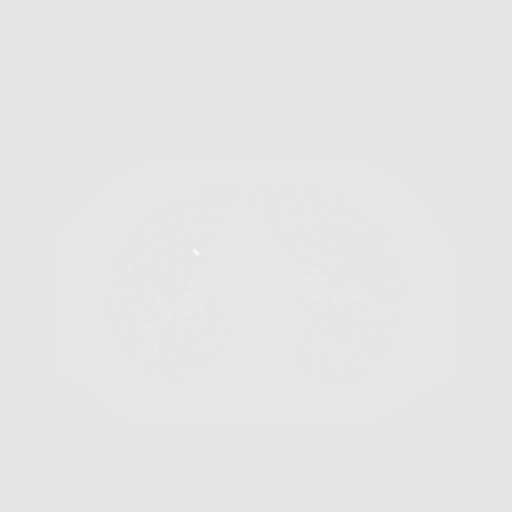
[frame 296/381  lung]
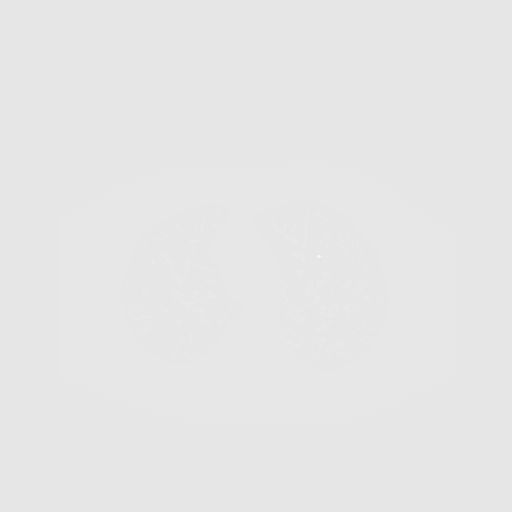
[frame 338/381  mediastinal]
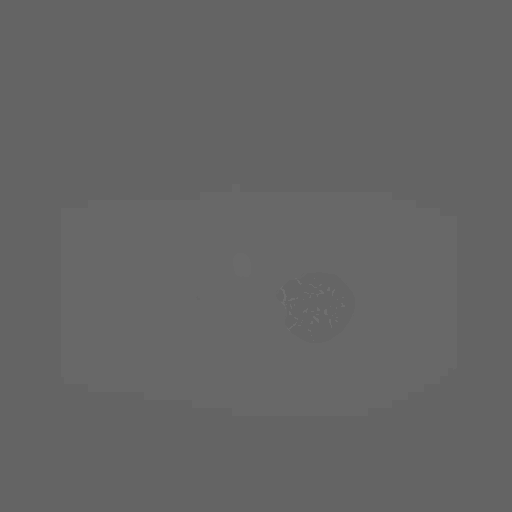
[frame 338/381  lung]
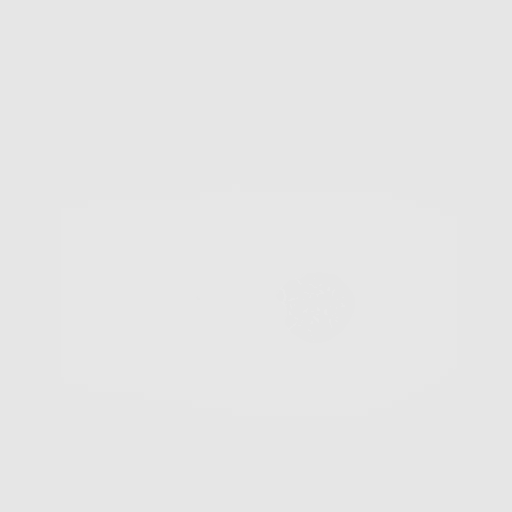
[frame 381/381  lung]
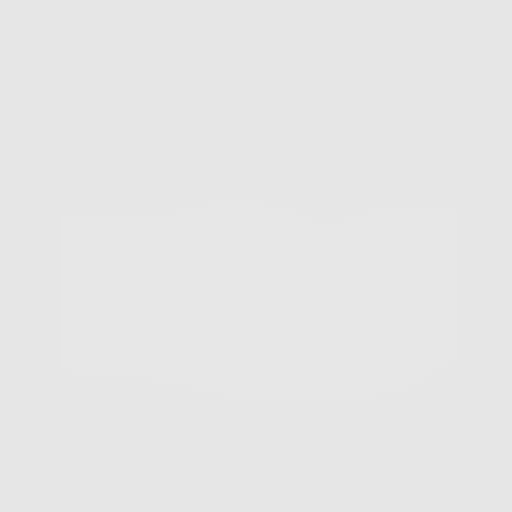

[10 of 10 positions shown; findings below may reference images not displayed]

FINDINGS: Cardiovascular: Heart is normal in size.  No pericardial effusion.

No evidence of thoracic aortic aneurysm. Atherosclerotic
calcifications of the aortic arch.

Three vessel coronary atherosclerosis.

Mediastinum/Nodes: No suspicious mediastinal lymphadenopathy.

Visualized thyroid is unremarkable.

Lungs/Pleura: Mild paraseptal emphysematous changes, upper lobe
predominant.

5.8 mm right lower lobe nodule, unchanged from prior CT.

[DATE] x 2.7 cm irregular thin-walled cavitary lesion in the right
upper lobe (series 3/image 143), previously 2.6 x 2.5 cm in 6523,
new from 0846. This is difficult to measure using the DynaCAD
software but is considered at least a 4A lesion.

No focal consolidation.

No pleural effusion or pneumothorax.

Upper Abdomen: Visualized upper abdomen is notable for hepatic
steatosis with suspected focal fat along the posterior aspect of
segment 4B (series 2/image 68). Vascular calcifications.

Musculoskeletal: Degenerative changes of the visualized
thoracolumbar spine. Cervical spine fixation hardware, incompletely
visualized.
IMPRESSION: Lung-RADS 4A, suspicious. Follow up low-dose chest CT without
contrast in 3 months (please use the following order, "CT CHEST LCS
NODULE FOLLOW-UP W/O CM") is recommended.

2.3 x 2.7 cm irregular thin-walled cavitary lesion in the right
upper lobe, progressive from 6523 and new from 0846.

Aortic Atherosclerosis (VOZ8F-8Z0.0) and Emphysema (VOZ8F-SUV.E).

These results will be called to the ordering clinician or
representative by the Radiologist Assistant, and communication
documented in the PACS or zVision Dashboard.

## 2020-07-30 ENCOUNTER — Other Ambulatory Visit: Payer: Medicare Other

## 2020-09-13 ENCOUNTER — Other Ambulatory Visit: Payer: Self-pay | Admitting: Cardiovascular Disease

## 2020-09-13 DIAGNOSIS — I739 Peripheral vascular disease, unspecified: Secondary | ICD-10-CM

## 2020-09-13 DIAGNOSIS — Z79899 Other long term (current) drug therapy: Secondary | ICD-10-CM

## 2020-09-13 DIAGNOSIS — I10 Essential (primary) hypertension: Secondary | ICD-10-CM

## 2020-09-13 NOTE — Telephone Encounter (Signed)
Please review for refill.  Looks like patient was last seen 01/2018.

## 2020-10-02 ENCOUNTER — Telehealth: Payer: Self-pay

## 2020-10-02 NOTE — Telephone Encounter (Signed)
[facility] Night - Client Nonclinical Telephone Record  [facility] Client [facility] Night - Client Client Site [place] Physician Webb Silversmith- NP Contact Type Call Who Is Calling Patient / Member / Family / Caregiver Caller Name [facility] Phone Number [phone] Patient Name Joshua [name] Patient DOB 1955-04-07 Call Type Message Only Information Provided Reason for Call Request for General Office Information Initial Comment Caller states her husband will be coming today to sign release paperwork - Disp. Time Disposition Final User 10/02/2020 7:59:42 AM General Information Provided Yes Kenton Kingfisher, Lanette Call Closed By: Nelia Shi Transaction Date/Time: 10/02/2020 7:57:11 AM (ET)

## 2020-10-24 DIAGNOSIS — M545 Low back pain, unspecified: Secondary | ICD-10-CM | POA: Diagnosis not present

## 2020-10-24 DIAGNOSIS — M25551 Pain in right hip: Secondary | ICD-10-CM | POA: Diagnosis not present

## 2020-10-31 DIAGNOSIS — M545 Low back pain, unspecified: Secondary | ICD-10-CM | POA: Diagnosis not present

## 2020-11-02 DIAGNOSIS — M545 Low back pain, unspecified: Secondary | ICD-10-CM | POA: Diagnosis not present

## 2020-11-13 ENCOUNTER — Telehealth: Payer: Self-pay | Admitting: Internal Medicine

## 2020-11-13 NOTE — Telephone Encounter (Signed)
BRB per rectum and has had diarrhea for the past 2 weeks.  He has taken Pepto for the diarrhea but that did not help.  He has not tried imodium.  He also complains of hemorrhoid pain.  He states "my butt is very sore"  He has been scheduled to see Ellouise Newer tomorrow at 10 am.

## 2020-11-14 ENCOUNTER — Encounter: Payer: Self-pay | Admitting: Physician Assistant

## 2020-11-14 ENCOUNTER — Other Ambulatory Visit: Payer: Self-pay | Admitting: Gastroenterology

## 2020-11-14 ENCOUNTER — Other Ambulatory Visit: Payer: Self-pay

## 2020-11-14 ENCOUNTER — Encounter (HOSPITAL_COMMUNITY): Payer: Self-pay | Admitting: *Deleted

## 2020-11-14 ENCOUNTER — Ambulatory Visit: Payer: Medicare Other | Admitting: Physician Assistant

## 2020-11-14 ENCOUNTER — Inpatient Hospital Stay (HOSPITAL_COMMUNITY)
Admission: EM | Admit: 2020-11-14 | Discharge: 2020-11-22 | DRG: 309 | Disposition: A | Discharge status: Home / Self Care | Payer: Medicare Other | Source: Ambulatory Visit | Attending: Internal Medicine | Admitting: Internal Medicine

## 2020-11-14 ENCOUNTER — Emergency Department (HOSPITAL_COMMUNITY): Payer: Medicare Other

## 2020-11-14 ENCOUNTER — Encounter (HOSPITAL_COMMUNITY): Payer: Self-pay

## 2020-11-14 ENCOUNTER — Other Ambulatory Visit: Payer: Self-pay | Admitting: Family

## 2020-11-14 VITALS — BP 100/68 | HR 105 | Ht 69.0 in | Wt 176.0 lb

## 2020-11-14 DIAGNOSIS — Z91120 Patient's intentional underdosing of medication regimen due to financial hardship: Secondary | ICD-10-CM

## 2020-11-14 DIAGNOSIS — E1151 Type 2 diabetes mellitus with diabetic peripheral angiopathy without gangrene: Secondary | ICD-10-CM | POA: Diagnosis present

## 2020-11-14 DIAGNOSIS — K253 Acute gastric ulcer without hemorrhage or perforation: Secondary | ICD-10-CM

## 2020-11-14 DIAGNOSIS — F10231 Alcohol dependence with withdrawal delirium: Secondary | ICD-10-CM | POA: Diagnosis not present

## 2020-11-14 DIAGNOSIS — Z91041 Radiographic dye allergy status: Secondary | ICD-10-CM

## 2020-11-14 DIAGNOSIS — Z981 Arthrodesis status: Secondary | ICD-10-CM

## 2020-11-14 DIAGNOSIS — K219 Gastro-esophageal reflux disease without esophagitis: Secondary | ICD-10-CM

## 2020-11-14 DIAGNOSIS — Z888 Allergy status to other drugs, medicaments and biological substances status: Secondary | ICD-10-CM

## 2020-11-14 DIAGNOSIS — M79661 Pain in right lower leg: Secondary | ICD-10-CM | POA: Diagnosis not present

## 2020-11-14 DIAGNOSIS — K2970 Gastritis, unspecified, without bleeding: Secondary | ICD-10-CM

## 2020-11-14 DIAGNOSIS — Z7982 Long term (current) use of aspirin: Secondary | ICD-10-CM

## 2020-11-14 DIAGNOSIS — I480 Paroxysmal atrial fibrillation: Principal | ICD-10-CM | POA: Diagnosis present

## 2020-11-14 DIAGNOSIS — K635 Polyp of colon: Secondary | ICD-10-CM | POA: Diagnosis not present

## 2020-11-14 DIAGNOSIS — K921 Melena: Secondary | ICD-10-CM

## 2020-11-14 DIAGNOSIS — K2980 Duodenitis without bleeding: Secondary | ICD-10-CM | POA: Diagnosis present

## 2020-11-14 DIAGNOSIS — K259 Gastric ulcer, unspecified as acute or chronic, without hemorrhage or perforation: Secondary | ICD-10-CM | POA: Diagnosis not present

## 2020-11-14 DIAGNOSIS — I739 Peripheral vascular disease, unspecified: Secondary | ICD-10-CM | POA: Diagnosis not present

## 2020-11-14 DIAGNOSIS — D696 Thrombocytopenia, unspecified: Secondary | ICD-10-CM | POA: Diagnosis present

## 2020-11-14 DIAGNOSIS — B9681 Helicobacter pylori [H. pylori] as the cause of diseases classified elsewhere: Secondary | ICD-10-CM | POA: Diagnosis not present

## 2020-11-14 DIAGNOSIS — M109 Gout, unspecified: Secondary | ICD-10-CM | POA: Diagnosis present

## 2020-11-14 DIAGNOSIS — K648 Other hemorrhoids: Secondary | ICD-10-CM | POA: Diagnosis not present

## 2020-11-14 DIAGNOSIS — F17210 Nicotine dependence, cigarettes, uncomplicated: Secondary | ICD-10-CM | POA: Diagnosis present

## 2020-11-14 DIAGNOSIS — E1142 Type 2 diabetes mellitus with diabetic polyneuropathy: Secondary | ICD-10-CM | POA: Diagnosis not present

## 2020-11-14 DIAGNOSIS — K7010 Alcoholic hepatitis without ascites: Secondary | ICD-10-CM | POA: Diagnosis present

## 2020-11-14 DIAGNOSIS — M7989 Other specified soft tissue disorders: Secondary | ICD-10-CM | POA: Diagnosis not present

## 2020-11-14 DIAGNOSIS — K644 Residual hemorrhoidal skin tags: Secondary | ICD-10-CM | POA: Diagnosis present

## 2020-11-14 DIAGNOSIS — K2101 Gastro-esophageal reflux disease with esophagitis, with bleeding: Secondary | ICD-10-CM | POA: Diagnosis not present

## 2020-11-14 DIAGNOSIS — K254 Chronic or unspecified gastric ulcer with hemorrhage: Secondary | ICD-10-CM | POA: Diagnosis not present

## 2020-11-14 DIAGNOSIS — I10 Essential (primary) hypertension: Secondary | ICD-10-CM

## 2020-11-14 DIAGNOSIS — E785 Hyperlipidemia, unspecified: Secondary | ICD-10-CM | POA: Diagnosis not present

## 2020-11-14 DIAGNOSIS — I2510 Atherosclerotic heart disease of native coronary artery without angina pectoris: Secondary | ICD-10-CM

## 2020-11-14 DIAGNOSIS — Z886 Allergy status to analgesic agent status: Secondary | ICD-10-CM

## 2020-11-14 DIAGNOSIS — Z79899 Other long term (current) drug therapy: Secondary | ICD-10-CM

## 2020-11-14 DIAGNOSIS — D62 Acute posthemorrhagic anemia: Secondary | ICD-10-CM | POA: Diagnosis present

## 2020-11-14 DIAGNOSIS — D122 Benign neoplasm of ascending colon: Secondary | ICD-10-CM | POA: Diagnosis not present

## 2020-11-14 DIAGNOSIS — Z8249 Family history of ischemic heart disease and other diseases of the circulatory system: Secondary | ICD-10-CM

## 2020-11-14 DIAGNOSIS — E876 Hypokalemia: Secondary | ICD-10-CM | POA: Diagnosis not present

## 2020-11-14 DIAGNOSIS — K8020 Calculus of gallbladder without cholecystitis without obstruction: Secondary | ICD-10-CM | POA: Diagnosis present

## 2020-11-14 DIAGNOSIS — K2100 Gastro-esophageal reflux disease with esophagitis, without bleeding: Secondary | ICD-10-CM | POA: Diagnosis not present

## 2020-11-14 DIAGNOSIS — K2900 Acute gastritis without bleeding: Secondary | ICD-10-CM | POA: Diagnosis present

## 2020-11-14 DIAGNOSIS — R296 Repeated falls: Secondary | ICD-10-CM | POA: Diagnosis present

## 2020-11-14 DIAGNOSIS — Z86718 Personal history of other venous thrombosis and embolism: Secondary | ICD-10-CM

## 2020-11-14 DIAGNOSIS — R197 Diarrhea, unspecified: Secondary | ICD-10-CM

## 2020-11-14 DIAGNOSIS — K922 Gastrointestinal hemorrhage, unspecified: Secondary | ICD-10-CM | POA: Diagnosis present

## 2020-11-14 DIAGNOSIS — Z823 Family history of stroke: Secondary | ICD-10-CM

## 2020-11-14 DIAGNOSIS — Z95828 Presence of other vascular implants and grafts: Secondary | ICD-10-CM

## 2020-11-14 DIAGNOSIS — J449 Chronic obstructive pulmonary disease, unspecified: Secondary | ICD-10-CM | POA: Diagnosis not present

## 2020-11-14 DIAGNOSIS — Z955 Presence of coronary angioplasty implant and graft: Secondary | ICD-10-CM

## 2020-11-14 DIAGNOSIS — B159 Hepatitis A without hepatic coma: Secondary | ICD-10-CM | POA: Diagnosis present

## 2020-11-14 DIAGNOSIS — K449 Diaphragmatic hernia without obstruction or gangrene: Secondary | ICD-10-CM | POA: Diagnosis present

## 2020-11-14 DIAGNOSIS — Z800 Family history of malignant neoplasm of digestive organs: Secondary | ICD-10-CM

## 2020-11-14 DIAGNOSIS — M79604 Pain in right leg: Secondary | ICD-10-CM | POA: Diagnosis present

## 2020-11-14 DIAGNOSIS — J302 Other seasonal allergic rhinitis: Secondary | ICD-10-CM | POA: Diagnosis present

## 2020-11-14 DIAGNOSIS — L03113 Cellulitis of right upper limb: Secondary | ICD-10-CM | POA: Diagnosis present

## 2020-11-14 DIAGNOSIS — Z85118 Personal history of other malignant neoplasm of bronchus and lung: Secondary | ICD-10-CM

## 2020-11-14 DIAGNOSIS — Z8672 Personal history of thrombophlebitis: Secondary | ICD-10-CM

## 2020-11-14 DIAGNOSIS — Z781 Physical restraint status: Secondary | ICD-10-CM

## 2020-11-14 DIAGNOSIS — I4891 Unspecified atrial fibrillation: Secondary | ICD-10-CM | POA: Diagnosis not present

## 2020-11-14 DIAGNOSIS — R1084 Generalized abdominal pain: Secondary | ICD-10-CM

## 2020-11-14 DIAGNOSIS — Z20822 Contact with and (suspected) exposure to covid-19: Secondary | ICD-10-CM | POA: Diagnosis not present

## 2020-11-14 DIAGNOSIS — K269 Duodenal ulcer, unspecified as acute or chronic, without hemorrhage or perforation: Secondary | ICD-10-CM

## 2020-11-14 DIAGNOSIS — T471X6A Underdosing of other antacids and anti-gastric-secretion drugs, initial encounter: Secondary | ICD-10-CM | POA: Diagnosis present

## 2020-11-14 DIAGNOSIS — Z806 Family history of leukemia: Secondary | ICD-10-CM

## 2020-11-14 DIAGNOSIS — F10931 Alcohol use, unspecified with withdrawal delirium: Secondary | ICD-10-CM

## 2020-11-14 DIAGNOSIS — R531 Weakness: Secondary | ICD-10-CM | POA: Diagnosis not present

## 2020-11-14 DIAGNOSIS — K3189 Other diseases of stomach and duodenum: Secondary | ICD-10-CM | POA: Diagnosis not present

## 2020-11-14 DIAGNOSIS — T45526A Underdosing of antithrombotic drugs, initial encounter: Secondary | ICD-10-CM | POA: Diagnosis present

## 2020-11-14 DIAGNOSIS — F1010 Alcohol abuse, uncomplicated: Secondary | ICD-10-CM | POA: Diagnosis not present

## 2020-11-14 LAB — HEMOGLOBIN AND HEMATOCRIT, BLOOD
HCT: 35.5 % — ABNORMAL LOW (ref 39.0–52.0)
HCT: 33.8 % — ABNORMAL LOW (ref 39.0–52.0)
Hemoglobin: 12.6 g/dL — ABNORMAL LOW (ref 13.0–17.0)
Hemoglobin: 12 g/dL — ABNORMAL LOW (ref 13.0–17.0)

## 2020-11-14 LAB — CBC
HCT: 35.2 % — ABNORMAL LOW (ref 39.0–52.0)
Hemoglobin: 12.8 g/dL — ABNORMAL LOW (ref 13.0–17.0)
MCH: 37.6 pg — ABNORMAL HIGH (ref 26.0–34.0)
MCHC: 36.4 g/dL — ABNORMAL HIGH (ref 30.0–36.0)
MCV: 103.5 fL — ABNORMAL HIGH (ref 80.0–100.0)
Platelets: 81 10*3/uL — ABNORMAL LOW (ref 150–400)
RBC: 3.4 MIL/uL — ABNORMAL LOW (ref 4.22–5.81)
RDW: 12.9 % (ref 11.5–15.5)
WBC: 5.5 10*3/uL (ref 4.0–10.5)
nRBC: 0 % (ref 0.0–0.2)

## 2020-11-14 LAB — COMPREHENSIVE METABOLIC PANEL
ALT: 63 U/L — ABNORMAL HIGH (ref 0–44)
AST: 221 U/L — ABNORMAL HIGH (ref 15–41)
Albumin: 3.5 g/dL (ref 3.5–5.0)
Alkaline Phosphatase: 169 U/L — ABNORMAL HIGH (ref 38–126)
Anion gap: 15 (ref 5–15)
BUN: 10 mg/dL (ref 8–23)
CO2: 24 mmol/L (ref 22–32)
Calcium: 8.7 mg/dL — ABNORMAL LOW (ref 8.9–10.3)
Chloride: 102 mmol/L (ref 98–111)
Creatinine, Ser: 0.73 mg/dL (ref 0.61–1.24)
GFR, Estimated: >60 mL/min (ref >60)
Glucose, Bld: 109 mg/dL — ABNORMAL HIGH (ref 70–99)
Potassium: 3.5 mmol/L (ref 3.5–5.1)
Sodium: 141 mmol/L (ref 135–145)
Total Bilirubin: 1 mg/dL (ref 0.3–1.2)
Total Protein: 6.6 g/dL (ref 6.5–8.1)

## 2020-11-14 LAB — MAGNESIUM: Magnesium: 0.8 mg/dL — CL (ref 1.7–2.4)

## 2020-11-14 LAB — TYPE AND SCREEN
ABO/RH(D): A POS
Antibody Screen: NEGATIVE

## 2020-11-14 LAB — CBC WITH DIFFERENTIAL/PLATELET
Abs Immature Granulocytes: 0.05 10*3/uL (ref 0.00–0.07)
Basophils Absolute: 0 10*3/uL (ref 0.0–0.1)
Basophils Relative: 1 %
Eosinophils Absolute: 0.1 10*3/uL (ref 0.0–0.5)
Eosinophils Relative: 2 %
HCT: 39.7 % (ref 39.0–52.0)
Hemoglobin: 14 g/dL (ref 13.0–17.0)
Immature Granulocytes: 1 %
Lymphocytes Relative: 27 %
Lymphs Abs: 1.6 10*3/uL (ref 0.7–4.0)
MCH: 36.9 pg — ABNORMAL HIGH (ref 26.0–34.0)
MCHC: 35.3 g/dL (ref 30.0–36.0)
MCV: 104.7 fL — ABNORMAL HIGH (ref 80.0–100.0)
Monocytes Absolute: 0.5 10*3/uL (ref 0.1–1.0)
Monocytes Relative: 9 %
Neutro Abs: 3.6 10*3/uL (ref 1.7–7.7)
Neutrophils Relative %: 60 %
Platelets: 102 10*3/uL — ABNORMAL LOW (ref 150–400)
RBC: 3.79 MIL/uL — ABNORMAL LOW (ref 4.22–5.81)
RDW: 13 % (ref 11.5–15.5)
WBC: 5.9 10*3/uL (ref 4.0–10.5)
nRBC: 0 % (ref 0.0–0.2)

## 2020-11-14 LAB — RESP PANEL BY RT-PCR (FLU A&B, COVID) ARPGX2
Influenza A by PCR: NEGATIVE
Influenza B by PCR: NEGATIVE
SARS Coronavirus 2 by RT PCR: NEGATIVE

## 2020-11-14 LAB — PROTIME-INR
INR: 0.8 (ref 0.8–1.2)
Prothrombin Time: 11 seconds — ABNORMAL LOW (ref 11.4–15.2)

## 2020-11-14 LAB — LIPASE, BLOOD: Lipase: 92 U/L — ABNORMAL HIGH (ref 11–51)

## 2020-11-14 LAB — HIV ANTIBODY (ROUTINE TESTING W REFLEX): HIV Screen 4th Generation wRfx: NONREACTIVE

## 2020-11-14 LAB — ABO/RH: ABO/RH(D): A POS

## 2020-11-14 LAB — PHOSPHORUS: Phosphorus: 2.5 mg/dL (ref 2.5–4.6)

## 2020-11-14 MED ORDER — THIAMINE HCL 100 MG PO TABS
100.0000 mg | ORAL_TABLET | Freq: Every day | ORAL | Status: DC
  Administered 2020-11-14 – 2020-11-22 (×8): 100 mg via ORAL
  Filled 2020-11-14 (×9): qty 1

## 2020-11-14 MED ORDER — ALBUTEROL SULFATE (2.5 MG/3ML) 0.083% IN NEBU: 3.0000 mL | INHALATION_SOLUTION | Freq: Four times a day (QID) | RESPIRATORY_TRACT | Status: DC | PRN

## 2020-11-14 MED ORDER — ONDANSETRON HCL 4 MG/2ML IJ SOLN: 4.0000 mg | Freq: Four times a day (QID) | INTRAMUSCULAR | Status: DC | PRN

## 2020-11-14 MED ORDER — ONDANSETRON HCL 4 MG PO TABS: 4.0000 mg | ORAL_TABLET | Freq: Four times a day (QID) | ORAL | Status: DC | PRN

## 2020-11-14 MED ORDER — ACETAMINOPHEN 325 MG PO TABS
650.0000 mg | ORAL_TABLET | Freq: Four times a day (QID) | ORAL | Status: DC | PRN
  Administered 2020-11-14 – 2020-11-20 (×5): 650 mg via ORAL
  Filled 2020-11-14 (×5): qty 2

## 2020-11-14 MED ORDER — ADULT MULTIVITAMIN W/MINERALS CH
1.0000 | ORAL_TABLET | Freq: Every day | ORAL | Status: DC
  Administered 2020-11-14 – 2020-11-22 (×9): 1 via ORAL
  Filled 2020-11-14 (×9): qty 1

## 2020-11-14 MED ORDER — LORAZEPAM 2 MG/ML IJ SOLN
1.0000 mg | INTRAMUSCULAR | Status: DC | PRN
  Administered 2020-11-14 – 2020-11-15 (×2): 2 mg via INTRAVENOUS
  Administered 2020-11-16 (×2): 4 mg via INTRAVENOUS
  Administered 2020-11-16 (×3): 2 mg via INTRAVENOUS
  Administered 2020-11-17 (×2): 3 mg via INTRAVENOUS
  Administered 2020-11-17: 4 mg via INTRAVENOUS
  Filled 2020-11-14: qty 1
  Filled 2020-11-14: qty 2
  Filled 2020-11-14 (×2): qty 1
  Filled 2020-11-14: qty 2
  Filled 2020-11-14 (×2): qty 1
  Filled 2020-11-14 (×4): qty 2

## 2020-11-14 MED ORDER — FLUTICASONE FUROATE-VILANTEROL 100-25 MCG/INH IN AEPB
1.0000 | INHALATION_SPRAY | Freq: Every day | RESPIRATORY_TRACT | Status: DC
  Administered 2020-11-15 – 2020-11-22 (×7): 1 via RESPIRATORY_TRACT
  Filled 2020-11-14: qty 28

## 2020-11-14 MED ORDER — ATORVASTATIN CALCIUM 40 MG PO TABS
40.0000 mg | ORAL_TABLET | Freq: Every day | ORAL | Status: DC
  Administered 2020-11-15 – 2020-11-22 (×8): 40 mg via ORAL
  Filled 2020-11-14 (×9): qty 1

## 2020-11-14 MED ORDER — LACTATED RINGERS IV BOLUS
500.0000 mL | Freq: Once | INTRAVENOUS | Status: AC
  Administered 2020-11-14: 500 mL via INTRAVENOUS

## 2020-11-14 MED ORDER — PANTOPRAZOLE SODIUM 40 MG IV SOLR
40.0000 mg | Freq: Two times a day (BID) | INTRAVENOUS | Status: DC
  Administered 2020-11-14 – 2020-11-18 (×10): 40 mg via INTRAVENOUS
  Filled 2020-11-14 (×9): qty 40

## 2020-11-14 MED ORDER — LOSARTAN POTASSIUM 25 MG PO TABS
25.0000 mg | ORAL_TABLET | Freq: Every day | ORAL | Status: DC
  Administered 2020-11-15 – 2020-11-22 (×8): 25 mg via ORAL
  Filled 2020-11-14 (×8): qty 1

## 2020-11-14 MED ORDER — ATORVASTATIN CALCIUM 40 MG PO TABS
40.0000 mg | ORAL_TABLET | Freq: Every day | ORAL | 1 refills | Status: DC
Start: 2020-11-14 — End: 2020-12-29

## 2020-11-14 MED ORDER — TRAZODONE HCL 100 MG PO TABS: 100.0000 mg | ORAL_TABLET | Freq: Every day | ORAL | Status: DC

## 2020-11-14 MED ORDER — TIZANIDINE HCL 4 MG PO TABS
2.0000 mg | ORAL_TABLET | Freq: Four times a day (QID) | ORAL | Status: DC | PRN
  Administered 2020-11-14 – 2020-11-21 (×9): 2 mg via ORAL
  Filled 2020-11-14 (×9): qty 1

## 2020-11-14 MED ORDER — DILTIAZEM HCL 25 MG/5ML IV SOLN
10.0000 mg | Freq: Once | INTRAVENOUS | Status: AC
  Administered 2020-11-14: 10 mg via INTRAVENOUS
  Filled 2020-11-14: qty 5

## 2020-11-14 MED ORDER — MAGNESIUM SULFATE 2 GM/50ML IV SOLN
2.0000 g | INTRAVENOUS | Status: AC
Start: 2020-11-14 — End: 2020-11-15
  Administered 2020-11-14 – 2020-11-15 (×3): 2 g via INTRAVENOUS
  Filled 2020-11-14 (×3): qty 50

## 2020-11-14 MED ORDER — LORATADINE 10 MG PO TABS
10.0000 mg | ORAL_TABLET | Freq: Every day | ORAL | Status: DC
  Administered 2020-11-15 – 2020-11-22 (×8): 10 mg via ORAL
  Filled 2020-11-14 (×10): qty 1

## 2020-11-14 MED ORDER — NICOTINE 14 MG/24HR TD PT24
14.0000 mg | MEDICATED_PATCH | Freq: Every day | TRANSDERMAL | Status: DC
  Administered 2020-11-14 – 2020-11-22 (×9): 14 mg via TRANSDERMAL
  Filled 2020-11-14 (×9): qty 1

## 2020-11-14 MED ORDER — CLOPIDOGREL BISULFATE 75 MG PO TABS
75.0000 mg | ORAL_TABLET | Freq: Every day | ORAL | 1 refills | Status: DC
Start: 2020-11-14 — End: 2020-12-29

## 2020-11-14 MED ORDER — UMECLIDINIUM BROMIDE 62.5 MCG/INH IN AEPB
1.0000 | INHALATION_SPRAY | Freq: Every day | RESPIRATORY_TRACT | Status: DC
  Administered 2020-11-15 – 2020-11-22 (×7): 1 via RESPIRATORY_TRACT
  Filled 2020-11-14: qty 7

## 2020-11-14 MED ORDER — LACTATED RINGERS IV SOLN: INTRAVENOUS | Status: DC

## 2020-11-14 MED ORDER — FOLIC ACID 1 MG PO TABS
1.0000 mg | ORAL_TABLET | Freq: Every day | ORAL | Status: DC
  Administered 2020-11-14 – 2020-11-22 (×9): 1 mg via ORAL
  Filled 2020-11-14 (×9): qty 1

## 2020-11-14 MED ORDER — GABAPENTIN 300 MG PO CAPS
300.0000 mg | ORAL_CAPSULE | Freq: Three times a day (TID) | ORAL | Status: DC
  Administered 2020-11-14 – 2020-11-22 (×24): 300 mg via ORAL
  Filled 2020-11-14 (×25): qty 1

## 2020-11-14 MED ORDER — METHOCARBAMOL 500 MG PO TABS
500.0000 mg | ORAL_TABLET | Freq: Four times a day (QID) | ORAL | Status: DC | PRN
  Administered 2020-11-14: 500 mg via ORAL
  Filled 2020-11-14: qty 1

## 2020-11-14 MED ORDER — FLUTICASONE-UMECLIDIN-VILANT 100-62.5-25 MCG/INH IN AEPB: 1.0000 | INHALATION_SPRAY | Freq: Every day | RESPIRATORY_TRACT | Status: DC

## 2020-11-14 MED ORDER — THIAMINE HCL 100 MG/ML IJ SOLN
100.0000 mg | Freq: Every day | INTRAMUSCULAR | Status: DC
  Administered 2020-11-15: 100 mg via INTRAVENOUS
  Filled 2020-11-14 (×3): qty 2

## 2020-11-14 MED ORDER — HYDRALAZINE HCL 20 MG/ML IJ SOLN
5.0000 mg | Freq: Four times a day (QID) | INTRAMUSCULAR | Status: DC | PRN
  Administered 2020-11-16: 5 mg via INTRAVENOUS
  Filled 2020-11-14: qty 1

## 2020-11-14 MED ORDER — TRAZODONE HCL 50 MG PO TABS
50.0000 mg | ORAL_TABLET | Freq: Every day | ORAL | Status: DC
  Administered 2020-11-14 – 2020-11-21 (×8): 50 mg via ORAL
  Filled 2020-11-14 (×8): qty 1

## 2020-11-14 MED ORDER — LORAZEPAM 1 MG PO TABS
1.0000 mg | ORAL_TABLET | ORAL | Status: DC | PRN
  Administered 2020-11-16 (×5): 2 mg via ORAL
  Administered 2020-11-17: 1 mg via ORAL
  Filled 2020-11-14 (×4): qty 2
  Filled 2020-11-14: qty 4
  Filled 2020-11-14: qty 1

## 2020-11-14 NOTE — ED Triage Notes (Signed)
Pt complains of black diarrhea, lower abdominal pain, nausea x 2 weeks. Reports falling more over past 2 weeks.

## 2020-11-14 NOTE — Progress Notes (Signed)
Agree with GI PA-C assessment and plan.  He is not appropriate for outpatient evaluation.  He has a number of significant medical issues and social barriers.

## 2020-11-14 NOTE — ED Provider Notes (Signed)
[facility] DEPT Provider Note   CSN: [id number] Arrival date & time: 11/14/20  1042     History Chief Complaint  Patient presents with   Abdominal Pain    Joshua [name] is a 65 y.o. male.  65 year old male with prior medical history as detailed below presents for evaluation.  Patient reports dark loose stool for the last 2 weeks.  This is associated with generalized weakness.  Patient reports that he presented to of our GI for evaluation today.  Patient reports that his rectal exam there demonstrate evidence of blood in his stool.  Patient was sent to the ED for work-up and admission.  Patient reports prior history of duodenal ulcer.  He denies prior history of transfusion. He is currently taking ASA daily.  He reports 1 episode of vomiting earlier this week.  He did not produce any bloody emesis without event.  He appears comfortable at time of my evaluation.  He reports that his weakness is worse with exertion.  The history is provided by the patient.  Illness Location:  Gi bleed, blood in stool Severity:  Moderate Onset quality:  Gradual Duration:  2 weeks Timing:  Constant Progression:  Worsening Chronicity:  New     Past Medical History:  Diagnosis Date   AC (acromioclavicular) joint bone spurs, unspecified laterality 11/25/2016   Arthritis    "back" (05/16/2015)   CAD in native artery, occluded Lcx and rotational atherectomy to LAD with DES 05/17/15 05/18/2015   Cavitary lesion of lung 02/25/2018   Chronic back pain    "all my back"   Chronic bronchitis (HCC)    COPD (chronic obstructive pulmonary disease) ([place])    Diabetes mellitus without complication (HCC)    GERD (gastroesophageal reflux disease)    H/O blood clots    "had them in my back; put filter in before one of my neck ORs"   History of gout    Hyperlipidemia    Hypertension    Neuromuscular disorder ([place])    PAD (peripheral artery disease) (HCC)    Phlebitis     Positive TB test    Seasonal allergies    Shortness of breath    Spinal disease     Patient Active Problem List   Diagnosis Date Noted   Periumbilical abdominal pain 10/27/2019   Duodenal ulcer 10/27/2019   Acute gastric erosion 26/94/8546   Helicobacter pylori gastritis 10/27/2019   Elevated LFTs 10/27/2019   Generalized abdominal pain 10/13/2019   Acute pain of right shoulder 10/13/2019   Fall 10/13/2019   Malignant neoplasm of upper lobe of right lung ([place]) 03/02/2018   Arthritis 01/11/2018   HLD (hyperlipidemia) 01/11/2018   Gout 01/11/2018   PAD (peripheral artery disease) ([place]) 01/07/2018   CAD in native artery, occluded Lcx and rotational atherectomy to LAD with DES 05/17/15 05/18/2015   Type 2 diabetes mellitus with peripheral neuropathy ([place]) 05/08/2015   GERD (gastroesophageal reflux disease) 03/21/2015   Insomnia 09/08/2012   Essential hypertension 11/20/2011   Moderate COPD (chronic obstructive pulmonary disease) ([place]) 11/20/2011   Peripheral vascular disease ([place]) 11/20/2011    Past Surgical History:  Procedure Laterality Date   ABDOMINAL AORTOGRAM W/LOWER EXTREMITY  01/07/2018   ABDOMINAL AORTOGRAM W/LOWER EXTREMITY N/A 01/07/2018   Procedure: ABDOMINAL AORTOGRAM W/LOWER EXTREMITY;  Surgeon: Wellington Hampshire, MD;  Location: [facility];  Service: Cardiovascular;  Laterality: N/A;   ANTERIOR CERVICAL DECOMP/DISCECTOMY FUSION  ~ 2001-2009 X 3   ANTERIOR CERVICAL DECOMP/DISCECTOMY  FUSION N/A 10/06/2012   Procedure: ANTERIOR CERVICAL DECOMPRESSION/DISCECTOMY FUSION 1 LEVEL;  Surgeon: [name], MD;  Location: [facility];  Service: Neurosurgery;  Laterality: N/A;  C7T1 anterior cervical decompression with fusion plating and bonegraft    BACK SURGERY     CARDIAC CATHETERIZATION  05/16/2015   CARDIAC CATHETERIZATION N/A 05/16/2015   Procedure: Left Heart Cath and Coronary Angiography;  Surgeon: [name], MD;  Location: [facility];  Service:  Cardiovascular;  Laterality: N/A;   CARDIAC CATHETERIZATION N/A 05/16/2015   Procedure: Coronary Balloon Angioplasty;  Surgeon: [name], MD;  Location: [facility];  Service: Cardiovascular;  Laterality: N/A;   CARDIAC CATHETERIZATION N/A 05/17/2015   Procedure: Coronary Stent Intervention Rotoblater;  Surgeon: Sigfredo Schreier M Martinique, MD;  Location: [facility];  Service: Cardiovascular;  Laterality: N/A;   ELBOW SURGERY Left 2016   "had bone pieces removed"   ESOPHAGEAL MANOMETRY N/A 12/30/2016   Procedure: ESOPHAGEAL MANOMETRY (EM);  Surgeon: Irene Shipper, MD;  Location: [facility];  Service: Endoscopy;  Laterality: N/A;   EXCISIONAL HEMORRHOIDECTOMY     FEMORAL BYPASS  ~ 2010   IR RADIOLOGIST EVAL & MGMT  12/17/2016   IVC FILTER PLACEMENT ([facility]X)  2010   "had blood clots in my back"   NECK SURGERY  2014   2008-2014   PERIPHERAL VASCULAR ATHERECTOMY  01/07/2018   Procedure: PERIPHERAL VASCULAR ATHERECTOMY;  Surgeon: Wellington Hampshire, MD;  Location: [facility];  Service: Cardiovascular;;   PERIPHERAL VASCULAR BALLOON ANGIOPLASTY  01/07/2018   Procedure: PERIPHERAL VASCULAR BALLOON ANGIOPLASTY;  Surgeon: Wellington Hampshire, MD;  Location: [facility];  Service: Cardiovascular;;   PERIPHERAL VASCULAR CATHETERIZATION N/A 02/14/2016   Procedure: Abdominal Aortogram w/Lower Extremity;  Surgeon: Wellington Hampshire, MD;  Location: [facility];  Service: Cardiovascular;  Laterality: N/A;   POSTERIOR LAMINECTOMY / DECOMPRESSION LUMBAR SPINE  2006   "bone spurs"   VIDEO BRONCHOSCOPY WITH ENDOBRONCHIAL NAVIGATION Right 02/25/2018   Procedure: VIDEO BRONCHOSCOPY WITH ENDOBRONCHIAL NAVIGATION;  Surgeon: Garner Nash, DO;  Location: [facility];  Service: Thoracic;  Laterality: Right;   VIDEO BRONCHOSCOPY WITH ENDOBRONCHIAL ULTRASOUND Right 02/25/2018   Procedure: VIDEO BRONCHOSCOPY WITH ENDOBRONCHIAL ULTRASOUND;  Surgeon: Garner Nash, DO;  Location: [facility];   Service: Thoracic;  Laterality: Right;       Family History  Problem Relation Age of Onset   Stomach cancer Mother        Deceased, 49s   Hypertension Mother    Hypertension Father    Stroke Father    Heart attack Father        Deceased, 22   Aneurysm Brother    Healthy Daughter    Healthy Maternal Grandmother    Leukemia Grandchild    Diabetes Neg Hx    Early death Neg Hx    Colon cancer Neg Hx    Pancreatic cancer Neg Hx     Social History   Tobacco Use   Smoking status: Every Day    Packs/day: 0.50    Years: 49.00    Pack years: 24.50    Types: Cigarettes   Smokeless tobacco: Never   Tobacco comments:    half a pack a day   Vaping Use   Vaping Use: Never used  Substance Use Topics   Alcohol use: Yes    Alcohol/week: 0.0 standard drinks    Comment: 2 drinks a week  Drug use: No    Home Medications Prior to Admission medications   Medication Sig Start Date End Date Taking? Authorizing Provider  acetaminophen (TYLENOL) 500 MG tablet Take 1,000 mg by mouth every 6 (six) hours as needed for mild pain.   Yes [provider]  albuterol (PROAIR HFA) 108 (90 Base) MCG/ACT inhaler USE 2 PUFFS EVERY 6 HOURS  AS NEEDED FOR WHEEZING OR  SHORTNESS OF BREATH Patient taking differently: Inhale 2 puffs into the lungs every 6 (six) hours as needed for wheezing or shortness of breath. 01/19/20  Yes Icard, Bradley L, DO  albuterol (PROVENTIL) (2.5 MG/3ML) 0.083% nebulizer solution Take 3 mLs (2.5 mg total) by nebulization every 6 (six) hours as needed for wheezing or shortness of breath. 09/16/18  Yes Icard, Octavio Graves, DO  aspirin EC 81 MG tablet Take 81 mg by mouth daily.   Yes [provider]  atorvastatin (LIPITOR) 40 MG tablet TAKE 1 TABLET BY MOUTH  DAILY Patient taking differently: Take 40 mg by mouth daily. 09/13/20  Yes Wellington Hampshire, MD  clopidogrel (PLAVIX) 75 MG tablet TAKE 1 TABLET BY MOUTH  DAILY 09/13/20  Yes Arida, Mertie Clause, MD  colchicine 0.6  MG tablet TAKE 2 TABLETS BY MOUTH AS  DIRECTED THEN 1 TABLET  AFTER 1 HOUR. CAN TAKE 1  TABLET TWICE DAILY  THEREAFTER. Patient taking differently: Take 0.6 mg by mouth 2 (two) times daily as needed (gout flare). 11/28/19  Yes Jearld Fenton, NP  fexofenadine (ALLEGRA ALLERGY) 180 MG tablet Take 1 tablet (180 mg total) by mouth daily. 12/19/17  Yes Baity, Coralie Keens, NP  Fluticasone-Umeclidin-Vilant (TRELEGY ELLIPTA) 100-62.5-25 MCG/INH AEPB Inhale 1 puff into the lungs daily. 01/19/20  Yes Icard, Bradley L, DO  gabapentin (NEURONTIN) 300 MG capsule Take 1 capsule (300 mg total) by mouth 3 (three) times daily. 05/11/20  Yes Copland, Frederico Hamman, MD  losartan (COZAAR) 25 MG tablet TAKE 1 TABLET BY MOUTH  DAILY Patient taking differently: Take 25 mg by mouth daily. 07/14/20  Yes Dutch Quint B, FNP  methocarbamol (ROBAXIN) 500 MG tablet Take 500 mg by mouth every 6 (six) hours as needed for muscle spasms. 11/02/20  Yes [provider]  OVER THE COUNTER MEDICATION Apply 1 application topically daily as needed (neuropathy pain). Hemp oil cream   Yes [provider]  traZODone (DESYREL) 100 MG tablet TAKE 1 TABLET BY MOUTH AT  BEDTIME Patient taking differently: Take 100 mg by mouth at bedtime as needed for sleep. 09/24/19  Yes Baity, Coralie Keens, NP  Blood Glucose Monitoring Suppl (ONETOUCH VERIO) w/Device KIT 1 Device by Does not apply route once. 05/19/15   Jearld Fenton, NP  omeprazole (PRILOSEC) 40 MG capsule TAKE 1 CAPSULE BY MOUTH  DAILY Patient not taking: Reported on 11/14/2020 11/14/20   Zehr, Laban Emperor, PA-C  ONE TOUCH LANCETS MISC 1 each by Does not apply route 3 (three) times daily. 05/19/15   Jearld Fenton, NP  ONETOUCH ULTRA test strip USE TO TEST 3 TIMES DAILY 07/14/20   Dutch Quint B, FNP    Allergies    Lisinopril, Amitriptyline, Contrast media [iodinated diagnostic agents], and Ibuprofen  Review of Systems   Review of Systems  All other systems reviewed and are  negative.  Physical Exam Updated Vital Signs BP 123/79   Pulse 91   Temp 98.2 F (36.8 C) (Oral)   Resp (!) 22   SpO2 98%   Physical Exam Vitals and nursing note  reviewed.  Constitutional:      General: He is not in acute distress.    Appearance: Normal appearance. He is well-developed.  HENT:     Head: Normocephalic and atraumatic.  Eyes:     Conjunctiva/sclera: Conjunctivae normal.     Pupils: Pupils are equal, round, and reactive to light.  Cardiovascular:     Rate and Rhythm: Normal rate and regular rhythm.     Heart sounds: Normal heart sounds.  Pulmonary:     Effort: Pulmonary effort is normal. No respiratory distress.     Breath sounds: Normal breath sounds.  Abdominal:     General: There is no distension.     Palpations: Abdomen is soft.     Tenderness: There is no abdominal tenderness.  Genitourinary:    Comments: Patient declines repeat rectal exam.  He reports rectal exam was just performed with our GI earlier this morning. Musculoskeletal:        General: No deformity. Normal range of motion.     Cervical back: Normal range of motion and neck supple.  Skin:    General: Skin is warm and dry.  Neurological:     General: No focal deficit present.     Mental Status: He is alert and oriented to person, place, and time.    ED Results / Procedures / Treatments   Labs (all labs ordered are listed, but only abnormal results are displayed) Labs Reviewed  COMPREHENSIVE METABOLIC PANEL - Abnormal; Notable for the following components:      Result Value   Glucose, Bld 109 (*)    Calcium 8.7 (*)    AST 221 (*)    ALT 63 (*)    Alkaline Phosphatase 169 (*)    All other components within normal limits  CBC WITH DIFFERENTIAL/PLATELET - Abnormal; Notable for the following components:   RBC 3.79 (*)    MCV 104.7 (*)    MCH 36.9 (*)    Platelets 102 (*)    All other components within normal limits  PROTIME-INR - Abnormal; Notable for the following components:    Prothrombin Time 11.0 (*)    All other components within normal limits  LIPASE, BLOOD - Abnormal; Notable for the following components:   Lipase 92 (*)    All other components within normal limits  RESP PANEL BY RT-PCR (FLU A&B, COVID) ARPGX2  POC OCCULT BLOOD, ED  TYPE AND SCREEN    EKG EKG Interpretation  Date/Time:  Tuesday November 14 2020 11:26:49 EDT Ventricular Rate:  87 PR Interval:  145 QRS Duration: 83 QT Interval:  375 QTC Calculation: 452 R Axis:   73 Text Interpretation: Sinus rhythm Low voltage, precordial leads Confirmed by Dene Gentry [id number]) on 11/14/2020 11:33:16 AM  Radiology DG Chest Port 1 View  Result Date: 11/14/2020 CLINICAL DATA:  Weakness EXAM: PORTABLE CHEST 1 VIEW COMPARISON:  CT chest dated Aug 06, 2019 FINDINGS: Cardiac and mediastinal contours are unchanged and within normal limits. Bandlike consolidation of the right upper lobe. Lungs are otherwise clear. No evidence pleural effusion or pneumothorax. IMPRESSION: Bandlike consolidation of the right upper lobe, likely posttreatment changes. Lungs are otherwise clear. Electronically Signed   By: Yetta Glassman M.D.   On: 11/14/2020 12:13    Procedures Procedures   Medications Ordered in ED Medications - No data to display  ED Course  I have reviewed the triage vital signs and the nursing notes.  Pertinent labs & imaging results that were available during my care of  the patient were reviewed by me and considered in my medical decision making (see chart for details).    MDM Rules/Calculators/A&P                           MDM  MSE complete  CHAISE MAHABIR was evaluated in Emergency Department on 11/14/2020 for the symptoms described in the history of present illness. He was evaluated in the context of the global COVID-19 pandemic, which necessitated consideration that the patient might be at risk for infection with the SARS-CoV-2 virus that causes COVID-19. Institutional protocols and  algorithms that pertain to the evaluation of patients at risk for COVID-19 are in a state of rapid change based on information released by regulatory bodies including the CDC and federal and state organizations. These policies and algorithms were followed during the patient's care in the ED.  Patient is presenting with reported dark bloody stool.  This appears to have been ongoing for approximately 2 weeks. Patient with history of duodenal ulcer and hemorrhoids. He takes ASA daily.  Patient was sent from [facility] for evaluation and admission after clinic visit this AM.  Hospitalist service is aware of case and will evaluate.   Final Clinical Impression(s) / ED Diagnoses Final diagnoses:  Gastrointestinal hemorrhage, unspecified gastrointestinal hemorrhage type    Rx / DC Orders ED Discharge Orders     None        Valarie Merino, MD 11/14/20 1238

## 2020-11-14 NOTE — Patient Instructions (Signed)
Go to emergency room.   If you are age 65 or older, your body mass index should be between 23-30. Your Body mass index is 25.99 kg/m. If this is out of the aforementioned range listed, please consider follow up with your Primary Care Provider.  If you are age 74 or younger, your body mass index should be between 19-25. Your Body mass index is 25.99 kg/m. If this is out of the aformentioned range listed, please consider follow up with your Primary Care Provider.   __________________________________________________________  The [facility] providers would like to encourage you to use [facility] to communicate with providers for non-urgent requests or questions.  Due to long hold times on the telephone, sending your provider a message by [facility] may be a faster and more efficient way to get a response.  Please allow 48 business hours for a response.  Please remember that this is for non-urgent requests.

## 2020-11-14 NOTE — Progress Notes (Signed)
   11/14/20 2044  Vitals  BP 109/65  MAP (mmHg) 72  Pulse Rate (!) 127  Pulse Rate Source Monitor  ECG Heart Rate (!) 150  Resp (!) 21  MEWS COLOR  MEWS Score Color Red  Oxygen Therapy  SpO2 94 %  O2 Device Room Air  MEWS Score  MEWS Temp 0  MEWS Systolic 0  MEWS Pulse 3  MEWS RR 1  MEWS LOC 0  MEWS Score 4  Notified MD on call about pt's HR sustaining to 140-150, pt alert, started LR bolus, per order.

## 2020-11-14 NOTE — Progress Notes (Signed)
Chief Complaint: Diarrhea, melena, bright red blood per rectum  HPI:    Joshua [name] is a 65 year old male, assigned to Dr. Henrene Pastor, with a past medical history of CAD status post stent in 2017 on Plavix (no recent follow-up with cardiology), history of lung cancer, GERD, COPD and others listed below, who presents clinic today with a complaint of diarrhea, melena and bright red blood per rectum.    08/26/2008 colonoscopy in [place] with hemorrhoids.    2018 EGD and esophageal manometry.  Esophageal manometry normal.  EGD with erosive gastritis and duodenal ulcer/duodenitis.  Positive for H. pylori.    10/27/2019 office visit with Alonza Bogus for right upper quadrant pain.  At that time also discussed diarrhea for the past 2 days but it just started colchicine for gout flare and has diarrhea when he takes this medicine.  At that time patient had recent ultrasound which showed cholelithiasis and mild gallbladder wall thickening with mild dilation CBD 8 mm.  AST and ALT were slightly elevated.  At that time ordered a CT of the abdomen pelvis.  Discussed his elevated LFTs and fatty liver as well as alcohol usage.    01/19/2020 patient last followed with pulmonology.  At that time diagnosed with moderate COPD.  Continued on maintenance inhalers.    11/13/2020 patient called and reported bright red blood per rectum and diarrhea for 2 weeks.    Today, the patient presents to clinic accompanied by his wife.  He is very ill-appearing.  Explains that he has been off of his Plavix because he cannot afford to see his cardiologist and they will not refill it for him until he does.  He has not been seen there since 2020.  He is in a wheelchair and appears very weak.  Explains to me that he has had diarrhea for the past 2 weeks with at least 20 stools a day.  Sometimes it is clear and other times it is black and sticky looking.  Also describes wiping and seeing bright red blood on the toilet paper.  Over the past month or  so he has had an increase in the number of falls he has had with multiple bruises, one on his eye.  His wife tells me this is due to neuropathy in his feet and legs for which she has not been able to see a doctor about.  Does describe daily indigestion regardless of his Omeprazole 40 mg once a day.  Does continue on Aspirin 81 mg.  Tells me he has had increased shortness of breath and does feel dizzy at times.  Also with associated intermittent abdominal cramping pain and pain in his stomach.    Denies fever, chills, nausea or vomiting.  Past Medical History:  Diagnosis Date   AC (acromioclavicular) joint bone spurs, unspecified laterality 11/25/2016   Arthritis    "back" (05/16/2015)   CAD in native artery, occluded Lcx and rotational atherectomy to LAD with DES 05/17/15 05/18/2015   Cavitary lesion of lung 02/25/2018   Chronic back pain    "all my back"   Chronic bronchitis (HCC)    COPD (chronic obstructive pulmonary disease) ([place])    Diabetes mellitus without complication (HCC)    GERD (gastroesophageal reflux disease)    H/O blood clots    "had them in my back; put filter in before one of my neck ORs"   History of gout    Hyperlipidemia    Hypertension    Neuromuscular disorder ([place])  PAD (peripheral artery disease) (HCC)    Phlebitis    Positive TB test    Seasonal allergies    Shortness of breath    Spinal disease     Past Surgical History:  Procedure Laterality Date   ABDOMINAL AORTOGRAM W/LOWER EXTREMITY  01/07/2018   ABDOMINAL AORTOGRAM W/LOWER EXTREMITY N/A 01/07/2018   Procedure: ABDOMINAL AORTOGRAM W/LOWER EXTREMITY;  Surgeon: Wellington Hampshire, MD;  Location: [facility];  Service: Cardiovascular;  Laterality: N/A;   ANTERIOR CERVICAL DECOMP/DISCECTOMY FUSION  ~ 2001-2009 X 3   ANTERIOR CERVICAL DECOMP/DISCECTOMY FUSION N/A 10/06/2012   Procedure: ANTERIOR CERVICAL DECOMPRESSION/DISCECTOMY FUSION 1 LEVEL;  Surgeon: [name], MD;  Location: [facility];   Service: Neurosurgery;  Laterality: N/A;  C7T1 anterior cervical decompression with fusion plating and bonegraft    BACK SURGERY     CARDIAC CATHETERIZATION  05/16/2015   CARDIAC CATHETERIZATION N/A 05/16/2015   Procedure: Left Heart Cath and Coronary Angiography;  Surgeon: [name], MD;  Location: [facility];  Service: Cardiovascular;  Laterality: N/A;   CARDIAC CATHETERIZATION N/A 05/16/2015   Procedure: Coronary Balloon Angioplasty;  Surgeon: [name], MD;  Location: [facility];  Service: Cardiovascular;  Laterality: N/A;   CARDIAC CATHETERIZATION N/A 05/17/2015   Procedure: Coronary Stent Intervention Rotoblater;  Surgeon: [name], MD;  Location: [facility];  Service: Cardiovascular;  Laterality: N/A;   ELBOW SURGERY Left 2016   "had bone pieces removed"   ESOPHAGEAL MANOMETRY N/A 12/30/2016   Procedure: ESOPHAGEAL MANOMETRY (EM);  Surgeon: Irene Shipper, MD;  Location: [facility];  Service: Endoscopy;  Laterality: N/A;   EXCISIONAL HEMORRHOIDECTOMY     FEMORAL BYPASS  ~ 2010   IR RADIOLOGIST EVAL & MGMT  12/17/2016   IVC FILTER PLACEMENT ([facility])  2010   "had blood clots in my back"   NECK SURGERY  2014   2008-2014   PERIPHERAL VASCULAR ATHERECTOMY  01/07/2018   Procedure: PERIPHERAL VASCULAR ATHERECTOMY;  Surgeon: Wellington Hampshire, MD;  Location: [facility];  Service: Cardiovascular;;   PERIPHERAL VASCULAR BALLOON ANGIOPLASTY  01/07/2018   Procedure: PERIPHERAL VASCULAR BALLOON ANGIOPLASTY;  Surgeon: Wellington Hampshire, MD;  Location: [facility];  Service: Cardiovascular;;   PERIPHERAL VASCULAR CATHETERIZATION N/A 02/14/2016   Procedure: Abdominal Aortogram w/Lower Extremity;  Surgeon: Wellington Hampshire, MD;  Location: [facility];  Service: Cardiovascular;  Laterality: N/A;   POSTERIOR LAMINECTOMY / DECOMPRESSION LUMBAR SPINE  2006   "bone spurs"   VIDEO BRONCHOSCOPY WITH ENDOBRONCHIAL NAVIGATION Right 02/25/2018    Procedure: VIDEO BRONCHOSCOPY WITH ENDOBRONCHIAL NAVIGATION;  Surgeon: Garner Nash, DO;  Location: [facility];  Service: Thoracic;  Laterality: Right;   VIDEO BRONCHOSCOPY WITH ENDOBRONCHIAL ULTRASOUND Right 02/25/2018   Procedure: VIDEO BRONCHOSCOPY WITH ENDOBRONCHIAL ULTRASOUND;  Surgeon: Garner Nash, DO;  Location: [facility];  Service: Thoracic;  Laterality: Right;    Current Outpatient Medications  Medication Sig Dispense Refill   albuterol (PROAIR HFA) 108 (90 Base) MCG/ACT inhaler USE 2 PUFFS EVERY 6 HOURS  AS NEEDED FOR WHEEZING OR  SHORTNESS OF BREATH 34 g 3   albuterol (PROVENTIL) (2.5 MG/3ML) 0.083% nebulizer solution Take 3 mLs (2.5 mg total) by nebulization every 6 (six) hours as needed for wheezing or shortness of breath. 75 mL 5   aspirin EC 81 MG tablet Take 81 mg by mouth daily.     atorvastatin (LIPITOR) 40 MG tablet TAKE  1 TABLET BY MOUTH  DAILY 15 tablet 0   Blood Glucose Monitoring Suppl (ONETOUCH VERIO) w/Device KIT 1 Device by Does not apply route once. 1 kit 0   clopidogrel (PLAVIX) 75 MG tablet TAKE 1 TABLET BY MOUTH  DAILY 15 tablet 0   colchicine 0.6 MG tablet TAKE 2 TABLETS BY MOUTH AS  DIRECTED THEN 1 TABLET  AFTER 1 HOUR. CAN TAKE 1  TABLET TWICE DAILY  THEREAFTER. 60 tablet 11   cyclobenzaprine (FLEXERIL) 10 MG tablet Take 1 tablet (10 mg total) by mouth 3 (three) times daily as needed for muscle spasms. 30 tablet 0   fexofenadine (ALLEGRA ALLERGY) 180 MG tablet Take 1 tablet (180 mg total) by mouth daily. 90 tablet 3   Fluticasone-Umeclidin-Vilant (TRELEGY ELLIPTA) 100-62.5-25 MCG/INH AEPB Inhale 1 puff into the lungs daily. 2 each 5   gabapentin (NEURONTIN) 300 MG capsule Take 1 capsule (300 mg total) by mouth 3 (three) times daily. 90 capsule 3   losartan (COZAAR) 25 MG tablet TAKE 1 TABLET BY MOUTH  DAILY 90 tablet 0   Omega-3 Fatty Acids (FISH OIL) 1000 MG CAPS Take 1,000 mg by mouth daily.      omeprazole (PRILOSEC) 40 MG capsule TAKE 1 CAPSULE BY MOUTH  DAILY  90 capsule 3   ONE TOUCH LANCETS MISC 1 each by Does not apply route 3 (three) times daily. 200 each 5   ONETOUCH ULTRA test strip USE TO TEST 3 TIMES DAILY 300 strip 0   OVER THE COUNTER MEDICATION Apply 1 application topically daily as needed (neuropathy pain). Hemp oil cream     traZODone (DESYREL) 100 MG tablet TAKE 1 TABLET BY MOUTH AT  BEDTIME 90 tablet 2   diphenhydrAMINE (BENADRYL) 50 MG tablet Take 0.5 tablets (25 mg total) by mouth once. 1 hr prior to CT scan 30 tablet 0   No current facility-administered medications for this visit.    Allergies as of 11/14/2020 - Review Complete 11/14/2020  Allergen Reaction Noted   Lisinopril Hives and Itching 07/27/2014   Amitriptyline Other (See Comments) 05/15/2016   Contrast media [iodinated diagnostic agents] Hives and Itching 11/20/2011   Ibuprofen Other (See Comments) 11/20/2011    Family History  Problem Relation Age of Onset   Stomach cancer Mother        Deceased, 40s   Hypertension Mother    Hypertension Father    Stroke Father    Heart attack Father        Deceased, 50   Aneurysm Brother    Healthy Daughter    Healthy Maternal Grandmother    Leukemia Grandchild    Diabetes Neg Hx    Early death Neg Hx    Colon cancer Neg Hx    Pancreatic cancer Neg Hx     Social History   Socioeconomic History   Marital status: Married    Spouse name: Not on file   Number of children: Not on file   Years of education: 7   Highest education level: Not on file  Occupational History   Occupation: Disabled    Employer: DISABLED  Tobacco Use   Smoking status: Every Day    Packs/day: 0.50    Years: 49.00    Pack years: 24.50    Types: Cigarettes   Smokeless tobacco: Never   Tobacco comments:    half a pack a day   Vaping Use   Vaping Use: Never used  Substance and Sexual Activity   Alcohol use: Yes  Alcohol/week: 0.0 standard drinks    Comment: 2 drinks a week   Drug use: No   Sexual activity: Not Currently  Other  Topics Concern   Not on file  Social History Narrative   Regular exercise-no   Caffeine Use-yes   Lives with wife in a one story home. Has 2 children.  On disability for low back pain.  Used to work as a Games developer, lasted worked in 2007.     Education: 7th grade.   Social Determinants of Health   Financial Resource Strain: Not on file  Food Insecurity: Not on file  Transportation Needs: Not on file  Physical Activity: Not on file  Stress: Not on file  Social Connections: Not on file  Intimate Partner Violence: Not on file    Review of Systems:    Constitutional: No weight loss, fever or chills Skin: No rash  Cardiovascular: No chest pain  Respiratory: +SOB, DOE Gastrointestinal: See HPI and otherwise negative Genitourinary: No dysuria  Neurological: +dizziness and falls Musculoskeletal: +neuropathy Hematologic: +melena and hematochezia Psychiatric: No history of depression or anxiety   Physical Exam:  Vital signs: BP 100/68   Pulse (!) 105   Ht '5\' 9"'  (1.753 m)   Wt 176 lb (79.8 kg)   BMI 25.99 kg/m    Constitutional:   Pleasant very ill-appearing Caucasian male appears to be in mild distress, Well developed, thin, alert and cooperative Head:  Normocephalic and atraumatic. Eyes:   PEERL, EOMI. No icterus. Conjunctiva pink.+ Ecchymosis around the right eye Ears:  Normal auditory acuity. Neck:  Supple Throat: Oral cavity and pharynx without inflammation, swelling or lesion.  Respiratory: Respirations even, increased work of breathing, wheezing and rhonchi in bilateral lungs Cardiovascular: Normal S1, S2. No MRG. Regular rate and rhythm. No peripheral edema, cyanosis or pallor.  Gastrointestinal:  Soft, nondistended, marked TTP in the epigastrium with involuntary guarding normal bowel sounds. No appreciable masses or hepatomegaly. Rectal: External: Visible string of maroon/bright red blood from rectum; internal: No mass, bloody residue on glove, grade 1 internal  hemorrhoids Msk:  Symmetrical without gross deformities. Without edema, no deformity or joint abnormality. + Bruising on knees, in wheelchair Neurologic:  Alert and  oriented x4 Skin:   Dry and intact without significant lesions or rashes. Psychiatric: Demonstrates good judgement and reason without abnormal affect or behaviors.  No recent labs or imaging.  Assessment: 1.  Melena/hematochezia: Over the past 2 weeks with an increase in falls; concern for symptomatic anemia from GI blood loss 2.  CAD status post stent: Off of Plavix as he has not been able to afford a cardiology visit which he tells me cost $30, currently on Aspirin 81 mg 3.  GERD: Increase in symptoms over the past few weeks, with description of melena and untreated H. pylori history; consider PUD 4.  Diarrhea: 20 times a day for the past 2 weeks per patient; consider infectious cause most likely versus blood in the GI tract 5.  Abdominal pain: Generalized, sometimes prior to a bowel movement, epigastric at time of exam today; concern for ongoing H. pylori gastritis and PUD with possible bleeding+/- lower etiology  Plan: 1.  Unfortunately patient has not sought care due to inability to pay.  Discussed with patient that he does not sound or look good today.  I fear that his COPD is worsened and he may have underlying infection, also concern for GI bleed given hematochezia at time of exam today and description of melena at home with  history of H. pylori gastritis which was never treated (due to cost of medication).  Discussed with patient that I would recommend that he go immediately to the ER.  There he can have full work-up and likely be hospitalized to correct everything he has going on.  There he can discuss with social work further about ways to pay for his care, possibly a red card or other program.  He verbalized understanding and his wife will drive him to the ER. 2.  Patient to follow in our clinic per recommendations from the  ER at time of visit today.  Certainly he likely needs procedures, but is in no state to have them done in an outpatient setting.  If it is deemed necessary during his hospitalization then please call our inpatient team for consultation. 3.  At a minimum if patient does not require hospitalization he will require a twice daily PPI at 40 mg, CBC, CMP and updated labs as well as cardiac consult and discussion about staying on Plavix.  He is also overdue to be treated for his H. pylori gastritis.  We will also need testing with a GI pathogen panel to include C. Difficile for his diarrhea.  Ellouise Newer, PA-C [facility] 11/14/2020, 10:04 AM  Cc: Jearld Fenton, NP

## 2020-11-14 NOTE — H&P (Signed)
History and Physical    DOA: 11/14/2020  PCP: Jearld Fenton, NP --scheduled to see [facility] next month Patient coming from: home  Chief Complaint: Dark stools x 2 weeks.   HPI: Joshua [name] is a 65 y.o. male with history h/o COPD, lung ca,  CAD s/p PTCA 2017 who hasnt been taking plavix but still on ASA, GERD, duodenal ulcer/duodenitis in 2018 -non compliant with PPI presented today to GI clinic with c/o dyspepsia, nausea, loose dark stools x 2 weeks associated with some abdominal discomfort.  He states initially it was black tarry stool but lately he has noted intermittent hematochezia.  On some days he has had as many as 20 bowel movements.  He had history of hemorrhoids for which he underwent surgery in the 1990s and states he was doing pretty good without hematochezia until recently.  He denies any hematemesis.  Patient reports heavy alcohol use-a pint of vodka lasts him 1 day.  Patient states he has been on Plavix since he had 2 stents placed in 2017 but lost insurance recently and unable to afford cardiology follow-up visits or Plavix prescription.  He however has continued to take aspirin. Per GI note, patient underwent EGD and esophageal manometry in 2018.  Esophageal manometry normal.  EGD with erosive gastritis and duodenal ulcer/duodenitis.  Positive for H. pylori.  Review of Systems: As per HPI, otherwise review of systems negative.    Past Medical History:  Diagnosis Date   AC (acromioclavicular) joint bone spurs, unspecified laterality 11/25/2016   Arthritis    "back" (05/16/2015)   CAD in native artery, occluded Lcx and rotational atherectomy to LAD with DES 05/17/15 05/18/2015   Cavitary lesion of lung 02/25/2018   Chronic back pain    "all my back"   Chronic bronchitis (HCC)    COPD (chronic obstructive pulmonary disease) ([place])    Diabetes mellitus without complication (HCC)    GERD (gastroesophageal reflux disease)    H/O blood clots    "had them in my  back; put filter in before one of my neck ORs"   History of gout    Hyperlipidemia    Hypertension    Neuromuscular disorder ([place])    PAD (peripheral artery disease) (HCC)    Phlebitis    Positive TB test    Seasonal allergies    Shortness of breath    Spinal disease     Past Surgical History:  Procedure Laterality Date   ABDOMINAL AORTOGRAM W/LOWER EXTREMITY  01/07/2018   ABDOMINAL AORTOGRAM W/LOWER EXTREMITY N/A 01/07/2018   Procedure: ABDOMINAL AORTOGRAM W/LOWER EXTREMITY;  Surgeon: Wellington Hampshire, MD;  Location: [place] [facility];  Service: Cardiovascular;  Laterality: N/A;   ANTERIOR CERVICAL DECOMP/DISCECTOMY FUSION  ~ 2001-2009 X 3   ANTERIOR CERVICAL DECOMP/DISCECTOMY FUSION N/A 10/06/2012   Procedure: ANTERIOR CERVICAL DECOMPRESSION/DISCECTOMY FUSION 1 LEVEL;  Surgeon: [name], MD;  Location: [place] [facility];  Service: Neurosurgery;  Laterality: N/A;  C7T1 anterior cervical decompression with fusion plating and bonegraft    BACK SURGERY     CARDIAC CATHETERIZATION  05/16/2015   CARDIAC CATHETERIZATION N/A 05/16/2015   Procedure: Left Heart Cath and Coronary Angiography;  Surgeon: [name], MD;  Location: [place] [facility];  Service: Cardiovascular;  Laterality: N/A;   CARDIAC CATHETERIZATION N/A 05/16/2015   Procedure: Coronary Balloon Angioplasty;  Surgeon: [name], MD;  Location: [place] [facility];  Service: Cardiovascular;  Laterality: N/A;   CARDIAC CATHETERIZATION N/A  05/17/2015   Procedure: Coronary Stent Intervention Rotoblater;  Surgeon: [name], MD;  Location: [place] [facility];  Service: Cardiovascular;  Laterality: N/A;   ELBOW SURGERY Left 2016   "had bone pieces removed"   ESOPHAGEAL MANOMETRY N/A 12/30/2016   Procedure: ESOPHAGEAL MANOMETRY (EM);  Surgeon: Irene Shipper, MD;  Location: [facility];  Service: Endoscopy;  Laterality: N/A;   EXCISIONAL HEMORRHOIDECTOMY     FEMORAL BYPASS  ~ 2010   IR RADIOLOGIST EVAL & MGMT   12/17/2016   IVC FILTER PLACEMENT ([place] [facility])  2010   "had blood clots in my back"   NECK SURGERY  2014   2008-2014   PERIPHERAL VASCULAR ATHERECTOMY  01/07/2018   Procedure: PERIPHERAL VASCULAR ATHERECTOMY;  Surgeon: Wellington Hampshire, MD;  Location: [place] [facility];  Service: Cardiovascular;;   PERIPHERAL VASCULAR BALLOON ANGIOPLASTY  01/07/2018   Procedure: PERIPHERAL VASCULAR BALLOON ANGIOPLASTY;  Surgeon: Wellington Hampshire, MD;  Location: [place] [facility];  Service: Cardiovascular;;   PERIPHERAL VASCULAR CATHETERIZATION N/A 02/14/2016   Procedure: Abdominal Aortogram w/Lower Extremity;  Surgeon: Wellington Hampshire, MD;  Location: [place] [facility];  Service: Cardiovascular;  Laterality: N/A;   POSTERIOR LAMINECTOMY / DECOMPRESSION LUMBAR SPINE  2006   "bone spurs"   VIDEO BRONCHOSCOPY WITH ENDOBRONCHIAL NAVIGATION Right 02/25/2018   Procedure: VIDEO BRONCHOSCOPY WITH ENDOBRONCHIAL NAVIGATION;  Surgeon: Garner Nash, DO;  Location: [place];  Service: Thoracic;  Laterality: Right;   VIDEO BRONCHOSCOPY WITH ENDOBRONCHIAL ULTRASOUND Right 02/25/2018   Procedure: VIDEO BRONCHOSCOPY WITH ENDOBRONCHIAL ULTRASOUND;  Surgeon: Garner Nash, DO;  Location: [place];  Service: Thoracic;  Laterality: Right;    Social history:  reports that he has been smoking cigarettes. He has a 24.50 pack-year smoking history. He has never used smokeless tobacco. He reports current alcohol use. He reports that he does not use drugs.   Allergies  Allergen Reactions   Lisinopril Hives and Itching   Amitriptyline Other (See Comments)    Makes pt feel "weird" and nausea.   Contrast Media [Iodinated Diagnostic Agents] Hives and Itching    Pt can use if taking benadryl    Ibuprofen Other (See Comments)    Internal bleeding    Family History  Problem Relation Age of Onset   Stomach cancer Mother        Deceased, 37s   Hypertension Mother    Hypertension Father    Stroke Father    Heart attack Father         Deceased, 18   Aneurysm Brother    Healthy Daughter    Healthy Maternal Grandmother    Leukemia Grandchild    Diabetes Neg Hx    Early death Neg Hx    Colon cancer Neg Hx    Pancreatic cancer Neg Hx       Prior to Admission medications   Medication Sig Start Date End Date Taking? Authorizing Provider  acetaminophen (TYLENOL) 500 MG tablet Take 1,000 mg by mouth every 6 (six) hours as needed for mild pain.   Yes [provider]  albuterol (PROAIR HFA) 108 (90 Base) MCG/ACT inhaler USE 2 PUFFS EVERY 6 HOURS  AS NEEDED FOR WHEEZING OR  SHORTNESS OF BREATH Patient taking differently: Inhale 2 puffs into the lungs every 6 (six) hours as needed for wheezing or shortness of breath. 01/19/20  Yes Icard, Bradley L, DO  albuterol (PROVENTIL) (2.5 MG/3ML) 0.083% nebulizer solution Take 3 mLs (2.5 mg total) by nebulization  every 6 (six) hours as needed for wheezing or shortness of breath. 09/16/18  Yes Icard, Octavio Graves, DO  aspirin EC 81 MG tablet Take 81 mg by mouth daily.   Yes [provider]  atorvastatin (LIPITOR) 40 MG tablet TAKE 1 TABLET BY MOUTH  DAILY Patient taking differently: Take 40 mg by mouth daily. 09/13/20  Yes Wellington Hampshire, MD  clopidogrel (PLAVIX) 75 MG tablet TAKE 1 TABLET BY MOUTH  DAILY 09/13/20  Yes Arida, Mertie Clause, MD  colchicine 0.6 MG tablet TAKE 2 TABLETS BY MOUTH AS  DIRECTED THEN 1 TABLET  AFTER 1 HOUR. CAN TAKE 1  TABLET TWICE DAILY  THEREAFTER. Patient taking differently: Take 0.6 mg by mouth 2 (two) times daily as needed (gout flare). 11/28/19  Yes Jearld Fenton, NP  fexofenadine (ALLEGRA ALLERGY) 180 MG tablet Take 1 tablet (180 mg total) by mouth daily. 12/19/17  Yes Baity, Coralie Keens, NP  Fluticasone-Umeclidin-Vilant (TRELEGY ELLIPTA) 100-62.5-25 MCG/INH AEPB Inhale 1 puff into the lungs daily. 01/19/20  Yes Icard, Bradley L, DO  gabapentin (NEURONTIN) 300 MG capsule Take 1 capsule (300 mg total) by mouth 3 (three) times daily. 05/11/20  Yes  Copland, Frederico Hamman, MD  losartan (COZAAR) 25 MG tablet TAKE 1 TABLET BY MOUTH  DAILY Patient taking differently: Take 25 mg by mouth daily. 07/14/20  Yes Dutch Quint B, FNP  methocarbamol (ROBAXIN) 500 MG tablet Take 500 mg by mouth every 6 (six) hours as needed for muscle spasms. 11/02/20  Yes [provider]  OVER THE COUNTER MEDICATION Apply 1 application topically daily as needed (neuropathy pain). Hemp oil cream   Yes [provider]  traZODone (DESYREL) 100 MG tablet TAKE 1 TABLET BY MOUTH AT  BEDTIME Patient taking differently: Take 100 mg by mouth at bedtime as needed for sleep. 09/24/19  Yes Baity, Coralie Keens, NP  Blood Glucose Monitoring Suppl (ONETOUCH VERIO) w/Device KIT 1 Device by Does not apply route once. 05/19/15   Jearld Fenton, NP  omeprazole (PRILOSEC) 40 MG capsule TAKE 1 CAPSULE BY MOUTH  DAILY Patient not taking: Reported on 11/14/2020 11/14/20   Zehr, Laban Emperor, PA-C  ONE TOUCH LANCETS MISC 1 each by Does not apply route 3 (three) times daily. 05/19/15   Jearld Fenton, NP  ONETOUCH ULTRA test strip USE TO TEST 3 TIMES DAILY 07/14/20   Dutch Quint B, FNP    Physical Exam: Vitals:   11/14/20 1048 11/14/20 1130 11/14/20 1230  BP: 112/73 123/79 (!) 143/76  Pulse: (!) 103 91 84  Resp: 18 (!) 22 15  Temp: 98.2 F (36.8 C)    TempSrc: Oral    SpO2: 93% 98% 94%    Constitutional: NAD, calm, comfortable-tobacco breath. Eyes: Supraorbital/orbital bruising along right eye.  PERRL, and conjunctivae normal ENMT: Mucous membranes are moist. Posterior pharynx clear of any exudate or lesions.Normal dentition.  Neck: normal, supple, no masses, no thyromegaly Respiratory: Mild wheezing on auscultation with scattered rhonchi, no crackles. Normal respiratory effort. No accessory muscle use.  Cardiovascular: Regular rate and rhythm, no murmurs / rubs / gallops. No extremity edema. 2+ pedal pulses. No carotid bruits.  Abdomen: Mild epigastric tenderness, no masses  palpated. No hepatosplenomegaly. Bowel sounds positive.  Musculoskeletal: no clubbing / cyanosis-some discoloration noted along right foot/third toe related to recent fall and ecchymosis.  No other joint deformity upper and lower extremities. Good ROM, no contractures. Normal muscle tone.  Neurologic: CN 2-12 grossly intact. Sensation intact, DTR normal. Strength 5/5  in all 4.  Psychiatric: Normal judgment and insight. Alert and oriented x 3. Normal mood.  SKIN/catheters: Abrasions along bilateral knees, bruising/ecchymosis along right eyelid/toe  Labs on Admission: I have personally reviewed following labs and imaging studies  CBC: Recent Labs  Lab 11/14/20 1115  WBC 5.9  NEUTROABS 3.6  HGB 14.0  HCT 39.7  MCV 104.7*  PLT 482*   Basic Metabolic Panel: Recent Labs  Lab 11/14/20 1115  NA 141  K 3.5  CL 102  CO2 24  GLUCOSE 109*  BUN 10  CREATININE 0.73  CALCIUM 8.7*   GFR: Estimated Creatinine Clearance: 93.3 mL/min (by C-G formula based on SCr of 0.73 mg/dL). Recent Labs  Lab 11/14/20 1115  WBC 5.9   Liver Function Tests: Recent Labs  Lab 11/14/20 1115  AST 221*  ALT 63*  ALKPHOS 169*  BILITOT 1.0  PROT 6.6  ALBUMIN 3.5   Recent Labs  Lab 11/14/20 1115  LIPASE 92*   No results for input(s): AMMONIA in the last 168 hours. Coagulation Profile: Recent Labs  Lab 11/14/20 1115  INR 0.8   Cardiac Enzymes: No results for input(s): CKTOTAL, CKMB, CKMBINDEX, TROPONINI in the last 168 hours. BNP (last 3 results) No results for input(s): PROBNP in the last 8760 hours. HbA1C: No results for input(s): HGBA1C in the last 72 hours. CBG: No results for input(s): GLUCAP in the last 168 hours. Lipid Profile: No results for input(s): CHOL, HDL, LDLCALC, TRIG, CHOLHDL, LDLDIRECT in the last 72 hours. Thyroid Function Tests: No results for input(s): TSH, T4TOTAL, FREET4, T3FREE, THYROIDAB in the last 72 hours. Anemia Panel: No results for input(s): VITAMINB12,  FOLATE, FERRITIN, TIBC, IRON, RETICCTPCT in the last 72 hours. Urine analysis:    Component Value Date/Time   BILIRUBINUR Negative 10/13/2019 1445   PROTEINUR Positive (A) 10/13/2019 1445   UROBILINOGEN 1.0 10/13/2019 1445   NITRITE Negative 10/13/2019 1445   LEUKOCYTESUR Negative 10/13/2019 1445    Radiological Exams on Admission: Personally reviewed  DG Chest Port 1 View  Result Date: 11/14/2020 CLINICAL DATA:  Weakness EXAM: PORTABLE CHEST 1 VIEW COMPARISON:  CT chest dated Aug 06, 2019 FINDINGS: Cardiac and mediastinal contours are unchanged and within normal limits. Bandlike consolidation of the right upper lobe. Lungs are otherwise clear. No evidence pleural effusion or pneumothorax. IMPRESSION: Bandlike consolidation of the right upper lobe, likely posttreatment changes. Lungs are otherwise clear. Electronically Signed   By: Yetta Glassman M.D.   On: 11/14/2020 12:13    EKG: Independently reviewed. NSR, QTC 452 ms     Assessment and Plan:   Active Problems:   GIB (gastrointestinal bleeding)    1.UGIB: Patient with prior h/o duodenitis/duodenal ulcer and been non compliant with PPI, continues to drink alcohol heavily.  Alcoholic gastritis versus recurrent ulcer disease high on differential.  Will admit with NPO/IVF and IV PPI. Supportive therapy with antiemetics. Advance diet as tolerated if GI does not anticipate any IP procedures. Hgb 17->14 currently, monitor H&H q8hrs and transfuse < 7 or 8 (cardiac h/o).  GI consulted.  2. CAD: Not been taking plavix due to financial barriers limiting Cardiology clinic visits/refill on meds. Last PTCA in 2018, still on ASA. Hold antiplatelet agents for now in view of #1.  3.  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 On neurontin now, according to wife patient did better with Lyrica but could not afford it anymore.. Monitor Hgb and volume status in view of #1.  PT evaluation.  Has a  cane but does not use consistently.   Neuropathy and alcohol abuse contributing.  4. Elevated liver enzymes: Evaluated by [facility] in 10/2019 and felt to be related to alcohol use/fatty liver . He underwent CT abd/RUQ USG at that time showing gall stones , mild CBD dilation at 63m.  Would avoid Librium, can use Ativan with withdrawal protocol.  Avoid other hepatotoxins.  Counseled to quit alcohol use but motivation/intent is questionable.  5. COPD : stable, resume home inhaler therapy.  Nebs as needed, mild wheezing on exam.  Follows Pulmonary clinic  6. Arthritis: Avoid NSAIDs given h/o PUD.  Has history of gout but takes colchicine only occasionally-has not needed any in the last year.  7. HTN/Hyperlipidemia: resume home meds , watch BP with #1  8. Diabetes Mellitus with Neuropathy: Per patient-diet controlled, not on any home meds. Monitor BG while here.  Resumed Neurontin.  9.  Polysubstance abuse: Admits to continued smoking (half pack per day) and drinking heavily-hard liquor/vodka almost a pint a day.  He understands this could be contributing to neuropathy, problem #1 and can worsen problem #2 and 3.  Advised the risks of worsening COPD and CAD with continued smoking as well.  Counseled to quit, wife at bedside and agrees that he needs motivation to achieve complete abstinence.  CIWA protocol, nicotine patch while here  DVT prophylaxis: SCD  COVID screen: Pending  Code Status: Full code.Health care proxy would be wife   Patient/Family Communication: Discussed with patient and all questions answered to satisfaction.  Consults called: [facility] will see patient--Paul GChester Holsteinnotified Admission status :Patient will be admitted under OBSERVATION status.The patient's presenting symptoms, physical exam findings, and initial radiographic and laboratory data in the context of their medical condition is felt to place them at low risk for further clinical deterioration. Furthermore, it is anticipated that the patient will be medically  stable for discharge from the hospital within 2 midnights of hospital stay.    Addendum 6:40 PM: Patient tachycardic with new onset A. fib on telemetry heart rate 160s.  Will give Cardizem 10 mg IV x1.  Mag critically low at 0.8->replace IV 2 g x3 rounds.  Not a candidate for anticoagulation with GI bleed.  May need Cardizem infusion if persistent A. fib.   NGuilford ShiMD [facility] Pager in [place] If 7PM-7AM, please contact night-coverage [email or web address]   11/14/2020, 12:56 PM

## 2020-11-15 ENCOUNTER — Inpatient Hospital Stay (HOSPITAL_COMMUNITY): Payer: Medicare Other

## 2020-11-15 ENCOUNTER — Observation Stay (HOSPITAL_COMMUNITY): Payer: Medicare Other

## 2020-11-15 DIAGNOSIS — Z20822 Contact with and (suspected) exposure to covid-19: Secondary | ICD-10-CM | POA: Diagnosis present

## 2020-11-15 DIAGNOSIS — F1010 Alcohol abuse, uncomplicated: Secondary | ICD-10-CM | POA: Diagnosis not present

## 2020-11-15 DIAGNOSIS — F17210 Nicotine dependence, cigarettes, uncomplicated: Secondary | ICD-10-CM | POA: Diagnosis present

## 2020-11-15 DIAGNOSIS — K921 Melena: Secondary | ICD-10-CM

## 2020-11-15 DIAGNOSIS — E876 Hypokalemia: Secondary | ICD-10-CM | POA: Diagnosis present

## 2020-11-15 DIAGNOSIS — I4891 Unspecified atrial fibrillation: Secondary | ICD-10-CM | POA: Diagnosis not present

## 2020-11-15 DIAGNOSIS — L03113 Cellulitis of right upper limb: Secondary | ICD-10-CM | POA: Diagnosis present

## 2020-11-15 DIAGNOSIS — K254 Chronic or unspecified gastric ulcer with hemorrhage: Secondary | ICD-10-CM | POA: Diagnosis not present

## 2020-11-15 DIAGNOSIS — Z95828 Presence of other vascular implants and grafts: Secondary | ICD-10-CM | POA: Diagnosis not present

## 2020-11-15 DIAGNOSIS — D696 Thrombocytopenia, unspecified: Secondary | ICD-10-CM | POA: Diagnosis not present

## 2020-11-15 DIAGNOSIS — K7010 Alcoholic hepatitis without ascites: Secondary | ICD-10-CM | POA: Diagnosis present

## 2020-11-15 DIAGNOSIS — J449 Chronic obstructive pulmonary disease, unspecified: Secondary | ICD-10-CM | POA: Diagnosis present

## 2020-11-15 DIAGNOSIS — D62 Acute posthemorrhagic anemia: Secondary | ICD-10-CM | POA: Diagnosis not present

## 2020-11-15 DIAGNOSIS — I10 Essential (primary) hypertension: Secondary | ICD-10-CM

## 2020-11-15 DIAGNOSIS — E1151 Type 2 diabetes mellitus with diabetic peripheral angiopathy without gangrene: Secondary | ICD-10-CM | POA: Diagnosis present

## 2020-11-15 DIAGNOSIS — K922 Gastrointestinal hemorrhage, unspecified: Secondary | ICD-10-CM | POA: Diagnosis present

## 2020-11-15 DIAGNOSIS — I2510 Atherosclerotic heart disease of native coronary artery without angina pectoris: Secondary | ICD-10-CM | POA: Diagnosis present

## 2020-11-15 DIAGNOSIS — I480 Paroxysmal atrial fibrillation: Secondary | ICD-10-CM | POA: Diagnosis present

## 2020-11-15 DIAGNOSIS — K644 Residual hemorrhoidal skin tags: Secondary | ICD-10-CM | POA: Diagnosis present

## 2020-11-15 DIAGNOSIS — K648 Other hemorrhoids: Secondary | ICD-10-CM | POA: Diagnosis present

## 2020-11-15 DIAGNOSIS — K2100 Gastro-esophageal reflux disease with esophagitis, without bleeding: Secondary | ICD-10-CM | POA: Diagnosis present

## 2020-11-15 DIAGNOSIS — M7989 Other specified soft tissue disorders: Secondary | ICD-10-CM | POA: Diagnosis not present

## 2020-11-15 DIAGNOSIS — K635 Polyp of colon: Secondary | ICD-10-CM | POA: Diagnosis present

## 2020-11-15 DIAGNOSIS — E1142 Type 2 diabetes mellitus with diabetic polyneuropathy: Secondary | ICD-10-CM | POA: Diagnosis present

## 2020-11-15 DIAGNOSIS — F10231 Alcohol dependence with withdrawal delirium: Secondary | ICD-10-CM | POA: Diagnosis present

## 2020-11-15 DIAGNOSIS — K449 Diaphragmatic hernia without obstruction or gangrene: Secondary | ICD-10-CM | POA: Diagnosis present

## 2020-11-15 DIAGNOSIS — B9681 Helicobacter pylori [H. pylori] as the cause of diseases classified elsewhere: Secondary | ICD-10-CM | POA: Diagnosis present

## 2020-11-15 DIAGNOSIS — E785 Hyperlipidemia, unspecified: Secondary | ICD-10-CM | POA: Diagnosis present

## 2020-11-15 DIAGNOSIS — B159 Hepatitis A without hepatic coma: Secondary | ICD-10-CM | POA: Diagnosis present

## 2020-11-15 DIAGNOSIS — K259 Gastric ulcer, unspecified as acute or chronic, without hemorrhage or perforation: Secondary | ICD-10-CM | POA: Diagnosis not present

## 2020-11-15 LAB — BASIC METABOLIC PANEL
Anion gap: 10 (ref 5–15)
BUN: 13 mg/dL (ref 8–23)
CO2: 28 mmol/L (ref 22–32)
Calcium: 8.3 mg/dL — ABNORMAL LOW (ref 8.9–10.3)
Chloride: 102 mmol/L (ref 98–111)
Creatinine, Ser: 0.73 mg/dL (ref 0.61–1.24)
GFR, Estimated: >60 mL/min (ref >60)
Glucose, Bld: 100 mg/dL — ABNORMAL HIGH (ref 70–99)
Potassium: 3.3 mmol/L — ABNORMAL LOW (ref 3.5–5.1)
Sodium: 140 mmol/L (ref 135–145)

## 2020-11-15 LAB — GLUCOSE, CAPILLARY
Glucose-Capillary: 98 mg/dL (ref 70–99)
Glucose-Capillary: 153 mg/dL — ABNORMAL HIGH (ref 70–99)
Glucose-Capillary: 129 mg/dL — ABNORMAL HIGH (ref 70–99)

## 2020-11-15 LAB — MAGNESIUM: Magnesium: 2.6 mg/dL — ABNORMAL HIGH (ref 1.7–2.4)

## 2020-11-15 LAB — HEPATITIS C ANTIBODY: HCV Ab: NONREACTIVE

## 2020-11-15 LAB — ECHOCARDIOGRAM COMPLETE
Height: 69 in
S' Lateral: 3.2 cm
Weight: 2816 oz

## 2020-11-15 LAB — HEPATITIS A ANTIBODY, TOTAL: hep A Total Ab: REACTIVE — AB

## 2020-11-15 LAB — HEMOGLOBIN AND HEMATOCRIT, BLOOD
HCT: 33.3 % — ABNORMAL LOW (ref 39.0–52.0)
HCT: 33.3 % — ABNORMAL LOW (ref 39.0–52.0)
Hemoglobin: 12 g/dL — ABNORMAL LOW (ref 13.0–17.0)
Hemoglobin: 12 g/dL — ABNORMAL LOW (ref 13.0–17.0)

## 2020-11-15 LAB — TSH: TSH: 2.119 u[IU]/mL (ref 0.350–4.500)

## 2020-11-15 LAB — HEPATITIS B CORE ANTIBODY, TOTAL: Hep B Core Total Ab: NONREACTIVE

## 2020-11-15 LAB — HEPATITIS B SURFACE ANTIGEN: Hepatitis B Surface Ag: NONREACTIVE

## 2020-11-15 MED ORDER — METOPROLOL TARTRATE 25 MG PO TABS
25.0000 mg | ORAL_TABLET | Freq: Two times a day (BID) | ORAL | Status: DC
  Administered 2020-11-15 – 2020-11-17 (×5): 25 mg via ORAL
  Filled 2020-11-15 (×5): qty 1

## 2020-11-15 MED ORDER — POTASSIUM CHLORIDE 20 MEQ PO PACK
40.0000 meq | PACK | Freq: Once | ORAL | Status: AC
  Administered 2020-11-15: 40 meq via ORAL
  Filled 2020-11-15: qty 2

## 2020-11-15 MED ORDER — MAGNESIUM SULFATE 2 GM/50ML IV SOLN
2.0000 g | Freq: Once | INTRAVENOUS | Status: AC
  Administered 2020-11-15: 2 g via INTRAVENOUS
  Filled 2020-11-15: qty 50

## 2020-11-15 NOTE — Progress Notes (Signed)
PROGRESS NOTE    Joshua [name]  SFK:[id number] DOB: 21-Jul-1955 DOA: 11/14/2020 PCP: Jearld Fenton, NP   Brief Narrative:  This 65 years old male with history of COPD, lung cancer, CAD s/p PTCA 2017 who has not been taking Plavix but is still on aspirin due to cost issues, GERD, duodenal ulcer, duodenitis in 2018, noncompliant with PPI presented to the to the GI clinic with complaints of dyspepsia, nausea, loose dark stools for 2 weeks associated with some abdominal discomfort.  He had a history of hemorrhoid for which he underwent surgery in 1990 and stated he was doing pretty good without hematochezia until recently.  He denies any hematemesis.  Patient does report heavy alcohol intake drinks 1 vodka every day. Patient is admitted for GI bleed,  GI is consulted recommended continue PPI twice daily,  Patient will eventually need EGD and colonoscopy.  Patient has developed new onset atrial fibrillation with RVR.  Heart rate is now controlled.  Cardiology is consulted recommended patient is not a candidate for anticoagulation given acute GI bleed and risk of recurrent falls.  Assessment & Plan:   Active Problems:   GIB (gastrointestinal bleeding)  Upper GI bleed: Patient presented with dark-colored stools. Patient does report prior history of duodenitis, duodenal ulcer and has been noncompliant with PPI. He continues to drink alcohol heavily.  Differentials include alcoholic gastritis versus recurrent ulcer disease. Continue pantoprazole 40 mg IV twice daily. Start clear liquid diet and advance as tolerated. Hemoglobin remained stable, continue to monitor H&H. GI consulted, patient will need EGD and colonoscopy.  CAD:  Patient has not been taking Plavix due to the financial barriers. Last PTCA 2018, hold aspirin due to GI bleed.  Denies any chest pain.  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  Continue Neurontin. Wife reported patient did better with the  Lyrica but could not afford it anymore. PT and OT evaluation.  Elevated liver enzymes: This could be due to EtOH abuse Continue to trend liver enzymes.  COPD:  Not in any acute exacerbation continue home inhalers.  Hypertension:  continue home medications.  Diabetes melitis: Continue regular insulin sliding scale, continue Neurontin.  ETOH abuse: Patient has been drinking alcohol heavily and he understand this could be contributing to neuropathy and GI bleed and it can worsen above problems.  Counseled to quit and he agrees.  Continue CIWA protocol.  New onset atrial fibrillation with RVR: Heart rate is now controlled, cardiology consulted. Patient has CHADS Vasc of 5, he is not a candidate for anticoagulation due to acute GI bleed.  And recurrent falls. Will start metoprolol 25 twice daily.   DVT prophylaxis: SCDs Code Status: Full code. Family Communication: No family at bed side. Disposition Plan:   Status is: Inpatient  Remains inpatient appropriate because:Inpatient level of care appropriate due to severity of illness  Dispo: The patient is from: Home              Anticipated d/c is to: Home              Patient currently is not medically stable to d/c.   Difficult to place patient No  Consultants:  GI Cardiology  Procedures:  Antimicrobials:  Anti-infectives (From admission, onward)    None        Subjective: Patient was seen and examined at bedside.  Overnight events noted.   Patient reports he noticed black stools while wiping after bowel movement. Heart rate is now controlled.  He denies any withdrawal symptoms.  Objective: Vitals:   11/15/20 0400 11/15/20 0816 11/15/20 1218 11/15/20 1539  BP: (!) 159/80     Pulse: 78     Resp: 20     Temp: 98.2 F (36.8 C) 98.2 F (36.8 C) 98.6 F (37 C) 98.2 F (36.8 C)  TempSrc: Oral Oral Oral   SpO2: 96%       Intake/Output Summary (Last 24 hours) at 11/15/2020 1557 Last data filed at 11/15/2020  1218 Gross per 24 hour  Intake 2155.22 ml  Output 850 ml  Net 1305.22 ml   There were no vitals filed for this visit.  Examination:  General exam: Appears comfortable, deconditioned, not in any acute distress. Respiratory system: Clear to auscultation. Respiratory effort normal. Cardiovascular system: S1-S2 heard, rhythm irregular, no murmur,. Gastrointestinal system: Abdomen is soft, nontender, nondistended, BS + Central nervous system: Alert and oriented x2. No focal neurological deficits. Extremities: No edema, no cyanosis, no clubbing. Skin: No rashes, lesions or ulcers Psychiatry: Judgement and insight appear normal. Mood & affect appropriate.     Data Reviewed: I have personally reviewed following labs and imaging studies  CBC: Recent Labs  Lab 11/14/20 1115 11/14/20 1347 11/14/20 1717 11/14/20 2019 11/15/20 0500 11/15/20 1143  WBC 5.9  --  5.5  --   --   --   NEUTROABS 3.6  --   --   --   --   --   HGB 14.0 12.6* 12.8* 12.0* 12.0* 12.0*  HCT 39.7 35.5* 35.2* 33.8* 33.3* 33.3*  MCV 104.7*  --  103.5*  --   --   --   PLT 102*  --  81*  --   --   --    Basic Metabolic Panel: Recent Labs  Lab 11/14/20 1115 11/14/20 1717 11/15/20 0500 11/15/20 1322  NA 141  --  140  --   K 3.5  --  3.3*  --   CL 102  --  102  --   CO2 24  --  28  --   GLUCOSE 109*  --  100*  --   BUN 10  --  13  --   CREATININE 0.73  --  0.73  --   CALCIUM 8.7*  --  8.3*  --   MG  --  0.8*  --  2.6*  PHOS  --  2.5  --   --    GFR: Estimated Creatinine Clearance: 93.3 mL/min (by C-G formula based on SCr of 0.73 mg/dL). Liver Function Tests: Recent Labs  Lab 11/14/20 1115  AST 221*  ALT 63*  ALKPHOS 169*  BILITOT 1.0  PROT 6.6  ALBUMIN 3.5   Recent Labs  Lab 11/14/20 1115  LIPASE 92*   No results for input(s): AMMONIA in the last 168 hours. Coagulation Profile: Recent Labs  Lab 11/14/20 1115  INR 0.8   Cardiac Enzymes: No results for input(s): CKTOTAL, CKMB,  CKMBINDEX, TROPONINI in the last 168 hours. BNP (last 3 results) No results for input(s): PROBNP in the last 8760 hours. HbA1C: No results for input(s): HGBA1C in the last 72 hours. CBG: Recent Labs  Lab 11/15/20 0814 11/15/20 1210  GLUCAP 98 153*   Lipid Profile: No results for input(s): CHOL, HDL, LDLCALC, TRIG, CHOLHDL, LDLDIRECT in the last 72 hours. Thyroid Function Tests: Recent Labs    11/15/20 1322  TSH 2.119   Anemia Panel: No results for input(s): VITAMINB12, FOLATE, FERRITIN, TIBC, IRON, RETICCTPCT in the last  72 hours. Sepsis Labs: No results for input(s): PROCALCITON, LATICACIDVEN in the last 168 hours.  Recent Results (from the past 240 hour(s))  Resp Panel by RT-PCR (Flu A&B, Covid) Nasopharyngeal Swab     Status: None   Collection Time: 11/14/20 11:30 AM   Specimen: Nasopharyngeal Swab; Nasopharyngeal(NP) swabs in vial transport medium  Result Value Ref Range Status   SARS Coronavirus 2 by RT PCR NEGATIVE NEGATIVE Final    Comment: (NOTE) SARS-CoV-2 target nucleic acids are NOT DETECTED.  The SARS-CoV-2 RNA is generally detectable in upper respiratory specimens during the acute phase of infection. The lowest concentration of SARS-CoV-2 viral copies this assay can detect is 138 copies/mL. A negative result does not preclude SARS-Cov-2 infection and should not be used as the sole basis for treatment or other patient management decisions. A negative result may occur with  improper specimen collection/handling, submission of specimen other than nasopharyngeal swab, presence of viral mutation(s) within the areas targeted by this assay, and inadequate number of viral copies(<138 copies/mL). A negative result must be combined with clinical observations, patient history, and epidemiological information. The expected result is Negative.  Fact Sheet for Patients:  EntrepreneurPulse.com.au  Fact Sheet for Healthcare Providers:   IncredibleEmployment.be  This test is no t yet approved or cleared by the [place] FDA and  has been authorized for detection and/or diagnosis of SARS-CoV-2 by FDA under an Emergency Use Authorization (EUA). This EUA will remain  in effect (meaning this test can be used) for the duration of the COVID-19 declaration under Section 564(b)(1) of the Act, 21 U.S.C.section 360bbb-3(b)(1), unless the authorization is terminated  or revoked sooner.       Influenza A by PCR NEGATIVE NEGATIVE Final   Influenza B by PCR NEGATIVE NEGATIVE Final    Comment: (NOTE) The Xpert Xpress SARS-CoV-2/FLU/RSV plus assay is intended as an aid in the diagnosis of influenza from Nasopharyngeal swab specimens and should not be used as a sole basis for treatment. Nasal washings and aspirates are unacceptable for Xpert Xpress SARS-CoV-2/FLU/RSV testing.  Fact Sheet for Patients: EntrepreneurPulse.com.au  Fact Sheet for Healthcare Providers: IncredibleEmployment.be  This test is not yet approved or cleared by the [place] FDA and has been authorized for detection and/or diagnosis of SARS-CoV-2 by FDA under an Emergency Use Authorization (EUA). This EUA will remain in effect (meaning this test can be used) for the duration of the COVID-19 declaration under Section 564(b)(1) of the Act, 21 U.S.C. section 360bbb-3(b)(1), unless the authorization is terminated or revoked.  Performed at [facility], [place] [address] [place], [place] [address]     Radiology Studies: DG Chest Port 1 View  Result Date: 11/14/2020 CLINICAL DATA:  Weakness EXAM: PORTABLE CHEST 1 VIEW COMPARISON:  CT chest dated Aug 06, 2019 FINDINGS: Cardiac and mediastinal contours are unchanged and within normal limits. Bandlike consolidation of the right upper lobe. Lungs are otherwise clear. No evidence pleural effusion or pneumothorax. IMPRESSION: Bandlike  consolidation of the right upper lobe, likely posttreatment changes. Lungs are otherwise clear. Electronically Signed   By: Yetta Glassman M.D.   On: 11/14/2020 12:13    Scheduled Meds:  atorvastatin  40 mg Oral Daily   fluticasone furoate-vilanterol  1 puff Inhalation Daily   And   umeclidinium bromide  1 puff Inhalation Daily   folic acid  1 mg Oral Daily   gabapentin  300 mg Oral TID   loratadine  10 mg Oral Daily   losartan  25  mg Oral Daily   metoprolol tartrate  25 mg Oral BID   multivitamin with minerals  1 tablet Oral Daily   nicotine  14 mg Transdermal Daily   pantoprazole (PROTONIX) IV  40 mg Intravenous Q12H   thiamine  100 mg Oral Daily   Or   thiamine  100 mg Intravenous Daily   traZODone  50 mg Oral QHS   Continuous Infusions:  lactated ringers 75 mL/hr at 11/15/20 0152     LOS: 0 days    Time spent: 35 mins    Shaunita Seney, MD [facility]   If 7PM-7AM, please contact night-coverage

## 2020-11-15 NOTE — Care Management Obs Status (Signed)
MEDICARE OBSERVATION STATUS NOTIFICATION   Patient Details  Name: Joshua [name] MRN: [id number] Date of Birth: [birth year]   Medicare Observation Status Notification Given:  Yes    MahabirJuliann Pulse, RN 11/15/2020, 10:27 AM

## 2020-11-15 NOTE — TOC Initial Note (Signed)
Transition of Care [facility]) - Initial/Assessment Note    Patient Details  Name: Joshua [name] MRN: [id number] Date of Birth: February 06, 1956  [facility]) CM/SW Contact:    Dessa Phi, RN Phone Number: 11/15/2020, 10:25 AM  Clinical Narrative:  Spoke to patient about d/c plans-home;declines resources for etoh-he states he can manage on own. Continue to monitor for d/c plans.                 Expected Discharge Plan: Home/Self Care Barriers to Discharge: Continued Medical Work up   Patient Goals and CMS Choice Patient states their goals for this hospitalization and ongoing recovery are:: go home CMS Medicare.gov Compare Post Acute Care list provided to:: Patient Choice offered to / list presented to : Patient  Expected Discharge Plan and Services Expected Discharge Plan: Home/Self Care   Discharge Planning Services: CM Consult   Living arrangements for the past 2 months: Single Family Home                                      Prior Living Arrangements/Services Living arrangements for the past 2 months: Single Family Home Lives with:: Spouse Patient language and need for interpreter reviewed:: Yes Do you feel safe going back to the place where you live?: Yes      Need for Family Participation in Patient Care: No (Comment) Care giver support system in place?: Yes (comment)   Criminal Activity/Legal Involvement Pertinent to Current Situation/Hospitalization: No - Comment as needed  Activities of Daily Living Home Assistive Devices/Equipment: Nebulizer, CBG Meter ADL Screening (condition at time of admission) Patient's cognitive ability adequate to safely complete daily activities?: Yes Is the patient deaf or have difficulty hearing?: No Does the patient have difficulty seeing, even when wearing glasses/contacts?: No Does the patient have difficulty concentrating, remembering, or making decisions?: No Patient able to express need for assistance with  ADLs?: Yes Does the patient have difficulty dressing or bathing?: No Independently performs ADLs?: Yes (appropriate for developmental age) Does the patient have difficulty walking or climbing stairs?: Yes (secondary to weakness and shortness of breath) Weakness of Legs: Both Weakness of Arms/Hands: None  Permission Sought/Granted Permission sought to share information with : Case Manager Permission granted to share information with : Yes, Verbal Permission Granted  Share Information with NAME: Case manager           Emotional Assessment Appearance:: Appears stated age Attitude/Demeanor/Rapport: Gracious Affect (typically observed): Accepting Orientation: : Oriented to Self, Oriented to  Time, Oriented to Place, Oriented to Situation Alcohol / Substance Use: Alcohol Use Psych Involvement: No (comment)  Admission diagnosis:  GIB (gastrointestinal bleeding) [K92.2] Gastrointestinal hemorrhage, unspecified gastrointestinal hemorrhage type [K92.2] Patient Active Problem List   Diagnosis Date Noted   GIB (gastrointestinal bleeding) 10/29/4816   Periumbilical abdominal pain 10/27/2019   Duodenal ulcer 10/27/2019   Acute gastric erosion 56/31/4970   Helicobacter pylori gastritis 10/27/2019   Elevated LFTs 10/27/2019   Generalized abdominal pain 10/13/2019   Acute pain of right shoulder 10/13/2019   Fall 10/13/2019   Malignant neoplasm of upper lobe of right lung ([place]) 03/02/2018   Arthritis 01/11/2018   HLD (hyperlipidemia) 01/11/2018   Gout 01/11/2018   PAD (peripheral artery disease) ([place]) 01/07/2018   CAD in native artery, occluded Lcx and rotational atherectomy to LAD with DES 05/17/15 05/18/2015   Type 2 diabetes mellitus with peripheral neuropathy ([place]) 05/08/2015  GERD (gastroesophageal reflux disease) 03/21/2015   Insomnia 09/08/2012   Essential hypertension 11/20/2011   Moderate COPD (chronic obstructive pulmonary disease) ([place]) 11/20/2011   Peripheral vascular disease  ([place]) 11/20/2011   PCP:  Jearld Fenton, NP Pharmacy:   [id number] - [place], [place]  [address] Phone: [phone] Fax: [phone]  [facility] - [address], [place] [address] Phone: [phone] Fax: [phone]     Social Determinants of Health (SDOH) Interventions    Readmission Risk Interventions No flowsheet data found.

## 2020-11-15 NOTE — Progress Notes (Signed)
Echocardiogram 2D Echocardiogram has been performed.  Oneal Deputy Lara Palinkas RDCS 11/15/2020, 1:38 PM

## 2020-11-15 NOTE — Progress Notes (Signed)
Inpatient Rehab Admissions Coordinator Note:   Per PT recommendation patient was screened for CIR candidacy by Michel Santee, PT. At this time, pt appears to be a potential candidate for CIR. I will place an order for rehab consult for full assessment, per our protocol.  Please contact me any with questions.Shann Medal, PT, DPT [phone] 11/15/20 3:52 PM

## 2020-11-15 NOTE — Plan of Care (Signed)
  Problem: Clinical Measurements: Goal: Ability to maintain clinical measurements within normal limits will improve Outcome: Progressing   Problem: Clinical Measurements: Goal: Cardiovascular complication will be avoided Outcome: Progressing   Problem: Elimination: Goal: Will not experience complications related to bowel motility Outcome: Progressing Goal: Will not experience complications related to urinary retention Outcome: Progressing   Problem: Safety: Goal: Ability to remain free from injury will improve Outcome: Progressing

## 2020-11-15 NOTE — Evaluation (Addendum)
Physical Therapy Evaluation Patient Details Name: Joshua [name] MRN: [id number] DOB: [birth year] Today's Date: 11/15/2020   History of Present Illness  Joshua [name] is a 65 y.o. male presents to GI clinic with c/o dyspepsia, nausea, loose dark stools x 2 weeks associated with some abdominal discomfort. PMH: neuropathy, COPD, lung ca, CAD s/p PTCA 2017, GERD, duodenal ulcer/duodenitis in 2018, diabetes, HTN, PAD, positive TB test, ACDF, posterior laminectomy/decompression lumbar spine, cadiac caths  Clinical Impression  Pt admitted with above diagnosis. Pt independent with all mobility until ~2 weeks ago, now using [facility] and with multiple falls at home. Pt currently requiring min-mod A with limited ambulation due to R knee buckling, RLE pain and BLE weakness. Pt with previous back surgeries and states he is active with neurology due to BLE weakness- unable to find note and pt unable to provide details. Recommending CIR for intensive rehab prior to return home with spouse working outside of the home ~4 hrs/day. Notified MD and RN regarding RLE pain and buckling with mobility; xray ordered per MD. Pt currently with functional limitations due to the deficits listed below (see PT Problem List). Pt will benefit from skilled PT to increase their independence and safety with mobility to allow discharge to the venue listed below.       Follow Up Recommendations CIR    Equipment Recommendations  Other (comment) (TBD)    Recommendations for Other Services       Precautions / Restrictions Precautions Precautions: Fall Precaution Comments: monitor BP Restrictions Weight Bearing Restrictions: No      Mobility  Bed Mobility Overal bed mobility: Needs Assistance Bed Mobility: Supine to Sit;Sit to Supine  Supine to sit: Supervision Sit to supine: Supervision   General bed mobility comments: light use of bedrail to upright self to sitting, supv for safety    Transfers Overall transfer  level: Needs assistance Equipment used: Rolling walker (2 wheeled);None Transfers: Sit to/from Stand Sit to Stand: Min assist    General transfer comment: min A to power to stand, initial rep without DME and pt with LOB posterior assisted to sitting at EOB, 2nd rise with RW and still min A to steady with rising  Ambulation/Gait Ambulation/Gait assistance: Min assist;Mod assist Gait Distance (Feet): 6 Feet Assistive device: Rolling walker (2 wheeled) Gait Pattern/deviations: Step-to pattern;Decreased stride length;Decreased stance time - right Gait velocity: decreased   General Gait Details: step to pattern with decreased R stance time, R knee buckling noted multiple times in short distance and pt unaware to prevent requiring mod A to prevent LOB, limited by RLE pain in hip/knee/toe  Stairs            Wheelchair Mobility    Modified Rankin (Stroke Patients Only)       Balance Overall balance assessment: Needs assistance;History of Falls Sitting-balance support: Feet supported Sitting balance-Leahy Scale: Good  Standing balance support: During functional activity;Bilateral upper extremity supported Standing balance-Leahy Scale: Poor Standing balance comment: UE support, occasional mod A       Pertinent Vitals/Pain Pain Assessment: Faces Faces Pain Scale: Hurts even more Pain Location: RLE Pain Descriptors / Indicators: Sore Pain Intervention(s): Limited activity within patient's tolerance;Monitored during session    Home Living Family/patient expects to be discharged to:: Private residence Living Arrangements: Spouse/significant other Available Help at Discharge: Family;Available PRN/intermittently Type of Home: House Home Access: Ramped entrance     Home Layout: One level Home Equipment: Cane - single point      Prior Function Level  of Independence: Independent  Comments: Pt independent, fishes on land or in boat, no DME- the past 2 weeks pt using SPC and 2  falls due to BLE weakness     Hand Dominance        Extremity/Trunk Assessment   Upper Extremity Assessment Upper Extremity Assessment: Overall WFL for tasks assessed    Lower Extremity Assessment Lower Extremity Assessment: Generalized weakness;RLE deficits/detail;LLE deficits/detail RLE Deficits / Details: ankle 3+/5, knee extension 3/5, hip flexion 3/5, hip abd 3+/5, hip add 3+/5, pain with all testing RLE Sensation: decreased light touch LLE Deficits / Details: ankle 3+/5, knee extension 3/5, hip flexion 3/5, hip abd 3+/5, hip add 3+/5 LLE Sensation: decreased light touch    Cervical / Trunk Assessment Cervical / Trunk Assessment: Normal  Communication   Communication: No difficulties  Cognition Arousal/Alertness: Awake/alert Behavior During Therapy: WFL for tasks assessed/performed Overall Cognitive Status: Within Functional Limits for tasks assessed       General Comments General comments (skin integrity, edema, etc.): BP 162/102 upon arrival, rechecked at 162/100 and pt denies dizziness, HA, blurred vision- RN notified    Exercises     Assessment/Plan    PT Assessment Patient needs continued PT services  PT Problem List Decreased strength;Decreased activity tolerance;Decreased balance;Decreased mobility;Decreased knowledge of use of DME;Impaired sensation;Pain;Cardiopulmonary status limiting activity       PT Treatment Interventions DME instruction;Gait training;Functional mobility training;Therapeutic activities;Therapeutic exercise;Balance training;Patient/family education    PT Goals (Current goals can be found in the Care Plan section)  Acute Rehab PT Goals Patient Stated Goal: "I want to walk like normal again" PT Goal Formulation: With patient/family Time For Goal Achievement: 11/29/20 Potential to Achieve Goals: Good    Frequency Min 3X/week   Barriers to discharge        Co-evaluation               AM-PAC PT "6 Clicks" Mobility   Outcome Measure Help needed turning from your back to your side while in a flat bed without using bedrails?: A Little Help needed moving from lying on your back to sitting on the side of a flat bed without using bedrails?: A Little Help needed moving to and from a bed to a chair (including a wheelchair)?: A Little Help needed standing up from a chair using your arms (e.g., wheelchair or bedside chair)?: A Little Help needed to walk in hospital room?: A Lot Help needed climbing 3-5 steps with a railing? : A Lot 6 Click Score: 16    End of Session Equipment Utilized During Treatment: Gait belt Activity Tolerance: Patient tolerated treatment well;Patient limited by pain Patient left: in bed;with call bell/phone within reach;with bed alarm set;with family/visitor present Nurse Communication: Mobility status;Other (comment) (RLE xray, BP) PT Visit Diagnosis: Unsteadiness on feet (R26.81);Other abnormalities of gait and mobility (R26.89);Repeated falls (R29.6);Muscle weakness (generalized) (M62.81)    Time: 4098-1191 PT Time Calculation (min) (ACUTE ONLY): 16 min   Charges:   PT Evaluation $PT Eval Moderate Complexity: 1 Mod           Tori Claudis Giovanelli PT, DPT 11/15/20, 3:20 PM

## 2020-11-15 NOTE — Progress Notes (Addendum)
Progress Note Hospital Day: 2  Chief Complaint:   black stool, rectal bleeding      ASSESSMENT AND PLAN   Brief History:  65 yo male with multiple medical problems such as COPD, hx of lung cancer, CAD s/p PTCA in 2017( he hasn't been on Plavix due to cost), H.pylori related gastroduodenitis in 2018 ( didn't complete treatment). Patient seen in our office yesterday for worsening GERD symptoms, abdominal pain , diarrhea, complaints of both dark stool and bright red blood per rectum. Clinically he didn't seem well and was sent to ED for further evaluation.   # Two weeks history of nausea / vomiting / epigastric pain / diarrea / black stools AND bright red blood per rectum. Probably upper GI bleed. Hx of h.pylori related gastroduodenitis / duodenal ulcer in 2018 ( couldn't afford antibiotics for H.pylori.) Rule out recurrent PUD. Esophageal varices, portal gastropathy not excluded --Continue BID IV PPI --Will need EGD. He should also under colonoscopy given rectal bleeding.He may need anticoagulation at some point given new diagnosis of Afib this admission. Additionally he may need to resume Plavix at some point--Clear liquids okay for now --Lipase mildly elevated ( < 2 x ULN) but can't exclude concurrent pancreatitis given Etoh use / abdominal pain   # Elevated liver chemistries, most likely Etoh related. CTAP in Aug 2021 suggested fatty liver. --Will check for chronic hep B, hep C  # New onset Afib, rate controlled on Cardizem  # Cholelithiasis, suspect asymptomatic at this point.   # Etoh abuse. Drinks a pint of liquor a day. Liver chemistries elevated in pattern of Etoh. INR normal. Platelets are low at 81 which could be secondary to bone marrow suppression relate to Etoh but chronic liver disease not excluded. No signs of Etoh withdrawal.  --Permanent discontinuation of Etoh  # CAD / PTCA in 2018. Not taking Plavix due to cost.   # COPD , stable. 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      DIAGNOSTIC STUDIES   Oct 2018 EGD -Normal esophagus status post empiric dilation with 54 French Maloney dilator 2. Erosive gastritis status post CLO test 3. Duodenal ulcer disease and duodenitis .   Labs: Hgb 14 in ED yesterday, down to 12 today. Platelets 81, Renal function is normal. AST 221 / ALT 63, alk phos 169 and Tbili 1.0. INR 0.8.    SUBJECTIVE     No complaints. No nausea / vomiting / upper abdominal pain today. He says those symptoms were mainly last week.    OBJECTIVE      Scheduled inpatient medications:   atorvastatin  40 mg Oral Daily   fluticasone furoate-vilanterol  1 puff Inhalation Daily   And   umeclidinium bromide  1 puff Inhalation Daily   folic acid  1 mg Oral Daily   gabapentin  300 mg Oral TID   loratadine  10 mg Oral Daily   losartan  25 mg Oral Daily   multivitamin with minerals  1 tablet Oral Daily   nicotine  14 mg Transdermal Daily   pantoprazole (PROTONIX) IV  40 mg Intravenous Q12H   potassium chloride  40 mEq Oral Once   thiamine  100 mg Oral Daily   Or   thiamine  100 mg Intravenous Daily   traZODone  50 mg Oral QHS   Continuous inpatient infusions:   lactated ringers 75 mL/hr at 11/15/20 0152  magnesium sulfate bolus IVPB     PRN inpatient medications: acetaminophen, albuterol, hydrALAZINE, LORazepam **OR** LORazepam, ondansetron **OR** ondansetron (ZOFRAN) IV, tiZANidine  Vital signs in last 24 hours: Temp:  [98.2 F (36.8 C)-99.6 F (37.6 C)] 98.2 F (36.8 C) (08/31 0816) Pulse Rate:  [78-167] 78 (08/31 0400) Resp:  [15-24] 20 (08/31 0400) BP: (90-159)/(58-87) 159/80 (08/31 0400) SpO2:  [93 %-98 %] 96 % (08/31 0400)    Intake/Output Summary (Last 24 hours) at 11/15/2020 1038 Last data filed at 11/15/2020 0600 Gross per 24 hour  Intake 1915.22 ml  Output 650 ml  Net 1265.22 ml     Physical Exam:  General: Alert male in  NAD Heart:  Regular rate . No lower extremity edema Pulmonary: Normal respiratory effort Abdomen: Soft, nondistended, nontender. Normal bowel sounds.  Neurologic: Alert and oriented Psych: Pleasant. Cooperative.   There were no vitals filed for this visit.  Intake/Output from previous day: 08/30 0701 - 08/31 0700 In: 1915.2 [I.V.:1227.8; IV Piggyback:687.4] Out: 650 [Urine:650] Intake/Output this shift: No intake/output data recorded.    Lab Results: Recent Labs    11/14/20 1115 11/14/20 1347 11/14/20 1717 11/14/20 2019 11/15/20 0500  WBC 5.9  --  5.5  --   --   HGB 14.0   < > 12.8* 12.0* 12.0*  HCT 39.7   < > 35.2* 33.8* 33.3*  PLT 102*  --  81*  --   --    < > = values in this interval not displayed.   BMET Recent Labs    11/14/20 1115 11/15/20 0500  NA 141 140  K 3.5 3.3*  CL 102 102  CO2 24 28  GLUCOSE 109* 100*  BUN 10 13  CREATININE 0.73 0.73  CALCIUM 8.7* 8.3*   LFT Recent Labs    11/14/20 1115  PROT 6.6  ALBUMIN 3.5  AST 221*  ALT 63*  ALKPHOS 169*  BILITOT 1.0   PT/INR Recent Labs    11/14/20 1115  LABPROT 11.0*  INR 0.8   Hepatitis Panel No results for input(s): HEPBSAG, HCVAB, HEPAIGM, HEPBIGM in the last 72 hours.  DG Chest Port 1 View  Result Date: 11/14/2020 CLINICAL DATA:  Weakness EXAM: PORTABLE CHEST 1 VIEW COMPARISON:  CT chest dated Aug 06, 2019 FINDINGS: Cardiac and mediastinal contours are unchanged and within normal limits. Bandlike consolidation of the right upper lobe. Lungs are otherwise clear. No evidence pleural effusion or pneumothorax. IMPRESSION: Bandlike consolidation of the right upper lobe, likely posttreatment changes. Lungs are otherwise clear. Electronically Signed   By: Yetta Glassman M.D.   On: 11/14/2020 12:13        Active Problems:   GIB (gastrointestinal bleeding)     LOS: 0 days   Tye Savoy ,NP 11/15/2020, 10:38 AM   I have discussed the case with the APP, and that is the plan I  formulated. I personally interviewed and examined the patient.  CC: Hematochezia Thrombocytopenia Acute blood loss anemia  Patient reported passage of black stool last week, but progressing to bright red blood with nearly every bowel movement since then.  Stool is also loose. Multiple medical issues including COPD, new onset A. fib, mild alcoholic hepatitis, coronary artery disease with prior PTCA (should be on Plavix but was not taking it due to cost), previous H. pylori for which patient did not take treatment due to cost. Possible peptic ulcer, which might explain reported black stool, but now bright red blood suggest lower GI source such as colitis,  hemorrhoidal, less likely neoplastic or diverticular.  (No diverticuli noted on August 2021 CT abdomen pelvis)  Thrombocytopenia probably multifactorial, decreased marrow production from alcohol abuse, consumptive from bleeding, unclear if he truly has cirrhosis (no appearance of that on CT scan a year ago), and INR normal.  Plan is for EGD and colonoscopy the day after tomorrow (11/17/2020).  Endoscopy scheduled is full tomorrow and patient hemodynamically stable. Hemoglobin has been stable on multiple checks, so I have discontinued the every 8 hour check.  CBC tomorrow morning.  He was agreeable to upper endoscopy and colonoscopy after discussion of procedure and risks.  The benefits and risks of the planned procedure were described in detail with the patient or (when appropriate) their health care proxy.  Risks were outlined as including, but not limited to, bleeding, infection, perforation, adverse medication reaction leading to cardiac or pulmonary decompensation, pancreatitis (if ERCP).  The limitation of incomplete mucosal visualization was also discussed.  No guarantees or warranties were given. Patient at increased risk for cardiopulmonary complications of procedure due to medical comorbidities.  Patient said he is not, not being here this  long, and states he needs to be out of here Friday.  I recommended he stay until his work-up is complete and we ensure his stability.  Total time 35 minutes, extensive chart review required and over half that time spent in face-to-face evaluation with patient.  Nelida Meuse III Office: [phone]

## 2020-11-15 NOTE — Consult Note (Addendum)
Cardiology Consultation:   Patient ID: Joshua [name] MRN: [id number]; DOB: 03/16/1956  Admit date: 11/14/2020 Date of Consult: 11/15/2020  PCP:  Jearld Fenton, NP   [facility] Providers Cardiologist:  Previously seen by Dr. Marlou Porch and Dr. Fletcher [name]. Last seen in 01/2018.  Patient Profile:   Joshua [name] is a 65 y.o. male with a history of CAD s/p rotational atherectomy and stenting with DES x2 to tandem proximal and mid LAD lesions in 05/2015, PAD s/p aortobifemoral bypass in 2010 at [place] and then orbital atherectomy and drug-coated balloon angioplasty to the ostial/proximal right SFA in 12/2017, mild bilateral carotid stenosis (1-39%) on dopplers in 11/2017, COPD, hypertension, hyperlipidemia, type 2 diabetes with diabetic neuropathy, GERD, and polysubstance abuse (tobacco and alcohol), who is being seen for the evaluation of new onset atrial fibrillation at the request of Dr. Dwyane Dee.  History of Present Illness:   Joshua [name] is a 65 year old male with the above history who is followed by Dr. Marlou Porch and Dr. Fletcher [name]. However, patient has not been seen by our office since 01/2018. Patient has not had any repeat ischemic evaluation since time of PCI in 2017.  Patient states he has not returned for office visits because he cannot afford.  He has been off Plavix for a while now but states he has been taking his blood pressure and cholesterol medications as a baby Aspirin.  Of note, patient states he has an IVC filter which was placed at ICU.  He states this was due to a clot in his back not a DVT.  CT scan back in 10/2019 did confirm IVC filter was in place.  Patient presented to the [facility] ED on 11/14/2020 for further evaluation of black diarrhea, nausea, and lower abdominal pain x2 weeks. He has a known history of duodenitis and duodenal ulcer and had been noncompliant with PPI. Also continued to drink alcohol heavily. Hemoglobin was 14.0, down from 17 range  in 06/2019. Hemoccult  pending. GI consulted and plan is for EGD/colonoscopy. He went into atrial fibrillation with RVR last night so Cardiology was consulted.  Telemetry was reviewed and showed about a 4-hour episode of atrial fibrillation last night with rates ranging between the 140s to 180s.  Currently in sinus rhythm with rates in the 90s.  At the time of this evaluation, patient is resting comfortably in no acute distress.  He is currently in sinus rhythm.  He states he was completely unaware when he went into atrial fibrillation with RVR last night.  No palpitations, lightheadedness, dizziness.  No syncope. He denies any chest pain.  He has chronic dyspnea on exertion due to his COPD but this is stable.  No shortness of breath at rest.  No orthopnea or PND.  He notes occasional right ankle swelling.  He denies any recent fevers or illnesses he has h had diarrhea, nausea, abdominal pain for 2 weeks.  He initially had bright red blood in his diarrhea but then it turned black.  No other abnormal bleeding -no hematuria, hemoptysis, hematemesis.  Does report right leg pain and weakness.  He states his low leg just gives out on him.  He has had multiple falls recently and has hit his head during these falls.  He has a black eye on the right.  Patient has a 50-year smoking history and currently smokes half pack per day.  He also endorses alcohol abuse and states he drinks 1 pint of Vodka every day.  He states  he has been drinking this heavily for about a year.  No recreational drug use.  He states he will try to cut back significantly on his alcohol use but his wife is very skeptical of this.  Past Medical History:  Diagnosis Date   AC (acromioclavicular) joint bone spurs, unspecified laterality 11/25/2016   Arthritis    "back" (05/16/2015)   CAD in native artery, occluded Lcx and rotational atherectomy to LAD with DES 05/17/15 05/18/2015   Cavitary lesion of lung 02/25/2018   Chronic back pain    "all my back"   Chronic  bronchitis (HCC)    COPD (chronic obstructive pulmonary disease) ([place])    Diabetes mellitus without complication (HCC)    GERD (gastroesophageal reflux disease)    H/O blood clots    "had them in my back; put filter in before one of my neck ORs"   History of gout    Hyperlipidemia    Hypertension    Neuromuscular disorder ([place])    PAD (peripheral artery disease) (HCC)    Phlebitis    Positive TB test    Seasonal allergies    Shortness of breath    Spinal disease     Past Surgical History:  Procedure Laterality Date   ABDOMINAL AORTOGRAM W/LOWER EXTREMITY  01/07/2018   ABDOMINAL AORTOGRAM W/LOWER EXTREMITY N/A 01/07/2018   Procedure: ABDOMINAL AORTOGRAM W/LOWER EXTREMITY;  Surgeon: Wellington Hampshire, MD;  Location: [place] CV LAB;  Service: Cardiovascular;  Laterality: N/A;   ANTERIOR CERVICAL DECOMP/DISCECTOMY FUSION  ~ 2001-2009 X 3   ANTERIOR CERVICAL DECOMP/DISCECTOMY FUSION N/A 10/06/2012   Procedure: ANTERIOR CERVICAL DECOMPRESSION/DISCECTOMY FUSION 1 LEVEL;  Surgeon: [name], MD;  Location: [place] [facility];  Service: Neurosurgery;  Laterality: N/A;  C7T1 anterior cervical decompression with fusion plating and bonegraft    BACK SURGERY     CARDIAC CATHETERIZATION  05/16/2015   CARDIAC CATHETERIZATION N/A 05/16/2015   Procedure: Left Heart Cath and Coronary Angiography;  Surgeon: [name], MD;  Location: [place] CV LAB;  Service: Cardiovascular;  Laterality: N/A;   CARDIAC CATHETERIZATION N/A 05/16/2015   Procedure: Coronary Balloon Angioplasty;  Surgeon: [name], MD;  Location: [place] CV LAB;  Service: Cardiovascular;  Laterality: N/A;   CARDIAC CATHETERIZATION N/A 05/17/2015   Procedure: Coronary Stent Intervention Rotoblater;  Surgeon: [name], MD;  Location: [place] CV LAB;  Service: Cardiovascular;  Laterality: N/A;   ELBOW SURGERY Left 2016   "had bone pieces removed"   ESOPHAGEAL MANOMETRY N/A 12/30/2016   Procedure: ESOPHAGEAL  MANOMETRY (EM);  Surgeon: Irene Shipper, MD;  Location: [facility];  Service: Endoscopy;  Laterality: N/A;   EXCISIONAL HEMORRHOIDECTOMY     FEMORAL BYPASS  ~ 2010   IR RADIOLOGIST EVAL & MGMT  12/17/2016   IVC FILTER PLACEMENT ([place] [facility])  2010   "had blood clots in my back"   NECK SURGERY  2014   2008-2014   PERIPHERAL VASCULAR ATHERECTOMY  01/07/2018   Procedure: PERIPHERAL VASCULAR ATHERECTOMY;  Surgeon: Wellington Hampshire, MD;  Location: [place] CV LAB;  Service: Cardiovascular;;   PERIPHERAL VASCULAR BALLOON ANGIOPLASTY  01/07/2018   Procedure: PERIPHERAL VASCULAR BALLOON ANGIOPLASTY;  Surgeon: Wellington Hampshire, MD;  Location: [place] CV LAB;  Service: Cardiovascular;;   PERIPHERAL VASCULAR CATHETERIZATION N/A 02/14/2016   Procedure: Abdominal Aortogram w/Lower Extremity;  Surgeon: Wellington Hampshire, MD;  Location: [place] CV LAB;  Service: Cardiovascular;  Laterality: N/A;   POSTERIOR LAMINECTOMY /  DECOMPRESSION LUMBAR SPINE  2006   "bone spurs"   VIDEO BRONCHOSCOPY WITH ENDOBRONCHIAL NAVIGATION Right 02/25/2018   Procedure: VIDEO BRONCHOSCOPY WITH ENDOBRONCHIAL NAVIGATION;  Surgeon: Garner Nash, DO;  Location: [place];  Service: Thoracic;  Laterality: Right;   VIDEO BRONCHOSCOPY WITH ENDOBRONCHIAL ULTRASOUND Right 02/25/2018   Procedure: VIDEO BRONCHOSCOPY WITH ENDOBRONCHIAL ULTRASOUND;  Surgeon: Garner Nash, DO;  Location: [facility];  Service: Thoracic;  Laterality: Right;     Home Medications:  Prior to Admission medications   Medication Sig Start Date End Date Taking? Authorizing Provider  acetaminophen (TYLENOL) 500 MG tablet Take 1,000 mg by mouth every 6 (six) hours as needed for mild pain.   Yes [provider]  albuterol (PROAIR HFA) 108 (90 Base) MCG/ACT inhaler USE 2 PUFFS EVERY 6 HOURS  AS NEEDED FOR WHEEZING OR  SHORTNESS OF BREATH Patient taking differently: Inhale 2 puffs into the lungs every 6 (six) hours as needed for wheezing or shortness of  breath. 01/19/20  Yes Icard, Bradley L, DO  albuterol (PROVENTIL) (2.5 MG/3ML) 0.083% nebulizer solution Take 3 mLs (2.5 mg total) by nebulization every 6 (six) hours as needed for wheezing or shortness of breath. 09/16/18  Yes Icard, Octavio Graves, DO  aspirin EC 81 MG tablet Take 81 mg by mouth daily.   Yes [provider]  colchicine 0.6 MG tablet TAKE 2 TABLETS BY MOUTH AS  DIRECTED THEN 1 TABLET  AFTER 1 HOUR. CAN TAKE 1  TABLET TWICE DAILY  THEREAFTER. Patient taking differently: Take 0.6 mg by mouth 2 (two) times daily as needed (gout flare). 11/28/19  Yes Jearld Fenton, NP  fexofenadine (ALLEGRA ALLERGY) 180 MG tablet Take 1 tablet (180 mg total) by mouth daily. 12/19/17  Yes Baity, Coralie Keens, NP  Fluticasone-Umeclidin-Vilant (TRELEGY ELLIPTA) 100-62.5-25 MCG/INH AEPB Inhale 1 puff into the lungs daily. 01/19/20  Yes Icard, Bradley L, DO  gabapentin (NEURONTIN) 300 MG capsule Take 1 capsule (300 mg total) by mouth 3 (three) times daily. 05/11/20  Yes Copland, Frederico Hamman, MD  losartan (COZAAR) 25 MG tablet TAKE 1 TABLET BY MOUTH  DAILY Patient taking differently: Take 25 mg by mouth daily. 07/14/20  Yes Dutch Quint B, FNP  methocarbamol (ROBAXIN) 500 MG tablet Take 500 mg by mouth every 6 (six) hours as needed for muscle spasms. 11/02/20  Yes [provider]  OVER THE COUNTER MEDICATION Apply 1 application topically daily as needed (neuropathy pain). Hemp oil cream   Yes [provider]  traZODone (DESYREL) 100 MG tablet TAKE 1 TABLET BY MOUTH AT  BEDTIME Patient taking differently: Take 100 mg by mouth at bedtime as needed for sleep. 09/24/19  Yes Jearld Fenton, NP  atorvastatin (LIPITOR) 40 MG tablet Take 1 tablet (40 mg total) by mouth daily. 11/14/20   Wellington Hampshire, MD  Blood Glucose Monitoring Suppl (ONETOUCH VERIO) w/Device KIT 1 Device by Does not apply route once. 05/19/15   Jearld Fenton, NP  clopidogrel (PLAVIX) 75 MG tablet Take 1 tablet (75 mg total) by mouth  daily. 11/14/20   Wellington Hampshire, MD  omeprazole (PRILOSEC) 40 MG capsule TAKE 1 CAPSULE BY MOUTH  DAILY Patient not taking: Reported on 11/14/2020 11/14/20   Zehr, Laban Emperor, PA-C  ONE TOUCH LANCETS MISC 1 each by Does not apply route 3 (three) times daily. 05/19/15   Jearld Fenton, NP  [facility] ULTRA test strip USE TO TEST 3 TIMES DAILY 07/14/20   Kennyth Arnold, FNP  Inpatient Medications: Scheduled Meds:  atorvastatin  40 mg Oral Daily   fluticasone furoate-vilanterol  1 puff Inhalation Daily   And   umeclidinium bromide  1 puff Inhalation Daily   folic acid  1 mg Oral Daily   gabapentin  300 mg Oral TID   loratadine  10 mg Oral Daily   losartan  25 mg Oral Daily   multivitamin with minerals  1 tablet Oral Daily   nicotine  14 mg Transdermal Daily   pantoprazole (PROTONIX) IV  40 mg Intravenous Q12H   potassium chloride  40 mEq Oral Once   thiamine  100 mg Oral Daily   Or   thiamine  100 mg Intravenous Daily   traZODone  50 mg Oral QHS   Continuous Infusions:  lactated ringers 75 mL/hr at 11/15/20 0152   magnesium sulfate bolus IVPB     PRN Meds: acetaminophen, albuterol, hydrALAZINE, LORazepam **OR** LORazepam, ondansetron **OR** ondansetron (ZOFRAN) IV, tiZANidine  Allergies:    Allergies  Allergen Reactions   Lisinopril Hives and Itching   Amitriptyline Other (See Comments)    Makes pt feel "weird" and nausea.   Contrast Media [Iodinated Diagnostic Agents] Hives and Itching    Pt can use if taking benadryl    Ibuprofen Other (See Comments)    Internal bleeding    Social History:   Social History   Socioeconomic History   Marital status: Married    Spouse name: Not on file   Number of children: Not on file   Years of education: 7   Highest education level: Not on file  Occupational History   Occupation: Disabled    Employer: DISABLED  Tobacco Use   Smoking status: Every Day    Packs/day: 0.50    Years: 49.00    Pack years: 24.50    Types:  Cigarettes   Smokeless tobacco: Never   Tobacco comments:    half a pack a day   Vaping Use   Vaping Use: Never used  Substance and Sexual Activity   Alcohol use: Yes    Alcohol/week: 0.0 standard drinks    Comment: 2 drinks a week   Drug use: No   Sexual activity: Not Currently  Other Topics Concern   Not on file  Social History Narrative   Regular exercise-no   Caffeine Use-yes   Lives with wife in a one story home. Has 2 children.  On disability for low back pain.  Used to work as a Games developer, lasted worked in 2007.     Education: 7th grade.   Social Determinants of Health   Financial Resource Strain: Not on file  Food Insecurity: Not on file  Transportation Needs: Not on file  Physical Activity: Not on file  Stress: Not on file  Social Connections: Not on file  Intimate Partner Violence: Not on file    Family History:   Family History  Problem Relation Age of Onset   Stomach cancer Mother        Deceased, 7s   Hypertension Mother    Hypertension Father    Stroke Father    Heart attack Father        Deceased, 33   Aneurysm Brother    Healthy Daughter    Healthy Maternal Grandmother    Leukemia Grandchild    Diabetes Neg Hx    Early death Neg Hx    Colon cancer Neg Hx    Pancreatic cancer Neg Hx      ROS:  Please see the history of present illness.  Review of Systems  Constitutional:  Negative for chills and fever.  HENT:  Negative for nosebleeds.   Respiratory:  Positive for cough and shortness of breath. Negative for hemoptysis and sputum production.   Cardiovascular:  Negative for chest pain, palpitations, orthopnea and PND. Leg swelling: occasional right ankel swelling. Gastrointestinal:  Positive for abdominal pain, blood in stool, diarrhea, melena and nausea.  Genitourinary:  Negative for hematuria.  Musculoskeletal:  Positive for falls. Negative for myalgias.  Neurological:  Positive for weakness (right leg weakness). Negative for dizziness and  loss of consciousness.  Endo/Heme/Allergies:  Does not bruise/bleed easily.  Psychiatric/Behavioral:  Positive for substance abuse (alcohol and tobacco use).    All other ROS reviewed and negative.     Physical Exam/Data:   Vitals:   11/15/20 0300 11/15/20 0400 11/15/20 0816 11/15/20 1218  BP: (!) 159/84 (!) 159/80    Pulse: 79 78    Resp: (!) 21 20    Temp:  98.2 F (36.8 C) 98.2 F (36.8 C) 98.6 F (37 C)  TempSrc:  Oral Oral Oral  SpO2: 95% 96%      Intake/Output Summary (Last 24 hours) at 11/15/2020 1223 Last data filed at 11/15/2020 1218 Gross per 24 hour  Intake 2155.22 ml  Output 850 ml  Net 1305.22 ml   Last 3 Weights 11/14/2020 06/20/2020 05/11/2020  Weight (lbs) 176 lb 188 lb 185 lb 8 oz  Weight (kg) 79.833 kg 85.276 kg 84.142 kg     There is no height or weight on file to calculate BMI.  General: 65 y.o. Caucasian male resting comfortably in no acute distress. HEENT: Normocephalic and atraumatic. Sclera clear.  Neck: Supple. No JVD. Heart: RRR. Distinct S1 and S2. No murmurs, gallops, or rubs. Radial pulses 2+ and equal bilaterally. Lungs: No increased work of breathing. Diffuse course breath sounds with wheezing/rhonchi noted. No crackles appreciated. Abdomen: Soft, non-distended, and non-tender to palpation. Bowel sounds present. Extremities: No lower extremity edema.    Skin: Warm and dry. Neuro: Alert and oriented x3. No focal deficits. Psych: Normal affect. Responds appropriately.  EKG:  The EKG was personally reviewed and demonstrates: Normal sinus rhythm, rate 87 bpm, with low voltage QRS but no acute ST/T changes. Telemetry:  Telemetry was personally reviewed and demonstrates:  Currently in normal sinus rhythm with rates in the 90s. Had about a 4 hours episode of atrial fibrillation with rates in the 140s to 180s last night.  Relevant CV Studies:  Left Cardiac Catheterization 05/16/2015: Prox LAD to Mid LAD lesion, 90% stenosed. Post intervention, there  remains 80% residual calcific stenosis. Mid LAD lesion, 80% stenosed. Dist LAD lesion, 55% stenosed. Prox Cx lesion, 100% stenosed. Prox Cx to Mid Cx lesion, 99% stenosed. Ost 2nd Mrg to 2nd Mrg lesion, 99% stenosed. -- Seen via retrograde filling from LAD/diagonal-OM collaterals There is hyperdynamic left ventricular systolic function. Elevated LVEDP     Severe 2 site lesion in the LAD. Very rigid lesions that would not yield to Cutting Balloon or noncompliant balloon angioplasty. These lesions are likely best treated with rotational atherectomy after review with Dr. Peter Martinique.   Plan: Monitor overnight following PTCA. We'll plan for return to the Cath Lab tomorrow for rotational atherectomy of the LAD with stenting of the 2 lesions. Advil for Dr. Peter Martinique. Continue Brilinta and aspirin. Will restart IV heparin 8 hours after TR band removal Add bisoprolol for beta blocker.  He will also need  statin added     Expect they'll be able to be discharged following day after rotational atherectomy/PCI.  _______________  Coronary Stent Intervention 05/17/2015: Mid LAD lesion, 80% stenosed. Post intervention, there is a 0% residual stenosis. Prox LAD to Mid LAD lesion, 80% stenosed. Post intervention, there is a 0% residual stenosis. The lesion was previously treated with angioplasty.   Successful rotational atherectomy and stenting of tandem lesions in the mid LAD with DES.   Plan: DAPT for one year. Anticipate DC in am. _______________  Lower Extremity Ultrasound/ABI 01/21/2018: Summary:  Right: 50-74% stenosis noted in the distal superficial femoral artery.  30-49% stenosis noted in the mid superficial femoral artery. The ostial  and proximal superficial femoral artery are patent with normal velocities  s/p atherectomy. Elevated velocities  noted in the right deep femoral artery.   ABI Summary: Right: Resting right ankle-brachial index is within normal range. No  evidence of  significant right lower extremity arterial disease. The right  toe-brachial index is normal.   Left: Resting left ankle-brachial index is within normal range. No  evidence of significant left lower extremity arterial disease. The left  toe-brachial index is normal.    Laboratory Data:  High Sensitivity Troponin:  No results for input(s): TROPONINIHS in the last 720 hours.   Chemistry Recent Labs  Lab 11/14/20 1115 11/15/20 0500  NA 141 140  K 3.5 3.3*  CL 102 102  CO2 24 28  GLUCOSE 109* 100*  BUN 10 13  CREATININE 0.73 0.73  CALCIUM 8.7* 8.3*  GFRNONAA >60 >60  ANIONGAP 15 10    Recent Labs  Lab 11/14/20 1115  PROT 6.6  ALBUMIN 3.5  AST 221*  ALT 63*  ALKPHOS 169*  BILITOT 1.0   Hematology Recent Labs  Lab 11/14/20 1115 11/14/20 1347 11/14/20 1717 11/14/20 2019 11/15/20 0500  WBC 5.9  --  5.5  --   --   RBC 3.79*  --  3.40*  --   --   HGB 14.0   < > 12.8* 12.0* 12.0*  HCT 39.7   < > 35.2* 33.8* 33.3*  MCV 104.7*  --  103.5*  --   --   MCH 36.9*  --  37.6*  --   --   MCHC 35.3  --  36.4*  --   --   RDW 13.0  --  12.9  --   --   PLT 102*  --  81*  --   --    < > = values in this interval not displayed.   BNPNo results for input(s): BNP, PROBNP in the last 168 hours.  DDimer No results for input(s): DDIMER in the last 168 hours.   Radiology/Studies:  DG Chest Port 1 View  Result Date: 11/14/2020 CLINICAL DATA:  Weakness EXAM: PORTABLE CHEST 1 VIEW COMPARISON:  CT chest dated Aug 06, 2019 FINDINGS: Cardiac and mediastinal contours are unchanged and within normal limits. Bandlike consolidation of the right upper lobe. Lungs are otherwise clear. No evidence pleural effusion or pneumothorax. IMPRESSION: Bandlike consolidation of the right upper lobe, likely posttreatment changes. Lungs are otherwise clear. Electronically Signed   By: Yetta Glassman M.D.   On: 11/14/2020 12:13     Assessment and Plan:   New Onset Atrial Fibrillation - Patient admitted  with upper GI bleed after presenting with black diarrhea, nausea, abdominal pain. Went into new onset atrial fibrillation last night with rates in the 140s to 180s. She was given 1 dose of  IV Cardizem 65m and converted back to sinus rhythm.  - Maintaining sinus rhythm at this time. - Potassium 3.3. Being supplemented by primary team. - Magnesium profoundly low at 0.8 yesterday. Being supplement by primary team. Will recheck this morning. - Will check TSH.  - Will order Echo. - Will wait for Echo results and continue to watch rates on telemetry before deciding on long term calcium channel blocker or beta-blocker. - CHA2DS2-VASc = 5 (CAD/PAD, HTN, DM, VTE s/p IVC filter). Will hold on anticoagulation for now given acute GI bleed until GI can complete work-up. Patient also has a history of alcohol abuse with multiple falls recently so he may not be a good long-term anticoagulation candidate. Had long discussion about stroke risk vs bleed risk. Wife is not convinced that patient will quit drinking.  CAD - S/p DES x2 to LAD in 2017.  - No angina. - Previously on Plavix but ran out of this several years ago. - Aspirin being held due to acute GI bleed. - Continue statin.  PAD - [place] and drug-coated balloon angioplasty to the ostial/proximal right SFA in 12/2017. - He does describe pain of right upper extremity (near hip) and significant weakness causing falls but does not describe claudication. - Aspirin on hold due to acute GI bleed. - Continue statin.  Hypertension - BP labile over the last 24 hours with systolic BP ranging from the 90s to 150s (although looks like systolic in the 944Yoccurred after dose of IV Diltiazem). Systolic BP currently in the 150s but has not received morning medications yet. - Continue Losartan 242mdaily.  Hyperlipidemia  - Continue home Lipitor 4034maily. - Will check fasting lipid panel in the  morning.   Type 2 Diabetes Mellitus - Management per primary team.  Upper GI Bleed History of Duodenitis/Duodenal Ulcer - Hemoglobin dropping: 12.8 yesterday, down from 14.0 on admission and 17.5 in 06/2019. - Hemoccult pending.  - GI consulted and sounds like they are planning EGD/colonscopy.  Alcohol Abuse - Patient drinks 1 pint of Vodka per day and states he has been doing this for the past year. No history of alcohol withdrawal in the past.  - On CIWA protocol. Currently on Thiamine and Folic acid. - Will need to continue to discuss importance of cessation.  Tobacco Abuse - Patient has a 50 year smoking history and currently smokes 1/2 pack per day. - Discussed importance of complete cessation.  Otherwise, per primary team: - COPD, - Elevated LFTs: previously felt to be due to alcohol use/fatty lver  - Arthritis    Risk Assessment/Risk Scores:    CHA2DS2-VASc Score = 5  This indicates a 7.2% annual risk of stroke. 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  Agree with above documentation.  Mr. ChaRicciuti a 64 48ar old male with a history of CAD status post DES x2 to LAD in 2017, PAD status post aortobifemoral bypass in 2010 and subsequent angioplasty to the right SFA in 2019, COPD, hypertension, T2DM, tobacco use, alcohol abuse who we are consulted to see by Dr. KumDwyane Deer evaluation  of atrial fibrillation.  He previously followed with Dr. Marlou Porch, but has not been seen since 2019.  He has been off Plavix but continues to take baby aspirin.  He reports he continues to drink 1 pint of vodka daily.  Presented to ED with  black-colored stools and abdominal pain.  GI consulted, planning EGD/colonoscopy.  Yesterday evening went into atrial fibrillation with RVR, prompting cardiology consult.  Was in A. fib x4 hours, with rates 140s to 180s.  Subsequently converted to sinus rhythm with rates 90s.  BP 159/80.  Labs notable for potassium 3.5, creatinine 0.73, AST 221, ALT 63, lipase 92, WBC 5.9, platelets 102, magnesium 0.8.  On exam, patient is alert and oriented, regular rate and rhythm, no murmurs, lungs CTAB, no LE edema.  For his atrial fibrillation, his CHA2DS2-VASc score is 5 but he is not a candidate for anticoagulation currently given acute GI bleed.  In addition he has had frequent falls.  Would hold off on anticoagulation for now.  Will start metoprolol 25 mg BID.  Echocardiogram ordered.  Correct hypomagnesemia to goal magnesium greater than 2 and hypokalemia to goal potassium greater than 4.  Donato Heinz, MD

## 2020-11-16 DIAGNOSIS — I480 Paroxysmal atrial fibrillation: Principal | ICD-10-CM

## 2020-11-16 DIAGNOSIS — I2510 Atherosclerotic heart disease of native coronary artery without angina pectoris: Secondary | ICD-10-CM | POA: Diagnosis not present

## 2020-11-16 DIAGNOSIS — K921 Melena: Secondary | ICD-10-CM | POA: Diagnosis not present

## 2020-11-16 DIAGNOSIS — K922 Gastrointestinal hemorrhage, unspecified: Secondary | ICD-10-CM | POA: Diagnosis not present

## 2020-11-16 LAB — CBC
HCT: 39.3 % (ref 39.0–52.0)
Hemoglobin: 13.8 g/dL (ref 13.0–17.0)
MCH: 37.1 pg — ABNORMAL HIGH (ref 26.0–34.0)
MCHC: 35.1 g/dL (ref 30.0–36.0)
MCV: 105.6 fL — ABNORMAL HIGH (ref 80.0–100.0)
Platelets: 71 10*3/uL — ABNORMAL LOW (ref 150–400)
RBC: 3.72 MIL/uL — ABNORMAL LOW (ref 4.22–5.81)
RDW: 12.7 % (ref 11.5–15.5)
WBC: 6 10*3/uL (ref 4.0–10.5)
nRBC: 0 % (ref 0.0–0.2)

## 2020-11-16 LAB — GLUCOSE, CAPILLARY
Glucose-Capillary: 97 mg/dL (ref 70–99)
Glucose-Capillary: 98 mg/dL (ref 70–99)
Glucose-Capillary: 88 mg/dL (ref 70–99)

## 2020-11-16 LAB — COMPREHENSIVE METABOLIC PANEL
ALT: 53 U/L — ABNORMAL HIGH (ref 0–44)
AST: 95 U/L — ABNORMAL HIGH (ref 15–41)
Albumin: 3.5 g/dL (ref 3.5–5.0)
Alkaline Phosphatase: 158 U/L — ABNORMAL HIGH (ref 38–126)
Anion gap: 11 (ref 5–15)
BUN: 7 mg/dL — ABNORMAL LOW (ref 8–23)
CO2: 26 mmol/L (ref 22–32)
Calcium: 8.9 mg/dL (ref 8.9–10.3)
Chloride: 103 mmol/L (ref 98–111)
Creatinine, Ser: 0.66 mg/dL (ref 0.61–1.24)
GFR, Estimated: >60 mL/min (ref >60)
Glucose, Bld: 103 mg/dL — ABNORMAL HIGH (ref 70–99)
Potassium: 3.3 mmol/L — ABNORMAL LOW (ref 3.5–5.1)
Sodium: 140 mmol/L (ref 135–145)
Total Bilirubin: 1.2 mg/dL (ref 0.3–1.2)
Total Protein: 6.9 g/dL (ref 6.5–8.1)

## 2020-11-16 LAB — MAGNESIUM: Magnesium: 1.6 mg/dL — ABNORMAL LOW (ref 1.7–2.4)

## 2020-11-16 LAB — PHOSPHORUS: Phosphorus: 2.4 mg/dL — ABNORMAL LOW (ref 2.5–4.6)

## 2020-11-16 MED ORDER — POTASSIUM CHLORIDE 20 MEQ PO PACK
40.0000 meq | PACK | Freq: Once | ORAL | Status: AC
  Administered 2020-11-16: 40 meq via ORAL
  Filled 2020-11-16: qty 2

## 2020-11-16 MED ORDER — MAGNESIUM SULFATE 2 GM/50ML IV SOLN
2.0000 g | Freq: Once | INTRAVENOUS | Status: AC
  Administered 2020-11-16: 2 g via INTRAVENOUS
  Filled 2020-11-16: qty 50

## 2020-11-16 MED ORDER — HYDRALAZINE HCL 20 MG/ML IJ SOLN
10.0000 mg | Freq: Four times a day (QID) | INTRAMUSCULAR | Status: DC | PRN
  Administered 2020-11-17 – 2020-11-21 (×2): 10 mg via INTRAVENOUS
  Filled 2020-11-16 (×3): qty 1

## 2020-11-16 NOTE — Progress Notes (Signed)
IP rehab admissions - I am following this patient for possible acute inpatient rehab admission.  We will need an OT consult/evaluation ordered before we can consider him for potential CIR admission.  We will also need insurance authorization prior to potential CIR stay.  Call me for questions.  [phone]

## 2020-11-16 NOTE — Progress Notes (Addendum)
PROGRESS NOTE    Joshua [name]  CWC:[id number] DOB: [birth year] DOA: 11/14/2020 PCP: Jearld Fenton, NP   Brief Narrative:  This 65 years old male with history of COPD, lung cancer, CAD s/p PTCA 2017 who has not been taking Plavix but is still on aspirin due to cost issues, GERD, duodenal ulcer, duodenitis in 2018, noncompliant with PPI presented to the to the GI clinic with complaints of dyspepsia, nausea, loose dark stools for 2 weeks associated with some abdominal discomfort.  He had a history of hemorrhoid for which he underwent surgery in 1990 and stated he was doing pretty good without hematochezia until recently.  He denies any hematemesis.  Patient does report heavy alcohol intake drinks 1 vodka every day. Patient is admitted for GI bleed,  GI is consulted recommended continue PPI twice daily,  Patient will eventually need EGD and colonoscopy.  Patient has developed new onset atrial fibrillation with RVR.  Heart rate is now controlled.  Cardiology is consulted recommended patient is not a candidate for anticoagulation given acute GI bleed and risk of recurrent falls.  Assessment & Plan:   Active Problems:   GIB (gastrointestinal bleeding)  Upper GI bleed: Patient presented with dark-colored stools. Patient does report prior history of duodenitis, duodenal ulcer and has been noncompliant with PPI. He continues to drink alcohol heavily.  Differentials include alcoholic gastritis versus recurrent ulcer disease. Continue pantoprazole 40 mg IV twice daily. Continue clear liquid diet today Hemoglobin remained stable, continue to monitor H&H. GI consulted, plan for EGD and colonoscopy tomorrow.  CAD:  Patient has not been taking Plavix due to the financial barriers. Last PTCA 2018, hold aspirin due to GI bleed.  Denies any chest pain.  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  Continue Neurontin. Wife reported patient did better with the Lyrica but could  not afford it anymore. PT and OT evaluation.  Elevated liver enzymes: This could be due to EtOH abuse Continue to trend liver enzymes.  COPD:  Not in any acute exacerbation,  continue home inhalers.  Hypertension:  continue home medications.  Diabetes melitis: Continue regular insulin sliding scale, continue Neurontin.  ETOH abuse: Patient has been drinking alcohol heavily and he understand this could be contributing to neuropathy and GI bleed and it can worsen above problems.   Counseled to quit and he agrees.   Patient is now having withdrawal symptoms.   Patient was agitated,  given Ativan and placed on soft restraints.  continue CIWA protocol.  New onset  atrial fibrillation with RVR: Heart rate is now controlled, converted into sinus rhythm spontaneously Patient has CHADS Vasc of 5, he is not a candidate for anticoagulation due to acute GI bleed and recurrent falls. Continue metoprolol 25 mg twice daily   DVT prophylaxis: SCDs Code Status: Full code. Family Communication: Wife at bedside Disposition Plan:   Status is: Inpatient  Remains inpatient appropriate because:Inpatient level of care appropriate due to severity of illness  Dispo: The patient is from: Home              Anticipated d/c is to: Home              Patient currently is not medically stable to d/c.   Difficult to place patient No  Consultants:  GI Cardiology  Procedures:  Antimicrobials:  Anti-infectives (From admission, onward)    None        Subjective: Patient was seen and examined at bedside.  Overnight  events noted. Patient was agitated,  having withdrawal symptoms, pulling IV lines. Given Ativan and now remains somnolent.  Heart rate is controlled and regular.  Objective: Vitals:   11/16/20 0850 11/16/20 0920 11/16/20 1213 11/16/20 1348  BP:  (!) 151/95 119/76   Pulse:  82 72   Resp:   (!) 23   Temp:   98 F (36.7 C)   TempSrc:   Oral   SpO2: 97%  94%   Weight:    79.8 kg   Height:    5\' 9"  (1.753 m)    Intake/Output Summary (Last 24 hours) at 11/16/2020 1505 Last data filed at 11/16/2020 0700 Gross per 24 hour  Intake --  Output 1350 ml  Net -1350 ml   Filed Weights   11/16/20 1348  Weight: 79.8 kg    Examination:  General exam: Appears comfortable, deconditioned, not in any acute distress. Respiratory system: Clear to auscultation. Respiratory effort normal. Cardiovascular system: S1-S2 heard, rhythm regular, no murmur,. Gastrointestinal system: Abdomen is soft, nontender, nondistended, BS + Central nervous system: Arousable, sleepy.  No focal neurological deficits. Extremities: No edema, no cyanosis, no clubbing. Skin: No rashes, lesions or ulcers Psychiatry: Judgement and insight appear normal. Mood & affect appropriate.     Data Reviewed: I have personally reviewed following labs and imaging studies  CBC: Recent Labs  Lab 11/14/20 1115 11/14/20 1347 11/14/20 1717 11/14/20 2019 11/15/20 0500 11/15/20 1143 11/16/20 0809  WBC 5.9  --  5.5  --   --   --  6.0  NEUTROABS 3.6  --   --   --   --   --   --   HGB 14.0   < > 12.8* 12.0* 12.0* 12.0* 13.8  HCT 39.7   < > 35.2* 33.8* 33.3* 33.3* 39.3  MCV 104.7*  --  103.5*  --   --   --  105.6*  PLT 102*  --  81*  --   --   --  71*   < > = values in this interval not displayed.   Basic Metabolic Panel: Recent Labs  Lab 11/14/20 1115 11/14/20 1717 11/15/20 0500 11/15/20 1322 11/16/20 0809  NA 141  --  140  --  140  K 3.5  --  3.3*  --  3.3*  CL 102  --  102  --  103  CO2 24  --  28  --  26  GLUCOSE 109*  --  100*  --  103*  BUN 10  --  13  --  7*  CREATININE 0.73  --  0.73  --  0.66  CALCIUM 8.7*  --  8.3*  --  8.9  MG  --  0.8*  --  2.6* 1.6*  PHOS  --  2.5  --   --  2.4*   GFR: Estimated Creatinine Clearance: 93.3 mL/min (by C-G formula based on SCr of 0.66 mg/dL). Liver Function Tests: Recent Labs  Lab 11/14/20 1115 11/16/20 0809  AST 221* 95*  ALT 63* 53*  ALKPHOS  169* 158*  BILITOT 1.0 1.2  PROT 6.6 6.9  ALBUMIN 3.5 3.5   Recent Labs  Lab 11/14/20 1115  LIPASE 92*   No results for input(s): AMMONIA in the last 168 hours. Coagulation Profile: Recent Labs  Lab 11/14/20 1115  INR 0.8   Cardiac Enzymes: No results for input(s): CKTOTAL, CKMB, CKMBINDEX, TROPONINI in the last 168 hours. BNP (last 3 results) No results for input(s): PROBNP in  the last 8760 hours. HbA1C: No results for input(s): HGBA1C in the last 72 hours. CBG: Recent Labs  Lab 11/15/20 0814 11/15/20 1210 11/15/20 1603 11/16/20 1201 11/16/20 1210  GLUCAP 98 153* 129* 98 97   Lipid Profile: No results for input(s): CHOL, HDL, LDLCALC, TRIG, CHOLHDL, LDLDIRECT in the last 72 hours. Thyroid Function Tests: Recent Labs    11/15/20 1322  TSH 2.119   Anemia Panel: No results for input(s): VITAMINB12, FOLATE, FERRITIN, TIBC, IRON, RETICCTPCT in the last 72 hours. Sepsis Labs: No results for input(s): PROCALCITON, LATICACIDVEN in the last 168 hours.  Recent Results (from the past 240 hour(s))  Resp Panel by RT-PCR (Flu A&B, Covid) Nasopharyngeal Swab     Status: None   Collection Time: 11/14/20 11:30 AM   Specimen: Nasopharyngeal Swab; Nasopharyngeal(NP) swabs in vial transport medium  Result Value Ref Range Status   SARS Coronavirus 2 by RT PCR NEGATIVE NEGATIVE Final    Comment: (NOTE) SARS-CoV-2 target nucleic acids are NOT DETECTED.  The SARS-CoV-2 RNA is generally detectable in upper respiratory specimens during the acute phase of infection. The lowest concentration of SARS-CoV-2 viral copies this assay can detect is 138 copies/mL. A negative result does not preclude SARS-Cov-2 infection and should not be used as the sole basis for treatment or other patient management decisions. A negative result may occur with  improper specimen collection/handling, submission of specimen other than nasopharyngeal swab, presence of viral mutation(s) within the areas  targeted by this assay, and inadequate number of viral copies(<138 copies/mL). A negative result must be combined with clinical observations, patient history, and epidemiological information. The expected result is Negative.  Fact Sheet for Patients:  EntrepreneurPulse.com.au  Fact Sheet for Healthcare Providers:  IncredibleEmployment.be  This test is no t yet approved or cleared by the [place] FDA and  has been authorized for detection and/or diagnosis of SARS-CoV-2 by FDA under an Emergency Use Authorization (EUA). This EUA will remain  in effect (meaning this test can be used) for the duration of the COVID-19 declaration under Section 564(b)(1) of the Act, 21 U.S.C.section 360bbb-3(b)(1), unless the authorization is terminated  or revoked sooner.       Influenza A by PCR NEGATIVE NEGATIVE Final   Influenza B by PCR NEGATIVE NEGATIVE Final    Comment: (NOTE) The Xpert Xpress SARS-CoV-2/FLU/RSV plus assay is intended as an aid in the diagnosis of influenza from Nasopharyngeal swab specimens and should not be used as a sole basis for treatment. Nasal washings and aspirates are unacceptable for Xpert Xpress SARS-CoV-2/FLU/RSV testing.  Fact Sheet for Patients: EntrepreneurPulse.com.au  Fact Sheet for Healthcare Providers: IncredibleEmployment.be  This test is not yet approved or cleared by the [place] FDA and has been authorized for detection and/or diagnosis of SARS-CoV-2 by FDA under an Emergency Use Authorization (EUA). This EUA will remain in effect (meaning this test can be used) for the duration of the COVID-19 declaration under Section 564(b)(1) of the Act, 21 U.S.C. section 360bbb-3(b)(1), unless the authorization is terminated or revoked.  Performed at [facility], [place] [address] [place], [place] [address]     Radiology Studies: DG Tibia/Fibula  Right  Result Date: 11/15/2020 CLINICAL DATA:  Fall with unresolved like pain EXAM: RIGHT TIBIA AND FIBULA - 2 VIEW COMPARISON:  None. FINDINGS: There is no evidence of fracture or other focal bone lesions. Soft tissues are unremarkable. Vascular calcification IMPRESSION: Negative. Electronically Signed   By: Donavan Foil M.D.   On: 11/15/2020 20:01  ECHOCARDIOGRAM COMPLETE  Result Date: 11/15/2020    ECHOCARDIOGRAM REPORT   Patient Name:   ORONDE HALLENBECK Kawahara Date of Exam: 11/15/2020 Medical Rec #:  [id number]          Height:       69.0 in Accession #:    [id number]         Weight:       176.0 lb Date of Birth:  1955-07-22          BSA:          1.957 m Patient Age:    41 years           BP:           159/80 mmHg Patient Gender: M                  HR:           95 bpm. Exam Location:  Inpatient Procedure: 2D Echo, Color Doppler and Cardiac Doppler Indications:    I48.91* Unspecified atrial fibrillation  History:        Patient has no prior history of Echocardiogram examinations.                 COPD; Risk Factors:Hypertension, Diabetes and Dyslipidemia.  Sonographer:    Raquel Sarna Senior RDCS Referring Phys: [id number] [place]  1. Left ventricular ejection fraction, by estimation, is 60 to 65%. The left ventricle has normal function. The left ventricle has no regional wall motion abnormalities. Left ventricular diastolic parameters were normal.  2. Right ventricular systolic function is normal. The right ventricular size is normal. Tricuspid regurgitation signal is inadequate for assessing PA pressure.  3. The mitral valve is grossly normal. Trivial mitral valve regurgitation. No evidence of mitral stenosis.  4. The aortic valve was not well visualized. Aortic valve regurgitation is not visualized. No aortic stenosis is present.  5. The inferior vena cava is normal in size with greater than 50% respiratory variability, suggesting right atrial pressure of 3 mmHg. Comparison(s): No prior  Echocardiogram. Conclusion(s)/Recommendation(s): Normal biventricular function without evidence of hemodynamically significant valvular heart disease. FINDINGS  Left Ventricle: Left ventricular ejection fraction, by estimation, is 60 to 65%. The left ventricle has normal function. The left ventricle has no regional wall motion abnormalities. The left ventricular internal cavity size was normal in size. There is  no left ventricular hypertrophy. Left ventricular diastolic parameters were normal. Right Ventricle: The right ventricular size is normal. Right vetricular wall thickness was not well visualized. Right ventricular systolic function is normal. Tricuspid regurgitation signal is inadequate for assessing PA pressure. Left Atrium: Left atrial size was normal in size. Right Atrium: Right atrial size was normal in size. Pericardium: There is no evidence of pericardial effusion. Presence of pericardial fat pad. Mitral Valve: The mitral valve is grossly normal. Trivial mitral valve regurgitation. No evidence of mitral valve stenosis. Tricuspid Valve: The tricuspid valve is grossly normal. Tricuspid valve regurgitation is not demonstrated. No evidence of tricuspid stenosis. Aortic Valve: The aortic valve was not well visualized. Aortic valve regurgitation is not visualized. No aortic stenosis is present. Pulmonic Valve: The pulmonic valve was not well visualized. Pulmonic valve regurgitation is not visualized. No evidence of pulmonic stenosis. Aorta: The aortic root and ascending aorta are structurally normal, with no evidence of dilitation and the aortic arch was not well visualized. Venous: The inferior vena cava is normal in size with greater than 50% respiratory variability, suggesting right atrial pressure of  3 mmHg. IAS/Shunts: The atrial septum is grossly normal.  LEFT VENTRICLE PLAX 2D LVIDd:         4.90 cm  Diastology LVIDs:         3.20 cm  LV e' medial:    6.00 cm/s LV PW:         0.80 cm  LV E/e' medial:   12.5 LV IVS:        0.80 cm  LV e' lateral:   7.00 cm/s LVOT diam:     2.10 cm  LV E/e' lateral: 10.7 LV SV:         67 LV SV Index:   34 LVOT Area:     3.46 cm  RIGHT VENTRICLE RV S prime:     12.50 cm/s TAPSE (M-mode): 2.3 cm LEFT ATRIUM             Index       RIGHT ATRIUM           Index LA diam:        3.80 cm 1.94 cm/m  RA Area:     16.60 cm LA Vol (A2C):   27.9 ml 14.26 ml/m RA Volume:   39.60 ml  20.24 ml/m LA Vol (A4C):   34.2 ml 17.48 ml/m LA Biplane Vol: 32.1 ml 16.40 ml/m  AORTIC VALVE LVOT Vmax:   118.00 cm/s LVOT Vmean:  74.000 cm/s LVOT VTI:    0.192 m  AORTA Ao Root diam: 3.60 cm Ao Asc diam:  3.40 cm MV E velocity: 75.00 cm/s MV A velocity: 82.00 cm/s  SHUNTS MV E/A ratio:  0.91        Systemic VTI:  0.19 m                            Systemic Diam: 2.10 cm Buford Dresser MD Electronically signed by Buford Dresser MD Signature Date/Time: 11/15/2020/5:56:57 PM    Final     Scheduled Meds:  atorvastatin  40 mg Oral Daily   fluticasone furoate-vilanterol  1 puff Inhalation Daily   And   umeclidinium bromide  1 puff Inhalation Daily   folic acid  1 mg Oral Daily   gabapentin  300 mg Oral TID   loratadine  10 mg Oral Daily   losartan  25 mg Oral Daily   metoprolol tartrate  25 mg Oral BID   multivitamin with minerals  1 tablet Oral Daily   nicotine  14 mg Transdermal Daily   pantoprazole (PROTONIX) IV  40 mg Intravenous Q12H   potassium chloride  40 mEq Oral Once   thiamine  100 mg Oral Daily   Or   thiamine  100 mg Intravenous Daily   traZODone  50 mg Oral QHS   Continuous Infusions:  lactated ringers 75 mL/hr at 11/15/20 0152   magnesium sulfate bolus IVPB       LOS: 1 day    Time spent: 25 mins    Sohum Delillo, MD [facility]   If 7PM-7AM, please contact night-coverage

## 2020-11-16 NOTE — Progress Notes (Addendum)
Progress Note  Patient Name: Joshua [name] Date of Encounter: 11/16/2020  [facility] Cardiologist: Candee Furbish, MD   Subjective   Hands are restrained, awake and alert, but is confused, ?describing some hallucinations  Inpatient Medications    Scheduled Meds:  atorvastatin  40 mg Oral Daily   fluticasone furoate-vilanterol  1 puff Inhalation Daily   And   umeclidinium bromide  1 puff Inhalation Daily   folic acid  1 mg Oral Daily   gabapentin  300 mg Oral TID   loratadine  10 mg Oral Daily   losartan  25 mg Oral Daily   metoprolol tartrate  25 mg Oral BID   multivitamin with minerals  1 tablet Oral Daily   nicotine  14 mg Transdermal Daily   pantoprazole (PROTONIX) IV  40 mg Intravenous Q12H   thiamine  100 mg Oral Daily   Or   thiamine  100 mg Intravenous Daily   traZODone  50 mg Oral QHS   Continuous Infusions:  lactated ringers 75 mL/hr at 11/15/20 0152   PRN Meds: acetaminophen, albuterol, hydrALAZINE, LORazepam **OR** LORazepam, ondansetron **OR** ondansetron (ZOFRAN) IV, tiZANidine   Vital Signs    Vitals:   11/16/20 0800 11/16/20 0849 11/16/20 0850 11/16/20 0920  BP:    (!) 151/95  Pulse: 82   82  Resp:      Temp:      TempSrc:      SpO2:  96% 97%     Intake/Output Summary (Last 24 hours) at 11/16/2020 1001 Last data filed at 11/15/2020 1900 Gross per 24 hour  Intake 240 ml  Output 1100 ml  Net -860 ml   Last 3 Weights 11/14/2020 06/20/2020 05/11/2020  Weight (lbs) 176 lb 188 lb 185 lb 8 oz  Weight (kg) 79.833 kg 85.276 kg 84.142 kg      Telemetry    SR, PACs - Personally Reviewed  ECG    None today - Personally Reviewed  Physical Exam   GEN: No acute distress.   Neck: No JVD Cardiac: RRR, no murmurs, rubs, or gallops.  Respiratory: Clear to auscultation bilaterally except some rhonchi that decreases w cough. GI: Soft, nontender, non-distended  MS: No edema; No deformity. Neuro:  Nonfocal  Psych: Normal affect   Labs    High  Sensitivity Troponin:  No results for input(s): TROPONINIHS in the last 720 hours.    Chemistry Recent Labs  Lab 11/14/20 1115 11/15/20 0500 11/16/20 0809  NA 141 140 140  K 3.5 3.3* 3.3*  CL 102 102 103  CO2 24 28 26   GLUCOSE 109* 100* 103*  BUN 10 13 7*  CREATININE 0.73 0.73 0.66  CALCIUM 8.7* 8.3* 8.9  PROT 6.6  --  6.9  ALBUMIN 3.5  --  3.5  AST 221*  --  95*  ALT 63*  --  53*  ALKPHOS 169*  --  158*  BILITOT 1.0  --  1.2  GFRNONAA >60 >60 >60  ANIONGAP 15 10 11     Magnesium  Date Value Ref Range Status  11/16/2020 1.6 (L) 1.7 - 2.4 mg/dL Final    Comment:    Performed at [facility], [place] [address], [place], [place] [address]  11/15/2020 2.6 (H) 1.7 - 2.4 mg/dL Final    Comment:    Performed at [facility], [place] [address], [place], [place] [address]  11/14/2020 0.8 (LL) 1.7 - 2.4 mg/dL Final    Comment:  CRITICAL RESULT CALLED TO, READ BACK BY AND VERIFIED WITH: Roda Shutters AT [facility] ON 11/14/2020 BY P,LUZOLO Performed at [facility] [address] [place], [place] [address]      Hematology Recent Labs  Lab 11/14/20 1115 11/14/20 1347 11/14/20 1717 11/14/20 2019 11/15/20 0500 11/15/20 1143 11/16/20 0809  WBC 5.9  --  5.5  --   --   --  6.0  RBC 3.79*  --  3.40*  --   --   --  3.72*  HGB 14.0   < > 12.8*   < > 12.0* 12.0* 13.8  HCT 39.7   < > 35.2*   < > 33.3* 33.3* 39.3  MCV 104.7*  --  103.5*  --   --   --  105.6*  MCH 36.9*  --  37.6*  --   --   --  37.1*  MCHC 35.3  --  36.4*  --   --   --  35.1  RDW 13.0  --  12.9  --   --   --  12.7  PLT 102*  --  81*  --   --   --  71*   < > = values in this interval not displayed.   Lab Results  Component Value Date   TSH 2.119 11/15/2020   Lab Results  Component Value Date   HGBA1C 5.3 07/09/2019   Lab Results  Component Value Date   CHOL 192 07/09/2019   HDL 101 07/09/2019   LDLCALC 75 07/09/2019   LDLDIRECT 107.0 04/21/2015   TRIG  79 07/09/2019   CHOLHDL 1.9 07/09/2019   Lab Results  Component Value Date   TSH 2.119 11/15/2020   Lab Results  Component Value Date   HGBA1C 5.3 07/09/2019   Lab Results  Component Value Date   CHOL 192 07/09/2019   HDL 101 07/09/2019   LDLCALC 75 07/09/2019   LDLDIRECT 107.0 04/21/2015   TRIG 79 07/09/2019   CHOLHDL 1.9 07/09/2019    BNPNo results for input(s): BNP, PROBNP in the last 168 hours.   DDimer No results for input(s): DDIMER in the last 168 hours.   Radiology    DG Tibia/Fibula Right  Result Date: 11/15/2020 CLINICAL DATA:  Fall with unresolved like pain EXAM: RIGHT TIBIA AND FIBULA - 2 VIEW COMPARISON:  None. FINDINGS: There is no evidence of fracture or other focal bone lesions. Soft tissues are unremarkable. Vascular calcification IMPRESSION: Negative. Electronically Signed   By: Donavan Foil M.D.   On: 11/15/2020 20:01   DG Chest Port 1 View  Result Date: 11/14/2020 CLINICAL DATA:  Weakness EXAM: PORTABLE CHEST 1 VIEW COMPARISON:  CT chest dated Aug 06, 2019 FINDINGS: Cardiac and mediastinal contours are unchanged and within normal limits. Bandlike consolidation of the right upper lobe. Lungs are otherwise clear. No evidence pleural effusion or pneumothorax. IMPRESSION: Bandlike consolidation of the right upper lobe, likely posttreatment changes. Lungs are otherwise clear. Electronically Signed   By: Yetta Glassman M.D.   On: 11/14/2020 12:13   ECHOCARDIOGRAM COMPLETE  Result Date: 11/15/2020    ECHOCARDIOGRAM REPORT   Patient Name:   Joshua [name] Date of Exam: 11/15/2020 Medical Rec #:  [id number]          Height:       69.0 in Accession #:    [id number]         Weight:       176.0 lb Date of Birth:  15-Oct-1955  BSA:          1.957 m Patient Age:    1 years           BP:           159/80 mmHg Patient Gender: M                  HR:           95 bpm. Exam Location:  Inpatient Procedure: 2D Echo, Color Doppler and Cardiac Doppler Indications:     I48.91* Unspecified atrial fibrillation  History:        Patient has no prior history of Echocardiogram examinations.                 COPD; Risk Factors:Hypertension, Diabetes and Dyslipidemia.  Sonographer:    Raquel Sarna Senior RDCS Referring Phys: [id number] [place]  1. Left ventricular ejection fraction, by estimation, is 60 to 65%. The left ventricle has normal function. The left ventricle has no regional wall motion abnormalities. Left ventricular diastolic parameters were normal.  2. Right ventricular systolic function is normal. The right ventricular size is normal. Tricuspid regurgitation signal is inadequate for assessing PA pressure.  3. The mitral valve is grossly normal. Trivial mitral valve regurgitation. No evidence of mitral stenosis.  4. The aortic valve was not well visualized. Aortic valve regurgitation is not visualized. No aortic stenosis is present.  5. The inferior vena cava is normal in size with greater than 50% respiratory variability, suggesting right atrial pressure of 3 mmHg. Comparison(s): No prior Echocardiogram. Conclusion(s)/Recommendation(s): Normal biventricular function without evidence of hemodynamically significant valvular heart disease. FINDINGS  Left Ventricle: Left ventricular ejection fraction, by estimation, is 60 to 65%. The left ventricle has normal function. The left ventricle has no regional wall motion abnormalities. The left ventricular internal cavity size was normal in size. There is  no left ventricular hypertrophy. Left ventricular diastolic parameters were normal. Right Ventricle: The right ventricular size is normal. Right vetricular wall thickness was not well visualized. Right ventricular systolic function is normal. Tricuspid regurgitation signal is inadequate for assessing PA pressure. Left Atrium: Left atrial size was normal in size. Right Atrium: Right atrial size was normal in size. Pericardium: There is no evidence of pericardial effusion.  Presence of pericardial fat pad. Mitral Valve: The mitral valve is grossly normal. Trivial mitral valve regurgitation. No evidence of mitral valve stenosis. Tricuspid Valve: The tricuspid valve is grossly normal. Tricuspid valve regurgitation is not demonstrated. No evidence of tricuspid stenosis. Aortic Valve: The aortic valve was not well visualized. Aortic valve regurgitation is not visualized. No aortic stenosis is present. Pulmonic Valve: The pulmonic valve was not well visualized. Pulmonic valve regurgitation is not visualized. No evidence of pulmonic stenosis. Aorta: The aortic root and ascending aorta are structurally normal, with no evidence of dilitation and the aortic arch was not well visualized. Venous: The inferior vena cava is normal in size with greater than 50% respiratory variability, suggesting right atrial pressure of 3 mmHg. IAS/Shunts: The atrial septum is grossly normal.  LEFT VENTRICLE PLAX 2D LVIDd:         4.90 cm  Diastology LVIDs:         3.20 cm  LV e' medial:    6.00 cm/s LV PW:         0.80 cm  LV E/e' medial:  12.5 LV IVS:        0.80 cm  LV e' lateral:   7.00  cm/s LVOT diam:     2.10 cm  LV E/e' lateral: 10.7 LV SV:         67 LV SV Index:   34 LVOT Area:     3.46 cm  RIGHT VENTRICLE RV S prime:     12.50 cm/s TAPSE (M-mode): 2.3 cm LEFT ATRIUM             Index       RIGHT ATRIUM           Index LA diam:        3.80 cm 1.94 cm/m  RA Area:     16.60 cm LA Vol (A2C):   27.9 ml 14.26 ml/m RA Volume:   39.60 ml  20.24 ml/m LA Vol (A4C):   34.2 ml 17.48 ml/m LA Biplane Vol: 32.1 ml 16.40 ml/m  AORTIC VALVE LVOT Vmax:   118.00 cm/s LVOT Vmean:  74.000 cm/s LVOT VTI:    0.192 m  AORTA Ao Root diam: 3.60 cm Ao Asc diam:  3.40 cm MV E velocity: 75.00 cm/s MV A velocity: 82.00 cm/s  SHUNTS MV E/A ratio:  0.91        Systemic VTI:  0.19 m                            Systemic Diam: 2.10 cm Buford Dresser MD Electronically signed by Buford Dresser MD Signature Date/Time:  11/15/2020/5:56:57 PM    Final     Cardiac Studies   ECHO: 11/15/2020  1. Left ventricular ejection fraction, by estimation, is 60 to 65%. The  left ventricle has normal function. The left ventricle has no regional  wall motion abnormalities. Left ventricular diastolic parameters were  normal.   2. Right ventricular systolic function is normal. The right ventricular  size is normal. Tricuspid regurgitation signal is inadequate for assessing PA pressure.   3. The mitral valve is grossly normal. Trivial mitral valve  regurgitation. No evidence of mitral stenosis.   4. The aortic valve was not well visualized. Aortic valve regurgitation is not visualized. No aortic stenosis is present.   5. The inferior vena cava is normal in size with greater than 50%  respiratory variability, suggesting right atrial pressure of 3 mmHg.   Comparison(s): No prior Echocardiogram.   Conclusion(s)/Recommendation(s): Normal biventricular function without evidence of hemodynamically significant valvular heart disease.    Patient Profile     65 y.o. male  with a history of CAD s/p rotational atherectomy and stenting with DES x2 to tandem proximal and mid LAD lesions in 05/2015, PAD s/p aortobifemoral bypass in 2010 at [place] and then orbital atherectomy and drug-coated balloon angioplasty to the ostial/proximal right SFA in 12/2017, mild bilateral carotid stenosis (1-39%) on dopplers in 11/2017, COPD, HTN, HLD, DM2 with diabetic neuropathy, GERD, and polysubstance abuse (tobacco and alcohol), who was admitted 08/31 with black diarrhea, nausea, abdominal pain. Dx UGIB, went into rapid Afib >> Cards consult.   Assessment & Plan    New Onset Atrial Fibrillation - episode lasted about 4 hr, pt unaware, spont conversion to SR - happened in setting of ETOH use and UGIB - CHA2DS2-VASc = 5, s/p IVC filter for DVT/PE - w/ UGIB, risks > benefits of anticoag, can discuss as outpt - TSH nl - EF nl on echo w/ no WMA -  maintaining SR - Mg & K+ low, will supplement  CAD S/p DES x2 to LAD in 2017.  -  on statin, no ASA 2nd UGIB.  Recommend restart aspirin given stable hemoglobin with history of coronary stenting - on BB   PAD S/p aortobifemoral bypass in 2010 at [place] and then orbital atherectomy and drug-coated balloon angioplasty to the ostial/proximal right SFA in 12/2017. - f/u as outpt    Hypertension - SBP 150s-180s last 24 hr  - renal function normal, HR > 70 - will increase Lopressor to 50 mg bid, can increase losartan at some point as well   Hyperlipidemia  - hold Lipitor w/ elevated LFTs - LDL 107 (direct) on labs   Type 2 Diabetes Mellitus - per IM   Upper GI Bleed History of Duodenitis/Duodenal Ulcer - H&H down slightly but stabilized - GI seeing   Alcohol Abuse - on CIWA protocol - seems to be having WD sx - per IM   Otherwise, per primary team: - COPD, - Elevated LFTs: previously felt to be due to alcohol use/fatty lver  - Arthritis    For questions or updates, please contact [facility] Please consult [email or web address] for contact info under    Signed, Rosaria Ferries, PA-C  11/16/2020, 10:01 AM    Patient seen and examined.  Agree with above documentation.  On exam, patient is somnolent, arousable but not answering questions appropriately, regular rate and rhythm, no murmurs, lungs clear in anterior fields, no lower extremity edema.  Telemetry reviewed and shows no further A. fib, normal sinus rhythm with rate 60s to 90s.  Echo shows normal biventricular function, no significant valvular disease.  Started on metoprolol, would continue.  Not a good candidate for anticoagulation given GI bleed and frequent falls.  Continue to monitor on telemetry.  Donato Heinz, MD

## 2020-11-16 NOTE — Progress Notes (Signed)
IP rehab admissions - I spoke with wife by phone today.  Wife reports that patient is going through withdrawal from ETOH and is currently confused.  Wife says patient may not be interested in CIR.  I will await OT eval and then will have one of my partners follow up.  Call for questions.  [phone]

## 2020-11-16 NOTE — TOC Progression Note (Signed)
Transition of Care [facility]) - Progression Note    Patient Details  Name: Joshua [name] MRN: [id number] Date of Birth: August 26, 1955  [facility] CM/SW Contact  Dae Antonucci, Juliann Pulse, RN Phone Number: 11/16/2020, 10:11 AM  Clinical Narrative: Noted CIR to eval-await recc.      Expected Discharge Plan: IP Rehab Facility Barriers to Discharge: Continued Medical Work up  Expected Discharge Plan and Services Expected Discharge Plan: [place]   Discharge Planning Services: CM Consult   Living arrangements for the past 2 months: Single Family Home                                       Social Determinants of Health (SDOH) Interventions    Readmission Risk Interventions No flowsheet data found.

## 2020-11-16 NOTE — Plan of Care (Signed)
Patient tolerated restraints today. Would like to come out of them, but pt continues to pull of monitor leads and pulled out one IV today. Patient urinated 450 ml this morning and didn't urinate all day. I told him I had to bladder scan him and he asked me if I wanted him to pee and I said yes and he urinated 850 ml at once. He stated he only pees twice a day. Wife Joshua [name] came by [phone] and stayed around 4 hours and will visit again tomorrow.

## 2020-11-16 NOTE — Evaluation (Signed)
Occupational Therapy Evaluation Patient Details Name: COLTYN HANNING MRN: [id number] DOB: [birth year] Today's Date: 11/16/2020    History of Present Illness BRITTIAN RENALDO is a 65 y.o. male presents to GI clinic with c/o dyspepsia, nausea, loose dark stools x 2 weeks associated with some abdominal discomfort. PMH: neuropathy, COPD, lung ca, CAD s/p PTCA 2017, GERD, duodenal ulcer/duodenitis in 2018, diabetes, HTN, PAD, positive TB test, ACDF, posterior laminectomy/decompression lumbar spine, cadiac caths   Clinical Impression   Mr. Mandell Pangborn is a 65 year old man who presents with impaired balance, decreased activity tolerance, poor safety awareness and insight in to deficits and medical condition. Patient presents in soft restraints that therapist released for evaluation. Patient supervision for bed mobility with use of bed rail and min assist x 2 for standing and ambulation. Patient unsteady and impulsive with movement but able to follow commands. Patient needing increased assistance with ADLs due to poor balance. Patient will benefit from skilled OT services while in hospital to improve deficits and learn compensatory strategies as needed in order to return PLOF.  OT agrees with CIR recommendation as patient was at high level of independence prior to admission - though do expect him to improve after withdrawals and the need for sedating medications.     Follow Up Recommendations  CIR    Equipment Recommendations  None recommended by OT    Recommendations for Other Services       Precautions / Restrictions Precautions Precautions: Fall Restrictions Weight Bearing Restrictions: No      Mobility Bed Mobility Overal bed mobility: Needs Assistance Bed Mobility: Supine to Sit;Sit to Supine     Supine to sit: Supervision Sit to supine: Supervision   General bed mobility comments: light use of bedrail to upright self to sitting, supv for safety    Transfers Overall  transfer level: Needs assistance Equipment used: Rolling walker (2 wheeled) Transfers: Sit to/from Stand Sit to Stand: Min assist         General transfer comment: Min assist to walk approx 20 feet with RW - +2 assistance for safety as patient is impulsive and unsteady and assistance needed for telemetry, IV and catheter    Balance Overall balance assessment: Needs assistance Sitting-balance support: No upper extremity supported Sitting balance-Leahy Scale: Fair     Standing balance support: During functional activity;Bilateral upper extremity supported Standing balance-Leahy Scale: Poor Standing balance comment: UE support and min assist                           ADL either performed or assessed with clinical judgement   ADL Overall ADL's : Needs assistance/impaired Eating/Feeding: Set up   Grooming: Set up   Upper Body Bathing: Minimal assistance   Lower Body Bathing: Moderate assistance;+2 for safety/equipment;Sit to/from stand   Upper Body Dressing : Set up;Sitting   Lower Body Dressing: Moderate assistance;Sit to/from stand;+2 for safety/equipment   Toilet Transfer: Minimal assistance;+2 for safety/equipment;Regular Toilet;Grab bars   Toileting- Clothing Manipulation and Hygiene: Sit to/from stand;Maximal assistance               Vision Patient Visual Report: No change from baseline Vision Assessment?: No apparent visual deficits     Perception     Praxis      Pertinent Vitals/Pain Pain Assessment: Faces Faces Pain Scale: Hurts a little bit Pain Location: R ankle Pain Descriptors / Indicators: Sore Pain Intervention(s): Monitored during session     Hand  Dominance Right   Extremity/Trunk Assessment Upper Extremity Assessment Upper Extremity Assessment: Overall WFL for tasks assessed (WFL ROM, 5/5 strength except for 4/5 grip)       Cervical / Trunk Assessment Cervical / Trunk Assessment: Normal   Communication  Communication Communication: No difficulties   Cognition Arousal/Alertness: Awake/alert Behavior During Therapy: WFL for tasks assessed/performed Overall Cognitive Status: Within Functional Limits for tasks assessed                                     General Comments       Exercises     Shoulder Instructions      Home Living Family/patient expects to be discharged to:: Private residence Living Arrangements: Spouse/significant other Available Help at Discharge: Family;Available PRN/intermittently Type of Home: House Home Access: Ramped entrance     Home Layout: One level     Bathroom Shower/Tub: Teacher, early years/pre: Standard     Home Equipment: Cane - single point;Walker - 2 wheels          Prior Functioning/Environment Level of Independence: Independent        Comments: Pt independent, fishes on land or in boat, no DME- the past 2 weeks pt using SPC and 2 falls due to BLE weakness        OT Problem List: Decreased activity tolerance;Impaired balance (sitting and/or standing);Decreased cognition;Decreased safety awareness;Decreased knowledge of use of DME or AE;Pain      OT Treatment/Interventions: Self-care/ADL training;DME and/or AE instruction;Therapeutic activities;Balance training;Patient/family education    OT Goals(Current goals can be found in the care plan section) Acute Rehab OT Goals OT Goal Formulation: Patient unable to participate in goal setting Time For Goal Achievement: 11/30/20 Potential to Achieve Goals: Good  OT Frequency: Min 2X/week   Barriers to D/C:            Co-evaluation              AM-PAC OT "6 Clicks" Daily Activity     Outcome Measure Help from another person eating meals?: A Little Help from another person taking care of personal grooming?: A Little Help from another person toileting, which includes using toliet, bedpan, or urinal?: A Lot Help from another person bathing (including  washing, rinsing, drying)?: A Lot Help from another person to put on and taking off regular upper body clothing?: A Little Help from another person to put on and taking off regular lower body clothing?: A Lot 6 Click Score: 15   End of Session Equipment Utilized During Treatment: Rolling walker Nurse Communication: Mobility status  Activity Tolerance: Patient tolerated treatment well Patient left: in bed;with call bell/phone within reach;with bed alarm set  OT Visit Diagnosis: Unsteadiness on feet (R26.81)                Time: 6283-1517 OT Time Calculation (min): 15 min Charges:  OT General Charges $OT Visit: 1 Visit OT Evaluation $OT Eval Moderate Complexity: 1 Mod  Skylier Kretschmer, OTR/L [place]  Office [phone] Pager: [place] 11/16/2020, 4:38 PM

## 2020-11-16 NOTE — Progress Notes (Addendum)
Progress Note Hospital Day: 3  Chief Complaint:   black stools, rectal bleeding, recent nausea / vomiting and upper abdominal pain       ASSESSMENT AND PLAN  Brief History:  65 yo male with multiple medical problems such as COPD, hx of lung cancer, CAD s/p PTCA in 2017( he hasn't been on Plavix due to cost), PAD on daily ASA, H.pylori related gastroduodenitis in 2018 ( didn't complete treatment), DM with neuropathy, cholelithiasis, frequent falls, and Etoh abuse. Patient seen in our office 8/30 for abdominal pain , diarrhea, complaints of both dark stool and bright red blood per rectum. Clinically he didn't seem well and was sent to ED for further evaluation. Admitted for further evaluation. Has been diagnosed with new onset Afib   # Two week history of nausea / vomiting / epigastric pain / diarrhea / black stools AND bright red blood per rectum. Probably upper GI bleed. Hx of h.pylori related gastroduodenitis / duodenal ulcer in 2018 ( couldn't afford antibiotics for H.pylori.) Rule out recurrent PUD. Esophageal varices, portal gastropathy not excluded --Hgb remains stable at 13.8 --Continue BID IV PPI --Plan was for EGD and colonoscopy tomorrow. However, it appears he is now starting to have Etoh withdrawal. He is wearing restraints and is not oriented to time or place. I strongly doubt that we will be able to proceed with procedures tomorrow but will keep on clears and reassess later today   # Etoh abuse / elevated liver chemistries ( most likely Etoh related).  INR normal. He has thrombocytopenia (platelet count of 71) which could be secondary to bone marrow suppression related to Etoh and / or consumption from bleeding. CTAP in Aug 2021 suggested fatty liver. --AST / ALT improving.  --Hep A total ab is reactive ( most likely IgG). HCV ab negative. HBV core ab negative ( no past infection), HBV surf ag negative. Awaiting HBV surface ab to determine immunity through vaccination. --Appears  to be having withdrawals now. He is confused.  On CIWA, requring IV Ativan --BP 151/95, HR 82   # New onset Afib with RVR, rate controlled on Cardizem. Cardiology on board. Holding on anticoagulation given GI bleed and recent falls.   # Electrolyte imbalance, repletion in progress   # Cholelithiasis, suspect asymptomatic at this point.    # Etoh abuse. Drinks a pint of liquor a day. Liver chemistries elevated in pattern of Etoh.  -Needs permanent discontinuation of Etoh   # CAD / PTCA in 2018. Not taking Plavix due to cost.    # COPD , stable. Ongoing tobacco abuse     SUBJECTIVE   Confused.        OBJECTIVE      Scheduled inpatient medications:   atorvastatin  40 mg Oral Daily   fluticasone furoate-vilanterol  1 puff Inhalation Daily   And   umeclidinium bromide  1 puff Inhalation Daily   folic acid  1 mg Oral Daily   gabapentin  300 mg Oral TID   loratadine  10 mg Oral Daily   losartan  25 mg Oral Daily   metoprolol tartrate  25 mg Oral BID   multivitamin with minerals  1 tablet Oral Daily   nicotine  14 mg Transdermal Daily   pantoprazole (PROTONIX) IV  40 mg Intravenous Q12H   thiamine  100 mg Oral Daily   Or   thiamine  100 mg Intravenous Daily   traZODone  50 mg Oral QHS   Continuous inpatient  infusions:   lactated ringers 75 mL/hr at 11/15/20 0152   PRN inpatient medications: acetaminophen, albuterol, hydrALAZINE, LORazepam **OR** LORazepam, ondansetron **OR** ondansetron (ZOFRAN) IV, tiZANidine  Vital signs in last 24 hours: Temp:  [98.2 F (36.8 C)-98.6 F (37 C)] 98.2 F (36.8 C) (08/31 1539) Pulse Rate:  [77-84] 82 (09/01 0800) BP: (168-185)/(95-99) 170/99 (09/01 0034) SpO2:  [96 %-97 %] 97 % (09/01 0850)    Intake/Output Summary (Last 24 hours) at 11/16/2020 0903 Last data filed at 11/15/2020 1900 Gross per 24 hour  Intake 240 ml  Output 1100 ml  Net -860 ml     Physical Exam:  General: Alert male in NAD. In soft restraints. Wife in  room Heart:  Regular rate. No lower extremity edema Pulmonary: Normal respiratory effort. Breath sounds diminished bilaterally, a few wheezes throughout.  Abdomen: Soft, nondistended, nontender. Normal bowel sounds.  Neurologic: Alert and oriented Psych: Pleasant. Cooperative.     Intake/Output from previous day: 08/31 0701 - 09/01 0700 In: 240 [P.O.:240] Out: 1100 [Urine:1100] Intake/Output this shift: No intake/output data recorded.    Lab Results: Recent Labs    11/14/20 1115 11/14/20 1347 11/14/20 1717 11/14/20 2019 11/15/20 0500 11/15/20 1143 11/16/20 0809  WBC 5.9  --  5.5  --   --   --  6.0  HGB 14.0   < > 12.8*   < > 12.0* 12.0* 13.8  HCT 39.7   < > 35.2*   < > 33.3* 33.3* 39.3  PLT 102*  --  81*  --   --   --  71*   < > = values in this interval not displayed.   BMET Recent Labs    11/14/20 1115 11/15/20 0500 11/16/20 0809  NA 141 140 140  K 3.5 3.3* 3.3*  CL 102 102 103  CO2 24 28 26   GLUCOSE 109* 100* 103*  BUN 10 13 7*  CREATININE 0.73 0.73 0.66  CALCIUM 8.7* 8.3* 8.9   LFT Recent Labs    11/16/20 0809  PROT 6.9  ALBUMIN 3.5  AST 95*  ALT 53*  ALKPHOS 158*  BILITOT 1.2   PT/INR Recent Labs    11/14/20 1115  LABPROT 11.0*  INR 0.8   Hepatitis Panel Recent Labs    11/15/20 1143  HEPBSAG NON REACTIVE  HCVAB NON REACTIVE    DG Tibia/Fibula Right  Result Date: 11/15/2020 CLINICAL DATA:  Fall with unresolved like pain EXAM: RIGHT TIBIA AND FIBULA - 2 VIEW COMPARISON:  None. FINDINGS: There is no evidence of fracture or other focal bone lesions. Soft tissues are unremarkable. Vascular calcification IMPRESSION: Negative. Electronically Signed   By: Donavan Foil M.D.   On: 11/15/2020 20:01   DG Chest Port 1 View  Result Date: 11/14/2020 CLINICAL DATA:  Weakness EXAM: PORTABLE CHEST 1 VIEW COMPARISON:  CT chest dated Aug 06, 2019 FINDINGS: Cardiac and mediastinal contours are unchanged and within normal limits. Bandlike consolidation  of the right upper lobe. Lungs are otherwise clear. No evidence pleural effusion or pneumothorax. IMPRESSION: Bandlike consolidation of the right upper lobe, likely posttreatment changes. Lungs are otherwise clear. Electronically Signed   By: Yetta Glassman M.D.   On: 11/14/2020 12:13   ECHOCARDIOGRAM COMPLETE  Result Date: 11/15/2020    ECHOCARDIOGRAM REPORT   Patient Name:   JOSPEH MANGEL Scalzo Date of Exam: 11/15/2020 Medical Rec #:  [id number]          Height:       69.0 in Accession #:  [id number]         Weight:       176.0 lb Date of Birth:  11/11/1955          BSA:          1.957 m Patient Age:    82 years           BP:           159/80 mmHg Patient Gender: M                  HR:           95 bpm. Exam Location:  Inpatient Procedure: 2D Echo, Color Doppler and Cardiac Doppler Indications:    I48.91* Unspecified atrial fibrillation  History:        Patient has no prior history of Echocardiogram examinations.                 COPD; Risk Factors:Hypertension, Diabetes and Dyslipidemia.  Sonographer:    Raquel Sarna Senior RDCS Referring Phys: [id number] [place]  1. Left ventricular ejection fraction, by estimation, is 60 to 65%. The left ventricle has normal function. The left ventricle has no regional wall motion abnormalities. Left ventricular diastolic parameters were normal.  2. Right ventricular systolic function is normal. The right ventricular size is normal. Tricuspid regurgitation signal is inadequate for assessing PA pressure.  3. The mitral valve is grossly normal. Trivial mitral valve regurgitation. No evidence of mitral stenosis.  4. The aortic valve was not well visualized. Aortic valve regurgitation is not visualized. No aortic stenosis is present.  5. The inferior vena cava is normal in size with greater than 50% respiratory variability, suggesting right atrial pressure of 3 mmHg. Comparison(s): No prior Echocardiogram. Conclusion(s)/Recommendation(s): Normal biventricular  function without evidence of hemodynamically significant valvular heart disease. FINDINGS  Left Ventricle: Left ventricular ejection fraction, by estimation, is 60 to 65%. The left ventricle has normal function. The left ventricle has no regional wall motion abnormalities. The left ventricular internal cavity size was normal in size. There is  no left ventricular hypertrophy. Left ventricular diastolic parameters were normal. Right Ventricle: The right ventricular size is normal. Right vetricular wall thickness was not well visualized. Right ventricular systolic function is normal. Tricuspid regurgitation signal is inadequate for assessing PA pressure. Left Atrium: Left atrial size was normal in size. Right Atrium: Right atrial size was normal in size. Pericardium: There is no evidence of pericardial effusion. Presence of pericardial fat pad. Mitral Valve: The mitral valve is grossly normal. Trivial mitral valve regurgitation. No evidence of mitral valve stenosis. Tricuspid Valve: The tricuspid valve is grossly normal. Tricuspid valve regurgitation is not demonstrated. No evidence of tricuspid stenosis. Aortic Valve: The aortic valve was not well visualized. Aortic valve regurgitation is not visualized. No aortic stenosis is present. Pulmonic Valve: The pulmonic valve was not well visualized. Pulmonic valve regurgitation is not visualized. No evidence of pulmonic stenosis. Aorta: The aortic root and ascending aorta are structurally normal, with no evidence of dilitation and the aortic arch was not well visualized. Venous: The inferior vena cava is normal in size with greater than 50% respiratory variability, suggesting right atrial pressure of 3 mmHg. IAS/Shunts: The atrial septum is grossly normal.  LEFT VENTRICLE PLAX 2D LVIDd:         4.90 cm  Diastology LVIDs:         3.20 cm  LV e' medial:    6.00 cm/s LV PW:  0.80 cm  LV E/e' medial:  12.5 LV IVS:        0.80 cm  LV e' lateral:   7.00 cm/s LVOT diam:      2.10 cm  LV E/e' lateral: 10.7 LV SV:         67 LV SV Index:   34 LVOT Area:     3.46 cm  RIGHT VENTRICLE RV S prime:     12.50 cm/s TAPSE (M-mode): 2.3 cm LEFT ATRIUM             Index       RIGHT ATRIUM           Index LA diam:        3.80 cm 1.94 cm/m  RA Area:     16.60 cm LA Vol (A2C):   27.9 ml 14.26 ml/m RA Volume:   39.60 ml  20.24 ml/m LA Vol (A4C):   34.2 ml 17.48 ml/m LA Biplane Vol: 32.1 ml 16.40 ml/m  AORTIC VALVE LVOT Vmax:   118.00 cm/s LVOT Vmean:  74.000 cm/s LVOT VTI:    0.192 m  AORTA Ao Root diam: 3.60 cm Ao Asc diam:  3.40 cm MV E velocity: 75.00 cm/s MV A velocity: 82.00 cm/s  SHUNTS MV E/A ratio:  0.91        Systemic VTI:  0.19 m                            Systemic Diam: 2.10 cm Buford Dresser MD Electronically signed by Buford Dresser MD Signature Date/Time: 11/15/2020/5:56:57 PM    Final       Active Problems:   GIB (gastrointestinal bleeding)     LOS: 1 day   Tye Savoy ,NP 11/16/2020, 9:03 AM   I have discussed the case with the APP, and that is the plan I formulated. I personally interviewed and examined the patient.  CC: Melena last week, hematochezia this week.  Vishaal is currently in alcohol withdrawal, and is stuporous right now after receiving sedation.  He is in wrist restraints, and his wife is at the bedside.  I spoke with her, and she also got Jeptha's sister on the phone.  As near as I can tell, sounds that his bleeding may have stopped, and his hemoglobin remained stable in the normal range.  He cannot give any helpful history or review of systems right now due to his altered mental status.  He is hemodynamically stable, and I reassured his wife that he still will need both upper endoscopy and colonoscopy prior to discharge, but that it must wait until he is through the alcohol withdrawal.  Daily CBC would be sufficient, we will continue to follow him.  Nelida Meuse III Office: [phone]

## 2020-11-17 ENCOUNTER — Encounter (HOSPITAL_COMMUNITY)
Admission: EM | Disposition: A | Discharge status: Home / Self Care | Payer: Self-pay | Source: Ambulatory Visit | Attending: Family Medicine

## 2020-11-17 DIAGNOSIS — I4891 Unspecified atrial fibrillation: Secondary | ICD-10-CM | POA: Diagnosis not present

## 2020-11-17 DIAGNOSIS — I739 Peripheral vascular disease, unspecified: Secondary | ICD-10-CM

## 2020-11-17 DIAGNOSIS — F10931 Alcohol use, unspecified with withdrawal delirium: Secondary | ICD-10-CM

## 2020-11-17 DIAGNOSIS — I480 Paroxysmal atrial fibrillation: Secondary | ICD-10-CM | POA: Diagnosis not present

## 2020-11-17 DIAGNOSIS — K219 Gastro-esophageal reflux disease without esophagitis: Secondary | ICD-10-CM

## 2020-11-17 DIAGNOSIS — F10231 Alcohol dependence with withdrawal delirium: Secondary | ICD-10-CM

## 2020-11-17 DIAGNOSIS — K922 Gastrointestinal hemorrhage, unspecified: Secondary | ICD-10-CM | POA: Diagnosis not present

## 2020-11-17 DIAGNOSIS — J449 Chronic obstructive pulmonary disease, unspecified: Secondary | ICD-10-CM

## 2020-11-17 DIAGNOSIS — I10 Essential (primary) hypertension: Secondary | ICD-10-CM | POA: Diagnosis not present

## 2020-11-17 DIAGNOSIS — E1142 Type 2 diabetes mellitus with diabetic polyneuropathy: Secondary | ICD-10-CM

## 2020-11-17 LAB — COMPREHENSIVE METABOLIC PANEL
ALT: 59 U/L — ABNORMAL HIGH (ref 0–44)
AST: 106 U/L — ABNORMAL HIGH (ref 15–41)
Albumin: 3.5 g/dL (ref 3.5–5.0)
Alkaline Phosphatase: 167 U/L — ABNORMAL HIGH (ref 38–126)
Anion gap: 12 (ref 5–15)
BUN: 6 mg/dL — ABNORMAL LOW (ref 8–23)
CO2: 26 mmol/L (ref 22–32)
Calcium: 9.2 mg/dL (ref 8.9–10.3)
Chloride: 104 mmol/L (ref 98–111)
Creatinine, Ser: 0.61 mg/dL (ref 0.61–1.24)
GFR, Estimated: >60 mL/min (ref >60)
Glucose, Bld: 84 mg/dL (ref 70–99)
Potassium: 3.2 mmol/L — ABNORMAL LOW (ref 3.5–5.1)
Sodium: 142 mmol/L (ref 135–145)
Total Bilirubin: 1.3 mg/dL — ABNORMAL HIGH (ref 0.3–1.2)
Total Protein: 7.1 g/dL (ref 6.5–8.1)

## 2020-11-17 LAB — HEPATITIS B SURFACE ANTIBODY, QUANTITATIVE: Hep B S AB Quant (Post): <3.1 m[IU]/mL — ABNORMAL LOW (ref >9.9)

## 2020-11-17 LAB — CBC
HCT: 38.2 % — ABNORMAL LOW (ref 39.0–52.0)
Hemoglobin: 13.8 g/dL (ref 13.0–17.0)
MCH: 37.9 pg — ABNORMAL HIGH (ref 26.0–34.0)
MCHC: 36.1 g/dL — ABNORMAL HIGH (ref 30.0–36.0)
MCV: 104.9 fL — ABNORMAL HIGH (ref 80.0–100.0)
Platelets: 92 10*3/uL — ABNORMAL LOW (ref 150–400)
RBC: 3.64 MIL/uL — ABNORMAL LOW (ref 4.22–5.81)
RDW: 12.5 % (ref 11.5–15.5)
WBC: 7.4 10*3/uL (ref 4.0–10.5)
nRBC: 0 % (ref 0.0–0.2)

## 2020-11-17 LAB — MAGNESIUM: Magnesium: 1.4 mg/dL — ABNORMAL LOW (ref 1.7–2.4)

## 2020-11-17 LAB — PHOSPHORUS: Phosphorus: 3.9 mg/dL (ref 2.5–4.6)

## 2020-11-17 LAB — GLUCOSE, CAPILLARY: Glucose-Capillary: 98 mg/dL (ref 70–99)

## 2020-11-17 SURGERY — ESOPHAGOGASTRODUODENOSCOPY (EGD) WITH PROPOFOL
Anesthesia: Monitor Anesthesia Care

## 2020-11-17 MED ORDER — POTASSIUM CHLORIDE 10 MEQ/100ML IV SOLN
10.0000 meq | INTRAVENOUS | Status: AC
  Administered 2020-11-17 – 2020-11-18 (×6): 10 meq via INTRAVENOUS
  Filled 2020-11-17 (×5): qty 100

## 2020-11-17 MED ORDER — LORAZEPAM 1 MG PO TABS
1.0000 mg | ORAL_TABLET | ORAL | Status: AC | PRN
  Administered 2020-11-18: 1 mg via ORAL
  Filled 2020-11-17: qty 1

## 2020-11-17 MED ORDER — LORAZEPAM 2 MG/ML IJ SOLN: 1.0000 mg | INTRAMUSCULAR | Status: AC | PRN

## 2020-11-17 MED ORDER — METOPROLOL TARTRATE 25 MG PO TABS: 25.0000 mg | ORAL_TABLET | Freq: Once | ORAL | Status: DC

## 2020-11-17 MED ORDER — METOPROLOL TARTRATE 25 MG PO TABS
25.0000 mg | ORAL_TABLET | Freq: Two times a day (BID) | ORAL | Status: DC
  Administered 2020-11-17 – 2020-11-22 (×10): 25 mg via ORAL
  Filled 2020-11-17 (×10): qty 1

## 2020-11-17 MED ORDER — METOPROLOL TARTRATE 5 MG/5ML IV SOLN: 5.0000 mg | INTRAVENOUS | Status: DC | PRN

## 2020-11-17 MED ORDER — METOPROLOL TARTRATE 50 MG PO TABS: 50.0000 mg | ORAL_TABLET | Freq: Two times a day (BID) | ORAL | Status: DC

## 2020-11-17 MED ORDER — MAGNESIUM OXIDE -MG SUPPLEMENT 400 (240 MG) MG PO TABS: 200.0000 mg | ORAL_TABLET | Freq: Every day | ORAL | Status: DC

## 2020-11-17 MED ORDER — MAGNESIUM SULFATE 2 GM/50ML IV SOLN
2.0000 g | Freq: Once | INTRAVENOUS | Status: DC
  Filled 2020-11-17: qty 50

## 2020-11-17 NOTE — Progress Notes (Signed)
PROGRESS NOTE    BRENTLEE DELAGE  XBM:[id number] DOB: [birth year] DOA: 11/14/2020 PCP: Jearld Fenton, NP    Brief Narrative:  [name] was admitted to the hospital with working diagnosis of upper GI bleed complicated by acute alcohol withdrawal syndrome.  65 year old male past medical history for COPD, lung cancer, coronary artery disease, GERD, duodenal ulcers and duodenitis who presented with dark stools for about 2 weeks.  Initially melena but then evolved into hematochezia.  Several bowel movements per day up to 20.  Heavy alcohol consumption daily.  On his initial physical examination blood pressure 112/73, heart rate 103, respiratory rate 18, temperature 98.2, oxygen saturation 98%, his lungs have mild wheezing, heart S1-S2, present, rhythmic, abdomen tender to palpation at the epigastrium, no rebound no guarding, no lower extremity edema.  Sodium 141, potassium 3.5, chloride 102, bicarb 24, glucose 109, BUN 10, creatinine 0.73, white count 5.9, hemoglobin 14.0, hematocrit 39.7, platelets 102. SARS COVID-19 negative.  Chest radiograph with hyperinflation, right upper lobe scarring.  Patient was placed on proton pump inhibitors and had frequent hemoglobin/hematocrit checks. His hospitalization was complicated by alcohol withdrawal syndrome and atrial fibrillation with RVR.      Assessment & Plan:   Principal Problem:   GIB (gastrointestinal bleeding) Active Problems:   Essential hypertension   Moderate COPD (chronic obstructive pulmonary disease) (HCC)   Peripheral vascular disease (HCC)   GERD (gastroesophageal reflux disease)   Type 2 diabetes mellitus with peripheral neuropathy (HCC)   Alcohol withdrawal delirium (HCC)   Atrial fibrillation with RVR (HCC)   Acute upper GI bleed. Hgb and hct have been stable, patient was not able to undergo EGD due to alcohol withdrawal symptoms.  Plan to continue supportive care with pantoprazole and close follow up of hgb and  hct.  Resume diet for now. Patient continue hemodynamically stable.   2. Acute alcohol withdrawal syndrome. Patient with agitation this am, rapid response called.  Soft restrain in bilateral wrists.   Plan to continue with lorazepam per CIWA protocol. Continue neuro checks per unit protocol and aspiration precautions.   3. Atrial fibrillation, paroxysmal. Likely due to alcohol withdrawal. Patient now back on sinus rhythm with pac, personally reviewed telemetry.  Plan to continue rate control, not candidate for anticoagulation due to fall risk in the setting of alcohol abuse.  Continue rate control with metoprolol 50 mg po bid Will add IV metoprolol in case patient not able to take po meds.   4. T2Dm/dyslipidemia. Continue glucose cover and monitoring with insulin sliding scale.   Continue with statin therapy.   5. Hypokalemia and hypomagensemia. Continue k and Mg correction, Kcl IV 60 meq and 2 g Mag sulfate Follow renal function and electrolytes in am, continue with supportive iV fluids.   7. HTN. Continue blood pressure control with losartan and metoprolol.   Patient continue to be at high risk for worsening withdrawal   Status is: Inpatient  Remains inpatient appropriate because:Inpatient level of care appropriate due to severity of illness  Dispo: The patient is from: Home              Anticipated d/c is to: Home              Patient currently is not medically stable to d/c.   Difficult to place patient No   DVT prophylaxis: Enoxaparin   Code Status:    full  Family Communication:  No family at the bedside      Consultants:  GI  Cardiology   Subjective: Patient is sedated at the time of my examination, but this am EGD was cancelled due to agitation   Objective: Vitals:   11/17/20 0100 11/17/20 0300 11/17/20 0849 11/17/20 1033  BP: (!) 167/97  (!) 175/93 132/78  Pulse: (!) 101 (!) 108 83 70  Resp:   18   Temp:   98.5 F (36.9 C)   TempSrc:   Oral   SpO2:       Weight:      Height:        Intake/Output Summary (Last 24 hours) at 11/17/2020 1558 Last data filed at 11/17/2020 1100 Gross per 24 hour  Intake 1903.12 ml  Output 2575 ml  Net -671.88 ml   Filed Weights   11/16/20 1348  Weight: 79.8 kg    Examination:   General: Not in pain or dyspnea, deconditioned  Neurology: sedated. Bilateral wrists.  E ENT: no pallor, no icterus, oral mucosa moist Cardiovascular: No JVD. S1-S2 present, rhythmic, no gallops, rubs, or murmurs. No lower extremity edema. Pulmonary: positive breath sounds bilaterally,with no wheezing, rhonchi or rales. Gastrointestinal. Abdomen soft and non tender Skin. No rashes Musculoskeletal: no joint deformities     Data Reviewed: I have personally reviewed following labs and imaging studies  CBC: Recent Labs  Lab 11/14/20 1115 11/14/20 1347 11/14/20 1717 11/14/20 2019 11/15/20 0500 11/15/20 1143 11/16/20 0809 11/17/20 1444  WBC 5.9  --  5.5  --   --   --  6.0 7.4  NEUTROABS 3.6  --   --   --   --   --   --   --   HGB 14.0   < > 12.8* 12.0* 12.0* 12.0* 13.8 13.8  HCT 39.7   < > 35.2* 33.8* 33.3* 33.3* 39.3 38.2*  MCV 104.7*  --  103.5*  --   --   --  105.6* 104.9*  PLT 102*  --  81*  --   --   --  71* 92*   < > = values in this interval not displayed.   Basic Metabolic Panel: Recent Labs  Lab 11/14/20 1115 11/14/20 1717 11/15/20 0500 11/15/20 1322 11/16/20 0809 11/17/20 1444  NA 141  --  140  --  140 142  K 3.5  --  3.3*  --  3.3* 3.2*  CL 102  --  102  --  103 104  CO2 24  --  28  --  26 26  GLUCOSE 109*  --  100*  --  103* 84  BUN 10  --  13  --  7* 6*  CREATININE 0.73  --  0.73  --  0.66 0.61  CALCIUM 8.7*  --  8.3*  --  8.9 9.2  MG  --  0.8*  --  2.6* 1.6* 1.4*  PHOS  --  2.5  --   --  2.4* 3.9   GFR: Estimated Creatinine Clearance: 93.3 mL/min (by C-G formula based on SCr of 0.61 mg/dL). Liver Function Tests: Recent Labs  Lab 11/14/20 1115 11/16/20 0809 11/17/20 1444  AST  221* 95* 106*  ALT 63* 53* 59*  ALKPHOS 169* 158* 167*  BILITOT 1.0 1.2 1.3*  PROT 6.6 6.9 7.1  ALBUMIN 3.5 3.5 3.5   Recent Labs  Lab 11/14/20 1115  LIPASE 92*   No results for input(s): AMMONIA in the last 168 hours. Coagulation Profile: Recent Labs  Lab 11/14/20 1115  INR 0.8   Cardiac Enzymes: No results for  input(s): CKTOTAL, CKMB, CKMBINDEX, TROPONINI in the last 168 hours. BNP (last 3 results) No results for input(s): PROBNP in the last 8760 hours. HbA1C: No results for input(s): HGBA1C in the last 72 hours. CBG: Recent Labs  Lab 11/15/20 1603 11/16/20 0751 11/16/20 1201 11/16/20 1210 11/16/20 1703  GLUCAP 129* 98 98 97 88   Lipid Profile: No results for input(s): CHOL, HDL, LDLCALC, TRIG, CHOLHDL, LDLDIRECT in the last 72 hours. Thyroid Function Tests: Recent Labs    11/15/20 1322  TSH 2.119   Anemia Panel: No results for input(s): VITAMINB12, FOLATE, FERRITIN, TIBC, IRON, RETICCTPCT in the last 72 hours.    Radiology Studies: I have reviewed all of the imaging during this hospital visit personally     Scheduled Meds:  atorvastatin  40 mg Oral Daily   fluticasone furoate-vilanterol  1 puff Inhalation Daily   And   umeclidinium bromide  1 puff Inhalation Daily   folic acid  1 mg Oral Daily   gabapentin  300 mg Oral TID   loratadine  10 mg Oral Daily   losartan  25 mg Oral Daily   magnesium oxide  200 mg Oral Daily   metoprolol tartrate  25 mg Oral Once   metoprolol tartrate  50 mg Oral BID   multivitamin with minerals  1 tablet Oral Daily   nicotine  14 mg Transdermal Daily   pantoprazole (PROTONIX) IV  40 mg Intravenous Q12H   thiamine  100 mg Oral Daily   Or   thiamine  100 mg Intravenous Daily   traZODone  50 mg Oral QHS   Continuous Infusions:  lactated ringers 75 mL/hr at 11/17/20 0921     LOS: 2 days        Dayvion Sans Gerome Apley, MD

## 2020-11-17 NOTE — Progress Notes (Signed)
Inpatient Rehab Admissions Coordinator:   Pt. With rapid response today, not medically ready for CIR. I will follow for potential admit pending medical readiness , insurance auth, and bed availability.   Clemens Catholic, [place], [place] Admissions Coordinator  [phone] ([place]) [phone] (office)

## 2020-11-17 NOTE — Significant Event (Signed)
Rapid Response Event Note   Reason for Call :  Consulted requested by bedside RN regarding CIWA management and Ativan dosing.   Initial Focused Assessment:  Patient with significant tremors, anxiety, restlessness, sweating, disoriented, hallucinating, thus meeting CIWA requirements for Ativan administration. (See CIWA documentation flowsheet and MAR.) Bilateral wrist restraints appropriately utilized, as patient grabbing at anything he can reach, and unable to follow safety instructions. Per [facility] documentation patient has been requiring 2-4 mg Ativan approximately every 90 minutes to 3 hours.   Interventions:  Reviewed chart and CIWA orders with on call hospitalist provider, Gershon Cull, NP and bedside RN. Agree with continued Ativan administration per ordered parameters. Bedside RN aware of signs/symptoms to monitor for oversedation or ineffectiveness of Ativan dosing.  IV site is patent and secured by staff.  Plan of Care:  At this time patient will remain on current level of care without alteration to current order set as staff is providing excellent care. If changes in condition, staff will alert rapid response.   Event Summary:   MD Notified: [id number] Call Time: 0149 Arrival Time: 0155 End Time: 0220  Selinda Michaels, RN

## 2020-11-17 NOTE — Progress Notes (Addendum)
Progress Note  Patient Name: Joshua [name] Date of Encounter: 11/17/2020  [facility] Cardiologist: Candee Furbish, MD   Subjective   Somnolent, rouses to verbal, responsive to touch. Cooperates w/ exam   Inpatient Medications    Scheduled Meds:  atorvastatin  40 mg Oral Daily   fluticasone furoate-vilanterol  1 puff Inhalation Daily   And   umeclidinium bromide  1 puff Inhalation Daily   folic acid  1 mg Oral Daily   gabapentin  300 mg Oral TID   loratadine  10 mg Oral Daily   losartan  25 mg Oral Daily   metoprolol tartrate  25 mg Oral BID   multivitamin with minerals  1 tablet Oral Daily   nicotine  14 mg Transdermal Daily   pantoprazole (PROTONIX) IV  40 mg Intravenous Q12H   thiamine  100 mg Oral Daily   Or   thiamine  100 mg Intravenous Daily   traZODone  50 mg Oral QHS   Continuous Infusions:  lactated ringers 75 mL/hr at 11/16/20 1806   PRN Meds: acetaminophen, albuterol, hydrALAZINE, LORazepam **OR** LORazepam, ondansetron **OR** ondansetron (ZOFRAN) IV, tiZANidine   Vital Signs    Vitals:   11/17/20 0000 11/17/20 0100 11/17/20 0300 11/17/20 0849  BP: (!) 170/96 (!) 167/97  (!) 175/93  Pulse: (!) 103 (!) 101 (!) 108 83  Resp:    18  Temp:    98.5 F (36.9 C)  TempSrc:    Oral  SpO2:      Weight:      Height:        Intake/Output Summary (Last 24 hours) at 11/17/2020 0854 Last data filed at 11/17/2020 0600 Gross per 24 hour  Intake 1903.12 ml  Output 1800 ml  Net 103.12 ml   Last 3 Weights 11/16/2020 11/14/2020 06/20/2020  Weight (lbs) 176 lb 176 lb 188 lb  Weight (kg) 79.833 kg 79.833 kg 85.276 kg      Telemetry    SR, ST - Personally Reviewed  ECG    None today - Personally Reviewed  Physical Exam   General: Well developed, well nourished, male sleepy, but in no acute distress Head: Eyes PERRLA, Head normocephalic and bruising noted R side of face Lungs: rales bases bilaterally to auscultation. Heart: HRRR S1 S2, without rub or  gallop. No murmur. 4/4 extremity pulses are 2+ & equal. No JVD. Abdomen: Bowel sounds are present, abdomen soft and non-tender without masses or  hernias noted. Msk: Normal strength and tone for age. Extremities: No clubbing, cyanosis or edema.    Skin:  No rashes or lesions noted. Neuro: somnolent, rouses to verbal, appropriate   Labs    High Sensitivity Troponin:  No results for input(s): TROPONINIHS in the last 720 hours.    Chemistry Recent Labs  Lab 11/14/20 1115 11/15/20 0500 11/16/20 0809  NA 141 140 140  K 3.5 3.3* 3.3*  CL 102 102 103  CO2 24 28 26   GLUCOSE 109* 100* 103*  BUN 10 13 7*  CREATININE 0.73 0.73 0.66  CALCIUM 8.7* 8.3* 8.9  PROT 6.6  --  6.9  ALBUMIN 3.5  --  3.5  AST 221*  --  95*  ALT 63*  --  53*  ALKPHOS 169*  --  158*  BILITOT 1.0  --  1.2  GFRNONAA >60 >60 >60  ANIONGAP 15 10 11     Magnesium  Date Value Ref Range Status  11/16/2020 1.6 (L) 1.7 - 2.4 mg/dL Final  Comment:    Performed at [facility], [place] [address], [place], [place] [address]  11/15/2020 2.6 (H) 1.7 - 2.4 mg/dL Final    Comment:    Performed at [facility], [place] [address] [place], [place] [address]  11/14/2020 0.8 (LL) 1.7 - 2.4 mg/dL Final    Comment:    CRITICAL RESULT CALLED TO, READ BACK BY AND VERIFIED WITH: Roda Shutters AT 1812 ON 11/14/2020 BY P,LUZOLO Performed at [place] [address] [place], [place] [address]      Hematology Recent Labs  Lab 11/14/20 1115 11/14/20 1347 11/14/20 1717 11/14/20 2019 11/15/20 0500 11/15/20 1143 11/16/20 0809  WBC 5.9  --  5.5  --   --   --  6.0  RBC 3.79*  --  3.40*  --   --   --  3.72*  HGB 14.0   < > 12.8*   < > 12.0* 12.0* 13.8  HCT 39.7   < > 35.2*   < > 33.3* 33.3* 39.3  MCV 104.7*  --  103.5*  --   --   --  105.6*  MCH 36.9*  --  37.6*  --   --   --  37.1*  MCHC 35.3  --  36.4*  --   --   --  35.1  RDW 13.0  --  12.9  --   --   --  12.7  PLT  102*  --  81*  --   --   --  71*   < > = values in this interval not displayed.   Lab Results  Component Value Date   TSH 2.119 11/15/2020   Lab Results  Component Value Date   HGBA1C 5.3 07/09/2019   Lab Results  Component Value Date   CHOL 192 07/09/2019   HDL 101 07/09/2019   LDLCALC 75 07/09/2019   LDLDIRECT 107.0 04/21/2015   TRIG 79 07/09/2019   CHOLHDL 1.9 07/09/2019   Lab Results  Component Value Date   TSH 2.119 11/15/2020   Lab Results  Component Value Date   HGBA1C 5.3 07/09/2019   Lab Results  Component Value Date   CHOL 192 07/09/2019   HDL 101 07/09/2019   LDLCALC 75 07/09/2019   LDLDIRECT 107.0 04/21/2015   TRIG 79 07/09/2019   CHOLHDL 1.9 07/09/2019    BNPNo results for input(s): BNP, PROBNP in the last 168 hours.   DDimer No results for input(s): DDIMER in the last 168 hours.   Radiology    DG Tibia/Fibula Right  Result Date: 11/15/2020 CLINICAL DATA:  Fall with unresolved like pain EXAM: RIGHT TIBIA AND FIBULA - 2 VIEW COMPARISON:  None. FINDINGS: There is no evidence of fracture or other focal bone lesions. Soft tissues are unremarkable. Vascular calcification IMPRESSION: Negative. Electronically Signed   By: Donavan Foil M.D.   On: 11/15/2020 20:01   ECHOCARDIOGRAM COMPLETE  Result Date: 11/15/2020    ECHOCARDIOGRAM REPORT   Patient Name:   ANDERSSON LARRABEE Fitzpatrick Date of Exam: 11/15/2020 Medical Rec #:  [id number]          Height:       69.0 in Accession #:    [id number]         Weight:       176.0 lb Date of Birth:  Jun 28, 1955          BSA:          1.957 m Patient Age:  65 years           BP:           159/80 mmHg Patient Gender: M                  HR:           95 bpm. Exam Location:  Inpatient Procedure: 2D Echo, Color Doppler and Cardiac Doppler Indications:    I48.91* Unspecified atrial fibrillation  History:        Patient has no prior history of Echocardiogram examinations.                 COPD; Risk Factors:Hypertension, Diabetes and  Dyslipidemia.  Sonographer:    Raquel Sarna Senior RDCS Referring Phys: [id number] [place]  1. Left ventricular ejection fraction, by estimation, is 60 to 65%. The left ventricle has normal function. The left ventricle has no regional wall motion abnormalities. Left ventricular diastolic parameters were normal.  2. Right ventricular systolic function is normal. The right ventricular size is normal. Tricuspid regurgitation signal is inadequate for assessing PA pressure.  3. The mitral valve is grossly normal. Trivial mitral valve regurgitation. No evidence of mitral stenosis.  4. The aortic valve was not well visualized. Aortic valve regurgitation is not visualized. No aortic stenosis is present.  5. The inferior vena cava is normal in size with greater than 50% respiratory variability, suggesting right atrial pressure of 3 mmHg. Comparison(s): No prior Echocardiogram. Conclusion(s)/Recommendation(s): Normal biventricular function without evidence of hemodynamically significant valvular heart disease. FINDINGS  Left Ventricle: Left ventricular ejection fraction, by estimation, is 60 to 65%. The left ventricle has normal function. The left ventricle has no regional wall motion abnormalities. The left ventricular internal cavity size was normal in size. There is  no left ventricular hypertrophy. Left ventricular diastolic parameters were normal. Right Ventricle: The right ventricular size is normal. Right vetricular wall thickness was not well visualized. Right ventricular systolic function is normal. Tricuspid regurgitation signal is inadequate for assessing PA pressure. Left Atrium: Left atrial size was normal in size. Right Atrium: Right atrial size was normal in size. Pericardium: There is no evidence of pericardial effusion. Presence of pericardial fat pad. Mitral Valve: The mitral valve is grossly normal. Trivial mitral valve regurgitation. No evidence of mitral valve stenosis. Tricuspid Valve: The  tricuspid valve is grossly normal. Tricuspid valve regurgitation is not demonstrated. No evidence of tricuspid stenosis. Aortic Valve: The aortic valve was not well visualized. Aortic valve regurgitation is not visualized. No aortic stenosis is present. Pulmonic Valve: The pulmonic valve was not well visualized. Pulmonic valve regurgitation is not visualized. No evidence of pulmonic stenosis. Aorta: The aortic root and ascending aorta are structurally normal, with no evidence of dilitation and the aortic arch was not well visualized. Venous: The inferior vena cava is normal in size with greater than 50% respiratory variability, suggesting right atrial pressure of 3 mmHg. IAS/Shunts: The atrial septum is grossly normal.  LEFT VENTRICLE PLAX 2D LVIDd:         4.90 cm  Diastology LVIDs:         3.20 cm  LV e' medial:    6.00 cm/s LV PW:         0.80 cm  LV E/e' medial:  12.5 LV IVS:        0.80 cm  LV e' lateral:   7.00 cm/s LVOT diam:     2.10 cm  LV E/e' lateral: 10.7 LV SV:  67 LV SV Index:   34 LVOT Area:     3.46 cm  RIGHT VENTRICLE RV S prime:     12.50 cm/s TAPSE (M-mode): 2.3 cm LEFT ATRIUM             Index       RIGHT ATRIUM           Index LA diam:        3.80 cm 1.94 cm/m  RA Area:     16.60 cm LA Vol (A2C):   27.9 ml 14.26 ml/m RA Volume:   39.60 ml  20.24 ml/m LA Vol (A4C):   34.2 ml 17.48 ml/m LA Biplane Vol: 32.1 ml 16.40 ml/m  AORTIC VALVE LVOT Vmax:   118.00 cm/s LVOT Vmean:  74.000 cm/s LVOT VTI:    0.192 m  AORTA Ao Root diam: 3.60 cm Ao Asc diam:  3.40 cm MV E velocity: 75.00 cm/s MV A velocity: 82.00 cm/s  SHUNTS MV E/A ratio:  0.91        Systemic VTI:  0.19 m                            Systemic Diam: 2.10 cm Buford Dresser MD Electronically signed by Buford Dresser MD Signature Date/Time: 11/15/2020/5:56:57 PM    Final     Cardiac Studies   ECHO: 11/15/2020  1. Left ventricular ejection fraction, by estimation, is 60 to 65%. The  left ventricle has normal  function. The left ventricle has no regional  wall motion abnormalities. Left ventricular diastolic parameters were  normal.   2. Right ventricular systolic function is normal. The right ventricular  size is normal. Tricuspid regurgitation signal is inadequate for assessing PA pressure.   3. The mitral valve is grossly normal. Trivial mitral valve  regurgitation. No evidence of mitral stenosis.   4. The aortic valve was not well visualized. Aortic valve regurgitation is not visualized. No aortic stenosis is present.   5. The inferior vena cava is normal in size with greater than 50%  respiratory variability, suggesting right atrial pressure of 3 mmHg.   Comparison(s): No prior Echocardiogram.   Conclusion(s)/Recommendation(s): Normal biventricular function without evidence of hemodynamically significant valvular heart disease.    Patient Profile     65 y.o. male  with a history of CAD s/p rotational atherectomy and stenting with DES x2 to tandem proximal and mid LAD lesions in 05/2015, PAD s/p aortobifemoral bypass in 2010 at [facility] and then orbital atherectomy and drug-coated balloon angioplasty to the ostial/proximal right SFA in 12/2017, mild bilateral carotid stenosis (1-39%) on dopplers in 11/2017, COPD, HTN, HLD, DM2 with diabetic neuropathy, GERD, and polysubstance abuse (tobacco and alcohol), who was admitted 08/31 with black diarrhea, nausea, abdominal pain. Dx UGIB, went into rapid Afib >> Cards consult.   Assessment & Plan    New Onset Atrial Fibrillation - no recurrence since initial episode, duration about 4 hr - spont conversion to SR - in setting of ETOH abuse hx and UGIB - hx DVT/PE s/p IVC filter - CHA2DS2-VASc =  5, but risks > benefits of anticoagulation - nl EF on echo w/ no WMA  CAD S/p DES x2 to LAD in 2017.  - on statin, ASA restarted   PAD S/p aortobifemoral bypass in 2010 at [facility] and then orbital atherectomy and drug-coated balloon angioplasty to the  ostial/proximal right SFA in 12/2017. - no sx, f/u as outpt   Hypertension -  SBP range 119-180 last 24 hr - BB increased, on losartan 25 mg qd - follow   Upper GI Bleed History of Duodenitis/Duodenal Ulcer - GI has seen, rec bid PPI - procedures planned once pt condition improves   Alcohol Abuse - some WD sx noted - per IM   Otherwise, per primary team: - COPD, - Elevated LFTs: previously felt to be due to alcohol use/fatty lver  - Arthritis   [facility] will sign off.   Medication Recommendations:  Continue metoprolol.  Not a good anticoagulation candidate given GI bleed, frequent falls Other recommendations (labs, testing, etc):  None Follow up as an outpatient: We will schedule follow-up   For questions or updates, please contact [facility] Please consult [email or web address] for contact info under    Signed, Rosaria Ferries, PA-C  11/17/2020, 8:54 AM    Patient seen and examined.  Agree with above documentation.  On exam, patient is very somnolent, not answering questions, regular rate and rhythm, clear to auscultation bilaterally in anterior fields, no lower extremity edema.  He is currently going through alcohol withdrawal.  Telemetry shows no recurrence of A. fib.  He is not a good anticoagulation candidate given his GI bleed and frequent falls.  Would continue metoprolol.  We will schedule outpatient follow-up.  Donato Heinz, MD

## 2020-11-17 NOTE — Progress Notes (Signed)
PT Cancellation Note  Patient Details Name: Joshua [name] MRN: [id number] DOB: Sep 21, 1955   Cancelled Treatment:    Reason Eval/Treat Not Completed: Medical issues which prohibited therapy (withdrawal, receiving Ativan)   Maizey Menendez,KATHrine E 11/17/2020, 10:37 AM Arlyce Dice, DPT Acute Rehabilitation Services Pager: [phone] Office: [phone]

## 2020-11-17 NOTE — Progress Notes (Signed)
     Patient have Etoh withdrawals. Rapid Response called around 3 am for tremors / anxiety, hallucinations, confusion. He is still confused, speech garbled. Wife at bedside. Tachycardic earlier this am (resolved). Still hypertensive.  Will check back on patient tomorrow. Plan is for EGD / colonoscopy when medically stable.

## 2020-11-18 DIAGNOSIS — K922 Gastrointestinal hemorrhage, unspecified: Secondary | ICD-10-CM | POA: Diagnosis not present

## 2020-11-18 LAB — BASIC METABOLIC PANEL
Anion gap: 11 (ref 5–15)
BUN: 7 mg/dL — ABNORMAL LOW (ref 8–23)
CO2: 23 mmol/L (ref 22–32)
Calcium: 8.9 mg/dL (ref 8.9–10.3)
Chloride: 106 mmol/L (ref 98–111)
Creatinine, Ser: 0.49 mg/dL — ABNORMAL LOW (ref 0.61–1.24)
GFR, Estimated: >60 mL/min (ref >60)
Glucose, Bld: 83 mg/dL (ref 70–99)
Potassium: 3.6 mmol/L (ref 3.5–5.1)
Sodium: 140 mmol/L (ref 135–145)

## 2020-11-18 LAB — MAGNESIUM: Magnesium: 1.3 mg/dL — ABNORMAL LOW (ref 1.7–2.4)

## 2020-11-18 MED ORDER — MAGNESIUM SULFATE 2 GM/50ML IV SOLN
2.0000 g | Freq: Once | INTRAVENOUS | Status: AC
  Administered 2020-11-18: 2 g via INTRAVENOUS
  Filled 2020-11-18: qty 50

## 2020-11-18 NOTE — Progress Notes (Signed)
PROGRESS NOTE    Joshua [name]  BLT:[id number] DOB: Sep 09, 1955 DOA: 11/14/2020 PCP: Jearld Fenton, NP   Brief Narrative:  This 65 years old male with history of COPD, lung cancer, CAD s/p PTCA 2017 who has not been taking Plavix but is still on aspirin due to cost issues, GERD, duodenal ulcer, duodenitis in 2018, noncompliant with PPI presented to the to the GI clinic with complaints of dyspepsia, nausea, loose dark stools for 2 weeks associated with some abdominal discomfort.  He had a history of hemorrhoid for which he underwent surgery in 1990 and stated he was doing pretty good without hematochezia until recently.  He denies any hematemesis.  Patient does report heavy alcohol intake drinks 1 vodka every day. Patient is admitted for GI bleed,  GI is consulted recommended continue PPI twice daily,  Patient will eventually need EGD and colonoscopy.  Hospital course was complicated by new onset A. fib and alcohol withdrawal.  Cardiology consulted spontaneously converted into normal sinus rhythm, not a candidate for anticoagulation given acute GI bleed and recurrent falls.  Patient had active alcohol withdrawal yesterday EGD and colonoscopy is on hold until withdrawal symptoms resolved.   Assessment & Plan:   Principal Problem:   GIB (gastrointestinal bleeding) Active Problems:   Essential hypertension   Moderate COPD (chronic obstructive pulmonary disease) (HCC)   Peripheral vascular disease (HCC)   GERD (gastroesophageal reflux disease)   Type 2 diabetes mellitus with peripheral neuropathy (HCC)   Alcohol withdrawal delirium (HCC)   Atrial fibrillation with RVR (HCC)  Upper GI bleed: Patient presented with dark-colored stools. Patient does report prior history of duodenitis, duodenal ulcer and has been noncompliant with PPI. He continues to drink alcohol heavily.  Differentials include alcoholic gastritis versus recurrent ulcer disease. Continue pantoprazole 40 mg IV twice  daily. Hemoglobin remained stable, continue to monitor H&H. GI consulted.  EGD and colonoscopy is on hold until withdrawal symptoms resolves.  CAD:  Patient has not been taking Plavix due to the financial barriers. Last PTCA 2018, hold aspirin due to GI bleed.  Denies any chest pain.  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  Continue Neurontin. Wife reported patient did better with the Lyrica but could not afford it anymore. PT and OT evaluation.  Elevated liver enzymes: This could be due to EtOH abuse Continue to trend liver enzymes.  COPD:  Not in any acute exacerbation,  continue home inhalers.  Hypertension:  continue home medications.  Diabetes melitis: Continue regular insulin sliding scale, continue Neurontin.  ETOH Withdrawal Patient has been drinking alcohol heavily,  has developed withdrawal symptoms yesterday.   Continue CIWA protocol.  Patient was restless and agitated requiring soft restraints. He is much improved today,  alert and oriented, restraints removed.   New onset  atrial fibrillation with RVR: Heart rate is now controlled, converted into sinus rhythm spontaneously Patient has CHADS Vasc of 5, he is not a candidate for anticoagulation due to acute GI bleed and recurrent falls. Continue metoprolol 25 mg twice daily.   DVT prophylaxis: SCDs Code Status: Full code. Family Communication: Wife at bedside Disposition Plan:   Status is: Inpatient  Remains inpatient appropriate because:Inpatient level of care appropriate due to severity of illness  Dispo: The patient is from: Home              Anticipated d/c is to: Home              Patient currently is not  medically stable to d/c.   Difficult to place patient No  Consultants:  GI Cardiology  Procedures:  Antimicrobials:  Anti-infectives (From admission, onward)    None        Subjective: Patient was seen and examined at bedside.  Overnight events noted. Patient  was agitated and restless requiring soft restraints yesterday.   He was given Ativan as per CIWA protocol. He is much alert , oriented following commands,  soft restraints removed.    Objective: Vitals:   11/17/20 1600 11/18/20 0400 11/18/20 0737 11/18/20 0800  BP: (!) 167/88 (!) 157/92  (!) 178/90  Pulse:      Resp: (!) 26 (!) 25    Temp:      TempSrc:      SpO2:   93%   Weight:      Height:        Intake/Output Summary (Last 24 hours) at 11/18/2020 1156 Last data filed at 11/18/2020 0600 Gross per 24 hour  Intake 1280.22 ml  Output 1950 ml  Net -669.78 ml   Filed Weights   11/16/20 1348  Weight: 79.8 kg    Examination:  General exam: Appears comfortable, deconditioned, not in any acute distress. Respiratory system: Clear to auscultation. Respiratory effort normal. Cardiovascular system: S1-S2 heard, rhythm regular, no murmur,. Gastrointestinal system: Abdomen is soft, nontender, nondistended, BS + Central nervous system: Alert and oriented x 3.  No focal neurological deficits. Extremities: No edema, no cyanosis, no clubbing. Skin: No rashes, lesions or ulcers Psychiatry: Judgement and insight appear normal. Mood & affect appropriate.     Data Reviewed: I have personally reviewed following labs and imaging studies  CBC: Recent Labs  Lab 11/14/20 1115 11/14/20 1347 11/14/20 1717 11/14/20 2019 11/15/20 0500 11/15/20 1143 11/16/20 0809 11/17/20 1444  WBC 5.9  --  5.5  --   --   --  6.0 7.4  NEUTROABS 3.6  --   --   --   --   --   --   --   HGB 14.0   < > 12.8* 12.0* 12.0* 12.0* 13.8 13.8  HCT 39.7   < > 35.2* 33.8* 33.3* 33.3* 39.3 38.2*  MCV 104.7*  --  103.5*  --   --   --  105.6* 104.9*  PLT 102*  --  81*  --   --   --  71* 92*   < > = values in this interval not displayed.   Basic Metabolic Panel: Recent Labs  Lab 11/14/20 1115 11/14/20 1717 11/15/20 0500 11/15/20 1322 11/16/20 0809 11/17/20 1444 11/18/20 0520  NA 141  --  140  --  140 142 140   K 3.5  --  3.3*  --  3.3* 3.2* 3.6  CL 102  --  102  --  103 104 106  CO2 24  --  28  --  26 26 23   GLUCOSE 109*  --  100*  --  103* 84 83  BUN 10  --  13  --  7* 6* 7*  CREATININE 0.73  --  0.73  --  0.66 0.61 0.49*  CALCIUM 8.7*  --  8.3*  --  8.9 9.2 8.9  MG  --  0.8*  --  2.6* 1.6* 1.4* 1.3*  PHOS  --  2.5  --   --  2.4* 3.9  --    GFR: Estimated Creatinine Clearance: 93.3 mL/min (A) (by C-G formula based on SCr of 0.49 mg/dL (L)). Liver Function  Tests: Recent Labs  Lab 11/14/20 1115 11/16/20 0809 11/17/20 1444  AST 221* 95* 106*  ALT 63* 53* 59*  ALKPHOS 169* 158* 167*  BILITOT 1.0 1.2 1.3*  PROT 6.6 6.9 7.1  ALBUMIN 3.5 3.5 3.5   Recent Labs  Lab 11/14/20 1115  LIPASE 92*   No results for input(s): AMMONIA in the last 168 hours. Coagulation Profile: Recent Labs  Lab 11/14/20 1115  INR 0.8   Cardiac Enzymes: No results for input(s): CKTOTAL, CKMB, CKMBINDEX, TROPONINI in the last 168 hours. BNP (last 3 results) No results for input(s): PROBNP in the last 8760 hours. HbA1C: No results for input(s): HGBA1C in the last 72 hours. CBG: Recent Labs  Lab 11/15/20 1603 11/16/20 0751 11/16/20 1201 11/16/20 1210 11/16/20 1703  GLUCAP 129* 98 98 97 88   Lipid Profile: No results for input(s): CHOL, HDL, LDLCALC, TRIG, CHOLHDL, LDLDIRECT in the last 72 hours. Thyroid Function Tests: Recent Labs    11/15/20 1322  TSH 2.119   Anemia Panel: No results for input(s): VITAMINB12, FOLATE, FERRITIN, TIBC, IRON, RETICCTPCT in the last 72 hours. Sepsis Labs: No results for input(s): PROCALCITON, LATICACIDVEN in the last 168 hours.  Recent Results (from the past 240 hour(s))  Resp Panel by RT-PCR (Flu A&B, Covid) Nasopharyngeal Swab     Status: None   Collection Time: 11/14/20 11:30 AM   Specimen: Nasopharyngeal Swab; Nasopharyngeal(NP) swabs in vial transport medium  Result Value Ref Range Status   SARS Coronavirus 2 by RT PCR NEGATIVE NEGATIVE Final     Comment: (NOTE) SARS-CoV-2 target nucleic acids are NOT DETECTED.  The SARS-CoV-2 RNA is generally detectable in upper respiratory specimens during the acute phase of infection. The lowest concentration of SARS-CoV-2 viral copies this assay can detect is 138 copies/mL. A negative result does not preclude SARS-Cov-2 infection and should not be used as the sole basis for treatment or other patient management decisions. A negative result may occur with  improper specimen collection/handling, submission of specimen other than nasopharyngeal swab, presence of viral mutation(s) within the areas targeted by this assay, and inadequate number of viral copies(<138 copies/mL). A negative result must be combined with clinical observations, patient history, and epidemiological information. The expected result is Negative.  Fact Sheet for Patients:  EntrepreneurPulse.com.au  Fact Sheet for Healthcare Providers:  IncredibleEmployment.be  This test is no t yet approved or cleared by the [place] FDA and  has been authorized for detection and/or diagnosis of SARS-CoV-2 by FDA under an Emergency Use Authorization (EUA). This EUA will remain  in effect (meaning this test can be used) for the duration of the COVID-19 declaration under Section 564(b)(1) of the Act, 21 U.S.C.section 360bbb-3(b)(1), unless the authorization is terminated  or revoked sooner.       Influenza A by PCR NEGATIVE NEGATIVE Final   Influenza B by PCR NEGATIVE NEGATIVE Final    Comment: (NOTE) The Xpert Xpress SARS-CoV-2/FLU/RSV plus assay is intended as an aid in the diagnosis of influenza from Nasopharyngeal swab specimens and should not be used as a sole basis for treatment. Nasal washings and aspirates are unacceptable for Xpert Xpress SARS-CoV-2/FLU/RSV testing.  Fact Sheet for Patients: EntrepreneurPulse.com.au  Fact Sheet for Healthcare  Providers: IncredibleEmployment.be  This test is not yet approved or cleared by the [place] FDA and has been authorized for detection and/or diagnosis of SARS-CoV-2 by FDA under an Emergency Use Authorization (EUA). This EUA will remain in effect (meaning this test can be used) for the  duration of the COVID-19 declaration under Section 564(b)(1) of the Act, 21 U.S.C. section 360bbb-3(b)(1), unless the authorization is terminated or revoked.  Performed at [facility], [place] [address], [place], [place] [address]     Radiology Studies: No results found.  Scheduled Meds:  atorvastatin  40 mg Oral Daily   fluticasone furoate-vilanterol  1 puff Inhalation Daily   And   umeclidinium bromide  1 puff Inhalation Daily   folic acid  1 mg Oral Daily   gabapentin  300 mg Oral TID   loratadine  10 mg Oral Daily   losartan  25 mg Oral Daily   metoprolol tartrate  25 mg Oral Once   metoprolol tartrate  25 mg Oral BID   multivitamin with minerals  1 tablet Oral Daily   nicotine  14 mg Transdermal Daily   pantoprazole (PROTONIX) IV  40 mg Intravenous Q12H   thiamine  100 mg Oral Daily   Or   thiamine  100 mg Intravenous Daily   traZODone  50 mg Oral QHS   Continuous Infusions:  lactated ringers 75 mL/hr at 11/18/20 0132   magnesium sulfate bolus IVPB       LOS: 3 days    Time spent: 25 mins    Zaina Jenkin, MD [facility]   If 7PM-7AM, please contact night-coverage

## 2020-11-18 NOTE — Progress Notes (Signed)
Discontinued patient restraints. Patient is alert and oriented X4. Patient wife is at bedside.

## 2020-11-19 DIAGNOSIS — F10231 Alcohol dependence with withdrawal delirium: Secondary | ICD-10-CM

## 2020-11-19 DIAGNOSIS — K922 Gastrointestinal hemorrhage, unspecified: Secondary | ICD-10-CM | POA: Diagnosis not present

## 2020-11-19 LAB — COMPREHENSIVE METABOLIC PANEL
ALT: 39 U/L (ref 0–44)
AST: 50 U/L — ABNORMAL HIGH (ref 15–41)
Albumin: 2.8 g/dL — ABNORMAL LOW (ref 3.5–5.0)
Alkaline Phosphatase: 132 U/L — ABNORMAL HIGH (ref 38–126)
Anion gap: 8 (ref 5–15)
BUN: 8 mg/dL (ref 8–23)
CO2: 23 mmol/L (ref 22–32)
Calcium: 8.6 mg/dL — ABNORMAL LOW (ref 8.9–10.3)
Chloride: 106 mmol/L (ref 98–111)
Creatinine, Ser: 0.65 mg/dL (ref 0.61–1.24)
GFR, Estimated: >60 mL/min (ref >60)
Glucose, Bld: 100 mg/dL — ABNORMAL HIGH (ref 70–99)
Potassium: 3 mmol/L — ABNORMAL LOW (ref 3.5–5.1)
Sodium: 137 mmol/L (ref 135–145)
Total Bilirubin: 0.7 mg/dL (ref 0.3–1.2)
Total Protein: 5.8 g/dL — ABNORMAL LOW (ref 6.5–8.1)

## 2020-11-19 LAB — MAGNESIUM: Magnesium: 1.5 mg/dL — ABNORMAL LOW (ref 1.7–2.4)

## 2020-11-19 LAB — HEMOGLOBIN AND HEMATOCRIT, BLOOD
HCT: 35.4 % — ABNORMAL LOW (ref 39.0–52.0)
Hemoglobin: 12.8 g/dL — ABNORMAL LOW (ref 13.0–17.0)

## 2020-11-19 LAB — PHOSPHORUS: Phosphorus: 3.8 mg/dL (ref 2.5–4.6)

## 2020-11-19 MED ORDER — MAGNESIUM SULFATE 2 GM/50ML IV SOLN
2.0000 g | Freq: Once | INTRAVENOUS | Status: AC
  Administered 2020-11-19: 2 g via INTRAVENOUS
  Filled 2020-11-19: qty 50

## 2020-11-19 MED ORDER — POTASSIUM CHLORIDE 20 MEQ PO PACK
40.0000 meq | PACK | Freq: Once | ORAL | Status: AC
  Administered 2020-11-19: 40 meq via ORAL
  Filled 2020-11-19: qty 2

## 2020-11-19 MED ORDER — PANTOPRAZOLE SODIUM 40 MG PO TBEC
40.0000 mg | DELAYED_RELEASE_TABLET | Freq: Two times a day (BID) | ORAL | Status: DC
  Administered 2020-11-19 – 2020-11-22 (×6): 40 mg via ORAL
  Filled 2020-11-19 (×7): qty 1

## 2020-11-19 MED ORDER — PEG-KCL-NACL-NASULF-NA ASC-C 100 G PO SOLR
0.5000 | Freq: Once | ORAL | Status: AC
  Administered 2020-11-20: 100 g via ORAL
  Filled 2020-11-19: qty 1

## 2020-11-19 MED ORDER — PEG-KCL-NACL-NASULF-NA ASC-C 100 G PO SOLR: 1.0000 | Freq: Two times a day (BID) | ORAL | Status: DC

## 2020-11-19 MED ORDER — PEG-KCL-NACL-NASULF-NA ASC-C 100 G PO SOLR
0.5000 | Freq: Once | ORAL | Status: AC
  Administered 2020-11-21: 100 g via ORAL

## 2020-11-19 NOTE — Plan of Care (Signed)
  Problem: Education: Goal: Knowledge of General Education information will improve Description: Including pain rating scale, medication(s)/side effects and non-pharmacologic comfort measures Outcome: Progressing   Problem: Activity: Goal: Risk for activity intolerance will decrease Outcome: Progressing   Problem: Nutrition: Goal: Adequate nutrition will be maintained Outcome: Progressing   Problem: Coping: Goal: Level of anxiety will decrease Outcome: Progressing   

## 2020-11-19 NOTE — Progress Notes (Addendum)
[facility] Progress Note    Since last GI note: No overt bleeding since he was admitted. No BMs, no hematemesis.  DTs improved in past 24-48 hours.   Objective: Vital signs in last 24 hours: Temp:  [98 F (36.7 C)] 98 F (36.7 C) (09/04 0456) Pulse Rate:  [78-84] 78 (09/04 0456) Resp:  [16-20] 20 (09/04 0456) BP: (152-167)/(67-94) 152/67 (09/04 0456) SpO2:  [94 %-97 %] 97 % (09/04 0754) Last BM Date: 11/15/20 General: alert and oriented times 3 Heart: regular rate and rythm Abdomen: soft, non-tender, non-distended, normal bowel sounds  Lab Results: Recent Labs    11/17/20 1444 11/19/20 0429  WBC 7.4  --   HGB 13.8 12.8*  PLT 92*  --   MCV 104.9*  --    Recent Labs    11/17/20 1444 11/18/20 0520 11/19/20 0429  NA 142 140 137  K 3.2* 3.6 3.0*  CL 104 106 106  CO2 26 23 23   GLUCOSE 84 83 100*  BUN 6* 7* 8  CREATININE 0.61 0.49* 0.65  CALCIUM 9.2 8.9 8.6*   Recent Labs    11/17/20 1444 11/19/20 0429  PROT 7.1 5.8*  ALBUMIN 3.5 2.8*  AST 106* 50*  ALT 59* 39  ALKPHOS 167* 132*  BILITOT 1.3* 0.7    Medications: Scheduled Meds:  atorvastatin  40 mg Oral Daily   fluticasone furoate-vilanterol  1 puff Inhalation Daily   And   umeclidinium bromide  1 puff Inhalation Daily   folic acid  1 mg Oral Daily   gabapentin  300 mg Oral TID   loratadine  10 mg Oral Daily   losartan  25 mg Oral Daily   metoprolol tartrate  25 mg Oral Once   metoprolol tartrate  25 mg Oral BID   multivitamin with minerals  1 tablet Oral Daily   nicotine  14 mg Transdermal Daily   pantoprazole (PROTONIX) IV  40 mg Intravenous Q12H   potassium chloride  40 mEq Oral Once   thiamine  100 mg Oral Daily   Or   thiamine  100 mg Intravenous Daily   traZODone  50 mg Oral QHS   Continuous Infusions:  lactated ringers 75 mL/hr at 11/19/20 0000   magnesium sulfate bolus IVPB     magnesium sulfate bolus IVPB 2 g (11/19/20 0824)   PRN Meds:.acetaminophen, albuterol,  hydrALAZINE, LORazepam **OR** LORazepam, metoprolol tartrate, ondansetron **OR** ondansetron (ZOFRAN) IV, tiZANidine    Assessment/Plan: 65 y.o. male with alcoholism, two weeks of nausea, vomiting, loose dark stools and overt hematochezia  Not obviously cirrhotic based on US/CT, his INR and T bili were normal when admitted 5 days ago. Plts a bit low however 70-100.  Hep B/C negative, total Hep A Ab was positive.  Overall labs support acute etoh hepatitis.  DTs have improved on CIWA, he is eating well, has been HD stable, Hb nearly normal.  He needs colonsocopy and EGD this admission for his admitting symptoms, previous H. Pylori + PUD (untreated per patient), need for blood thinners.  I was told this morning that cases for tomorrow (Labor Day) are to be limited for emergencies only given anesthesia and endo staff skeleton crews and so we will aim for prepping him tomorrow for EGD and colonoscopy on Tuesday with Dr. Silverio Decamp.    I ordered prep to start tomorrow night, adjust diet appropriately and entered patient in [facility], will change to PO PPI rather than IV BID>  Milus Banister, MD  11/19/2020,  8:51 AM [facility] Pager [phone]

## 2020-11-19 NOTE — Progress Notes (Signed)
PROGRESS NOTE    Joshua [name]  XLK:[id number] DOB: 04-Apr-1955 DOA: 11/14/2020 PCP: Jearld Fenton, NP   Brief Narrative:  This 65 years old male with history of COPD, lung cancer, CAD s/p PTCA 2017 who has not been taking Plavix but is still on aspirin due to cost issues, GERD, duodenal ulcer, duodenitis in 2018, noncompliant with PPI presented to the to the GI clinic with complaints of dyspepsia, nausea, loose dark stools for 2 weeks associated with some abdominal discomfort.  He had a history of hemorrhoid for which he underwent surgery in 1990 and stated he was doing pretty good without hematochezia until recently.  He denies any hematemesis.  Patient does report heavy alcohol intake drinks 1 vodka every day. Patient is admitted for GI bleed,  GI is consulted recommended continue PPI twice daily,  Patient will eventually need EGD and colonoscopy.  Hospital course was complicated by new onset A. fib and alcohol withdrawal.  Cardiology consulted spontaneously converted into normal sinus rhythm, not a candidate for anticoagulation given acute GI bleed and recurrent falls.  Patient had active alcohol withdrawal yesterday EGD and colonoscopy is on hold until withdrawal symptoms resolved.  Assessment & Plan:   Principal Problem:   GIB (gastrointestinal bleeding) Active Problems:   Essential hypertension   Moderate COPD (chronic obstructive pulmonary disease) (HCC)   Peripheral vascular disease (HCC)   GERD (gastroesophageal reflux disease)   Type 2 diabetes mellitus with peripheral neuropathy (HCC)   Alcohol withdrawal delirium (HCC)   Atrial fibrillation with RVR (HCC)  Upper GI bleed: Patient presented with dark-colored stools. Patient does report prior history of duodenitis, duodenal ulcer and has been noncompliant with PPI. He continues to drink alcohol heavily.  Differentials include alcoholic gastritis versus recurrent ulcer disease. Continue pantoprazole 40 mg IV twice  daily. Hemoglobin remained stable, continue to monitor H&H. GI consulted. He is scheduled for EGD and colonoscopy on Tuesday morning.  CAD:  Patient has not been taking Plavix due to the financial barriers. Last PTCA 2018, hold aspirin due to GI bleed.  Denies any chest pain.  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  Continue Neurontin. Wife reported patient did better with the Lyrica but could not afford it anymore. PT and OT evaluation.  Elevated liver enzymes: This could be due to EtOH abuse. Continue to trend liver enzymes.  COPD:  Not in any acute exacerbation,  continue home inhalers.  Hypertension:  continue home medications.  Diabetes melitis: Continue regular insulin sliding scale, continue Neurontin.  ETOH Withdrawal Patient has been drinking alcohol heavily,  has developed withdrawal symptoms 9/2 Continue CIWA protocol.  Patient was restless and agitated requiring soft restraints. He is much improved now,   alert and oriented, restraints removed. Withdrawal symptoms resolved.  now scheduled for EGD colonoscopy on Tuesday  New onset  atrial fibrillation with RVR: Heart rate is now controlled, converted into sinus rhythm spontaneously Patient has CHADS Vasc of 5, he is not a candidate for anticoagulation due to acute GI bleed and recurrent falls. Continue metoprolol 25 mg twice daily.   DVT prophylaxis: SCDs Code Status: Full code. Family Communication: Wife at bedside Disposition Plan:   Status is: Inpatient  Remains inpatient appropriate because:Inpatient level of care appropriate due to severity of illness  Dispo: The patient is from: Home              Anticipated d/c is to: Home  Patient currently is not medically stable to d/c.   Difficult to place patient No  Consultants:  GI Cardiology  Procedures:  Antimicrobials:  Anti-infectives (From admission, onward)    None        Subjective: Patient was seen  and examined at bedside.  No overnight events. Patient is now comfortable, calm,  denies any further withdrawal symptoms. He is much alert , oriented following commands,  soft restraints removed.    Objective: Vitals:   11/18/20 2121 11/19/20 0456 11/19/20 0754 11/19/20 1000  BP: (!) 167/91 (!) 152/67  134/74  Pulse: 84 78  90  Resp: 20 20    Temp: 98 F (36.7 C) 98 F (36.7 C)    TempSrc:  Oral    SpO2:  96% 97%   Weight:      Height:        Intake/Output Summary (Last 24 hours) at 11/19/2020 1127 Last data filed at 11/19/2020 0000 Gross per 24 hour  Intake 1385.26 ml  Output 650 ml  Net 735.26 ml   Filed Weights   11/16/20 1348  Weight: 79.8 kg    Examination:  General exam: Appears comfortable, deconditioned, not in any acute distress.   Respiratory system: Clear to auscultation. Respiratory effort normal. Cardiovascular system: S1-S2 heard, rhythm regular, no murmur,. Gastrointestinal system: Abdomen is soft, nontender, nondistended, BS+ Central nervous system: Alert and oriented x 3.  No focal neurological deficits. Extremities: No edema, no cyanosis, no clubbing. Skin: No rashes, lesions or ulcers Psychiatry: Mood and affect appropriate.    Data Reviewed: I have personally reviewed following labs and imaging studies  CBC: Recent Labs  Lab 11/14/20 1115 11/14/20 1347 11/14/20 1717 11/14/20 2019 11/15/20 0500 11/15/20 1143 11/16/20 0809 11/17/20 1444 11/19/20 0429  WBC 5.9  --  5.5  --   --   --  6.0 7.4  --   NEUTROABS 3.6  --   --   --   --   --   --   --   --   HGB 14.0   < > 12.8*   < > 12.0* 12.0* 13.8 13.8 12.8*  HCT 39.7   < > 35.2*   < > 33.3* 33.3* 39.3 38.2* 35.4*  MCV 104.7*  --  103.5*  --   --   --  105.6* 104.9*  --   PLT 102*  --  81*  --   --   --  71* 92*  --    < > = values in this interval not displayed.   Basic Metabolic Panel: Recent Labs  Lab 11/14/20 1717 11/15/20 0500 11/15/20 1322 11/16/20 0809 11/17/20 1444  11/18/20 0520 11/19/20 0429  NA  --  140  --  140 142 140 137  K  --  3.3*  --  3.3* 3.2* 3.6 3.0*  CL  --  102  --  103 104 106 106  CO2  --  28  --  '26 26 23 23  ' GLUCOSE  --  100*  --  103* 84 83 100*  BUN  --  13  --  7* 6* 7* 8  CREATININE  --  0.73  --  0.66 0.61 0.49* 0.65  CALCIUM  --  8.3*  --  8.9 9.2 8.9 8.6*  MG 0.8*  --  2.6* 1.6* 1.4* 1.3* 1.5*  PHOS 2.5  --   --  2.4* 3.9  --  3.8   GFR: Estimated Creatinine Clearance: 93.3 mL/min (by  C-G formula based on SCr of 0.65 mg/dL). Liver Function Tests: Recent Labs  Lab 11/14/20 1115 11/16/20 0809 11/17/20 1444 11/19/20 0429  AST 221* 95* 106* 50*  ALT 63* 53* 59* 39  ALKPHOS 169* 158* 167* 132*  BILITOT 1.0 1.2 1.3* 0.7  PROT 6.6 6.9 7.1 5.8*  ALBUMIN 3.5 3.5 3.5 2.8*   Recent Labs  Lab 11/14/20 1115  LIPASE 92*   No results for input(s): AMMONIA in the last 168 hours. Coagulation Profile: Recent Labs  Lab 11/14/20 1115  INR 0.8   Cardiac Enzymes: No results for input(s): CKTOTAL, CKMB, CKMBINDEX, TROPONINI in the last 168 hours. BNP (last 3 results) No results for input(s): PROBNP in the last 8760 hours. HbA1C: No results for input(s): HGBA1C in the last 72 hours. CBG: Recent Labs  Lab 11/15/20 1603 11/16/20 0751 11/16/20 1201 11/16/20 1210 11/16/20 1703  GLUCAP 129* 98 98 97 88   Lipid Profile: No results for input(s): CHOL, HDL, LDLCALC, TRIG, CHOLHDL, LDLDIRECT in the last 72 hours. Thyroid Function Tests: No results for input(s): TSH, T4TOTAL, FREET4, T3FREE, THYROIDAB in the last 72 hours.  Anemia Panel: No results for input(s): VITAMINB12, FOLATE, FERRITIN, TIBC, IRON, RETICCTPCT in the last 72 hours. Sepsis Labs: No results for input(s): PROCALCITON, LATICACIDVEN in the last 168 hours.  Recent Results (from the past 240 hour(s))  Resp Panel by RT-PCR (Flu A&B, Covid) Nasopharyngeal Swab     Status: None   Collection Time: 11/14/20 11:30 AM   Specimen: Nasopharyngeal Swab;  Nasopharyngeal(NP) swabs in vial transport medium  Result Value Ref Range Status   SARS Coronavirus 2 by RT PCR NEGATIVE NEGATIVE Final    Comment: (NOTE) SARS-CoV-2 target nucleic acids are NOT DETECTED.  The SARS-CoV-2 RNA is generally detectable in upper respiratory specimens during the acute phase of infection. The lowest concentration of SARS-CoV-2 viral copies this assay can detect is 138 copies/mL. A negative result does not preclude SARS-Cov-2 infection and should not be used as the sole basis for treatment or other patient management decisions. A negative result may occur with  improper specimen collection/handling, submission of specimen other than nasopharyngeal swab, presence of viral mutation(s) within the areas targeted by this assay, and inadequate number of viral copies(<138 copies/mL). A negative result must be combined with clinical observations, patient history, and epidemiological information. The expected result is Negative.  Fact Sheet for Patients:  EntrepreneurPulse.com.au  Fact Sheet for Healthcare Providers:  IncredibleEmployment.be  This test is no t yet approved or cleared by the [place] FDA and  has been authorized for detection and/or diagnosis of SARS-CoV-2 by FDA under an Emergency Use Authorization (EUA). This EUA will remain  in effect (meaning this test can be used) for the duration of the COVID-19 declaration under Section 564(b)(1) of the Act, 21 U.S.C.section 360bbb-3(b)(1), unless the authorization is terminated  or revoked sooner.       Influenza A by PCR NEGATIVE NEGATIVE Final   Influenza B by PCR NEGATIVE NEGATIVE Final    Comment: (NOTE) The Xpert Xpress SARS-CoV-2/FLU/RSV plus assay is intended as an aid in the diagnosis of influenza from Nasopharyngeal swab specimens and should not be used as a sole basis for treatment. Nasal washings and aspirates are unacceptable for Xpert Xpress  SARS-CoV-2/FLU/RSV testing.  Fact Sheet for Patients: EntrepreneurPulse.com.au  Fact Sheet for Healthcare Providers: IncredibleEmployment.be  This test is not yet approved or cleared by the [place] FDA and has been authorized for detection and/or diagnosis of SARS-CoV-2 by  FDA under an Emergency Use Authorization (EUA). This EUA will remain in effect (meaning this test can be used) for the duration of the COVID-19 declaration under Section 564(b)(1) of the Act, 21 U.S.C. section 360bbb-3(b)(1), unless the authorization is terminated or revoked.  Performed at [facility], [place] [address], [place], [place] [address]     Radiology Studies: No results found.  Scheduled Meds:  atorvastatin  40 mg Oral Daily   fluticasone furoate-vilanterol  1 puff Inhalation Daily   And   umeclidinium bromide  1 puff Inhalation Daily   folic acid  1 mg Oral Daily   gabapentin  300 mg Oral TID   loratadine  10 mg Oral Daily   losartan  25 mg Oral Daily   metoprolol tartrate  25 mg Oral BID   multivitamin with minerals  1 tablet Oral Daily   nicotine  14 mg Transdermal Daily   pantoprazole  40 mg Oral BID AC   [START ON 11/20/2020] peg 3350 powder  0.5 kit Oral Once   And   [START ON 11/21/2020] peg 3350 powder  0.5 kit Oral Once   potassium chloride  40 mEq Oral Once   thiamine  100 mg Oral Daily   Or   thiamine  100 mg Intravenous Daily   traZODone  50 mg Oral QHS   Continuous Infusions:  lactated ringers 75 mL/hr at 11/19/20 0000     LOS: 4 days    Time spent: 25 mins    [place], MD [facility]   If 7PM-7AM, please contact night-coverage

## 2020-11-20 ENCOUNTER — Inpatient Hospital Stay (HOSPITAL_COMMUNITY): Payer: Medicare Other

## 2020-11-20 DIAGNOSIS — M7989 Other specified soft tissue disorders: Secondary | ICD-10-CM | POA: Diagnosis not present

## 2020-11-20 DIAGNOSIS — K922 Gastrointestinal hemorrhage, unspecified: Secondary | ICD-10-CM | POA: Diagnosis not present

## 2020-11-20 LAB — CBC
HCT: 33.5 % — ABNORMAL LOW (ref 39.0–52.0)
Hemoglobin: 11.8 g/dL — ABNORMAL LOW (ref 13.0–17.0)
MCH: 37.1 pg — ABNORMAL HIGH (ref 26.0–34.0)
MCHC: 35.2 g/dL (ref 30.0–36.0)
MCV: 105.3 fL — ABNORMAL HIGH (ref 80.0–100.0)
Platelets: 142 10*3/uL — ABNORMAL LOW (ref 150–400)
RBC: 3.18 MIL/uL — ABNORMAL LOW (ref 4.22–5.81)
RDW: 12.6 % (ref 11.5–15.5)
WBC: 5.7 10*3/uL (ref 4.0–10.5)
nRBC: 0 % (ref 0.0–0.2)

## 2020-11-20 LAB — BASIC METABOLIC PANEL
Anion gap: 4 — ABNORMAL LOW (ref 5–15)
BUN: 6 mg/dL — ABNORMAL LOW (ref 8–23)
CO2: 24 mmol/L (ref 22–32)
Calcium: 8.5 mg/dL — ABNORMAL LOW (ref 8.9–10.3)
Chloride: 109 mmol/L (ref 98–111)
Creatinine, Ser: 0.59 mg/dL — ABNORMAL LOW (ref 0.61–1.24)
GFR, Estimated: >60 mL/min (ref >60)
Glucose, Bld: 91 mg/dL (ref 70–99)
Potassium: 3.8 mmol/L (ref 3.5–5.1)
Sodium: 137 mmol/L (ref 135–145)

## 2020-11-20 LAB — GLUCOSE, CAPILLARY: Glucose-Capillary: 104 mg/dL — ABNORMAL HIGH (ref 70–99)

## 2020-11-20 MED ORDER — DOXYCYCLINE HYCLATE 100 MG PO TABS
100.0000 mg | ORAL_TABLET | Freq: Two times a day (BID) | ORAL | Status: DC
  Administered 2020-11-20 – 2020-11-22 (×5): 100 mg via ORAL
  Filled 2020-11-20 (×6): qty 1

## 2020-11-20 MED ORDER — OXYCODONE-ACETAMINOPHEN 5-325 MG PO TABS
1.0000 | ORAL_TABLET | Freq: Four times a day (QID) | ORAL | Status: DC | PRN
  Administered 2020-11-20 – 2020-11-22 (×6): 1 via ORAL
  Filled 2020-11-20 (×6): qty 1

## 2020-11-20 MED ORDER — OXYCODONE-ACETAMINOPHEN 5-325 MG PO TABS
1.0000 | ORAL_TABLET | Freq: Three times a day (TID) | ORAL | Status: DC | PRN
  Administered 2020-11-20: 1 via ORAL
  Filled 2020-11-20: qty 1

## 2020-11-20 NOTE — Progress Notes (Signed)
Pt with complaints of worsening R elbow/forearm pain throughout this shift. Arm edematous, red, and warm to touch. Arm elevated and MD made aware. PRN pain medication also requested. IVF disconnected and IVT consult placed for new IV on L side. Venous duplex also ordered. Will continue to monitor patient.

## 2020-11-20 NOTE — Progress Notes (Signed)
Physical Therapy Treatment Patient Details Name: Joshua [name] MRN: [id number] DOB: Sep 25, 1955 Today's Date: 11/20/2020    History of Present Illness Joshua [name] is a 65 y.o. male presents to GI clinic with c/o dyspepsia, nausea, loose dark stools x 2 weeks associated with some abdominal discomfort. PMH: neuropathy, COPD, lung ca, CAD s/p PTCA 2017, GERD, duodenal ulcer/duodenitis in 2018, diabetes, HTN, PAD, positive TB test, ACDF, posterior laminectomy/decompression lumbar spine, cadiac caths    PT Comments    Pt's level of alertness and mobility have improved.  Assisted OOB to amb in hallway.   General Comments: AxO x 3 improved.  Pt stated "I don't remeber the last couple of days".  "I do remember being tied up" (restraints) General bed mobility comments: self able required increased time General transfer comment: 25% VC's on proper hand placement to avoid pulling up on walker. Pt feeling better. Present with mild dyskenesia. General Gait Details: started out with amb with walker.  Required MinGuard Assist for stability present with R ankle "pain" chronic and general joint stiffness.  "I have not walked in several days".  Half way, amb with out walker (trial) with increased instability, increased gait ataxia and decreased WBing tolerance through R LE (bad ankle).  Pt did not amb with any AD prior to admit.  May need a cane.  TBD Pt declines any Rehab, "I'm going home" and "I'm going to quit drinking".   Follow Up Recommendations  No PT follow up (pt refusing In pt Rehab "I'm going home")     Equipment Recommendations  Cane    Recommendations for Other Services       Precautions / Restrictions Precautions Precautions: Fall Precautions: ETOH withdrawl Restrictions Weight Bearing Restrictions: No    Mobility  Bed Mobility Overal bed mobility: Needs Assistance Bed Mobility: Supine to Sit     Supine to sit: Supervision     General bed mobility comments: self able  required increased time    Transfers Overall transfer level: Needs assistance Equipment used: Rolling walker (2 wheeled) Transfers: Sit to/from [facility] Sit to Stand: Min assist;Min guard Stand pivot transfers: Min assist       General transfer comment: 25% VC's on proper hand placement to avoid pulling up on walker. Pt feeling better. Present with mild dyskenesia.  Ambulation/Gait Ambulation/Gait assistance: Min guard;Min assist Gait Distance (Feet): 145 Feet Assistive device: Rolling walker (2 wheeled);None Gait Pattern/deviations: Step-to pattern;Decreased stride length;Decreased stance time - right Gait velocity: decreased   General Gait Details: started out with amb with walker.  Required MinGuard Assist for stability present with R ankle "pain" chronic and general joint stiffness.  "I have not walked in several days".  Half way, amb with out walker (trial) with increased instability, increased gait ataxia and decreased WBing tolerance through R LE (bad ankle).  Pt did not amb with any AD prior to admit.  May need a cane.  TBD   Stairs             Wheelchair Mobility    Modified Rankin (Stroke Patients Only)       Balance                                            Cognition Arousal/Alertness: Awake/alert Behavior During Therapy: WFL for tasks assessed/performed Overall Cognitive Status: Within Functional Limits for tasks assessed  General Comments: AxO x 3 improved.  Pt stated "I don't remeber the last couple of days"      Exercises      General Comments        Pertinent Vitals/Pain Pain Assessment: Faces Faces Pain Scale: Hurts a little bit Pain Location: R ankle "bad" Pain Descriptors / Indicators: Sore;Grimacing Pain Intervention(s): Monitored during session    Home Living                      Prior Function            PT Goals (current goals can  now be found in the care plan section) Progress towards PT goals: Progressing toward goals    Frequency    Min 3X/week      PT Plan Current plan remains appropriate    Co-evaluation              AM-PAC PT "6 Clicks" Mobility   Outcome Measure  Help needed turning from your back to your side while in a flat bed without using bedrails?: A Little Help needed moving from lying on your back to sitting on the side of a flat bed without using bedrails?: A Little Help needed moving to and from a bed to a chair (including a wheelchair)?: A Little Help needed standing up from a chair using your arms (e.g., wheelchair or bedside chair)?: A Little Help needed to walk in hospital room?: A Little Help needed climbing 3-5 steps with a railing? : A Little 6 Click Score: 18    End of Session Equipment Utilized During Treatment: Gait belt Activity Tolerance: Patient tolerated treatment well;Patient limited by pain Patient left: in chair;with chair alarm set;with call bell/phone within reach;with family/visitor present Nurse Communication: Mobility status PT Visit Diagnosis: Unsteadiness on feet (R26.81);Other abnormalities of gait and mobility (R26.89);Repeated falls (R29.6);Muscle weakness (generalized) (M62.81)     Time: 1015-1030 PT Time Calculation (min) (ACUTE ONLY): 15 min  Charges:  $Gait Training: 8-22 mins                     {Cassidey Barrales  PTA Acute  Rehabilitation [employer]      [phone] Office      [phone]

## 2020-11-20 NOTE — Progress Notes (Signed)
Inpatient Rehab Admissions Coordinator:   Pt. Remains on CIWA, EGD and Colonoscopy on hold until withdrawal symptoms resolve. Pt. Is not medically stable for CIR at this time but I will continue to follow and pursue for admission once medically stable if Pt. Remains an appropriate candidate/  Clemens Catholic, [place], [place] Admissions Coordinator  [phone] (celll) [phone] (office)

## 2020-11-20 NOTE — Progress Notes (Signed)
He's been on clears all day and is eager to start prep this afternoon. All orders are written.

## 2020-11-20 NOTE — Progress Notes (Signed)
PROGRESS NOTE    Joshua [name]  WJX:[id number] DOB: [birth year] DOA: 11/14/2020 PCP: Jearld Fenton, NP   Brief Narrative:  This 65 years old male with history of COPD, lung cancer, CAD s/p PTCA 2017 who has not been taking Plavix but is still on aspirin due to cost issues, GERD, duodenal ulcer, duodenitis in 2018, noncompliant with PPI presented to the to the GI clinic with complaints of dyspepsia, nausea, loose dark stools for 2 weeks associated with some abdominal discomfort.  He had a history of hemorrhoid for which he underwent surgery in 1990 and stated he was doing pretty good without hematochezia until recently.  He denies any hematemesis.  Patient does report heavy alcohol intake drinks 1 vodka every day. Patient is admitted for GI bleed,  GI is consulted recommended continue PPI twice daily,  Patient will eventually need EGD and colonoscopy.  Hospital course was complicated by new onset A. fib and alcohol withdrawal.  Cardiology consulted spontaneously converted into normal sinus rhythm, not a candidate for anticoagulation given acute GI bleed and recurrent falls.  Patient had active alcohol withdrawal yesterday EGD and colonoscopy is on hold until withdrawal symptoms resolved.  Assessment & Plan:   Principal Problem:   GIB (gastrointestinal bleeding) Active Problems:   Essential hypertension   Moderate COPD (chronic obstructive pulmonary disease) (HCC)   Peripheral vascular disease (HCC)   GERD (gastroesophageal reflux disease)   Type 2 diabetes mellitus with peripheral neuropathy (HCC)   Alcohol withdrawal delirium (HCC)   Atrial fibrillation with RVR (HCC)  Upper GI bleed: Patient presented with dark-colored stools. Patient does report prior history of duodenitis, duodenal ulcer and has been noncompliant with PPI. He continues to drink alcohol heavily.  Differentials include alcoholic gastritis versus recurrent ulcer disease. Continue pantoprazole 40 mg IV twice  daily. Hemoglobin remained stable, continue to monitor H&H. GI consulted. He is scheduled for EGD and colonoscopy on Tuesday morning. NPO midnight Monday evening.  CAD:  Patient has not been taking Plavix due to the financial barriers. Last PTCA 2018, hold aspirin due to GI bleed.  Denies any chest pain.  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  Continue Neurontin. Wife reported patient did better with the Lyrica but could not afford it anymore. PT and OT evaluation.  Elevated liver enzymes: This could be due to EtOH abuse. Liver enzymes trending down.  COPD:  Not in any acute exacerbation,  continue home inhalers.  Hypertension:  continue home medications.  Diabetes melitis: Continue regular insulin sliding scale, continue Neurontin.  ETOH Withdrawal Patient has been drinking alcohol heavily,  has developed withdrawal symptoms on 9/2 Continue CIWA protocol.  Patient was restless and agitated requiring soft restraints. He is much improved now,   alert and oriented, restraints removed. Withdrawal symptoms resolved.  now scheduled for EGD colonoscopy on Tuesday  New onset  atrial fibrillation with RVR: Heart rate is now controlled, converted into sinus rhythm spontaneously Patient has CHADS Vasc of 5, he is not a candidate for anticoagulation due to acute GI bleed and recurrent falls. Continue metoprolol 25 mg twice daily.  Right elbow cellulitis: Start doxycycline 100 mg twice daily, warm compresses.  DVT prophylaxis: SCDs Code Status: Full code. Family Communication: Wife at bedside Disposition Plan:   Status is: Inpatient  Remains inpatient appropriate because:Inpatient level of care appropriate due to severity of illness  Dispo: The patient is from: Home  Anticipated d/c is to: Home              Patient currently is not medically stable to d/c.   Difficult to place patient No  Consultants:  GI Cardiology  Procedures:   Antimicrobials:  Anti-infectives (From admission, onward)    Start     Dose/Rate Route Frequency Ordered Stop   11/20/20 1000  doxycycline (VIBRA-TABS) tablet 100 mg        100 mg Oral Every 12 hours 11/20/20 0813          Subjective: Patient was seen and examined at bedside.  No overnight events. Patient is now comfortable, much calmer, denies any withdrawal symptoms. He is alert, oriented following commands.  He reports feeling much improved. He is scheduled to have EGD and colonoscopy tomorrow  Objective: Vitals:   11/19/20 2054 11/20/20 0550 11/20/20 0737 11/20/20 0825  BP: 116/72 98/63  126/72  Pulse: 64 (!) 58  72  Resp: 18     Temp: 98 F (36.7 C) 98.2 F (36.8 C)  98.5 F (36.9 C)  TempSrc:    Oral  SpO2: 97% 97% 98% 98%  Weight:      Height:        Intake/Output Summary (Last 24 hours) at 11/20/2020 1159 Last data filed at 11/20/2020 0504 Gross per 24 hour  Intake 2010.81 ml  Output 2050 ml  Net -39.19 ml   Filed Weights   11/16/20 1348  Weight: 79.8 kg    Examination:  General exam: Appears comfortable, deconditioned, not in any acute distress. Respiratory system: Clear to auscultation. Respiratory effort normal. Cardiovascular system: S1-S2 heard, rhythm regular, no murmur,. Gastrointestinal system: Abdomen is soft, nontender, nondistended, BS+ Central nervous system: Alert and oriented x 3.  No focal neurological deficits. Extremities: Right elbow soft tissue swelling.  Mild tenderness+ Skin: No rashes, lesions or ulcers Psychiatry: Mood and affect appropriate.    Data Reviewed: I have personally reviewed following labs and imaging studies  CBC: Recent Labs  Lab 11/14/20 1115 11/14/20 1347 11/14/20 1717 11/14/20 2019 11/15/20 1143 11/16/20 0809 11/17/20 1444 11/19/20 0429 11/20/20 0409  WBC 5.9  --  5.5  --   --  6.0 7.4  --  5.7  NEUTROABS 3.6  --   --   --   --   --   --   --   --   HGB 14.0   < > 12.8*   < > 12.0* 13.8 13.8 12.8*  11.8*  HCT 39.7   < > 35.2*   < > 33.3* 39.3 38.2* 35.4* 33.5*  MCV 104.7*  --  103.5*  --   --  105.6* 104.9*  --  105.3*  PLT 102*  --  81*  --   --  71* 92*  --  142*   < > = values in this interval not displayed.   Basic Metabolic Panel: Recent Labs  Lab 11/14/20 1717 11/15/20 0500 11/15/20 1322 11/16/20 0809 11/17/20 1444 11/18/20 0520 11/19/20 0429 11/20/20 0409  NA  --    < >  --  140 142 140 137 137  K  --    < >  --  3.3* 3.2* 3.6 3.0* 3.8  CL  --    < >  --  103 104 106 106 109  CO2  --    < >  --  _0 GLUCOSE  --    < >  --  103*  84 83 100* 91  BUN  --    < >  --  7* 6* 7* 8 6*  CREATININE  --    < >  --  0.66 0.61 0.49* 0.65 0.59*  CALCIUM  --    < >  --  8.9 9.2 8.9 8.6* 8.5*  MG 0.8*  --  2.6* 1.6* 1.4* 1.3* 1.5*  --   PHOS 2.5  --   --  2.4* 3.9  --  3.8  --    < > = values in this interval not displayed.   GFR: Estimated Creatinine Clearance: 93.3 mL/min (A) (by C-G formula based on SCr of 0.59 mg/dL (L)). Liver Function Tests: Recent Labs  Lab 11/14/20 1115 11/16/20 0809 11/17/20 1444 11/19/20 0429  AST 221* 95* 106* 50*  ALT 63* 53* 59* 39  ALKPHOS 169* 158* 167* 132*  BILITOT 1.0 1.2 1.3* 0.7  PROT 6.6 6.9 7.1 5.8*  ALBUMIN 3.5 3.5 3.5 2.8*   Recent Labs  Lab 11/14/20 1115  LIPASE 92*   No results for input(s): AMMONIA in the last 168 hours. Coagulation Profile: Recent Labs  Lab 11/14/20 1115  INR 0.8   Cardiac Enzymes: No results for input(s): CKTOTAL, CKMB, CKMBINDEX, TROPONINI in the last 168 hours. BNP (last 3 results) No results for input(s): PROBNP in the last 8760 hours. HbA1C: No results for input(s): HGBA1C in the last 72 hours. CBG: Recent Labs  Lab 11/15/20 1603 11/16/20 0751 11/16/20 1201 11/16/20 1210 11/16/20 1703  GLUCAP 129* 98 98 97 88   Lipid Profile: No results for input(s): CHOL, HDL, LDLCALC, TRIG, CHOLHDL, LDLDIRECT in the last 72 hours. Thyroid Function Tests: No results for input(s):  TSH, T4TOTAL, FREET4, T3FREE, THYROIDAB in the last 72 hours.  Anemia Panel: No results for input(s): VITAMINB12, FOLATE, FERRITIN, TIBC, IRON, RETICCTPCT in the last 72 hours. Sepsis Labs: No results for input(s): PROCALCITON, LATICACIDVEN in the last 168 hours.  Recent Results (from the past 240 hour(s))  Resp Panel by RT-PCR (Flu A&B, Covid) Nasopharyngeal Swab     Status: None   Collection Time: 11/14/20 11:30 AM   Specimen: Nasopharyngeal Swab; Nasopharyngeal(NP) swabs in vial transport medium  Result Value Ref Range Status   SARS Coronavirus 2 by RT PCR NEGATIVE NEGATIVE Final    Comment: (NOTE) SARS-CoV-2 target nucleic acids are NOT DETECTED.  The SARS-CoV-2 RNA is generally detectable in upper respiratory specimens during the acute phase of infection. The lowest concentration of SARS-CoV-2 viral copies this assay can detect is 138 copies/mL. A negative result does not preclude SARS-Cov-2 infection and should not be used as the sole basis for treatment or other patient management decisions. A negative result may occur with  improper specimen collection/handling, submission of specimen other than nasopharyngeal swab, presence of viral mutation(s) within the areas targeted by this assay, and inadequate number of viral copies(<138 copies/mL). A negative result must be combined with clinical observations, patient history, and epidemiological information. The expected result is Negative.  Fact Sheet for Patients:  EntrepreneurPulse.com.au  Fact Sheet for Healthcare Providers:  IncredibleEmployment.be  This test is no t yet approved or cleared by the [place] FDA and  has been authorized for detection and/or diagnosis of SARS-CoV-2 by FDA under an Emergency Use Authorization (EUA). This EUA will remain  in effect (meaning this test can be used) for the duration of the COVID-19 declaration under Section 564(b)(1) of the Act,  21 U.S.C.section 360bbb-3(b)(1), unless the authorization is terminated  or  revoked sooner.       Influenza A by PCR NEGATIVE NEGATIVE Final   Influenza B by PCR NEGATIVE NEGATIVE Final    Comment: (NOTE) The Xpert Xpress SARS-CoV-2/FLU/RSV plus assay is intended as an aid in the diagnosis of influenza from Nasopharyngeal swab specimens and should not be used as a sole basis for treatment. Nasal washings and aspirates are unacceptable for Xpert Xpress SARS-CoV-2/FLU/RSV testing.  Fact Sheet for Patients: EntrepreneurPulse.com.au  Fact Sheet for Healthcare Providers: IncredibleEmployment.be  This test is not yet approved or cleared by the [place] FDA and has been authorized for detection and/or diagnosis of SARS-CoV-2 by FDA under an Emergency Use Authorization (EUA). This EUA will remain in effect (meaning this test can be used) for the duration of the COVID-19 declaration under Section 564(b)(1) of the Act, 21 U.S.C. section 360bbb-3(b)(1), unless the authorization is terminated or revoked.  Performed at [facility], [place] [address] [place], [place] [address]     Radiology Studies: No results found.  Scheduled Meds:  atorvastatin  40 mg Oral Daily   doxycycline  100 mg Oral Q12H   fluticasone furoate-vilanterol  1 puff Inhalation Daily   And   umeclidinium bromide  1 puff Inhalation Daily   folic acid  1 mg Oral Daily   gabapentin  300 mg Oral TID   loratadine  10 mg Oral Daily   losartan  25 mg Oral Daily   metoprolol tartrate  25 mg Oral BID   multivitamin with minerals  1 tablet Oral Daily   nicotine  14 mg Transdermal Daily   pantoprazole  40 mg Oral BID AC   peg 3350 powder  0.5 kit Oral Once   And   [START ON 11/21/2020] peg 3350 powder  0.5 kit Oral Once   thiamine  100 mg Oral Daily   Or   thiamine  100 mg Intravenous Daily   traZODone  50 mg Oral QHS   Continuous Infusions:  lactated  ringers 75 mL/hr at 11/20/20 0504     LOS: 5 days    Time spent: 25 mins     , MD [facility]   If 7PM-7AM, please contact night-coverage

## 2020-11-20 NOTE — Progress Notes (Signed)
Right upper extremity venous duplex has been completed. Preliminary results can be found in CV Proc through chart review.  Results were given to the patient's nurse, Ria Comment.  11/20/20 4:39 PM Joshua [name] RVT

## 2020-11-21 ENCOUNTER — Inpatient Hospital Stay (HOSPITAL_COMMUNITY): Payer: Medicare Other | Admitting: Registered Nurse

## 2020-11-21 ENCOUNTER — Encounter (HOSPITAL_COMMUNITY): Payer: Self-pay | Admitting: Internal Medicine

## 2020-11-21 ENCOUNTER — Ambulatory Visit: Payer: Medicare Other | Admitting: Cardiovascular Disease

## 2020-11-21 ENCOUNTER — Encounter (HOSPITAL_COMMUNITY)
Admission: EM | Disposition: A | Discharge status: Home / Self Care | Payer: Self-pay | Source: Ambulatory Visit | Attending: Family Medicine

## 2020-11-21 DIAGNOSIS — K635 Polyp of colon: Secondary | ICD-10-CM

## 2020-11-21 DIAGNOSIS — K259 Gastric ulcer, unspecified as acute or chronic, without hemorrhage or perforation: Secondary | ICD-10-CM

## 2020-11-21 DIAGNOSIS — K254 Chronic or unspecified gastric ulcer with hemorrhage: Secondary | ICD-10-CM

## 2020-11-21 DIAGNOSIS — K921 Melena: Secondary | ICD-10-CM

## 2020-11-21 DIAGNOSIS — K2100 Gastro-esophageal reflux disease with esophagitis, without bleeding: Secondary | ICD-10-CM

## 2020-11-21 HISTORY — PX: POLYPECTOMY: SHX5525

## 2020-11-21 HISTORY — PX: COLONOSCOPY WITH PROPOFOL: SHX5780

## 2020-11-21 HISTORY — PX: ESOPHAGOGASTRODUODENOSCOPY (EGD) WITH PROPOFOL: SHX5813

## 2020-11-21 HISTORY — PX: BIOPSY: SHX5522

## 2020-11-21 LAB — COMPREHENSIVE METABOLIC PANEL
ALT: 39 U/L (ref 0–44)
AST: 49 U/L — ABNORMAL HIGH (ref 15–41)
Albumin: 3 g/dL — ABNORMAL LOW (ref 3.5–5.0)
Alkaline Phosphatase: 134 U/L — ABNORMAL HIGH (ref 38–126)
Anion gap: 9 (ref 5–15)
BUN: <5 mg/dL — ABNORMAL LOW (ref 8–23)
CO2: 20 mmol/L — ABNORMAL LOW (ref 22–32)
Calcium: 8.8 mg/dL — ABNORMAL LOW (ref 8.9–10.3)
Chloride: 109 mmol/L (ref 98–111)
Creatinine, Ser: 0.67 mg/dL (ref 0.61–1.24)
GFR, Estimated: >60 mL/min (ref >60)
Glucose, Bld: 89 mg/dL (ref 70–99)
Potassium: 3.8 mmol/L (ref 3.5–5.1)
Sodium: 138 mmol/L (ref 135–145)
Total Bilirubin: 0.8 mg/dL (ref 0.3–1.2)
Total Protein: 5.9 g/dL — ABNORMAL LOW (ref 6.5–8.1)

## 2020-11-21 LAB — CBC
HCT: 35.9 % — ABNORMAL LOW (ref 39.0–52.0)
Hemoglobin: 12.4 g/dL — ABNORMAL LOW (ref 13.0–17.0)
MCH: 37 pg — ABNORMAL HIGH (ref 26.0–34.0)
MCHC: 34.5 g/dL (ref 30.0–36.0)
MCV: 107.2 fL — ABNORMAL HIGH (ref 80.0–100.0)
Platelets: 185 10*3/uL (ref 150–400)
RBC: 3.35 MIL/uL — ABNORMAL LOW (ref 4.22–5.81)
RDW: 12.8 % (ref 11.5–15.5)
WBC: 6.1 10*3/uL (ref 4.0–10.5)
nRBC: 0 % (ref 0.0–0.2)

## 2020-11-21 LAB — GLUCOSE, CAPILLARY: Glucose-Capillary: 92 mg/dL (ref 70–99)

## 2020-11-21 LAB — MAGNESIUM: Magnesium: 1.2 mg/dL — ABNORMAL LOW (ref 1.7–2.4)

## 2020-11-21 LAB — PHOSPHORUS: Phosphorus: 4 mg/dL (ref 2.5–4.6)

## 2020-11-21 SURGERY — COLONOSCOPY WITH PROPOFOL
Anesthesia: Monitor Anesthesia Care

## 2020-11-21 MED ORDER — PROPOFOL 500 MG/50ML IV EMUL
INTRAVENOUS | Status: DC | PRN
  Administered 2020-11-21: 125 ug/kg/min via INTRAVENOUS

## 2020-11-21 MED ORDER — LIDOCAINE 2% (20 MG/ML) 5 ML SYRINGE
INTRAMUSCULAR | Status: DC | PRN
  Administered 2020-11-21: 60 mg via INTRAVENOUS

## 2020-11-21 MED ORDER — HYDROCORTISONE ACETATE 25 MG RE SUPP
25.0000 mg | Freq: Every day | RECTAL | Status: DC
  Filled 2020-11-21: qty 1

## 2020-11-21 MED ORDER — PROPOFOL 1000 MG/100ML IV EMUL
INTRAVENOUS | Status: AC
  Filled 2020-11-21: qty 200

## 2020-11-21 MED ORDER — MAGNESIUM SULFATE 2 GM/50ML IV SOLN
2.0000 g | Freq: Once | INTRAVENOUS | Status: AC
  Administered 2020-11-21: 2 g via INTRAVENOUS
  Filled 2020-11-21: qty 50

## 2020-11-21 MED ORDER — PHENYLEPHRINE 40 MCG/ML (10ML) SYRINGE FOR IV PUSH (FOR BLOOD PRESSURE SUPPORT)
PREFILLED_SYRINGE | INTRAVENOUS | Status: DC | PRN
  Administered 2020-11-21: 80 ug via INTRAVENOUS

## 2020-11-21 MED ORDER — PROPOFOL 10 MG/ML IV BOLUS
INTRAVENOUS | Status: DC | PRN
  Administered 2020-11-21: 40 mg via INTRAVENOUS
  Administered 2020-11-21 (×2): 30 mg via INTRAVENOUS

## 2020-11-21 SURGICAL SUPPLY — 25 items

## 2020-11-21 NOTE — Progress Notes (Signed)
OT Cancellation Note  Patient Details Name: Joshua [name] MRN: [id number] DOB: 12/26/1955   Cancelled Treatment:    Reason Eval/Treat Not Completed: Patient at procedure or test/ unavailable Patient is off floor for endoscopy. Will check back as schedule allows.   Jackelyn Poling OTR/L, [facility] Office# [phone] Pager# [phone]  11/21/2020, 7:01 AM

## 2020-11-21 NOTE — TOC Progression Note (Signed)
Transition of Care [facility]) - Progression Note    Patient Details  Name: KEADEN GUNNOE MRN: [id number] Date of Birth: May 08, 1955  Transition of Care [facility]) CM/SW Contact  Moustafa Mossa, Juliann Pulse, RN Phone Number: 11/21/2020, 3:38 PM  Clinical Narrative:Spoke to spouse on rm phone about d/c plans & services needed. Informed Judy-no medical need for [facility] voiced understanding. Patient has declined any SA resources. No further CM needs.      Expected Discharge Plan: Home/Self Care Barriers to Discharge: Continued Medical Work up  Expected Discharge Plan and Services Expected Discharge Plan: Home/Self Care   Discharge Planning Services: CM Consult   Living arrangements for the past 2 months: Single Family Home                                       Social Determinants of Health (SDOH) Interventions    Readmission Risk Interventions No flowsheet data found.

## 2020-11-21 NOTE — Op Note (Signed)
[facility] Patient Name: Joshua [name] Procedure Date: 11/21/2020 MRN: [id number] Attending MD: Mauri Pole , MD Date of Birth: 1956-01-14 CSN: [id number] Age: 65 Admit Type: Inpatient Procedure:                Upper GI endoscopy Indications:              Recent gastrointestinal bleeding, Suspected upper                            gastrointestinal bleeding, Gastrointestinal                            bleeding of unknown origin Providers:                Mauri Pole, MD, Hinton Dyer Particia Nearing,                            RN Referring MD:              Medicines:                Monitored Anesthesia Care Complications:            No immediate complications. Estimated Blood Loss:     Estimated blood loss was minimal. Procedure:                Pre-Anesthesia Assessment:                           - Prior to the procedure, a History and Physical                            was performed, and patient medications and                            allergies were reviewed. The patient's tolerance of                            previous anesthesia was also reviewed. The risks                            and benefits of the procedure and the sedation                            options and risks were discussed with the patient.                            All questions were answered, and informed consent                            was obtained. Prior Anticoagulants: The patient has                            taken no previous anticoagulant or antiplatelet                            agents except for aspirin. ASA  Grade Assessment:                            III - A patient with severe systemic disease. After                            reviewing the risks and benefits, the patient was                            deemed in satisfactory condition to undergo the                            procedure.                           After obtaining informed consent, the endoscope was                             passed under direct vision. Throughout the                            procedure, the patient's blood pressure, pulse, and                            oxygen saturations were monitored continuously. The                            [id number]-H190 ([id number] Olympus endoscope was introduced                            through the mouth, and advanced to the second part                            of duodenum. The upper GI endoscopy was                            accomplished without difficulty. The patient                            tolerated the procedure well. Scope In: Scope Out: Findings:      A small hiatal hernia was present.      LA Grade B (one or more mucosal breaks greater than 5 mm, not extending       between the tops of two mucosal folds) esophagitis with no bleeding was       found 36 to 38 cm from the incisors.      No other gross lesions were noted in the entire esophagus.      Few non-bleeding superficial gastric ulcers with no stigmata of bleeding       were found in the prepyloric region of the stomach. The largest lesion       was 5 mm in largest dimension. Biopsies were taken with a cold forceps       for Helicobacter pylori testing.      Patchy mild inflammation characterized by congestion (edema) and       erythema was  found in the entire examined stomach. Biopsies were taken       with a cold forceps for histology. Biopsies were taken with a cold       forceps for Helicobacter pylori testing.      The examined duodenum was normal.      The cardia and gastric fundus were normal on retroflexion. Impression:               - Small hiatal hernia.                           - LA Grade B reflux esophagitis with no bleeding.                           - No gross lesions in esophagus.                           - Non-bleeding gastric ulcers with no stigmata of                            bleeding. Biopsied.                           - Gastritis. Biopsied.                            - Normal examined duodenum. Moderate Sedation:      Not Applicable - Patient had care per Anesthesia. Recommendation:           - Patient has a contact number available for                            emergencies. The signs and symptoms of potential                            delayed complications were discussed with the                            patient. Return to normal activities tomorrow.                            Written discharge instructions were provided to the                            patient.                           - Resume previous diet.                           - Continue present medications.                           - Await pathology results.                           - Use Protonix (pantoprazole) 40 mg PO daily.                           -  No ibuprofen, naproxen, or other non-steroidal                            anti-inflammatory drugs.                           - See the other procedure note for documentation of                            additional recommendations. Procedure Code(s):        --- Professional ---                           [phone], Esophagogastroduodenoscopy, flexible,                            transoral; with biopsy, single or multiple Diagnosis Code(s):        --- Professional ---                           K21.00, Gastro-esophageal reflux disease with                            esophagitis, without bleeding                           K25.9, Gastric ulcer, unspecified as acute or                            chronic, without hemorrhage or perforation                           K29.70, Gastritis, unspecified, without bleeding                           K92.2, Gastrointestinal hemorrhage, unspecified CPT copyright 2019 American Medical Association. All rights reserved. The codes documented in this report are preliminary and upon coder review may  be revised to meet current compliance requirements. Mauri Pole, MD 11/21/2020  8:27:33 AM This report has been signed electronically. Number of Addenda: 0

## 2020-11-21 NOTE — Progress Notes (Signed)
Inpatient Rehab Admissions Coordinator:     Note Pt.'s withdrawal sx are improving. EGD and colonoscopy completed today. I await OT note and wil open a case with Pt.'s insurance today.    Clemens Catholic, [place], [place] Admissions Coordinator  [phone] ([place]) [phone] (office)

## 2020-11-21 NOTE — Care Management Important Message (Signed)
Important Message  Patient Details IM Letter given to the Patient. Name: Joshua [name] MRN: [id number] Date of Birth: [birth year]   Medicare Important Message Given:  Yes     Kerin Salen 11/21/2020, 1:09 PM

## 2020-11-21 NOTE — Anesthesia Postprocedure Evaluation (Signed)
Anesthesia Post Note  Patient: Joshua [name]  Procedure(s) Performed: COLONOSCOPY WITH PROPOFOL ESOPHAGOGASTRODUODENOSCOPY (EGD) WITH PROPOFOL BIOPSY POLYPECTOMY     Patient location during evaluation: Endoscopy Anesthesia Type: MAC Level of consciousness: awake and alert Pain management: pain level controlled Vital Signs Assessment: post-procedure vital signs reviewed and stable Respiratory status: spontaneous breathing, nonlabored ventilation and respiratory function stable Cardiovascular status: blood pressure returned to baseline and stable Postop Assessment: no apparent nausea or vomiting Anesthetic complications: no   No notable events documented.  Last Vitals:  Vitals:   11/21/20 0840 11/21/20 0850  BP: (!) 110/59 (!) 123/57  Pulse: 72 80  Resp: 14 19  Temp:    SpO2: 100% 100%    Last Pain:  Vitals:   11/21/20 0850  TempSrc:   PainSc: 9                  Lizandro Spellman E Reeves Musick

## 2020-11-21 NOTE — Anesthesia Preprocedure Evaluation (Signed)
Anesthesia Evaluation  Patient identified by MRN, date of birth, ID band Patient awake    Reviewed: Allergy & Precautions, NPO status , Patient's Chart, lab work & pertinent test results  History of Anesthesia Complications Negative for: history of anesthetic complications  Airway Mallampati: II  TM Distance: >3 FB Neck ROM: Full    Dental  (+) Edentulous Upper, Edentulous Lower   Pulmonary COPD, Current Smoker and Patient abstained from smoking.,  Lung cancer   Pulmonary exam normal        Cardiovascular hypertension, + CAD, + Cardiac Stents (2017), + Peripheral Vascular Disease and + DVT (s/p IVC filter)  Normal cardiovascular exam+ dysrhythmias Atrial Fibrillation    Echo 11/15/20: EF 60-65%, no RWMA, normal diastolic fn, normal RV fn, normal MV/AV   Neuro/Psych    GI/Hepatic PUD, GERD  ,(+)     substance abuse  alcohol use, UGI bleed   Endo/Other  diabetes, Type 2  Renal/GU negative Renal ROS  negative genitourinary   Musculoskeletal negative musculoskeletal ROS (+)   Abdominal   Peds  Hematology  (+) anemia , Plavix   Anesthesia Other Findings  Presented with upper GIB. Experienced acute alcohol withdrawal during admission as well as new onset A fib w/ RVR (now NSR, rate controlled)  Reproductive/Obstetrics                           Anesthesia Physical Anesthesia Plan  ASA: 3  Anesthesia Plan: MAC   Post-op Pain Management:    Induction: Intravenous  PONV Risk Score and Plan: 1 and Propofol infusion, TIVA and Treatment may vary due to age or medical condition  Airway Management Planned: Natural Airway, Nasal Cannula and Simple Face Mask  Additional Equipment: None  Intra-op Plan:   Post-operative Plan:   Informed Consent: I have reviewed the patients History and Physical, chart, labs and discussed the procedure including the risks, benefits and alternatives for the  proposed anesthesia with the patient or authorized representative who has indicated his/her understanding and acceptance.       Plan Discussed with:   Anesthesia Plan Comments:         Anesthesia Quick Evaluation

## 2020-11-21 NOTE — Transfer of Care (Signed)
Immediate Anesthesia Transfer of Care Note  Patient: Joshua [name]  Procedure(s) Performed: COLONOSCOPY WITH PROPOFOL ESOPHAGOGASTRODUODENOSCOPY (EGD) WITH PROPOFOL BIOPSY POLYPECTOMY  Patient Location: PACU  Anesthesia Type:MAC  Level of Consciousness: sedated  Airway & Oxygen Therapy: Patient Spontanous Breathing and Patient connected to face mask oxygen  Post-op Assessment: Report given to RN and Post -op Vital signs reviewed and stable  Post vital signs: Reviewed and stable  Last Vitals:  Vitals Value Taken Time  BP    Temp    Pulse 81 11/21/20 0830  Resp 23 11/21/20 0830  SpO2 100 % 11/21/20 0830  Vitals shown include unvalidated device data.  Last Pain:  Vitals:   11/21/20 0650  TempSrc: Oral  PainSc: 9       Patients Stated Pain Goal: 3 (48/54/62 7035)  Complications: No notable events documented.

## 2020-11-21 NOTE — H&P (Signed)
[facility] History and Physical   Primary Care Physician:  Jearld Fenton, NP   Reason for Procedure:   GI bleeding  Plan:    EGD and Colonoscopy     HPI: Joshua [name] is a 65 y.o. male with multiple co morbidities with melena and hematochezia.  The risks and benefits as well as alternatives of endoscopic procedure(s) have been discussed and reviewed. All questions answered. The patient agrees to proceed.    Past Medical History:  Diagnosis Date   AC (acromioclavicular) joint bone spurs, unspecified laterality 11/25/2016   Arthritis    "back" (05/16/2015)   CAD in native artery, occluded Lcx and rotational atherectomy to LAD with DES 05/17/15 05/18/2015   Cavitary lesion of lung 02/25/2018   Chronic back pain    "all my back"   Chronic bronchitis (HCC)    COPD (chronic obstructive pulmonary disease) ([place])    Diabetes mellitus without complication (HCC)    GERD (gastroesophageal reflux disease)    H/O blood clots    "had them in my back; put filter in before one of my neck ORs"   History of gout    Hyperlipidemia    Hypertension    Neuromuscular disorder ([place])    PAD (peripheral artery disease) (HCC)    Phlebitis    Positive TB test    Seasonal allergies    Shortness of breath    Spinal disease     Past Surgical History:  Procedure Laterality Date   ABDOMINAL AORTOGRAM W/LOWER EXTREMITY  01/07/2018   ABDOMINAL AORTOGRAM W/LOWER EXTREMITY N/A 01/07/2018   Procedure: ABDOMINAL AORTOGRAM W/LOWER EXTREMITY;  Surgeon: Wellington Hampshire, MD;  Location: [place] [facility];  Service: Cardiovascular;  Laterality: N/A;   ANTERIOR CERVICAL DECOMP/DISCECTOMY FUSION  ~ 2001-2009 X 3   ANTERIOR CERVICAL DECOMP/DISCECTOMY FUSION N/A 10/06/2012   Procedure: ANTERIOR CERVICAL DECOMPRESSION/DISCECTOMY FUSION 1 LEVEL;  Surgeon: [name], MD;  Location: [place] [facility];  Service: Neurosurgery;  Laterality: N/A;  C7T1 anterior cervical decompression with fusion  plating and bonegraft    BACK SURGERY     CARDIAC CATHETERIZATION  05/16/2015   CARDIAC CATHETERIZATION N/A 05/16/2015   Procedure: Left Heart Cath and Coronary Angiography;  Surgeon: [name], MD;  Location: [place] [facility];  Service: Cardiovascular;  Laterality: N/A;   CARDIAC CATHETERIZATION N/A 05/16/2015   Procedure: Coronary Balloon Angioplasty;  Surgeon: [name], MD;  Location: [place] [facility];  Service: Cardiovascular;  Laterality: N/A;   CARDIAC CATHETERIZATION N/A 05/17/2015   Procedure: Coronary Stent Intervention Rotoblater;  Surgeon: [name], MD;  Location: [place] [facility];  Service: Cardiovascular;  Laterality: N/A;   ELBOW SURGERY Left 2016   "had bone pieces removed"   ESOPHAGEAL MANOMETRY N/A 12/30/2016   Procedure: ESOPHAGEAL MANOMETRY (EM);  Surgeon: Irene Shipper, MD;  Location: [facility];  Service: Endoscopy;  Laterality: N/A;   EXCISIONAL HEMORRHOIDECTOMY     FEMORAL BYPASS  ~ 2010   IR RADIOLOGIST EVAL & MGMT  12/17/2016   IVC FILTER PLACEMENT ([place] HX)  2010   "had blood clots in my back"   NECK SURGERY  2014   2008-2014   PERIPHERAL VASCULAR ATHERECTOMY  01/07/2018   Procedure: PERIPHERAL VASCULAR ATHERECTOMY;  Surgeon: Wellington Hampshire, MD;  Location: [place] [facility];  Service: Cardiovascular;;   PERIPHERAL VASCULAR BALLOON ANGIOPLASTY  01/07/2018   Procedure: PERIPHERAL VASCULAR BALLOON ANGIOPLASTY;  Surgeon: Wellington Hampshire, MD;  Location: [facility];  Service: Cardiovascular;;   PERIPHERAL VASCULAR CATHETERIZATION N/A 02/14/2016   Procedure: Abdominal Aortogram w/Lower Extremity;  Surgeon: Wellington Hampshire, MD;  Location: [place] [facility];  Service: Cardiovascular;  Laterality: N/A;   POSTERIOR LAMINECTOMY / DECOMPRESSION LUMBAR SPINE  2006   "bone spurs"   VIDEO BRONCHOSCOPY WITH ENDOBRONCHIAL NAVIGATION Right 02/25/2018   Procedure: VIDEO BRONCHOSCOPY WITH ENDOBRONCHIAL NAVIGATION;  Surgeon: Garner Nash, DO;   Location: [place];  Service: Thoracic;  Laterality: Right;   VIDEO BRONCHOSCOPY WITH ENDOBRONCHIAL ULTRASOUND Right 02/25/2018   Procedure: VIDEO BRONCHOSCOPY WITH ENDOBRONCHIAL ULTRASOUND;  Surgeon: Garner Nash, DO;  Location: [place];  Service: Thoracic;  Laterality: Right;    Prior to Admission medications   Medication Sig Start Date End Date Taking? Authorizing Provider  acetaminophen (TYLENOL) 500 MG tablet Take 1,000 mg by mouth every 6 (six) hours as needed for mild pain.   Yes [provider]  albuterol (PROAIR HFA) 108 (90 Base) MCG/ACT inhaler USE 2 PUFFS EVERY 6 HOURS  AS NEEDED FOR WHEEZING OR  SHORTNESS OF BREATH Patient taking differently: Inhale 2 puffs into the lungs every 6 (six) hours as needed for wheezing or shortness of breath. 01/19/20  Yes Icard, Bradley L, DO  albuterol (PROVENTIL) (2.5 MG/3ML) 0.083% nebulizer solution Take 3 mLs (2.5 mg total) by nebulization every 6 (six) hours as needed for wheezing or shortness of breath. 09/16/18  Yes Icard, Octavio Graves, DO  aspirin EC 81 MG tablet Take 81 mg by mouth daily.   Yes [provider]  colchicine 0.6 MG tablet TAKE 2 TABLETS BY MOUTH AS  DIRECTED THEN 1 TABLET  AFTER 1 HOUR. CAN TAKE 1  TABLET TWICE DAILY  THEREAFTER. Patient taking differently: Take 0.6 mg by mouth 2 (two) times daily as needed (gout flare). 11/28/19  Yes Jearld Fenton, NP  fexofenadine (ALLEGRA ALLERGY) 180 MG tablet Take 1 tablet (180 mg total) by mouth daily. 12/19/17  Yes Baity, Coralie Keens, NP  Fluticasone-Umeclidin-Vilant (TRELEGY ELLIPTA) 100-62.5-25 MCG/INH AEPB Inhale 1 puff into the lungs daily. 01/19/20  Yes Icard, Bradley L, DO  gabapentin (NEURONTIN) 300 MG capsule Take 1 capsule (300 mg total) by mouth 3 (three) times daily. 05/11/20  Yes Copland, Frederico Hamman, MD  losartan (COZAAR) 25 MG tablet TAKE 1 TABLET BY MOUTH  DAILY Patient taking differently: Take 25 mg by mouth daily. 07/14/20  Yes Dutch Quint B, FNP  methocarbamol (ROBAXIN)  500 MG tablet Take 500 mg by mouth every 6 (six) hours as needed for muscle spasms. 11/02/20  Yes [provider]  OVER THE COUNTER MEDICATION Apply 1 application topically daily as needed (neuropathy pain). Hemp oil cream   Yes [provider]  traZODone (DESYREL) 100 MG tablet TAKE 1 TABLET BY MOUTH AT  BEDTIME Patient taking differently: Take 100 mg by mouth at bedtime as needed for sleep. 09/24/19  Yes Jearld Fenton, NP  atorvastatin (LIPITOR) 40 MG tablet Take 1 tablet (40 mg total) by mouth daily. 11/14/20   Wellington Hampshire, MD  Blood Glucose Monitoring Suppl (ONETOUCH VERIO) w/Device KIT 1 Device by Does not apply route once. 05/19/15   Jearld Fenton, NP  clopidogrel (PLAVIX) 75 MG tablet Take 1 tablet (75 mg total) by mouth daily. 11/14/20   Wellington Hampshire, MD  omeprazole (PRILOSEC) 40 MG capsule TAKE 1 CAPSULE BY MOUTH  DAILY Patient not taking: Reported on 11/14/2020 11/14/20   Zehr, Laban Emperor, PA-C  ONE [facility]  LANCETS MISC 1 each by Does not apply route 3 (three) times daily. 05/19/15   Jearld Fenton, NP  ONETOUCH ULTRA test strip USE TO TEST 3 TIMES DAILY 07/14/20   Dutch Quint B, FNP    Current Facility-Administered Medications  Medication Dose Route Frequency Provider Last Rate Last Admin   [MAR Hold] acetaminophen (TYLENOL) tablet 650 mg  650 mg Oral Q6H PRN Guilford Shi, MD   650 mg at 11/20/20 0445   [MAR Hold] albuterol (PROVENTIL) (2.5 MG/3ML) 0.083% nebulizer solution 3 mL  3 mL Inhalation Q6H PRN Guilford Shi, MD       [MAR Hold] atorvastatin (LIPITOR) tablet 40 mg  40 mg Oral Daily Guilford Shi, MD   40 mg at 11/20/20 0853   [MAR Hold] doxycycline (VIBRA-TABS) tablet 100 mg  100 mg Oral Q12H Shawna Clamp, MD   100 mg at 11/20/20 2211   [MAR Hold] fluticasone furoate-vilanterol (BREO ELLIPTA) 100-25 MCG/INH 1 puff  1 puff Inhalation Daily Guilford Shi, MD   1 puff at 11/20/20 0737   And   [MAR Hold] umeclidinium bromide (INCRUSE  ELLIPTA) 62.5 MCG/INH 1 puff  1 puff Inhalation Daily Guilford Shi, MD   1 puff at 11/20/20 7341   [MAR Hold] folic acid (FOLVITE) tablet 1 mg  1 mg Oral Daily Guilford Shi, MD   1 mg at 11/20/20 0851   [MAR Hold] gabapentin (NEURONTIN) capsule 300 mg  300 mg Oral TID Guilford Shi, MD   300 mg at 11/20/20 2204   [facility] Hold] hydrALAZINE (APRESOLINE) injection 10 mg  10 mg Intravenous Q6H PRN Shawna Clamp, MD   10 mg at 11/21/20 0533   lactated ringers infusion   Intravenous Continuous Guilford Shi, MD 75 mL/hr at 11/21/20 0723 Continued from Pre-op at 11/21/20 0723   [MAR Hold] loratadine (CLARITIN) tablet 10 mg  10 mg Oral Daily Guilford Shi, MD   10 mg at 11/20/20 0852   [MAR Hold] losartan (COZAAR) tablet 25 mg  25 mg Oral Daily Guilford Shi, MD   25 mg at 11/20/20 0854   [MAR Hold] metoprolol tartrate (LOPRESSOR) injection 5 mg  5 mg Intravenous Q4H PRN Arrien, Jimmy Picket, MD       [facility] Hold] metoprolol tartrate (LOPRESSOR) tablet 25 mg  25 mg Oral BID Donato Heinz, MD   25 mg at 11/20/20 2204   [MAR Hold] multivitamin with minerals tablet 1 tablet  1 tablet Oral Daily Guilford Shi, MD   1 tablet at 11/20/20 0851   [MAR Hold] nicotine (NICODERM CQ - dosed in mg/24 hours) patch 14 mg  14 mg Transdermal Daily Guilford Shi, MD   14 mg at 11/20/20 0857   [MAR Hold] ondansetron (ZOFRAN) tablet 4 mg  4 mg Oral Q6H PRN Guilford Shi, MD       Or   [MAR Hold] ondansetron (ZOFRAN) injection 4 mg  4 mg Intravenous Q6H PRN Guilford Shi, MD       [MAR Hold] oxyCODONE-acetaminophen (PERCOCET/ROXICET) 5-325 MG per tablet 1 tablet  1 tablet Oral Q6H PRN Shawna Clamp, MD   1 tablet at 11/20/20 2204   [MAR Hold] pantoprazole (PROTONIX) EC tablet 40 mg  40 mg Oral BID AC Milus Banister, MD   40 mg at 11/20/20 1653   [MAR Hold] thiamine tablet 100 mg  100 mg Oral Daily Guilford Shi, MD   100 mg at 11/20/20 0852   Or   [MAR Hold] thiamine  (B-1) injection 100 mg  100 mg Intravenous Daily Guilford Shi, MD   100 mg at 11/15/20 1241   [MAR Hold] tiZANidine (ZANAFLEX) tablet 2 mg  2 mg Oral Q6H PRN Guilford Shi, MD   2 mg at 11/20/20 0445   [MAR Hold] traZODone (DESYREL) tablet 50 mg  50 mg Oral QHS Guilford Shi, MD   50 mg at 11/20/20 2204    Allergies as of 11/14/2020 - Review Complete 11/14/2020  Allergen Reaction Noted   Lisinopril Hives and Itching 07/27/2014   Amitriptyline Other (See Comments) 05/15/2016   Contrast media [iodinated diagnostic agents] Hives and Itching 11/20/2011   Ibuprofen Other (See Comments) 11/20/2011    Family History  Problem Relation Age of Onset   Stomach cancer Mother        Deceased, 6s   Hypertension Mother    Hypertension Father    Stroke Father    Heart attack Father        Deceased, 24   Aneurysm Brother    Healthy Daughter    Healthy Maternal Grandmother    Leukemia Grandchild    Diabetes Neg Hx    Early death Neg Hx    Colon cancer Neg Hx    Pancreatic cancer Neg Hx     Social History   Socioeconomic History   Marital status: Married    Spouse name: Not on file   Number of children: Not on file   Years of education: 7   Highest education level: Not on file  Occupational History   Occupation: Disabled    Employer: DISABLED  Tobacco Use   Smoking status: Every Day    Packs/day: 0.50    Years: 49.00    Pack years: 24.50    Types: Cigarettes   Smokeless tobacco: Never   Tobacco comments:    half a pack a day   Vaping Use   Vaping Use: Never used  Substance and Sexual Activity   Alcohol use: Yes    Alcohol/week: 0.0 standard drinks    Comment: 2 drinks a week   Drug use: No   Sexual activity: Not Currently  Other Topics Concern   Not on file  Social History Narrative   Regular exercise-no   Caffeine Use-yes   Lives with wife in a one story home. Has 2 children.  On disability for low back pain.  Used to work as a Games developer, lasted worked in  2007.     Education: 7th grade.   Social Determinants of Health   Financial Resource Strain: Not on file  Food Insecurity: Not on file  Transportation Needs: Not on file  Physical Activity: Not on file  Stress: Not on file  Social Connections: Not on file  Intimate Partner Violence: Not on file    Review of Systems:  All other review of systems negative except as mentioned in the HPI.  Physical Exam: Vital signs in last 24 hours: Temp:  [98.5 F (36.9 C)-99.2 F (37.3 C)] 99.2 F (37.3 C) (09/06 0650) Pulse Rate:  [67-84] 84 (09/06 0650) Resp:  [17-21] 21 (09/06 0650) BP: (126-173)/(70-87) 167/70 (09/06 0650) SpO2:  [91 %-100 %] 100 % (09/06 0650) Last BM Date: 11/20/20 General:   Alert, NAD Lungs:  Clear .   Heart:  Regular rate and rhythm Abdomen:  Soft, nontender and nondistended. Neuro/Psych:  Alert and cooperative. Normal mood and affect. A and O x 3   K. Denzil Magnuson , MD [phone]

## 2020-11-21 NOTE — Op Note (Signed)
[facility] Patient Name: Joshua [name] Procedure Date: 11/21/2020 MRN: [id number] Attending MD: Mauri Pole , MD Date of Birth: 1955/07/07 CSN: [id number] Age: 65 Admit Type: Inpatient Procedure:                Colonoscopy Indications:              Evaluation of unexplained GI bleeding presenting                            with Hematochezia Providers:                Mauri Pole, MD, Hinton Dyer Referring MD:              Medicines:                Monitored Anesthesia Care Complications:            No immediate complications. Estimated Blood Loss:     Estimated blood loss was minimal. Procedure:                Pre-Anesthesia Assessment:                           - Prior to the procedure, a History and Physical                            was performed, and patient medications and                            allergies were reviewed. The patient's tolerance of                            previous anesthesia was also reviewed. The risks                            and benefits of the procedure and the sedation                            options and risks were discussed with the patient.                            All questions were answered, and informed consent                            was obtained. Prior Anticoagulants: The patient has                            taken no previous anticoagulant or antiplatelet                            agents except for aspirin. ASA Grade Assessment:                            III - A patient with severe systemic disease. After  reviewing the risks and benefits, the patient was                            deemed in satisfactory condition to undergo the                            procedure.                           After obtaining informed consent, the colonoscope                            was passed under direct vision. Throughout the                            procedure, the patient's blood  pressure, pulse, and                            oxygen saturations were monitored continuously. The                            [id number]-HQ190L ([id number] Olympus colonoscope was                            introduced through the anus and advanced to the the                            terminal ileum, with identification of the                            appendiceal orifice and IC valve. The colonoscopy                            was performed without difficulty. The patient                            tolerated the procedure well. The quality of the                            bowel preparation was excellent. The terminal                            ileum, ileocecal valve, appendiceal orifice, and                            rectum were photographed. Scope In: 8:13:41 AM Scope Out: 8:23:34 AM Scope Withdrawal Time: 0 hours 8 minutes 15 seconds  Total Procedure Duration: 0 hours 9 minutes 53 seconds  Findings:      The perianal and digital rectal examinations were normal.      Two sessile polyps were found in the ascending colon. The polyps were 7       to 11 mm in size. These polyps were removed with a cold snare. Resection       and retrieval were complete.      Non-bleeding external  and internal hemorrhoids were found during       retroflexion. The hemorrhoids were large.      The exam was otherwise without abnormality. Impression:               - Two 7 to 11 mm polyps in the ascending colon,                            removed with a cold snare. Resected and retrieved.                           - Non-bleeding external and internal hemorrhoids.                            Likely source of hematochezia.                           - The examination was otherwise normal. Moderate Sedation:      Not Applicable - Patient had care per Anesthesia. Recommendation:           - Patient has a contact number available for                            emergencies. The signs and symptoms of potential                             delayed complications were discussed with the                            patient. Return to normal activities tomorrow.                            Written discharge instructions were provided to the                            patient.                           - Resume previous diet.                           - Continue present medications.                           - Await pathology results.                           - Repeat colonoscopy in 3 - 5 years for                            surveillance based on pathology results.                           - Use hydrocortisone suppository 25 mg 1 per rectum  once a day for 7 days.                           - Ok to DC home from GI standpoint                           - GI will sign off, available if have any questions Procedure Code(s):        --- Professional ---                           [phone], Colonoscopy, flexible; with removal of                            tumor(s), polyp(s), or other lesion(s) by snare                            technique Diagnosis Code(s):        --- Professional ---                           K63.5, Polyp of colon                           K64.8, Other hemorrhoids                           K92.1, Melena (includes Hematochezia) CPT copyright 2019 [employer]. All rights reserved. The codes documented in this report are preliminary and upon coder review may  be revised to meet current compliance requirements. Mauri Pole, MD 11/21/2020 8:31:19 AM This report has been signed electronically. Number of Addenda: 0

## 2020-11-21 NOTE — Progress Notes (Signed)
PROGRESS NOTE    Joshua [name]  FWY:[id number] DOB: Jan 29, 1956 DOA: 11/14/2020 PCP: Jearld Fenton, NP   Brief Narrative:  This 65 years old male with history of COPD, lung cancer, CAD s/p PTCA 2017 who has not been taking Plavix but is still on aspirin due to cost issues, GERD, duodenal ulcer, duodenitis in 2018, noncompliant with PPI presented to the to the GI clinic with complaints of dyspepsia, nausea, loose dark stools for 2 weeks associated with some abdominal discomfort.  He had a history of hemorrhoid for which he underwent surgery in 1990 and stated he was doing pretty good without hematochezia until recently.  He denies any hematemesis.  Patient does report heavy alcohol intake drinks 1 vodka every day. Patient is admitted for GI bleed,  GI is consulted recommended continue PPI twice daily,  Hospital course was complicated by new onset A. fib and alcohol withdrawal.  Cardiology consulted, Afib spontaneously converted into normal sinus rhythm, not a candidate for anticoagulation given acute GI bleed and recurrent falls.  Patient had active alcohol withdrawal symptoms, EGD and colonoscopy was on hold until withdrawal symptoms resolved. Patient had EGD and colonoscopy 9/6.  EGD shows LA grade a esophagitis, nonbleeding gastric ulcers.  Colonoscopy shows 2 polyps which were removed.  Otherwise unremarkable.  Assessment & Plan:   Principal Problem:   GIB (gastrointestinal bleeding) Active Problems:   Essential hypertension   Moderate COPD (chronic obstructive pulmonary disease) (HCC)   Peripheral vascular disease (HCC)   GERD (gastroesophageal reflux disease)   Type 2 diabetes mellitus with peripheral neuropathy (HCC)   Alcohol withdrawal delirium (HCC)   Atrial fibrillation with RVR (HCC)   Melena   Hematochezia   Polyp of ascending colon  Upper GI bleed: Patient presented with dark-colored stools. Patient does report prior history of duodenitis, duodenal ulcer and has been  noncompliant with PPI. He continues to drink alcohol heavily.  Differentials include alcoholic gastritis versus recurrent ulcer disease. Continue pantoprazole 40 mg IV twice daily. Hemoglobin remained stable, GI consulted.  EGD and colonoscopy was kept on hold until withdrawal symptoms resolves. Patient underwent EGD and colonoscopy on 9/6: EGD shows gastritis, nonbleeding gastric ulcers.  Biopsied.   Colonoscopy shows 2 polyps which were removed.  CAD:  Patient has not been taking Plavix due to the financial barriers. Last PTCA 2018, hold aspirin due to GI bleed.  Denies any chest pain.  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  Continue Neurontin. Wife reported patient did better with the Lyrica but could not afford it anymore. PT and OT evaluation.  Elevated liver enzymes: This could be due to EtOH abuse. Liver enzymes trending down.  COPD:  Not in any acute exacerbation,  continue home inhalers.  Hypertension:  continue home medications.  Diabetes melitis: Continue regular insulin sliding scale, continue Neurontin.  ETOH Withdrawal Patient has been drinking alcohol heavily,  has developed withdrawal symptoms on 9/2 Continue CIWA protocol.  Patient was restless and agitated requiring soft restraints. He is much improved now,   alert and oriented, restraints removed. Withdrawal symptoms resolved.   New onset  atrial fibrillation with RVR: Heart rate is now controlled, converted into sinus rhythm spontaneously Patient has CHADS Vasc of 5, he is not a candidate for anticoagulation due to acute GI bleed and recurrent falls. Continue metoprolol 25 mg twice daily.  Right elbow cellulitis: Continue doxycycline 100 mg twice daily, warm compresses.  DVT prophylaxis: SCDs Code Status: Full code. Family Communication: Wife  at bedside Disposition Plan:   Status is: Inpatient  Remains inpatient appropriate because:Inpatient level of care appropriate  due to severity of illness  Dispo: The patient is from: Home              Anticipated d/c is to: Home              Patient currently is not medically stable to d/c.   Difficult to place patient No  Consultants:  GI Cardiology  Procedures:  Antimicrobials:  Anti-infectives (From admission, onward)    Start     Dose/Rate Route Frequency Ordered Stop   11/20/20 1000  doxycycline (VIBRA-TABS) tablet 100 mg        100 mg Oral Every 12 hours 11/20/20 0813          Subjective: Patient was seen and examined at bedside.  No overnight events. Patient is now comfortable, much calmer, denies any withdrawal symptoms. He underwent EGD and colonoscopy,  reports feeling better, He asks when he can be discharged.  Objective: Vitals:   11/21/20 0831 11/21/20 0840 11/21/20 0850 11/21/20 1214  BP: (!) 111/49 (!) 110/59 (!) 123/57 113/63  Pulse: 81 72 80 67  Resp: (!) 23 14 19 16   Temp: 98.7 F (37.1 C)   97.8 F (36.6 C)  TempSrc: Temporal   Oral  SpO2: 100% 100% 100% 95%  Weight:      Height:        Intake/Output Summary (Last 24 hours) at 11/21/2020 1539 Last data filed at 11/21/2020 0823 Gross per 24 hour  Intake 1430.54 ml  Output 1175 ml  Net 255.54 ml   Filed Weights   11/16/20 1348  Weight: 79.8 kg    Examination:  General exam: Appears comfortable, deconditioned, not in any acute distress. Respiratory system: Clear to auscultation. Respiratory effort normal. Cardiovascular system: S1-S2 heard, rhythm regular, no murmur,. Gastrointestinal system: Abdomen is soft, nontender, nondistended, BS+ Central nervous system: Alert and oriented x 3.  No focal neurological deficits. Extremities: Right elbow soft tissue swelling.  Mild tenderness+ Skin: No rashes, lesions or ulcers Psychiatry: Mood and affect appropriate.    Data Reviewed: I have personally reviewed following labs and imaging studies  CBC: Recent Labs  Lab 11/14/20 1717 11/14/20 2019 11/16/20 0809  11/17/20 1444 11/19/20 0429 11/20/20 0409 11/21/20 0418  WBC 5.5  --  6.0 7.4  --  5.7 6.1  HGB 12.8*   < > 13.8 13.8 12.8* 11.8* 12.4*  HCT 35.2*   < > 39.3 38.2* 35.4* 33.5* 35.9*  MCV 103.5*  --  105.6* 104.9*  --  105.3* 107.2*  PLT 81*  --  71* 92*  --  142* 185   < > = values in this interval not displayed.   Basic Metabolic Panel: Recent Labs  Lab 11/14/20 1717 11/15/20 0500 11/16/20 0809 11/17/20 1444 11/18/20 0520 11/19/20 0429 11/20/20 0409 11/21/20 0418  NA  --    < > 140 142 140 137 137 138  K  --    < > 3.3* 3.2* 3.6 3.0* 3.8 3.8  CL  --    < > 103 104 106 106 109 109  CO2  --    < > 26 26 23 23 24  20*  GLUCOSE  --    < > 103* 84 83 100* 91 89  BUN  --    < > 7* 6* 7* 8 6* <5*  CREATININE  --    < > 0.66 0.61 0.49*  0.65 0.59* 0.67  CALCIUM  --    < > 8.9 9.2 8.9 8.6* 8.5* 8.8*  MG 0.8*   < > 1.6* 1.4* 1.3* 1.5*  --  1.2*  PHOS 2.5  --  2.4* 3.9  --  3.8  --  4.0   < > = values in this interval not displayed.   GFR: Estimated Creatinine Clearance: 93.3 mL/min (by C-G formula based on SCr of 0.67 mg/dL). Liver Function Tests: Recent Labs  Lab 11/16/20 0809 11/17/20 1444 11/19/20 0429 11/21/20 0418  AST 95* 106* 50* 49*  ALT 53* 59* 39 39  ALKPHOS 158* 167* 132* 134*  BILITOT 1.2 1.3* 0.7 0.8  PROT 6.9 7.1 5.8* 5.9*  ALBUMIN 3.5 3.5 2.8* 3.0*   No results for input(s): LIPASE, AMYLASE in the last 168 hours.  No results for input(s): AMMONIA in the last 168 hours. Coagulation Profile: No results for input(s): INR, PROTIME in the last 168 hours.  Cardiac Enzymes: No results for input(s): CKTOTAL, CKMB, CKMBINDEX, TROPONINI in the last 168 hours. BNP (last 3 results) No results for input(s): PROBNP in the last 8760 hours. HbA1C: No results for input(s): HGBA1C in the last 72 hours. CBG: Recent Labs  Lab 11/16/20 1201 11/16/20 1210 11/16/20 1703 11/20/20 1216 11/21/20 0658  GLUCAP 98 97 88 104* 92   Lipid Profile: No results for  input(s): CHOL, HDL, LDLCALC, TRIG, CHOLHDL, LDLDIRECT in the last 72 hours. Thyroid Function Tests: No results for input(s): TSH, T4TOTAL, FREET4, T3FREE, THYROIDAB in the last 72 hours.  Anemia Panel: No results for input(s): VITAMINB12, FOLATE, FERRITIN, TIBC, IRON, RETICCTPCT in the last 72 hours. Sepsis Labs: No results for input(s): PROCALCITON, LATICACIDVEN in the last 168 hours.  Recent Results (from the past 240 hour(s))  Resp Panel by RT-PCR (Flu A&B, Covid) Nasopharyngeal Swab     Status: None   Collection Time: 11/14/20 11:30 AM   Specimen: Nasopharyngeal Swab; Nasopharyngeal(NP) swabs in vial transport medium  Result Value Ref Range Status   SARS Coronavirus 2 by RT PCR NEGATIVE NEGATIVE Final    Comment: (NOTE) SARS-CoV-2 target nucleic acids are NOT DETECTED.  The SARS-CoV-2 RNA is generally detectable in upper respiratory specimens during the acute phase of infection. The lowest concentration of SARS-CoV-2 viral copies this assay can detect is 138 copies/mL. A negative result does not preclude SARS-Cov-2 infection and should not be used as the sole basis for treatment or other patient management decisions. A negative result may occur with  improper specimen collection/handling, submission of specimen other than nasopharyngeal swab, presence of viral mutation(s) within the areas targeted by this assay, and inadequate number of viral copies(<138 copies/mL). A negative result must be combined with clinical observations, patient history, and epidemiological information. The expected result is Negative.  Fact Sheet for Patients:  EntrepreneurPulse.com.au  Fact Sheet for Healthcare Providers:  IncredibleEmployment.be  This test is no t yet approved or cleared by the [place] FDA and  has been authorized for detection and/or diagnosis of SARS-CoV-2 by FDA under an Emergency Use Authorization (EUA). This EUA will remain  in  effect (meaning this test can be used) for the duration of the COVID-19 declaration under Section 564(b)(1) of the Act, 21 U.S.C.section 360bbb-3(b)(1), unless the authorization is terminated  or revoked sooner.       Influenza A by PCR NEGATIVE NEGATIVE Final   Influenza B by PCR NEGATIVE NEGATIVE Final    Comment: (NOTE) The Xpert Xpress SARS-CoV-2/FLU/RSV plus assay is intended as  an aid in the diagnosis of influenza from Nasopharyngeal swab specimens and should not be used as a sole basis for treatment. Nasal washings and aspirates are unacceptable for Xpert Xpress SARS-CoV-2/FLU/RSV testing.  Fact Sheet for Patients: EntrepreneurPulse.com.au  Fact Sheet for Healthcare Providers: IncredibleEmployment.be  This test is not yet approved or cleared by the [place] FDA and has been authorized for detection and/or diagnosis of SARS-CoV-2 by FDA under an Emergency Use Authorization (EUA). This EUA will remain in effect (meaning this test can be used) for the duration of the COVID-19 declaration under Section 564(b)(1) of the Act, 21 U.S.C. section 360bbb-3(b)(1), unless the authorization is terminated or revoked.  Performed at [facility], [place] [address] [place], [place] [address]     Radiology Studies: VAS [place] UPPER EXTREMITY VENOUS DUPLEX  Result Date: 11/20/2020 UPPER VENOUS STUDY  Patient Name:  KEMANI DEMARAIS Malachi  Date of Exam:   11/20/2020 Medical Rec #: [id number]           Accession #:    [id number] Date of Birth: 11/21/1955           Patient Gender: M Patient Age:   66 years Exam Location:  [facility] Procedure:      VAS [place] UPPER EXTREMITY VENOUS DUPLEX Referring Phys: Shawna Clamp --------------------------------------------------------------------------------  Indications: Swelling Risk Factors: None identified. Comparison Study: No prior studies. Performing Technologist: Oliver Hum RVT  Examination  Guidelines: A complete evaluation includes B-mode imaging, spectral Doppler, color Doppler, and power Doppler as needed of all accessible portions of each vessel. Bilateral testing is considered an integral part of a complete examination. Limited examinations for reoccurring indications may be performed as noted.  Right Findings: +----------+------------+---------+-----------+----------+-------+ RIGHT     CompressiblePhasicitySpontaneousPropertiesSummary +----------+------------+---------+-----------+----------+-------+ IJV           Full       Yes       Yes                      +----------+------------+---------+-----------+----------+-------+ Subclavian    Full       Yes       Yes                      +----------+------------+---------+-----------+----------+-------+ Axillary      Full       Yes       Yes                      +----------+------------+---------+-----------+----------+-------+ Brachial      Full       Yes       Yes                      +----------+------------+---------+-----------+----------+-------+ Radial        Full                                          +----------+------------+---------+-----------+----------+-------+ Ulnar         Full                                          +----------+------------+---------+-----------+----------+-------+ Cephalic      Full                                          +----------+------------+---------+-----------+----------+-------+  Basilic       Full                                          +----------+------------+---------+-----------+----------+-------+  Left Findings: +----------+------------+---------+-----------+----------+-------+ LEFT      CompressiblePhasicitySpontaneousPropertiesSummary +----------+------------+---------+-----------+----------+-------+ Subclavian    Full       Yes       Yes                       +----------+------------+---------+-----------+----------+-------+  Summary:  Right: No evidence of deep vein thrombosis in the upper extremity. No evidence of superficial vein thrombosis in the upper extremity.  Left: No evidence of thrombosis in the subclavian.  *See table(s) above for measurements and observations.  Diagnosing physician: Deitra Mayo MD Electronically signed by Deitra Mayo MD on 11/20/2020 at 7:55:05 PM.    Final     Scheduled Meds:  atorvastatin  40 mg Oral Daily   doxycycline  100 mg Oral Q12H   fluticasone furoate-vilanterol  1 puff Inhalation Daily   And   umeclidinium bromide  1 puff Inhalation Daily   folic acid  1 mg Oral Daily   gabapentin  300 mg Oral TID   hydrocortisone  25 mg Rectal QHS   loratadine  10 mg Oral Daily   losartan  25 mg Oral Daily   metoprolol tartrate  25 mg Oral BID   multivitamin with minerals  1 tablet Oral Daily   nicotine  14 mg Transdermal Daily   pantoprazole  40 mg Oral BID AC   thiamine  100 mg Oral Daily   Or   thiamine  100 mg Intravenous Daily   traZODone  50 mg Oral QHS   Continuous Infusions:  lactated ringers 75 mL/hr at 11/21/20 0750     LOS: 6 days    Time spent: 25 mins    Latysha Thackston, MD [facility]   If 7PM-7AM, please contact night-coverage

## 2020-11-21 NOTE — Progress Notes (Signed)
Occupational Therapy Treatment Patient Details Name: Joshua [name] MRN: [id number] DOB: 09-21-1955 Today's Date: 11/21/2020    History of present illness Joshua [name] is a 65 y.o. male presents to GI clinic with c/o dyspepsia, nausea, loose dark stools x 2 weeks associated with some abdominal discomfort. PMH: neuropathy, COPD, lung ca, CAD s/p PTCA 2017, GERD, duodenal ulcer/duodenitis in 2018, diabetes, HTN, PAD, positive TB test, ACDF, posterior laminectomy/decompression lumbar spine, cadiac caths   OT comments  Patient was noted to have continued balance deficits with RW balance challenges. Patient was noted to have multiple small losses of balance during session with min guard to regain balance. Patient was noted to have continued challenges with balance after education was provided. Patient would continued to need 24/7 supervision at home for safety with ADLs and functional mobility. Patient's discharge plan remains appropriate at this time. OT will continue to follow acutely.    Follow Up Recommendations  CIR    Equipment Recommendations  None recommended by OT    Recommendations for Other Services      Precautions / Restrictions Precautions Precautions: Fall Restrictions Weight Bearing Restrictions: No       Mobility Bed Mobility Overal bed mobility: Needs Assistance Bed Mobility: Supine to Sit     Supine to sit: Supervision Sit to supine: Supervision   General bed mobility comments: cues for safety    Transfers Overall transfer level: Needs assistance Equipment used: Rolling walker (2 wheeled) Transfers: Sit to/from [facility] Sit to Stand: Min guard Stand pivot transfers: Min guard       General transfer comment: continued education on proper hand placement for use of RW    Balance Overall balance assessment: Needs assistance Sitting-balance support: No upper extremity supported Sitting balance-Leahy Scale: Fair     Standing  balance support: During functional activity;Bilateral upper extremity supported Standing balance-Leahy Scale: Fair Standing balance comment: with occasional UE support.                           ADL either performed or assessed with clinical judgement   ADL Overall ADL's : Needs assistance/impaired Eating/Feeding: Set up   Grooming: Set up;Standing;Supervision/safety   Upper Body Bathing: Standing;Min guard Upper Body Bathing Details (indicate cue type and reason): patient simulated standign in shower to complete UB bathing tasks. Lower Body Bathing: Supervison/ safety;Sitting/lateral leans Lower Body Bathing Details (indicate cue type and reason): patient was supervision to complete simulated LB bathing tasks.         Toilet Transfer: Education administrator Details (indicate cue type and reason): patient was educated on threshold navigation with RW. patient verbalized understanding. Toileting- Water quality scientist and Hygiene: Min guard;Sit to/from stand Toileting - Clothing Manipulation Details (indicate cue type and reason): with RW     Functional mobility during ADLs: Min guard;Rolling walker General ADL Comments: Patient was educated on RW manudering in room and over surfaces with patient noted to pick up walker with turns consistently with patient not applying education during action. patient was noted to have multiple small LOB with ability to recover with RW and min guard.     Vision   Vision Assessment?: No apparent visual deficits   Perception     Praxis      Cognition Arousal/Alertness: Awake/alert Behavior During Therapy: WFL for tasks assessed/performed Overall Cognitive Status: Within Functional Limits for tasks assessed  Exercises     Shoulder Instructions       General Comments      Pertinent Vitals/ Pain       Pain Assessment: Faces Faces Pain Scale: Hurts  a little bit Pain Location: R ankle "bad" Pain Descriptors / Indicators: Sore;Grimacing Pain Intervention(s): Monitored during session  Home Living Family/patient expects to be discharged to:: Private residence Living Arrangements: Spouse/significant other                                      Prior Functioning/Environment              Frequency  Min 2X/week        Progress Toward Goals  OT Goals(current goals can now be found in the care plan section)  Progress towards OT goals: Progressing toward goals     Plan Discharge plan remains appropriate    Co-evaluation                 AM-PAC OT "6 Clicks" Daily Activity     Outcome Measure   Help from another person eating meals?: None Help from another person taking care of personal grooming?: A Little Help from another person toileting, which includes using toliet, bedpan, or urinal?: A Little Help from another person bathing (including washing, rinsing, drying)?: A Little Help from another person to put on and taking off regular upper body clothing?: None Help from another person to put on and taking off regular lower body clothing?: A Little 6 Click Score: 20    End of Session Equipment Utilized During Treatment: Rolling walker  OT Visit Diagnosis: Unsteadiness on feet (R26.81)   Activity Tolerance Patient tolerated treatment well   Patient Left in chair;with call bell/phone within reach;with chair alarm set;with family/visitor present   Nurse Communication Other (comment) (nurse cleared patient to participate in session,.)        Time: 1459-1525 OT Time Calculation (min): 26 min  Charges: OT General Charges $OT Visit: 1 Visit OT Treatments $Self Care/Home Management : 23-37 mins  Jackelyn Poling OTR/L, MS Acute Rehabilitation Department Office# [phone] Pager# [phone]    [place] 11/21/2020, 3:59 PM

## 2020-11-22 ENCOUNTER — Encounter (HOSPITAL_COMMUNITY): Payer: Self-pay | Admitting: Gastroenterology

## 2020-11-22 DIAGNOSIS — F10231 Alcohol dependence with withdrawal delirium: Secondary | ICD-10-CM | POA: Diagnosis not present

## 2020-11-22 DIAGNOSIS — K254 Chronic or unspecified gastric ulcer with hemorrhage: Secondary | ICD-10-CM | POA: Diagnosis not present

## 2020-11-22 DIAGNOSIS — I4891 Unspecified atrial fibrillation: Secondary | ICD-10-CM | POA: Diagnosis not present

## 2020-11-22 DIAGNOSIS — I10 Essential (primary) hypertension: Secondary | ICD-10-CM | POA: Diagnosis not present

## 2020-11-22 LAB — SURGICAL PATHOLOGY

## 2020-11-22 MED ORDER — PREGABALIN 25 MG PO CAPS: 25.0000 mg | ORAL_CAPSULE | Freq: Every day | ORAL | 0 refills | Status: DC

## 2020-11-22 MED ORDER — PANTOPRAZOLE SODIUM 40 MG PO TBEC: 40.0000 mg | DELAYED_RELEASE_TABLET | Freq: Two times a day (BID) | ORAL | 0 refills | Status: DC

## 2020-11-22 MED ORDER — HYDROCORTISONE ACETATE 25 MG RE SUPP: 25.0000 mg | Freq: Every day | RECTAL | 0 refills | Status: DC

## 2020-11-22 MED ORDER — METOPROLOL TARTRATE 25 MG PO TABS: 25.0000 mg | ORAL_TABLET | Freq: Two times a day (BID) | ORAL | 0 refills | Status: DC

## 2020-11-22 MED ORDER — PREGABALIN 25 MG PO CAPS
25.0000 mg | ORAL_CAPSULE | Freq: Every day | ORAL | Status: DC
  Administered 2020-11-22: 25 mg via ORAL
  Filled 2020-11-22: qty 1

## 2020-11-22 NOTE — Progress Notes (Signed)
Pt refused to let lab take blood this morning.  Told them to revisit on dayshift

## 2020-11-22 NOTE — Discharge Summary (Addendum)
Physician Discharge Summary  Joshua [name] KDX:[id number] DOB: 03-14-1956 DOA: 11/14/2020  PCP: Joshua [name]  Admit date: 11/14/2020 Discharge date: 11/22/2020  Admitted From: Home  Disposition:   home   Recommendations for Outpatient Follow-up and new medication changes:  Follow up with Joshua [name] in 7 to 10 days.  Patient has been placed on pantoprazole for gastric ulcer and hydrocortisone suppository for hemorrhoids. Metoprolol for rate control atrial fibrillation, holding on anticoagulation due to history of alcohol abuse. If patient continue to be sober to consider starting anticoagulation. Follow up with cardiology as outpatient.   Changed gabapentin for pregabalin for neuropathy   Home Health: no   Equipment/Devices: na   Discharge Condition: stable  CODE STATUS:  full Diet recommendation: heart healthy   Brief/Interim Summary: Joshua [name] was admitted to the hospital with working diagnosis of upper GI bleed complicated by acute alcohol withdrawal syndrome.   65 year old male past medical history for COPD, lung cancer, coronary artery disease, GERD, duodenal ulcers and duodenitis who presented with dark stools for about 2 weeks.  Initially melena but then evolved into hematochezia.  Several bowel movements per day up to 20.  Heavy alcohol consumption daily.  On his initial physical examination blood pressure 112/73, heart rate 103, respiratory rate 18, temperature 98.2, oxygen saturation 98%, his lungs had mild wheezing, heart S1-S2, present, rhythmic, abdomen tender to palpation at the epigastrium, no rebound no guarding, no lower extremity edema.   Sodium 141, potassium 3.5, chloride 102, bicarb 24, glucose 109, BUN 10, creatinine 0.73, white count 5.9, hemoglobin 14.0, hematocrit 39.7, platelets 102. SARS COVID-19 negative.   Chest radiograph with hyperinflation, right upper lobe scarring.   Patient was placed on proton pump inhibitors and had frequent  hemoglobin/hematocrit checks. His hospitalization was complicated by alcohol withdrawal syndrome and atrial fibrillation with RVR.  After his withdrawal symptoms were controlled, patient underwent further work up with upper endoscopy and colonoscopy, finding non bleeding gastric ulcer and hemorrhoids.  Plan to follow up as outpatient.    Acute blood loss anemia due to lower gastrointestinal bleed, hemorrhoidal bleed. Non-bleeding gastric ulcer/ acute gastritis.  Patient had a prolonged hospitalization, because of alcohol withdrawal syndrome. Patient was treated with proton pump inhibitors and had frequent hemoglobin and hematocrit checks. He did not required PRBC transfusion, his discharge hemoglobin is 12.4 and hematocrit 35.9.  Upper endoscopy showed small hiatal hernia, grade B reflux esophagitis with no bleeding, nonbleeding gastric ulcer with no stigmata of bleeding.  Positive gastritis.  Biopsies were taken. Recommendations to avoid nonsteroidal anti-inflammatory drugs and continue pantoprazole 40 mg daily.  Follow-up as an outpatient for pathology results.  Colonoscopy shows 7 to 11 mm polyps in the ascending colon, removed with a cold snare.  Nonbleeding external and internal hemorrhoids.  Likely source of hematochezia.  2.  Acute alcohol withdrawal syndrome/ ambulatory dysfunction/ elevated liver enzymes.  Patient developed withdrawal symptoms, he required benzodiazepine/lorazepam per CIWA protocol. Had supportive intravenous fluids and frequent neurochecks. By the time of his discharge his withdrawal symptoms have resolved. Patient has been advised about alcohol cessation.    3.  Atrial fibrillation, paroxysmal.  While patient having withdrawal symptoms he developed atrial fibrillation with rapid ventricular response.  He was placed on AV blockade converting to sinus rhythm. Patient will continue taking metoprolol 25 g twice daily for rate control, currently anticoagulation on hold  due to risk of falls/alcohol abuse. If patient remains sober and for risk decreases to consider starting anticoagulation. Patient will  follow-up with cardiology as an outpatient.  4.  Type 2 diabetes mellitus.  Dyslipidemia.  Patient was placed on insulin sliding scale for glucose coverage and monitoring during his hospitalization. Continue statin therapy at discharge. For his diabetic neuropathy we will change gabapentin to pregabalin.  5.  Hypokalemia/hypomagnesemia.  His electrolytes were corrected with no major complications. At discharge sodium 138, potassium 3.8, chloride 109, bicarb 20, glucose 89, BUN less than 5.  Magnesium 1.2, received IV magnesium sulfate before discharge.  6.  Hypertension/ CAD/ sp aortofemoral bypass in 2010 then orbilta atherectomy and drug coated balloon angioplasty to the ostial/proximal right SFA in 12/2017 .  Continue losartan for blood pressure control. Resume aspirin and clopidogrel per home regimen.   7. Right elbow cellulitis. Had doxycycline, follow up as outpatient.   8. COPD. No signs of exacerbation.     Discharge Diagnoses:  Principal Problem:   GIB (gastrointestinal bleeding) Active Problems:   Essential hypertension   Moderate COPD (chronic obstructive pulmonary disease) (HCC)   Peripheral vascular disease (HCC)   GERD (gastroesophageal reflux disease)   Type 2 diabetes mellitus with peripheral neuropathy (HCC)   Alcohol withdrawal delirium (HCC)   Atrial fibrillation with RVR (HCC)   Melena   Hematochezia   Polyp of ascending colon    Discharge Instructions   Allergies as of 11/22/2020       Reactions   Lisinopril Hives, Itching   Amitriptyline Other (See Comments)   Makes pt feel "weird" and nausea.   Contrast Media [iodinated Diagnostic Agents] Hives, Itching   Pt can use if taking benadryl    Ibuprofen Other (See Comments)   Internal bleeding        Medication List     STOP taking these medications     gabapentin 300 MG capsule Commonly known as: NEURONTIN   omeprazole 40 MG capsule Commonly known as: PRILOSEC       TAKE these medications    acetaminophen 500 MG tablet Commonly known as: TYLENOL Take 1,000 mg by mouth every 6 (six) hours as needed for mild pain.   albuterol (2.5 MG/3ML) 0.083% nebulizer solution Commonly known as: PROVENTIL Take 3 mLs (2.5 mg total) by nebulization every 6 (six) hours as needed for wheezing or shortness of breath. What changed: Another medication with the same name was changed. Make sure you understand how and when to take each.   albuterol 108 (90 Base) MCG/ACT inhaler Commonly known as: ProAir HFA USE 2 PUFFS EVERY 6 HOURS  AS NEEDED FOR WHEEZING OR  SHORTNESS OF BREATH What changed:  how much to take how to take this when to take this reasons to take this additional instructions   aspirin EC 81 MG tablet Take 81 mg by mouth daily.   atorvastatin 40 MG tablet Commonly known as: LIPITOR Take 1 tablet (40 mg total) by mouth daily.   clopidogrel 75 MG tablet Commonly known as: PLAVIX Take 1 tablet (75 mg total) by mouth daily.   colchicine 0.6 MG tablet TAKE 2 TABLETS BY MOUTH AS  DIRECTED THEN 1 TABLET  AFTER 1 HOUR. CAN TAKE 1  TABLET TWICE DAILY  THEREAFTER. What changed:  how much to take how to take this when to take this reasons to take this additional instructions   fexofenadine 180 MG tablet Commonly known as: Allegra Allergy Take 1 tablet (180 mg total) by mouth daily.   hydrocortisone 25 MG suppository Commonly known as: ANUSOL-HC Place 1 suppository (25 mg total) rectally at  bedtime.   losartan 25 MG tablet Commonly known as: COZAAR TAKE 1 TABLET BY MOUTH  DAILY   methocarbamol 500 MG tablet Commonly known as: ROBAXIN Take 500 mg by mouth every 6 (six) hours as needed for muscle spasms.   metoprolol tartrate 25 MG tablet Commonly known as: LOPRESSOR Take 1 tablet (25 mg total) by mouth 2 (two) times  daily.   ONE TOUCH LANCETS Misc 1 each by Does not apply route 3 (three) times daily.   OneTouch Ultra test strip Generic drug: glucose blood USE TO TEST 3 TIMES DAILY   OneTouch Verio w/Device Kit 1 Device by Does not apply route once.   OVER THE COUNTER MEDICATION Apply 1 application topically daily as needed (neuropathy pain). Hemp oil cream   pantoprazole 40 MG tablet Commonly known as: PROTONIX Take 1 tablet (40 mg total) by mouth 2 (two) times daily before a meal.   pregabalin 25 MG capsule Commonly known as: LYRICA Take 1 capsule (25 mg total) by mouth daily.   traZODone 100 MG tablet Commonly known as: DESYREL TAKE 1 TABLET BY MOUTH AT  BEDTIME What changed:  when to take this reasons to take this   Trelegy Ellipta 100-62.5-25 MCG/INH Aepb Generic drug: Fluticasone-Umeclidin-Vilant Inhale 1 puff into the lungs daily.        Allergies  Allergen Reactions   Lisinopril Hives and Itching   Amitriptyline Other (See Comments)    Makes pt feel "weird" and nausea.   Contrast Media [Iodinated Diagnostic Agents] Hives and Itching    Pt can use if taking benadryl    Ibuprofen Other (See Comments)    Internal bleeding    Consultations: Cardiology  GI    Procedures/Studies: DG Tibia/Fibula Right  Result Date: 11/15/2020 CLINICAL DATA:  Fall with unresolved like pain EXAM: RIGHT TIBIA AND FIBULA - 2 VIEW COMPARISON:  None. FINDINGS: There is no evidence of fracture or other focal bone lesions. Soft tissues are unremarkable. Vascular calcification IMPRESSION: Negative. Electronically Signed   By: Donavan Foil M.D.   On: 11/15/2020 20:01   DG Chest Port 1 View  Result Date: 11/14/2020 CLINICAL DATA:  Weakness EXAM: PORTABLE CHEST 1 VIEW COMPARISON:  CT chest dated Aug 06, 2019 FINDINGS: Cardiac and mediastinal contours are unchanged and within normal limits. Bandlike consolidation of the right upper lobe. Lungs are otherwise clear. No evidence pleural effusion or  pneumothorax. IMPRESSION: Bandlike consolidation of the right upper lobe, likely posttreatment changes. Lungs are otherwise clear. Electronically Signed   By: Yetta Glassman M.D.   On: 11/14/2020 12:13   ECHOCARDIOGRAM COMPLETE  Result Date: 11/15/2020    ECHOCARDIOGRAM REPORT   Patient Name:   DAVARIOUS TUMBLESON Koeppen Date of Exam: 11/15/2020 Medical Rec #:  [id number]          Height:       69.0 in Accession #:    [id number]         Weight:       176.0 lb Date of Birth:  Jul 17, 1955          BSA:          1.957 m Patient Age:    65 years           BP:           159/80 mmHg Patient Gender: M                  HR:  95 bpm. Exam Location:  Inpatient Procedure: 2D Echo, Color Doppler and Cardiac Doppler Indications:    I48.91* Unspecified atrial fibrillation  History:        Patient has no prior history of Echocardiogram examinations.                 COPD; Risk Factors:Hypertension, Diabetes and Dyslipidemia.  Sonographer:    Raquel Sarna Senior RDCS Referring Phys: [id number] [place]  1. Left ventricular ejection fraction, by estimation, is 60 to 65%. The left ventricle has normal function. The left ventricle has no regional wall motion abnormalities. Left ventricular diastolic parameters were normal.  2. Right ventricular systolic function is normal. The right ventricular size is normal. Tricuspid regurgitation signal is inadequate for assessing PA pressure.  3. The mitral valve is grossly normal. Trivial mitral valve regurgitation. No evidence of mitral stenosis.  4. The aortic valve was not well visualized. Aortic valve regurgitation is not visualized. No aortic stenosis is present.  5. The inferior vena cava is normal in size with greater than 50% respiratory variability, suggesting right atrial pressure of 3 mmHg. Comparison(s): No prior Echocardiogram. Conclusion(s)/Recommendation(s): Normal biventricular function without evidence of hemodynamically significant valvular heart disease. FINDINGS   Left Ventricle: Left ventricular ejection fraction, by estimation, is 60 to 65%. The left ventricle has normal function. The left ventricle has no regional wall motion abnormalities. The left ventricular internal cavity size was normal in size. There is  no left ventricular hypertrophy. Left ventricular diastolic parameters were normal. Right Ventricle: The right ventricular size is normal. Right vetricular wall thickness was not well visualized. Right ventricular systolic function is normal. Tricuspid regurgitation signal is inadequate for assessing PA pressure. Left Atrium: Left atrial size was normal in size. Right Atrium: Right atrial size was normal in size. Pericardium: There is no evidence of pericardial effusion. Presence of pericardial fat pad. Mitral Valve: The mitral valve is grossly normal. Trivial mitral valve regurgitation. No evidence of mitral valve stenosis. Tricuspid Valve: The tricuspid valve is grossly normal. Tricuspid valve regurgitation is not demonstrated. No evidence of tricuspid stenosis. Aortic Valve: The aortic valve was not well visualized. Aortic valve regurgitation is not visualized. No aortic stenosis is present. Pulmonic Valve: The pulmonic valve was not well visualized. Pulmonic valve regurgitation is not visualized. No evidence of pulmonic stenosis. Aorta: The aortic root and ascending aorta are structurally normal, with no evidence of dilitation and the aortic arch was not well visualized. Venous: The inferior vena cava is normal in size with greater than 50% respiratory variability, suggesting right atrial pressure of 3 mmHg. IAS/Shunts: The atrial septum is grossly normal.  LEFT VENTRICLE PLAX 2D LVIDd:         4.90 cm  Diastology LVIDs:         3.20 cm  LV e' medial:    6.00 cm/s LV PW:         0.80 cm  LV E/e' medial:  12.5 LV IVS:        0.80 cm  LV e' lateral:   7.00 cm/s LVOT diam:     2.10 cm  LV E/e' lateral: 10.7 LV SV:         67 LV SV Index:   34 LVOT Area:     3.46  cm  RIGHT VENTRICLE RV S prime:     12.50 cm/s TAPSE (M-mode): 2.3 cm LEFT ATRIUM             Index  RIGHT ATRIUM           Index LA diam:        3.80 cm 1.94 cm/m  RA Area:     16.60 cm LA Vol (A2C):   27.9 ml 14.26 ml/m RA Volume:   39.60 ml  20.24 ml/m LA Vol (A4C):   34.2 ml 17.48 ml/m LA Biplane Vol: 32.1 ml 16.40 ml/m  AORTIC VALVE LVOT Vmax:   118.00 cm/s LVOT Vmean:  74.000 cm/s LVOT VTI:    0.192 m  AORTA Ao Root diam: 3.60 cm Ao Asc diam:  3.40 cm MV E velocity: 75.00 cm/s MV A velocity: 82.00 cm/s  SHUNTS MV E/A ratio:  0.91        Systemic VTI:  0.19 m                            Systemic Diam: 2.10 cm Buford Dresser MD Electronically signed by Buford Dresser MD Signature Date/Time: 11/15/2020/5:56:57 PM    Final    VAS [place] UPPER EXTREMITY VENOUS DUPLEX  Result Date: 11/20/2020 UPPER VENOUS STUDY  Patient Name:  TRELON PLUSH Pautz  Date of Exam:   11/20/2020 Medical Rec #: [id number]           Accession #:    [id number] Date of Birth: 11-14-1955           Patient Gender: M Patient Age:   58 years Exam Location:  [facility] Procedure:      VAS [place] UPPER EXTREMITY VENOUS DUPLEX Referring Phys: Shawna Clamp --------------------------------------------------------------------------------  Indications: Swelling Risk Factors: None identified. Comparison Study: No prior studies. Performing Technologist: Oliver Hum RVT  Examination Guidelines: A complete evaluation includes B-mode imaging, spectral Doppler, color Doppler, and power Doppler as needed of all accessible portions of each vessel. Bilateral testing is considered an integral part of a complete examination. Limited examinations for reoccurring indications may be performed as noted.  Right Findings: +----------+------------+---------+-----------+----------+-------+ RIGHT     CompressiblePhasicitySpontaneousPropertiesSummary +----------+------------+---------+-----------+----------+-------+ IJV           Full        Yes       Yes                      +----------+------------+---------+-----------+----------+-------+ Subclavian    Full       Yes       Yes                      +----------+------------+---------+-----------+----------+-------+ Axillary      Full       Yes       Yes                      +----------+------------+---------+-----------+----------+-------+ Brachial      Full       Yes       Yes                      +----------+------------+---------+-----------+----------+-------+ Radial        Full                                          +----------+------------+---------+-----------+----------+-------+ Ulnar         Full                                          +----------+------------+---------+-----------+----------+-------+  Cephalic      Full                                          +----------+------------+---------+-----------+----------+-------+ Basilic       Full                                          +----------+------------+---------+-----------+----------+-------+  Left Findings: +----------+------------+---------+-----------+----------+-------+ LEFT      CompressiblePhasicitySpontaneousPropertiesSummary +----------+------------+---------+-----------+----------+-------+ Subclavian    Full       Yes       Yes                      +----------+------------+---------+-----------+----------+-------+  Summary:  Right: No evidence of deep vein thrombosis in the upper extremity. No evidence of superficial vein thrombosis in the upper extremity.  Left: No evidence of thrombosis in the subclavian.  *See table(s) above for measurements and observations.  Diagnosing physician: Deitra Mayo MD Electronically signed by Deitra Mayo MD on 11/20/2020 at 7:55:05 PM.    Final      Procedures: upper endoscopy and colonoscopy   Subjective: Patient feeling well, no nausea or vomiting, no dyspnea or chest pain, no abdominal pain or  bleeding. No tremors or agitation.   Discharge Exam: Vitals:   11/22/20 0805 11/22/20 0807  BP:    Pulse:    Resp:    Temp:    SpO2: 98% 98%   Vitals:   11/22/20 0441 11/22/20 0441 11/22/20 0805 11/22/20 0807  BP: (!) 151/81 (!) 151/81    Pulse:  70    Resp: 18 18    Temp: 98 F (36.7 C) 98 F (36.7 C)    TempSrc: Oral Oral    SpO2: 98% 98% 98% 98%  Weight:      Height:        General: Not in pain or dyspnea  Neurology: Awake and alert, non focal  E ENT: no pallor, no icterus, oral mucosa moist Cardiovascular: No JVD. S1-S2 present, rhythmic, no gallops, rubs, or murmurs. No lower extremity edema. Pulmonary: positive breath sounds bilaterally, adequate air movement, no wheezing, rhonchi or rales. Gastrointestinal. Abdomen soft and non tender Skin. No rashes Musculoskeletal: no joint deformities   The results of significant diagnostics from this hospitalization (including imaging, microbiology, ancillary and laboratory) are listed below for reference.     Microbiology: Recent Results (from the past 240 hour(s))  Resp Panel by RT-PCR (Flu A&B, Covid) Nasopharyngeal Swab     Status: None   Collection Time: 11/14/20 11:30 AM   Specimen: Nasopharyngeal Swab; Nasopharyngeal([name]) swabs in vial transport medium  Result Value Ref Range Status   SARS Coronavirus 2 by RT PCR NEGATIVE NEGATIVE Final    Comment: (NOTE) SARS-CoV-2 target nucleic acids are NOT DETECTED.  The SARS-CoV-2 RNA is generally detectable in upper respiratory specimens during the acute phase of infection. The lowest concentration of SARS-CoV-2 viral copies this assay can detect is 138 copies/mL. A negative result does not preclude SARS-Cov-2 infection and should not be used as the sole basis for treatment or other patient management decisions. A negative result may occur with  improper specimen collection/handling, submission of specimen other than nasopharyngeal swab, presence of viral mutation(s)  within the areas targeted by this assay, and inadequate number of  viral copies(<138 copies/mL). A negative result must be combined with clinical observations, patient history, and epidemiological information. The expected result is Negative.  Fact Sheet for Patients:  EntrepreneurPulse.com.au  Fact Sheet for Healthcare Providers:  IncredibleEmployment.be  This test is no t yet approved or cleared by the [place] FDA and  has been authorized for detection and/or diagnosis of SARS-CoV-2 by FDA under an Emergency Use Authorization (EUA). This EUA will remain  in effect (meaning this test can be used) for the duration of the COVID-19 declaration under Section 564(b)(1) of the Act, 21 U.S.C.section 360bbb-3(b)(1), unless the authorization is terminated  or revoked sooner.       Influenza A by PCR NEGATIVE NEGATIVE Final   Influenza B by PCR NEGATIVE NEGATIVE Final    Comment: (NOTE) The Xpert Xpress SARS-CoV-2/FLU/RSV plus assay is intended as an aid in the diagnosis of influenza from Nasopharyngeal swab specimens and should not be used as a sole basis for treatment. Nasal washings and aspirates are unacceptable for Xpert Xpress SARS-CoV-2/FLU/RSV testing.  Fact Sheet for Patients: EntrepreneurPulse.com.au  Fact Sheet for Healthcare Providers: IncredibleEmployment.be  This test is not yet approved or cleared by the [place] FDA and has been authorized for detection and/or diagnosis of SARS-CoV-2 by FDA under an Emergency Use Authorization (EUA). This EUA will remain in effect (meaning this test can be used) for the duration of the COVID-19 declaration under Section 564(b)(1) of the Act, 21 U.S.C. section 360bbb-3(b)(1), unless the authorization is terminated or revoked.  Performed at [facility], [place] [address] [place], [place] [address]      Labs: BNP (last 3  results) No results for input(s): BNP in the last 8760 hours. Basic Metabolic Panel: Recent Labs  Lab 11/16/20 0809 11/17/20 1444 11/18/20 0520 11/19/20 0429 11/20/20 0409 11/21/20 0418  NA 140 142 140 137 137 138  K 3.3* 3.2* 3.6 3.0* 3.8 3.8  CL 103 104 106 106 109 109  CO2 _0 20*  GLUCOSE 103* 84 83 100* 91 89  BUN 7* 6* 7* 8 6* <5*  CREATININE 0.66 0.61 0.49* 0.65 0.59* 0.67  CALCIUM 8.9 9.2 8.9 8.6* 8.5* 8.8*  MG 1.6* 1.4* 1.3* 1.5*  --  1.2*  PHOS 2.4* 3.9  --  3.8  --  4.0   Liver Function Tests: Recent Labs  Lab 11/16/20 0809 11/17/20 1444 11/19/20 0429 11/21/20 0418  AST 95* 106* 50* 49*  ALT 53* 59* 39 39  ALKPHOS 158* 167* 132* 134*  BILITOT 1.2 1.3* 0.7 0.8  PROT 6.9 7.1 5.8* 5.9*  ALBUMIN 3.5 3.5 2.8* 3.0*   No results for input(s): LIPASE, AMYLASE in the last 168 hours. No results for input(s): AMMONIA in the last 168 hours. CBC: Recent Labs  Lab 11/16/20 0809 11/17/20 1444 11/19/20 0429 11/20/20 0409 11/21/20 0418  WBC 6.0 7.4  --  5.7 6.1  HGB 13.8 13.8 12.8* 11.8* 12.4*  HCT 39.3 38.2* 35.4* 33.5* 35.9*  MCV 105.6* 104.9*  --  105.3* 107.2*  PLT 71* 92*  --  142* 185   Cardiac Enzymes: No results for input(s): CKTOTAL, CKMB, CKMBINDEX, TROPONINI in the last 168 hours. BNP: Invalid input(s): POCBNP CBG: Recent Labs  Lab 11/16/20 1201 11/16/20 1210 11/16/20 1703 11/20/20 1216 11/21/20 0658  GLUCAP 98 97 88 104* 92   D-Dimer No results for input(s): DDIMER in the last 72 hours. Hgb A1c No results for input(s): HGBA1C in the last 72 hours. Lipid  Profile No results for input(s): CHOL, HDL, LDLCALC, TRIG, CHOLHDL, LDLDIRECT in the last 72 hours. Thyroid function studies No results for input(s): TSH, T4TOTAL, T3FREE, THYROIDAB in the last 72 hours.  Invalid input(s): FREET3 Anemia work up No results for input(s): VITAMINB12, FOLATE, FERRITIN, TIBC, IRON, RETICCTPCT in the last 72 hours. Urinalysis    Component Value  Date/Time   BILIRUBINUR Negative 10/13/2019 1445   PROTEINUR Positive (A) 10/13/2019 1445   UROBILINOGEN 1.0 10/13/2019 1445   NITRITE Negative 10/13/2019 1445   LEUKOCYTESUR Negative 10/13/2019 1445   Sepsis Labs Invalid input(s): PROCALCITONIN,  WBC,  LACTICIDVEN Microbiology Recent Results (from the past 240 hour(s))  Resp Panel by RT-PCR (Flu A&B, Covid) Nasopharyngeal Swab     Status: None   Collection Time: 11/14/20 11:30 AM   Specimen: Nasopharyngeal Swab; Nasopharyngeal([name]) swabs in vial transport medium  Result Value Ref Range Status   SARS Coronavirus 2 by RT PCR NEGATIVE NEGATIVE Final    Comment: (NOTE) SARS-CoV-2 target nucleic acids are NOT DETECTED.  The SARS-CoV-2 RNA is generally detectable in upper respiratory specimens during the acute phase of infection. The lowest concentration of SARS-CoV-2 viral copies this assay can detect is 138 copies/mL. A negative result does not preclude SARS-Cov-2 infection and should not be used as the sole basis for treatment or other patient management decisions. A negative result may occur with  improper specimen collection/handling, submission of specimen other than nasopharyngeal swab, presence of viral mutation(s) within the areas targeted by this assay, and inadequate number of viral copies(<138 copies/mL). A negative result must be combined with clinical observations, patient history, and epidemiological information. The expected result is Negative.  Fact Sheet for Patients:  EntrepreneurPulse.com.au  Fact Sheet for Healthcare Providers:  IncredibleEmployment.be  This test is no t yet approved or cleared by the [place] FDA and  has been authorized for detection and/or diagnosis of SARS-CoV-2 by FDA under an Emergency Use Authorization (EUA). This EUA will remain  in effect (meaning this test can be used) for the duration of the COVID-19 declaration under Section 564(b)(1) of the  Act, 21 U.S.C.section 360bbb-3(b)(1), unless the authorization is terminated  or revoked sooner.       Influenza A by PCR NEGATIVE NEGATIVE Final   Influenza B by PCR NEGATIVE NEGATIVE Final    Comment: (NOTE) The Xpert Xpress SARS-CoV-2/FLU/RSV plus assay is intended as an aid in the diagnosis of influenza from Nasopharyngeal swab specimens and should not be used as a sole basis for treatment. Nasal washings and aspirates are unacceptable for Xpert Xpress SARS-CoV-2/FLU/RSV testing.  Fact Sheet for Patients: EntrepreneurPulse.com.au  Fact Sheet for Healthcare Providers: IncredibleEmployment.be  This test is not yet approved or cleared by the [place] FDA and has been authorized for detection and/or diagnosis of SARS-CoV-2 by FDA under an Emergency Use Authorization (EUA). This EUA will remain in effect (meaning this test can be used) for the duration of the COVID-19 declaration under Section 564(b)(1) of the Act, 21 U.S.C. section 360bbb-3(b)(1), unless the authorization is terminated or revoked.  Performed at [facility], [place] [address] [place], [place] [address]      Time coordinating discharge: 45 minutes  SIGNED:   Tawni Millers, MD  [facility] 11/22/2020, 10:17 AM

## 2020-11-22 NOTE — Progress Notes (Signed)
Pt continues to refuse labs. MD Arrien made aware.

## 2020-12-07 ENCOUNTER — Other Ambulatory Visit: Payer: Self-pay | Admitting: Internal Medicine

## 2020-12-07 ENCOUNTER — Encounter: Payer: Self-pay | Admitting: Gastroenterology

## 2020-12-07 ENCOUNTER — Encounter: Payer: Self-pay | Admitting: Family Medicine

## 2020-12-07 ENCOUNTER — Ambulatory Visit: Payer: Medicare Other | Attending: Family Medicine | Admitting: Family Medicine

## 2020-12-07 ENCOUNTER — Other Ambulatory Visit: Payer: Self-pay

## 2020-12-07 VITALS — BP 172/95 | HR 75 | Resp 16 | Ht 68.0 in | Wt 183.0 lb

## 2020-12-07 DIAGNOSIS — O2200 Varicose veins of lower extremity in pregnancy, unspecified trimester: Secondary | ICD-10-CM

## 2020-12-07 DIAGNOSIS — I2510 Atherosclerotic heart disease of native coronary artery without angina pectoris: Secondary | ICD-10-CM

## 2020-12-07 DIAGNOSIS — I739 Peripheral vascular disease, unspecified: Secondary | ICD-10-CM | POA: Diagnosis not present

## 2020-12-07 DIAGNOSIS — I83893 Varicose veins of bilateral lower extremities with other complications: Secondary | ICD-10-CM | POA: Diagnosis not present

## 2020-12-07 DIAGNOSIS — E1159 Type 2 diabetes mellitus with other circulatory complications: Secondary | ICD-10-CM

## 2020-12-07 DIAGNOSIS — F17210 Nicotine dependence, cigarettes, uncomplicated: Secondary | ICD-10-CM | POA: Diagnosis not present

## 2020-12-07 DIAGNOSIS — I4891 Unspecified atrial fibrillation: Secondary | ICD-10-CM | POA: Diagnosis not present

## 2020-12-07 DIAGNOSIS — E1142 Type 2 diabetes mellitus with diabetic polyneuropathy: Secondary | ICD-10-CM | POA: Diagnosis not present

## 2020-12-07 DIAGNOSIS — M7021 Olecranon bursitis, right elbow: Secondary | ICD-10-CM

## 2020-12-07 DIAGNOSIS — Z720 Tobacco use: Secondary | ICD-10-CM

## 2020-12-07 DIAGNOSIS — I152 Hypertension secondary to endocrine disorders: Secondary | ICD-10-CM | POA: Diagnosis not present

## 2020-12-07 LAB — POCT GLYCOSYLATED HEMOGLOBIN (HGB A1C): HbA1c, POC (controlled diabetic range): 5.4 % (ref 0.0–7.0)

## 2020-12-07 MED ORDER — PREGABALIN 100 MG PO CAPS: 100.0000 mg | ORAL_CAPSULE | Freq: Every day | ORAL | 6 refills | Status: DC

## 2020-12-07 MED ORDER — DULOXETINE HCL 60 MG PO CPEP: 60.0000 mg | ORAL_CAPSULE | Freq: Every day | ORAL | 6 refills | Status: DC

## 2020-12-07 MED ORDER — PREGABALIN 100 MG PO CAPS: 100.0000 mg | ORAL_CAPSULE | Freq: Every day | ORAL | 5 refills | Status: DC

## 2020-12-07 MED ORDER — CARVEDILOL 12.5 MG PO TABS: 12.5000 mg | ORAL_TABLET | Freq: Two times a day (BID) | ORAL | 3 refills | Status: DC

## 2020-12-07 NOTE — Telephone Encounter (Signed)
As per Vivien Rota from [facility] requesting pregabalin (LYRICA) 100 MG capsule and DULoxetine (CYMBALTA) 60 MG capsule resent the e scribe through as in valid

## 2020-12-07 NOTE — Progress Notes (Signed)
Subjective:  Patient ID: Joshua [name], male    DOB: Jan 22, 1956  Age: 65 y.o. MRN: [id number]  CC: New Patient (Initial Visit) and Medication Refill   HPI Joshua [name] is a 65 y.o. 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 Had bone spurs in lumbar region s/p surgery several years ago. He presents today to establish care and is accompanied by his wife.  Hospitalized 1 month ago for lower GI bleed.EGD revealed gastritis, hiatal hernia, nonbleeding peptic ulcers.  Colonoscopy revealed 2 sessile polyps which were removed, pathology revealed tubular adenoma, no high-grade dysplasia.  He was discharged on a PPI.  Interval History: He has had no GI bleed since discharge. 2 weeks ago he developed R elbow swelling which is not tender with no preceding history of trauma.  Feet hurt since he ran out of Lyrica.  Was previously on 100 mg 3 times daily but states that discharge clinician had placed him on 25 mg twice daily which has been ineffective.  With regards to his CAD he has not been to see cardiology in a while.  Denies presence of chest pain or dyspnea.  He has an upcoming appointment with cardiology on 12/18/2020. COPD is managed by pulmonary and he is on his inhalers but continues to smoke half a pack of cigarettes per day.  Not ready to quit. Oncology visit for surveillance of right non-small cell lung cancer occurs every 6 months.  BP is elevated. At home runs in the 537 systolic and he endorses compliance with his medications.  Just smoked prior to coming here. Past Medical History:  Diagnosis Date   AC (acromioclavicular) joint bone spurs, unspecified laterality 11/25/2016   Arthritis    "back" (05/16/2015)   CAD in native artery, occluded Lcx  and rotational atherectomy to LAD with DES 05/17/15 05/18/2015   Cavitary lesion of lung 02/25/2018   Chronic back pain    "all my back"   Chronic bronchitis (HCC)    COPD (chronic obstructive pulmonary disease) ([place])    Diabetes mellitus without complication (HCC)    GERD (gastroesophageal reflux disease)    H/O blood clots    "had them in my back; put filter in before one of my neck ORs"   History of gout    Hyperlipidemia    Hypertension    Neuromuscular disorder ([place])    PAD (peripheral artery disease) (HCC)    Phlebitis    Positive TB test    Seasonal allergies    Shortness of breath    Spinal disease     Past Surgical History:  Procedure Laterality Date   ABDOMINAL AORTOGRAM W/LOWER EXTREMITY  01/07/2018   ABDOMINAL AORTOGRAM W/LOWER EXTREMITY N/A 01/07/2018   Procedure: ABDOMINAL AORTOGRAM W/LOWER EXTREMITY;  Surgeon: Wellington Hampshire, MD;  Location: [facility];  Service: Cardiovascular;  Laterality: N/A;   ANTERIOR CERVICAL DECOMP/DISCECTOMY FUSION  ~ 2001-2009 X 3   ANTERIOR CERVICAL DECOMP/DISCECTOMY FUSION N/A 10/06/2012   Procedure: ANTERIOR CERVICAL DECOMPRESSION/DISCECTOMY FUSION 1 LEVEL;  Surgeon: [name], MD;  Location: [facility];  Service: Neurosurgery;  Laterality: N/A;  C7T1 anterior cervical decompression with fusion plating and bonegraft    BACK SURGERY     BIOPSY  11/21/2020   Procedure: BIOPSY;  Surgeon: Mauri Pole, MD;  Location: [facility];  Service: Endoscopy;;   CARDIAC CATHETERIZATION  05/16/2015   CARDIAC CATHETERIZATION N/A 05/16/2015   Procedure: Left Heart Cath and Coronary Angiography;  Surgeon: [name], MD;  Location: [facility];  Service: Cardiovascular;  Laterality: N/A;   CARDIAC CATHETERIZATION N/A 05/16/2015   Procedure: Coronary Balloon Angioplasty;  Surgeon: [name], MD;  Location: [facility];  Service: Cardiovascular;  Laterality: N/A;   CARDIAC CATHETERIZATION N/A 05/17/2015    Procedure: Coronary Stent Intervention Rotoblater;  Surgeon: [name], MD;  Location: [facility];  Service: Cardiovascular;  Laterality: N/A;   COLONOSCOPY WITH PROPOFOL N/A 11/21/2020   Procedure: COLONOSCOPY WITH PROPOFOL;  Surgeon: Mauri Pole, MD;  [facility];  Service: Endoscopy;  Laterality: N/A;   ELBOW SURGERY Left 2016   "had bone pieces removed"   ESOPHAGEAL MANOMETRY N/A 12/30/2016   Procedure: ESOPHAGEAL MANOMETRY (EM);  Surgeon: Irene Shipper, MD;  [facility];  Service: Endoscopy;  Laterality: N/A;   ESOPHAGOGASTRODUODENOSCOPY (EGD) WITH PROPOFOL N/A 11/21/2020   Procedure: ESOPHAGOGASTRODUODENOSCOPY (EGD) WITH PROPOFOL;  Surgeon: Mauri Pole, MD;  [facility];  Service: Endoscopy;  Laterality: N/A;   EXCISIONAL HEMORRHOIDECTOMY     FEMORAL BYPASS  ~ 2010   IR RADIOLOGIST EVAL & MGMT  12/17/2016   IVC FILTER PLACEMENT ([place] HX)  2010   "had blood clots in my back"   NECK SURGERY  2014   2008-2014   PERIPHERAL VASCULAR ATHERECTOMY  01/07/2018   Procedure: PERIPHERAL VASCULAR ATHERECTOMY;  Surgeon: Wellington Hampshire, MD;  Location: [facility];  Service: Cardiovascular;;   PERIPHERAL VASCULAR BALLOON ANGIOPLASTY  01/07/2018   Procedure: PERIPHERAL VASCULAR BALLOON ANGIOPLASTY;  Surgeon: Wellington Hampshire, MD;  Location: [facility];  Service: Cardiovascular;;   PERIPHERAL VASCULAR CATHETERIZATION N/A 02/14/2016   Procedure: Abdominal Aortogram w/Lower Extremity;  Surgeon: Wellington Hampshire, MD;  Location: [facility];  Service: Cardiovascular;  Laterality: N/A;   POLYPECTOMY  11/21/2020   Procedure: POLYPECTOMY;  Surgeon: Mauri Pole, MD;  [facility];  Service: Endoscopy;;   POSTERIOR LAMINECTOMY / DECOMPRESSION LUMBAR SPINE  2006   "bone spurs"   VIDEO BRONCHOSCOPY WITH ENDOBRONCHIAL NAVIGATION Right 02/25/2018   Procedure: VIDEO BRONCHOSCOPY WITH ENDOBRONCHIAL NAVIGATION;   Surgeon: Garner Nash, DO;  Location: [facility];  Service: Thoracic;  Laterality: Right;   VIDEO BRONCHOSCOPY WITH ENDOBRONCHIAL ULTRASOUND Right 02/25/2018   Procedure: VIDEO BRONCHOSCOPY WITH ENDOBRONCHIAL ULTRASOUND;  Surgeon: Garner Nash, DO;  Location: [facility];  Service: Thoracic;  Laterality: Right;    Family History  Problem Relation Age of Onset   Stomach cancer Mother        Deceased, 9s   Hypertension Mother    Hypertension Father    Stroke Father    Heart attack Father        Deceased, 74   Aneurysm Brother    Healthy Daughter    Healthy Maternal Grandmother    Leukemia Grandchild    Diabetes Neg Hx    Early death Neg Hx    Colon cancer Neg Hx    Pancreatic cancer Neg Hx     Allergies  Allergen Reactions   Lisinopril Hives and Itching   Amitriptyline Other (See Comments)    Makes pt feel "weird" and nausea.   Contrast Media [Iodinated Diagnostic Agents] Hives and Itching    Pt can use if taking benadryl  Ibuprofen Other (See Comments)    Internal bleeding    Outpatient Medications Prior to Visit  Medication Sig Dispense Refill   acetaminophen (TYLENOL) 500 MG tablet Take 1,000 mg by mouth every 6 (six) hours as needed for mild pain.     albuterol (PROAIR HFA) 108 (90 Base) MCG/ACT inhaler USE 2 PUFFS EVERY 6 HOURS  AS NEEDED FOR WHEEZING OR  SHORTNESS OF BREATH (Patient taking differently: Inhale 2 puffs into the lungs every 6 (six) hours as needed for wheezing or shortness of breath.) 34 g 3   albuterol (PROVENTIL) (2.5 MG/3ML) 0.083% nebulizer solution Take 3 mLs (2.5 mg total) by nebulization every 6 (six) hours as needed for wheezing or shortness of breath. 75 mL 5   aspirin EC 81 MG tablet Take 81 mg by mouth daily.     atorvastatin (LIPITOR) 40 MG tablet Take 1 tablet (40 mg total) by mouth daily. 15 tablet 1   Blood Glucose Monitoring Suppl (ONETOUCH VERIO) w/Device KIT 1 Device by Does not apply route once. 1 kit 0   clopidogrel (PLAVIX) 75 MG  tablet Take 1 tablet (75 mg total) by mouth daily. 15 tablet 1   colchicine 0.6 MG tablet TAKE 2 TABLETS BY MOUTH AS  DIRECTED THEN 1 TABLET  AFTER 1 HOUR. CAN TAKE 1  TABLET TWICE DAILY  THEREAFTER. (Patient taking differently: Take 0.6 mg by mouth 2 (two) times daily as needed (gout flare).) 60 tablet 11   fexofenadine (ALLEGRA ALLERGY) 180 MG tablet Take 1 tablet (180 mg total) by mouth daily. 90 tablet 3   Fluticasone-Umeclidin-Vilant (TRELEGY ELLIPTA) 100-62.5-25 MCG/INH AEPB Inhale 1 puff into the lungs daily. 2 each 5   hydrocortisone (ANUSOL-HC) 25 MG suppository Place 1 suppository (25 mg total) rectally at bedtime. 12 suppository 0   losartan (COZAAR) 25 MG tablet TAKE 1 TABLET BY MOUTH  DAILY (Patient taking differently: Take 25 mg by mouth daily.) 90 tablet 0   methocarbamol (ROBAXIN) 500 MG tablet Take 500 mg by mouth every 6 (six) hours as needed for muscle spasms.     ONE TOUCH LANCETS MISC 1 each by Does not apply route 3 (three) times daily. 200 each 5   ONETOUCH ULTRA test strip USE TO TEST 3 TIMES DAILY 300 strip 0   OVER THE COUNTER MEDICATION Apply 1 application topically daily as needed (neuropathy pain). Hemp oil cream     pantoprazole (PROTONIX) 40 MG tablet Take 1 tablet (40 mg total) by mouth 2 (two) times daily before a meal. 60 tablet 0   traZODone (DESYREL) 100 MG tablet TAKE 1 TABLET BY MOUTH AT  BEDTIME (Patient taking differently: Take 100 mg by mouth at bedtime as needed for sleep.) 90 tablet 2   metoprolol tartrate (LOPRESSOR) 25 MG tablet Take 1 tablet (25 mg total) by mouth 2 (two) times daily. 60 tablet 0   pregabalin (LYRICA) 25 MG capsule Take 1 capsule (25 mg total) by mouth daily. 30 capsule 0   No facility-administered medications prior to visit.     ROS Review of Systems  Constitutional:  Negative for activity change and appetite change.  HENT:  Negative for sinus pressure and sore throat.   Eyes:  Negative for visual disturbance.  Respiratory:   Negative for cough, chest tightness and shortness of breath.   Cardiovascular:  Negative for chest pain and leg swelling.  Gastrointestinal:  Negative for abdominal distention, abdominal pain, constipation and diarrhea.  Endocrine: Negative.   Genitourinary:  Negative for  dysuria.  Musculoskeletal:        See HPI  Skin:  Positive for color change. Negative for rash.  Allergic/Immunologic: Negative.   Neurological:  Positive for numbness. Negative for weakness and light-headedness.  Psychiatric/Behavioral:  Negative for dysphoric mood and suicidal ideas.    Objective:  BP (!) 172/95   Pulse 75   Resp 16   Ht _0  (1.727 m)   Wt 183 lb (83 kg)   SpO2 98%   BMI 27.83 kg/m   BP/Weight 12/07/2020 0/11/7351 04/26/9240  Systolic BP 683 419 -  Diastolic BP 95 81 -  Wt. (Lbs) 183 - 176  BMI 27.83 - -      Physical Exam Constitutional:      Appearance: He is well-developed.  Cardiovascular:     Rate and Rhythm: Normal rate.     Pulses:          Dorsalis pedis pulses are 0 on the right side and 0 on the left side.       Posterior tibial pulses are 0 on the right side and 0 on the left side.     Heart sounds: Normal heart sounds. No murmur heard. Pulmonary:     Effort: Pulmonary effort is normal.     Breath sounds: Normal breath sounds. No wheezing or rales.  Chest:     Chest wall: No tenderness.  Abdominal:     General: Bowel sounds are normal. There is no distension.     Palpations: Abdomen is soft. There is no mass.     Tenderness: There is no abdominal tenderness.  Musculoskeletal:        General: Normal range of motion.     Right lower leg: No edema.     Left lower leg: No edema.     Comments: Edema of the right olecranon, nontender, not warm  Skin:    Findings: Erythema (b/l lower half of legs with purplish discoloration of left foot) present.     Comments: Varicose veins bilaterally  Neurological:     Mental Status: He is alert and oriented to person, place, and  time.  Psychiatric:        Mood and Affect: Mood normal.   Simple Foot Form  12/07/2020 10:47 AM  Visual Inspection See comments: Yes Sensation Testing Intact to touch and monofilament testing bilaterally: Yes Pulse Check See comments: Yes Comments Erythematous coloration of bilateral lower extremity with purplish hue in left foot.  No ulcerations or skin breakdown Absent posterior tibialis and dorsalis pedis bilaterally      CMP Latest Ref Rng & Units 11/21/2020 11/20/2020 11/19/2020  Glucose 70 - 99 mg/dL 89 91 100(H)  BUN 8 - 23 mg/dL <5(L) 6(L) 8  Creatinine 0.61 - 1.24 mg/dL 0.67 0.59(L) 0.65  Sodium 135 - 145 mmol/L 138 137 137  Potassium 3.5 - 5.1 mmol/L 3.8 3.8 3.0(L)  Chloride 98 - 111 mmol/L 109 109 106  CO2 22 - 32 mmol/L 20(L) 24 23  Calcium 8.9 - 10.3 mg/dL 8.8(L) 8.5(L) 8.6(L)  Total Protein 6.5 - 8.1 g/dL 5.9(L) - 5.8(L)  Total Bilirubin 0.3 - 1.2 mg/dL 0.8 - 0.7  Alkaline Phos 38 - 126 U/L 134(H) - 132(H)  AST 15 - 41 U/L 49(H) - 50(H)  ALT 0 - 44 U/L 39 - 39    Lipid Panel     Component Value Date/Time   CHOL 192 07/09/2019 1435   CHOL 215 (H) 11/21/2017 0810   TRIG 79 07/09/2019  1435   HDL 101 07/09/2019 1435   HDL 126 11/21/2017 0810   CHOLHDL 1.9 07/09/2019 1435   VLDL 37.0 07/09/2018 1040   LDLCALC 75 07/09/2019 1435   LDLDIRECT 107.0 04/21/2015 1352    CBC    Component Value Date/Time   WBC 6.1 11/21/2020 0418   RBC 3.35 (L) 11/21/2020 0418   HGB 12.4 (L) 11/21/2020 0418   HGB 16.4 01/02/2018 0927   HGB 19.2 (H) 11/25/2016 1430   HCT 35.9 (L) 11/21/2020 0418   HCT 46.2 01/02/2018 0927   HCT 54.8 (H) 11/25/2016 1430   PLT 185 11/21/2020 0418   PLT 269 01/02/2018 0927   MCV 107.2 (H) 11/21/2020 0418   MCV 97 01/02/2018 0927   MCV 92.3 11/25/2016 1430   MCH 37.0 (H) 11/21/2020 0418   MCHC 34.5 11/21/2020 0418   RDW 12.8 11/21/2020 0418   RDW 13.9 01/02/2018 0927   RDW 15.0 (H) 11/25/2016 1430   LYMPHSABS 1.6 11/14/2020 1115    LYMPHSABS 2.5 11/25/2016 1430   MONOABS 0.5 11/14/2020 1115   MONOABS 1.3 (H) 11/25/2016 1430   EOSABS 0.1 11/14/2020 1115   EOSABS 0.3 11/25/2016 1430   BASOSABS 0.0 11/14/2020 1115   BASOSABS 0.0 11/25/2016 1430    Lab Results  Component Value Date   HGBA1C 5.4 12/07/2020    Assessment & Plan:  1. Type 2 diabetes mellitus with peripheral neuropathy (HCC) Diet controlled with A1c of 5.4 Continue with lifestyle modification Neuropathy is uncontrolled.  He states he was discharged on 25 mg of Lyrica but was previously on 100 mg 3 times daily.  I have restarted this at 200 mg twice daily and will add on Cymbalta to his regimen - POCT glycosylated hemoglobin (Hb A1C) - pregabalin (LYRICA) 100 MG capsule; Take 1 capsule (100 mg total) by mouth daily.  Dispense: 60 capsule; Refill: 5  2. Olecranon bursitis of right elbow Reassured Advised to apply ice No evidence of infection  3. Varicose veins of bilateral lower extremities with other complications Counseled on use of compression stockings Surgical intervention recommended at this time  4. CAD in native artery, occluded Lcx and rotational atherectomy to LAD with DES 05/17/15 Asymptomatic Strongly recommended risk factor modification including glycemic control, blood pressure control and quitting smoking He has an upcoming appointment with cardiology Continue high intensity statin  5. PAD (peripheral artery disease) ([place]) Uncontrolled Counseled that he is at significant risk of progression of disease due to ongoing smoking Advised to quit smoking Continue statin Last ABI in 01/2018 were normal bilaterally.  He is due for repeat ABI Has not an appointment with his vascular doctor  6. Atrial fibrillation with RVR (HCC) Currently in sinus rhythm He is on a beta-blocker and antiplatelet therapy not on a DOAC Continue management per cardiology  7. Tobacco abuse Spent 3 minutes counseling on smoking cessation but he is not  interested in quitting at this time  8. Hypertension associated with diabetes ([place]) Uncontrolled Increase Coreg dose We will follow-up in 1 month adjust regimen accordingly Counseled on blood pressure goal of less than 130/80, low-sodium, DASH diet, medication compliance, 150 minutes of moderate intensity exercise per week. Discussed medication compliance, adverse effects. - carvedilol (COREG) 12.5 MG tablet; Take 1 tablet (12.5 mg total) by mouth 2 (two) times daily with a meal.  Dispense: 60 tablet; Refill: 3   Health Care Maintenance: We will address at next visit. Meds ordered this encounter  Medications   DISCONTD: pregabalin (LYRICA) 100 MG  capsule    Sig: Take 1 capsule (100 mg total) by mouth daily.    Dispense:  60 capsule    Refill:  6   DISCONTD: DULoxetine (CYMBALTA) 60 MG capsule    Sig: Take 1 capsule (60 mg total) by mouth daily. For Neuropathy    Dispense:  30 capsule    Refill:  6   carvedilol (COREG) 12.5 MG tablet    Sig: Take 1 tablet (12.5 mg total) by mouth 2 (two) times daily with a meal.    Dispense:  60 tablet    Refill:  3    Discontinue Metoprolol   pregabalin (LYRICA) 100 MG capsule    Sig: Take 1 capsule (100 mg total) by mouth daily.    Dispense:  60 capsule    Refill:  5    Return in about 3 weeks (around 12/28/2020) for Evaluation of blood pressure.       Charlott Rakes, MD, FAAFP. [facility] [place], [place]   12/08/2020, 10:47 AM

## 2020-12-07 NOTE — Progress Notes (Signed)
Patient has a swollen elbow from a fall he had. He wants the Doctor to check it out.

## 2020-12-07 NOTE — Telephone Encounter (Signed)
Caller name: Bethena Roys  Relation to pt: spouse  Call back number:[phone] ( Pharmacy:   [facility]), [place] Phone:  [phone]  Fax:  [phone]      Reason for call:  Caller checking on the status of medication listed below and patient is out.   As per Tamms the prescription came through invalid therefore script has to be sent in again. Caller would like to know if covering doc can send prescription again since PCP is out of the office until Monday.

## 2020-12-07 NOTE — Patient Instructions (Signed)

## 2020-12-07 NOTE — Telephone Encounter (Signed)
Duloxetine sent. Lyrica is controlled and will therefore need to be authorized by Dr. Margarita Rana. Routing to her now.

## 2020-12-19 ENCOUNTER — Other Ambulatory Visit: Payer: Self-pay | Admitting: Family Medicine

## 2020-12-19 ENCOUNTER — Encounter: Payer: Self-pay | Admitting: Cardiovascular Disease

## 2020-12-19 ENCOUNTER — Ambulatory Visit: Payer: Medicare Other | Admitting: Cardiovascular Disease

## 2020-12-19 ENCOUNTER — Other Ambulatory Visit: Payer: Self-pay

## 2020-12-19 VITALS — BP 134/76 | HR 59 | Ht 68.5 in | Wt 185.0 lb

## 2020-12-19 DIAGNOSIS — I739 Peripheral vascular disease, unspecified: Secondary | ICD-10-CM

## 2020-12-19 DIAGNOSIS — Z01812 Encounter for preprocedural laboratory examination: Secondary | ICD-10-CM | POA: Diagnosis not present

## 2020-12-19 DIAGNOSIS — Z01818 Encounter for other preprocedural examination: Secondary | ICD-10-CM

## 2020-12-19 DIAGNOSIS — I480 Paroxysmal atrial fibrillation: Secondary | ICD-10-CM

## 2020-12-19 DIAGNOSIS — Z720 Tobacco use: Secondary | ICD-10-CM

## 2020-12-19 DIAGNOSIS — E785 Hyperlipidemia, unspecified: Secondary | ICD-10-CM | POA: Diagnosis not present

## 2020-12-19 DIAGNOSIS — I10 Essential (primary) hypertension: Secondary | ICD-10-CM

## 2020-12-19 DIAGNOSIS — I2510 Atherosclerotic heart disease of native coronary artery without angina pectoris: Secondary | ICD-10-CM | POA: Diagnosis not present

## 2020-12-19 LAB — CBC
Hematocrit: 38.9 % (ref 37.5–51.0)
Hemoglobin: 13.6 g/dL (ref 13.0–17.7)
MCH: 34.6 pg — ABNORMAL HIGH (ref 26.6–33.0)
MCHC: 35 g/dL (ref 31.5–35.7)
MCV: 99 fL — ABNORMAL HIGH (ref 79–97)
Platelets: 250 10*3/uL (ref 150–450)
RBC: 3.93 x10E6/uL — ABNORMAL LOW (ref 4.14–5.80)
RDW: 11.8 % (ref 11.6–15.4)
WBC: 12.4 10*3/uL — ABNORMAL HIGH (ref 3.4–10.8)

## 2020-12-19 LAB — BASIC METABOLIC PANEL
BUN/Creatinine Ratio: 12 (ref 10–24)
BUN: 9 mg/dL (ref 8–27)
CO2: 22 mmol/L (ref 20–29)
Calcium: 9.8 mg/dL (ref 8.6–10.2)
Chloride: 99 mmol/L (ref 96–106)
Creatinine, Ser: 0.75 mg/dL — ABNORMAL LOW (ref 0.76–1.27)
Glucose: 107 mg/dL — ABNORMAL HIGH (ref 70–99)
Potassium: 5 mmol/L (ref 3.5–5.2)
Sodium: 137 mmol/L (ref 134–144)
eGFR: 101 mL/min/{1.73_m2} (ref >59)

## 2020-12-19 MED ORDER — ONETOUCH ULTRA VI STRP: ORAL_STRIP | 0 refills | Status: DC

## 2020-12-19 NOTE — Patient Instructions (Signed)
Medication Instructions:  No changes *If you need a refill on your cardiac medications before your next appointment, please call your pharmacy*  Testing/Procedures: Your physician has requested that you have a peripheral vascular angiogram. This exam is performed at the hospital. During this exam IV contrast is used to look at arterial blood flow. Please review the information sheet given for details.  Your physician has requested that you have an Aorta/Iliac Duplex. This will be take place at [place], [address].   No food after 11PM the night before.  Water is OK. (Don't drink liquids if you have been instructed not to for ANOTHER test). Take two Extra-Strength Gas-X capsules at bedtime the night before test.   Take an additional two Extra-Strength Gas-X capsules three (3) hours before the test or first thing in the morning.   Avoid foods that produce bowel gas, for 24 hours prior to exam (see below).   No breakfast, no chewing gum, no smoking or carbonated beverages. Patient may take morning medications with water. Come in for test at least 15 minutes early to register.  Your physician has requested that you have a lower extremity arterial duplex. During this test, ultrasound is used to evaluate arterial blood flow in the legs. Allow one hour for this exam. There are no restrictions or special instructions. This will take place at [place], [address].  Your physician has requested that you have an ankle brachial index (ABI). During this test an ultrasound and blood pressure cuff are used to evaluate the arteries that supply the arms and legs with blood. Allow thirty minutes for this exam. There are no restrictions or special instructions. This will take place at [place], [address].   Follow-Up: At [facility], you and your health needs are our priority.  As part of our continuing mission to provide you with exceptional heart care, we have created  designated Provider Care Teams.  These Care Teams include your primary Cardiologist (physician) and Advanced Practice Providers (APPs -  Physician Assistants and Nurse Practitioners) who all work together to provide you with the care you need, when you need it.  We recommend signing up for the patient portal called "MyChart".  Sign up information is provided on this After Visit Summary.  MyChart is used to connect with patients for Virtual Visits (Telemedicine).  Patients are able to view lab/test results, encounter notes, upcoming appointments, etc.  Non-urgent messages can be sent to your provider as well.   To learn more about what you can do with MyChart, go to [email or web address].    Your next appointment:   Keep your post procedure follow up on 11/15 at 9:40 with Dr. Fletcher Joshua [name]  Other Instructions  [facility] CARDIOVASCULAR DIVISION [facility] [place] [address] Dept: [phone] Loc: [place]  12/19/2020  You are scheduled for a Peripheral Angiogram on Wednesday, October 12 with Dr. Kathlyn [place].  1. Please arrive at the [facility] (Main Entrance A) at [facility]: [address] [place], [place] [address] at 6:30 AM (This time is two hours before your procedure to ensure your preparation). Free valet parking service is available.   Special note: Every effort is made to have your procedure done on time. Please understand that emergencies sometimes delay scheduled procedures.  2. Diet: Do not eat solid foods after midnight.  The patient may have clear liquids until 5am upon the  day of the procedure.  3. Labs: You will need to have blood drawn today (10/4)  4. Medication instructions in preparation for your procedure: Nothing to hold   On the morning of your procedure, take your Aspirin and any morning medicines NOT listed above.  You may use sips of water.  5. Plan for one  night stay--bring personal belongings. 6. Bring a current list of your medications and current insurance cards. 7. You MUST have a responsible person to drive you home. 8. Someone MUST be with you the first 24 hours after you arrive home or your discharge will be delayed. 9. Please wear clothes that are easy to get on and off and wear slip-on shoes.  Thank you for allowing [place] to care for you!   --  Invasive Cardiovascular services

## 2020-12-19 NOTE — Telephone Encounter (Signed)
Copied from [place] [phone]. Topic: Quick Communication - Rx Refill/Question >> Dec 19, 2020  9:02 AM Yvette Rack wrote: Medication: Donald Siva test strip  Has the patient contacted their pharmacy? Yes.   (Agent: If no, request that the patient contact the pharmacy for the refill.) (Agent: If yes, when and what did the pharmacy advise?)  Preferred Pharmacy (with phone number or street name): [facility] ([facility]) - [place], [place] Phone: [phone]  Fax: [phone]  Has the patient been seen for an appointment in the last year OR does the patient have an upcoming appointment? Yes.    Agent: Please be advised that RX refills may take up to 3 business days. We ask that you follow-up with your pharmacy.

## 2020-12-19 NOTE — Progress Notes (Signed)
  Cardiology Office Note   Date:  12/20/2020   ID:  Joshua [name], DOB 05/02/1955, MRN [id number]  PCP:  Newlin, Enobong, MD  Cardiologist:  Dr. Skains  No chief complaint on file.     History of Present Illness: Joshua [name] is a 65 y.o. male who is here today for a follow up visit regarding peripheral arterial disease.  I have not seen him since 2019. He has known history of coronary artery disease status post rotational atherectomy and drug-eluting stent placement to the mid LAD. He has known history of peripheral arterial disease status post aortobifemoral bypass in 2010 at [facility], diabetes mellitus with diabetic neuropathy, hyperlipidemia and tobacco use. The patient does not have left carotid bruit but carotid Doppler showed less than 40% stenosis. He had worsening right leg claudication in 2019 and thus angiography was performed which showed widely patent aortobifemoral bypass.  There was severe calcified stenosis affecting the ostial SFA with moderate calcified disease in the mid and distal segment and three-vessel runoff below the knee.  On the left side, there was 60% ostial stenosis of the left SFA which was stable from before.  I performed orbital atherectomy and drug-coated balloon angioplasty to the ostial and proximal right SFA via the left brachial artery.  He has known history of tobacco use and heavy alcohol use.  He was hospitalized in August with suspected GI bleed and was found to be in atrial fibrillation but converted to sinus rhythm.  He was felt to be not a good anticoagulation candidate.  Echocardiogram showed normal LV systolic function.  He had EGD and colonoscopy and was told about stomach ulcers and colon polyps which were removed.  He presents now for urgent evaluation regarding severe pain affecting his right lower extremity from the hip all the way down.  The pain is at rest with continuous cramping in both legs but worse on the right than the left  side.  He has no lower extremity ulceration.  Both feet feel very cold to touch.  He reports that he quit drinking alcohol since his hospitalization.  He continues to smoke.  Past Medical History:  Diagnosis Date   AC (acromioclavicular) joint bone spurs, unspecified laterality 11/25/2016   Arthritis    "back" (05/16/2015)   CAD in native artery, occluded Lcx and rotational atherectomy to LAD with DES 05/17/15 05/18/2015   Cavitary lesion of lung 02/25/2018   Chronic back pain    "all my back"   Chronic bronchitis (HCC)    COPD (chronic obstructive pulmonary disease) (HCC)    Diabetes mellitus without complication (HCC)    GERD (gastroesophageal reflux disease)    H/O blood clots    "had them in my back; put filter in before one of my neck ORs"   History of gout    Hyperlipidemia    Hypertension    Neuromuscular disorder (HCC)    PAD (peripheral artery disease) (HCC)    Phlebitis    Positive TB test    Seasonal allergies    Shortness of breath    Spinal disease     Past Surgical History:  Procedure Laterality Date   ABDOMINAL AORTOGRAM W/LOWER EXTREMITY  01/07/2018   ABDOMINAL AORTOGRAM W/LOWER EXTREMITY N/A 01/07/2018   Procedure: ABDOMINAL AORTOGRAM W/LOWER EXTREMITY;  Surgeon: Jojuan Champney A, MD;  [facility]: [facility];  Service: Cardiovascular;  Laterality: N/A;   ANTERIOR CERVICAL DECOMP/DISCECTOMY FUSION  ~ 2001-2009 X 3   ANTERIOR CERVICAL DECOMP/DISCECTOMY   FUSION N/A 10/06/2012   Procedure: ANTERIOR CERVICAL DECOMPRESSION/DISCECTOMY FUSION 1 LEVEL;  Surgeon: Randy O Kritzer, MD;  Location: [facility] NEURO ORS;  Service: Neurosurgery;  Laterality: N/A;  C7T1 anterior cervical decompression with fusion plating and bonegraft    BACK SURGERY     BIOPSY  11/21/2020   Procedure: BIOPSY;  Surgeon: Nandigam, Kavitha V, MD;  Location: [facility] ENDOS[facility];  Service: Endoscopy;;   CARDIAC CATHETERIZATION  05/16/2015   CARDIAC CATHETERIZATION N/A 05/16/2015   Procedure: Left Heart Cath and  Coronary Angiography;  Surgeon: David W Harding, MD;  [facility]: [facility];  Service: Cardiovascular;  Laterality: N/A;   CARDIAC CATHETERIZATION N/A 05/16/2015   Procedure: Coronary Balloon Angioplasty;  Surgeon: David W Harding, MD;  Location: [facility];  Service: Cardiovascular;  Laterality: N/A;   CARDIAC CATHETERIZATION N/A 05/17/2015   Procedure: Coronary Stent Intervention Rotoblater;  Surgeon: Peter M Jordan, MD;  [facility]: [facility];  Service: Cardiovascular;  Laterality: N/A;   COLONOSCOPY WITH PROPOFOL N/A 11/21/2020   Procedure: COLONOSCOPY WITH PROPOFOL;  Surgeon: Nandigam, Kavitha V, MD;  Location: [facility];  Service: Endoscopy;  Laterality: N/A;   ELBOW SURGERY Left 2016   "had bone pieces removed"   ESOPHAGEAL MANOMETRY N/A 12/30/2016   Procedure: ESOPHAGEAL MANOMETRY (EM);  Surgeon: Perry, John N, MD;  Location: [facility];  Service: Endoscopy;  Laterality: N/A;   ESOPHAGOGASTRODUODENOSCOPY (EGD) WITH PROPOFOL N/A 11/21/2020   Procedure: ESOPHAGOGASTRODUODENOSCOPY (EGD) WITH PROPOFOL;  Surgeon: Nandigam, Kavitha V, MD;  Location: [facility];  Service: Endoscopy;  Laterality: N/A;   EXCISIONAL HEMORRHOIDECTOMY     FEMORAL BYPASS  ~ 2010   IR RADIOLOGIST EVAL & MGMT  12/17/2016   IVC FILTER PLACEMENT ([facility] HX)  2010   "had blood clots in my back"   NECK SURGERY  2014   2008-2014   PERIPHERAL VASCULAR ATHERECTOMY  01/07/2018   Procedure: PERIPHERAL VASCULAR ATHERECTOMY;  Surgeon: Baraka Klatt A, MD;  [facility];  Service: Cardiovascular;;   PERIPHERAL VASCULAR BALLOON ANGIOPLASTY  01/07/2018   Procedure: PERIPHERAL VASCULAR BALLOON ANGIOPLASTY;  Surgeon: Jeymi Hepp A, MD;  [facility];  Service: Cardiovascular;;   PERIPHERAL VASCULAR CATHETERIZATION N/A 02/14/2016   Procedure: Abdominal Aortogram w/Lower Extremity;  Surgeon: Shaneice Barsanti A Shuntell Foody, MD;  [facility];  Service: Cardiovascular;   Laterality: N/A;   POLYPECTOMY  11/21/2020   Procedure: POLYPECTOMY;  Surgeon: Nandigam, Kavitha V, MD;  Location: [facility];  Service: Endoscopy;;   POSTERIOR LAMINECTOMY / DECOMPRESSION LUMBAR SPINE  2006   "bone spurs"   VIDEO BRONCHOSCOPY WITH ENDOBRONCHIAL NAVIGATION Right 02/25/2018   Procedure: VIDEO BRONCHOSCOPY WITH ENDOBRONCHIAL NAVIGATION;  Surgeon: Icard, Bradley L, DO;  [facility];  Service: Thoracic;  Laterality: Right;   VIDEO BRONCHOSCOPY WITH ENDOBRONCHIAL ULTRASOUND Right 02/25/2018   Procedure: VIDEO BRONCHOSCOPY WITH ENDOBRONCHIAL ULTRASOUND;  Surgeon: Icard, Bradley L, DO;  Location: [facility] OR;  Service: Thoracic;  Laterality: Right;     Current Outpatient Medications  Medication Sig Dispense Refill   acetaminophen (TYLENOL) 500 MG tablet Take 1,000 mg by mouth every 6 (six) hours as needed for mild pain.     albuterol (PROAIR HFA) 108 (90 Base) MCG/ACT inhaler USE 2 PUFFS EVERY 6 HOURS  AS NEEDED FOR WHEEZING OR  SHORTNESS OF BREATH (Patient taking differently: Inhale 2 puffs into the lungs every 6 (six) hours as needed for wheezing or shortness of breath.) 34 g 3   albuterol (PROVENTIL) (2.5   MG/3ML) 0.083% nebulizer solution Take 3 mLs (2.5 mg total) by nebulization every 6 (six) hours as needed for wheezing or shortness of breath. 75 mL 5   aspirin EC 81 MG tablet Take 81 mg by mouth daily.     atorvastatin (LIPITOR) 40 MG tablet Take 1 tablet (40 mg total) by mouth daily. 15 tablet 1   Blood Glucose Monitoring Suppl (ONETOUCH VERIO) w/Device KIT 1 Device by Does not apply route once. 1 kit 0   carvedilol (COREG) 12.5 MG tablet Take 1 tablet (12.5 mg total) by mouth 2 (two) times daily with a meal. 60 tablet 3   colchicine 0.6 MG tablet TAKE 2 TABLETS BY MOUTH AS  DIRECTED THEN 1 TABLET  AFTER 1 HOUR. CAN TAKE 1  TABLET TWICE DAILY  THEREAFTER. (Patient taking differently: Take 0.6 mg by mouth 2 (two) times daily as needed (gout flare).) 60 tablet 11   DULoxetine  (CYMBALTA) 60 MG capsule Take 1 capsule (60 mg total) by mouth daily. For Neuropathy 30 capsule 6   fexofenadine (ALLEGRA ALLERGY) 180 MG tablet Take 1 tablet (180 mg total) by mouth daily. 90 tablet 3   Fluticasone-Umeclidin-Vilant (TRELEGY ELLIPTA) 100-62.5-25 MCG/INH AEPB Inhale 1 puff into the lungs daily. 2 each 5   hydrocortisone (ANUSOL-HC) 25 MG suppository Place 1 suppository (25 mg total) rectally at bedtime. 12 suppository 0   losartan (COZAAR) 25 MG tablet TAKE 1 TABLET BY MOUTH  DAILY (Patient taking differently: Take 25 mg by mouth daily.) 90 tablet 0   methocarbamol (ROBAXIN) 500 MG tablet Take 500 mg by mouth every 6 (six) hours as needed for muscle spasms.     ONE TOUCH LANCETS MISC 1 each by Does not apply route 3 (three) times daily. 200 each 5   OVER THE COUNTER MEDICATION Apply 1 application topically daily as needed (neuropathy pain). Hemp oil cream     pantoprazole (PROTONIX) 40 MG tablet Take 1 tablet (40 mg total) by mouth 2 (two) times daily before a meal. 60 tablet 0   pregabalin (LYRICA) 100 MG capsule Take 1 capsule (100 mg total) by mouth daily. 60 capsule 5   traZODone (DESYREL) 100 MG tablet TAKE 1 TABLET BY MOUTH AT  BEDTIME (Patient taking differently: Take 100 mg by mouth at bedtime as needed for sleep.) 90 tablet 2   clopidogrel (PLAVIX) 75 MG tablet Take 1 tablet (75 mg total) by mouth daily. (Patient not taking: Reported on 12/19/2020) 15 tablet 1   glucose blood (ONETOUCH ULTRA) test strip Use as instructed 300 strip 0   No current facility-administered medications for this visit.    Allergies:   Lisinopril, Amitriptyline, Contrast media [iodinated diagnostic agents], and Ibuprofen    Social History:  The patient  reports that he has been smoking cigarettes. He has a 24.50 pack-year smoking history. He has never used smokeless tobacco. He reports current alcohol use. He reports that he does not use drugs.   Family History:  The patient's family history  includes Aneurysm in his brother; Healthy in his daughter and maternal grandmother; Heart attack in his father; Hypertension in his father and mother; Leukemia in his grandchild; Stomach cancer in his mother; Stroke in his father.    ROS:  Please see the history of present illness.   Otherwise, review of systems are positive for none.   All other systems are reviewed and negative.    PHYSICAL EXAM: VS:  BP 134/76   Pulse (!) 59   Ht 5'   8.5" (1.74 m)   Wt 185 lb (83.9 kg)   SpO2 98%   BMI 27.72 kg/m  , BMI Body mass index is 27.72 kg/m. GEN: Well nourished, well developed, in no acute distress  HEENT: normal  Neck: no JVD, carotid bruits, or masses Cardiac: RRR; no murmurs, rubs, or gallops,no edema  Respiratory: Bilateral rhonchi and mild expiratory wheezing. GI: soft, nontender, nondistended, + BS MS: no deformity or atrophy  Skin: warm and dry, no rash Neuro:  Strength and sensation are intact Psych: euthymic mood, full affect Vascular: Femoral pulses are normal bilaterally.  Distal pulses are not palpable.  Mild cyanosis in the feet.    EKG:  EKG is  ordered today. Sinus bradycardia with a heart rate of 59 bpm.  No significant ST or T wave changes.  Recent Labs: 11/15/2020: TSH 2.119 11/21/2020: ALT 39; Magnesium 1.2 12/19/2020: BUN 9; Creatinine, Ser 0.75; Hemoglobin 13.6; Platelets 250; Potassium 5.0; Sodium 137    Lipid Panel    Component Value Date/Time   CHOL 192 07/09/2019 1435   CHOL 215 (H) 11/21/2017 0810   TRIG 79 07/09/2019 1435   HDL 101 07/09/2019 1435   HDL 126 11/21/2017 0810   CHOLHDL 1.9 07/09/2019 1435   VLDL 37.0 07/09/2018 1040   LDLCALC 75 07/09/2019 1435   LDLDIRECT 107.0 04/21/2015 1352      Wt Readings from Last 3 Encounters:  12/19/20 185 lb (83.9 kg)  12/07/20 183 lb (83 kg)  11/16/20 176 lb (79.8 kg)         ASSESSMENT AND PLAN:  1.   Peripheral arterial disease: Status post aortobifemoral bypass .  Status post endovascular  intervention to the right ostial and proximal SFA with excellent results.  The patient presents now with severe rest pain affecting the right lower extremity with significant claudication on the left side as well.  Fortunately, he does not have any ulceration.  Given severity of his symptoms, I have recommended proceeding with abdominal aortogram with lower extremity runoff and possible endovascular intervention with planned access via the left brachial artery given the presence of aortobifemoral bypass.  We will obtain an ABI and duplex before the procedure.  Continue low-dose aspirin for now.  2. Coronary artery disease: Status post atherectomy and drug-eluting stent placement to the LAD.  The patient has no angina.  Continue low-dose aspirin.  3. Tobacco use: He is trying to cut down on his own.  I discussed with him the importance of smoking cessation.  4. Hyperlipidemia: Continue atorvastatin 40 mg once daily.  Most recent lipid profile showed an LDL of 75.  5.  Excessive alcohol use: He reports that he quit drinking after his most recent hospitalization in August.  6.  Essential hypertension: Blood pressure is well controlled.  7.  Paroxysmal atrial fibrillation: He is currently in sinus rhythm.  I agree that he is not a good candidate for anticoagulation given recent GI bleed and history of excessive alcohol use.    Disposition:   FU with me after angiogram.  Signed,  Savalas Monje, MD  12/20/2020 3:40 PM    [facility] 

## 2020-12-19 NOTE — H&P (View-Only) (Signed)
Cardiology Office Note   Date:  12/20/2020   ID:  AZIAH [name], DOB 1955/05/20, MRN [id number]  PCP:  Joshua Rakes, MD  Cardiologist:  Dr. Marlou [name]  No chief complaint on file.     History of Present Illness: Joshua [name] is a 65 y.o. male who is here today for a follow up visit regarding peripheral arterial disease.  I have not seen him since 2019. He has known history of coronary artery disease status post rotational atherectomy and drug-eluting stent placement to the mid LAD. He has known history of peripheral arterial disease status post aortobifemoral bypass in 2010 at [facility], diabetes mellitus with diabetic neuropathy, hyperlipidemia and tobacco use. The patient does not have left carotid bruit but carotid Doppler showed less than 40% stenosis. He had worsening right leg claudication in 2019 and thus angiography was performed which showed widely patent aortobifemoral bypass.  There was severe calcified stenosis affecting the ostial SFA with moderate calcified disease in the mid and distal segment and three-vessel runoff below the knee.  On the left side, there was 60% ostial stenosis of the left SFA which was stable from before.  I performed orbital atherectomy and drug-coated balloon angioplasty to the ostial and proximal right SFA via the left brachial artery.  He has known history of tobacco use and heavy alcohol use.  He was hospitalized in August with suspected GI bleed and was found to be in atrial fibrillation but converted to sinus rhythm.  He was felt to be not a good anticoagulation candidate.  Echocardiogram showed normal LV systolic function.  He had EGD and colonoscopy and was told about stomach ulcers and colon polyps which were removed.  He presents now for urgent evaluation regarding severe pain affecting his right lower extremity from the hip all the way down.  The pain is at rest with continuous cramping in both legs but worse on the right than the left  side.  He has no lower extremity ulceration.  Both feet feel very cold to touch.  He reports that he quit drinking alcohol since his hospitalization.  He continues to smoke.  Past Medical History:  Diagnosis Date   AC (acromioclavicular) joint bone spurs, unspecified laterality 11/25/2016   Arthritis    "back" (05/16/2015)   CAD in native artery, occluded Lcx and rotational atherectomy to LAD with DES 05/17/15 05/18/2015   Cavitary lesion of lung 02/25/2018   Chronic back pain    "all my back"   Chronic bronchitis (HCC)    COPD (chronic obstructive pulmonary disease) ([place])    Diabetes mellitus without complication (HCC)    GERD (gastroesophageal reflux disease)    H/O blood clots    "had them in my back; put filter in before one of my neck ORs"   History of gout    Hyperlipidemia    Hypertension    Neuromuscular disorder ([place])    PAD (peripheral artery disease) (HCC)    Phlebitis    Positive TB test    Seasonal allergies    Shortness of breath    Spinal disease     Past Surgical History:  Procedure Laterality Date   ABDOMINAL AORTOGRAM W/LOWER EXTREMITY  01/07/2018   ABDOMINAL AORTOGRAM W/LOWER EXTREMITY N/A 01/07/2018   Procedure: ABDOMINAL AORTOGRAM W/LOWER EXTREMITY;  Surgeon: Joshua Hampshire, MD;  Location: [facility];  Service: Cardiovascular;  Laterality: N/A;   ANTERIOR CERVICAL DECOMP/DISCECTOMY FUSION  ~ 2001-2009 X 3   ANTERIOR CERVICAL DECOMP/DISCECTOMY  FUSION N/A 10/06/2012   Procedure: ANTERIOR CERVICAL DECOMPRESSION/DISCECTOMY FUSION 1 LEVEL;  Surgeon: Joshua Ghee, MD;  Location: [facility];  Service: Neurosurgery;  Laterality: N/A;  C7T1 anterior cervical decompression with fusion plating and bonegraft    BACK SURGERY     BIOPSY  11/21/2020   Procedure: BIOPSY;  Surgeon: Joshua Pole, MD;  Location: [facility];  Service: Endoscopy;;   CARDIAC CATHETERIZATION  05/16/2015   CARDIAC CATHETERIZATION N/A 05/16/2015   Procedure: Left Heart Cath and  Coronary Angiography;  Surgeon: Joshua Man, MD;  Location: [facility];  Service: Cardiovascular;  Laterality: N/A;   CARDIAC CATHETERIZATION N/A 05/16/2015   Procedure: Coronary Balloon Angioplasty;  Surgeon: Joshua Man, MD;  Location: [facility];  Service: Cardiovascular;  Laterality: N/A;   CARDIAC CATHETERIZATION N/A 05/17/2015   Procedure: Coronary Stent Intervention Rotoblater;  Surgeon: Joshua M Martinique, MD;  Location: [facility];  Service: Cardiovascular;  Laterality: N/A;   COLONOSCOPY WITH PROPOFOL N/A 11/21/2020   Procedure: COLONOSCOPY WITH PROPOFOL;  Surgeon: Joshua Pole, MD;  [facility];  Service: Endoscopy;  Laterality: N/A;   ELBOW SURGERY Left 2016   "had bone pieces removed"   ESOPHAGEAL MANOMETRY N/A 12/30/2016   Procedure: ESOPHAGEAL MANOMETRY (EM);  Surgeon: Joshua Shipper, MD;  [facility];  Service: Endoscopy;  Laterality: N/A;   ESOPHAGOGASTRODUODENOSCOPY (EGD) WITH PROPOFOL N/A 11/21/2020   Procedure: ESOPHAGOGASTRODUODENOSCOPY (EGD) WITH PROPOFOL;  Surgeon: Joshua Pole, MD;  [facility];  Service: Endoscopy;  Laterality: N/A;   EXCISIONAL HEMORRHOIDECTOMY     FEMORAL BYPASS  ~ 2010   IR RADIOLOGIST EVAL & MGMT  12/17/2016   IVC FILTER PLACEMENT ([place] [facility])  2010   "had blood clots in my back"   NECK SURGERY  2014   2008-2014   PERIPHERAL VASCULAR ATHERECTOMY  01/07/2018   Procedure: PERIPHERAL VASCULAR ATHERECTOMY;  Surgeon: Joshua Hampshire, MD;  Location: [facility];  Service: Cardiovascular;;   PERIPHERAL VASCULAR BALLOON ANGIOPLASTY  01/07/2018   Procedure: PERIPHERAL VASCULAR BALLOON ANGIOPLASTY;  Surgeon: Joshua Hampshire, MD;  Location: [facility];  Service: Cardiovascular;;   PERIPHERAL VASCULAR CATHETERIZATION N/A 02/14/2016   Procedure: Abdominal Aortogram w/Lower Extremity;  Surgeon: Joshua Hampshire, MD;  Location: [facility];  Service: Cardiovascular;   Laterality: N/A;   POLYPECTOMY  11/21/2020   Procedure: POLYPECTOMY;  Surgeon: Joshua Pole, MD;  [facility];  Service: Endoscopy;;   POSTERIOR LAMINECTOMY / DECOMPRESSION LUMBAR SPINE  2006   "bone spurs"   VIDEO BRONCHOSCOPY WITH ENDOBRONCHIAL NAVIGATION Right 02/25/2018   Procedure: VIDEO BRONCHOSCOPY WITH ENDOBRONCHIAL NAVIGATION;  Surgeon: Garner Nash, DO;  Location: [facility];  Service: Thoracic;  Laterality: Right;   VIDEO BRONCHOSCOPY WITH ENDOBRONCHIAL ULTRASOUND Right 02/25/2018   Procedure: VIDEO BRONCHOSCOPY WITH ENDOBRONCHIAL ULTRASOUND;  Surgeon: Garner Nash, DO;  Location: [facility];  Service: Thoracic;  Laterality: Right;     Current Outpatient Medications  Medication Sig Dispense Refill   acetaminophen (TYLENOL) 500 MG tablet Take 1,000 mg by mouth every 6 (six) hours as needed for mild pain.     albuterol (PROAIR HFA) 108 (90 Base) MCG/ACT inhaler USE 2 PUFFS EVERY 6 HOURS  AS NEEDED FOR WHEEZING OR  SHORTNESS OF BREATH (Patient taking differently: Inhale 2 puffs into the lungs every 6 (six) hours as needed for wheezing or shortness of breath.) 34 g 3   albuterol (PROVENTIL) (2.5  MG/3ML) 0.083% nebulizer solution Take 3 mLs (2.5 mg total) by nebulization every 6 (six) hours as needed for wheezing or shortness of breath. 75 mL 5   aspirin EC 81 MG tablet Take 81 mg by mouth daily.     atorvastatin (LIPITOR) 40 MG tablet Take 1 tablet (40 mg total) by mouth daily. 15 tablet 1   Blood Glucose Monitoring Suppl (ONETOUCH VERIO) w/Device KIT 1 Device by Does not apply route once. 1 kit 0   carvedilol (COREG) 12.5 MG tablet Take 1 tablet (12.5 mg total) by mouth 2 (two) times daily with a meal. 60 tablet 3   colchicine 0.6 MG tablet TAKE 2 TABLETS BY MOUTH AS  DIRECTED THEN 1 TABLET  AFTER 1 HOUR. CAN TAKE 1  TABLET TWICE DAILY  THEREAFTER. (Patient taking differently: Take 0.6 mg by mouth 2 (two) times daily as needed (gout flare).) 60 tablet 11   DULoxetine  (CYMBALTA) 60 MG capsule Take 1 capsule (60 mg total) by mouth daily. For Neuropathy 30 capsule 6   fexofenadine (ALLEGRA ALLERGY) 180 MG tablet Take 1 tablet (180 mg total) by mouth daily. 90 tablet 3   Fluticasone-Umeclidin-Vilant (TRELEGY ELLIPTA) 100-62.5-25 MCG/INH AEPB Inhale 1 puff into the lungs daily. 2 each 5   hydrocortisone (ANUSOL-HC) 25 MG suppository Place 1 suppository (25 mg total) rectally at bedtime. 12 suppository 0   losartan (COZAAR) 25 MG tablet TAKE 1 TABLET BY MOUTH  DAILY (Patient taking differently: Take 25 mg by mouth daily.) 90 tablet 0   methocarbamol (ROBAXIN) 500 MG tablet Take 500 mg by mouth every 6 (six) hours as needed for muscle spasms.     ONE TOUCH LANCETS MISC 1 each by Does not apply route 3 (three) times daily. 200 each 5   OVER THE COUNTER MEDICATION Apply 1 application topically daily as needed (neuropathy pain). Hemp oil cream     pantoprazole (PROTONIX) 40 MG tablet Take 1 tablet (40 mg total) by mouth 2 (two) times daily before a meal. 60 tablet 0   pregabalin (LYRICA) 100 MG capsule Take 1 capsule (100 mg total) by mouth daily. 60 capsule 5   traZODone (DESYREL) 100 MG tablet TAKE 1 TABLET BY MOUTH AT  BEDTIME (Patient taking differently: Take 100 mg by mouth at bedtime as needed for sleep.) 90 tablet 2   clopidogrel (PLAVIX) 75 MG tablet Take 1 tablet (75 mg total) by mouth daily. (Patient not taking: Reported on 12/19/2020) 15 tablet 1   glucose blood (ONETOUCH ULTRA) test strip Use as instructed 300 strip 0   No current facility-administered medications for this visit.    Allergies:   Lisinopril, Amitriptyline, Contrast media [iodinated diagnostic agents], and Ibuprofen    Social History:  The patient  reports that he has been smoking cigarettes. He has a 24.50 pack-year smoking history. He has never used smokeless tobacco. He reports current alcohol use. He reports that he does not use drugs.   Family History:  The patient's family history  includes Aneurysm in his brother; Healthy in his daughter and maternal grandmother; Heart attack in his father; Hypertension in his father and mother; Leukemia in his grandchild; Stomach cancer in his mother; Stroke in his father.    ROS:  Please see the history of present illness.   Otherwise, review of systems are positive for none.   All other systems are reviewed and negative.    PHYSICAL EXAM: VS:  BP 134/76   Pulse (!) 59   Ht 5'  8.5" (1.74 m)   Wt 185 lb (83.9 kg)   SpO2 98%   BMI 27.72 kg/m  , BMI Body mass index is 27.72 kg/m. GEN: Well nourished, well developed, in no acute distress  HEENT: normal  Neck: no JVD, carotid bruits, or masses Cardiac: RRR; no murmurs, rubs, or gallops,no edema  Respiratory: Bilateral rhonchi and mild expiratory wheezing. GI: soft, nontender, nondistended, + BS MS: no deformity or atrophy  Skin: warm and dry, no rash Neuro:  Strength and sensation are intact Psych: euthymic mood, full affect Vascular: Femoral pulses are normal bilaterally.  Distal pulses are not palpable.  Mild cyanosis in the feet.    EKG:  EKG is  ordered today. Sinus bradycardia with a heart rate of 59 bpm.  No significant ST or T wave changes.  Recent Labs: 11/15/2020: TSH 2.119 11/21/2020: ALT 39; Magnesium 1.2 12/19/2020: BUN 9; Creatinine, Ser 0.75; Hemoglobin 13.6; Platelets 250; Potassium 5.0; Sodium 137    Lipid Panel    Component Value Date/Time   CHOL 192 07/09/2019 1435   CHOL 215 (H) 11/21/2017 0810   TRIG 79 07/09/2019 1435   HDL 101 07/09/2019 1435   HDL 126 11/21/2017 0810   CHOLHDL 1.9 07/09/2019 1435   VLDL 37.0 07/09/2018 1040   LDLCALC 75 07/09/2019 1435   LDLDIRECT 107.0 04/21/2015 1352      Wt Readings from Last 3 Encounters:  12/19/20 185 lb (83.9 kg)  12/07/20 183 lb (83 kg)  11/16/20 176 lb (79.8 kg)         ASSESSMENT AND PLAN:  1.   Peripheral arterial disease: Status post aortobifemoral bypass .  Status post endovascular  intervention to the right ostial and proximal SFA with excellent results.  The patient presents now with severe rest pain affecting the right lower extremity with significant claudication on the left side as well.  Fortunately, he does not have any ulceration.  Given severity of his symptoms, I have recommended proceeding with abdominal aortogram with lower extremity runoff and possible endovascular intervention with planned access via the left brachial artery given the presence of aortobifemoral bypass.  We will obtain an ABI and duplex before the procedure.  Continue low-dose aspirin for now.  2. Coronary artery disease: Status post atherectomy and drug-eluting stent placement to the LAD.  The patient has no angina.  Continue low-dose aspirin.  3. Tobacco use: He is trying to cut down on his own.  I discussed with him the importance of smoking cessation.  4. Hyperlipidemia: Continue atorvastatin 40 mg once daily.  Most recent lipid profile showed an LDL of 75.  5.  Excessive alcohol use: He reports that he quit drinking after his most recent hospitalization in August.  6.  Essential hypertension: Blood pressure is well controlled.  7.  Paroxysmal atrial fibrillation: He is currently in sinus rhythm.  I agree that he is not a good candidate for anticoagulation given recent GI bleed and history of excessive alcohol use.    Disposition:   FU with me after angiogram.  Signed,  Kathlyn Sacramento, MD  12/20/2020 3:40 PM    [place]

## 2020-12-22 ENCOUNTER — Telehealth: Payer: Self-pay | Admitting: Cardiovascular Disease

## 2020-12-22 ENCOUNTER — Ambulatory Visit (HOSPITAL_COMMUNITY)
Admission: RE | Admit: 2020-12-22 | Discharge: 2020-12-22 | Disposition: A | Discharge status: Home / Self Care | Payer: Medicare Other | Source: Ambulatory Visit | Attending: Cardiovascular Disease | Admitting: Cardiovascular Disease

## 2020-12-22 ENCOUNTER — Other Ambulatory Visit: Payer: Self-pay

## 2020-12-22 ENCOUNTER — Ambulatory Visit (HOSPITAL_BASED_OUTPATIENT_CLINIC_OR_DEPARTMENT_OTHER)
Admission: RE | Admit: 2020-12-22 | Discharge: 2020-12-22 | Disposition: A | Discharge status: Home / Self Care | Payer: Medicare Other | Source: Ambulatory Visit | Attending: Cardiovascular Disease | Admitting: Cardiovascular Disease

## 2020-12-22 DIAGNOSIS — M79604 Pain in right leg: Secondary | ICD-10-CM | POA: Diagnosis not present

## 2020-12-22 DIAGNOSIS — Z95828 Presence of other vascular implants and grafts: Secondary | ICD-10-CM

## 2020-12-22 DIAGNOSIS — I739 Peripheral vascular disease, unspecified: Secondary | ICD-10-CM | POA: Insufficient documentation

## 2020-12-22 DIAGNOSIS — M79605 Pain in left leg: Secondary | ICD-10-CM | POA: Diagnosis not present

## 2020-12-22 NOTE — Telephone Encounter (Signed)
Pt is returning call from earlier today! 

## 2020-12-22 NOTE — Telephone Encounter (Signed)
Patient made aware of results and verbalized understanding.  Joshua Hampshire, MD  Ricci Barker, RN Severely reduced ABI on the right side with a new occlusion of the right mid SFA which is the likely culprit.  The patient is already scheduled for angiography and will try to revascularize the SFA.

## 2020-12-26 ENCOUNTER — Ambulatory Visit: Payer: Self-pay | Admitting: *Deleted

## 2020-12-26 ENCOUNTER — Telehealth: Payer: Self-pay | Admitting: *Deleted

## 2020-12-26 MED ORDER — PREDNISONE 50 MG PO TABS: ORAL_TABLET | ORAL | 0 refills | Status: DC

## 2020-12-26 NOTE — Telephone Encounter (Addendum)
Abdominal aortogram scheduled at [facility] for: Wednesday December 27, 2020 8:30 AM [facility] Main Entrance [facility]) at: 6:30 AM   No solid food after midnight prior to cath, clear liquids until 5 AM day of procedure.  CONTRAST ALLERGY: yes-13 hour Prednisone and Benadryl Prep: 12/26/20 Prednisone 50 mg 7:30 PM 12/27/20 Prednisone 50 mg 1:30 AM 12/27/20 Prednisone 50 mg and Benadryl 50 mg -just prior to leaving home for hospital Do not drive.  Usual morning medications can be taken pre-cath with sips of water including: - aspirin 81 mg -Prednisone 50 mg -Benadryl 50 mg    Confirmed patient has responsible adult to drive home post procedure and be with patient first 24 hours after arriving home.  [facility] does allow one visitor to accompany you and wait in the hospital waiting room while you are there for your procedure. You and your visitor will be asked to wear a mask once you enter the hospital.   Patient reports does not currently have any symptoms concerning for COVID-19 and no household members with COVID-19 like illness.                  Reviewed procedure/mask/visitor instructions with patient.

## 2020-12-26 NOTE — Telephone Encounter (Signed)
Reason for Disposition  [1] Caller has URGENT medicine question about med that PCP or specialist prescribed AND [2] triager unable to answer question  Answer Assessment - Initial Assessment Questions 1. NAME of MEDICATION: "What medicine are you calling about?"     Coreg, metoprolol tartrate, omeprazole, Protonix, losartan . 2. QUESTION: "What is your question?" (e.g., double dose of medicine, side effect)     Clarify which medications to resume and which to stop. 3. PRESCRIBING HCP: "Who prescribed it?" Reason: if prescribed by specialist, call should be referred to that group.     PCP and hospital MD  4. SYMPTOMS: "Do you have any symptoms?"     Fatigue  B/P 108/64 HR 58 5. SEVERITY: If symptoms are present, ask "Are they mild, moderate or severe?"     Fatigue  6. PREGNANCY:  "Is there any chance that you are pregnant?" "When was your last menstrual period?"     na  Protocols used: Medication Question Call-A-AH

## 2020-12-26 NOTE — Telephone Encounter (Signed)
His Med list has an updated list of medications which was reflected on his AVS after his last visit. Not sure where Metoprolol and Omeprazole came from. Please ask her to check his last AVS He was also to follow up with me in 3 weeks from last visit which happens to be about now.

## 2020-12-26 NOTE — Telephone Encounter (Signed)
Routing to PCP for review.

## 2020-12-26 NOTE — Telephone Encounter (Signed)
Nurse practioner,l Tillie Rung B called in about patient that was lrady on beta blocker in hospital and they are wanting to know should he continue takint it . Please call back   Called patient to review which medications are in question . Tillie Rung, NP still with patient and would like PCP to clarify which medications for patient to continue and which to discontinue or stop after hospital discharge. Tillie Rung, NP is doing a wellness visit with patient and noted B/P 108/64 p 58 only symptom fatigue. Please clarify if patient is to take coreg 12.5 mg BID or metoprolol tartrate 25 mg BID. Is patient to take omeprazole and / or protonix. Clarify if patient is to take losartan or stop. Patient requesting a call back today . Care advise given. Patient verbalized understanding of care advise and to call back or go to ED or call 911 if symptoms worsen.

## 2020-12-27 ENCOUNTER — Encounter (HOSPITAL_COMMUNITY)
Admission: AD | Disposition: A | Discharge status: Home / Self Care | Payer: Self-pay | Source: Home / Self Care | Attending: Cardiovascular Disease

## 2020-12-27 ENCOUNTER — Encounter (HOSPITAL_COMMUNITY): Payer: Self-pay | Admitting: Cardiovascular Disease

## 2020-12-27 ENCOUNTER — Other Ambulatory Visit: Payer: Self-pay

## 2020-12-27 ENCOUNTER — Inpatient Hospital Stay (HOSPITAL_COMMUNITY)
Admission: AD | Admit: 2020-12-27 | Discharge: 2020-12-29 | DRG: 254 | Disposition: A | Discharge status: Home / Self Care | Payer: Medicare Other | Attending: Cardiovascular Disease | Admitting: Cardiovascular Disease

## 2020-12-27 DIAGNOSIS — F17210 Nicotine dependence, cigarettes, uncomplicated: Secondary | ICD-10-CM | POA: Diagnosis present

## 2020-12-27 DIAGNOSIS — Z8719 Personal history of other diseases of the digestive system: Secondary | ICD-10-CM | POA: Diagnosis not present

## 2020-12-27 DIAGNOSIS — Z7951 Long term (current) use of inhaled steroids: Secondary | ICD-10-CM | POA: Diagnosis not present

## 2020-12-27 DIAGNOSIS — I480 Paroxysmal atrial fibrillation: Secondary | ICD-10-CM | POA: Diagnosis present

## 2020-12-27 DIAGNOSIS — I70223 Atherosclerosis of native arteries of extremities with rest pain, bilateral legs: Principal | ICD-10-CM | POA: Diagnosis present

## 2020-12-27 DIAGNOSIS — F1090 Alcohol use, unspecified, uncomplicated: Secondary | ICD-10-CM | POA: Diagnosis present

## 2020-12-27 DIAGNOSIS — Z79899 Other long term (current) drug therapy: Secondary | ICD-10-CM

## 2020-12-27 DIAGNOSIS — Z20822 Contact with and (suspected) exposure to covid-19: Secondary | ICD-10-CM | POA: Diagnosis not present

## 2020-12-27 DIAGNOSIS — M549 Dorsalgia, unspecified: Secondary | ICD-10-CM | POA: Diagnosis not present

## 2020-12-27 DIAGNOSIS — Z95820 Peripheral vascular angioplasty status with implants and grafts: Secondary | ICD-10-CM

## 2020-12-27 DIAGNOSIS — I70229 Atherosclerosis of native arteries of extremities with rest pain, unspecified extremity: Secondary | ICD-10-CM

## 2020-12-27 DIAGNOSIS — J302 Other seasonal allergic rhinitis: Secondary | ICD-10-CM | POA: Diagnosis present

## 2020-12-27 DIAGNOSIS — Z888 Allergy status to other drugs, medicaments and biological substances status: Secondary | ICD-10-CM

## 2020-12-27 DIAGNOSIS — Z955 Presence of coronary angioplasty implant and graft: Secondary | ICD-10-CM

## 2020-12-27 DIAGNOSIS — E1151 Type 2 diabetes mellitus with diabetic peripheral angiopathy without gangrene: Secondary | ICD-10-CM | POA: Diagnosis present

## 2020-12-27 DIAGNOSIS — J449 Chronic obstructive pulmonary disease, unspecified: Secondary | ICD-10-CM | POA: Diagnosis present

## 2020-12-27 DIAGNOSIS — E7800 Pure hypercholesterolemia, unspecified: Secondary | ICD-10-CM

## 2020-12-27 DIAGNOSIS — Z8672 Personal history of thrombophlebitis: Secondary | ICD-10-CM | POA: Diagnosis not present

## 2020-12-27 DIAGNOSIS — I739 Peripheral vascular disease, unspecified: Secondary | ICD-10-CM

## 2020-12-27 DIAGNOSIS — Z23 Encounter for immunization: Secondary | ICD-10-CM

## 2020-12-27 DIAGNOSIS — E785 Hyperlipidemia, unspecified: Secondary | ICD-10-CM | POA: Diagnosis present

## 2020-12-27 DIAGNOSIS — M479 Spondylosis, unspecified: Secondary | ICD-10-CM | POA: Diagnosis not present

## 2020-12-27 DIAGNOSIS — E1142 Type 2 diabetes mellitus with diabetic polyneuropathy: Secondary | ICD-10-CM | POA: Diagnosis not present

## 2020-12-27 DIAGNOSIS — Z886 Allergy status to analgesic agent status: Secondary | ICD-10-CM

## 2020-12-27 DIAGNOSIS — I10 Essential (primary) hypertension: Secondary | ICD-10-CM | POA: Diagnosis not present

## 2020-12-27 DIAGNOSIS — E1140 Type 2 diabetes mellitus with diabetic neuropathy, unspecified: Secondary | ICD-10-CM

## 2020-12-27 DIAGNOSIS — Z7982 Long term (current) use of aspirin: Secondary | ICD-10-CM

## 2020-12-27 DIAGNOSIS — I743 Embolism and thrombosis of arteries of the lower extremities: Secondary | ICD-10-CM | POA: Diagnosis not present

## 2020-12-27 DIAGNOSIS — Z8249 Family history of ischemic heart disease and other diseases of the circulatory system: Secondary | ICD-10-CM | POA: Diagnosis not present

## 2020-12-27 DIAGNOSIS — Z91041 Radiographic dye allergy status: Secondary | ICD-10-CM

## 2020-12-27 DIAGNOSIS — G8929 Other chronic pain: Secondary | ICD-10-CM | POA: Diagnosis present

## 2020-12-27 DIAGNOSIS — I2510 Atherosclerotic heart disease of native coronary artery without angina pectoris: Secondary | ICD-10-CM

## 2020-12-27 DIAGNOSIS — I70221 Atherosclerosis of native arteries of extremities with rest pain, right leg: Secondary | ICD-10-CM

## 2020-12-27 DIAGNOSIS — G4700 Insomnia, unspecified: Secondary | ICD-10-CM | POA: Diagnosis not present

## 2020-12-27 DIAGNOSIS — T82868A Thrombosis of vascular prosthetic devices, implants and grafts, initial encounter: Secondary | ICD-10-CM

## 2020-12-27 DIAGNOSIS — K219 Gastro-esophageal reflux disease without esophagitis: Secondary | ICD-10-CM | POA: Diagnosis not present

## 2020-12-27 DIAGNOSIS — Z794 Long term (current) use of insulin: Secondary | ICD-10-CM

## 2020-12-27 HISTORY — PX: ABDOMINAL AORTOGRAM W/LOWER EXTREMITY: CATH118223

## 2020-12-27 LAB — RESP PANEL BY RT-PCR (FLU A&B, COVID) ARPGX2
Influenza A by PCR: NEGATIVE
Influenza B by PCR: NEGATIVE
SARS Coronavirus 2 by RT PCR: NEGATIVE

## 2020-12-27 LAB — SURGICAL PCR SCREEN
MRSA, PCR: NEGATIVE
Staphylococcus aureus: POSITIVE — AB

## 2020-12-27 LAB — GLUCOSE, CAPILLARY: Glucose-Capillary: 149 mg/dL — ABNORMAL HIGH (ref 70–99)

## 2020-12-27 LAB — POCT ACTIVATED CLOTTING TIME: Activated Clotting Time: 121 seconds

## 2020-12-27 SURGERY — ABDOMINAL AORTOGRAM W/LOWER EXTREMITY
Anesthesia: LOCAL

## 2020-12-27 MED ORDER — OXYCODONE-ACETAMINOPHEN 7.5-325 MG PO TABS
1.0000 | ORAL_TABLET | Freq: Four times a day (QID) | ORAL | Status: DC | PRN
  Administered 2020-12-27 – 2020-12-28 (×3): 1 via ORAL
  Filled 2020-12-27 (×3): qty 1

## 2020-12-27 MED ORDER — SODIUM CHLORIDE 0.9% FLUSH: 3.0000 mL | INTRAVENOUS | Status: DC | PRN

## 2020-12-27 MED ORDER — MIDAZOLAM HCL 2 MG/2ML IJ SOLN
INTRAMUSCULAR | Status: DC | PRN
  Administered 2020-12-27: 1 mg via INTRAVENOUS

## 2020-12-27 MED ORDER — SODIUM CHLORIDE 0.9% FLUSH: 3.0000 mL | Freq: Two times a day (BID) | INTRAVENOUS | Status: DC

## 2020-12-27 MED ORDER — MUPIROCIN 2 % EX OINT
1.0000 "application " | TOPICAL_OINTMENT | Freq: Two times a day (BID) | CUTANEOUS | Status: DC
  Administered 2020-12-27 – 2020-12-29 (×3): 1 via NASAL
  Filled 2020-12-27 (×2): qty 22

## 2020-12-27 MED ORDER — SODIUM CHLORIDE 0.9 % WEIGHT BASED INFUSION: 1.0000 mL/kg/h | INTRAVENOUS | Status: DC

## 2020-12-27 MED ORDER — ASPIRIN 81 MG PO CHEW: 81.0000 mg | CHEWABLE_TABLET | ORAL | Status: DC

## 2020-12-27 MED ORDER — LIDOCAINE HCL (PF) 1 % IJ SOLN
INTRAMUSCULAR | Status: AC
  Filled 2020-12-27: qty 30

## 2020-12-27 MED ORDER — HEPARIN (PORCINE) IN NACL 1000-0.9 UT/500ML-% IV SOLN
INTRAVENOUS | Status: AC
  Filled 2020-12-27: qty 1000

## 2020-12-27 MED ORDER — HEPARIN SODIUM (PORCINE) 1000 UNIT/ML IJ SOLN
INTRAMUSCULAR | Status: AC
  Filled 2020-12-27: qty 1

## 2020-12-27 MED ORDER — SODIUM CHLORIDE 0.9 % IV SOLN: 250.0000 mL | INTRAVENOUS | Status: DC | PRN

## 2020-12-27 MED ORDER — INFLUENZA VAC SPLIT QUAD 0.5 ML IM SUSY
0.5000 mL | PREFILLED_SYRINGE | INTRAMUSCULAR | Status: AC
  Administered 2020-12-29: 0.5 mL via INTRAMUSCULAR
  Filled 2020-12-27 (×2): qty 0.5

## 2020-12-27 MED ORDER — FENTANYL CITRATE (PF) 100 MCG/2ML IJ SOLN
INTRAMUSCULAR | Status: AC
  Filled 2020-12-27: qty 2

## 2020-12-27 MED ORDER — ACETAMINOPHEN 325 MG PO TABS: 650.0000 mg | ORAL_TABLET | ORAL | Status: DC | PRN

## 2020-12-27 MED ORDER — SODIUM CHLORIDE 0.9 % WEIGHT BASED INFUSION
3.0000 mL/kg/h | INTRAVENOUS | Status: AC
  Administered 2020-12-27: 3 mL/kg/h via INTRAVENOUS

## 2020-12-27 MED ORDER — MIDAZOLAM HCL 2 MG/2ML IJ SOLN
INTRAMUSCULAR | Status: AC
  Filled 2020-12-27: qty 2

## 2020-12-27 MED ORDER — FENTANYL CITRATE (PF) 100 MCG/2ML IJ SOLN
50.0000 ug | Freq: Once | INTRAMUSCULAR | Status: AC
  Administered 2020-12-27: 50 ug via INTRAVENOUS

## 2020-12-27 MED ORDER — ONDANSETRON HCL 4 MG/2ML IJ SOLN
4.0000 mg | Freq: Four times a day (QID) | INTRAMUSCULAR | Status: DC | PRN
Start: 2020-12-27 — End: 2020-12-28

## 2020-12-27 MED ORDER — LIDOCAINE HCL (PF) 1 % IJ SOLN
INTRAMUSCULAR | Status: DC | PRN
  Administered 2020-12-27: 5 mL via SUBCUTANEOUS

## 2020-12-27 MED ORDER — IODIXANOL 320 MG/ML IV SOLN
INTRAVENOUS | Status: DC | PRN
  Administered 2020-12-27: 130 mL via INTRA_ARTERIAL

## 2020-12-27 MED ORDER — CEFAZOLIN SODIUM-DEXTROSE 2-4 GM/100ML-% IV SOLN: 2.0000 g | INTRAVENOUS | Status: DC

## 2020-12-27 MED ORDER — HEPARIN SODIUM (PORCINE) 1000 UNIT/ML IJ SOLN
INTRAMUSCULAR | Status: DC | PRN
  Administered 2020-12-27: 2000 [IU] via INTRAVENOUS

## 2020-12-27 MED ORDER — SODIUM CHLORIDE 0.9 % IV SOLN: INTRAVENOUS | Status: AC

## 2020-12-27 MED ORDER — FENTANYL CITRATE (PF) 100 MCG/2ML IJ SOLN
INTRAMUSCULAR | Status: DC | PRN
  Administered 2020-12-27: 50 ug via INTRAVENOUS

## 2020-12-27 MED ORDER — HEPARIN (PORCINE) IN NACL 1000-0.9 UT/500ML-% IV SOLN
INTRAVENOUS | Status: DC | PRN
  Administered 2020-12-27 (×2): 500 mL

## 2020-12-27 SURGICAL SUPPLY — 14 items
CATH ANGIO 5F BER2 100CM (CATHETERS) ×2 IMPLANT
CATH ANGIO 5F PIGTAIL 100CM (CATHETERS) ×2 IMPLANT
CATH TEMPO AQUA 5F 100CM (CATHETERS) ×2 IMPLANT
KIT PV (KITS) ×2 IMPLANT
SHEATH PINNACLE 5F 10CM (SHEATH) ×2 IMPLANT
SHEATH PROBE COVER 6X72 (BAG) ×2 IMPLANT
SYR MEDRAD MARK 7 150ML (SYRINGE) ×2 IMPLANT
TRANSDUCER W/STOPCOCK (MISCELLANEOUS) ×2 IMPLANT
TRAY PV CATH (CUSTOM PROCEDURE TRAY) ×2 IMPLANT
WIRE HI TORQ VERSACORE 300 (WIRE) ×2 IMPLANT
WIRE HITORQ VERSACORE ST 145CM (WIRE) ×2 IMPLANT
WIRE MICRO SET SILHO 5FR 7 (SHEATH) ×2 IMPLANT
WIRE ROSEN-J .035X260CM (WIRE) ×2 IMPLANT
WIRE VERSACORE LOC 115CM (WIRE) IMPLANT

## 2020-12-27 NOTE — Interval H&P Note (Signed)
History and Physical Interval Note:  12/27/2020 8:47 AM  Joshua [name]  has presented today for surgery, with the diagnosis of PAD.  The various methods of treatment have been discussed with the patient and family. After consideration of risks, benefits and other options for treatment, the patient has consented to  Procedure(s): ABDOMINAL AORTOGRAM W/LOWER EXTREMITY (N/A) as a surgical intervention.  The patient's history has been reviewed, patient examined, no change in status, stable for surgery.  I have reviewed the patient's chart and labs.  Questions were answered to the patient's satisfaction.     Kathlyn Sacramento

## 2020-12-27 NOTE — Anesthesia Preprocedure Evaluation (Addendum)
Anesthesia Evaluation  Patient identified by MRN, date of birth, ID band Patient awake    Reviewed: Allergy & Precautions, NPO status , Patient's Chart, lab work & pertinent test results  Airway Mallampati: II  TM Distance: >3 FB Neck ROM: Full    Dental  (+) Edentulous Upper, Edentulous Lower   Pulmonary COPD,  COPD inhaler, Current Smoker and Patient abstained from smoking.,    Pulmonary exam normal        Cardiovascular hypertension, Pt. on medications and Pt. on home beta blockers + CAD, + Cardiac Stents and + Peripheral Vascular Disease   Rhythm:Regular Rate:Normal     Neuro/Psych negative neurological ROS  negative psych ROS   GI/Hepatic Neg liver ROS, PUD, GERD  Medicated,  Endo/Other  diabetes, Type 2  Renal/GU negative Renal ROS  negative genitourinary   Musculoskeletal  (+) Arthritis , Osteoarthritis,    Abdominal (+)  Abdomen: soft.    Peds  Hematology negative hematology ROS (+)   Anesthesia Other Findings   Reproductive/Obstetrics                            Anesthesia Physical Anesthesia Plan  ASA: 3  Anesthesia Plan: General   Post-op Pain Management:    Induction: Intravenous  PONV Risk Score and Plan: 1 and Ondansetron, Dexamethasone, Treatment may vary due to age or medical condition and Midazolam  Airway Management Planned: Mask and Oral ETT  Additional Equipment: Arterial line  Intra-op Plan:   Post-operative Plan: Extubation in OR  Informed Consent: I have reviewed the patients History and Physical, chart, labs and discussed the procedure including the risks, benefits and alternatives for the proposed anesthesia with the patient or authorized representative who has indicated his/her understanding and acceptance.     Dental advisory given  Plan Discussed with: CRNA  Anesthesia Plan Comments: (Lab Results      Component                Value                Date                      WBC                      12.4 (H)            12/19/2020                HGB                      13.6                12/19/2020                HCT                      38.9                12/19/2020                MCV                      99 (H)              12/19/2020                PLT  250                 12/19/2020           Lab Results      Component                Value               Date                      NA                       137                 12/19/2020                K                        5.0                 12/19/2020                CO2                      22                  12/19/2020                GLUCOSE                  107 (H)             12/19/2020                BUN                      9                   12/19/2020                CREATININE               0.75 (L)            12/19/2020                CALCIUM                  9.8                 12/19/2020                EGFR                     101                 12/19/2020                GFRNONAA                 >60                 11/21/2020           ECHO 08/22: 1. Left ventricular ejection fraction, by estimation, is 60 to 65%. The  left ventricle has normal function. The left ventricle has no regional  wall motion abnormalities. Left ventricular diastolic parameters were  normal.  2. Right ventricular systolic function is  normal. The right ventricular  size is normal. Tricuspid regurgitation signal is inadequate for assessing  PA pressure.  3. The mitral valve is grossly normal. Trivial mitral valve  regurgitation. No evidence of mitral stenosis.  4. The aortic valve was not well visualized. Aortic valve regurgitation  is not visualized. No aortic stenosis is present.  5. The inferior vena cava is normal in size with greater than 50%  respiratory variability, suggesting right atrial pressure of 3 mmHg. )      Anesthesia Quick  Evaluation

## 2020-12-27 NOTE — Consult Note (Signed)
[facility] Specialist of [place]  Patient name: Joshua [name] MRN: [id number] DOB: 07-25-1955 Sex: male   REQUESTING PROVIDER:   Dr. Fletcher [name]   REASON FOR CONSULT:    Right leg rest pain  HISTORY OF PRESENT ILLNESS:   BURL TAUZIN is a 65 y.o. male, who is status post aortobifemoral bypass graft at [place] many years ago.  He was evaluated in 2019 for worsening right leg claudication.  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  He now is having rest pain in the right leg and claudication in the left.  He underwent angiography earlier today.  This shows that his aortobifemoral graft is widely patent.  He has a high-grade profunda stenosis and a stenosis within the common femoral artery.  The superficial femoral artery is occluded with reconstitution above the knee with two-vessel runoff.  He does not have endovascular options and surgical intervention has been recommended.  He does not have any open wounds.  Patient has a history of coronary artery disease status post PCI.  He is a diabetic with diabetic neuropathy.  He is a current smoker.  He is on a statin for hypercholesterolemia.  PAST MEDICAL HISTORY    Past Medical History:  Diagnosis Date   AC (acromioclavicular) joint bone spurs, unspecified laterality 11/25/2016   Arthritis    "back" (05/16/2015)   CAD in native artery, occluded Lcx and rotational atherectomy to LAD with DES 05/17/15 05/18/2015   Cavitary lesion of lung 02/25/2018   Chronic back pain    "all my back"   Chronic bronchitis (HCC)    COPD (chronic obstructive pulmonary disease) ([place])    Diabetes mellitus without complication (HCC)    GERD (gastroesophageal reflux disease)    H/O blood clots    "had them in my back; put filter in before one of my neck ORs"   History of gout    Hyperlipidemia    Hypertension    Neuromuscular disorder (HCC)     PAD (peripheral artery disease) (HCC)    Phlebitis    Positive TB test    Seasonal allergies    Shortness of breath    Spinal disease      FAMILY HISTORY   Family History  Problem Relation Age of Onset   Stomach cancer Mother        Deceased, 97s   Hypertension Mother    Hypertension Father    Stroke Father    Heart attack Father        Deceased, 83   Aneurysm Brother    Healthy Daughter    Healthy Maternal Grandmother    Leukemia Grandchild    Diabetes Neg Hx    Early death Neg Hx    Colon cancer Neg Hx    Pancreatic cancer Neg Hx     SOCIAL HISTORY:   Social History   Socioeconomic History   Marital status: Married    Spouse name: Not on file   Number of children: Not on file   Years of education: 7   Highest education level: Not on file  Occupational History   Occupation: Disabled    Employer: DISABLED  Tobacco Use   Smoking status: Every Day    Packs/day: 0.50    Years: 49.00    Pack years: 24.50    Types: Cigarettes   Smokeless tobacco: Never   Tobacco comments:    half a pack a day  Vaping Use   Vaping Use: Never used  Substance and Sexual Activity   Alcohol use: Yes    Alcohol/week: 0.0 standard drinks    Comment: 2 drinks a week   Drug use: No   Sexual activity: Not Currently  Other Topics Concern   Not on file  Social History Narrative   Regular exercise-no   Caffeine Use-yes   Lives with wife in a one story home. Has 2 children.  On disability for low back pain.  Used to work as a Games developer, lasted worked in 2007.     Education: 7th grade.   Social Determinants of Health   Financial Resource Strain: Not on file  Food Insecurity: Not on file  Transportation Needs: Not on file  Physical Activity: Not on file  Stress: Not on file  Social Connections: Not on file  Intimate Partner Violence: Not on file    ALLERGIES:    Allergies  Allergen Reactions   Lisinopril Hives and Itching   Amitriptyline Other (See Comments)    Makes  pt feel "weird" and nausea.   Contrast Media [Iodinated Diagnostic Agents] Hives and Itching    Pt can use if taking benadryl    Ibuprofen Other (See Comments)    Internal bleeding    CURRENT MEDICATIONS:    Current Facility-Administered Medications  Medication Dose Route Frequency Provider Last Rate Last Admin   0.9 %  sodium chloride infusion  250 mL Intravenous PRN Wellington Hampshire, MD       acetaminophen (TYLENOL) tablet 650 mg  650 mg Oral Q4H PRN Wellington Hampshire, MD       mupirocin ointment (BACTROBAN) 2 % 1 application  1 application Nasal BID Wellington Hampshire, MD       ondansetron (ZOFRAN) injection 4 mg  4 mg Intravenous Q6H PRN Wellington Hampshire, MD       oxyCODONE-acetaminophen (PERCOCET) 7.5-325 MG per tablet 1 tablet  1 tablet Oral Q6H PRN Wellington Hampshire, MD   1 tablet at 12/27/20 1527   sodium chloride flush (NS) 0.9 % injection 3 mL  3 mL Intravenous Q12H Arida, Muhammad A, MD       sodium chloride flush (NS) 0.9 % injection 3 mL  3 mL Intravenous PRN Wellington Hampshire, MD        REVIEW OF SYSTEMS:   [X]  denotes positive finding, [ ]  denotes negative finding Cardiac  Comments:  Chest pain or chest pressure:    Shortness of breath upon exertion:    Short of breath when lying flat:    Irregular heart rhythm:        Vascular    Pain in calf, thigh, or hip brought on by ambulation: x   Pain in feet at night that wakes you up from your sleep:  x   Blood clot in your veins:    Leg swelling:         Pulmonary    Oxygen at home:    Productive cough:     Wheezing:         Neurologic    Sudden weakness in arms or legs:     Sudden numbness in arms or legs:     Sudden onset of difficulty speaking or slurred speech:    Temporary loss of vision in one eye:     Problems with dizziness:         Gastrointestinal    Blood in stool:      Vomited  blood:         Genitourinary    Burning when urinating:     Blood in urine:        Psychiatric    Major  depression:         Hematologic    Bleeding problems:    Problems with blood clotting too easily:        Skin    Rashes or ulcers:        Constitutional    Fever or chills:     PHYSICAL EXAM:   Vitals:   12/27/20 1245 12/27/20 1342 12/27/20 1517 12/27/20 1530  BP: 128/63 (!) 158/81 137/68 131/67  Pulse: 64 71 69 67  Resp: 14 13 19 13   Temp:  97.7 F (36.5 C) 98.1 F (36.7 C)   TempSrc:  Oral Oral   SpO2: 95% 99% 94% 96%  Weight:      Height:        GENERAL: The patient is a well-nourished male, in no acute distress. The vital signs are documented above. CARDIAC: There is a regular rate and rhythm.  VASCULAR: Palpable femoral pulses bilaterally PULMONARY: Nonlabored respirations ABDOMEN: Soft and non-tender with normal pitched bowel sounds.  MUSCULOSKELETAL: There are no major deformities or cyanosis. NEUROLOGIC: No focal weakness or paresthesias are detected. SKIN: There are no ulcers or rashes noted. PSYCHIATRIC: The patient has a normal affect.  STUDIES:   I have reviewed his angiogram that shows common femoral and profunda stenosis on the right with superficial femoral artery occlusion  ASSESSMENT and PLAN   Right leg rest pain: I had a lengthy discussion with the patient about treatment options.  This would be either right femoral endarterectomy with profundoplasty or right femoral endarterectomy and profundoplasty with femoral-popliteal bypass graft.  I discussed with the patient that I feel just doing a femoral endarterectomy and profundoplasty we will get him out of rest pain.  This should also alleviate his short distance claudication.  He may require bypass graft in the future if his symptoms do not improve.  Before doing this I would like for him to stop smoking to increase the long-term patency of the bypass.  The patient is willing to undergo a femoral endarterectomy.  This will be done tomorrow.  He will be n.p.o. after midnight.  The risks and benefits of the  procedure were discussed with the patient and all questions were answered.   Leia Alf, MD, FACS [facility] Specialists of [facility] [phone] Pager [phone]

## 2020-12-27 NOTE — Progress Notes (Signed)
Pt admitted to rm 2 from cath lab.Initiated teleL CHG wipe given. VSS. Oriented pt to the unit. Call bell within reach.  Lavenia Atlas, RN

## 2020-12-27 NOTE — Progress Notes (Addendum)
Site area: left brachial 5 frech arterial sheath removed by [facility] RTR  Site Prior to Removal:  Level 0  Pressure Applied For 30 MINUTES    Bedrest Beginning at 1050am  Manual:   Yes.    Patient Status During Pull:  stable  Post Pull Groin Site:  Level 0  Post Pull Instructions Given:  Yes.    Post Pull Pulses Present:  Yes.    Dressing Applied:  Yes.    Comments:

## 2020-12-27 NOTE — Progress Notes (Signed)
Obtained the consent form and placed in pt's chart.   Lavenia Atlas, RN

## 2020-12-27 NOTE — H&P (View-Only) (Signed)
Vascular and Vein Specialist of [place]  Patient name: Joshua [name] MRN: [id number] DOB: [birth year] Sex: male   REQUESTING PROVIDER:   Dr. Fletcher [name]   REASON FOR CONSULT:    Right leg rest pain  HISTORY OF PRESENT ILLNESS:   Joshua [name] is a 65 y.o. male, who is status post aortobifemoral bypass graft at [place] many years ago.  He was evaluated in 2019 for worsening right leg claudication.  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  He now is having rest pain in the right leg and claudication in the left.  He underwent angiography earlier today.  This shows that his aortobifemoral graft is widely patent.  He has a high-grade profunda stenosis and a stenosis within the common femoral artery.  The superficial femoral artery is occluded with reconstitution above the knee with two-vessel runoff.  He does not have endovascular options and surgical intervention has been recommended.  He does not have any open wounds.  Patient has a history of coronary artery disease status post PCI.  He is a diabetic with diabetic neuropathy.  He is a current smoker.  He is on a statin for hypercholesterolemia.  PAST MEDICAL HISTORY    Past Medical History:  Diagnosis Date   AC (acromioclavicular) joint bone spurs, unspecified laterality 11/25/2016   Arthritis    "back" (05/16/2015)   CAD in native artery, occluded Lcx and rotational atherectomy to LAD with DES 05/17/15 05/18/2015   Cavitary lesion of lung 02/25/2018   Chronic back pain    "all my back"   Chronic bronchitis (HCC)    COPD (chronic obstructive pulmonary disease) ([place])    Diabetes mellitus without complication (HCC)    GERD (gastroesophageal reflux disease)    H/O blood clots    "had them in my back; put filter in before one of my neck ORs"   History of gout    Hyperlipidemia    Hypertension    Neuromuscular disorder (HCC)     PAD (peripheral artery disease) (HCC)    Phlebitis    Positive TB test    Seasonal allergies    Shortness of breath    Spinal disease      FAMILY HISTORY   Family History  Problem Relation Age of Onset   Stomach cancer Mother        Deceased, 59s   Hypertension Mother    Hypertension Father    Stroke Father    Heart attack Father        Deceased, 13   Aneurysm Brother    Healthy Daughter    Healthy Maternal Grandmother    Leukemia Grandchild    Diabetes Neg Hx    Early death Neg Hx    Colon cancer Neg Hx    Pancreatic cancer Neg Hx     SOCIAL HISTORY:   Social History   Socioeconomic History   Marital status: Married    Spouse name: Not on file   Number of children: Not on file   Years of education: 7   Highest education level: Not on file  Occupational History   Occupation: Disabled    Employer: DISABLED  Tobacco Use   Smoking status: Every Day    Packs/day: 0.50    Years: 49.00    Pack years: 24.50    Types: Cigarettes   Smokeless tobacco: Never   Tobacco comments:    half a pack a day  Vaping Use   Vaping Use: Never used  Substance and Sexual Activity   Alcohol use: Yes    Alcohol/week: 0.0 standard drinks    Comment: 2 drinks a week   Drug use: No   Sexual activity: Not Currently  Other Topics Concern   Not on file  Social History Narrative   Regular exercise-no   Caffeine Use-yes   Lives with wife in a one story home. Has 2 children.  On disability for low back pain.  Used to work as a Games developer, lasted worked in 2007.     Education: 7th grade.   Social Determinants of Health   Financial Resource Strain: Not on file  Food Insecurity: Not on file  Transportation Needs: Not on file  Physical Activity: Not on file  Stress: Not on file  Social Connections: Not on file  Intimate Partner Violence: Not on file    ALLERGIES:    Allergies  Allergen Reactions   Lisinopril Hives and Itching   Amitriptyline Other (See Comments)    Makes  pt feel "weird" and nausea.   Contrast Media [Iodinated Diagnostic Agents] Hives and Itching    Pt can use if taking benadryl    Ibuprofen Other (See Comments)    Internal bleeding    CURRENT MEDICATIONS:    Current Facility-Administered Medications  Medication Dose Route Frequency Provider Last Rate Last Admin   0.9 %  sodium chloride infusion  250 mL Intravenous PRN Wellington Hampshire, MD       acetaminophen (TYLENOL) tablet 650 mg  650 mg Oral Q4H PRN Wellington Hampshire, MD       mupirocin ointment (BACTROBAN) 2 % 1 application  1 application Nasal BID Wellington Hampshire, MD       ondansetron (ZOFRAN) injection 4 mg  4 mg Intravenous Q6H PRN Wellington Hampshire, MD       oxyCODONE-acetaminophen (PERCOCET) 7.5-325 MG per tablet 1 tablet  1 tablet Oral Q6H PRN Wellington Hampshire, MD   1 tablet at 12/27/20 1527   sodium chloride flush (NS) 0.9 % injection 3 mL  3 mL Intravenous Q12H Arida, Muhammad A, MD       sodium chloride flush (NS) 0.9 % injection 3 mL  3 mL Intravenous PRN Wellington Hampshire, MD        REVIEW OF SYSTEMS:   [X]  denotes positive finding, [ ]  denotes negative finding Cardiac  Comments:  Chest pain or chest pressure:    Shortness of breath upon exertion:    Short of breath when lying flat:    Irregular heart rhythm:        Vascular    Pain in calf, thigh, or hip brought on by ambulation: x   Pain in feet at night that wakes you up from your sleep:  x   Blood clot in your veins:    Leg swelling:         Pulmonary    Oxygen at home:    Productive cough:     Wheezing:         Neurologic    Sudden weakness in arms or legs:     Sudden numbness in arms or legs:     Sudden onset of difficulty speaking or slurred speech:    Temporary loss of vision in one eye:     Problems with dizziness:         Gastrointestinal    Blood in stool:      Vomited  blood:         Genitourinary    Burning when urinating:     Blood in urine:        Psychiatric    Major  depression:         Hematologic    Bleeding problems:    Problems with blood clotting too easily:        Skin    Rashes or ulcers:        Constitutional    Fever or chills:     PHYSICAL EXAM:   Vitals:   12/27/20 1245 12/27/20 1342 12/27/20 1517 12/27/20 1530  BP: 128/63 (!) 158/81 137/68 131/67  Pulse: 64 71 69 67  Resp: 14 13 19 13   Temp:  97.7 F (36.5 C) 98.1 F (36.7 C)   TempSrc:  Oral Oral   SpO2: 95% 99% 94% 96%  Weight:      Height:        GENERAL: The patient is a well-nourished male, in no acute distress. The vital signs are documented above. CARDIAC: There is a regular rate and rhythm.  VASCULAR: Palpable femoral pulses bilaterally PULMONARY: Nonlabored respirations ABDOMEN: Soft and non-tender with normal pitched bowel sounds.  MUSCULOSKELETAL: There are no major deformities or cyanosis. NEUROLOGIC: No focal weakness or paresthesias are detected. SKIN: There are no ulcers or rashes noted. PSYCHIATRIC: The patient has a normal affect.  STUDIES:   I have reviewed his angiogram that shows common femoral and profunda stenosis on the right with superficial femoral artery occlusion  ASSESSMENT and PLAN   Right leg rest pain: I had a lengthy discussion with the patient about treatment options.  This would be either right femoral endarterectomy with profundoplasty or right femoral endarterectomy and profundoplasty with femoral-popliteal bypass graft.  I discussed with the patient that I feel just doing a femoral endarterectomy and profundoplasty we will get him out of rest pain.  This should also alleviate his short distance claudication.  He may require bypass graft in the future if his symptoms do not improve.  Before doing this I would like for him to stop smoking to increase the long-term patency of the bypass.  The patient is willing to undergo a femoral endarterectomy.  This will be done tomorrow.  He will be n.p.o. after midnight.  The risks and benefits of the  procedure were discussed with the patient and all questions were answered.   Leia Alf, MD, FACS [facility] Specialists of [facility] [phone] Pager [phone]

## 2020-12-27 NOTE — Progress Notes (Signed)
Mobility Specialist Progress Note:   12/27/20 1610  Therapy Vitals  Pulse Rate 68  BP 119/64  Oxygen Therapy  SpO2 96 %  Mobility  Activity Ambulated in hall  Level of Assistance Contact guard assist, steadying assist  Assistive Device None  Distance Ambulated (ft) 240 ft  Mobility Ambulated with assistance in hallway  Mobility Response Tolerated well  Mobility performed by Mobility specialist  $Mobility charge 1 Mobility   Pre Mobility: HR 68; BP 119/64: SpO2 96% During Mobility: HR 84 bpm Post Mobility: HR 69; BP 131/65; SpO2 94%  Pt received in bed, agreed to mobility. Ambulated in hall 240' with no AD and contact G. Pt walks with minor limp d/t pain in RLE. Required standing breaks x2 d/t SOB. Distance limited to fatigue in RLE and SOB. Pt left in bed with all needs met.     Mobility Specialist  Phone [phone]  

## 2020-12-28 ENCOUNTER — Inpatient Hospital Stay (HOSPITAL_COMMUNITY): Payer: Medicare Other | Admitting: Certified Registered"

## 2020-12-28 ENCOUNTER — Encounter (HOSPITAL_COMMUNITY): Payer: Self-pay | Admitting: Cardiovascular Disease

## 2020-12-28 ENCOUNTER — Ambulatory Visit: Payer: Medicare Other | Admitting: Family Medicine

## 2020-12-28 ENCOUNTER — Encounter (HOSPITAL_COMMUNITY)
Admission: AD | Disposition: A | Discharge status: Home / Self Care | Payer: Self-pay | Source: Home / Self Care | Attending: Cardiovascular Disease

## 2020-12-28 DIAGNOSIS — I70229 Atherosclerosis of native arteries of extremities with rest pain, unspecified extremity: Secondary | ICD-10-CM

## 2020-12-28 DIAGNOSIS — I743 Embolism and thrombosis of arteries of the lower extremities: Secondary | ICD-10-CM

## 2020-12-28 HISTORY — PX: PATCH ANGIOPLASTY: SHX6230

## 2020-12-28 HISTORY — PX: ENDARTERECTOMY FEMORAL: SHX5804

## 2020-12-28 LAB — CBC
HCT: 36.2 % — ABNORMAL LOW (ref 39.0–52.0)
Hemoglobin: 12 g/dL — ABNORMAL LOW (ref 13.0–17.0)
MCH: 34.9 pg — ABNORMAL HIGH (ref 26.0–34.0)
MCHC: 33.1 g/dL (ref 30.0–36.0)
MCV: 105.2 fL — ABNORMAL HIGH (ref 80.0–100.0)
Platelets: 193 10*3/uL (ref 150–400)
RBC: 3.44 MIL/uL — ABNORMAL LOW (ref 4.22–5.81)
RDW: 13.2 % (ref 11.5–15.5)
WBC: 14.6 10*3/uL — ABNORMAL HIGH (ref 4.0–10.5)
nRBC: 0 % (ref 0.0–0.2)

## 2020-12-28 LAB — GLUCOSE, CAPILLARY
Glucose-Capillary: 106 mg/dL — ABNORMAL HIGH (ref 70–99)
Glucose-Capillary: 108 mg/dL — ABNORMAL HIGH (ref 70–99)

## 2020-12-28 LAB — POCT ACTIVATED CLOTTING TIME
Activated Clotting Time: 231 seconds
Activated Clotting Time: 237 seconds

## 2020-12-28 LAB — CREATININE, SERUM
Creatinine, Ser: 0.91 mg/dL (ref 0.61–1.24)
GFR, Estimated: >60 mL/min (ref >60)

## 2020-12-28 SURGERY — ENDARTERECTOMY, FEMORAL
Anesthesia: General | Site: Leg Upper | Laterality: Right

## 2020-12-28 MED ORDER — ALBUTEROL SULFATE (2.5 MG/3ML) 0.083% IN NEBU: 2.5000 mg | INHALATION_SOLUTION | Freq: Four times a day (QID) | RESPIRATORY_TRACT | Status: DC | PRN

## 2020-12-28 MED ORDER — FLUTICASONE-UMECLIDIN-VILANT 100-62.5-25 MCG/INH IN AEPB: 1.0000 | INHALATION_SPRAY | Freq: Every day | RESPIRATORY_TRACT | Status: DC

## 2020-12-28 MED ORDER — ALUM & MAG HYDROXIDE-SIMETH 200-200-20 MG/5ML PO SUSP
15.0000 mL | ORAL | Status: DC | PRN
  Administered 2020-12-28: 30 mL via ORAL
  Filled 2020-12-28: qty 30

## 2020-12-28 MED ORDER — SENNOSIDES-DOCUSATE SODIUM 8.6-50 MG PO TABS: 1.0000 | ORAL_TABLET | Freq: Every evening | ORAL | Status: DC | PRN

## 2020-12-28 MED ORDER — HEPARIN SODIUM (PORCINE) 5000 UNIT/ML IJ SOLN
5000.0000 [IU] | Freq: Three times a day (TID) | INTRAMUSCULAR | Status: DC
  Administered 2020-12-28 – 2020-12-29 (×2): 5000 [IU] via SUBCUTANEOUS
  Filled 2020-12-28 (×2): qty 1

## 2020-12-28 MED ORDER — MIDAZOLAM HCL 2 MG/2ML IJ SOLN
INTRAMUSCULAR | Status: AC
  Filled 2020-12-28: qty 2

## 2020-12-28 MED ORDER — MIDAZOLAM HCL 5 MG/5ML IJ SOLN
INTRAMUSCULAR | Status: DC | PRN
  Administered 2020-12-28: 2 mg via INTRAVENOUS

## 2020-12-28 MED ORDER — FENTANYL CITRATE (PF) 250 MCG/5ML IJ SOLN
INTRAMUSCULAR | Status: DC | PRN
  Administered 2020-12-28 (×2): 50 ug via INTRAVENOUS
  Administered 2020-12-28: 100 ug via INTRAVENOUS
  Administered 2020-12-28 (×2): 50 ug via INTRAVENOUS

## 2020-12-28 MED ORDER — LACTATED RINGERS IV SOLN: INTRAVENOUS | Status: DC | PRN

## 2020-12-28 MED ORDER — ONDANSETRON HCL 4 MG/2ML IJ SOLN: 4.0000 mg | Freq: Four times a day (QID) | INTRAMUSCULAR | Status: DC | PRN

## 2020-12-28 MED ORDER — PROTAMINE SULFATE 10 MG/ML IV SOLN
INTRAVENOUS | Status: DC | PRN
  Administered 2020-12-28: 40 mg via INTRAVENOUS
  Administered 2020-12-28: 10 mg via INTRAVENOUS

## 2020-12-28 MED ORDER — FENTANYL CITRATE (PF) 100 MCG/2ML IJ SOLN
INTRAMUSCULAR | Status: AC
  Filled 2020-12-28: qty 2

## 2020-12-28 MED ORDER — 0.9 % SODIUM CHLORIDE (POUR BTL) OPTIME
TOPICAL | Status: DC | PRN
  Administered 2020-12-28: 1000 mL

## 2020-12-28 MED ORDER — GUAIFENESIN-DM 100-10 MG/5ML PO SYRP: 15.0000 mL | ORAL_SOLUTION | ORAL | Status: DC | PRN

## 2020-12-28 MED ORDER — PHENOL 1.4 % MT LIQD: 1.0000 | OROMUCOSAL | Status: DC | PRN

## 2020-12-28 MED ORDER — MAGNESIUM SULFATE 2 GM/50ML IV SOLN: 2.0000 g | Freq: Every day | INTRAVENOUS | Status: DC | PRN

## 2020-12-28 MED ORDER — CEFAZOLIN SODIUM-DEXTROSE 2-3 GM-%(50ML) IV SOLR
INTRAVENOUS | Status: DC | PRN
  Administered 2020-12-28: 2 g via INTRAVENOUS

## 2020-12-28 MED ORDER — HEPARIN 6000 UNIT IRRIGATION SOLUTION
Status: DC | PRN
  Administered 2020-12-28: 1

## 2020-12-28 MED ORDER — HYDROMORPHONE HCL 1 MG/ML IJ SOLN
0.5000 mg | INTRAMUSCULAR | Status: DC | PRN
  Administered 2020-12-28 (×2): 1 mg via INTRAVENOUS
  Filled 2020-12-28 (×2): qty 1

## 2020-12-28 MED ORDER — OXYCODONE-ACETAMINOPHEN 5-325 MG PO TABS
1.0000 | ORAL_TABLET | ORAL | Status: DC | PRN
  Administered 2020-12-28 – 2020-12-29 (×2): 2 via ORAL
  Filled 2020-12-28 (×2): qty 2

## 2020-12-28 MED ORDER — DOCUSATE SODIUM 100 MG PO CAPS
100.0000 mg | ORAL_CAPSULE | Freq: Every day | ORAL | Status: DC
  Administered 2020-12-29: 100 mg via ORAL
  Filled 2020-12-28: qty 1

## 2020-12-28 MED ORDER — ROCURONIUM BROMIDE 10 MG/ML (PF) SYRINGE
PREFILLED_SYRINGE | INTRAVENOUS | Status: DC | PRN
  Administered 2020-12-28: 60 mg via INTRAVENOUS
  Administered 2020-12-28 (×2): 20 mg via INTRAVENOUS

## 2020-12-28 MED ORDER — METOPROLOL TARTRATE 5 MG/5ML IV SOLN: 2.0000 mg | INTRAVENOUS | Status: DC | PRN

## 2020-12-28 MED ORDER — PROPOFOL 10 MG/ML IV BOLUS
INTRAVENOUS | Status: AC
  Filled 2020-12-28: qty 40

## 2020-12-28 MED ORDER — EPHEDRINE SULFATE-NACL 50-0.9 MG/10ML-% IV SOSY
PREFILLED_SYRINGE | INTRAVENOUS | Status: DC | PRN
  Administered 2020-12-28: 5 mg via INTRAVENOUS

## 2020-12-28 MED ORDER — LOSARTAN POTASSIUM 25 MG PO TABS
25.0000 mg | ORAL_TABLET | Freq: Every day | ORAL | Status: DC
  Administered 2020-12-28 – 2020-12-29 (×2): 25 mg via ORAL
  Filled 2020-12-28 (×2): qty 1

## 2020-12-28 MED ORDER — FENTANYL CITRATE (PF) 250 MCG/5ML IJ SOLN
INTRAMUSCULAR | Status: AC
  Filled 2020-12-28: qty 5

## 2020-12-28 MED ORDER — ASPIRIN EC 81 MG PO TBEC: 81.0000 mg | DELAYED_RELEASE_TABLET | Freq: Every day | ORAL | Status: DC

## 2020-12-28 MED ORDER — SURGIFLO WITH THROMBIN (HEMOSTATIC MATRIX KIT) OPTIME
TOPICAL | Status: DC | PRN
  Administered 2020-12-28: 1 via TOPICAL

## 2020-12-28 MED ORDER — ATORVASTATIN CALCIUM 40 MG PO TABS
40.0000 mg | ORAL_TABLET | Freq: Every day | ORAL | Status: DC
  Administered 2020-12-28 – 2020-12-29 (×2): 40 mg via ORAL
  Filled 2020-12-28 (×2): qty 1

## 2020-12-28 MED ORDER — UMECLIDINIUM-VILANTEROL 62.5-25 MCG/INH IN AEPB
1.0000 | INHALATION_SPRAY | Freq: Every day | RESPIRATORY_TRACT | Status: DC
  Administered 2020-12-29: 1 via RESPIRATORY_TRACT
  Filled 2020-12-28: qty 14

## 2020-12-28 MED ORDER — LIDOCAINE 2% (20 MG/ML) 5 ML SYRINGE
INTRAMUSCULAR | Status: DC | PRN
  Administered 2020-12-28: 80 mg via INTRAVENOUS

## 2020-12-28 MED ORDER — ASPIRIN EC 81 MG PO TBEC
81.0000 mg | DELAYED_RELEASE_TABLET | Freq: Every day | ORAL | Status: DC
  Administered 2020-12-29: 81 mg via ORAL
  Filled 2020-12-28: qty 1

## 2020-12-28 MED ORDER — SODIUM CHLORIDE 0.9 % IV SOLN
500.0000 mL | Freq: Once | INTRAVENOUS | Status: DC | PRN
Start: 2020-12-28 — End: 2020-12-29

## 2020-12-28 MED ORDER — CARVEDILOL 12.5 MG PO TABS
12.5000 mg | ORAL_TABLET | Freq: Two times a day (BID) | ORAL | Status: DC
  Administered 2020-12-28 – 2020-12-29 (×2): 12.5 mg via ORAL
  Filled 2020-12-28 (×2): qty 1

## 2020-12-28 MED ORDER — PROPOFOL 10 MG/ML IV BOLUS
INTRAVENOUS | Status: DC | PRN
  Administered 2020-12-28: 140 mg via INTRAVENOUS

## 2020-12-28 MED ORDER — BISACODYL 5 MG PO TBEC: 5.0000 mg | DELAYED_RELEASE_TABLET | Freq: Every day | ORAL | Status: DC | PRN

## 2020-12-28 MED ORDER — COLCHICINE 0.6 MG PO TABS: 0.6000 mg | ORAL_TABLET | Freq: Two times a day (BID) | ORAL | Status: DC | PRN

## 2020-12-28 MED ORDER — HEPARIN 6000 UNIT IRRIGATION SOLUTION
Status: AC
  Filled 2020-12-28: qty 500

## 2020-12-28 MED ORDER — PANTOPRAZOLE SODIUM 40 MG PO TBEC: 40.0000 mg | DELAYED_RELEASE_TABLET | Freq: Every day | ORAL | Status: DC

## 2020-12-28 MED ORDER — POTASSIUM CHLORIDE CRYS ER 20 MEQ PO TBCR: 20.0000 meq | EXTENDED_RELEASE_TABLET | Freq: Every day | ORAL | Status: DC | PRN

## 2020-12-28 MED ORDER — PANTOPRAZOLE SODIUM 40 MG PO TBEC
40.0000 mg | DELAYED_RELEASE_TABLET | Freq: Two times a day (BID) | ORAL | Status: DC
  Administered 2020-12-28 – 2020-12-29 (×2): 40 mg via ORAL
  Filled 2020-12-28 (×2): qty 1

## 2020-12-28 MED ORDER — LABETALOL HCL 5 MG/ML IV SOLN
10.0000 mg | INTRAVENOUS | Status: DC | PRN
Start: 2020-12-28 — End: 2020-12-29

## 2020-12-28 MED ORDER — ALBUTEROL SULFATE HFA 108 (90 BASE) MCG/ACT IN AERS: 2.0000 | INHALATION_SPRAY | Freq: Four times a day (QID) | RESPIRATORY_TRACT | Status: DC | PRN

## 2020-12-28 MED ORDER — PREGABALIN 100 MG PO CAPS
100.0000 mg | ORAL_CAPSULE | Freq: Every day | ORAL | Status: DC
  Administered 2020-12-28 – 2020-12-29 (×2): 100 mg via ORAL
  Filled 2020-12-28 (×2): qty 1

## 2020-12-28 MED ORDER — SUGAMMADEX SODIUM 200 MG/2ML IV SOLN
INTRAVENOUS | Status: DC | PRN
  Administered 2020-12-28: 200 mg via INTRAVENOUS

## 2020-12-28 MED ORDER — ONDANSETRON HCL 4 MG/2ML IJ SOLN
INTRAMUSCULAR | Status: DC | PRN
  Administered 2020-12-28: 4 mg via INTRAVENOUS

## 2020-12-28 MED ORDER — FENTANYL CITRATE (PF) 100 MCG/2ML IJ SOLN: 25.0000 ug | INTRAMUSCULAR | Status: DC | PRN

## 2020-12-28 MED ORDER — ACETAMINOPHEN 500 MG PO TABS: 1000.0000 mg | ORAL_TABLET | Freq: Four times a day (QID) | ORAL | Status: DC | PRN

## 2020-12-28 MED ORDER — SODIUM CHLORIDE 0.9 % IV SOLN: INTRAVENOUS | Status: DC

## 2020-12-28 MED ORDER — METHOCARBAMOL 500 MG PO TABS: 500.0000 mg | ORAL_TABLET | Freq: Four times a day (QID) | ORAL | Status: DC | PRN

## 2020-12-28 MED ORDER — ALBUTEROL SULFATE HFA 108 (90 BASE) MCG/ACT IN AERS
INHALATION_SPRAY | RESPIRATORY_TRACT | Status: DC | PRN
  Administered 2020-12-28: 2 via RESPIRATORY_TRACT

## 2020-12-28 MED ORDER — TRAZODONE HCL 100 MG PO TABS: 100.0000 mg | ORAL_TABLET | Freq: Every evening | ORAL | Status: DC | PRN

## 2020-12-28 MED ORDER — HYDRALAZINE HCL 20 MG/ML IJ SOLN: 5.0000 mg | INTRAMUSCULAR | Status: DC | PRN

## 2020-12-28 MED ORDER — CEFAZOLIN SODIUM-DEXTROSE 2-4 GM/100ML-% IV SOLN
2.0000 g | Freq: Three times a day (TID) | INTRAVENOUS | Status: AC
Start: 2020-12-28 — End: 2020-12-29
  Administered 2020-12-28 (×2): 2 g via INTRAVENOUS
  Filled 2020-12-28 (×2): qty 100

## 2020-12-28 MED ORDER — DEXAMETHASONE SODIUM PHOSPHATE 10 MG/ML IJ SOLN
INTRAMUSCULAR | Status: DC | PRN
  Administered 2020-12-28: 10 mg via INTRAVENOUS

## 2020-12-28 MED ORDER — PHENYLEPHRINE 40 MCG/ML (10ML) SYRINGE FOR IV PUSH (FOR BLOOD PRESSURE SUPPORT)
PREFILLED_SYRINGE | INTRAVENOUS | Status: DC | PRN
  Administered 2020-12-28: 80 ug via INTRAVENOUS
  Administered 2020-12-28: 120 ug via INTRAVENOUS
  Administered 2020-12-28: 160 ug via INTRAVENOUS

## 2020-12-28 MED ORDER — DULOXETINE HCL 60 MG PO CPEP
60.0000 mg | ORAL_CAPSULE | Freq: Every day | ORAL | Status: DC
  Administered 2020-12-28 – 2020-12-29 (×2): 60 mg via ORAL
  Filled 2020-12-28 (×2): qty 1

## 2020-12-28 MED ORDER — PHENYLEPHRINE HCL-NACL 20-0.9 MG/250ML-% IV SOLN
INTRAVENOUS | Status: DC | PRN
Start: 2020-12-28 — End: 2020-12-28
  Administered 2020-12-28: 30 ug/min via INTRAVENOUS

## 2020-12-28 MED ORDER — HEPARIN SODIUM (PORCINE) 1000 UNIT/ML IJ SOLN
INTRAMUSCULAR | Status: DC | PRN
  Administered 2020-12-28: 9000 [IU] via INTRAVENOUS

## 2020-12-28 SURGICAL SUPPLY — 48 items
BAG COUNTER SPONGE SURGICOUNT (BAG) ×3 IMPLANT
CANISTER SUCT 3000ML PPV (MISCELLANEOUS) ×3 IMPLANT
CATH EMB 5FR 80CM (CATHETERS) ×1 IMPLANT
CLIP VESOCCLUDE MED 24/CT (CLIP) ×3 IMPLANT
CLIP VESOCCLUDE SM WIDE 24/CT (CLIP) ×3 IMPLANT
DERMABOND ADHESIVE PROPEN (GAUZE/BANDAGES/DRESSINGS) ×1
DERMABOND ADVANCED (GAUZE/BANDAGES/DRESSINGS) ×1
DERMABOND ADVANCED .7 DNX12 (GAUZE/BANDAGES/DRESSINGS) ×2 IMPLANT
DERMABOND ADVANCED .7 DNX6 (GAUZE/BANDAGES/DRESSINGS) IMPLANT
DRAIN CHANNEL 15F RND FF W/TCR (WOUND CARE) IMPLANT
DRAPE X-RAY CASS 24X20 (DRAPES) IMPLANT
ELECT REM PT RETURN 9FT ADLT (ELECTROSURGICAL) ×3
ELECTRODE REM PT RTRN 9FT ADLT (ELECTROSURGICAL) ×2 IMPLANT
EVACUATOR SILICONE 100CC (DRAIN) IMPLANT
GLOVE SRG 8 PF TXTR STRL LF DI (GLOVE) ×2 IMPLANT
GLOVE SURG POLYISO LF SZ7.5 (GLOVE) ×3 IMPLANT
GLOVE SURG UNDER POLY LF SZ8 (GLOVE) ×2
GOWN STRL REUS W/ TWL LRG LVL3 (GOWN DISPOSABLE) ×4 IMPLANT
GOWN STRL REUS W/ TWL XL LVL3 (GOWN DISPOSABLE) ×2 IMPLANT
GOWN STRL REUS W/TWL LRG LVL3 (GOWN DISPOSABLE) ×2
GOWN STRL REUS W/TWL XL LVL3 (GOWN DISPOSABLE) ×1
HEMOSTAT SNOW SURGICEL 2X4 (HEMOSTASIS) IMPLANT
KIT BASIN OR (CUSTOM PROCEDURE TRAY) ×3 IMPLANT
KIT TURNOVER KIT B (KITS) ×3 IMPLANT
NS IRRIG 1000ML POUR BTL (IV SOLUTION) ×6 IMPLANT
PACK PERIPHERAL VASCULAR (CUSTOM PROCEDURE TRAY) ×3 IMPLANT
PAD ARMBOARD 7.5X6 YLW CONV (MISCELLANEOUS) ×6 IMPLANT
PATCH HEMASHIELD 8X75 (Vascular Products) ×1 IMPLANT
SET COLLECT BLD 21X3/4 12 (NEEDLE) IMPLANT
SET WALTER ACTIVATION W/DRAPE (SET/KITS/TRAYS/PACK) IMPLANT
SPONGE INTESTINAL PEANUT (DISPOSABLE) ×4 IMPLANT
SPONGE T-LAP 18X18 ~~LOC~~+RFID (SPONGE) ×1 IMPLANT
STOPCOCK 4 WAY LG BORE MALE ST (IV SETS) ×1 IMPLANT
SURGIFLO W/THROMBIN 8M KIT (HEMOSTASIS) ×1 IMPLANT
SUT ETHILON 3 0 PS 1 (SUTURE) IMPLANT
SUT MNCRL AB 4-0 PS2 18 (SUTURE) ×1 IMPLANT
SUT PROLENE 5 0 C 1 24 (SUTURE) ×7 IMPLANT
SUT PROLENE 6 0 BV (SUTURE) ×4 IMPLANT
SUT VIC AB 2-0 CT1 27 (SUTURE) ×1
SUT VIC AB 2-0 CT1 TAPERPNT 27 (SUTURE) ×2 IMPLANT
SUT VIC AB 3-0 SH 27 (SUTURE) ×1
SUT VIC AB 3-0 SH 27X BRD (SUTURE) ×2 IMPLANT
SUT VIC AB 3-0 X1 27 (SUTURE) ×3 IMPLANT
SYR 3ML LL SCALE MARK (SYRINGE) ×1 IMPLANT
TOWEL GREEN STERILE (TOWEL DISPOSABLE) ×3 IMPLANT
TUBING EXTENTION W/L.L. (IV SETS) ×1 IMPLANT
UNDERPAD 30X36 HEAVY ABSORB (UNDERPADS AND DIAPERS) ×3 IMPLANT
WATER STERILE IRR 1000ML POUR (IV SOLUTION) ×3 IMPLANT

## 2020-12-28 NOTE — Transfer of Care (Signed)
Immediate Anesthesia Transfer of Care Note  Patient: Joshua [name]  Procedure(s) Performed: RIGHT FEMORAL ARTERY ENDARTERECTOMY (Right: Leg Upper) PATCH ANGIOPLASTY USING (Right: Groin)  Patient Location: PACU  Anesthesia Type:General  Level of Consciousness: awake, alert  and oriented  Airway & Oxygen Therapy: Patient Spontanous Breathing  Post-op Assessment: Report given to RN and Post -op Vital signs reviewed and stable  Post vital signs: Reviewed and stable  Last Vitals:  Vitals Value Taken Time  BP 107/68 12/28/20 1115  Temp 36.6 C 12/28/20 1114  Pulse 65 12/28/20 1119  Resp 13 12/28/20 1119  SpO2 93 % 12/28/20 1119  Vitals shown include unvalidated device data.  Last Pain:  Vitals:   12/28/20 1114  TempSrc: Oral  PainSc:       Patients Stated Pain Goal: 1 (85/63/14 9702)  Complications: No notable events documented.

## 2020-12-28 NOTE — Progress Notes (Signed)
Pt transported to the OR  by the OR staff. This RN attempted calling report three times to the number [phone], but there was no response.

## 2020-12-28 NOTE — Progress Notes (Signed)
D/c arterial line per order. Pt tolerated well.   Lavenia Atlas, RN

## 2020-12-28 NOTE — Op Note (Signed)
Patient name: Joshua [name] MRN: [id number] DOB: [birth year] Sex: male  12/28/2020 Pre-operative Diagnosis: Right leg rest pain Post-operative diagnosis:  Same Surgeon:  [name] Assistants: Laurence Slate Procedure:   #1: Right femoral endarterectomy with Dacron patch angioplasty   #2: Redo right femoral artery exposure   #3: Right iliofemoral thrombectomy Anesthesia: General Blood Loss: 100 cc Specimens: None  Findings: A long segment of laminated thrombus was pulled out of the right limb of aortobifemoral graft.  This was what was causing the stenosis within the graft.  There is also heavily calcified lesion at the ostium of the profundofemoral artery which was endarterectomized.  A dacryon patch was then placed.  Indications: This is a 65 year old gentleman with history of aortobifemoral bypass graft performed at [facility].  He has developed rest pain in his right leg.  Angiography today revealed a common femoral profundofemoral and superficial femoral artery stenosis.  I discussed proceeding with femoral endarterectomy to take him out of rest pain.  Procedure:  The patient was identified in the holding area and taken to [facility]  The patient was then placed supine on the table. general anesthesia was administered.  The patient was prepped and draped in the usual sterile fashion.  A time out was called and antibiotics were administered.  A PA was necessary to explain the procedure and assist with technical details.  The patient's previous longitudinal right groin incision was opened with a 10 blade.  Cautery was used divide subcutaneous tissue.  Using a combination of cautery and sharp dissection I exposed the right limb of the aortobifemoral graft.  The anastomosis was to the common femoral artery.  I then individually isolated the superficial femoral artery as well as the profunda femoral artery, going down to the primary branches which were individually isolated.  Once  I had adequate exposure, the patient was fully heparinized.  After the heparin circulated, the vessels were occluded.  #11 blade was used to make an arteriotomy in the common femoral artery.  This was extended up onto the right limb of the aortobifemoral graft.  I then opened up the profundofemoral artery for approximately 1.5 cm.  I performed endarterectomy of the proximal profundofemoral artery and the common femoral artery.  There is laminated thrombus at the graft to common femoral anastomosis which was removed with DeBakey forceps.  I then inserted a #5 Fogarty balloon into the right limb of the graft for proximal control and removed additional laminated thrombus.  I then used the Fogarty to perform a thrombectomy and removed a long segment of laminated thrombus.  After this there was significant improvement in the inflow. Was irrigated.  A dacryon patch was selected.  Patch angioplasty was performed using running 5-0 Prolene.  Prior to completion the appropriate flushing maneuvers were performed and the anastomosis was completed.  Blood flow was reestablished to the right leg.  Several repair stitches were required for hemostasis.  The patient had a triphasic profundofemoral Doppler signal after the procedure.  Next, the patient's heparin was reversed with 50 mg of protamine.  The wound was irrigated.  Hemostasis was achieved.  The femoral sheath was reapproximated with 2-0 Vicryl.  The subcutaneous tissue was then closed with multiple layers of 3-0 Vicryl followed by subcuticular closure.  Dermabond was applied on the wound.  There were no immediate complications.   Disposition: To PACU stable.   Theotis Burrow, M.D., [place] [facility] [place] Office: [phone] Pager:  [phone] 

## 2020-12-28 NOTE — Progress Notes (Signed)
Progress Note  Patient Name: Joshua [name] Date of Encounter: 12/28/2020  [facility] Cardiologist: Candee Furbish, MD   Subjective   Pt just came out of the OR  Inpatient Medications    Scheduled Meds:  fentaNYL       influenza vac split quadrivalent PF  0.5 mL Intramuscular Tomorrow-1000   midazolam       [MAR Hold] mupirocin ointment  1 application Nasal BID   [MAR Hold] sodium chloride flush  3 mL Intravenous Q12H   Continuous Infusions:  [MAR Hold] sodium chloride     [MAR Hold]  ceFAZolin (ANCEF) IV     PRN Meds: [MAR Hold] sodium chloride, [MAR Hold] acetaminophen, fentaNYL (SUBLIMAZE) injection, [MAR Hold] ondansetron (ZOFRAN) IV, [MAR Hold] oxyCODONE-acetaminophen, [MAR Hold] sodium chloride flush   Vital Signs    Vitals:   12/27/20 1700 12/27/20 1951 12/27/20 2344 12/28/20 0330  BP: (!) 110/58 (!) 125/58 115/63 120/75  Pulse: 71 70 69 64  Resp: 15 14 (!) 21 13  Temp:  98.4 F (36.9 C) 98 F (36.7 C) 98 F (36.7 C)  TempSrc:  Oral Oral Oral  SpO2: 95% 95% 91% 96%  Weight:      Height:        Intake/Output Summary (Last 24 hours) at 12/28/2020 1017 Last data filed at 12/28/2020 1010 Gross per 24 hour  Intake 1758.33 ml  Output 530 ml  Net 1228.33 ml   Last 3 Weights 12/27/2020 12/19/2020 12/07/2020  Weight (lbs) 186 lb 185 lb 183 lb  Weight (kg) 84.369 kg 83.915 kg 83.008 kg      Telemetry   Sinus-personally reviewed  ECG    Physical Exam   GEN: No acute distress.   Neuro:  Nonfocal  Psych: Normal affect  CV:RRR Pulm: Lungs clear  Labs    High Sensitivity Troponin:  No results for input(s): TROPONINIHS in the last 720 hours.   ChemistryNo results for input(s): NA, K, CL, CO2, GLUCOSE, BUN, CREATININE, CALCIUM, MG, PROT, ALBUMIN, AST, ALT, ALKPHOS, BILITOT, GFRNONAA, GFRAA, ANIONGAP in the last 168 hours.  Lipids No results for input(s): CHOL, TRIG, HDL, LABVLDL, LDLCALC, CHOLHDL in the last 168 hours.  HematologyNo results for  input(s): WBC, RBC, HGB, HCT, MCV, MCH, MCHC, RDW, PLT in the last 168 hours. Thyroid No results for input(s): TSH, FREET4 in the last 168 hours.  BNPNo results for input(s): BNP, PROBNP in the last 168 hours.  DDimer No results for input(s): DDIMER in the last 168 hours.   Radiology    PERIPHERAL VASCULAR CATHETERIZATION  Result Date: 12/27/2020 1. Patent aortobifemoral bypass. 2. Right lower extremity: Significant stenosis in the common femoral artery at the outflow of the aortofemoral bypass, severe calcified ostial profundus stenosis with flush occlusion in the SFA with reconstitution in the distal segment with moderate focal calcified stenosis of the popliteal artery and two-vessel runoff below the knee. 3.  Left lower extremity: Medium size pseudoaneurysmal area in the common femoral area at the outflow of the anastomosis of aortofemoral bypass with moderate calcified disease extending into the ostium of the profunda and severe calcified stenosis in the ostial SFA with significant calcified stenosis in the popliteal artery and three-vessel runoff below the knee. Recommendations: The patient has rest pain on the right side and severe claudication on the left side.  No ulceration. Recommend right common femoral artery endarterectomy with profundoplasty plus minus femoral to above-the-knee popliteal bypass. On the left side, the patient might also require common femoral  artery endarterectomy into the ostium of the SFA.  Endovascular intervention on the left side might be possible but high risk.    Cardiac Studies     Patient Profile     65 y.o. male with severe PAD, DM, hyperlipidemia, HTN transferred to [facility] for vascular surgery  Assessment & Plan    PAD: He underwent right femoral endarterectomy with Dacron Patch angioplasty and right iliofemoral thrombectomy this am per Dr. Trula Slade. He is doing well. Sinus on tele. BP stable.   For questions or updates, please contact [place] Please consult [email or web address] for contact info under        Signed, Lauree Chandler, MD  12/28/2020, 10:17 AM

## 2020-12-28 NOTE — Interval H&P Note (Signed)
History and Physical Interval Note:  12/28/2020 7:24 AM  Joshua [name]  has presented today for surgery, with the diagnosis of Critical Limb Ischemia.  The various methods of treatment have been discussed with the patient and family. After consideration of risks, benefits and other options for treatment, the patient has consented to  Procedure(s): RIGHT FEMORAL ARTERY ENDARTERECTOMY (Right) as a surgical intervention.  The patient's history has been reviewed, patient examined, no change in status, stable for surgery.  I have reviewed the patient's chart and labs.  Questions were answered to the patient's satisfaction.     [name]

## 2020-12-28 NOTE — Anesthesia Procedure Notes (Signed)
Arterial Line Insertion Start/End10/13/2022 7:00 AM, 12/28/2020 7:10 AM Performed by: Darral Dash, DO, Josephine Igo, CRNA, CRNA  Patient location: Pre-op. Preanesthetic checklist: patient identified, IV checked, site marked, risks and benefits discussed, surgical consent, monitors and equipment checked, pre-op evaluation, timeout performed and anesthesia consent Lidocaine 1% used for infiltration Right, radial was placed Catheter size: 20 G Hand hygiene performed  and maximum sterile barriers used   Attempts: 1 Procedure performed without using ultrasound guided technique. Following insertion, dressing applied and Biopatch. Post procedure assessment: normal and unchanged

## 2020-12-28 NOTE — Anesthesia Procedure Notes (Signed)
Procedure Name: Intubation Date/Time: 12/28/2020 7:43 AM Performed by: Griffin Dakin, CRNA Pre-anesthesia Checklist: Patient identified, Emergency Drugs available, Suction available and Patient being monitored Patient Re-evaluated:Patient Re-evaluated prior to induction Oxygen Delivery Method: Circle system utilized Preoxygenation: Pre-oxygenation with 100% oxygen Induction Type: IV induction Ventilation: Mask ventilation without difficulty Laryngoscope Size: Mac and 4 Grade View: Grade II Tube type: Oral Tube size: 7.5 mm Number of attempts: 1 Airway Equipment and Method: Stylet and Oral airway Placement Confirmation: ETT inserted through vocal cords under direct vision, positive ETCO2 and breath sounds checked- equal and bilateral Secured at: 24 cm Tube secured with: Tape Dental Injury: Teeth and Oropharynx as per pre-operative assessment

## 2020-12-28 NOTE — Progress Notes (Signed)
Pt came back to rm 2 from PACU. Reinitiated tele. Call bell within reach.   Lavenia Atlas, RN

## 2020-12-28 NOTE — Anesthesia Postprocedure Evaluation (Signed)
Anesthesia Post Note  Patient: Joshua [name]  Procedure(s) Performed: RIGHT FEMORAL ARTERY ENDARTERECTOMY (Right: Leg Upper) PATCH ANGIOPLASTY USING (Right: Groin)     Patient location during evaluation: PACU Anesthesia Type: General Level of consciousness: awake and alert Pain management: pain level controlled Vital Signs Assessment: post-procedure vital signs reviewed and stable Respiratory status: spontaneous breathing, nonlabored ventilation, respiratory function stable and patient connected to nasal cannula oxygen Cardiovascular status: blood pressure returned to baseline and stable Postop Assessment: no apparent nausea or vomiting Anesthetic complications: no   No notable events documented.  Last Vitals:  Vitals:   12/28/20 1114 12/28/20 1200  BP: 114/65 111/63  Pulse: 68 65  Resp: 20 20  Temp: 36.6 C   SpO2: 94% 96%    Last Pain:  Vitals:   12/28/20 1114  TempSrc: Oral  PainSc:                  March Rummage Jakyrah Holladay

## 2020-12-28 NOTE — Telephone Encounter (Signed)
Per Epic patient is currently admitted in the hospital.

## 2020-12-28 NOTE — Progress Notes (Signed)
  Day of Surgery Note    Subjective:  Post-op pain controlled. Wife at bedside. Arterial line is not accurate and continuously alarming.   Vitals:   12/28/20 1114 12/28/20 1200  BP: 114/65 111/63  Pulse: 68 65  Resp: 20 20  Temp: 97.9 F (36.6 C)   SpO2: 94% 96%    General appearance: Awake, alert in no apparent distress Cardiac: Heart rate and rhythm are regular Respirations: Non-labored, +exp wheezing Incisions: Right groin incision well approximated without bleeding or hematoma Extremities: Both feet are warm with intact sensation and motor function.   Pulse/Doppler exam:  Brisk right dorsalis pedis and peroneal artery Doppler signals. Weak PT sginal  Assessment/Plan:  This is a 65 y.o. male who is s/p right femoral endarterectomy with Dacron Patch angioplasty and right iliofemoral thrombectomy by Dr. Trula Slade due to RLE rest pain. VSS.   DC art line  -His wife works from [place] -12 noon and I explained he could DC after noon tomorrow if stable.   Risa Grill, PA-C 12/28/2020 12:49 PM [phone]

## 2020-12-28 NOTE — Discharge Instructions (Signed)
 [facility] [place]  Discharge instructions  Lower Extremity Bypass Surgery  Please refer to the following instruction for your post-procedure care. Your surgeon or physician assistant will discuss any changes with you.  Activity  You are encouraged to walk as much as you can. You can slowly return to normal activities during the month after your surgery. Avoid strenuous activity and heavy lifting until your doctor tells you it's OK. Avoid activities such as vacuuming or swinging a golf club. Do not drive until your doctor give the OK and you are no longer taking prescription pain medications. It is also normal to have difficulty with sleep habits, eating and bowel movement after surgery. These will go away with time.  Bathing/Showering  You may shower after you go home. Do not soak in a bathtub, hot tub, or swim until the incision heals completely.  Incision Care  Clean your incision with mild soap and water. Shower every day. Pat the area dry with a clean towel. You do not need a bandage unless otherwise instructed. Do not apply any ointments or creams to your incision. If you have open wounds you will be instructed how to care for them or a visiting nurse may be arranged for you. If you have staples or sutures along your incision they will be removed at your post-op appointment. You may have skin glue on your incision. Do not peel it off. It will come off on its own in about one week. If you have a great deal of moisture in your groin, use a gauze help keep this area dry.  Diet  Resume your normal diet. There are no special food restrictions following this procedure. A low fat/ low cholesterol diet is recommended for all patients with vascular disease. In order to heal from your surgery, it is CRITICAL to get adequate nutrition. Your body requires vitamins, minerals, and protein. Vegetables are the best source of vitamins and minerals. Vegetables also provide the  perfect balance of protein. Processed food has little nutritional value, so try to avoid this.  Medications  Resume taking all your medications unless your doctor or nurse practitioner tells you not to. If your incision is causing pain, you may take over-the-counter pain relievers such as acetaminophen (Tylenol). If you were prescribed a stronger pain medication, please aware these medication can cause nausea and constipation. Prevent nausea by taking the medication with a snack or meal. Avoid constipation by drinking plenty of fluids and eating foods with high amount of fiber, such as fruits, vegetables, and grains. Take Colase 100 mg (an over-the-counter stool softener) twice a day as needed for constipation. Do not take Tylenol if you are taking prescription pain medications.  Follow Up  Our office will schedule a follow up appointment 2-3 weeks following discharge.  Please call us immediately for any of the following conditions  Severe or worsening pain in your legs or feet while at rest or while walking Increase pain, redness, warmth, or drainage (pus) from your incision site(s) Fever of 101 degree or higher The swelling in your leg with the bypass suddenly worsens and becomes more painful than when you were in the hospital If you have been instructed to feel your graft pulse then you should do so every day. If you can no longer feel this pulse, call the office immediately. Not all patients are given this instruction.  Leg swelling is common after leg bypass surgery.  The swelling should improve over a few months   following surgery. To improve the swelling, you may elevate your legs above the level of your heart while you are sitting or resting. Your surgeon or physician assistant may ask you to apply an ACE wrap or wear compression (TED) stockings to help to reduce swelling.  Reduce your risk of vascular disease  Stop smoking. If you would like help call [facility] at 1-800-QUIT-NOW  (1-800-784-8669) or [place] at [phone].  Manage your cholesterol Maintain a desired weight Control your diabetes weight Control your diabetes Keep your blood pressure down  If you have any questions, please call the office at [phone]   

## 2020-12-29 ENCOUNTER — Encounter (HOSPITAL_COMMUNITY): Payer: Self-pay | Admitting: Surgery

## 2020-12-29 ENCOUNTER — Other Ambulatory Visit (HOSPITAL_COMMUNITY): Payer: Self-pay

## 2020-12-29 DIAGNOSIS — I480 Paroxysmal atrial fibrillation: Secondary | ICD-10-CM

## 2020-12-29 DIAGNOSIS — I70229 Atherosclerosis of native arteries of extremities with rest pain, unspecified extremity: Secondary | ICD-10-CM | POA: Diagnosis not present

## 2020-12-29 LAB — CBC
HCT: 34.5 % — ABNORMAL LOW (ref 39.0–52.0)
Hemoglobin: 11.5 g/dL — ABNORMAL LOW (ref 13.0–17.0)
MCH: 35.3 pg — ABNORMAL HIGH (ref 26.0–34.0)
MCHC: 33.3 g/dL (ref 30.0–36.0)
MCV: 105.8 fL — ABNORMAL HIGH (ref 80.0–100.0)
Platelets: 193 10*3/uL (ref 150–400)
RBC: 3.26 MIL/uL — ABNORMAL LOW (ref 4.22–5.81)
RDW: 12.9 % (ref 11.5–15.5)
WBC: 18.7 10*3/uL — ABNORMAL HIGH (ref 4.0–10.5)
nRBC: 0 % (ref 0.0–0.2)

## 2020-12-29 LAB — BASIC METABOLIC PANEL
Anion gap: 7 (ref 5–15)
BUN: 13 mg/dL (ref 8–23)
CO2: 27 mmol/L (ref 22–32)
Calcium: 8.5 mg/dL — ABNORMAL LOW (ref 8.9–10.3)
Chloride: 101 mmol/L (ref 98–111)
Creatinine, Ser: 0.78 mg/dL (ref 0.61–1.24)
GFR, Estimated: >60 mL/min (ref >60)
Glucose, Bld: 120 mg/dL — ABNORMAL HIGH (ref 70–99)
Potassium: 4 mmol/L (ref 3.5–5.1)
Sodium: 135 mmol/L (ref 135–145)

## 2020-12-29 LAB — HEMOGLOBIN A1C
Hgb A1c MFr Bld: 6 % — ABNORMAL HIGH (ref 4.8–5.6)
Mean Plasma Glucose: 125.5 mg/dL

## 2020-12-29 LAB — LIPID PANEL
Cholesterol: 203 mg/dL — ABNORMAL HIGH (ref 0–200)
HDL: 50 mg/dL (ref >40)
LDL Cholesterol: 132 mg/dL — ABNORMAL HIGH (ref 0–99)
Total CHOL/HDL Ratio: 4.1 RATIO
Triglycerides: 103 mg/dL (ref <150)
VLDL: 21 mg/dL (ref 0–40)

## 2020-12-29 MED ORDER — EZETIMIBE 10 MG PO TABS
10.0000 mg | ORAL_TABLET | Freq: Every day | ORAL | 3 refills | Status: DC
  Filled 2020-12-29: qty 90, 90d supply, fill #0

## 2020-12-29 MED ORDER — ATORVASTATIN CALCIUM 80 MG PO TABS
80.0000 mg | ORAL_TABLET | Freq: Every day | ORAL | 3 refills | Status: DC
  Filled 2020-12-29: qty 90, 90d supply, fill #0

## 2020-12-29 MED ORDER — OXYCODONE-ACETAMINOPHEN 5-325 MG PO TABS: 1.0000 | ORAL_TABLET | ORAL | 0 refills | Status: DC | PRN

## 2020-12-29 NOTE — Progress Notes (Signed)
Progress Note  Patient Name: ASKARI KINLEY Date of Encounter: 12/29/2020  [facility] Cardiologist: Candee Furbish, MD   Subjective   No complaints.   Inpatient Medications    Scheduled Meds:  aspirin EC  81 mg Oral Daily   atorvastatin  40 mg Oral Daily   carvedilol  12.5 mg Oral BID WC   docusate sodium  100 mg Oral Daily   DULoxetine  60 mg Oral Daily   heparin  5,000 Units Subcutaneous Q8H   influenza vac split quadrivalent PF  0.5 mL Intramuscular Tomorrow-1000   losartan  25 mg Oral Daily   mupirocin ointment  1 application Nasal BID   pantoprazole  40 mg Oral BID AC   pregabalin  100 mg Oral Daily   umeclidinium-vilanterol  1 puff Inhalation Daily   Continuous Infusions:  sodium chloride     sodium chloride     magnesium sulfate bolus IVPB     PRN Meds: sodium chloride, acetaminophen, albuterol, alum & mag hydroxide-simeth, bisacodyl, colchicine, guaiFENesin-dextromethorphan, hydrALAZINE, HYDROmorphone (DILAUDID) injection, labetalol, magnesium sulfate bolus IVPB, methocarbamol, metoprolol tartrate, ondansetron, oxyCODONE-acetaminophen, phenol, potassium chloride, senna-docusate, traZODone   Vital Signs    Vitals:   12/28/20 1601 12/28/20 2015 12/28/20 2330 12/29/20 0454  BP: 103/68 (!) 143/71 (!) 148/77 (!) 144/73  Pulse: 63 69 63 61  Resp: 20 18 20 20   Temp: (!) 97.5 F (36.4 C) 97.8 F (36.6 C) 97.9 F (36.6 C) 97.6 F (36.4 C)  TempSrc: Oral Oral Oral Oral  SpO2: 95% 96% 96% 96%  Weight:      Height:        Intake/Output Summary (Last 24 hours) at 12/29/2020 0841 Last data filed at 12/28/2020 2333 Gross per 24 hour  Intake 1028.9 ml  Output 400 ml  Net 628.9 ml   Last 3 Weights 12/27/2020 12/19/2020 12/07/2020  Weight (lbs) 186 lb 185 lb 183 lb  Weight (kg) 84.369 kg 83.915 kg 83.008 kg      Telemetry   Sinus-personally reviewed  ECG    Physical Exam    General: Well developed, well nourished, NAD  HEENT: OP clear, mucus  membranes moist  SKIN: warm, dry. No rashes. Neuro: No focal deficits  Musculoskeletal: Muscle strength 5/5 all ext  Psychiatric: Mood and affect normal  Neck: No JVD,  Lungs:Clear bilaterally, no wheezes, rhonci, crackles Cardiovascular: Regular rate and rhythm. No murmurs, gallops or rubs. Abdomen:Soft. B Extremities: No lower extremity edema.    Labs    High Sensitivity Troponin:  No results for input(s): TROPONINIHS in the last 720 hours.   Chemistry Recent Labs  Lab 12/28/20 1105 12/29/20 0256  NA  --  135  K  --  4.0  CL  --  101  CO2  --  27  GLUCOSE  --  120*  BUN  --  13  CREATININE 0.91 0.78  CALCIUM  --  8.5*  GFRNONAA >60 >60  ANIONGAP  --  7    Lipids  Recent Labs  Lab 12/29/20 0256  CHOL 203*  TRIG 103  HDL 50  LDLCALC 132*  CHOLHDL 4.1    Hematology Recent Labs  Lab 12/28/20 1105 12/29/20 0256  WBC 14.6* 18.7*  RBC 3.44* 3.26*  HGB 12.0* 11.5*  HCT 36.2* 34.5*  MCV 105.2* 105.8*  MCH 34.9* 35.3*  MCHC 33.1 33.3  RDW 13.2 12.9  PLT 193 193   Thyroid No results for input(s): TSH, FREET4 in the last 168 hours.  BNPNo results for input(s): BNP, PROBNP in the last 168 hours.  DDimer No results for input(s): DDIMER in the last 168 hours.   Radiology    PERIPHERAL VASCULAR CATHETERIZATION  Result Date: 12/27/2020 1. Patent aortobifemoral bypass. 2. Right lower extremity: Significant stenosis in the common femoral artery at the outflow of the aortofemoral bypass, severe calcified ostial profundus stenosis with flush occlusion in the SFA with reconstitution in the distal segment with moderate focal calcified stenosis of the popliteal artery and two-vessel runoff below the knee. 3.  Left lower extremity: Medium size pseudoaneurysmal area in the common femoral area at the outflow of the anastomosis of aortofemoral bypass with moderate calcified disease extending into the ostium of the profunda and severe calcified stenosis in the ostial SFA with  significant calcified stenosis in the popliteal artery and three-vessel runoff below the knee. Recommendations: The patient has rest pain on the right side and severe claudication on the left side.  No ulceration. Recommend right common femoral artery endarterectomy with profundoplasty plus minus femoral to above-the-knee popliteal bypass. On the left side, the patient might also require common femoral artery endarterectomy into the ostium of the SFA.  Endovascular intervention on the left side might be possible but high risk.    Cardiac Studies     Patient Profile     65 y.o. male with severe PAD, DM, hyperlipidemia, HTN transferred to [facility] for vascular surgery  Assessment & Plan    PAD: He underwent right femoral endarterectomy with Dacron Patch angioplasty and right iliofemoral thrombectomy yesterday per Dr. Trula Slade. Doing well today. He has been seen by Vascular surgery and is ready for discharge. Continue ASA and statin. Follow up is in place per Vascular surgery note.   For questions or updates, please contact [place] Please consult [email or web address] for contact info under        Signed, Lauree Chandler, MD  12/29/2020, 8:41 AM

## 2020-12-29 NOTE — Progress Notes (Signed)
OT Cancellation Note  Patient Details Name: Joshua [name] MRN: [id number] DOB: 1955/10/17   Cancelled Treatment:    Reason Eval/Treat Not Completed: OT screened, no needs identified, will sign off  Malka So 12/29/2020, 10:20 AM Nestor Lewandowsky, OTR/L Acute Rehabilitation Services Pager: [phone] Office: [phone]

## 2020-12-29 NOTE — Progress Notes (Addendum)
Progress Note    12/29/2020 7:34 AM 1 Day Post-Op  Subjective: Out of bed to chair.  No complaints.  He is ambulating without difficulty and voiding spontaneously.  Tolerating diet.   Vitals:   12/28/20 2330 12/29/20 0454  BP: (!) 148/77 (!) 144/73  Pulse: 63 61  Resp: 20 20  Temp: 97.9 F (36.6 C) 97.6 F (36.4 C)  SpO2: 96% 96%    Physical Exam: General appearance: Awake, alert in no apparent distress Cardiac: Heart rate and rhythm are regular with occasional early beat Respirations: Non-labored Incisions: Right groin incision well approximated without bleeding or hematoma Extremities: Both feet are warm with intact sensation and motor function.   Pulse/Doppler exam:  Brisk right dorsalis pedis and peroneal artery Doppler signals. Weak PT sginal   CBC    Component Value Date/Time   WBC 18.7 (H) 12/29/2020 0256   RBC 3.26 (L) 12/29/2020 0256   HGB 11.5 (L) 12/29/2020 0256   HGB 13.6 12/19/2020 1110   HGB 19.2 (H) 11/25/2016 1430   HCT 34.5 (L) 12/29/2020 0256   HCT 38.9 12/19/2020 1110   HCT 54.8 (H) 11/25/2016 1430   PLT 193 12/29/2020 0256   PLT 250 12/19/2020 1110   MCV 105.8 (H) 12/29/2020 0256   MCV 99 (H) 12/19/2020 1110   MCV 92.3 11/25/2016 1430   MCH 35.3 (H) 12/29/2020 0256   MCHC 33.3 12/29/2020 0256   RDW 12.9 12/29/2020 0256   RDW 11.8 12/19/2020 1110   RDW 15.0 (H) 11/25/2016 1430   LYMPHSABS 1.6 11/14/2020 1115   LYMPHSABS 2.5 11/25/2016 1430   MONOABS 0.5 11/14/2020 1115   MONOABS 1.3 (H) 11/25/2016 1430   EOSABS 0.1 11/14/2020 1115   EOSABS 0.3 11/25/2016 1430   BASOSABS 0.0 11/14/2020 1115   BASOSABS 0.0 11/25/2016 1430    BMET    Component Value Date/Time   NA 135 12/29/2020 0256   NA 137 12/19/2020 1110   NA 140 11/25/2016 1430   K 4.0 12/29/2020 0256   K 4.3 11/25/2016 1430   CL 101 12/29/2020 0256   CO2 27 12/29/2020 0256   CO2 27 11/25/2016 1430   GLUCOSE 120 (H) 12/29/2020 0256   GLUCOSE 101 11/25/2016 1430   BUN 13  12/29/2020 0256   BUN 9 12/19/2020 1110   BUN 12.6 11/25/2016 1430   CREATININE 0.78 12/29/2020 0256   CREATININE 0.92 07/09/2019 1435   CREATININE 0.9 11/25/2016 1430   CALCIUM 8.5 (L) 12/29/2020 0256   CALCIUM 10.3 11/25/2016 1430   GFRNONAA >60 12/29/2020 0256   GFRAA >60 07/09/2018 1610     Intake/Output Summary (Last 24 hours) at 12/29/2020 0734 Last data filed at 12/28/2020 2333 Gross per 24 hour  Intake 1028.9 ml  Output 400 ml  Net 628.9 ml    HOSPITAL MEDICATIONS Scheduled Meds:  aspirin EC  81 mg Oral Daily   atorvastatin  40 mg Oral Daily   carvedilol  12.5 mg Oral BID WC   docusate sodium  100 mg Oral Daily   DULoxetine  60 mg Oral Daily   heparin  5,000 Units Subcutaneous Q8H   influenza vac split quadrivalent PF  0.5 mL Intramuscular Tomorrow-1000   losartan  25 mg Oral Daily   mupirocin ointment  1 application Nasal BID   pantoprazole  40 mg Oral BID AC   pregabalin  100 mg Oral Daily   umeclidinium-vilanterol  1 puff Inhalation Daily   Continuous Infusions:  sodium chloride     sodium  chloride     magnesium sulfate bolus IVPB     PRN Meds:.sodium chloride, acetaminophen, albuterol, alum & mag hydroxide-simeth, bisacodyl, colchicine, guaiFENesin-dextromethorphan, hydrALAZINE, HYDROmorphone (DILAUDID) injection, labetalol, magnesium sulfate bolus IVPB, methocarbamol, metoprolol tartrate, ondansetron, oxyCODONE-acetaminophen, phenol, potassium chloride, senna-docusate, traZODone  Assessment and Plan: This is a 65 y.o. male who is s/p right femoral endarterectomy with Dacron Patch angioplasty and right iliofemoral thrombectomy by Dr. Trula Slade due to RLE rest pain. VSS.  Afebrile.  Pain controlled.  Right groin incision well approximated.  Right lower extremity well-perfused.  Stable for discharge home. Continue aspirin and statin.   -DVT prophylaxis: Heparin subcutaneously   Risa Grill, PA-C [facility] [phone] 12/29/2020  7:34  AM   I have seen and evaluated the patient. I agree with the PA note as documented above.  Postop day 1 status post right femoral endarterectomy with profundoplasty by Dr. Trula Slade.  Right groin looks good.  He has good Doppler flow in the right foot.  He wants to be discharged today and that seems appropriate.  Discussed aspirin statin from my standpoint.  We will arrange follow-up in 2 to 3 weeks for wound check.  Discussed he call with questions or concerns.  Marty Heck, MD [facility] [place] [employer] Office: [phone]

## 2020-12-29 NOTE — Progress Notes (Signed)
PHARMACIST LIPID MONITORING   Joshua [name] is a 65 y.o. male admitted on 12/27/2020 with PVD.  Pharmacy has been consulted to optimize lipid-lowering therapy with the indication of secondary prevention for clinical ASCVD.  Recent Labs:  Lipid Panel (last 6 months):   Lab Results  Component Value Date   CHOL 203 (H) 12/29/2020   TRIG 103 12/29/2020   HDL 50 12/29/2020   CHOLHDL 4.1 12/29/2020   VLDL 21 12/29/2020   LDLCALC 132 (H) 12/29/2020    Hepatic function panel (last 6 months):   Lab Results  Component Value Date   AST 49 (H) 11/21/2020   ALT 39 11/21/2020   ALKPHOS 134 (H) 11/21/2020   BILITOT 0.8 11/21/2020    SCr (since admission):   Serum creatinine: 0.78 mg/dL 12/29/20 0256 Estimated creatinine clearance: 99.6 mL/min  Current therapy and lipid therapy tolerance Current lipid-lowering therapy: atorvastatin 40mg /d Previous lipid-lowering therapies (if applicable): none Documented or reported allergies or intolerances to lipid-lowering therapies (if applicable): none  Assessment:   Patient agrees with changes to lipid-lowering therapy  Plan:    1.Statin intensity (high intensity recommended for all patients regardless of the LDL):  Add or increase statin to high intensity.  2.Add ezetimibe (if any one of the following):   Cannot tolerate statin at any dose. On a high intensity statin with LDL > 70.  3.Refer to lipid clinic:   No -Consider lipid clinic referral after checking lipid panel in 8-12 weeks  4.Follow-up with:  Cardiology provider - Candee Furbish, MD  5.Follow-up labs after discharge:  Changes in lipid therapy were made. Check a lipid panel in 8-12 weeks then annually.     Hildred Laser, PharmD Clinical Pharmacist **Pharmacist phone directory can now be found on [email or web address] ([email or web address]).  Listed under [place].

## 2020-12-29 NOTE — Evaluation (Signed)
Physical Therapy Evaluation Patient Details Name: Joshua [name] MRN: [id number] DOB: 1955/06/23 Today's Date: 12/29/2020  History of Present Illness  The pt is a 65 yo male presenting 10/12 for angiography and is now s/p R femoral endarterectomy on 10/13. PMH includes: CAD, chronic back pain, COPD, DM II, HLD, HTN, PAD, and aortobifemoral bypass graft.   Clinical Impression  Pt in bed upon arrival of PT, agreeable to evaluation at this time. Prior to admission the pt was completely independent without need for DME, living with wife who works part time. The pt now presents with minor limitations in functional mobility and activity tolerance due to above dx and resulting pain in R groin, but is safe to return home without need for assist or further DME. He was able to demo good stability with all transfers and ambulation without need for assist or UE support, and was agreeable to all education regarding progressive mobility and ambulation at home. All education completed and pt will have necessary level of assist needed at home. No further acute PT needs at this time.      Recommendations for follow up therapy are one component of a multi-disciplinary discharge planning process, led by the attending physician.  Recommendations may be updated based on patient status, additional functional criteria and insurance authorization.  Follow Up Recommendations No PT follow up;Supervision for mobility/OOB    Equipment Recommendations  None recommended by PT    Recommendations for Other Services       Precautions / Restrictions Precautions Precautions: None Restrictions Weight Bearing Restrictions: No      Mobility  Bed Mobility Overal bed mobility: Independent             General bed mobility comments: pt OOB in chair upon arrival    Transfers Overall transfer level: Needs assistance Equipment used: None Transfers: Sit to/from Stand Sit to Stand: Supervision          General transfer comment: supervision during session for safety, no evidence of instability without UE support  Ambulation/Gait Ambulation/Gait assistance: Supervision Gait Distance (Feet): 400 Feet Assistive device: None Gait Pattern/deviations: Decreased step length - right Gait velocity: 0.33 m/s Gait velocity interpretation: <1.31 ft/sec, indicative of household ambulator General Gait Details: pt with mildly antalgic gait due to pain in RLE, good stability without UE support      Balance Overall balance assessment: Mild deficits observed, not formally tested                                           Pertinent Vitals/Pain Pain Assessment: Faces Faces Pain Scale: Hurts even more Pain Location: R groin Pain Descriptors / Indicators: Burning;Sharp Pain Intervention(s): Limited activity within patient's tolerance;Monitored during session;Repositioned    Home Living Family/patient expects to be discharged to:: Private residence Living Arrangements: Spouse/significant other Available Help at Discharge: Family;Available PRN/intermittently Type of Home: House Home Access: Ramped entrance     Home Layout: One level Home Equipment: Cane - single point;Walker - 2 wheels;Wheelchair - manual Additional Comments: pt reports intermittent use of cane, no other need for DME    Prior Function Level of Independence: Independent         Comments: independent with intermittent use of cane, wife works 4 hours/day (off at noon) but he is stable at home without assist     Hand Dominance   Dominant Hand: Right  Extremity/Trunk Assessment   Upper Extremity Assessment Upper Extremity Assessment: Overall WFL for tasks assessed    Lower Extremity Assessment Lower Extremity Assessment: RLE deficits/detail RLE Deficits / Details: grossly 4/5. slight limitation by pain at groin. pt also reporting decreased sensation below R ankle RLE Sensation: decreased light  touch RLE Coordination: WNL    Cervical / Trunk Assessment Cervical / Trunk Assessment: Normal  Communication   Communication: No difficulties  Cognition Arousal/Alertness: Awake/alert Behavior During Therapy: WFL for tasks assessed/performed Overall Cognitive Status: Within Functional Limits for tasks assessed                                        General Comments General comments (skin integrity, edema, etc.): VSS on RA        Assessment/Plan    PT Assessment Patent does not need any further PT services         PT Goals (Current goals can be found in the Care Plan section)  Acute Rehab PT Goals Patient Stated Goal: return home PT Goal Formulation: With patient Time For Goal Achievement: 01/12/21 Potential to Achieve Goals: Good     AM-PAC PT "6 Clicks" Mobility  Outcome Measure Help needed turning from your back to your side while in a flat bed without using bedrails?: None Help needed moving from lying on your back to sitting on the side of a flat bed without using bedrails?: None Help needed moving to and from a bed to a chair (including a wheelchair)?: None Help needed standing up from a chair using your arms (e.g., wheelchair or bedside chair)?: None Help needed to walk in hospital room?: A Little Help needed climbing 3-5 steps with a railing? : A Little 6 Click Score: 22    End of Session Equipment Utilized During Treatment: Gait belt Activity Tolerance: Patient tolerated treatment well Patient left: in chair;with call bell/phone within reach Nurse Communication: Mobility status PT Visit Diagnosis: Other abnormalities of gait and mobility (R26.89);Pain Pain - Right/Left: Right Pain - part of body: Leg    Time: 1002-1017 PT Time Calculation (min) (ACUTE ONLY): 15 min   Charges:   PT Evaluation $PT Eval Low Complexity: 1 Low          West Carbo, PT, DPT   Acute Rehabilitation Department Pager #: [phone]  Sandra Cockayne 12/29/2020, 11:18 AM

## 2020-12-29 NOTE — Progress Notes (Signed)
Vital signs stable  Patients belongings returned  Patient reviewed discharge instructions  Patient discharged home with wife  IV removed  Taken off Tele

## 2020-12-29 NOTE — Discharge Summary (Addendum)
Discharge Summary    Patient ID: Joshua [name] MRN: [id number]; DOB: 06-29-1955  Admit date: 12/27/2020 Discharge date: 12/29/2020  PCP:  Charlott Rakes, MD   [facility] Providers Cardiologist:  Candee Furbish, MD   {  Discharge Diagnoses    Principal Problem:   Critical lower limb ischemia [facility]) Active Problems:   Essential hypertension   Moderate COPD (chronic obstructive pulmonary disease) ([place])   Type 2 diabetes mellitus with peripheral neuropathy ([place])   CAD in native artery, occluded Lcx and rotational atherectomy to LAD with DES 05/17/15   PAD (peripheral artery disease) ([facility])   HLD (hyperlipidemia)   Paroxysmal atrial fibrillation ([place])    Diagnostic Studies/Procedures    Peripheral Angiogram 12/27/2020: 1. Patent aortobifemoral bypass. 2. Right lower extremity: Significant stenosis in the common femoral artery at the outflow of the aortofemoral bypass, severe calcified ostial profundus stenosis with flush occlusion in the SFA with reconstitution in the distal segment with moderate focal calcified stenosis of the popliteal artery and two-vessel runoff below the knee. 3.  Left lower extremity: Medium size pseudoaneurysmal area in the common femoral area at the outflow of the anastomosis of aortofemoral bypass with moderate calcified disease extending into the ostium of the profunda and severe calcified stenosis in the ostial SFA with significant calcified stenosis in the popliteal artery and three-vessel runoff below the knee.   Recommendations: The patient has rest pain on the right side and severe claudication on the left side.  No ulceration. Recommend right common femoral artery endarterectomy with profundoplasty plus minus femoral to above-the-knee popliteal bypass. On the left side, the patient might also require common femoral artery endarterectomy into the ostium of the SFA.  Endovascular intervention on the left side might be possible but high  risk. _____________   History of Present Illness     Joshua [name] is a 65 y.o. male with a history of CAD s/p prior DES to mid LAD, paroxysmal atrial fibrillation diagnosed in 10/2020 not started on anticoagulation given GI bleed and heavy alcohol use, PAD s/p aortobifemoral bypass in 2010 at [place] and orbital atherectomy and drug-coated balloon angioplasty to the right SFA in 2019, COPD, hypertension, hyperlipidemia, type 2 diabetes mellitus with neuropathy, GERD, tobacco abuse, and heavy alcohol use.   Patient was recently seen by Dr. Fletcher [name] on 12/19/2020 for urgent evaluation of severe pain in right leg after not being seen since 2019. Patient reported severe pain affecting his right lower extremity from the hip all the way down. He stated pain was at rest with continuous cramping in both legs but worse on the right. He had no lower extremity ulceration. Both feet were very cold to touch on exam. At that visit, he reported he continued to smoke but stated that he had quit drinking alcohol since his last hospitalization in 10/2020.  Outpatient peripheral angiogram was scheduled for further evaluation.  Hospital Course     Consultants: Vascular Surgery  Patient presented to [facility] on 12/27/2020 for planned peripheral angiogram as above. Peripheral angiogram showed patent aortobifemoral bypass with significant stenosis in the common femoral artery at the outflow of the aortofemoral bypass, severe calcified ostial profundus stenosis with flush occlusion in the SFA with reconstitution in the distal segment with moderate focal calcified stenosis of the popliteal artery and two-vessel runoff below the knee as well as a medium size pseudoaneurysmal area in the common femoral area at the outflow of the anastomosis of the aortofemoral bypass with moderate calcified disease extending into  the ostium of the profunda and severe calcified stenosis in the ostial SFA with significant calcified stenosis in the  popliteal artery and three vessel runoff below the knee. Patient was admitted and Vascular Surgery was consulted. Patient underwent right femoral endarterectomy with Dacron patchy angioplasty and right iliofemoral thrombectomy with Dr. Trula Slade on 12/28/2020. Patient tolerated the procedure well and was felt to be stable for discharge today from a Vascular Surgery standpoint. Vascular Surgery recommended Aspirin 22m daily. Lipitor was increased to 875mdaily and he was added on Zetia 1073maily given LDL of 132 this admission. Will need repeat lipid panel and LFTs in 2-3 months. Vascular Surgery also prescribed PRN Percocet for pain control. Vascular Surgery will arrange follow-up in their office. Patient also has a follow-up visit with Dr. AriFletcher Anonheduled for 01/30/2021.   Of note, patient maintained sinus rhythm throughout admission.   Did the patient have an acute coronary syndrome (MI, NSTEMI, STEMI, etc) this admission?:  No                               Did the patient have a percutaneous coronary intervention (stent / angioplasty)?:  No.   _____________  Discharge Vitals Blood pressure 132/81, pulse 63, temperature 98.2 F (36.8 C), temperature source Oral, resp. rate 20, height 5' 8.5" (1.74 m), weight 84.4 kg, SpO2 99 %.  Filed Weights   12/27/20 0642  Weight: 84.4 kg    Labs & Radiologic Studies    CBC Recent Labs    12/28/20 1105 12/29/20 0256  WBC 14.6* 18.7*  HGB 12.0* 11.5*  HCT 36.2* 34.5*  MCV 105.2* 105.8*  PLT 193 [id number] Metabolic Panel Recent Labs    12/28/20 1105 12/29/20 0256  NA  --  135  K  --  4.0  CL  --  101  CO2  --  27  GLUCOSE  --  120*  BUN  --  13  CREATININE 0.91 0.78  CALCIUM  --  8.5*   Liver Function Tests No results for input(s): AST, ALT, ALKPHOS, BILITOT, PROT, ALBUMIN in the last 72 hours. No results for input(s): LIPASE, AMYLASE in the last 72 hours. High Sensitivity Troponin:   No results for input(s): TROPONINIHS in the  last 720 hours.  BNP Invalid input(s): POCBNP D-Dimer No results for input(s): DDIMER in the last 72 hours. Hemoglobin A1C Recent Labs    12/29/20 0256  HGBA1C 6.0*   Fasting Lipid Panel Recent Labs    12/29/20 0256  CHOL 203*  HDL 50  LDLCALC 132*  TRIG 103  CHOLHDL 4.1   Thyroid Function Tests No results for input(s): TSH, T4TOTAL, T3FREE, THYROIDAB in the last 72 hours.  Invalid input(s): FREET3 _____________  PERIPHERAL VASCULAR CATHETERIZATION  Result Date: 12/27/2020 1. Patent aortobifemoral bypass. 2. Right lower extremity: Significant stenosis in the common femoral artery at the outflow of the aortofemoral bypass, severe calcified ostial profundus stenosis with flush occlusion in the SFA with reconstitution in the distal segment with moderate focal calcified stenosis of the popliteal artery and two-vessel runoff below the knee. 3.  Left lower extremity: Medium size pseudoaneurysmal area in the common femoral area at the outflow of the anastomosis of aortofemoral bypass with moderate calcified disease extending into the ostium of the profunda and severe calcified stenosis in the ostial SFA with significant calcified stenosis in the popliteal artery and three-vessel runoff below the knee. Recommendations: The patient  has rest pain on the right side and severe claudication on the left side.  No ulceration. Recommend right common femoral artery endarterectomy with profundoplasty plus minus femoral to above-the-knee popliteal bypass. On the left side, the patient might also require common femoral artery endarterectomy into the ostium of the SFA.  Endovascular intervention on the left side might be possible but high risk.   VAS [place] ABI WITH/WO TBI  Result Date: 12/24/2020  LOWER EXTREMITY DOPPLER STUDY Patient Name:  DEMIAN MAISEL  Date of Exam:   12/22/2020 Medical Rec #: [id number]           Accession #:    [id number] Date of Birth: 11/02/1955           Patient Gender: M Patient  Age:   53 years Exam Location:  [facility] Procedure:      VAS [place] ABI WITH/WO TBI Referring Phys: [facility] ARIDA --------------------------------------------------------------------------------  Indications: Peripheral artery disease, and h/o multiple interventions. Patient              presents today with complaints of bilateral lower extremity pain              when walking a short distance starting at the hips, right worse              than left. He also c/o pain and coldness to both feet, right worse              than left. Reddish discoloration is noted to the right foot. This              has been going on for at least one month and has worsened. High Risk Factors: Hypertension, hyperlipidemia, Diabetes, current smoker,                    coronary artery disease. Other Factors: COPD.  Vascular Interventions: Successful orbital atherectomy and drug-coated balloon                         angioplasty to the ostial and proximal right SFA on                         01/07/2018. Aortobifemoral bypass in 2010 at [place]. Comparison Study: In 01/2018, a lower arterial Doppler showed an ABI of 1.06 on                   the right and 1.17 on the left. Performing Technologist: Sharlett Iles RVT  Examination Guidelines: A complete evaluation includes at minimum, Doppler waveform signals and systolic blood pressure reading at the level of bilateral brachial, anterior tibial, and posterior tibial arteries, when vessel segments are accessible. Bilateral testing is considered an integral part of a complete examination. Photoelectric Plethysmograph (PPG) waveforms and toe systolic pressure readings are included as required and additional duplex testing as needed. Limited examinations for reoccurring indications may be performed as noted.  ABI Findings: +---------+------------------+-----+-------------------+---------+ Right    Rt Pressure (mmHg)IndexWaveform           Comment    +---------+------------------+-----+-------------------+---------+ Brachial 165                                                 +---------+------------------+-----+-------------------+---------+ PTA      41  0.25 dampened monophasic          +---------+------------------+-----+-------------------+---------+ PERO                            absent             inaudible +---------+------------------+-----+-------------------+---------+ DP       56                0.34 dampened monophasic          +---------+------------------+-----+-------------------+---------+ Great Toe0                 0.00 Absent                       +---------+------------------+-----+-------------------+---------+ +---------+------------------+-----+----------------------+-------+ Left     Lt Pressure (mmHg)IndexWaveform              Comment +---------+------------------+-----+----------------------+-------+ Brachial 163                                                  +---------+------------------+-----+----------------------+-------+ PTA      171               1.04 biphasic to monophasic        +---------+------------------+-----+----------------------+-------+ PERO     130                    monophasic            .79     +---------+------------------+-----+----------------------+-------+ DP       146               0.88 monophasic                    +---------+------------------+-----+----------------------+-------+ Great Toe90                0.55 Abnormal                      +---------+------------------+-----+----------------------+-------+ +-------+-----------+-----------+------------+------------+ ABI/TBIToday's ABIToday's TBIPrevious ABIPrevious TBI +-------+-----------+-----------+------------+------------+ Right  .34        0.0        1.06        .78          +-------+-----------+-----------+------------+------------+ Left   1.04       .55         1.17        .89          +-------+-----------+-----------+------------+------------+  Arterial wall calcification precludes accurate ankle pressures and ABIs on the leftt. Right ABIs and bilateral TBIs appear decreased compared to prior study on 01/21/2018. Left ABIs appear essentially unchanged compared to prior study on 01/21/2018.  Summary: Right: Resting right ankle-brachial index indicates severe right lower extremity arterial disease. The right toe-brachial index is abnormal. Left: The left toe-brachial index is abnormal. Although ankle brachial indices are within normal limits (0.95-1.29), arterial Doppler waveforms at the ankle suggest some component of arterial occlusive disease.  *See table(s) above for measurements and observations. See Aortoiliac and LE Arterial duplex reports.  Vascular consult recommended. Scheduled for aortogram on December 27, 2020. Electronically signed by Carlyle Dolly MD on 12/24/2020 at 8:54:33 AM.    Final    VAS [place] LOWER EXTREMITY ARTERIAL DUPLEX  Result Date: 12/24/2020 LOWER EXTREMITY ARTERIAL DUPLEX STUDY Patient  Name:  KAEO JACOME  Date of Exam:   12/22/2020 Medical Rec #: [id number]           Accession #:    [id number] Date of Birth: [birth year]           Patient Gender: M Patient Age:   13 years Exam Location:  [facility] Procedure:      VAS [place] LOWER EXTREMITY ARTERIAL DUPLEX Referring Phys: [facility] ARIDA --------------------------------------------------------------------------------  Indications: Peripheral artery disease, and h/o multiple interventions. Patient              presents today with complaints of bilateral lower extremity pain              when walking a short distance starting at the hips, right worse              than left. He also c/o pain and coldness to both feet, right worse              than left. Reddish discoloration is noted to the right foot. This              has been going on for at least one month and has worsened. High Risk  Factors: Hypertension, hyperlipidemia, Diabetes, current smoker,                    coronary artery disease. Other Factors: COPD.  Vascular Interventions: Successful orbital atherectomy and drug-coated balloon                         angioplasty to the ostial and proximal right SFA on                         01/07/2018. Aortobifemoral bypass in 2010 at [place]. Current ABI:            .34 on the right and 1.04 on the left Performing Technologist: Sharlett Iles RVT  Examination Guidelines: A complete evaluation includes B-mode imaging, spectral Doppler, color Doppler, and power Doppler as needed of all accessible portions of each vessel. Bilateral testing is considered an integral part of a complete examination. Limited examinations for reoccurring indications may be performed as noted. Aorta: +--------+-------+----------+----------+--------+--------+-----+         AP (cm)Trans (cm)PSV (cm/s)WaveformThrombusShape +--------+-------+----------+----------+--------+--------+-----+ Proximal                 60                              +--------+-------+----------+----------+--------+--------+-----+ Mid                      55                              +--------+-------+----------+----------+--------+--------+-----+ Distal                   39                              +--------+-------+----------+----------+--------+--------+-----+   +-----------+--------+-----+--------+----------+-------------------------------+ RIGHT      PSV cm/sRatioStenosisWaveform  Comments                        +-----------+--------+-----+--------+----------+-------------------------------+ CFA Distal 46  monophasicdistal CFA/proixmal SFA         +-----------+--------+-----+--------+----------+-------------------------------+ DFA        564                  monophasic                                 +-----------+--------+-----+--------+----------+-------------------------------+ SFA Prox   9                    monophasic                                +-----------+--------+-----+--------+----------+-------------------------------+ SFA Mid    0            occluded          immediate reconstitution of                                               flow in the mid segment         +-----------+--------+-----+--------+----------+-------------------------------+ SFA Distal 23                   monophasic                                +-----------+--------+-----+--------+----------+-------------------------------+ POP Prox   35                   monophasic                                +-----------+--------+-----+--------+----------+-------------------------------+ POP Mid    25                   monophasic                                +-----------+--------+-----+--------+----------+-------------------------------+ POP Distal 28                   monophasic                                +-----------+--------+-----+--------+----------+-------------------------------+ TP Trunk   31                   monophasic                                +-----------+--------+-----+--------+----------+-------------------------------+ ATA Prox   18                   monophasic                                +-----------+--------+-----+--------+----------+-------------------------------+ ATA Mid    31                   monophasic                                +-----------+--------+-----+--------+----------+-------------------------------+  ATA Distal 22                   monophasic                                +-----------+--------+-----+--------+----------+-------------------------------+ PTA Prox   20                   monophasic                                +-----------+--------+-----+--------+----------+-------------------------------+ PTA  Mid    30                   monophasic                                +-----------+--------+-----+--------+----------+-------------------------------+ PTA Distal 10                   monophasic                                +-----------+--------+-----+--------+----------+-------------------------------+ PERO Prox  28                   monophasic                                +-----------+--------+-----+--------+----------+-------------------------------+ PERO Mid   30                   monophasic                                +-----------+--------+-----+--------+----------+-------------------------------+ PERO Distal9                    monophasic                                +-----------+--------+-----+--------+----------+-------------------------------+  +-----------+--------+-----+---------------+-----------+-----------------------+ LEFT       PSV cm/sRatioStenosis       Waveform   Comments                +-----------+--------+-----+---------------+-----------+-----------------------+ CFA Distal 305          50-74% stenosismultiphasicdistal CFA/proixmal SFA +-----------+--------+-----+---------------+-----------+-----------------------+ DFA        223                         multiphasicturbulent               +-----------+--------+-----+---------------+-----------+-----------------------+ SFA Prox   262          50-74% stenosismultiphasicdistal to ostium        +-----------+--------+-----+---------------+-----------+-----------------------+ SFA Mid    43                          biphasic   dampened                +-----------+--------+-----+---------------+-----------+-----------------------+ SFA Distal 43                          biphasic   dampened                +-----------+--------+-----+---------------+-----------+-----------------------+  POP Prox   48                          biphasic   dampened                 +-----------+--------+-----+---------------+-----------+-----------------------+ POP Mid    26                          biphasic   dampened                +-----------+--------+-----+---------------+-----------+-----------------------+ POP Distal 31                          biphasic   dampened                +-----------+--------+-----+---------------+-----------+-----------------------+ TP Trunk   27                          biphasic   dampened                +-----------+--------+-----+---------------+-----------+-----------------------+ ATA Prox   40                          biphasic   dampened                +-----------+--------+-----+---------------+-----------+-----------------------+ ATA Mid    22                          biphasic   dampened                +-----------+--------+-----+---------------+-----------+-----------------------+ ATA Distal 21                          biphasic   dampened                +-----------+--------+-----+---------------+-----------+-----------------------+ PTA Prox   27                          biphasic   dampened                +-----------+--------+-----+---------------+-----------+-----------------------+ PTA Mid    19                          biphasic   dampened                +-----------+--------+-----+---------------+-----------+-----------------------+ PTA Distal 28                          biphasic   dampened                +-----------+--------+-----+---------------+-----------+-----------------------+ PERO Prox  27                          biphasic   dampened                +-----------+--------+-----+---------------+-----------+-----------------------+ PERO Mid   103     5.4  75-99% stenosismonophasic dampened, stenosis  based on velocity ratio +-----------+--------+-----+---------------+-----------+-----------------------+ PERO  Distal10                          monophasic dampened                +-----------+--------+-----+---------------+-----------+-----------------------+ A focal velocity elevation of 103 cm/s was obtained at mid peroneal with a VR of 5.4. Findings are characteristic of 75-99% stenosis.  Summary: Right: Severe progression is noted compared to previous study. Extensive calcified plaque throughout. Occluded mid SFA with immediate reconstitution of flow in the mid segment. Medial calcifications of the tibial vessel; three vessel run-off. Left: Severe progression is noted compared to previous study. Extensive calcified plaque throughout. 50-70% restenosis at the outflow of the aortobifemoral bypass graft of the distal CFA/proximal SFA. 50-74% stenosis in the proximal SFA distal to ostium. Medial calcifications of the tibial vessel; three vessel run-off with probable 75-99% stenosis in the mid peroneal artery, based on velocity ratio.  Failed attempt for pedal acceleration time on the right. Unable to determine any flow.  See table(s) above for measurements and observations. See Aortoiliac duplex and ABI reports. Vascular consult recommended. Scheduled for an aortogram on December 27, 2020. Electronically signed by Carlyle Dolly MD on 12/24/2020 at 9:13:54 AM.    Final    VAS US AORTA/IVC/ILIACS  Result Date: 12/24/2020 ABDOMINAL AORTA STUDY Patient Name:  ZAKRY CASO Hachey  Date of Exam:   12/22/2020 Medical Rec #: [id number]           Accession #:    [id number] Date of Birth: 1955/09/05           Patient Gender: M Patient Age:   49 years Exam Location:  [facility] Procedure:      VAS US AORTA/IVC/ILIACS Referring Phys: [facility] ARIDA --------------------------------------------------------------------------------  Indications: Peripheral arterial disease, and h/o multiple interventions.              Patient presents today with complaints of bilateral lower extremity              pain when walking a short distance  starting at the hips, right              worse than left. He also c/o pain and coldness to both feet, right              worse than left. Reddish discoloration is noted to the right foot.              This has been going on for at least one month and has worsened.               Today's ABIs are .34 on the right and 1.04 on the left (abnormal              waveforms). Risk Factors: Hypertension, hyperlipidemia, Diabetes, current smoker, coronary               artery disease. Other Factors: COPD. Vascular Interventions: Successful orbital atherectomy and drug-coated balloon                         angioplasty to the ostial and proximal right SFA on                         01/07/2018. Aortobifemoral bypass in 2010 at [place]. Limitations: Air/bowel gas.  Comparison Study: In 11/2017, an aortoiliac duplex showed patent aortobifemoral  bypass graft without evidence of stenosis. Elevated velocities                   of 366 cm/s at the right proximal SFA runoff. Elevated                   velocities of 218 cm/s at the left CFA/SFA origin and                   velocities of 454 cm/s in the left DFA. Performing Technologist: [facility]  Examination Guidelines: A complete evaluation includes B-mode imaging, spectral Doppler, color Doppler, and power Doppler as needed of all accessible portions of each vessel. Bilateral testing is considered an integral part of a complete examination. Limited examinations for reoccurring indications may be performed as noted.  Abdominal Aorta Findings: +--------+-------+----------+----------+----------+--------+--------+ LocationAP (cm)Trans (cm)PSV (cm/s)Waveform  ThrombusComments +--------+-------+----------+----------+----------+--------+--------+ Proximal2.20   2.20      60        monophasic                 +--------+-------+----------+----------+----------+--------+--------+ Mid     2.20   2.20      55        monophasic                  +--------+-------+----------+----------+----------+--------+--------+ Distal  2.30   2.30      39        monophasic                 +--------+-------+----------+----------+----------+--------+--------+ IVC/Iliac Findings: +--------+------+--------+--------+   IVC   PatentThrombusComments +--------+------+--------+--------+ IVC Proxpatent                 +--------+------+--------+--------+   Right Graft #1: +------------------+--------+---------------+----------+-----------------------+ Aortobifemoral    PSV cm/sStenosis       Waveform  Comments                +------------------+--------+---------------+----------+-----------------------+ Inflow            26                     monophasic                        +------------------+--------+---------------+----------+-----------------------+ Prox Anastomosis  27                     monophasic                        +------------------+--------+---------------+----------+-----------------------+ Proximal Graft    23                     monophasic                        +------------------+--------+---------------+----------+-----------------------+ Mid Graft         43                     monophasichomogeneous plaque                                                         noted                   +------------------+--------+---------------+----------+-----------------------+ Distal  Graft      95      50-70% stenosismonophasichomogeneous plaque                                                         noted, 2.2 velocity                                                        ratio                   +------------------+--------+---------------+----------+-----------------------+ Distal Anastomosis72                     monophasic                        +------------------+--------+---------------+----------+-----------------------+ Outflow           153     50-74%  stenosismonophasiccalcified plaque at                                                        CFA/SFA, 2.1 velocity                                                      ratio                   +------------------+--------+---------------+----------+-----------------------+ Patent right aortobifemoral bypass graft with mildly elevated velocities at the distal graft and CFA/SFA outflow. Left Graft #1: +--------------------+-------+---------------+----------+----------------------+                     PSV    Stenosis       Waveform  Comments                                   cm/s                                                   +--------------------+-------+---------------+----------+----------------------+ Inflow              27                    monophasic                       +--------------------+-------+---------------+----------+----------------------+ Proximal Anastomosis45                    monophasic                       +--------------------+-------+---------------+----------+----------------------+ Proximal Graft      49  monophasic                       +--------------------+-------+---------------+----------+----------------------+ Mid Graft           42                    monophasic                       +--------------------+-------+---------------+----------+----------------------+ Distal Graft        48                    monophasic                       +--------------------+-------+---------------+----------+----------------------+ Distal Anastomosis  56                    monophasic                       +--------------------+-------+---------------+----------+----------------------+ Outflow             312    75-99% stenosismonophasic5.6 velocity ratio,                                                        CFA                     +--------------------+-------+---------------+----------+----------------------+ Patent left aortobifemoral bypass graft with elevated velocities at the CFA outflow  Summary: Abdominal Aorta: Mildly abnormal tapering of the abdominal aorta, with largest diameter at the distal segment, measuring 2.3 cm. Stenosis: Patent right aortobifemoral bypass graft with mildly elevated velocities at the distal graft and CFA/SFA outflow, both suggesting 50-70% restenosis. Mild dilatation at the right distal anastomosis/CFA at 1.6 cm. Patent left aortobifemoral bypass graft with elevated velocities at the CFA outflow suggesting >70% restenosis. Abnormal dilatation at the left distal anastomosis/CFA at 2.3 cm. IVC/Iliac: Patent IVC.  *See table(s) above for measurements and observations. See LE Arterial duplex and ABI reports. Vascular consult recommended. Schedule for an aortogram on December 27, 2020. Electronically signed by Carlyle Dolly MD on 12/24/2020 at 9:02:45 AM.    Final    Disposition   Patient is being discharged home today in good condition.  Follow-up Plans & Appointments     Follow-up Information     Serafina Mitchell, MD Follow up.   Specialties: Vascular Surgery, Cardiology Why: office will call you to arrange your appt (sent) Contact information: [place] [address] [phone]                Discharge Instructions     Discharge patient   Complete by: As directed    Discharge disposition: 01-Home or Self Care   Discharge patient date: 12/29/2020       Discharge Medications   Allergies as of 12/29/2020       Reactions   Lisinopril Hives, Itching   Amitriptyline Other (See Comments)   Makes pt feel "weird" and nausea.   Contrast Media [iodinated Diagnostic Agents] Hives, Itching   Pt can use if taking benadryl    Ibuprofen Other (See Comments)   Internal bleeding        Medication List     STOP taking these  medications    clopidogrel 75 MG  tablet Commonly known as: PLAVIX   hydrocortisone 25 MG suppository Commonly known as: ANUSOL-HC       TAKE these medications    acetaminophen 500 MG tablet Commonly known as: TYLENOL Take 1,000 mg by mouth every 6 (six) hours as needed for mild pain.   albuterol (2.5 MG/3ML) 0.083% nebulizer solution Commonly known as: PROVENTIL Take 3 mLs (2.5 mg total) by nebulization every 6 (six) hours as needed for wheezing or shortness of breath. What changed: Another medication with the same name was changed. Make sure you understand how and when to take each.   albuterol 108 (90 Base) MCG/ACT inhaler Commonly known as: ProAir HFA USE 2 PUFFS EVERY 6 HOURS  AS NEEDED FOR WHEEZING OR  SHORTNESS OF BREATH What changed:  how much to take how to take this when to take this reasons to take this additional instructions   aspirin EC 81 MG tablet Take 81 mg by mouth daily.   atorvastatin 80 MG tablet Commonly known as: Lipitor Take 1 tablet (80 mg total) by mouth daily. What changed:  medication strength how much to take   carvedilol 12.5 MG tablet Commonly known as: COREG Take 1 tablet (12.5 mg total) by mouth 2 (two) times daily with a meal.   colchicine 0.6 MG tablet TAKE 2 TABLETS BY MOUTH AS  DIRECTED THEN 1 TABLET  AFTER 1 HOUR. CAN TAKE 1  TABLET TWICE DAILY  THEREAFTER. What changed:  how much to take how to take this when to take this reasons to take this additional instructions   DULoxetine 60 MG capsule Commonly known as: Cymbalta Take 1 capsule (60 mg total) by mouth daily. For Neuropathy   ezetimibe 10 MG tablet Commonly known as: Zetia Take 1 tablet (10 mg total) by mouth daily.   fexofenadine 180 MG tablet Commonly known as: Allegra Allergy Take 1 tablet (180 mg total) by mouth daily.   JOINTFLEX EX Apply 1 application topically daily as needed (pain).   losartan 25 MG tablet Commonly known as: COZAAR TAKE 1 TABLET BY MOUTH  DAILY   methocarbamol  500 MG tablet Commonly known as: ROBAXIN Take 500 mg by mouth every 6 (six) hours as needed for muscle spasms.   ONE TOUCH LANCETS Misc 1 each by Does not apply route 3 (three) times daily.   OneTouch Ultra test strip Generic drug: glucose blood Use as instructed   OneTouch Verio w/Device Kit 1 Device by Does not apply route once.   OVER THE COUNTER MEDICATION Apply 1 application topically daily as needed (neuropathy pain). Hemp oil cream   OVER THE COUNTER MEDICATION Apply 1 application topically daily as needed (pain). Hempvana cream   oxyCODONE-acetaminophen 5-325 MG tablet Commonly known as: PERCOCET/ROXICET Take 1 tablet by mouth every 4 (four) hours as needed for moderate pain.   pantoprazole 40 MG tablet Commonly known as: PROTONIX Take 1 tablet (40 mg total) by mouth 2 (two) times daily before a meal.   predniSONE 50 MG tablet Commonly known as: DELTASONE Take one tablet by mouth 12/26/20 at 7:30 PM, one tablet 12/27/20 at 1:30 AM, one tablet AND benadryl 50 mg 12/27/20 just prior to leaving home for hospital   pregabalin 100 MG capsule Commonly known as: LYRICA Take 1 capsule (100 mg total) by mouth daily.   traZODone 100 MG tablet Commonly known as: DESYREL TAKE 1 TABLET BY MOUTH AT  BEDTIME What changed:  when to take this  reasons to take this   Trelegy Ellipta 100-62.5-25 MCG/INH Aepb Generic drug: Fluticasone-Umeclidin-Vilant Inhale 1 puff into the lungs daily.           Outstanding Labs/Studies   Repeat lipid panel and LFTs in 2-3 months.  Duration of Discharge Encounter   Greater than 30 minutes including physician time.  Signed, Darreld Mclean, PA-C 12/29/2020, 3:08 PM  I have personally seen and examined this patient. I agree with the assessment and plan as outlined above.  See my full note this am.   Lauree Chandler 12/29/2020 3:56 PM

## 2021-01-01 ENCOUNTER — Telehealth: Payer: Self-pay | Admitting: *Deleted

## 2021-01-01 ENCOUNTER — Other Ambulatory Visit: Payer: Self-pay | Admitting: Physician Assistant

## 2021-01-01 MED ORDER — TRAMADOL HCL 50 MG PO TABS: 100.0000 mg | ORAL_TABLET | Freq: Four times a day (QID) | ORAL | 0 refills | Status: DC | PRN

## 2021-01-01 NOTE — Telephone Encounter (Signed)
Pt called to report swelling from groin to foot after right fem endart on 12/28/20. Right leg is painful and red from the knee to the groin. Pt is also requesting more pain medicine. Foot is warm and foor pain is improved from before surgery. Pt doesn't think he has a fever but is unable to check. Discussed pt with Katharine Look PA. She is going to send him in some tramadol. Pt is to call us back if redness does not improve in couple days. Pt voiced understanding.

## 2021-01-03 ENCOUNTER — Telehealth: Payer: Self-pay

## 2021-01-03 ENCOUNTER — Other Ambulatory Visit: Payer: Self-pay

## 2021-01-03 ENCOUNTER — Ambulatory Visit (INDEPENDENT_AMBULATORY_CARE_PROVIDER_SITE_OTHER)
Admission: RE | Admit: 2021-01-03 | Discharge: 2021-01-03 | Disposition: A | Discharge status: Home / Self Care | Payer: Medicare Other | Source: Ambulatory Visit | Attending: Vascular Surgery | Admitting: Vascular Surgery

## 2021-01-03 ENCOUNTER — Ambulatory Visit (INDEPENDENT_AMBULATORY_CARE_PROVIDER_SITE_OTHER): Payer: Medicare Other | Admitting: Physician Assistant

## 2021-01-03 ENCOUNTER — Ambulatory Visit (HOSPITAL_COMMUNITY)
Admission: RE | Admit: 2021-01-03 | Discharge: 2021-01-03 | Disposition: A | Discharge status: Home / Self Care | Payer: Medicare Other | Source: Ambulatory Visit | Attending: Vascular Surgery | Admitting: Vascular Surgery

## 2021-01-03 VITALS — BP 167/78 | HR 103 | Temp 97.4°F | Wt 196.3 lb

## 2021-01-03 DIAGNOSIS — I739 Peripheral vascular disease, unspecified: Secondary | ICD-10-CM

## 2021-01-03 MED ORDER — OXYCODONE-ACETAMINOPHEN 5-325 MG PO TABS
1.0000 | ORAL_TABLET | ORAL | 0 refills | Status: DC | PRN
Start: 2021-01-03 — End: 2021-03-26

## 2021-01-03 NOTE — Progress Notes (Signed)
POST OPERATIVE OFFICE NOTE    CC:  F/u for surgery  HPI:  This is a 65 y.o. male who presented post angiogram by Dr. Fletcher [name] with right LE/foot rest pain.  He has a history of  aortobifemoral bypass graft performed at [facility] s/p  Procedure:   #1: Right femoral endarterectomy with Dacron patch angioplasty                       #2: Redo right femoral artery exposure                       #3: Right iliofemoral thrombectomy  on 12/28/20 by Dr. Trula Slade.    Pt returns today for follow up.  Pt states he has had increased swelling and pain mainly surrounding the upper and medial thigh.  He states his right foot feels better than prior to surgery.  He sits in a chir with a foot stool to elevate his leg.  He denise rest pain in the lower right leg.  He was feeling better with the oxycodone and states the tramadol is not helping.    Allergies  Allergen Reactions   Lisinopril Hives and Itching   Amitriptyline Other (See Comments)    Makes pt feel "weird" and nausea.   Contrast Media [Iodinated Diagnostic Agents] Hives and Itching    Pt can use if taking benadryl    Ibuprofen Other (See Comments)    Internal bleeding    Current Outpatient Medications  Medication Sig Dispense Refill   acetaminophen (TYLENOL) 500 MG tablet Take 1,000 mg by mouth every 6 (six) hours as needed for mild pain.     albuterol (PROAIR HFA) 108 (90 Base) MCG/ACT inhaler USE 2 PUFFS EVERY 6 HOURS  AS NEEDED FOR WHEEZING OR  SHORTNESS OF BREATH (Patient taking differently: Inhale 2 puffs into the lungs every 6 (six) hours as needed for wheezing or shortness of breath.) 34 g 3   albuterol (PROVENTIL) (2.5 MG/3ML) 0.083% nebulizer solution Take 3 mLs (2.5 mg total) by nebulization every 6 (six) hours as needed for wheezing or shortness of breath. 75 mL 5   aspirin EC 81 MG tablet Take 81 mg by mouth daily.     atorvastatin (LIPITOR) 80 MG tablet Take 1 tablet (80 mg total) by mouth daily. 90 tablet 3   Blood Glucose  Monitoring Suppl (ONETOUCH VERIO) w/Device KIT 1 Device by Does not apply route once. 1 kit 0   Camphor (JOINTFLEX EX) Apply 1 application topically daily as needed (pain).     carvedilol (COREG) 12.5 MG tablet Take 1 tablet (12.5 mg total) by mouth 2 (two) times daily with a meal. 60 tablet 3   colchicine 0.6 MG tablet TAKE 2 TABLETS BY MOUTH AS  DIRECTED THEN 1 TABLET  AFTER 1 HOUR. CAN TAKE 1  TABLET TWICE DAILY  THEREAFTER. (Patient taking differently: Take 0.6 mg by mouth 2 (two) times daily as needed (gout flare).) 60 tablet 11   DULoxetine (CYMBALTA) 60 MG capsule Take 1 capsule (60 mg total) by mouth daily. For Neuropathy 30 capsule 6   ezetimibe (ZETIA) 10 MG tablet Take 1 tablet (10 mg total) by mouth daily. 90 tablet 3   fexofenadine (ALLEGRA ALLERGY) 180 MG tablet Take 1 tablet (180 mg total) by mouth daily. 90 tablet 3   Fluticasone-Umeclidin-Vilant (TRELEGY ELLIPTA) 100-62.5-25 MCG/INH AEPB Inhale 1 puff into the lungs daily. 2 each 5   glucose blood (ONETOUCH  ULTRA) test strip Use as instructed 300 strip 0   losartan (COZAAR) 25 MG tablet TAKE 1 TABLET BY MOUTH  DAILY 90 tablet 0   methocarbamol (ROBAXIN) 500 MG tablet Take 500 mg by mouth every 6 (six) hours as needed for muscle spasms.     ONE TOUCH LANCETS MISC 1 each by Does not apply route 3 (three) times daily. 200 each 5   OVER THE COUNTER MEDICATION Apply 1 application topically daily as needed (neuropathy pain). Hemp oil cream     OVER THE COUNTER MEDICATION Apply 1 application topically daily as needed (pain). Hempvana cream     oxyCODONE-acetaminophen (PERCOCET) 5-325 MG tablet Take 1 tablet by mouth every 4 (four) hours as needed for severe pain. 30 tablet 0   predniSONE (DELTASONE) 50 MG tablet Take one tablet by mouth 12/26/20 at 7:30 PM, one tablet 12/27/20 at 1:30 AM, one tablet AND benadryl 50 mg 12/27/20 just prior to leaving home for hospital 3 tablet 0   pregabalin (LYRICA) 100 MG capsule Take 1 capsule (100 mg  total) by mouth daily. 60 capsule 5   traMADol (ULTRAM) 50 MG tablet Take 2 tablets (100 mg total) by mouth every 6 (six) hours as needed for moderate pain. 20 tablet 0   traZODone (DESYREL) 100 MG tablet TAKE 1 TABLET BY MOUTH AT  BEDTIME (Patient taking differently: Take 100 mg by mouth at bedtime as needed for sleep.) 90 tablet 2   pantoprazole (PROTONIX) 40 MG tablet Take 1 tablet (40 mg total) by mouth 2 (two) times daily before a meal. 60 tablet 0   No current facility-administered medications for this visit.     ROS:  See HPI  Physical Exam:    Incision:  right groin is soft without drainage or opening  Extremities:  doppler signal PT/AT/peroneal signals intact  Mild/moderate edema with soft compartments right thigh and LE.   +------------+----------+---------+------+----------+  Right DuplexPSV (cm/s)Waveform PlaqueComment(s)  +------------+----------+---------+------+----------+  CFA             32                               +------------+----------+---------+------+----------+  Prox SFA       120    triphasic                  +------------+----------+---------+------+----------+   Right Vein comments:CFV not well visualized. The proximal SFV is patent  and compressible.       ABI Findings:  +---------+------------------+-----+----------+--------+  Right    Rt Pressure (mmHg)IndexWaveform  Comment   +---------+------------------+-----+----------+--------+  Brachial 141                                        +---------+------------------+-----+----------+--------+  PTA      64                0.45 monophasic          +---------+------------------+-----+----------+--------+  DP       52                0.37 monophasic          +---------+------------------+-----+----------+--------+  Great Toe                       Absent              +---------+------------------+-----+----------+--------+    +---------+------------------+-----+----------+-------+  Left     Lt Pressure (mmHg)IndexWaveform  Comment  +---------+------------------+-----+----------+-------+  Brachial 140                                       +---------+------------------+-----+----------+-------+  PTA      95                0.67 monophasic         +---------+------------------+-----+----------+-------+  DP       93                0.66 monophasic         +---------+------------------+-----+----------+-------+  Great Toe44                0.31 Abnormal           +---------+------------------+-----+----------+-------+   +-------+-----------+-----------+------------+------------+  ABI/TBIToday's ABIToday's TBIPrevious ABIPrevious TBI  +-------+-----------+-----------+------------+------------+  Right  0.45       0.00       0.34        0.00          +-------+-----------+-----------+------------+------------+  Left   0.67       0.30       1.04        0.55          +-------+-----------+-----------+------------+------------+    Previous ABIs on the left noted to be calcified.     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   Left: Resting left ankle-brachial index indicates moderate left lower  extremity arterial disease. The left toe-brachial index is abnormal.    Assessment/Plan:  This is a 65 y.o. male who is s/p: Procedure:   #1: Right femoral endarterectomy with Dacron patch angioplasty                       #2: Redo right femoral artery exposure                       #3: Right iliofemoral thrombectomy  He has inflow with improved rest pain in the right foot.  He has moderate edema in the right LE.  I demonstrated how he should elevate his leg in the supine position.. The groin incision is healing well without hematoma.  I did refill his oxycodone 1 Q6 for # 30.  He will keep his regular  appointment in < 2 weeks.  If he has problems or concerns he will call our office.     Roxy Horseman PA-C [facility] [phone]   Clinic MD:  Donzetta Matters

## 2021-01-03 NOTE — Telephone Encounter (Signed)
Patient calling back today to report that his leg is still red from the groin to the knee on the right side s/p fem endarterectomy on 10/13. He is having pain in his foot and his heel as well as a burning sensation around his leg. Says the pain is better than prior to the surgery, but is still very tender. Denies fever or chills. Discussed with PA, bringing patient in today for pseudo, ABI and evaluation.

## 2021-01-15 ENCOUNTER — Encounter: Payer: Self-pay | Admitting: Physician Assistant

## 2021-01-15 ENCOUNTER — Ambulatory Visit (INDEPENDENT_AMBULATORY_CARE_PROVIDER_SITE_OTHER): Payer: Medicare Other | Admitting: Physician Assistant

## 2021-01-15 ENCOUNTER — Other Ambulatory Visit: Payer: Self-pay

## 2021-01-15 VITALS — BP 128/70 | HR 61 | Temp 98.0°F | Resp 20 | Ht 68.5 in | Wt 191.9 lb

## 2021-01-15 DIAGNOSIS — I739 Peripheral vascular disease, unspecified: Secondary | ICD-10-CM

## 2021-01-15 NOTE — Progress Notes (Signed)
POST OPERATIVE OFFICE NOTE    CC:  F/u for surgery  HPI:  This is a 65 y.o. male who is s/p redo right femoral artery exposure,  right femoral endarterectomy with Dacron patch angioplasty, right iliofemoral thrombectomy on 12/28/2020 by Dr. Trula Slade.  He is s/p ABF bypass grafting at [facility] many years ago.  He was evaluated in 2019 for worsening right leg claudication.  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  He now is having rest pain in the right leg and claudication in the left.   Pt was seen on 01/03/2021 and was having increased swelling and pain mainly surrounding the upper medial thigh.  His right foot pain was improved from before surgery.   He did have ABI, that were improved at 0.45 from zero on the right.  Pt returns today for follow up and accompanied by his wife of 10 years.  Pt states the drainage from his right groin has resolved.  He has been elevating his legs and his swelling is also better.  He does c/o pain and burning in the inner portion of his right thigh.  It is still present but the burning has gotten some better.  He tells me that before the surgery, he was not able to stand in the shower to shave and wound have to sit down but now he is able to stand and shave without sitting.  He states the pain in his right foot is also better and the sore on the heel is getting better.  He continues to have numbness in both feet.  He tells me he has small vessels in the lower leg and said he had talked with Dr. Trula Slade about possible surgery for the left also.  He is not really having any claudication sx on the left.  He states his right leg still cramps but he can walk farther than he could before his surgery.   Allergies  Allergen Reactions   Lisinopril Hives and Itching   Amitriptyline Other (See Comments)    Makes pt feel "weird" and nausea.   Contrast Media [Iodinated Diagnostic Agents] Hives and Itching     Pt can use if taking benadryl    Ibuprofen Other (See Comments)    Internal bleeding    Current Outpatient Medications  Medication Sig Dispense Refill   acetaminophen (TYLENOL) 500 MG tablet Take 1,000 mg by mouth every 6 (six) hours as needed for mild pain.     albuterol (PROAIR HFA) 108 (90 Base) MCG/ACT inhaler USE 2 PUFFS EVERY 6 HOURS  AS NEEDED FOR WHEEZING OR  SHORTNESS OF BREATH (Patient taking differently: Inhale 2 puffs into the lungs every 6 (six) hours as needed for wheezing or shortness of breath.) 34 g 3   albuterol (PROVENTIL) (2.5 MG/3ML) 0.083% nebulizer solution Take 3 mLs (2.5 mg total) by nebulization every 6 (six) hours as needed for wheezing or shortness of breath. 75 mL 5   aspirin EC 81 MG tablet Take 81 mg by mouth daily.     atorvastatin (LIPITOR) 80 MG tablet Take 1 tablet (80 mg total) by mouth daily. 90 tablet 3   Blood Glucose Monitoring Suppl (ONETOUCH VERIO) w/Device KIT 1 Device by Does not apply route once. 1 kit 0   Camphor (JOINTFLEX EX) Apply 1 application topically daily as needed (pain).     carvedilol (COREG) 12.5 MG tablet Take 1 tablet (12.5 mg total) by mouth 2 (two)  times daily with a meal. 60 tablet 3   colchicine 0.6 MG tablet TAKE 2 TABLETS BY MOUTH AS  DIRECTED THEN 1 TABLET  AFTER 1 HOUR. CAN TAKE 1  TABLET TWICE DAILY  THEREAFTER. (Patient taking differently: Take 0.6 mg by mouth 2 (two) times daily as needed (gout flare).) 60 tablet 11   DULoxetine (CYMBALTA) 60 MG capsule Take 1 capsule (60 mg total) by mouth daily. For Neuropathy 30 capsule 6   ezetimibe (ZETIA) 10 MG tablet Take 1 tablet (10 mg total) by mouth daily. 90 tablet 3   fexofenadine (ALLEGRA ALLERGY) 180 MG tablet Take 1 tablet (180 mg total) by mouth daily. 90 tablet 3   Fluticasone-Umeclidin-Vilant (TRELEGY ELLIPTA) 100-62.5-25 MCG/INH AEPB Inhale 1 puff into the lungs daily. 2 each 5   glucose blood (ONETOUCH ULTRA) test strip Use as instructed 300 strip 0   losartan (COZAAR) 25  MG tablet TAKE 1 TABLET BY MOUTH  DAILY 90 tablet 0   methocarbamol (ROBAXIN) 500 MG tablet Take 500 mg by mouth every 6 (six) hours as needed for muscle spasms.     ONE TOUCH LANCETS MISC 1 each by Does not apply route 3 (three) times daily. 200 each 5   OVER THE COUNTER MEDICATION Apply 1 application topically daily as needed (neuropathy pain). Hemp oil cream     OVER THE COUNTER MEDICATION Apply 1 application topically daily as needed (pain). Hempvana cream     oxyCODONE-acetaminophen (PERCOCET) 5-325 MG tablet Take 1 tablet by mouth every 4 (four) hours as needed for severe pain. 30 tablet 0   pantoprazole (PROTONIX) 40 MG tablet Take 1 tablet (40 mg total) by mouth 2 (two) times daily before a meal. 60 tablet 0   predniSONE (DELTASONE) 50 MG tablet Take one tablet by mouth 12/26/20 at 7:30 PM, one tablet 12/27/20 at 1:30 AM, one tablet AND benadryl 50 mg 12/27/20 just prior to leaving home for hospital 3 tablet 0   pregabalin (LYRICA) 100 MG capsule Take 1 capsule (100 mg total) by mouth daily. 60 capsule 5   traMADol (ULTRAM) 50 MG tablet Take 2 tablets (100 mg total) by mouth every 6 (six) hours as needed for moderate pain. 20 tablet 0   traZODone (DESYREL) 100 MG tablet TAKE 1 TABLET BY MOUTH AT  BEDTIME (Patient taking differently: Take 100 mg by mouth at bedtime as needed for sleep.) 90 tablet 2   No current facility-administered medications for this visit.     ROS:  See HPI  Physical Exam:  Today's Vitals   01/15/21 1029  BP: 128/70  Pulse: 61  Resp: 20  Temp: 98 F (36.7 C)  TempSrc: Temporal  SpO2: 98%  Weight: 191 lb 14.4 oz (87 kg)  Height: 5' 8.5" (1.74 m)  PainSc: 8   PainLoc: Leg   Body mass index is 28.75 kg/m.   Incision:  right groin healing nicely.  There is a hematoma present Extremities:  right foot is warm and well perfused.  Motor in tact.  Sensory not intact.  (Doppler not available for evaluation)    Assessment/Plan:  This is a 65 y.o. male who  is s/p: redo right femoral artery exposure,  right femoral endarterectomy with Dacron patch angioplasty, right iliofemoral thrombectomy on 12/28/2020 by Dr. Trula Slade and hx of ABF bypass grafting at [facility] many years ago.  -pt was seen a couple of weeks ago for swelling of the right leg and drainage from the right groin incision.  The drainage  has resolved and the incision is healing nicely.  He has been elevating his leg and the swelling has improved. -his preoperative right foot pain is improved.  Although he does still have some claudication in the right leg, his walking distance and stamina for standing has improved.  His ABI at the last visit was improved.  He also had a wound on his right heel and this is improving. -discussed that the burning on the inner thigh was most likely related to nerve irritation from surgery.  We discussed that given this was a redo surgery, it is sometimes difficult to determine the nerve definitely.  This has already somewhat improved.  Hopeful it will continue to improve.   -he is diabetic and continues to smoke.  Discussed importance of smoking cessation and given he is diabetic and he has PAD, he is certainly at higher risk for limb loss in the future.  Also discussed his at high risk for stroke and heart attack.   -he wants to discuss surgery for the left side-we discussed that since he does not have any rest pain or claudication on the left, we would have him continue a walking program, work on smoking cessation and return in 3 months with repeat studies and see Dr. Trula Slade to further discuss.  He understands that if he develops any wounds prior to that visit, he will call sooner.  He and his wife are in agreement with this plan.   -continue asa/statin   Leontine Locket, [facility] [phone]   Clinic MD:  Trula Slade

## 2021-01-17 ENCOUNTER — Other Ambulatory Visit: Payer: Self-pay

## 2021-01-17 DIAGNOSIS — I739 Peripheral vascular disease, unspecified: Secondary | ICD-10-CM

## 2021-01-30 ENCOUNTER — Telehealth: Payer: Self-pay | Admitting: *Deleted

## 2021-01-30 ENCOUNTER — Ambulatory Visit: Payer: Medicare Other | Admitting: Cardiovascular Disease

## 2021-01-30 ENCOUNTER — Other Ambulatory Visit: Payer: Self-pay

## 2021-01-30 ENCOUNTER — Encounter: Payer: Self-pay | Admitting: Cardiovascular Disease

## 2021-01-30 VITALS — BP 140/82 | HR 60 | Ht 68.5 in | Wt 196.2 lb

## 2021-01-30 DIAGNOSIS — I739 Peripheral vascular disease, unspecified: Secondary | ICD-10-CM

## 2021-01-30 DIAGNOSIS — I2510 Atherosclerotic heart disease of native coronary artery without angina pectoris: Secondary | ICD-10-CM | POA: Diagnosis not present

## 2021-01-30 DIAGNOSIS — E785 Hyperlipidemia, unspecified: Secondary | ICD-10-CM

## 2021-01-30 DIAGNOSIS — Z720 Tobacco use: Secondary | ICD-10-CM

## 2021-01-30 MED ORDER — EZETIMIBE 10 MG PO TABS: 10.0000 mg | ORAL_TABLET | Freq: Every day | ORAL | 3 refills | Status: DC

## 2021-01-30 MED ORDER — ROSUVASTATIN CALCIUM 40 MG PO TABS: 40.0000 mg | ORAL_TABLET | Freq: Every day | ORAL | 3 refills | Status: DC

## 2021-01-30 NOTE — Patient Instructions (Signed)
Medication Instructions:  STOP atorvastatin (Lipitor) START rosuvastatin (Crestor) 40 mg daily START Zetia 10 mg daily  *If you need a refill on your cardiac medications before your next appointment, please call your pharmacy*   Lab Work: Please return for FASTING labs in 2 months (Lipid, Hepatic)  Our in office lab hours are Monday-Friday 8:00-4:00, closed for lunch 12:45-1:45 pm.  No appointment needed.  Follow-Up: At [facility], you and your health needs are our priority.  As part of our continuing mission to provide you with exceptional heart care, we have created designated Provider Care Teams.  These Care Teams include your primary Cardiologist (physician) and Advanced Practice Providers (APPs -  Physician Assistants and Nurse Practitioners) who all work together to provide you with the care you need, when you need it.  We recommend signing up for the patient portal called "MyChart".  Sign up information is provided on this After Visit Summary.  MyChart is used to connect with patients for Virtual Visits (Telemedicine).  Patients are able to view lab/test results, encounter notes, upcoming appointments, etc.  Non-urgent messages can be sent to your provider as well.   To learn more about what you can do with MyChart, go to [email or web address].    Your next appointment:   12 month(s)  The format for your next appointment:   In Person  Provider:   Dr. Fletcher [name]    EXERCISE PROGRAM FOR INDIVIDUALS WITH  PERIPHERAL ARTERIAL DISEASE (PAD)   General Information:   Research in vascular exercise has demonstrated remarkable improvement in symptoms of leg pain (claudication) without expensive or invasive interventions. Regular walking programs are extremely helpful for patients with PAD and intermittent claudication.  These steps are designed to help you get started with a safe and effective program to help you walk farther with less pain:   Walk at least three times a week  (preferably every day).  Your goal is to build up to 30-45 minutes of total walking time (not counting rest breaks). It may take you several weeks to build up your exercise time starting at 5-10 minutes or whatever you can tolerate.  Walk as far as possible using moderate to maximal pain (7-8 on the scale below) as a signal to stop, and resume walking when the pain goes away.  On a treadmill, set the speed and grade at a level that brings on the claudication pain within 3 to 5 minutes. Walk at this rate until you experience claudication of moderate severity, rest until the pain improves, and then resume walking.  Over time, you will be able to walk longer at the designated speed and grade; workload should then be increased until you develop the pain within 3 to 5 minutes once again.  This regimen will induce a significant benefit. Studies have demonstrated that participants may be able to walk up to three or four times farther and have less leg pain, within twelve weeks, by following this protocol.  Pain Scale    0_____1_____2_____3_____4_____5_____6_____7_____8_____9_____10   No Pain                                   Moderate Pain                               Maximal Pain

## 2021-01-30 NOTE — Progress Notes (Signed)
Cardiology Office Note   Date:  01/30/2021   ID:  Joshua [name], DOB 1955/09/25, MRN [id number]  PCP:  Charlott Rakes, MD  Cardiologist:  Dr. Marlou Porch  No chief complaint on file.     History of Present Illness: JOHNTAE [name] is a 65 y.o. male who is here today for a follow up visit regarding peripheral arterial disease.   He has known history of coronary artery disease status post rotational atherectomy and drug-eluting stent placement to the mid LAD. He has known history of peripheral arterial disease status post aortobifemoral bypass in 2010 at [place], diabetes mellitus with diabetic neuropathy, hyperlipidemia and tobacco use. The patient does not have left carotid bruit but carotid Doppler showed less than 40% stenosis. He had worsening right leg claudication in 2019 and thus angiography was performed which showed widely patent aortobifemoral bypass.  There was severe calcified stenosis affecting the ostial SFA with moderate calcified disease in the mid and distal segment and three-vessel runoff below the knee.  On the left side, there was 60% ostial stenosis of the left SFA which was stable from before.  I performed orbital atherectomy and drug-coated balloon angioplasty to the ostial and proximal right SFA via the left brachial artery.  He has known history of tobacco use and heavy alcohol use.  He was hospitalized in August with suspected GI bleed and was found to be in atrial fibrillation but converted to sinus rhythm.  He was felt to be not a good anticoagulation candidate.  Echocardiogram showed normal LV systolic function.  He had EGD and colonoscopy and was told about stomach ulcers and colon polyps which were removed.  He was seen recently for rest pain affecting the right lower extremity with significant claudication on the left side. I proceeded with angiography last month which showed patent aorto bifemoral bypass.  On the right side, there was significant stenosis  in the common femoral artery at the outflow of the bypass with severe calcified ostial profunda stenosis and flush occlusion of the SFA with reconstitution distally with moderate calcified stenosis in the popliteal artery and two-vessel runoff below the knee.  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  The patient underwent right common femoral artery endarterectomy and profundoplasty with Dr. Trula Slade.  He had some postoperative swelling that seems to be gradually improving.  In addition, he is having numbness on the medial side of the thigh.  Rest pain has resolved although he continues to have some claudication on both sides.  He is trying to cut down on tobacco use and is down to 5 cigarettes daily.  Past Medical History:  Diagnosis Date   AC (acromioclavicular) joint bone spurs, unspecified laterality 11/25/2016   Arthritis    "back" (05/16/2015)   CAD in native artery, occluded Lcx and rotational atherectomy to LAD with DES 05/17/15 05/18/2015   Cavitary lesion of lung 02/25/2018   Chronic back pain    "all my back"   Chronic bronchitis (HCC)    COPD (chronic obstructive pulmonary disease) ([place])    Diabetes mellitus without complication (HCC)    GERD (gastroesophageal reflux disease)    H/O blood clots    "had them in my back; put filter  in before one of my neck ORs"   History of gout    Hyperlipidemia    Hypertension    Neuromuscular disorder (HCC)    PAD (peripheral artery disease) (HCC)    Phlebitis    Positive TB test    Seasonal allergies    Shortness of breath    Spinal disease     Past Surgical History:  Procedure Laterality Date   ABDOMINAL AORTOGRAM W/LOWER EXTREMITY  01/07/2018    ABDOMINAL AORTOGRAM W/LOWER EXTREMITY N/A 01/07/2018   Procedure: ABDOMINAL AORTOGRAM W/LOWER EXTREMITY;  Surgeon: Wellington Hampshire, MD;  Location: [place] [facility];  Service: Cardiovascular;  Laterality: N/A;   ABDOMINAL AORTOGRAM W/LOWER EXTREMITY N/A 12/27/2020   Procedure: ABDOMINAL AORTOGRAM W/LOWER EXTREMITY;  Surgeon: Wellington Hampshire, MD;  Location: [place] [facility];  Service: Cardiovascular;  Laterality: N/A;   ANTERIOR CERVICAL DECOMP/DISCECTOMY FUSION  ~ 2001-2009 X 3   ANTERIOR CERVICAL DECOMP/DISCECTOMY FUSION N/A 10/06/2012   Procedure: ANTERIOR CERVICAL DECOMPRESSION/DISCECTOMY FUSION 1 LEVEL;  Surgeon: [name], MD;  Location: [place];  Service: Neurosurgery;  Laterality: N/A;  C7T1 anterior cervical decompression with fusion plating and bonegraft    BACK SURGERY     BIOPSY  11/21/2020   Procedure: BIOPSY;  Surgeon: Mauri Pole, MD;  [facility]: [facility];  Service: Endoscopy;;   CARDIAC CATHETERIZATION  05/16/2015   CARDIAC CATHETERIZATION N/A 05/16/2015   Procedure: Left Heart Cath and Coronary Angiography;  Surgeon: [name], MD;  Location: [place] [facility];  Service: Cardiovascular;  Laterality: N/A;   CARDIAC CATHETERIZATION N/A 05/16/2015   Procedure: Coronary Balloon Angioplasty;  Surgeon: [name], MD;  Location: [place] [facility];  Service: Cardiovascular;  Laterality: N/A;   CARDIAC CATHETERIZATION N/A 05/17/2015   Procedure: Coronary Stent Intervention Rotoblater;  Surgeon: [name], MD;  Location: [place] [facility];  Service: Cardiovascular;  Laterality: N/A;   COLONOSCOPY WITH PROPOFOL N/A 11/21/2020   Procedure: COLONOSCOPY WITH PROPOFOL;  Surgeon: Mauri Pole, MD;  [facility];  Service: Endoscopy;  Laterality: N/A;   ELBOW SURGERY Left 2016   "had bone pieces removed"   ENDARTERECTOMY FEMORAL Right 12/28/2020   Procedure: RIGHT FEMORAL ARTERY ENDARTERECTOMY;  Surgeon: Serafina Mitchell, MD;  Location: [place];  Service: Vascular;  Laterality: Right;   ESOPHAGEAL MANOMETRY N/A 12/30/2016   Procedure: ESOPHAGEAL MANOMETRY (EM);  Surgeon: Irene Shipper, MD;  [facility];  Service: Endoscopy;  Laterality: N/A;   ESOPHAGOGASTRODUODENOSCOPY (EGD) WITH PROPOFOL N/A 11/21/2020   Procedure: ESOPHAGOGASTRODUODENOSCOPY (EGD) WITH PROPOFOL;  Surgeon: Mauri Pole, MD;  [facility];  Service: Endoscopy;  Laterality: N/A;   EXCISIONAL HEMORRHOIDECTOMY     FEMORAL BYPASS  ~ 2010   IR RADIOLOGIST EVAL & MGMT  12/17/2016   IVC FILTER PLACEMENT ([place] HX)  2010   "had blood clots in my back"   NECK SURGERY  2014   2008-2014   [facility] ANGIOPLASTY Right 12/28/2020   Procedure: PATCH ANGIOPLASTY USING;  Surgeon: Serafina Mitchell, MD;  Location: [place];  Service: Vascular;  Laterality: Right;   PERIPHERAL VASCULAR ATHERECTOMY  01/07/2018   Procedure: PERIPHERAL VASCULAR ATHERECTOMY;  Surgeon: Wellington Hampshire, MD;  Location: [place] [facility];  Service: Cardiovascular;;   PERIPHERAL VASCULAR BALLOON ANGIOPLASTY  01/07/2018   Procedure: PERIPHERAL VASCULAR BALLOON ANGIOPLASTY;  Surgeon: Wellington Hampshire, MD;  Location: [place] [facility];  Service: Cardiovascular;;   PERIPHERAL VASCULAR CATHETERIZATION N/A  02/14/2016   Procedure: Abdominal Aortogram w/Lower Extremity;  Surgeon: Wellington Hampshire, MD;  Location: [place] [facility];  Service: Cardiovascular;  Laterality: N/A;   POLYPECTOMY  11/21/2020   Procedure: POLYPECTOMY;  Surgeon: Mauri Pole, MD;  [facility];  Service: Endoscopy;;   POSTERIOR LAMINECTOMY / DECOMPRESSION LUMBAR SPINE  2006   "bone spurs"   VIDEO BRONCHOSCOPY WITH ENDOBRONCHIAL NAVIGATION Right 02/25/2018   Procedure: VIDEO BRONCHOSCOPY WITH ENDOBRONCHIAL NAVIGATION;  Surgeon: Garner Nash, DO;  Location: [place];  Service: Thoracic;  Laterality: Right;   VIDEO BRONCHOSCOPY WITH ENDOBRONCHIAL ULTRASOUND Right 02/25/2018   Procedure: VIDEO BRONCHOSCOPY  WITH ENDOBRONCHIAL ULTRASOUND;  Surgeon: Garner Nash, DO;  Location: [place];  Service: Thoracic;  Laterality: Right;     Current Outpatient Medications  Medication Sig Dispense Refill   acetaminophen (TYLENOL) 500 MG tablet Take 1,000 mg by mouth every 6 (six) hours as needed for mild pain.     albuterol (PROAIR HFA) 108 (90 Base) MCG/ACT inhaler USE 2 PUFFS EVERY 6 HOURS  AS NEEDED FOR WHEEZING OR  SHORTNESS OF BREATH (Patient taking differently: Inhale 2 puffs into the lungs every 6 (six) hours as needed for wheezing or shortness of breath.) 34 g 3   albuterol (PROVENTIL) (2.5 MG/3ML) 0.083% nebulizer solution Take 3 mLs (2.5 mg total) by nebulization every 6 (six) hours as needed for wheezing or shortness of breath. 75 mL 5   aspirin EC 81 MG tablet Take 81 mg by mouth daily.     Blood Glucose Monitoring Suppl (ONETOUCH VERIO) w/Device KIT 1 Device by Does not apply route once. 1 kit 0   Camphor (JOINTFLEX EX) Apply 1 application topically daily as needed (pain).     carvedilol (COREG) 12.5 MG tablet Take 1 tablet (12.5 mg total) by mouth 2 (two) times daily with a meal. 60 tablet 3   colchicine 0.6 MG tablet TAKE 2 TABLETS BY MOUTH AS  DIRECTED THEN 1 TABLET  AFTER 1 HOUR. CAN TAKE 1  TABLET TWICE DAILY  THEREAFTER. (Patient taking differently: Take 0.6 mg by mouth 2 (two) times daily as needed (gout flare).) 60 tablet 11   DULoxetine (CYMBALTA) 60 MG capsule Take 1 capsule (60 mg total) by mouth daily. For Neuropathy 30 capsule 6   fexofenadine (ALLEGRA ALLERGY) 180 MG tablet Take 1 tablet (180 mg total) by mouth daily. 90 tablet 3   Fluticasone-Umeclidin-Vilant (TRELEGY ELLIPTA) 100-62.5-25 MCG/INH AEPB Inhale 1 puff into the lungs daily. 2 each 5   glucose blood (ONETOUCH ULTRA) test strip Use as instructed 300 strip 0   losartan (COZAAR) 25 MG tablet TAKE 1 TABLET BY MOUTH  DAILY 90 tablet 0   methocarbamol (ROBAXIN) 500 MG tablet Take 500 mg by mouth every 6 (six) hours as needed for  muscle spasms.     ONE TOUCH LANCETS MISC 1 each by Does not apply route 3 (three) times daily. 200 each 5   OVER THE COUNTER MEDICATION Apply 1 application topically daily as needed (neuropathy pain). Hemp oil cream     OVER THE COUNTER MEDICATION Apply 1 application topically daily as needed (pain). Hempvana cream     oxyCODONE-acetaminophen (PERCOCET) 5-325 MG tablet Take 1 tablet by mouth every 4 (four) hours as needed for severe pain. 30 tablet 0   predniSONE (DELTASONE) 50 MG tablet Take one tablet by mouth 12/26/20 at 7:30 PM, one tablet 12/27/20 at 1:30 AM, one tablet AND benadryl 50 mg 12/27/20 just prior to leaving  home for hospital 3 tablet 0   rosuvastatin (CRESTOR) 40 MG tablet Take 1 tablet (40 mg total) by mouth daily. 90 tablet 3   traMADol (ULTRAM) 50 MG tablet Take 2 tablets (100 mg total) by mouth every 6 (six) hours as needed for moderate pain. 20 tablet 0   traZODone (DESYREL) 100 MG tablet TAKE 1 TABLET BY MOUTH AT  BEDTIME (Patient taking differently: Take 100 mg by mouth at bedtime as needed for sleep.) 90 tablet 2   ezetimibe (ZETIA) 10 MG tablet Take 1 tablet (10 mg total) by mouth daily. 90 tablet 3   pantoprazole (PROTONIX) 40 MG tablet Take 1 tablet (40 mg total) by mouth 2 (two) times daily before a meal. 60 tablet 0   pregabalin (LYRICA) 100 MG capsule Take 1 capsule (100 mg total) by mouth daily. 60 capsule 5   No current facility-administered medications for this visit.    Allergies:   Lisinopril, Amitriptyline, Contrast media [iodinated diagnostic agents], and Ibuprofen    Social History:  The patient  reports that he has been smoking cigarettes. He has a 24.50 pack-year smoking history. He has never used smokeless tobacco. He reports current alcohol use. He reports that he does not use drugs.   Family History:  The patient's family history includes Aneurysm in his brother; Healthy in his daughter and maternal grandmother; Heart attack in his father;  Hypertension in his father and mother; Leukemia in his grandchild; Stomach cancer in his mother; Stroke in his father.    ROS:  Please see the history of present illness.   Otherwise, review of systems are positive for none.   All other systems are reviewed and negative.    PHYSICAL EXAM: VS:  BP 140/82   Pulse 60   Ht 5' 8.5" (1.74 m)   Wt 196 lb 3.2 oz (89 kg)   SpO2 99%   BMI 29.40 kg/m  , BMI Body mass index is 29.4 kg/m. GEN: Well nourished, well developed, in no acute distress  HEENT: normal  Neck: no JVD, carotid bruits, or masses Cardiac: RRR; no murmurs, rubs, or gallops,no edema  Respiratory: Bilateral rhonchi and mild expiratory wheezing. GI: soft, nontender, nondistended, + BS MS: no deformity or atrophy  Skin: warm and dry, no rash Neuro:  Strength and sensation are intact Psych: euthymic mood, full affect Vascular: Surgical site to the right groin seems to be healing well with mild swelling and no ecchymosis.  Distal pulses are not palpable.    EKG:  EKG is not ordered today.   Recent Labs: 11/15/2020: TSH 2.119 11/21/2020: ALT 39; Magnesium 1.2 12/29/2020: BUN 13; Creatinine, Ser 0.78; Hemoglobin 11.5; Platelets 193; Potassium 4.0; Sodium 135    Lipid Panel    Component Value Date/Time   CHOL 203 (H) 12/29/2020 0256   CHOL 215 (H) 11/21/2017 0810   TRIG 103 12/29/2020 0256   HDL 50 12/29/2020 0256   HDL 126 11/21/2017 0810   CHOLHDL 4.1 12/29/2020 0256   VLDL 21 12/29/2020 0256   LDLCALC 132 (H) 12/29/2020 0256   LDLCALC 75 07/09/2019 1435   LDLDIRECT 107.0 04/21/2015 1352      Wt Readings from Last 3 Encounters:  01/30/21 196 lb 3.2 oz (89 kg)  01/15/21 191 lb 14.4 oz (87 kg)  01/03/21 196 lb 4.8 oz (89 kg)         ASSESSMENT AND PLAN:  1.   Peripheral arterial disease: Status post aortobifemoral bypass .  Status post recent right  common femoral artery endarterectomy and profundoplasty.  He continues to have significant disease affecting  the right SFA with long occlusion as well as the popliteal artery.  In addition, he has significant disease on the left side.  Continue medical therapy for now and I asked him to start a walking exercise program.  I strongly encouraged him to quit smoking. If he requires revascularization on the left side, that we will be via common femoral artery endarterectomy into the ostium of both SFA and profunda.  2. Coronary artery disease: Status post atherectomy and drug-eluting stent placement to the LAD.  The patient has no angina.  Continue low-dose aspirin.  3. Tobacco use: He is trying to cut down on his own.  I discussed with him the importance of smoking cessation.  4. Hyperlipidemia: Most recent lipid profile showed an LDL of 130 which is are not at target.  He has not been taking Zetia.  I elected to switch atorvastatin to rosuvastatin 40 mg daily and add Zetia 10 mg daily.  Recheck lipid and liver profile in 6 weeks.  5.  Excessive alcohol use: He reports that he quit drinking after his most recent hospitalization in August.  6.  Essential hypertension: Blood pressure is well controlled.  7.  Paroxysmal atrial fibrillation: He is currently in sinus rhythm.  I agree that he is not a good candidate for anticoagulation given recent GI bleed and history of excessive alcohol use.    Disposition:   FU with me in 4 months.  Signed,  Kathlyn Sacramento, MD  01/30/2021 1:21 PM    [facility]

## 2021-01-30 NOTE — Telephone Encounter (Signed)
CALLED PATIENT TO ASK IF HE WOULD BE WILLING TO DO A SCAN, LVM FOR A RETURN CALL

## 2021-01-31 ENCOUNTER — Telehealth: Payer: Self-pay | Admitting: Family Medicine

## 2021-01-31 DIAGNOSIS — K253 Acute gastric ulcer without hemorrhage or perforation: Secondary | ICD-10-CM

## 2021-01-31 DIAGNOSIS — K269 Duodenal ulcer, unspecified as acute or chronic, without hemorrhage or perforation: Secondary | ICD-10-CM

## 2021-01-31 NOTE — Telephone Encounter (Signed)
Medication Refill - Medication:  omeprazole (PRILOSEC) 40 MG capsule   Has the patient contacted their pharmacy? Yes.   (Agent: If no, request that the patient contact the pharmacy for the refill. If patient does not wish to contact the pharmacy document the reason why and proceed with request.) (Agent: If yes, when and what did the pharmacy advise?)call PCP since you have switched pharmacies   Preferred Pharmacy (with phone number or street name):  [facility] ([facility]) - [place], [place] Phone:  [phone]  Fax:  [phone]      Has the patient been seen for an appointment in the last year OR does the patient have an upcoming appointment? Yes.    Agent: Please be advised that RX refills may take up to 3 business days. We ask that you follow-up with your pharmacy.

## 2021-01-31 NOTE — Telephone Encounter (Signed)
Medication has never been filled by you.  Ok to fill medication for patient.

## 2021-02-01 NOTE — Telephone Encounter (Signed)
Med list reveals pantoprazole and not omeprazole which of these is he requesting?

## 2021-02-02 ENCOUNTER — Other Ambulatory Visit: Payer: Self-pay

## 2021-02-02 MED ORDER — OMEPRAZOLE 40 MG PO CPDR: 40.0000 mg | DELAYED_RELEASE_CAPSULE | Freq: Every day | ORAL | 3 refills | Status: DC

## 2021-02-02 NOTE — Telephone Encounter (Signed)
Patient was called and patient states that he is taking Omeprazole 40 mg Capsule.  Refill for Omeprazole has been sent to his [employer].

## 2021-02-07 ENCOUNTER — Other Ambulatory Visit: Payer: Self-pay | Admitting: Family Medicine

## 2021-02-07 ENCOUNTER — Other Ambulatory Visit: Payer: Self-pay | Admitting: Cardiovascular Disease

## 2021-02-07 ENCOUNTER — Other Ambulatory Visit: Payer: Self-pay | Admitting: Family

## 2021-02-07 DIAGNOSIS — Z79899 Other long term (current) drug therapy: Secondary | ICD-10-CM

## 2021-02-07 DIAGNOSIS — I739 Peripheral vascular disease, unspecified: Secondary | ICD-10-CM

## 2021-02-07 DIAGNOSIS — E1159 Type 2 diabetes mellitus with other circulatory complications: Secondary | ICD-10-CM

## 2021-02-07 DIAGNOSIS — I152 Hypertension secondary to endocrine disorders: Secondary | ICD-10-CM

## 2021-02-07 DIAGNOSIS — I10 Essential (primary) hypertension: Secondary | ICD-10-CM

## 2021-02-07 NOTE — Telephone Encounter (Signed)
Refilled 12/07/2020 #60 with 3 refills. Pt should have enough medication to last until 03/2021

## 2021-02-07 NOTE — Telephone Encounter (Signed)
Refill Request.  

## 2021-02-07 NOTE — Telephone Encounter (Signed)
Requested medication (s) are due for refill today  No. Last ordered 90 day supply on 12/19/20  Requested medication (s) are on the active medication list  Yes  Future visit scheduled Yes 03/05/21  Note to clinic-requesting one year supply. Routing to physician for review.   Requested Prescriptions  Pending Prescriptions Disp Refills   ONETOUCH ULTRA test strip [Pharmacy Med Name: OneTouch Ultra In Vitro Strip] 300 strip 3    Sig: USE AS DIRECTED     Endocrinology: Diabetes - Testing Supplies Passed - 02/07/2021 10:55 AM      Passed - Valid encounter within last 12 months    Recent Outpatient Visits           2 months ago Type 2 diabetes mellitus with peripheral neuropathy ([place])   [place], Charlane Ferretti, MD   9 years ago HTN (hypertension)   Primary Care at [facility], Fenton Malling, MD       Future Appointments             In 3 weeks Charlott Rakes, MD [place]

## 2021-02-07 NOTE — Telephone Encounter (Signed)
Call to patient- he will check with mail order pharmacy to make sure they have Rx on fill and will notify office if they do not.  Requested Prescriptions  Pending Prescriptions Disp Refills  . carvedilol (COREG) 12.5 MG tablet [Pharmacy Med Name: Carvedilol 12.5 MG Oral Tablet] 180 tablet 3    Sig: TAKE 1 TABLET BY MOUTH  TWICE DAILY WITH MEALS     Cardiovascular:  Beta Blockers Failed - 02/07/2021  6:21 AM      Failed - Last BP in normal range    BP Readings from Last 1 Encounters:  01/30/21 140/82         Passed - Last Heart Rate in normal range    Pulse Readings from Last 1 Encounters:  01/30/21 60         Passed - Valid encounter within last 6 months    Recent Outpatient Visits          2 months ago Type 2 diabetes mellitus with peripheral neuropathy ([place])   [place], Charlane Ferretti, MD   9 years ago HTN (hypertension)   Primary Care at [facility], Fenton Malling, MD      Future Appointments            In 3 weeks Charlott Rakes, MD [place]

## 2021-02-22 ENCOUNTER — Other Ambulatory Visit: Payer: Self-pay | Admitting: Family Medicine

## 2021-02-22 DIAGNOSIS — E1159 Type 2 diabetes mellitus with other circulatory complications: Secondary | ICD-10-CM

## 2021-02-23 NOTE — Telephone Encounter (Signed)
Requested Prescriptions  Pending Prescriptions Disp Refills  . carvedilol (COREG) 12.5 MG tablet [Pharmacy Med Name: Carvedilol 12.5 MG Oral Tablet] 60 tablet 0    Sig: TAKE 1 TABLET BY MOUTH  TWICE DAILY WITH MEALS     Cardiovascular:  Beta Blockers Failed - 02/22/2021 11:41 PM      Failed - Last BP in normal range    BP Readings from Last 1 Encounters:  01/30/21 140/82         Passed - Last Heart Rate in normal range    Pulse Readings from Last 1 Encounters:  01/30/21 60         Passed - Valid encounter within last 6 months    Recent Outpatient Visits          2 months ago Type 2 diabetes mellitus with peripheral neuropathy ([place])   [place], Charlane Ferretti, MD   9 years ago HTN (hypertension)   Primary Care at [facility], Fenton Malling, MD      Future Appointments            In 1 week Charlott Rakes, MD [place]

## 2021-02-27 ENCOUNTER — Telehealth (INDEPENDENT_AMBULATORY_CARE_PROVIDER_SITE_OTHER): Payer: Self-pay | Admitting: Family Medicine

## 2021-02-27 NOTE — Telephone Encounter (Signed)
Dr. Margarita Rana on vacation 12/19. Left vm for patient to call [phone] to reschedule appt.

## 2021-03-01 ENCOUNTER — Telehealth: Payer: Self-pay | Admitting: Family Medicine

## 2021-03-01 ENCOUNTER — Other Ambulatory Visit: Payer: Self-pay | Admitting: Family Medicine

## 2021-03-01 ENCOUNTER — Other Ambulatory Visit: Payer: Self-pay | Admitting: Family

## 2021-03-01 ENCOUNTER — Other Ambulatory Visit: Payer: Self-pay | Admitting: Cardiovascular Disease

## 2021-03-01 DIAGNOSIS — I10 Essential (primary) hypertension: Secondary | ICD-10-CM

## 2021-03-01 DIAGNOSIS — I739 Peripheral vascular disease, unspecified: Secondary | ICD-10-CM

## 2021-03-01 DIAGNOSIS — Z79899 Other long term (current) drug therapy: Secondary | ICD-10-CM

## 2021-03-01 NOTE — Telephone Encounter (Signed)
Best contact: [phone]  [facility] will refax prescription form, they need to have the form redone. Original form has errors, they cannot accept it with mark ups or scratch outs. It makes the form invalid.   Diabetic Testing supply form

## 2021-03-02 NOTE — Telephone Encounter (Signed)
Noted will look out for paperwork

## 2021-03-05 ENCOUNTER — Ambulatory Visit: Payer: Medicare Other | Admitting: Family Medicine

## 2021-03-07 ENCOUNTER — Other Ambulatory Visit: Payer: Self-pay | Admitting: Family Medicine

## 2021-03-07 NOTE — Telephone Encounter (Signed)
Rx filled 02/02/2021 #30 with 3 refills. Pt should have ample supply. Dr. Margarita Rana ok'd rx 01/31/2021 in note.

## 2021-03-08 NOTE — Telephone Encounter (Signed)
Requested medication (s) are due for refill today:   Yes  Requested medication (s) are on the active medication list:   Yes  Future visit scheduled:   No Seen 3 mo. ago   Last ordered: 02/02/2021 #30, 3 refills  Returned because mail order pharmacy is requesting a 1 yr. Supply.  Thanks   Requested Prescriptions  Pending Prescriptions Disp Refills   omeprazole (PRILOSEC) 40 MG capsule [Pharmacy Med Name: Omeprazole 40 MG Oral Capsule Delayed Release] 90 capsule 0    Sig: TAKE 1 CAPSULE BY MOUTH DAILY     Gastroenterology: Proton Pump Inhibitors Passed - 03/07/2021 11:20 PM      Passed - Valid encounter within last 12 months    Recent Outpatient Visits           3 months ago Type 2 diabetes mellitus with peripheral neuropathy ([place])   [place], Charlane Ferretti, MD   9 years ago HTN (hypertension)   Primary Care at [facility], Fenton Malling, MD

## 2021-03-14 DIAGNOSIS — E559 Vitamin D deficiency, unspecified: Secondary | ICD-10-CM | POA: Diagnosis not present

## 2021-03-14 DIAGNOSIS — Z1159 Encounter for screening for other viral diseases: Secondary | ICD-10-CM | POA: Diagnosis not present

## 2021-03-14 DIAGNOSIS — Z131 Encounter for screening for diabetes mellitus: Secondary | ICD-10-CM | POA: Diagnosis not present

## 2021-03-14 DIAGNOSIS — E7800 Pure hypercholesterolemia, unspecified: Secondary | ICD-10-CM | POA: Diagnosis not present

## 2021-03-14 DIAGNOSIS — M129 Arthropathy, unspecified: Secondary | ICD-10-CM | POA: Diagnosis not present

## 2021-03-14 DIAGNOSIS — I10 Essential (primary) hypertension: Secondary | ICD-10-CM | POA: Diagnosis not present

## 2021-03-14 DIAGNOSIS — F17210 Nicotine dependence, cigarettes, uncomplicated: Secondary | ICD-10-CM | POA: Diagnosis not present

## 2021-03-14 DIAGNOSIS — Z0000 Encounter for general adult medical examination without abnormal findings: Secondary | ICD-10-CM | POA: Diagnosis not present

## 2021-03-14 DIAGNOSIS — Z79899 Other long term (current) drug therapy: Secondary | ICD-10-CM | POA: Diagnosis not present

## 2021-03-14 DIAGNOSIS — R5383 Other fatigue: Secondary | ICD-10-CM | POA: Diagnosis not present

## 2021-03-14 DIAGNOSIS — M545 Low back pain, unspecified: Secondary | ICD-10-CM | POA: Diagnosis not present

## 2021-03-14 DIAGNOSIS — G8929 Other chronic pain: Secondary | ICD-10-CM | POA: Diagnosis not present

## 2021-03-21 NOTE — Telephone Encounter (Signed)
Caller name: Roxanne  Relation to pt: from [employer]  Call back number: [phone] fax # [phone]    Reason for call:  Checking on the status of diabetic supply form, form was received but section 2 was crossed out, insurance company will not accept form crossed out. Caller requesting forms refax. Please note when faxed back.

## 2021-03-26 ENCOUNTER — Ambulatory Visit (HOSPITAL_COMMUNITY)
Admission: RE | Admit: 2021-03-26 | Discharge: 2021-03-26 | Disposition: A | Discharge status: Home / Self Care | Payer: Medicare Other | Source: Ambulatory Visit | Attending: Surgery | Admitting: Surgery

## 2021-03-26 ENCOUNTER — Encounter: Payer: Self-pay | Admitting: Surgery

## 2021-03-26 ENCOUNTER — Other Ambulatory Visit: Payer: Self-pay

## 2021-03-26 ENCOUNTER — Ambulatory Visit (INDEPENDENT_AMBULATORY_CARE_PROVIDER_SITE_OTHER)
Admission: RE | Admit: 2021-03-26 | Discharge: 2021-03-26 | Disposition: A | Discharge status: Home / Self Care | Payer: Medicare Other | Source: Ambulatory Visit | Attending: Surgery | Admitting: Surgery

## 2021-03-26 ENCOUNTER — Ambulatory Visit: Payer: Medicare Other | Admitting: Surgery

## 2021-03-26 VITALS — BP 150/75 | HR 56 | Temp 98.3°F | Resp 20 | Ht 68.5 in | Wt 206.0 lb

## 2021-03-26 DIAGNOSIS — I739 Peripheral vascular disease, unspecified: Secondary | ICD-10-CM | POA: Insufficient documentation

## 2021-03-26 DIAGNOSIS — I70213 Atherosclerosis of native arteries of extremities with intermittent claudication, bilateral legs: Secondary | ICD-10-CM

## 2021-03-26 NOTE — H&P (View-Only) (Signed)
[facility] of [place]  Patient name: Joshua [name] MRN: [id number] DOB: 1955-03-26 Sex: male   REASON FOR VISIT:    Follow up  HISOTRY OF PRESENT ILLNESS:    Joshua [name] is a 66 y.o. male who is status post right iliofemoral endarterectomy with Dacron patch angioplasty on 12/28/2020.  He has a history of an aortobifemoral bypass graft performed at [facility] many years ago.  This was done for a right heel wound.  He did have drainage and swelling after his surgery.  He is still having issues with leg swelling.  His biggest complaint is still with claudication and numbness and burning in his feet, which is his baseline neuropathy.  Patient has a history of coronary artery disease status post PCI.  He is a diabetic with diabetic neuropathy.  He is a current smoker.  He is on a statin for hypercholesterolemia. PAST MEDICAL HISTORY:   Past Medical History:  Diagnosis Date   AC (acromioclavicular) joint bone spurs, unspecified laterality 11/25/2016   Arthritis    "back" (05/16/2015)   CAD in native artery, occluded Lcx and rotational atherectomy to LAD with DES 05/17/15 05/18/2015   Cavitary lesion of lung 02/25/2018   Chronic back pain    "all my back"   Chronic bronchitis (HCC)    COPD (chronic obstructive pulmonary disease) ([place])    Diabetes mellitus without complication (HCC)    GERD (gastroesophageal reflux disease)    H/O blood clots    "had them in my back; put filter in before one of my neck ORs"   History of gout    Hyperlipidemia    Hypertension    Neuromuscular disorder (HCC)    PAD (peripheral artery disease) (HCC)    Phlebitis    Positive TB test    Seasonal allergies    Shortness of breath    Spinal disease      FAMILY HISTORY:   Family History  Problem Relation Age of Onset   Stomach cancer Mother        Deceased, 67s   Hypertension Mother    Hypertension Father    Stroke Father     Heart attack Father        Deceased, 67   Aneurysm Brother    Healthy Daughter    Healthy Maternal Grandmother    Leukemia Grandchild    Diabetes Neg Hx    Early death Neg Hx    Colon cancer Neg Hx    Pancreatic cancer Neg Hx     SOCIAL HISTORY:   Social History   Tobacco Use   Smoking status: Former    Packs/day: 0.50    Years: 49.00    Pack years: 24.50    Types: Cigarettes    Quit date: 02/09/2021    Years since quitting: 0.1   Smokeless tobacco: Never   Tobacco comments:    half a pack a day   Substance Use Topics   Alcohol use: Yes    Alcohol/week: 0.0 standard drinks    Comment: 2 drinks a week     ALLERGIES:   Allergies  Allergen Reactions   Lisinopril Hives and Itching   Amitriptyline Other (See Comments)    Makes pt feel "weird" and nausea.   Contrast Media [  Iodinated Contrast Media] Hives and Itching    Pt can use if taking benadryl    Ibuprofen Other (See Comments)    Internal bleeding     CURRENT MEDICATIONS:   Current Outpatient Medications  Medication Sig Dispense Refill   acetaminophen (TYLENOL) 500 MG tablet Take 1,000 mg by mouth every 6 (six) hours as needed for mild pain.     albuterol (PROAIR HFA) 108 (90 Base) MCG/ACT inhaler USE 2 PUFFS EVERY 6 HOURS  AS NEEDED FOR WHEEZING OR  SHORTNESS OF BREATH (Patient taking differently: Inhale 2 puffs into the lungs every 6 (six) hours as needed for wheezing or shortness of breath.) 34 g 3   albuterol (PROVENTIL) (2.5 MG/3ML) 0.083% nebulizer solution Take 3 mLs (2.5 mg total) by nebulization every 6 (six) hours as needed for wheezing or shortness of breath. 75 mL 5   aspirin EC 81 MG tablet Take 81 mg by mouth daily.     Blood Glucose Monitoring Suppl (ONETOUCH VERIO) w/Device KIT 1 Device by Does not apply route once. 1 kit 0   Camphor (JOINTFLEX EX) Apply 1 application topically daily as needed (pain).     carvedilol (COREG) 12.5 MG tablet TAKE 1 TABLET BY MOUTH  TWICE DAILY WITH MEALS 60 tablet  0   colchicine 0.6 MG tablet TAKE 2 TABLETS BY MOUTH AS  DIRECTED THEN 1 TABLET  AFTER 1 HOUR. CAN TAKE 1  TABLET TWICE DAILY  THEREAFTER. (Patient taking differently: Take 0.6 mg by mouth 2 (two) times daily as needed (gout flare).) 60 tablet 11   DULoxetine (CYMBALTA) 60 MG capsule Take 1 capsule (60 mg total) by mouth daily. For Neuropathy 30 capsule 6   ezetimibe (ZETIA) 10 MG tablet Take 1 tablet (10 mg total) by mouth daily. 90 tablet 3   fexofenadine (ALLEGRA ALLERGY) 180 MG tablet Take 1 tablet (180 mg total) by mouth daily. 90 tablet 3   Fluticasone-Umeclidin-Vilant (TRELEGY ELLIPTA) 100-62.5-25 MCG/INH AEPB Inhale 1 puff into the lungs daily. 2 each 5   glucose blood (ONETOUCH ULTRA) test strip USE AS DIRECTED 300 strip 0   losartan (COZAAR) 25 MG tablet TAKE 1 TABLET BY MOUTH  DAILY 90 tablet 3   methocarbamol (ROBAXIN) 500 MG tablet Take 500 mg by mouth every 6 (six) hours as needed for muscle spasms.     omeprazole (PRILOSEC) 40 MG capsule TAKE 1 CAPSULE BY MOUTH DAILY 90 capsule 0   ONE TOUCH LANCETS MISC 1 each by Does not apply route 3 (three) times daily. 200 each 5   OVER THE COUNTER MEDICATION Apply 1 application topically daily as needed (neuropathy pain). Hemp oil cream     OVER THE COUNTER MEDICATION Apply 1 application topically daily as needed (pain). Hempvana cream     rosuvastatin (CRESTOR) 40 MG tablet Take 1 tablet (40 mg total) by mouth daily. 90 tablet 3   traMADol (ULTRAM) 50 MG tablet Take 2 tablets (100 mg total) by mouth every 6 (six) hours as needed for moderate pain. 20 tablet 0   traZODone (DESYREL) 100 MG tablet TAKE 1 TABLET BY MOUTH AT  BEDTIME (Patient taking differently: Take 100 mg by mouth at bedtime as needed for sleep.) 90 tablet 2   pregabalin (LYRICA) 100 MG capsule Take 1 capsule (100 mg total) by mouth daily. (Patient taking differently: Take 100 mg by mouth 3 (three) times daily.) 60 capsule 5   No current facility-administered medications for this  visit.    REVIEW OF SYSTEMS:   [  X] denotes positive finding, [ ] denotes negative finding Cardiac  Comments:  Chest pain or chest pressure:    Shortness of breath upon exertion:    Short of breath when lying flat:    Irregular heart rhythm:        Vascular    Pain in calf, thigh, or hip brought on by ambulation: x   Pain in feet at night that wakes you up from your sleep:     Blood clot in your veins:    Leg swelling:         Pulmonary    Oxygen at home:    Productive cough:     Wheezing:         Neurologic    Sudden weakness in arms or legs:     Sudden numbness in arms or legs:     Sudden onset of difficulty speaking or slurred speech:    Temporary loss of vision in one eye:     Problems with dizziness:         Gastrointestinal    Blood in stool:     Vomited blood:         Genitourinary    Burning when urinating:     Blood in urine:        Psychiatric    Major depression:         Hematologic    Bleeding problems:    Problems with blood clotting too easily:        Skin    Rashes or ulcers:        Constitutional    Fever or chills:      PHYSICAL EXAM:   Vitals:   03/26/21 1039  BP: (!) 150/75  Pulse: (!) 56  Resp: 20  Temp: 98.3 F (36.8 C)  SpO2: 96%  Weight: 206 lb (93.4 kg)  Height: 5' 8.5" (1.74 m)    GENERAL: The patient is a well-nourished male, in no acute distress. The vital signs are documented above. CARDIAC: There is a regular rate and rhythm.  VASCULAR: Nonpalpable right pedal pulse.  2+ right femoral pulse PULMONARY: Non-labored respirations ABDOMEN: Soft and non-tender with normal pitched bowel sounds.  NEUROLOGIC: No focal weakness or paresthesias are detected. SKIN: There are no ulcers or rashes noted. PSYCHIATRIC: The patient has a normal affect.  STUDIES:   I have reviewed the following: +-------+-----------+-----------+------------+------------+   ABI/TBI Today's ABI Today's TBI Previous ABI Previous TBI    +-------+-----------+-----------+------------+------------+   Right   0.64        0.20        0.45         0              +-------+-----------+-----------+------------+------------+   Left    [place]          0.42        0.67         0.30           +-------+-----------+-----------+------------+------------+   Right toe pressure:  32 Left toe pressure:  68  MEDICAL ISSUES:   The patient is still having significant limitations in his right leg.  We discussed proceeding with revascularization of the occluded right superficial femoral artery.  I would prefer to do this percutaneously, given he had a femoral endarterectomy 3 months ago.  This will most likely need to be through an antegrade approach, as I do not think I can get over his aortobifemoral bypass graft, and  coming from the arm likely will not give me enough length.  He wants to get this done as soon as possible.  I have him scheduled for next Tuesday.  He understands that there is a possibility that this may not be successful and he may require femoral-popliteal bypass graft.    Leia Alf, MD, FACS [facility] [address] [phone] Pager [phone]

## 2021-03-26 NOTE — Progress Notes (Signed)
Vascular and Vein Specialist of [place]  Patient name: Joshua [name] MRN: [id number] DOB: February 04, 1956 Sex: male   REASON FOR VISIT:    Follow up  HISOTRY OF PRESENT ILLNESS:    Joshua [name] is a 66 y.o. male who is status post right iliofemoral endarterectomy with Dacron patch angioplasty on 12/28/2020.  He has a history of an aortobifemoral bypass graft performed at [facility] many years ago.  This was done for a right heel wound.  He did have drainage and swelling after his surgery.  He is still having issues with leg swelling.  His biggest complaint is still with claudication and numbness and burning in his feet, which is his baseline neuropathy.  Patient has a history of coronary artery disease status post PCI.  He is a diabetic with diabetic neuropathy.  He is a current smoker.  He is on a statin for hypercholesterolemia. PAST MEDICAL HISTORY:   Past Medical History:  Diagnosis Date   AC (acromioclavicular) joint bone spurs, unspecified laterality 11/25/2016   Arthritis    "back" (05/16/2015)   CAD in native artery, occluded Lcx and rotational atherectomy to LAD with DES 05/17/15 05/18/2015   Cavitary lesion of lung 02/25/2018   Chronic back pain    "all my back"   Chronic bronchitis (HCC)    COPD (chronic obstructive pulmonary disease) ([place])    Diabetes mellitus without complication (HCC)    GERD (gastroesophageal reflux disease)    H/O blood clots    "had them in my back; put filter in before one of my neck ORs"   History of gout    Hyperlipidemia    Hypertension    Neuromuscular disorder (HCC)    PAD (peripheral artery disease) (HCC)    Phlebitis    Positive TB test    Seasonal allergies    Shortness of breath    Spinal disease      FAMILY HISTORY:   Family History  Problem Relation Age of Onset   Stomach cancer Mother        Deceased, 21s   Hypertension Mother    Hypertension Father    Stroke Father     Heart attack Father        Deceased, 29   Aneurysm Brother    Healthy Daughter    Healthy Maternal Grandmother    Leukemia Grandchild    Diabetes Neg Hx    Early death Neg Hx    Colon cancer Neg Hx    Pancreatic cancer Neg Hx     SOCIAL HISTORY:   Social History   Tobacco Use   Smoking status: Former    Packs/day: 0.50    Years: 49.00    Pack years: 24.50    Types: Cigarettes    Quit date: 02/09/2021    Years since quitting: 0.1   Smokeless tobacco: Never   Tobacco comments:    half a pack a day   Substance Use Topics   Alcohol use: Yes    Alcohol/week: 0.0 standard drinks    Comment: 2 drinks a week     ALLERGIES:   Allergies  Allergen Reactions   Lisinopril Hives and Itching   Amitriptyline Other (See Comments)    Makes pt feel "weird" and nausea.   Contrast Media [  Iodinated Contrast Media] Hives and Itching    Pt can use if taking benadryl    Ibuprofen Other (See Comments)    Internal bleeding     CURRENT MEDICATIONS:   Current Outpatient Medications  Medication Sig Dispense Refill   acetaminophen (TYLENOL) 500 MG tablet Take 1,000 mg by mouth every 6 (six) hours as needed for mild pain.     albuterol (PROAIR HFA) 108 (90 Base) MCG/ACT inhaler USE 2 PUFFS EVERY 6 HOURS  AS NEEDED FOR WHEEZING OR  SHORTNESS OF BREATH (Patient taking differently: Inhale 2 puffs into the lungs every 6 (six) hours as needed for wheezing or shortness of breath.) 34 g 3   albuterol (PROVENTIL) (2.5 MG/3ML) 0.083% nebulizer solution Take 3 mLs (2.5 mg total) by nebulization every 6 (six) hours as needed for wheezing or shortness of breath. 75 mL 5   aspirin EC 81 MG tablet Take 81 mg by mouth daily.     Blood Glucose Monitoring Suppl (ONETOUCH VERIO) w/Device KIT 1 Device by Does not apply route once. 1 kit 0   Camphor (JOINTFLEX EX) Apply 1 application topically daily as needed (pain).     carvedilol (COREG) 12.5 MG tablet TAKE 1 TABLET BY MOUTH  TWICE DAILY WITH MEALS 60 tablet  0   colchicine 0.6 MG tablet TAKE 2 TABLETS BY MOUTH AS  DIRECTED THEN 1 TABLET  AFTER 1 HOUR. CAN TAKE 1  TABLET TWICE DAILY  THEREAFTER. (Patient taking differently: Take 0.6 mg by mouth 2 (two) times daily as needed (gout flare).) 60 tablet 11   DULoxetine (CYMBALTA) 60 MG capsule Take 1 capsule (60 mg total) by mouth daily. For Neuropathy 30 capsule 6   ezetimibe (ZETIA) 10 MG tablet Take 1 tablet (10 mg total) by mouth daily. 90 tablet 3   fexofenadine (ALLEGRA ALLERGY) 180 MG tablet Take 1 tablet (180 mg total) by mouth daily. 90 tablet 3   Fluticasone-Umeclidin-Vilant (TRELEGY ELLIPTA) 100-62.5-25 MCG/INH AEPB Inhale 1 puff into the lungs daily. 2 each 5   glucose blood (ONETOUCH ULTRA) test strip USE AS DIRECTED 300 strip 0   losartan (COZAAR) 25 MG tablet TAKE 1 TABLET BY MOUTH  DAILY 90 tablet 3   methocarbamol (ROBAXIN) 500 MG tablet Take 500 mg by mouth every 6 (six) hours as needed for muscle spasms.     omeprazole (PRILOSEC) 40 MG capsule TAKE 1 CAPSULE BY MOUTH DAILY 90 capsule 0   ONE TOUCH LANCETS MISC 1 each by Does not apply route 3 (three) times daily. 200 each 5   OVER THE COUNTER MEDICATION Apply 1 application topically daily as needed (neuropathy pain). Hemp oil cream     OVER THE COUNTER MEDICATION Apply 1 application topically daily as needed (pain). Hempvana cream     rosuvastatin (CRESTOR) 40 MG tablet Take 1 tablet (40 mg total) by mouth daily. 90 tablet 3   traMADol (ULTRAM) 50 MG tablet Take 2 tablets (100 mg total) by mouth every 6 (six) hours as needed for moderate pain. 20 tablet 0   traZODone (DESYREL) 100 MG tablet TAKE 1 TABLET BY MOUTH AT  BEDTIME (Patient taking differently: Take 100 mg by mouth at bedtime as needed for sleep.) 90 tablet 2   pregabalin (LYRICA) 100 MG capsule Take 1 capsule (100 mg total) by mouth daily. (Patient taking differently: Take 100 mg by mouth 3 (three) times daily.) 60 capsule 5   No current facility-administered medications for this  visit.    REVIEW OF SYSTEMS:   [  lying flat:    °Irregular heart rhythm:    °    °Vascular    °Pain in calf, thigh, or hip brought on by ambulation: x   °Pain in feet at night that wakes you up from your sleep:     °Blood clot in your veins:    °Leg swelling:     °    °Pulmonary    °Oxygen at home:    °Productive cough:     °Wheezing:     °    °Neurologic    °Sudden weakness in arms or legs:     °Sudden numbness in arms or legs:     °Sudden onset of difficulty speaking or slurred speech:    °Temporary loss of vision in one eye:     °Problems with dizziness:     °    °Gastrointestinal    °Blood in stool:     °Vomited blood:     °    °Genitourinary    °Burning when urinating:     °Blood in urine:    °    °Psychiatric    °Major depression:     °    °Hematologic    °Bleeding problems:    °Problems with blood clotting too easily:    °    °Skin    °Rashes or ulcers:    °    °Constitutional    °Fever or chills:    ° ° °PHYSICAL EXAM:  ° °Vitals:  ° 03/26/21 1039  °BP: (!) 150/75  °Pulse: (!) 56  °Resp: 20  °Temp: 98.3 °F (36.8 °C)  °SpO2: 96%  °Weight: 206 lb (93.4 kg)  °Height: 5' 8.5" (1.74 m)  ° ° °GENERAL: The patient is a well-nourished male, in no acute distress. The vital signs are documented above. °CARDIAC: There is a regular rate and rhythm.  °VASCULAR: Nonpalpable right pedal pulse.  2+ right femoral pulse °PULMONARY: Non-labored respirations °ABDOMEN: Soft and non-tender with normal pitched bowel sounds.  °NEUROLOGIC: No focal weakness or paresthesias are detected. °SKIN: There are no ulcers or rashes noted. °PSYCHIATRIC: The patient has a normal affect. ° °STUDIES:  ° °I have reviewed the following: °+-------+-----------+-----------+------------+------------+  ° ABI/TBI Today's ABI Today's TBI Previous ABI Previous TBI    °+-------+-----------+-----------+------------+------------+  ° Right   0.64        0.20        0.45         0              °+-------+-----------+-----------+------------+------------+  ° Left              0.42        0.67         0.30           °+-------+-----------+-----------+------------+------------+  ° °Right toe pressure:  32 °Left toe pressure:  68 ° °MEDICAL ISSUES:  ° °The patient is still having significant limitations in his right leg.  We discussed proceeding with revascularization of the occluded right superficial femoral artery.  I would prefer to do this percutaneously, given he had a femoral endarterectomy 3 months ago.  This will most likely need to be through an antegrade approach, as I do not think I can get over his aortobifemoral bypass graft, and coming from the arm likely will not give me enough length.  He wants to get this done as soon as possible.  I have him scheduled for next Tuesday.  He understands   that there is a possibility that this may not be successful and he may require femoral-popliteal bypass graft. ° ° ° °Wells Stella Encarnacion, IV, MD, FACS °[facility] [place] °Tel [phone] °Pager [phone]  °

## 2021-03-27 DIAGNOSIS — E559 Vitamin D deficiency, unspecified: Secondary | ICD-10-CM | POA: Diagnosis not present

## 2021-03-27 DIAGNOSIS — J018 Other acute sinusitis: Secondary | ICD-10-CM | POA: Diagnosis not present

## 2021-03-27 DIAGNOSIS — I10 Essential (primary) hypertension: Secondary | ICD-10-CM | POA: Diagnosis not present

## 2021-03-27 DIAGNOSIS — E7800 Pure hypercholesterolemia, unspecified: Secondary | ICD-10-CM | POA: Diagnosis not present

## 2021-04-01 ENCOUNTER — Other Ambulatory Visit: Payer: Self-pay | Admitting: Family Medicine

## 2021-04-01 DIAGNOSIS — E1159 Type 2 diabetes mellitus with other circulatory complications: Secondary | ICD-10-CM

## 2021-04-02 NOTE — Telephone Encounter (Signed)
Requested medication (s) are due for refill today Yes   Last ordered 02/23/21 #60 tabs  Requested medication (s) are on the active medication list Yes  Future visit scheduled No. Attempted to reach patient. No answer. Routing to physician for review. LOV 12/07/20   Requested Prescriptions  Pending Prescriptions Disp Refills   carvedilol (COREG) 12.5 MG tablet [Pharmacy Med Name: Carvedilol 12.5 MG Oral Tablet] 120 tablet 5    Sig: TAKE 1 TABLET BY MOUTH  TWICE DAILY WITH MEALS     Cardiovascular:  Beta Blockers Failed - 04/01/2021 11:05 PM      Failed - Last BP in normal range    BP Readings from Last 1 Encounters:  03/26/21 (!) 150/75          Passed - Last Heart Rate in normal range    Pulse Readings from Last 1 Encounters:  03/26/21 (!) 48          Passed - Valid encounter within last 6 months    Recent Outpatient Visits           3 months ago Type 2 diabetes mellitus with peripheral neuropathy ([place])   [place], Charlane Ferretti, MD   9 years ago HTN (hypertension)   [facility], Fenton Malling, MD

## 2021-04-03 ENCOUNTER — Other Ambulatory Visit: Payer: Self-pay

## 2021-04-03 ENCOUNTER — Encounter (HOSPITAL_COMMUNITY)
Admission: RE | Disposition: A | Discharge status: Home / Self Care | Payer: Self-pay | Source: Home / Self Care | Attending: Surgery

## 2021-04-03 ENCOUNTER — Ambulatory Visit (HOSPITAL_COMMUNITY)
Admission: RE | Admit: 2021-04-03 | Discharge: 2021-04-03 | Disposition: A | Discharge status: Home / Self Care | Payer: Medicare Other | Source: Home / Self Care | Attending: Surgery | Admitting: Surgery

## 2021-04-03 DIAGNOSIS — E1151 Type 2 diabetes mellitus with diabetic peripheral angiopathy without gangrene: Secondary | ICD-10-CM | POA: Insufficient documentation

## 2021-04-03 DIAGNOSIS — K219 Gastro-esophageal reflux disease without esophagitis: Secondary | ICD-10-CM | POA: Diagnosis not present

## 2021-04-03 DIAGNOSIS — Z79899 Other long term (current) drug therapy: Secondary | ICD-10-CM | POA: Diagnosis not present

## 2021-04-03 DIAGNOSIS — Z886 Allergy status to analgesic agent status: Secondary | ICD-10-CM | POA: Diagnosis not present

## 2021-04-03 DIAGNOSIS — Z91041 Radiographic dye allergy status: Secondary | ICD-10-CM | POA: Diagnosis not present

## 2021-04-03 DIAGNOSIS — F17210 Nicotine dependence, cigarettes, uncomplicated: Secondary | ICD-10-CM | POA: Insufficient documentation

## 2021-04-03 DIAGNOSIS — E7800 Pure hypercholesterolemia, unspecified: Secondary | ICD-10-CM | POA: Insufficient documentation

## 2021-04-03 DIAGNOSIS — M109 Gout, unspecified: Secondary | ICD-10-CM | POA: Diagnosis not present

## 2021-04-03 DIAGNOSIS — I2510 Atherosclerotic heart disease of native coronary artery without angina pectoris: Secondary | ICD-10-CM | POA: Diagnosis not present

## 2021-04-03 DIAGNOSIS — E785 Hyperlipidemia, unspecified: Secondary | ICD-10-CM | POA: Diagnosis not present

## 2021-04-03 DIAGNOSIS — E119 Type 2 diabetes mellitus without complications: Secondary | ICD-10-CM | POA: Diagnosis not present

## 2021-04-03 DIAGNOSIS — M1990 Unspecified osteoarthritis, unspecified site: Secondary | ICD-10-CM | POA: Diagnosis not present

## 2021-04-03 DIAGNOSIS — Z806 Family history of leukemia: Secondary | ICD-10-CM | POA: Diagnosis not present

## 2021-04-03 DIAGNOSIS — I739 Peripheral vascular disease, unspecified: Secondary | ICD-10-CM | POA: Diagnosis not present

## 2021-04-03 DIAGNOSIS — I70221 Atherosclerosis of native arteries of extremities with rest pain, right leg: Secondary | ICD-10-CM | POA: Insufficient documentation

## 2021-04-03 DIAGNOSIS — I70211 Atherosclerosis of native arteries of extremities with intermittent claudication, right leg: Secondary | ICD-10-CM | POA: Diagnosis not present

## 2021-04-03 DIAGNOSIS — M549 Dorsalgia, unspecified: Secondary | ICD-10-CM | POA: Diagnosis not present

## 2021-04-03 DIAGNOSIS — Z8672 Personal history of thrombophlebitis: Secondary | ICD-10-CM | POA: Diagnosis not present

## 2021-04-03 DIAGNOSIS — Z87891 Personal history of nicotine dependence: Secondary | ICD-10-CM | POA: Diagnosis not present

## 2021-04-03 DIAGNOSIS — Z955 Presence of coronary angioplasty implant and graft: Secondary | ICD-10-CM | POA: Diagnosis not present

## 2021-04-03 DIAGNOSIS — Z95828 Presence of other vascular implants and grafts: Secondary | ICD-10-CM | POA: Diagnosis not present

## 2021-04-03 DIAGNOSIS — Z8249 Family history of ischemic heart disease and other diseases of the circulatory system: Secondary | ICD-10-CM | POA: Diagnosis not present

## 2021-04-03 DIAGNOSIS — Z888 Allergy status to other drugs, medicaments and biological substances status: Secondary | ICD-10-CM | POA: Diagnosis not present

## 2021-04-03 DIAGNOSIS — Z7982 Long term (current) use of aspirin: Secondary | ICD-10-CM | POA: Diagnosis not present

## 2021-04-03 DIAGNOSIS — Z20822 Contact with and (suspected) exposure to covid-19: Secondary | ICD-10-CM | POA: Insufficient documentation

## 2021-04-03 DIAGNOSIS — Z823 Family history of stroke: Secondary | ICD-10-CM | POA: Diagnosis not present

## 2021-04-03 DIAGNOSIS — J449 Chronic obstructive pulmonary disease, unspecified: Secondary | ICD-10-CM | POA: Diagnosis not present

## 2021-04-03 DIAGNOSIS — G8929 Other chronic pain: Secondary | ICD-10-CM | POA: Diagnosis not present

## 2021-04-03 DIAGNOSIS — I10 Essential (primary) hypertension: Secondary | ICD-10-CM | POA: Diagnosis not present

## 2021-04-03 DIAGNOSIS — E1140 Type 2 diabetes mellitus with diabetic neuropathy, unspecified: Secondary | ICD-10-CM | POA: Insufficient documentation

## 2021-04-03 DIAGNOSIS — Z95820 Peripheral vascular angioplasty status with implants and grafts: Secondary | ICD-10-CM | POA: Diagnosis not present

## 2021-04-03 DIAGNOSIS — Z800 Family history of malignant neoplasm of digestive organs: Secondary | ICD-10-CM | POA: Diagnosis not present

## 2021-04-03 HISTORY — PX: ABDOMINAL AORTOGRAM W/LOWER EXTREMITY: CATH118223

## 2021-04-03 HISTORY — PX: PERIPHERAL VASCULAR INTERVENTION: CATH118257

## 2021-04-03 LAB — COMPREHENSIVE METABOLIC PANEL
ALT: 19 U/L (ref 0–44)
AST: 26 U/L (ref 15–41)
Albumin: 3.5 g/dL (ref 3.5–5.0)
Alkaline Phosphatase: 76 U/L (ref 38–126)
Anion gap: 12 (ref 5–15)
BUN: 18 mg/dL (ref 8–23)
CO2: 20 mmol/L — ABNORMAL LOW (ref 22–32)
Calcium: 8.8 mg/dL — ABNORMAL LOW (ref 8.9–10.3)
Chloride: 106 mmol/L (ref 98–111)
Creatinine, Ser: 0.93 mg/dL (ref 0.61–1.24)
GFR, Estimated: >60 mL/min (ref >60)
Glucose, Bld: 194 mg/dL — ABNORMAL HIGH (ref 70–99)
Potassium: 4.1 mmol/L (ref 3.5–5.1)
Sodium: 138 mmol/L (ref 135–145)
Total Bilirubin: 0.4 mg/dL (ref 0.3–1.2)
Total Protein: 6.4 g/dL — ABNORMAL LOW (ref 6.5–8.1)

## 2021-04-03 LAB — POCT I-STAT, CHEM 8
BUN: 22 mg/dL (ref 8–23)
Calcium, Ion: 1.19 mmol/L (ref 1.15–1.40)
Chloride: 104 mmol/L (ref 98–111)
Creatinine, Ser: 0.9 mg/dL (ref 0.61–1.24)
Glucose, Bld: 84 mg/dL (ref 70–99)
HCT: 46 % (ref 39.0–52.0)
Hemoglobin: 15.6 g/dL (ref 13.0–17.0)
Potassium: 4.8 mmol/L (ref 3.5–5.1)
Sodium: 139 mmol/L (ref 135–145)
TCO2: 29 mmol/L (ref 22–32)

## 2021-04-03 LAB — CBC
HCT: 46 % (ref 39.0–52.0)
Hemoglobin: 15.4 g/dL (ref 13.0–17.0)
MCH: 31.6 pg (ref 26.0–34.0)
MCHC: 33.5 g/dL (ref 30.0–36.0)
MCV: 94.5 fL (ref 80.0–100.0)
Platelets: 145 10*3/uL — ABNORMAL LOW (ref 150–400)
RBC: 4.87 MIL/uL (ref 4.22–5.81)
RDW: 15.6 % — ABNORMAL HIGH (ref 11.5–15.5)
WBC: 8.2 10*3/uL (ref 4.0–10.5)
nRBC: 0 % (ref 0.0–0.2)

## 2021-04-03 LAB — APTT: aPTT: 29 seconds (ref 24–36)

## 2021-04-03 LAB — TYPE AND SCREEN
ABO/RH(D): A POS
Antibody Screen: NEGATIVE

## 2021-04-03 LAB — LIPID PANEL
Cholesterol: 145 mg/dL (ref 0–200)
HDL: 74 mg/dL (ref >40)
LDL Cholesterol: 57 mg/dL (ref 0–99)
Total CHOL/HDL Ratio: 2 RATIO
Triglycerides: 72 mg/dL (ref <150)
VLDL: 14 mg/dL (ref 0–40)

## 2021-04-03 LAB — RESP PANEL BY RT-PCR (FLU A&B, COVID) ARPGX2
Influenza A by PCR: NEGATIVE
Influenza B by PCR: NEGATIVE
SARS Coronavirus 2 by RT PCR: NEGATIVE

## 2021-04-03 LAB — PROTIME-INR
INR: 0.9 (ref 0.8–1.2)
Prothrombin Time: 11.6 seconds (ref 11.4–15.2)

## 2021-04-03 LAB — BLOOD PRODUCT ORDER (VERBAL) VERIFICATION

## 2021-04-03 SURGERY — ABDOMINAL AORTOGRAM W/LOWER EXTREMITY
Anesthesia: LOCAL | Laterality: Right

## 2021-04-03 MED ORDER — FENTANYL CITRATE (PF) 100 MCG/2ML IJ SOLN
INTRAMUSCULAR | Status: DC | PRN
  Administered 2021-04-03: 25 ug via INTRAVENOUS
  Administered 2021-04-03: 50 ug via INTRAVENOUS

## 2021-04-03 MED ORDER — LIDOCAINE HCL (PF) 1 % IJ SOLN
INTRAMUSCULAR | Status: DC | PRN
  Administered 2021-04-03: 15 mL via INTRADERMAL

## 2021-04-03 MED ORDER — SODIUM CHLORIDE 0.9 % IV SOLN: INTRAVENOUS | Status: DC

## 2021-04-03 MED ORDER — HYDRALAZINE HCL 20 MG/ML IJ SOLN: 5.0000 mg | INTRAMUSCULAR | Status: DC | PRN

## 2021-04-03 MED ORDER — HEPARIN (PORCINE) IN NACL 1000-0.9 UT/500ML-% IV SOLN
INTRAVENOUS | Status: AC
  Filled 2021-04-03: qty 500

## 2021-04-03 MED ORDER — IODIXANOL 320 MG/ML IV SOLN
INTRAVENOUS | Status: DC | PRN
  Administered 2021-04-03: 25 mL via INTRA_ARTERIAL

## 2021-04-03 MED ORDER — SODIUM CHLORIDE 0.9 % WEIGHT BASED INFUSION: 1.0000 mL/kg/h | INTRAVENOUS | Status: DC

## 2021-04-03 MED ORDER — OXYCODONE HCL 5 MG PO TABS: 5.0000 mg | ORAL_TABLET | ORAL | Status: DC | PRN

## 2021-04-03 MED ORDER — ACETAMINOPHEN 325 MG PO TABS: 650.0000 mg | ORAL_TABLET | ORAL | Status: DC | PRN

## 2021-04-03 MED ORDER — MIDAZOLAM HCL 2 MG/2ML IJ SOLN
INTRAMUSCULAR | Status: AC
  Filled 2021-04-03: qty 2

## 2021-04-03 MED ORDER — LABETALOL HCL 5 MG/ML IV SOLN: 10.0000 mg | INTRAVENOUS | Status: DC | PRN

## 2021-04-03 MED ORDER — DIPHENHYDRAMINE HCL 50 MG/ML IJ SOLN
25.0000 mg | Freq: Once | INTRAMUSCULAR | Status: AC
  Administered 2021-04-03: 25 mg via INTRAVENOUS
  Filled 2021-04-03: qty 1

## 2021-04-03 MED ORDER — HEPARIN (PORCINE) IN NACL 1000-0.9 UT/500ML-% IV SOLN
INTRAVENOUS | Status: DC | PRN
  Administered 2021-04-03 (×2): 500 mL

## 2021-04-03 MED ORDER — LIDOCAINE HCL (PF) 1 % IJ SOLN
INTRAMUSCULAR | Status: AC
  Filled 2021-04-03: qty 30

## 2021-04-03 MED ORDER — SODIUM CHLORIDE 0.9% FLUSH: 3.0000 mL | INTRAVENOUS | Status: DC | PRN

## 2021-04-03 MED ORDER — ONDANSETRON HCL 4 MG/2ML IJ SOLN: 4.0000 mg | Freq: Four times a day (QID) | INTRAMUSCULAR | Status: DC | PRN

## 2021-04-03 MED ORDER — METHYLPREDNISOLONE SODIUM SUCC 125 MG IJ SOLR
125.0000 mg | Freq: Once | INTRAMUSCULAR | Status: AC
Start: 2021-04-03 — End: 2021-04-03
  Administered 2021-04-03: 125 mg via INTRAVENOUS
  Filled 2021-04-03: qty 2

## 2021-04-03 MED ORDER — MIDAZOLAM HCL 2 MG/2ML IJ SOLN
INTRAMUSCULAR | Status: DC | PRN
  Administered 2021-04-03: 1 mg via INTRAVENOUS
  Administered 2021-04-03: 2 mg via INTRAVENOUS

## 2021-04-03 MED ORDER — SODIUM CHLORIDE 0.9% FLUSH: 3.0000 mL | Freq: Two times a day (BID) | INTRAVENOUS | Status: DC

## 2021-04-03 MED ORDER — MORPHINE SULFATE (PF) 2 MG/ML IV SOLN: 2.0000 mg | INTRAVENOUS | Status: DC | PRN

## 2021-04-03 MED ORDER — SODIUM CHLORIDE 0.9 % IV SOLN: 250.0000 mL | INTRAVENOUS | Status: DC | PRN

## 2021-04-03 MED ORDER — FENTANYL CITRATE (PF) 100 MCG/2ML IJ SOLN
INTRAMUSCULAR | Status: AC
  Filled 2021-04-03: qty 2

## 2021-04-03 SURGICAL SUPPLY — 13 items
CATH ANGIO 5F BER2 65CM (CATHETERS) ×1 IMPLANT
CATH QUICKCROSS SUPP .035X90CM (MICROCATHETER) ×1 IMPLANT
GLIDEWIRE ADV .035X260CM (WIRE) ×1 IMPLANT
GLIDEWIRE NITREX 0.018X80X5 (WIRE) ×1
GUIDEWIRE NITREX 0.018X80X5 (WIRE) IMPLANT
KIT MICROPUNCTURE NIT STIFF (SHEATH) ×2 IMPLANT
KIT PV (KITS) ×3 IMPLANT
SHEATH PINNACLE R/O II 5F 6CM (SHEATH) ×1 IMPLANT
SHEATH PROBE COVER 6X72 (BAG) ×2 IMPLANT
STOPCOCK MORSE 400PSI 3WAY (MISCELLANEOUS) ×1 IMPLANT
TRANSDUCER W/STOPCOCK (MISCELLANEOUS) ×3 IMPLANT
TRAY PV CATH (CUSTOM PROCEDURE TRAY) ×3 IMPLANT
TUBING CIL FLEX 10 FLL-RA (TUBING) ×1 IMPLANT

## 2021-04-03 NOTE — Op Note (Signed)
° ° °  Patient name: Joshua [name] MRN: [id number] DOB: [birth year] Sex: male  04/03/2021 Pre-operative Diagnosis: Right leg rest pain Post-operative diagnosis:  Same Surgeon:  [name] Procedure Performed:  1.  Ultrasound-guided access, right femoral artery (antegrade)  2.  Right lower extremity runoff  3.  Failed angioplasty right superficial femoral artery  4.  Conscious sedation, 42 minutes   Indications: The patient has a history of an aortobifemoral bypass graft.  He is also undergone a right femoral endarterectomy.  He is having persistent leg pain.  He comes in today for an attempt at percutaneous revascularization  Procedure:  The patient was identified in the holding area and taken to room 8.  The patient was then placed supine on the table and prepped and draped in the usual sterile fashion.  A time out was called.  Conscious sedation was administered with the use of IV fentanyl and Versed under continuous physician and nurse monitoring.  Heart rate, blood pressure, and oxygen saturation were continuously monitored.  Total sedation time was 42 minutes.  Ultrasound was used to evaluate the right common femoral artery.  It was patent .  A digital ultrasound image was acquired.  A micropuncture needle was used to access the right common femoral artery under ultrasound guidance.  In a antegrade fashion.  An 018 wire was advanced without resistance and a micropuncture sheath was placed.  I performed angiography via the micropuncture sheath in the profundofemoral artery.  Findings:     Right Lower Extremity: The right common femoral artery is widely patent.  The profundofemoral artery is widely patent.  The superficial femoral artery is occluded with a 1 cm stump.  There is reconstitution of the above-knee popliteal artery.  The anterior tibial is the dominant runoff.  The posterior tibial and peroneal artery are small but patent   Intervention: After the above images were acquired the  decision was made to proceed with intervention.  A Glidewire advantage was advanced into the profundofemoral artery and the micropuncture sheath was exchanged out for a 5 [place] sheath.  I then used a Berenstein 2 catheter to cannulate the superficial femoral artery.  I then tried to advance the Glidewire advantage through the occlusion.  I was unable to get through the proximal cap.  I then switched out for a quick cross catheter and had similar results.  At this point time, I felt his best option was surgical bypass therefore the procedure was terminated and the sheath was removed and manual pressure was held for hemostasis  Impression:  #1  Unsuccessful attempt at superficial femoral artery recanalization via an antegrade approach  #2  The patient will be considered for right femoral to above-knee popliteal bypass graft     V. [name], M.D., [facility] [place] Office: [phone] Pager:  [phone]

## 2021-04-03 NOTE — Interval H&P Note (Signed)
History and Physical Interval Note:  04/03/2021 8:32 AM  Joshua [name]  has presented today for surgery, with the diagnosis of claudication.  The various methods of treatment have been discussed with the patient and family. After consideration of risks, benefits and other options for treatment, the patient has consented to  Procedure(s): ABDOMINAL AORTOGRAM W/LOWER EXTREMITY (N/A) as a surgical intervention.  The patient's history has been reviewed, patient examined, no change in status, stable for surgery.  I have reviewed the patient's chart and labs.  Questions were answered to the patient's satisfaction.     [name]

## 2021-04-03 NOTE — Progress Notes (Signed)
Pt ambulated without difficulty or bleeding.   Discharged home with wife who will drive and stay with pt x 24 hrs.  Surgical nurse coming to talk with pt and wife re tomorrow's surgery.  They will obtain necessary tests for tomorrow and do pre-op teaching.

## 2021-04-03 NOTE — Discharge Instructions (Signed)
Femoral Site Care This sheet gives you information about how to care for yourself after your procedure. Your health care provider may also give you more specific instructions. If you have problems or questions, contact your health care provider. What can I expect after the procedure? After the procedure, it is common to have: Bruising that usually fades within 1-2 weeks. Tenderness at the site. Follow these instructions at home: Wound care May remove bandage after 24 hours. Do not take baths, swim, or use a hot tub for 5 days. You may shower 24-48 hours after the procedure. Gently wash the site with plain soap and water. Pat the area dry with a clean towel. Do not rub the site. This may cause bleeding. Do not apply powder or lotion to the site. Keep the site clean and dry. Check your femoral site every day for signs of infection. Check for: Redness, swelling, or pain. Fluid or blood. Warmth. Pus or a bad smell. Activity For the first 2-3 days after your procedure, or as long as directed: Avoid climbing stairs as much as possible. Do not squat. Do not lift, push or pull anything that is heavier than 10 lb for 5 days. Rest as directed. Avoid sitting for a long time without moving. Get up to take short walks every 1-2 hours. Do not drive for 24 hours. General instructions Take over-the-counter and prescription medicines only as told by your health care provider. Keep all follow-up visits as told by your health care provider. This is important. DRINK PLENTY OF FLUIDS FOR THE NEXT 2-3 DAYS. Contact a health care provider if you have: A fever or chills. You have redness, swelling, or pain around your insertion site. Get help right away if: The catheter insertion area swells very fast. You pass out. You suddenly start to sweat or your skin gets clammy. The catheter insertion area is bleeding, and the bleeding does not stop when you hold steady pressure on the area. The area near or  just beyond the catheter insertion site becomes pale, cool, tingly, or numb. These symptoms may represent a serious problem that is an emergency. Do not wait to see if the symptoms will go away. Get medical help right away. Call your local emergency services (911 in the [place] Do not drive yourself to the hospital. Summary After the procedure, it is common to have bruising that usually fades within 1-2 weeks. Check your femoral site every day for signs of infection. Do not lift, push or pull anything that is heavier than 10 lb for 5 days.  This information is not intended to replace advice given to you by your health care provider. Make sure you discuss any questions you have with your health care provider. Document Revised: 03/17/2017 Document Reviewed: 03/17/2017 Elsevier Patient Education  2020 [employer]vier Inc[employer]  

## 2021-04-03 NOTE — Pre-Procedure Instructions (Signed)
Joshua [name]  04/03/2021     Your procedure is scheduled on April 03, 2021.  Report to [facility] Admitting at 0900 A.M.  Call this number if you have problems the morning of surgery:  [phone]   Remember:  Do not eat or drink after midnight.  You may drink clear liquids until 0600 .  Clear liquids allowed are:                    Water, Juice (non-citric and without pulp - diabetics please choose diet or no sugar options), Carbonated beverages - (diabetics please choose diet or no sugar options), Clear Tea, Black Coffee only (no creamer, milk or cream including half and half), Plain Jell-O only (diabetics please choose diet or no sugar options), Gatorade (diabetics please choose diet or no sugar options), and Plain Popsicles only    Take these medicines the morning of surgery with A SIP OF WATER  Carvedilol (Coreg) Omeprazole (Prilosec) Aspirin Ezetimibe (Zetia) Rosuvastatin (Crestor) Trelegy Ellipta inhaler    Do not wear jewelry  Do not wear lotions, powders, cologne, or deodorant.  Men may shave face and neck.  Do not bring valuables to the hospital.  [facility] is not responsible for any belongings or valuables.  Contacts, dentures or bridgework may not be worn into surgery.  Leave your suitcase in the car.  After surgery it may be brought to your room.  For patients admitted to the hospital, discharge time will be determined by your treatment team.  Patients discharged the day of surgery will not be allowed to drive home.    [place]- Preparing For Surgery  Before surgery, you can play an important role. Because skin is not sterile, your skin needs to be as free of germs as possible. You can reduce the number of germs on your skin by washing with CHG (chlorahexidine gluconate) Soap before surgery.  CHG is an antiseptic cleaner which kills germs and bonds with the skin to continue killing germs even after washing.    Oral Hygiene is also  important to reduce your risk of infection.  Remember - BRUSH YOUR TEETH THE MORNING OF SURGERY WITH YOUR REGULAR TOOTHPASTE  Please do not use if you have an allergy to CHG or antibacterial soaps. If your skin becomes reddened/irritated stop using the CHG.  Do not shave (including legs and underarms) for at least 48 hours prior to first CHG shower. It is OK to shave your face.  Please follow these instructions carefully.   Shower the NIGHT BEFORE SURGERY and the MORNING OF SURGERY with CHG.   If you chose to wash your hair, wash your hair first as usual with your normal shampoo.  After you shampoo, rinse your hair and body thoroughly to remove the shampoo.  Use CHG as you would any other liquid soap. You can apply CHG directly to the skin and wash gently with a scrungie or a clean washcloth.   Apply the CHG Soap to your body ONLY FROM THE NECK DOWN.  Do not use on open wounds or open sores. Avoid contact with your eyes, ears, mouth and genitals (private parts). Wash Face and genitals (private parts)  with your normal soap.  Wash thoroughly, paying special attention to the area where your surgery will be performed.  Thoroughly rinse your body with warm water from the neck down.  DO NOT shower/wash with your normal soap after using and rinsing off the CHG  Soap.  Pat yourself dry with a CLEAN TOWEL.  Wear CLEAN PAJAMAS to bed the night before surgery, wear comfortable clothes the morning of surgery  Place CLEAN SHEETS on your bed the night of your first shower and DO NOT SLEEP WITH PETS.    Day of Surgery:  Do not apply any deodorants/lotions.  Please wear clean clothes to the hospital/surgery center.   Remember to brush your teeth WITH YOUR REGULAR TOOTHPASTE.

## 2021-04-04 ENCOUNTER — Encounter (HOSPITAL_COMMUNITY)
Admission: RE | Disposition: A | Discharge status: Home / Self Care | Payer: Self-pay | Source: Home / Self Care | Attending: Surgery

## 2021-04-04 ENCOUNTER — Inpatient Hospital Stay (HOSPITAL_COMMUNITY): Payer: Medicare Other | Admitting: Anesthesiology

## 2021-04-04 ENCOUNTER — Other Ambulatory Visit: Payer: Self-pay

## 2021-04-04 ENCOUNTER — Encounter (HOSPITAL_COMMUNITY): Payer: Self-pay | Admitting: Surgery

## 2021-04-04 ENCOUNTER — Inpatient Hospital Stay (HOSPITAL_COMMUNITY)
Admission: RE | Admit: 2021-04-04 | Discharge: 2021-04-06 | DRG: 254 | Disposition: A | Discharge status: Home / Self Care | Payer: Medicare Other | Attending: Surgery | Admitting: Surgery

## 2021-04-04 DIAGNOSIS — Z886 Allergy status to analgesic agent status: Secondary | ICD-10-CM

## 2021-04-04 DIAGNOSIS — I10 Essential (primary) hypertension: Secondary | ICD-10-CM | POA: Diagnosis not present

## 2021-04-04 DIAGNOSIS — M109 Gout, unspecified: Secondary | ICD-10-CM | POA: Diagnosis present

## 2021-04-04 DIAGNOSIS — M549 Dorsalgia, unspecified: Secondary | ICD-10-CM | POA: Diagnosis present

## 2021-04-04 DIAGNOSIS — Z7982 Long term (current) use of aspirin: Secondary | ICD-10-CM

## 2021-04-04 DIAGNOSIS — Z8672 Personal history of thrombophlebitis: Secondary | ICD-10-CM | POA: Diagnosis not present

## 2021-04-04 DIAGNOSIS — Z91041 Radiographic dye allergy status: Secondary | ICD-10-CM

## 2021-04-04 DIAGNOSIS — F17210 Nicotine dependence, cigarettes, uncomplicated: Secondary | ICD-10-CM

## 2021-04-04 DIAGNOSIS — Z800 Family history of malignant neoplasm of digestive organs: Secondary | ICD-10-CM

## 2021-04-04 DIAGNOSIS — E785 Hyperlipidemia, unspecified: Secondary | ICD-10-CM | POA: Diagnosis present

## 2021-04-04 DIAGNOSIS — K219 Gastro-esophageal reflux disease without esophagitis: Secondary | ICD-10-CM | POA: Diagnosis present

## 2021-04-04 DIAGNOSIS — I70211 Atherosclerosis of native arteries of extremities with intermittent claudication, right leg: Secondary | ICD-10-CM | POA: Diagnosis not present

## 2021-04-04 DIAGNOSIS — Z95820 Peripheral vascular angioplasty status with implants and grafts: Secondary | ICD-10-CM | POA: Diagnosis not present

## 2021-04-04 DIAGNOSIS — E1151 Type 2 diabetes mellitus with diabetic peripheral angiopathy without gangrene: Secondary | ICD-10-CM | POA: Diagnosis not present

## 2021-04-04 DIAGNOSIS — I2510 Atherosclerotic heart disease of native coronary artery without angina pectoris: Secondary | ICD-10-CM | POA: Diagnosis not present

## 2021-04-04 DIAGNOSIS — I739 Peripheral vascular disease, unspecified: Secondary | ICD-10-CM | POA: Diagnosis present

## 2021-04-04 DIAGNOSIS — Z806 Family history of leukemia: Secondary | ICD-10-CM

## 2021-04-04 DIAGNOSIS — Z87891 Personal history of nicotine dependence: Secondary | ICD-10-CM | POA: Diagnosis not present

## 2021-04-04 DIAGNOSIS — M1990 Unspecified osteoarthritis, unspecified site: Secondary | ICD-10-CM | POA: Diagnosis present

## 2021-04-04 DIAGNOSIS — J449 Chronic obstructive pulmonary disease, unspecified: Secondary | ICD-10-CM | POA: Diagnosis not present

## 2021-04-04 DIAGNOSIS — Z79899 Other long term (current) drug therapy: Secondary | ICD-10-CM

## 2021-04-04 DIAGNOSIS — Z8249 Family history of ischemic heart disease and other diseases of the circulatory system: Secondary | ICD-10-CM | POA: Diagnosis not present

## 2021-04-04 DIAGNOSIS — Z20822 Contact with and (suspected) exposure to covid-19: Secondary | ICD-10-CM | POA: Diagnosis not present

## 2021-04-04 DIAGNOSIS — Z955 Presence of coronary angioplasty implant and graft: Secondary | ICD-10-CM

## 2021-04-04 DIAGNOSIS — Z888 Allergy status to other drugs, medicaments and biological substances status: Secondary | ICD-10-CM | POA: Diagnosis not present

## 2021-04-04 DIAGNOSIS — Z823 Family history of stroke: Secondary | ICD-10-CM | POA: Diagnosis not present

## 2021-04-04 DIAGNOSIS — Z538 Procedure and treatment not carried out for other reasons: Secondary | ICD-10-CM | POA: Diagnosis not present

## 2021-04-04 DIAGNOSIS — G8929 Other chronic pain: Secondary | ICD-10-CM | POA: Diagnosis present

## 2021-04-04 HISTORY — PX: FEMORAL-POPLITEAL BYPASS GRAFT: SHX937

## 2021-04-04 LAB — SURGICAL PCR SCREEN
MRSA, PCR: NEGATIVE
Staphylococcus aureus: POSITIVE — AB

## 2021-04-04 LAB — POCT ACTIVATED CLOTTING TIME
Activated Clotting Time: 275 seconds
Activated Clotting Time: 239 seconds
Activated Clotting Time: 131 seconds

## 2021-04-04 LAB — GLUCOSE, CAPILLARY
Glucose-Capillary: 88 mg/dL (ref 70–99)
Glucose-Capillary: 85 mg/dL (ref 70–99)
Glucose-Capillary: 136 mg/dL — ABNORMAL HIGH (ref 70–99)
Glucose-Capillary: 160 mg/dL — ABNORMAL HIGH (ref 70–99)

## 2021-04-04 LAB — TYPE AND SCREEN
ABO/RH(D): A POS
Antibody Screen: NEGATIVE

## 2021-04-04 SURGERY — BYPASS GRAFT FEMORAL-POPLITEAL ARTERY
Anesthesia: General | Site: Leg Upper | Laterality: Right

## 2021-04-04 MED ORDER — DEXAMETHASONE SODIUM PHOSPHATE 10 MG/ML IJ SOLN
INTRAMUSCULAR | Status: DC | PRN
Start: 2021-04-04 — End: 2021-04-04
  Administered 2021-04-04: 8 mg via INTRAVENOUS

## 2021-04-04 MED ORDER — ORAL CARE MOUTH RINSE: 15.0000 mL | Freq: Once | OROMUCOSAL | Status: AC

## 2021-04-04 MED ORDER — ASPIRIN EC 81 MG PO TBEC
81.0000 mg | DELAYED_RELEASE_TABLET | Freq: Every day | ORAL | Status: DC
Start: 2021-04-05 — End: 2021-04-06
  Administered 2021-04-05 – 2021-04-06 (×2): 81 mg via ORAL
  Filled 2021-04-04 (×2): qty 1

## 2021-04-04 MED ORDER — PANTOPRAZOLE SODIUM 40 MG PO TBEC
40.0000 mg | DELAYED_RELEASE_TABLET | Freq: Every day | ORAL | Status: DC
  Administered 2021-04-05 – 2021-04-06 (×2): 40 mg via ORAL
  Filled 2021-04-04 (×2): qty 1

## 2021-04-04 MED ORDER — COLCHICINE 0.6 MG PO TABS: 0.6000 mg | ORAL_TABLET | Freq: Two times a day (BID) | ORAL | Status: DC | PRN

## 2021-04-04 MED ORDER — SUGAMMADEX SODIUM 200 MG/2ML IV SOLN
INTRAVENOUS | Status: DC | PRN
  Administered 2021-04-04: 200 mg via INTRAVENOUS

## 2021-04-04 MED ORDER — MAGNESIUM SULFATE 2 GM/50ML IV SOLN: 2.0000 g | Freq: Every day | INTRAVENOUS | Status: DC | PRN

## 2021-04-04 MED ORDER — EPHEDRINE SULFATE-NACL 50-0.9 MG/10ML-% IV SOSY
PREFILLED_SYRINGE | INTRAVENOUS | Status: DC | PRN
Start: 2021-04-04 — End: 2021-04-04
  Administered 2021-04-04: 5 mg via INTRAVENOUS
  Administered 2021-04-04 (×2): 10 mg via INTRAVENOUS
  Administered 2021-04-04 (×2): 5 mg via INTRAVENOUS

## 2021-04-04 MED ORDER — SENNOSIDES-DOCUSATE SODIUM 8.6-50 MG PO TABS
1.0000 | ORAL_TABLET | Freq: Every evening | ORAL | Status: DC | PRN
  Administered 2021-04-06: 1 via ORAL
  Filled 2021-04-04: qty 1

## 2021-04-04 MED ORDER — SODIUM CHLORIDE 0.9 % IV SOLN: 500.0000 mL | Freq: Once | INTRAVENOUS | Status: DC | PRN

## 2021-04-04 MED ORDER — PROTAMINE SULFATE 10 MG/ML IV SOLN
INTRAVENOUS | Status: AC
  Filled 2021-04-04: qty 5

## 2021-04-04 MED ORDER — FLUTICASONE FUROATE-VILANTEROL 200-25 MCG/ACT IN AEPB
1.0000 | INHALATION_SPRAY | Freq: Every day | RESPIRATORY_TRACT | Status: DC
  Filled 2021-04-04: qty 28

## 2021-04-04 MED ORDER — LIDOCAINE 2% (20 MG/ML) 5 ML SYRINGE
INTRAMUSCULAR | Status: AC
  Filled 2021-04-04: qty 15

## 2021-04-04 MED ORDER — CEFAZOLIN SODIUM-DEXTROSE 2-4 GM/100ML-% IV SOLN
2.0000 g | Freq: Three times a day (TID) | INTRAVENOUS | Status: AC
  Administered 2021-04-04 – 2021-04-05 (×2): 2 g via INTRAVENOUS

## 2021-04-04 MED ORDER — ONDANSETRON HCL 4 MG/2ML IJ SOLN
INTRAMUSCULAR | Status: DC | PRN
  Administered 2021-04-04: 4 mg via INTRAVENOUS

## 2021-04-04 MED ORDER — ALBUTEROL SULFATE HFA 108 (90 BASE) MCG/ACT IN AERS
INHALATION_SPRAY | RESPIRATORY_TRACT | Status: AC
  Filled 2021-04-04: qty 6.7

## 2021-04-04 MED ORDER — DEXMEDETOMIDINE (PRECEDEX) IN NS 20 MCG/5ML (4 MCG/ML) IV SYRINGE
PREFILLED_SYRINGE | INTRAVENOUS | Status: AC
  Filled 2021-04-04: qty 5

## 2021-04-04 MED ORDER — LACTATED RINGERS IV SOLN: INTRAVENOUS | Status: DC

## 2021-04-04 MED ORDER — MAGNESIUM CITRATE PO SOLN: 1.0000 | Freq: Once | ORAL | Status: DC | PRN

## 2021-04-04 MED ORDER — FLUTICASONE-UMECLIDIN-VILANT 100-62.5-25 MCG/ACT IN AEPB: 1.0000 | INHALATION_SPRAY | Freq: Every day | RESPIRATORY_TRACT | Status: DC

## 2021-04-04 MED ORDER — ESMOLOL HCL 100 MG/10ML IV SOLN
INTRAVENOUS | Status: DC | PRN
  Administered 2021-04-04: 20 mg via INTRAVENOUS

## 2021-04-04 MED ORDER — DOCUSATE SODIUM 100 MG PO CAPS
100.0000 mg | ORAL_CAPSULE | Freq: Every day | ORAL | Status: DC
  Administered 2021-04-05 – 2021-04-06 (×2): 100 mg via ORAL
  Filled 2021-04-04 (×2): qty 1

## 2021-04-04 MED ORDER — SODIUM CHLORIDE 0.9 % IV SOLN: INTRAVENOUS | Status: DC

## 2021-04-04 MED ORDER — CEFAZOLIN SODIUM-DEXTROSE 2-4 GM/100ML-% IV SOLN
2.0000 g | INTRAVENOUS | Status: AC
  Administered 2021-04-04: 2 g via INTRAVENOUS
  Filled 2021-04-04: qty 100

## 2021-04-04 MED ORDER — FENTANYL CITRATE (PF) 250 MCG/5ML IJ SOLN
INTRAMUSCULAR | Status: AC
  Filled 2021-04-04: qty 5

## 2021-04-04 MED ORDER — POTASSIUM CHLORIDE CRYS ER 20 MEQ PO TBCR: 20.0000 meq | EXTENDED_RELEASE_TABLET | Freq: Every day | ORAL | Status: DC | PRN

## 2021-04-04 MED ORDER — EZETIMIBE 10 MG PO TABS
10.0000 mg | ORAL_TABLET | Freq: Every day | ORAL | Status: DC
  Administered 2021-04-05 – 2021-04-06 (×2): 10 mg via ORAL
  Filled 2021-04-04 (×2): qty 1

## 2021-04-04 MED ORDER — OXYCODONE HCL 5 MG PO TABS
5.0000 mg | ORAL_TABLET | ORAL | Status: DC | PRN
  Administered 2021-04-04: 5 mg via ORAL
  Administered 2021-04-05 (×2): 10 mg via ORAL
  Administered 2021-04-05: 5 mg via ORAL
  Administered 2021-04-05 – 2021-04-06 (×3): 10 mg via ORAL
  Filled 2021-04-04: qty 2
  Filled 2021-04-04: qty 1
  Filled 2021-04-04: qty 2
  Filled 2021-04-04: qty 1
  Filled 2021-04-04 (×3): qty 2

## 2021-04-04 MED ORDER — EPHEDRINE 5 MG/ML INJ
INTRAVENOUS | Status: AC
  Filled 2021-04-04: qty 15

## 2021-04-04 MED ORDER — MUPIROCIN 2 % EX OINT
1.0000 "application " | TOPICAL_OINTMENT | Freq: Two times a day (BID) | CUTANEOUS | Status: DC
  Filled 2021-04-04: qty 22

## 2021-04-04 MED ORDER — DULOXETINE HCL 60 MG PO CPEP
60.0000 mg | ORAL_CAPSULE | Freq: Every day | ORAL | Status: DC
Start: 2021-04-04 — End: 2021-04-04

## 2021-04-04 MED ORDER — PREGABALIN 100 MG PO CAPS
100.0000 mg | ORAL_CAPSULE | Freq: Three times a day (TID) | ORAL | Status: DC
  Administered 2021-04-04 – 2021-04-06 (×5): 100 mg via ORAL
  Filled 2021-04-04 (×5): qty 1

## 2021-04-04 MED ORDER — TRAMADOL HCL 50 MG PO TABS: 100.0000 mg | ORAL_TABLET | Freq: Four times a day (QID) | ORAL | Status: DC | PRN

## 2021-04-04 MED ORDER — LACTATED RINGERS IV SOLN: INTRAVENOUS | Status: DC | PRN

## 2021-04-04 MED ORDER — BISACODYL 5 MG PO TBEC
5.0000 mg | DELAYED_RELEASE_TABLET | Freq: Every day | ORAL | Status: DC | PRN
  Administered 2021-04-05: 5 mg via ORAL
  Filled 2021-04-04: qty 1

## 2021-04-04 MED ORDER — ONDANSETRON HCL 4 MG/2ML IJ SOLN: 4.0000 mg | Freq: Four times a day (QID) | INTRAMUSCULAR | Status: DC | PRN

## 2021-04-04 MED ORDER — CHLORHEXIDINE GLUCONATE 0.12 % MT SOLN: 15.0000 mL | Freq: Once | OROMUCOSAL | Status: AC

## 2021-04-04 MED ORDER — MIDAZOLAM HCL 2 MG/2ML IJ SOLN
INTRAMUSCULAR | Status: DC | PRN
  Administered 2021-04-04: 2 mg via INTRAVENOUS

## 2021-04-04 MED ORDER — HYDROMORPHONE HCL 1 MG/ML IJ SOLN
0.2500 mg | INTRAMUSCULAR | Status: DC | PRN
  Administered 2021-04-04 (×4): 0.5 mg via INTRAVENOUS

## 2021-04-04 MED ORDER — HEPARIN SODIUM (PORCINE) 1000 UNIT/ML IJ SOLN
INTRAMUSCULAR | Status: AC
  Filled 2021-04-04: qty 20

## 2021-04-04 MED ORDER — CHLORHEXIDINE GLUCONATE CLOTH 2 % EX PADS: 6.0000 | MEDICATED_PAD | Freq: Once | CUTANEOUS | Status: DC

## 2021-04-04 MED ORDER — HEPARIN 6000 UNIT IRRIGATION SOLUTION
Status: AC
  Filled 2021-04-04: qty 500

## 2021-04-04 MED ORDER — HEPARIN SODIUM (PORCINE) 1000 UNIT/ML IJ SOLN
INTRAMUSCULAR | Status: AC
  Filled 2021-04-04: qty 10

## 2021-04-04 MED ORDER — ROCURONIUM BROMIDE 10 MG/ML (PF) SYRINGE
PREFILLED_SYRINGE | INTRAVENOUS | Status: AC
  Filled 2021-04-04: qty 10

## 2021-04-04 MED ORDER — METOPROLOL TARTRATE 5 MG/5ML IV SOLN: 2.0000 mg | INTRAVENOUS | Status: DC | PRN

## 2021-04-04 MED ORDER — TRAZODONE HCL 100 MG PO TABS
100.0000 mg | ORAL_TABLET | Freq: Every day | ORAL | Status: DC
  Administered 2021-04-04 – 2021-04-05 (×2): 100 mg via ORAL
  Filled 2021-04-04 (×2): qty 1

## 2021-04-04 MED ORDER — MIDAZOLAM HCL 2 MG/2ML IJ SOLN
INTRAMUSCULAR | Status: AC
  Filled 2021-04-04: qty 2

## 2021-04-04 MED ORDER — ROSUVASTATIN CALCIUM 20 MG PO TABS
40.0000 mg | ORAL_TABLET | Freq: Every day | ORAL | Status: DC
  Administered 2021-04-05 – 2021-04-06 (×2): 40 mg via ORAL
  Filled 2021-04-04 (×2): qty 2

## 2021-04-04 MED ORDER — HEPARIN 6000 UNIT IRRIGATION SOLUTION
Status: DC | PRN
  Administered 2021-04-04: 1

## 2021-04-04 MED ORDER — VASOPRESSIN 20 UNIT/ML IV SOLN
INTRAVENOUS | Status: AC
  Filled 2021-04-04: qty 1

## 2021-04-04 MED ORDER — HEPARIN SODIUM (PORCINE) 5000 UNIT/ML IJ SOLN
5000.0000 [IU] | Freq: Three times a day (TID) | INTRAMUSCULAR | Status: DC
  Administered 2021-04-04 – 2021-04-06 (×5): 5000 [IU] via SUBCUTANEOUS
  Filled 2021-04-04 (×5): qty 1

## 2021-04-04 MED ORDER — ROCURONIUM BROMIDE 10 MG/ML (PF) SYRINGE
PREFILLED_SYRINGE | INTRAVENOUS | Status: DC | PRN
Start: 2021-04-04 — End: 2021-04-04
  Administered 2021-04-04: 30 mg via INTRAVENOUS
  Administered 2021-04-04 (×2): 50 mg via INTRAVENOUS

## 2021-04-04 MED ORDER — DEXAMETHASONE SODIUM PHOSPHATE 10 MG/ML IJ SOLN
INTRAMUSCULAR | Status: AC
  Filled 2021-04-04: qty 2

## 2021-04-04 MED ORDER — LABETALOL HCL 5 MG/ML IV SOLN: 10.0000 mg | INTRAVENOUS | Status: DC | PRN

## 2021-04-04 MED ORDER — METHOCARBAMOL 500 MG PO TABS
500.0000 mg | ORAL_TABLET | Freq: Four times a day (QID) | ORAL | Status: DC | PRN
  Administered 2021-04-04: 500 mg via ORAL
  Filled 2021-04-04: qty 1

## 2021-04-04 MED ORDER — HYDRALAZINE HCL 20 MG/ML IJ SOLN: 5.0000 mg | INTRAMUSCULAR | Status: DC | PRN

## 2021-04-04 MED ORDER — ALBUMIN HUMAN 5 % IV SOLN: INTRAVENOUS | Status: DC | PRN

## 2021-04-04 MED ORDER — ALUM & MAG HYDROXIDE-SIMETH 200-200-20 MG/5ML PO SUSP: 15.0000 mL | ORAL | Status: DC | PRN

## 2021-04-04 MED ORDER — ALBUTEROL SULFATE HFA 108 (90 BASE) MCG/ACT IN AERS
INHALATION_SPRAY | RESPIRATORY_TRACT | Status: DC | PRN
Start: 2021-04-04 — End: 2021-04-04
  Administered 2021-04-04: 4 via RESPIRATORY_TRACT
  Administered 2021-04-04: 2 via RESPIRATORY_TRACT

## 2021-04-04 MED ORDER — HYDROMORPHONE HCL 1 MG/ML IJ SOLN
INTRAMUSCULAR | Status: AC
  Filled 2021-04-04: qty 1

## 2021-04-04 MED ORDER — ALBUTEROL SULFATE (2.5 MG/3ML) 0.083% IN NEBU: 2.5000 mg | INHALATION_SOLUTION | Freq: Four times a day (QID) | RESPIRATORY_TRACT | Status: DC | PRN

## 2021-04-04 MED ORDER — CHLORHEXIDINE GLUCONATE CLOTH 2 % EX PADS
6.0000 | MEDICATED_PAD | Freq: Every day | CUTANEOUS | Status: DC
  Administered 2021-04-05: 6 via TOPICAL

## 2021-04-04 MED ORDER — ACETAMINOPHEN 500 MG PO TABS: 1000.0000 mg | ORAL_TABLET | Freq: Four times a day (QID) | ORAL | Status: DC | PRN

## 2021-04-04 MED ORDER — CHLORHEXIDINE GLUCONATE 0.12 % MT SOLN
OROMUCOSAL | Status: AC
  Administered 2021-04-04: 15 mL via OROMUCOSAL
  Filled 2021-04-04: qty 15

## 2021-04-04 MED ORDER — 0.9 % SODIUM CHLORIDE (POUR BTL) OPTIME
TOPICAL | Status: DC | PRN
  Administered 2021-04-04: 2000 mL

## 2021-04-04 MED ORDER — UMECLIDINIUM BROMIDE 62.5 MCG/ACT IN AEPB
1.0000 | INHALATION_SPRAY | Freq: Every day | RESPIRATORY_TRACT | Status: DC
  Filled 2021-04-04: qty 7

## 2021-04-04 MED ORDER — ONDANSETRON HCL 4 MG/2ML IJ SOLN
INTRAMUSCULAR | Status: AC
  Filled 2021-04-04: qty 6

## 2021-04-04 MED ORDER — PHENYLEPHRINE HCL-NACL 20-0.9 MG/250ML-% IV SOLN
INTRAVENOUS | Status: DC | PRN
  Administered 2021-04-04: 30 ug/min via INTRAVENOUS

## 2021-04-04 MED ORDER — GUAIFENESIN-DM 100-10 MG/5ML PO SYRP: 15.0000 mL | ORAL_SOLUTION | ORAL | Status: DC | PRN

## 2021-04-04 MED ORDER — ALBUTEROL SULFATE HFA 108 (90 BASE) MCG/ACT IN AERS: 2.0000 | INHALATION_SPRAY | Freq: Four times a day (QID) | RESPIRATORY_TRACT | Status: DC | PRN

## 2021-04-04 MED ORDER — HYDROMORPHONE HCL 1 MG/ML IJ SOLN
0.5000 mg | INTRAMUSCULAR | Status: DC | PRN
  Administered 2021-04-05 (×3): 1 mg via INTRAVENOUS
  Filled 2021-04-04 (×3): qty 1

## 2021-04-04 MED ORDER — LIDOCAINE 2% (20 MG/ML) 5 ML SYRINGE
INTRAMUSCULAR | Status: DC | PRN
  Administered 2021-04-04: 60 mg via INTRAVENOUS

## 2021-04-04 MED ORDER — INSULIN ASPART 100 UNIT/ML IJ SOLN
0.0000 [IU] | Freq: Three times a day (TID) | INTRAMUSCULAR | Status: DC
  Administered 2021-04-05 (×3): 3 [IU] via SUBCUTANEOUS

## 2021-04-04 MED ORDER — HEPARIN SODIUM (PORCINE) 1000 UNIT/ML IJ SOLN
INTRAMUSCULAR | Status: DC | PRN
  Administered 2021-04-04: 9000 [IU] via INTRAVENOUS

## 2021-04-04 MED ORDER — PROPOFOL 10 MG/ML IV BOLUS
INTRAVENOUS | Status: DC | PRN
  Administered 2021-04-04 (×2): 50 mg via INTRAVENOUS
  Administered 2021-04-04 (×2): 40 mg via INTRAVENOUS
  Administered 2021-04-04: 100 mg via INTRAVENOUS

## 2021-04-04 MED ORDER — PHENOL 1.4 % MT LIQD: 1.0000 | OROMUCOSAL | Status: DC | PRN

## 2021-04-04 MED ORDER — CHLORHEXIDINE GLUCONATE CLOTH 2 % EX PADS: 6.0000 | MEDICATED_PAD | Freq: Every day | CUTANEOUS | Status: DC

## 2021-04-04 MED ORDER — FENTANYL CITRATE (PF) 250 MCG/5ML IJ SOLN
INTRAMUSCULAR | Status: DC | PRN
  Administered 2021-04-04 (×2): 50 ug via INTRAVENOUS
  Administered 2021-04-04: 100 ug via INTRAVENOUS
  Administered 2021-04-04: 50 ug via INTRAVENOUS
  Administered 2021-04-04: 100 ug via INTRAVENOUS
  Administered 2021-04-04 (×3): 50 ug via INTRAVENOUS

## 2021-04-04 MED ORDER — PHENYLEPHRINE HCL (PRESSORS) 10 MG/ML IV SOLN
INTRAVENOUS | Status: DC | PRN
Start: 2021-04-04 — End: 2021-04-04
  Administered 2021-04-04: 80 ug via INTRAVENOUS

## 2021-04-04 MED ORDER — PROTAMINE SULFATE 10 MG/ML IV SOLN
INTRAVENOUS | Status: DC | PRN
Start: 2021-04-04 — End: 2021-04-04
  Administered 2021-04-04: 50 mg via INTRAVENOUS

## 2021-04-04 MED ORDER — ACETAMINOPHEN 500 MG PO TABS: 1000.0000 mg | ORAL_TABLET | Freq: Once | ORAL | Status: DC

## 2021-04-04 MED ORDER — CARVEDILOL 12.5 MG PO TABS
12.5000 mg | ORAL_TABLET | Freq: Two times a day (BID) | ORAL | Status: DC
  Administered 2021-04-04 – 2021-04-06 (×4): 12.5 mg via ORAL
  Filled 2021-04-04 (×4): qty 1

## 2021-04-04 SURGICAL SUPPLY — 62 items
BAG COUNTER SPONGE SURGICOUNT (BAG) ×2 IMPLANT
BANDAGE ESMARK 6X9 LF (GAUZE/BANDAGES/DRESSINGS) IMPLANT
BNDG ESMARK 6X9 LF (GAUZE/BANDAGES/DRESSINGS) ×2
CANISTER SUCT 3000ML PPV (MISCELLANEOUS) ×2 IMPLANT
CANNULA VESSEL 3MM 2 BLNT TIP (CANNULA) ×2 IMPLANT
CLIP VESOCCLUDE MED 24/CT (CLIP) ×2 IMPLANT
CLIP VESOCCLUDE SM WIDE 24/CT (CLIP) ×2 IMPLANT
CUFF TOURN DUAL PORT 34 (TOURNIQUET CUFF) ×1 IMPLANT
CUFF TOURN SGL QUICK 24 (TOURNIQUET CUFF)
CUFF TOURN SGL QUICK 34 (TOURNIQUET CUFF)
CUFF TOURN SGL QUICK 42 (TOURNIQUET CUFF) IMPLANT
CUFF TRNQT CYL 24X4X16.5-23 (TOURNIQUET CUFF) IMPLANT
CUFF TRNQT CYL 34X4.125X (TOURNIQUET CUFF) IMPLANT
DERMABOND ADVANCED (GAUZE/BANDAGES/DRESSINGS) ×3
DERMABOND ADVANCED .7 DNX12 (GAUZE/BANDAGES/DRESSINGS) ×1 IMPLANT
DRAIN CHANNEL 15F RND FF W/TCR (WOUND CARE) IMPLANT
DRAPE HALF SHEET 40X57 (DRAPES) IMPLANT
DRAPE X-RAY CASS 24X20 (DRAPES) IMPLANT
ELECT REM PT RETURN 9FT ADLT (ELECTROSURGICAL) ×2
ELECTRODE REM PT RTRN 9FT ADLT (ELECTROSURGICAL) ×1 IMPLANT
EVACUATOR SILICONE 100CC (DRAIN) IMPLANT
GLOVE SRG 8 PF TXTR STRL LF DI (GLOVE) ×1 IMPLANT
GLOVE SURG POLYISO LF SZ7.5 (GLOVE) ×3 IMPLANT
GLOVE SURG UNDER POLY LF SZ6.5 (GLOVE) ×1 IMPLANT
GLOVE SURG UNDER POLY LF SZ8 (GLOVE) ×3
GOWN STRL REUS W/ TWL LRG LVL3 (GOWN DISPOSABLE) ×2 IMPLANT
GOWN STRL REUS W/ TWL XL LVL3 (GOWN DISPOSABLE) ×1 IMPLANT
GOWN STRL REUS W/TWL LRG LVL3 (GOWN DISPOSABLE) ×2
GOWN STRL REUS W/TWL XL LVL3 (GOWN DISPOSABLE) ×1
HEMOSTAT SNOW SURGICEL 2X4 (HEMOSTASIS) IMPLANT
INSERT FOGARTY SM (MISCELLANEOUS) IMPLANT
KIT BASIN OR (CUSTOM PROCEDURE TRAY) ×2 IMPLANT
KIT TURNOVER KIT B (KITS) ×2 IMPLANT
MARKER GRAFT CORONARY BYPASS (MISCELLANEOUS) IMPLANT
NS IRRIG 1000ML POUR BTL (IV SOLUTION) ×4 IMPLANT
PACK PERIPHERAL VASCULAR (CUSTOM PROCEDURE TRAY) ×2 IMPLANT
PAD ARMBOARD 7.5X6 YLW CONV (MISCELLANEOUS) ×4 IMPLANT
SET COLLECT BLD 21X3/4 12 (NEEDLE) IMPLANT
SPONGE T-LAP 18X18 ~~LOC~~+RFID (SPONGE) ×2 IMPLANT
STOCKINETTE 6  STRL (DRAPES) ×1
STOCKINETTE 6 STRL (DRAPES) IMPLANT
STOPCOCK 4 WAY LG BORE MALE ST (IV SETS) IMPLANT
SURGIFLO W/THROMBIN 8M KIT (HEMOSTASIS) IMPLANT
SUT ETHILON 3 0 PS 1 (SUTURE) IMPLANT
SUT GORETEX 6.0 TT13 (SUTURE) IMPLANT
SUT GORETEX 6.0 TT9 (SUTURE) IMPLANT
SUT PROLENE 5 0 C 1 24 (SUTURE) ×5 IMPLANT
SUT PROLENE 6 0 BV (SUTURE) ×6 IMPLANT
SUT PROLENE 7 0 BV 1 (SUTURE) IMPLANT
SUT SILK 2 0 SH (SUTURE) ×2 IMPLANT
SUT SILK 3 0 (SUTURE)
SUT SILK 3-0 18XBRD TIE 12 (SUTURE) IMPLANT
SUT VIC AB 2-0 CT1 27 (SUTURE) ×3
SUT VIC AB 2-0 CT1 TAPERPNT 27 (SUTURE) ×2 IMPLANT
SUT VIC AB 3-0 SH 27 (SUTURE) ×5
SUT VIC AB 3-0 SH 27X BRD (SUTURE) ×2 IMPLANT
SUT VICRYL 4-0 PS2 18IN ABS (SUTURE) ×6 IMPLANT
TOWEL GREEN STERILE (TOWEL DISPOSABLE) ×2 IMPLANT
TRAY FOLEY MTR SLVR 16FR STAT (SET/KITS/TRAYS/PACK) ×2 IMPLANT
TUBING EXTENTION W/L.L. (IV SETS) IMPLANT
UNDERPAD 30X36 HEAVY ABSORB (UNDERPADS AND DIAPERS) ×2 IMPLANT
WATER STERILE IRR 1000ML POUR (IV SOLUTION) ×2 IMPLANT

## 2021-04-04 NOTE — Anesthesia Preprocedure Evaluation (Addendum)
Anesthesia Evaluation  Patient identified by MRN, date of birth, ID band Patient awake    Reviewed: Allergy & Precautions, H&P , NPO status , Patient's Chart, lab work & pertinent test results, reviewed documented beta blocker date and time   Airway Mallampati: II  TM Distance: >3 FB Neck ROM: Full    Dental no notable dental hx. (+) Edentulous Upper, Edentulous Lower, Dental Advisory Given   Pulmonary COPD,  COPD inhaler, former smoker,    Pulmonary exam normal breath sounds clear to auscultation       Cardiovascular hypertension, Pt. on medications and Pt. on home beta blockers + CAD, + Cardiac Stents and + Peripheral Vascular Disease   Rhythm:Regular Rate:Normal     Neuro/Psych negative neurological ROS  negative psych ROS   GI/Hepatic Neg liver ROS, GERD  Medicated,  Endo/Other  diabetes  Renal/GU negative Renal ROS  negative genitourinary   Musculoskeletal  (+) Arthritis , Osteoarthritis,    Abdominal   Peds  Hematology negative hematology ROS (+)   Anesthesia Other Findings   Reproductive/Obstetrics negative OB ROS                            Anesthesia Physical Anesthesia Plan  ASA: 3  Anesthesia Plan: General   Post-op Pain Management: Tylenol PO (pre-op)   Induction: Intravenous  PONV Risk Score and Plan: 3 and Ondansetron, Dexamethasone and Midazolam  Airway Management Planned: Oral ETT  Additional Equipment:   Intra-op Plan:   Post-operative Plan: Extubation in OR  Informed Consent: I have reviewed the patients History and Physical, chart, labs and discussed the procedure including the risks, benefits and alternatives for the proposed anesthesia with the patient or authorized representative who has indicated his/her understanding and acceptance.     Dental advisory given  Plan Discussed with: CRNA  Anesthesia Plan Comments:         Anesthesia Quick  Evaluation

## 2021-04-04 NOTE — Anesthesia Procedure Notes (Signed)
Arterial Line Insertion Start/End1/18/2023 10:35 AM, 04/04/2021 10:45 AM Performed by: Wilburn Cornelia, CRNA, CRNA  Patient location: Pre-op. Preanesthetic checklist: patient identified, IV checked, site marked, risks and benefits discussed, surgical consent, monitors and equipment checked, pre-op evaluation, timeout performed and anesthesia consent Lidocaine 1% used for infiltration Right, radial was placed Catheter size: 20 G Hand hygiene performed  and maximum sterile barriers used  Allen's test indicative of satisfactory collateral circulation Attempts: 1 Procedure performed without using ultrasound guided technique. Following insertion, Biopatch and dressing applied. Post procedure assessment: normal

## 2021-04-04 NOTE — Anesthesia Procedure Notes (Signed)
Procedure Name: Intubation Date/Time: 04/04/2021 1:05 PM Performed by: Minerva Ends, CRNA Pre-anesthesia Checklist: Patient identified, Emergency Drugs available, Suction available and Patient being monitored Patient Re-evaluated:Patient Re-evaluated prior to induction Oxygen Delivery Method: Circle system utilized Preoxygenation: Pre-oxygenation with 100% oxygen Induction Type: IV induction Ventilation: Two handed mask ventilation required and Mask ventilation with difficulty Laryngoscope Size: Mac and 3 Grade View: Grade I Tube type: Oral Number of attempts: 1 Airway Equipment and Method: Stylet Placement Confirmation: ETT inserted through vocal cords under direct vision, positive ETCO2 and breath sounds checked- equal and bilateral Secured at: 23 cm Tube secured with: Tape Dental Injury: Teeth and Oropharynx as per pre-operative assessment

## 2021-04-04 NOTE — Progress Notes (Signed)
[facility] [place]  Subjective  - Doing well, happy the surgery was doen.   Objective (!) 161/81 79 98 F (36.7 C) 16 100%  Intake/Output Summary (Last 24 hours) at 04/04/2021 1755 Last data filed at 04/04/2021 1728 Gross per 24 hour  Intake 2150 ml  Output 370 ml  Net 1780 ml    Palpable AT pulse right LE Groin soft, leg incisions healing well no evidence of hematomas. Lungs non labored breathing   Assessment/Planning: S/P right fem-AK popliteal bypass for sever claudication symptoms. Previous  right iliofemoral endarterectomy with Dacron patch angioplasty on 12/28/2020.  He has a history of an aortobifemoral bypass graft performed at [facility] many years ago.    Stable post operative disposition  Roxy Horseman 04/04/2021 5:55 PM --  Laboratory Lab Results: Recent Labs    04/03/21 0700 04/03/21 1511  WBC  --  8.2  HGB 15.6 15.4  HCT 46.0 46.0  PLT  --  145*   BMET Recent Labs    04/03/21 0700 04/03/21 1511  NA 139 138  K 4.8 4.1  CL 104 106  CO2  --  20*  GLUCOSE 84 194*  BUN 22 18  CREATININE 0.90 0.93  CALCIUM  --  8.8*    COAG Lab Results  Component Value Date   INR 0.9 04/03/2021   INR 0.8 11/14/2020   INR 0.8 07/09/2018   No results found for: PTT

## 2021-04-04 NOTE — Transfer of Care (Signed)
Immediate Anesthesia Transfer of Care Note  Patient: Joshua [name]  Procedure(s) Performed: RIGHT FEMORAL-POPLITEAL ARTERY BYPASS GRAFT (Right: Leg Upper)  Patient Location: PACU  Anesthesia Type:General  Level of Consciousness: awake and alert   Airway & Oxygen Therapy: Patient Spontanous Breathing and Patient connected to face mask oxygen  Post-op Assessment: Report given to RN and Post -op Vital signs reviewed and stable  Post vital signs: Reviewed and stable  Last Vitals:  Vitals Value Taken Time  BP 161/81 04/04/21 1731  Temp    Pulse 74 04/04/21 1735  Resp 14 04/04/21 1735  SpO2 98 % 04/04/21 1735  Vitals shown include unvalidated device data.  Last Pain:  Vitals:   04/04/21 0908  TempSrc:   PainSc: 10-Worst pain ever         Complications: No notable events documented.

## 2021-04-04 NOTE — H&P (Signed)
[facility] [place]   Patient name: Joshua [name]   MRN: [id number]        DOB: [birth year]          Sex: male     REASON FOR VISIT:      Follow up   HISOTRY OF PRESENT ILLNESS:      Joshua [name] is a 66 y.o. male who is status post right iliofemoral endarterectomy with Dacron patch angioplasty on 12/28/2020.  He has a history of an aortobifemoral bypass graft performed at [facility] many years ago.  This was done for a right heel wound.  He did have drainage and swelling after his surgery.   He is still having issues with leg swelling.  His biggest complaint is still with claudication and numbness and burning in his feet, which is his baseline neuropathy.   Patient has a history of coronary artery disease status post PCI.  He is a diabetic with diabetic neuropathy.  He is a current smoker.  He is on a statin for hypercholesterolemia. PAST MEDICAL HISTORY:        Past Medical History:  Diagnosis Date   AC (acromioclavicular) joint bone spurs, unspecified laterality 11/25/2016   Arthritis      "back" (05/16/2015)   CAD in native artery, occluded Lcx and rotational atherectomy to LAD with DES 05/17/15 05/18/2015   Cavitary lesion of lung 02/25/2018   Chronic back pain      "all my back"   Chronic bronchitis (HCC)     COPD (chronic obstructive pulmonary disease) ([place])     Diabetes mellitus without complication (HCC)     GERD (gastroesophageal reflux disease)     H/O blood clots      "had them in my back; put filter in before one of my neck ORs"   History of gout     Hyperlipidemia     Hypertension     Neuromuscular disorder (HCC)     PAD (peripheral artery disease) (HCC)     Phlebitis     Positive TB test     Seasonal allergies     Shortness of breath     Spinal disease          FAMILY HISTORY:         Family History  Problem Relation Age of Onset   Stomach cancer Mother          Deceased, 62s    Hypertension Mother     Hypertension Father     Stroke Father     Heart attack Father          Deceased, 54   Aneurysm Brother     Healthy Daughter     Healthy Maternal Grandmother     Leukemia Grandchild     Diabetes Neg Hx     Early death Neg Hx     Colon cancer Neg Hx     Pancreatic cancer Neg Hx        SOCIAL HISTORY:    Social History         Tobacco Use   Smoking status: Former      Packs/day: 0.50      Years: 49.00      Pack years: 24.50      Types: Cigarettes      Quit date: 02/09/2021      Years since quitting: 0.1   Smokeless tobacco: Never   Tobacco comments:  half a pack a day   Substance Use Topics   Alcohol use: Yes      Alcohol/week: 0.0 standard drinks      Comment: 2 drinks a week        ALLERGIES:         Allergies  Allergen Reactions   Lisinopril Hives and Itching   Amitriptyline Other (See Comments)      Makes pt feel "weird" and nausea.   Contrast Media [Iodinated Contrast Media] Hives and Itching      Pt can use if taking benadryl    Ibuprofen Other (See Comments)      Internal bleeding        CURRENT MEDICATIONS:          Current Outpatient Medications  Medication Sig Dispense Refill   acetaminophen (TYLENOL) 500 MG tablet Take 1,000 mg by mouth every 6 (six) hours as needed for mild pain.       albuterol (PROAIR HFA) 108 (90 Base) MCG/ACT inhaler USE 2 PUFFS EVERY 6 HOURS  AS NEEDED FOR WHEEZING OR  SHORTNESS OF BREATH (Patient taking differently: Inhale 2 puffs into the lungs every 6 (six) hours as needed for wheezing or shortness of breath.) 34 g 3   albuterol (PROVENTIL) (2.5 MG/3ML) 0.083% nebulizer solution Take 3 mLs (2.5 mg total) by nebulization every 6 (six) hours as needed for wheezing or shortness of breath. 75 mL 5   aspirin EC 81 MG tablet Take 81 mg by mouth daily.       Blood Glucose Monitoring Suppl (ONETOUCH VERIO) w/Device KIT 1 Device by Does not apply route once. 1 kit 0   Camphor (JOINTFLEX EX) Apply 1  application topically daily as needed (pain).       carvedilol (COREG) 12.5 MG tablet TAKE 1 TABLET BY MOUTH  TWICE DAILY WITH MEALS 60 tablet 0   colchicine 0.6 MG tablet TAKE 2 TABLETS BY MOUTH AS  DIRECTED THEN 1 TABLET  AFTER 1 HOUR. CAN TAKE 1  TABLET TWICE DAILY  THEREAFTER. (Patient taking differently: Take 0.6 mg by mouth 2 (two) times daily as needed (gout flare).) 60 tablet 11   DULoxetine (CYMBALTA) 60 MG capsule Take 1 capsule (60 mg total) by mouth daily. For Neuropathy 30 capsule 6   ezetimibe (ZETIA) 10 MG tablet Take 1 tablet (10 mg total) by mouth daily. 90 tablet 3   fexofenadine (ALLEGRA ALLERGY) 180 MG tablet Take 1 tablet (180 mg total) by mouth daily. 90 tablet 3   Fluticasone-Umeclidin-Vilant (TRELEGY ELLIPTA) 100-62.5-25 MCG/INH AEPB Inhale 1 puff into the lungs daily. 2 each 5   glucose blood (ONETOUCH ULTRA) test strip USE AS DIRECTED 300 strip 0   losartan (COZAAR) 25 MG tablet TAKE 1 TABLET BY MOUTH  DAILY 90 tablet 3   methocarbamol (ROBAXIN) 500 MG tablet Take 500 mg by mouth every 6 (six) hours as needed for muscle spasms.       omeprazole (PRILOSEC) 40 MG capsule TAKE 1 CAPSULE BY MOUTH DAILY 90 capsule 0   ONE TOUCH LANCETS MISC 1 each by Does not apply route 3 (three) times daily. 200 each 5   OVER THE COUNTER MEDICATION Apply 1 application topically daily as needed (neuropathy pain). Hemp oil cream       OVER THE COUNTER MEDICATION Apply 1 application topically daily as needed (pain). Hempvana cream       rosuvastatin (CRESTOR) 40 MG tablet Take 1 tablet (40 mg total) by mouth daily. 90 tablet  3   traMADol (ULTRAM) 50 MG tablet Take 2 tablets (100 mg total) by mouth every 6 (six) hours as needed for moderate pain. 20 tablet 0   traZODone (DESYREL) 100 MG tablet TAKE 1 TABLET BY MOUTH AT  BEDTIME (Patient taking differently: Take 100 mg by mouth at bedtime as needed for sleep.) 90 tablet 2   pregabalin (LYRICA) 100 MG capsule Take 1 capsule (100 mg total) by mouth  daily. (Patient taking differently: Take 100 mg by mouth 3 (three) times daily.) 60 capsule 5    No current facility-administered medications for this visit.      REVIEW OF SYSTEMS:    '[X]'  denotes positive finding, '[ ]'  denotes negative finding Cardiac   Comments:  Chest pain or chest pressure:      Shortness of breath upon exertion:      Short of breath when lying flat:      Irregular heart rhythm:             Vascular      Pain in calf, thigh, or hip brought on by ambulation: x    Pain in feet at night that wakes you up from your sleep:       Blood clot in your veins:      Leg swelling:              Pulmonary      Oxygen at home:      Productive cough:       Wheezing:              Neurologic      Sudden weakness in arms or legs:       Sudden numbness in arms or legs:       Sudden onset of difficulty speaking or slurred speech:      Temporary loss of vision in one eye:       Problems with dizziness:              Gastrointestinal      Blood in stool:       Vomited blood:              Genitourinary      Burning when urinating:       Blood in urine:             Psychiatric      Major depression:              Hematologic      Bleeding problems:      Problems with blood clotting too easily:             Skin      Rashes or ulcers:             Constitutional      Fever or chills:          PHYSICAL EXAM:       Vitals:    03/26/21 1039  BP: (!) 150/75  Pulse: (!) 56  Resp: 20  Temp: 98.3 F (36.8 C)  SpO2: 96%  Weight: 206 lb (93.4 kg)  Height: 5' 8.5" (1.74 m)      GENERAL: The patient is a well-nourished male, in no acute distress. The vital signs are documented above. CARDIAC: There is a regular rate and rhythm.  VASCULAR: Nonpalpable right pedal pulse.  2+ right femoral pulse PULMONARY: Non-labored respirations ABDOMEN: Soft and non-tender with normal pitched bowel sounds.  NEUROLOGIC: No focal weakness or  paresthesias are detected. SKIN: There  are no ulcers or rashes noted. PSYCHIATRIC: The patient has a normal affect.   STUDIES:    I have reviewed the following: +-------+-----------+-----------+------------+------------+   ABI/TBI Today's ABI Today's TBI Previous ABI Previous TBI   +-------+-----------+-----------+------------+------------+   Right   0.64        0.20        0.45         0              +-------+-----------+-----------+------------+------------+   Left    Watts          0.42        0.67         0.30           +-------+-----------+-----------+------------+------------+    Right toe pressure:  32 Left toe pressure:  68   MEDICAL ISSUES:    The patient is still having significant limitations in his right leg.  He underwent angiography yesterday with failed attempt at stenting.  He is here today for femoral popliteal bypass.  Risks and benefits discussed and all questions answered      Leia Alf, MD, FACS [facility] [phone] Pager [phone]

## 2021-04-04 NOTE — Plan of Care (Signed)
°  Problem: Clinical Measurements: Goal: Ability to maintain clinical measurements within normal limits will improve Outcome: Progressing   Problem: Education: Goal: Knowledge of General Education information will improve Description: Including pain rating scale, medication(s)/side effects and non-pharmacologic comfort measures Outcome: Progressing   Problem: Clinical Measurements: Goal: Cardiovascular complication will be avoided Outcome: Progressing   Problem: Clinical Measurements: Goal: Respiratory complications will improve Outcome: Progressing   Problem: Activity: Goal: Risk for activity intolerance will decrease Outcome: Progressing   Problem: Pain Managment: Goal: General experience of comfort will improve Outcome: Progressing   Problem: Safety: Goal: Ability to remain free from injury will improve Outcome: Progressing   Problem: Skin Integrity: Goal: Risk for impaired skin integrity will decrease Outcome: Progressing

## 2021-04-04 NOTE — Op Note (Addendum)
.   Patient name: Joshua [name] MRN: [id number] DOB: 28-Dec-1955 Sex: male  04/04/2021 Pre-operative Diagnosis: Severe right leg claudication Post-operative diagnosis:  Same Surgeon:  [name] Assistants: Laurence Slate Procedure:   #1: Right femoral to above-knee popliteal bypass graft with 9 reversed ipsilateral saphenous vein   #2: Redo right femoral artery exposure Anesthesia:  General Blood Loss: 150 cc Specimens: None  Findings: The patient had dense scar tissue in his right groin from prior surgery.  I exposed the right limb of his aortobifemoral graft as well as the dacryon patch going on to his superficial femoral artery.  I did not individually isolate the superficial femoral profunda or common femoral artery.  The proximal anastomosis was placed on the hood of the right limb of the aortobifemoral graft in the proximal dacryon patch.  The distal anastomosis was to the above-knee popliteal artery which had a widely patent lumen but was moderately calcified.  He had a palpable dorsalis pedis pulse after the procedure.  Indications: This is a 66 year old gentleman with history of aortobifemoral bypass graft performed at [facility].  Approximately 3 months ago I performed a right femoral endarterectomy for claudication and performed dacryon patch angioplasty.  This did not alleviate his right leg symptoms and he is having significant difficulties with mobility.  Yesterday I attempted antegrade access and superficial femoral artery recanalization however I could not get past the proximal cap in the superficial femoral artery.  He comes in today for surgical revascularization.  Procedure:  The patient was identified in the holding area and taken to [facility]  The patient was then placed supine on the table. general anesthesia was administered.  The patient was prepped and draped in the usual sterile fashion.  A time out was called and antibiotics were administered.  A PA was necessary to  assist with exposure through counter tension, suctioning, ligation and retraction to better visualize the surgical field.  The PA expedited sewing during the case by following my sutures.  The PA also helped with wound closure.  I initially began by reopening his longitudinal right groin incision.  Cautery was used to divide subcutaneous tissue.  I performed cautery and sharp dissection until I exposed the right limb of the graft.  There was dense scar tissue from his prior surgery.  Ultimately I was able to identify the hood of the graft going into the common femoral and superficial femoral artery.  I did not individually isolate the profunda or superficial femoral artery because of the scar tissue.  Once I had adequate exposure for a proximal anastomosis, attention was turned towards the medial knee.  A longitudinal incision was made in the distal right leg.  Cautery was used divide subcutaneous tissue down to the fascia which was opened sharply.  I then entered the popliteal space and dissected out the above-knee popliteal artery.  This was approximately a 5 mm artery that had soft areas on it but did have a fair amount of calcification.  Once I had adequate exposure, I then identified the saphenous vein within this incision.  I proceeded to expose the saphenous vein up to the saphenofemoral junction through skip incisions.  The vein measured about 4 mm and was uniform in diameter.  Once I had the vein dissected out it was ligated proximally and distally with 2-0 silk ties.  The vein was prepared on the back table.  It distended nicely to approximately 5 mm.  It was marked for orientation.  Next, a curved Gore tunneler was used to create a tunnel between the popliteal artery exposure in the groin.  The patient was then fully heparinized.  After the heparin circulated, I occluded the aortobifemoral graft with a angled DeBakey clamp and the outflow with a Henley clamp.  I then used an 11 blade to open the hood  of the graft onto the dacryon patch.  There was some neointima that was removed.  I then spatulated the vein to fit the size the arteriotomy in a running anastomosis was created with 5-0 Prolene.  The clamps were then released.  The anastomosis was hemostatic.  A valvulotome was then used to lyse the valves.  3 passes were made.  There was excellent pulsatile flow through the graft.  The graft was then brought through the previously created tunnel making sure to maintain proper orientation.  A Webril was placed in the upper thigh and the tourniquet was placed.  An Esmarch was used to exsanguinate the leg and the tourniquet was taken to 250 mm of pressure.  I then opened the above-knee popliteal artery with an 11 blade and extended this longitudinally with Potts scissors.  The vein graft was then cut the appropriate length and spatulated to fit the size the arteriotomy.  A running anastomosis was then created with 6-0 Prolene.  Prior to completion, the tourniquet was let down and the appropriate flushing maneuvers were performed.  The anastomosis was then completed.  Blood flow was reestablished to the right leg.  Patient had a palpable dorsalis pedis pulse.  There was also brisk flow in the profundofemoral artery in the groin.  Next, the patient's heparin was reversed with 50 mg of protamine.  Hemostasis was achieved.  The wounds were irrigated.  The vein harvest incisions were closed with 2 layers of Vicryl followed by Dermabond.  The above-knee arterial exposure incision was closed by reapproximating the fascia with 2-0 Vicryl.  Subtenons tissue was then reapproximated with additional layers of Vicryl followed by subcuticular closure.  The groin wound was similarly closed by reapproximating the femoral sheath over the graft.  The subcutaneous tissue was then closed with multiple layers followed by subcuticular closure.  Dermabond was placed on all the incisions.  The patient was successfully extubated taken  recovery in stable condition.  There were no immediate complications.   Disposition: To PACU stable.   Theotis Burrow, M.D., [facility] [place] Office: [phone] Pager:  [phone]

## 2021-04-05 ENCOUNTER — Encounter (HOSPITAL_COMMUNITY): Payer: Self-pay | Admitting: Surgery

## 2021-04-05 LAB — CBC
HCT: 35.3 % — ABNORMAL LOW (ref 39.0–52.0)
Hemoglobin: 11.7 g/dL — ABNORMAL LOW (ref 13.0–17.0)
MCH: 30.6 pg (ref 26.0–34.0)
MCHC: 33.1 g/dL (ref 30.0–36.0)
MCV: 92.4 fL (ref 80.0–100.0)
Platelets: 135 10*3/uL — ABNORMAL LOW (ref 150–400)
RBC: 3.82 MIL/uL — ABNORMAL LOW (ref 4.22–5.81)
RDW: 14.6 % (ref 11.5–15.5)
WBC: 13.4 10*3/uL — ABNORMAL HIGH (ref 4.0–10.5)
nRBC: 0 % (ref 0.0–0.2)

## 2021-04-05 LAB — BASIC METABOLIC PANEL
Anion gap: 5 (ref 5–15)
BUN: 18 mg/dL (ref 8–23)
CO2: 23 mmol/L (ref 22–32)
Calcium: 7.9 mg/dL — ABNORMAL LOW (ref 8.9–10.3)
Chloride: 109 mmol/L (ref 98–111)
Creatinine, Ser: 0.89 mg/dL (ref 0.61–1.24)
GFR, Estimated: >60 mL/min (ref >60)
Glucose, Bld: 162 mg/dL — ABNORMAL HIGH (ref 70–99)
Potassium: 4.3 mmol/L (ref 3.5–5.1)
Sodium: 137 mmol/L (ref 135–145)

## 2021-04-05 LAB — GLUCOSE, CAPILLARY
Glucose-Capillary: 167 mg/dL — ABNORMAL HIGH (ref 70–99)
Glucose-Capillary: 167 mg/dL — ABNORMAL HIGH (ref 70–99)
Glucose-Capillary: 173 mg/dL — ABNORMAL HIGH (ref 70–99)
Glucose-Capillary: 135 mg/dL — ABNORMAL HIGH (ref 70–99)

## 2021-04-05 LAB — HEMOGLOBIN A1C
Hgb A1c MFr Bld: 5.8 % — ABNORMAL HIGH (ref 4.8–5.6)
Mean Plasma Glucose: 119.76 mg/dL

## 2021-04-05 LAB — LIPID PANEL
Cholesterol: 104 mg/dL (ref 0–200)
HDL: 59 mg/dL (ref >40)
LDL Cholesterol: 33 mg/dL (ref 0–99)
Total CHOL/HDL Ratio: 1.8 RATIO
Triglycerides: 58 mg/dL (ref <150)
VLDL: 12 mg/dL (ref 0–40)

## 2021-04-05 NOTE — Evaluation (Addendum)
Occupational Therapy Evaluation Patient Details Name: Joshua [name] MRN: [id number] DOB: 04-26-1955 Today's Date: 04/05/2021   History of Present Illness 66 yo who presents with claudication, numbness, and burning in his feet with leg swelling. Underwent R fem-pop artery bypass graft 04/04/21. PMH includes: CAD, chronic back pain, COPD, DM II, neuropathy, HLD, HTN, PAD, and aortobifemoral bypass graft.   Clinical Impression   PTA pt lives with his wife and is independent with ADL/mobility with occasional use of a cane. Pt painful, however mobilized well. Educated pt on compensatory strategies for ADL, recommendations for showerchair (wife to purchase) And strategies to reduce risk of falls. Wife will be able to assist as needed for ADL. Pt quit smoking in November and  is doing well with cessation. Pt would like to DC home tomorrow if possible. No further OT needed. OT signing off.  Recommend pt walk with staff and mobility specialists. VSS   Recommendations for follow up therapy are one component of a multi-disciplinary discharge planning process, led by the attending physician.  Recommendations may be updated based on patient status, additional functional criteria and insurance authorization.   Follow Up Recommendations  No OT follow up    Assistance Recommended at Discharge Intermittent Supervision/Assistance  Patient can return home with the following A little help with bathing/dressing/bathroom;Assist for transportation    Functional Status Assessment     Equipment Recommendations  Tub/shower seat (wife will get tubseat); RW   Recommendations for Other Services PT consult     Precautions / Restrictions Precautions Precautions: Fall      Mobility Bed Mobility Overal bed mobility: Modified Independent                  Transfers Overall transfer level: Needs assistance Equipment used: Rolling walker (2 wheels) Transfers: Sit to/from Stand Sit to Stand: Min  guard                  Balance Overall balance assessment: Mild deficits observed, not formally tested                                         ADL either performed or assessed with clinical judgement   ADL Overall ADL's : Needs assistance/impaired                         Toilet Transfer: Min guard;Ambulation           Functional mobility during ADLs: Min guard;Standard walker General ADL Comments: min A for LB ADL due to pain. Wife will be able to assist; educated on use of RW for ADL adn mobility; educated on strategies to reduce risk of falls; pt verbalized understanding     Vision Baseline Vision/History: 1 Wears glasses;0 No visual deficits       Perception     Praxis      Pertinent Vitals/Pain Pain Assessment Pain Assessment: 0-10 Pain Score: 10-Worst pain ever Pain Location: R leg Pain Descriptors / Indicators: Operative site guarding, Heaviness, Discomfort Pain Intervention(s): Limited activity within patient's tolerance, Premedicated before session     Hand Dominance Right   Extremity/Trunk Assessment Upper Extremity Assessment Upper Extremity Assessment: Overall WFL for tasks assessed   Lower Extremity Assessment Lower Extremity Assessment: Defer to PT evaluation (numbness B fee)   Cervical / Trunk Assessment Cervical / Trunk Assessment: Back Surgery (hx)   Communication  Communication Communication: No difficulties   Cognition Arousal/Alertness: Awake/alert Behavior During Therapy: WFL for tasks assessed/performed Overall Cognitive Status: Within Functional Limits for tasks assessed                                       General Comments  stopped smoking in November    Exercises Exercises: Other exercises Other Exercises Other Exercises: terminal knee extension Other Exercises: calf stretches using sheet Other Exercises: ankle pumps   Shoulder Instructions      Home Living Family/patient  expects to be discharged to:: Private residence Living Arrangements: Spouse/significant other Available Help at Discharge: Family;Available 24 hours/day (wife) Type of Home: House Home Access: Ramped entrance     Home Layout: One level     Bathroom Shower/Tub: Tub/shower unit;Door   [employer] Toilet: Standard Bathroom Accessibility: Yes How Accessible: Accessible via walker Home Equipment: Cane - single point; grab bars in shower          Prior Functioning/Environment Prior Level of Function : Independent/Modified Independent;Driving             Mobility Comments: used cane occasionally; wife assists with tub trasnfers          OT Problem List: Decreased knowledge of use of DME or AE      OT Treatment/Interventions:      OT Goals(Current goals can be found in the care plan section) Acute Rehab OT Goals Patient Stated Goal: to go home tomorrow OT Goal Formulation: All assessment and education complete, DC therapy  OT Frequency:      Co-evaluation              AM-PAC OT "6 Clicks" Daily Activity     Outcome Measure Help from another person eating meals?: None Help from another person taking care of personal grooming?: None Help from another person toileting, which includes using toliet, bedpan, or urinal?: A Little Help from another person bathing (including washing, rinsing, drying)?: A Little Help from another person to put on and taking off regular upper body clothing?: None Help from another person to put on and taking off regular lower body clothing?: A Little 6 Click Score: 21   End of Session Equipment Utilized During Treatment: Gait belt;Rolling walker (2 wheels) Nurse Communication: Mobility status  Activity Tolerance: Patient tolerated treatment well Patient left: in chair;with call bell/phone within reach  OT Visit Diagnosis: Pain                Time: 0315-9458 OT Time Calculation (min): 25 min Charges:  OT General Charges $OT Visit: 1  Visit OT Treatments $Self Care/Home Management : 8-22 mins  Maurie Boettcher, OT/L   Acute OT Clinical Specialist Acute Rehabilitation Services Pager [phone] Office [phone]   [facility] 04/05/2021, 9:14 AM

## 2021-04-05 NOTE — Anesthesia Postprocedure Evaluation (Signed)
Anesthesia Post Note  Patient: Joshua [name]  Procedure(s) Performed: RIGHT FEMORAL-POPLITEAL ARTERY BYPASS GRAFT (Right: Leg Upper)     Patient location during evaluation: PACU Anesthesia Type: General Level of consciousness: awake and alert Pain management: pain level controlled Vital Signs Assessment: post-procedure vital signs reviewed and stable Respiratory status: spontaneous breathing, nonlabored ventilation, respiratory function stable and patient connected to nasal cannula oxygen Cardiovascular status: blood pressure returned to baseline and stable Postop Assessment: no apparent nausea or vomiting Anesthetic complications: no   No notable events documented.  Last Vitals:  Vitals:   04/04/21 2313 04/05/21 0323  BP: (!) 141/66 129/61  Pulse: 64 63  Resp: 19 18  Temp: 36.6 C 36.5 C  SpO2: 96% 100%    Last Pain:  Vitals:   04/05/21 0400  TempSrc:   PainSc: Asleep                 Belenda Cruise P Alexy Heldt

## 2021-04-05 NOTE — Progress Notes (Signed)
Mobility Specialist: Progress Note   04/05/21 1623  Mobility  Activity Ambulated with assistance in hallway  Level of Assistance Minimal assist, patient does 75% or more  Assistive Device Front wheel walker  Distance Ambulated (ft) 470 ft  Activity Response Tolerated well  $Mobility charge 1 Mobility   Pre-Mobility: 73 HR Post-Mobility: 68 HR, 167/69 BP, 100% SpO2  Pt required minA to sit EOB as well as to stand. C/o 8-9/10 pain in RLE during ambulation, otherwise asymptomatic. Pt sitting EOB after walk with call bell at his side and family present in the room.   [facility] Sam Wunschel Mobility Specialist Mobility Specialist 4 [place]: [phone] Mobility Specialist 2 [place] and [place]: [phone]

## 2021-04-05 NOTE — Progress Notes (Signed)
Mobility Specialist: Progress Note   04/05/21 1141  Mobility  Bed Position Chair  Activity Ambulated with assistance in hallway  Level of Assistance Contact guard assist, steadying assist  Assistive Device Front wheel walker  Distance Ambulated (ft) 280 ft  Activity Response Tolerated well  $Mobility charge 1 Mobility   Pre-Mobility: 65 HR, 98% SpO2 Post-Mobility: 71 HR, 159/69 BP, 100% SpO2  Pt received in chair and agreeable to mobility. C/o RLE pain they rated 10/10 during mobility. Pt to chair after session with all needs met.   [facility] Joshua [name] Mobility Specialist Mobility Specialist 4 [place]: [phone] Mobility Specialist 2 [place] and [place]: [phone]

## 2021-04-05 NOTE — Progress Notes (Signed)
PHARMACIST LIPID MONITORING   Joshua [name] is a 66 y.o. male admitted on 04/04/2021 with PVD.  Pharmacy has been consulted to optimize lipid-lowering therapy with the indication of secondary prevention for clinical ASCVD.  Recent Labs:  Lipid Panel (last 6 months):   Lab Results  Component Value Date   CHOL 104 04/05/2021   TRIG 58 04/05/2021   HDL 59 04/05/2021   CHOLHDL 1.8 04/05/2021   VLDL 12 04/05/2021   LDLCALC 33 04/05/2021    Hepatic function panel (last 6 months):   Lab Results  Component Value Date   AST 26 04/03/2021   ALT 19 04/03/2021   ALKPHOS 76 04/03/2021   BILITOT 0.4 04/03/2021    SCr (since admission):   Serum creatinine: 0.89 mg/dL 04/05/21 0320 Estimated creatinine clearance: 92.9 mL/min  Current therapy and lipid therapy tolerance Current lipid-lowering therapy: crestor 40mg /d, zetia Documented or reported allergies or intolerances to lipid-lowering therapies (if applicable): none  Plan:    1.Statin intensity (high intensity recommended for all patients regardless of the LDL):  No statin changes. The patient is already on a high intensity statin.  2.Add ezetimibe : on ezetimibe  3.Refer to lipid clinic:   No  4.Follow-up with:  Primary care provider - Sandi Mariscal, MD  5.Follow-up labs after discharge:  No changes in lipid therapy, repeat a lipid panel in one year.      Hildred Laser, PharmD Clinical Pharmacist **Pharmacist phone directory can now be found on [email or web address] ([email or web address]).  Listed under [place].

## 2021-04-05 NOTE — Progress Notes (Signed)
° ° °  Subjective  - POD #1, s/p right fem pop BPG with vein  C/o soreness at incisions   Physical Exam:  Palpable AT pulse Incisions clean and dry       Assessment/Plan:  POD #1  PT/OT, mobility team Continue statin Continue ASA  [facility] [place] 04/05/2021 8:34 AM --  Vitals:   04/05/21 0323 04/05/21 0803  BP: 129/61 (!) 144/68  Pulse: 63 69  Resp: 18 14  Temp: 97.7 F (36.5 C) 99.2 F (37.3 C)  SpO2: 100% 98%    Intake/Output Summary (Last 24 hours) at 04/05/2021 0834 Last data filed at 04/05/2021 0600 Gross per 24 hour  Intake 3483.22 ml  Output 1370 ml  Net 2113.22 ml     Laboratory CBC    Component Value Date/Time   WBC 13.4 (H) 04/05/2021 0320   HGB 11.7 (L) 04/05/2021 0320   HGB 13.6 12/19/2020 1110   HGB 19.2 (H) 11/25/2016 1430   HCT 35.3 (L) 04/05/2021 0320   HCT 38.9 12/19/2020 1110   HCT 54.8 (H) 11/25/2016 1430   PLT 135 (L) 04/05/2021 0320   PLT 250 12/19/2020 1110    BMET    Component Value Date/Time   NA 137 04/05/2021 0320   NA 137 12/19/2020 1110   NA 140 11/25/2016 1430   K 4.3 04/05/2021 0320   K 4.3 11/25/2016 1430   CL 109 04/05/2021 0320   CO2 23 04/05/2021 0320   CO2 27 11/25/2016 1430   GLUCOSE 162 (H) 04/05/2021 0320   GLUCOSE 101 11/25/2016 1430   BUN 18 04/05/2021 0320   BUN 9 12/19/2020 1110   BUN 12.6 11/25/2016 1430   CREATININE 0.89 04/05/2021 0320   CREATININE 0.92 07/09/2019 1435   CREATININE 0.9 11/25/2016 1430   CALCIUM 7.9 (L) 04/05/2021 0320   CALCIUM 10.3 11/25/2016 1430   GFRNONAA >60 04/05/2021 0320   GFRAA >60 07/09/2018 1610    COAG Lab Results  Component Value Date   INR 0.9 04/03/2021   INR 0.8 11/14/2020   INR 0.8 07/09/2018   No results found for: PTT  Antibiotics Anti-infectives (From admission, onward)    Start     Dose/Rate Route Frequency Ordered Stop   04/04/21 2115  ceFAZolin (ANCEF) IVPB 2g/100 mL premix        2 g 200 mL/hr over 30 Minutes Intravenous Every 8 hours  04/04/21 2001 04/05/21 0545   04/04/21 0854  ceFAZolin (ANCEF) IVPB 2g/100 mL premix        2 g 200 mL/hr over 30 Minutes Intravenous 30 min pre-op 04/04/21 0854 04/04/21 1315        V. Leia Alf, M.D., [facility] [place] Office: [phone] Pager:  [phone]

## 2021-04-05 NOTE — Evaluation (Signed)
Physical Therapy Evaluation & Discharge Patient Details Name: Joshua [name] MRN: [id number] DOB: 15-Jun-1955 Today's Date: 04/05/2021  History of Present Illness  Pt is a 66 y.o. male admitted 04/04/21 for R femoral to above-knee popliteal artery bypass graft. PMH includes CAD, COPD, DM2, neuropathy, HTN, HLD, PAD s/p R iliefemoral endarterectomy (12/28/20), chronic back pain.   Clinical Impression  Patient evaluated by Physical Therapy with no further acute PT needs identified. PTA, pt indep with intermittent use of SPC, lives with supportive wife. Today, pt moving well with RW and supervision for safety. Educ re: precautions, positioning, therex, activity recommendations, importance of mobility. All education has been completed and the patient has no further questions. Acute PT is signing off. Patient is on mobility specialists caseload. Thank you for this referral.     Recommendations for follow up therapy are one component of a multi-disciplinary discharge planning process, led by the attending physician.  Recommendations may be updated based on patient status, additional functional criteria and insurance authorization.  Follow Up Recommendations No PT follow up    Assistance Recommended at Discharge PRN  Patient can return home with the following  Assistance with cooking/housework;A little help with bathing/dressing/bathroom    Equipment Recommendations Rolling walker (2 wheels)  Recommendations for Other Services       Functional Status Assessment       Precautions / Restrictions Precautions Precautions: Fall      Mobility  Bed Mobility               General bed mobility comments: Received sitting in recliner    Transfers Overall transfer level: Needs assistance Equipment used: Rolling walker (2 wheels) Transfers: Sit to/from Stand Sit to Stand: Supervision           General transfer comment: good technique, use of momentum to power up     Ambulation/Gait Ambulation/Gait assistance: Supervision Gait Distance (Feet): 180 Feet Assistive device: Rolling walker (2 wheels) Gait Pattern/deviations: Step-through pattern, Decreased stride length, Decreased weight shift to right, Antalgic, Trunk flexed Gait velocity: Decreased     General Gait Details: Slow, antalgic gait with RW and supervision for safety; initial cues for increasing step length and heel-to-toe gait pattern, improving with distance  Stairs            Wheelchair Mobility    Modified Rankin (Stroke Patients Only)       Balance Overall balance assessment: Mild deficits observed, not formally tested         Standing balance support: No upper extremity supported, During functional activity, Bilateral upper extremity supported Standing balance-Leahy Scale: Fair Standing balance comment: can static stand without UE support                             Pertinent Vitals/Pain Pain Assessment Pain Assessment: Faces Faces Pain Scale: Hurts even more Pain Location: R leg Pain Descriptors / Indicators: Operative site guarding, Heaviness, Discomfort Pain Intervention(s): Monitored during session, Patient requesting pain meds-RN notified    Home Living Family/patient expects to be discharged to:: Private residence Living Arrangements: Spouse/significant other Available Help at Discharge: Family;Available 24 hours/day Type of Home: House Home Access: Ramped entrance       Home Layout: One level Home Equipment: Cane - single point Additional Comments: Plans to order shower seat himself    Prior Function Prior Level of Function : Independent/Modified Independent;Driving             Mobility  Comments: Intermittent use of SPC ADLs Comments: wife assists with tub transfers. Pt reports checking his feet daily due to h/o neuropathy (R>L)     Hand Dominance   Dominant Hand: Right    Extremity/Trunk Assessment   Upper Extremity  Assessment Upper Extremity Assessment: Overall WFL for tasks assessed    Lower Extremity Assessment Lower Extremity Assessment: RLE deficits/detail;LLE deficits/detail RLE Deficits / Details: s/p vascular intervention with expected post-op pain and weakness RLE Sensation: decreased light touch;history of peripheral neuropathy LLE Sensation: decreased light touch;history of peripheral neuropathy    Cervical / Trunk Assessment Cervical / Trunk Assessment: Back Surgery (hx)  Communication   Communication: No difficulties  Cognition Arousal/Alertness: Awake/alert Behavior During Therapy: WFL for tasks assessed/performed Overall Cognitive Status: Within Functional Limits for tasks assessed                                          General Comments General comments (skin integrity, edema, etc.): reviewed educ re: precautions, positioning, therex, activity recommendations, importance of mobility    Exercises Other Exercises Other Exercises: Medbridge HEP handout (Access Code [place]) provided and practiced - calf stretch with sheet, hamstring stretch, heel slide with sheet (pt declined this one due to pain)   Assessment/Plan    PT Assessment Patient does not need any further PT services  PT Problem List         PT Treatment Interventions      PT Goals (Current goals can be found in the Care Plan section)  Acute Rehab PT Goals Patient Stated Goal: home tomorrow PT Goal Formulation: All assessment and education complete, DC therapy    Frequency       Co-evaluation               AM-PAC PT "6 Clicks" Mobility  Outcome Measure Help needed turning from your back to your side while in a flat bed without using bedrails?: None Help needed moving from lying on your back to sitting on the side of a flat bed without using bedrails?: None Help needed moving to and from a bed to a chair (including a wheelchair)?: A Little Help needed standing up from a chair  using your arms (e.g., wheelchair or bedside chair)?: A Little Help needed to walk in hospital room?: A Little Help needed climbing 3-5 steps with a railing? : A Little 6 Click Score: 20    End of Session Equipment Utilized During Treatment: Gait belt Activity Tolerance: Patient tolerated treatment well Patient left: in chair;with call bell/phone within reach;with nursing/sitter in room Nurse Communication: Mobility status PT Visit Diagnosis: Other abnormalities of gait and mobility (R26.89);Pain Pain - Right/Left: Right Pain - part of body: Leg;Knee    Time: 5361-4431 PT Time Calculation (min) (ACUTE ONLY): 24 min   Charges:   PT Evaluation $PT Eval Moderate Complexity: 1 Mod PT Treatments $Therapeutic Exercise: 8-22 mins   Mabeline Caras, PT, DPT Acute Rehabilitation Services  Pager [phone] Office [place] 04/05/2021, 9:57 AM

## 2021-04-06 ENCOUNTER — Other Ambulatory Visit (HOSPITAL_COMMUNITY): Payer: Self-pay

## 2021-04-06 LAB — GLUCOSE, CAPILLARY
Glucose-Capillary: 93 mg/dL (ref 70–99)
Glucose-Capillary: 90 mg/dL (ref 70–99)

## 2021-04-06 MED ORDER — OXYCODONE HCL 5 MG PO TABS
5.0000 mg | ORAL_TABLET | Freq: Four times a day (QID) | ORAL | 0 refills | Status: DC | PRN
  Filled 2021-04-06: qty 20, 5d supply, fill #0

## 2021-04-06 NOTE — TOC Transition Note (Addendum)
Transition of Care (TOC) - CM/SW Discharge Note Marvetta Gibbons RN, BSN Transitions of Care Unit 4E- RN Case Manager See Treatment Team for direct phone #    Patient Details  Name: ZUHAYR DEENEY MRN: [id number] Date of Birth: Apr 24, 1955  Transition of Care [facility]) CM/SW Contact:  Dawayne Patricia, RN Phone Number: 04/06/2021, 10:31 AM   Clinical Narrative:    Pt s/p vascular fempop bypass, from home w/ spouse.  Transition of Care Department [facility]) has reviewed patient and no TOC needs have been identified at this time.  Pt to return home w/ spouse, no transportation needs.    Final next level of care: Home/Self Care Barriers to Discharge: No Barriers Identified   Patient Goals and CMS Choice     Choice offered to / list presented to : NA  Discharge Placement               Home        Discharge Plan and Services                DME Arranged: N/A DME Agency: NA       HH Arranged: NA HH Agency: NA        Social Determinants of Health (SDOH) Interventions     Readmission Risk Interventions Readmission Risk Prevention Plan 04/06/2021  Transportation Screening Complete  PCP or Specialist Appt within 3-5 Days Complete  HRI or [place] Complete  Social Work Consult for [place] Planning/Counseling Complete  Palliative Care Screening Not Applicable  Medication Review Press photographer) Complete  Some recent data might be hidden

## 2021-04-06 NOTE — Progress Notes (Signed)
Patient given discharge instructions. Wife present. PIV removed. Telemetry box removed, CCMD notified. Patient taken to vehicle by wheelchair by staff.  Daymon Larsen, RN

## 2021-04-06 NOTE — Progress Notes (Signed)
Mobility Specialist: Progress Note   04/06/21 1222  Mobility  Bed Position Chair  Activity Ambulated with assistance in hallway  Level of Assistance Standby assist, set-up cues, supervision of patient - no hands on  Assistive Device Front wheel walker  Distance Ambulated (ft) 300 ft  Activity Response Tolerated well  $Mobility charge 1 Mobility   Pre-Mobility: 74 HR Post-Mobility: 70 HR  Pt independent with bed mobility and Mod I to stand. C/o stiffness and burning in RLE upon standing but resolved with distance. Pt to recliner after walk with call bell in reach.   [employer] Julian Askin Mobility Specialist Mobility Specialist [address] [place]: [phone] Mobility Specialist [address] [place] and [place]: [phone]

## 2021-04-06 NOTE — Discharge Instructions (Signed)
 [facility] [place]  Discharge instructions  Lower Extremity Bypass Surgery  Please refer to the following instruction for your post-procedure care. Your surgeon or physician assistant will discuss any changes with you.  Activity  You are encouraged to walk as much as you can. You can slowly return to normal activities during the month after your surgery. Avoid strenuous activity and heavy lifting until your doctor tells you it's OK. Avoid activities such as vacuuming or swinging a golf club. Do not drive until your doctor give the OK and you are no longer taking prescription pain medications. It is also normal to have difficulty with sleep habits, eating and bowel movement after surgery. These will go away with time.  Bathing/Showering  You may shower after you go home. Do not soak in a bathtub, hot tub, or swim until the incision heals completely.  Incision Care  Clean your incision with mild soap and water. Shower every day. Pat the area dry with a clean towel. You do not need a bandage unless otherwise instructed. Do not apply any ointments or creams to your incision. If you have open wounds you will be instructed how to care for them or a visiting nurse may be arranged for you. If you have staples or sutures along your incision they will be removed at your post-op appointment. You may have skin glue on your incision. Do not peel it off. It will come off on its own in about one week. If you have a great deal of moisture in your groin, use a gauze help keep this area dry.  Diet  Resume your normal diet. There are no special food restrictions following this procedure. A low fat/ low cholesterol diet is recommended for all patients with vascular disease. In order to heal from your surgery, it is CRITICAL to get adequate nutrition. Your body requires vitamins, minerals, and protein. Vegetables are the best source of vitamins and minerals. Vegetables also provide the  perfect balance of protein. Processed food has little nutritional value, so try to avoid this.  Medications  Resume taking all your medications unless your doctor or nurse practitioner tells you not to. If your incision is causing pain, you may take over-the-counter pain relievers such as acetaminophen (Tylenol). If you were prescribed a stronger pain medication, please aware these medication can cause nausea and constipation. Prevent nausea by taking the medication with a snack or meal. Avoid constipation by drinking plenty of fluids and eating foods with high amount of fiber, such as fruits, vegetables, and grains. Take Colase 100 mg (an over-the-counter stool softener) twice a day as needed for constipation. Do not take Tylenol if you are taking prescription pain medications.  Follow Up  Our office will schedule a follow up appointment 2-3 weeks following discharge.  Please call us immediately for any of the following conditions  Severe or worsening pain in your legs or feet while at rest or while walking Increase pain, redness, warmth, or drainage (pus) from your incision site(s) Fever of 101 degree or higher The swelling in your leg with the bypass suddenly worsens and becomes more painful than when you were in the hospital If you have been instructed to feel your graft pulse then you should do so every day. If you can no longer feel this pulse, call the office immediately. Not all patients are given this instruction.  Leg swelling is common after leg bypass surgery.  The swelling should improve over a few months   following surgery. To improve the swelling, you may elevate your legs above the level of your heart while you are sitting or resting. Your surgeon or physician assistant may ask you to apply an ACE wrap or wear compression (TED) stockings to help to reduce swelling.  Reduce your risk of vascular disease  Stop smoking. If you would like help call [facility] at 1-800-QUIT-NOW  (1-800-784-8669) or [place] at 336-586-4000.  Manage your cholesterol Maintain a desired weight Control your diabetes weight Control your diabetes Keep your blood pressure down  If you have any questions, please call the office at [phone]   

## 2021-04-06 NOTE — Progress Notes (Signed)
°  Progress Note    04/06/2021 7:43 AM 2 Days Post-Op  Subjective:  ready for d/c home   Vitals:   04/05/21 2334 04/06/21 0308  BP: 136/75 113/61  Pulse: 63 67  Resp: 18 18  Temp: 98.2 F (36.8 C) 98.8 F (37.1 C)  SpO2: 100% 94%   Physical Exam: Lungs:  non labored Incisions:  RLE incisions c/d/i Extremities:  palpable R ATA Neurologic: A&O  CBC    Component Value Date/Time   WBC 13.4 (H) 04/05/2021 0320   RBC 3.82 (L) 04/05/2021 0320   HGB 11.7 (L) 04/05/2021 0320   HGB 13.6 12/19/2020 1110   HGB 19.2 (H) 11/25/2016 1430   HCT 35.3 (L) 04/05/2021 0320   HCT 38.9 12/19/2020 1110   HCT 54.8 (H) 11/25/2016 1430   PLT 135 (L) 04/05/2021 0320   PLT 250 12/19/2020 1110   MCV 92.4 04/05/2021 0320   MCV 99 (H) 12/19/2020 1110   MCV 92.3 11/25/2016 1430   MCH 30.6 04/05/2021 0320   MCHC 33.1 04/05/2021 0320   RDW 14.6 04/05/2021 0320   RDW 11.8 12/19/2020 1110   RDW 15.0 (H) 11/25/2016 1430   LYMPHSABS 1.6 11/14/2020 1115   LYMPHSABS 2.5 11/25/2016 1430   MONOABS 0.5 11/14/2020 1115   MONOABS 1.3 (H) 11/25/2016 1430   EOSABS 0.1 11/14/2020 1115   EOSABS 0.3 11/25/2016 1430   BASOSABS 0.0 11/14/2020 1115   BASOSABS 0.0 11/25/2016 1430    BMET    Component Value Date/Time   NA 137 04/05/2021 0320   NA 137 12/19/2020 1110   NA 140 11/25/2016 1430   K 4.3 04/05/2021 0320   K 4.3 11/25/2016 1430   CL 109 04/05/2021 0320   CO2 23 04/05/2021 0320   CO2 27 11/25/2016 1430   GLUCOSE 162 (H) 04/05/2021 0320   GLUCOSE 101 11/25/2016 1430   BUN 18 04/05/2021 0320   BUN 9 12/19/2020 1110   BUN 12.6 11/25/2016 1430   CREATININE 0.89 04/05/2021 0320   CREATININE 0.92 07/09/2019 1435   CREATININE 0.9 11/25/2016 1430   CALCIUM 7.9 (L) 04/05/2021 0320   CALCIUM 10.3 11/25/2016 1430   GFRNONAA >60 04/05/2021 0320   GFRAA >60 07/09/2018 1610    INR    Component Value Date/Time   INR 0.9 04/03/2021 1511     Intake/Output Summary (Last 24 hours) at 04/06/2021  0743 Last data filed at 04/06/2021 0500 Gross per 24 hour  Intake 300 ml  Output 1460 ml  Net -1160 ml     Assessment/Plan:  66 y.o. male is s/p R fem pop with vein 2 Days Post-Op   RLE well perfused with palpable ATA Passed PT/OT eval yesterday Ok for d/c home; follow up in 2-3 weeks   Dagoberto Ligas, PA-C [facility] [phone] 04/06/2021 7:43 AM

## 2021-04-09 NOTE — Discharge Summary (Signed)
Bypass Discharge Summary Patient ID: Joshua [name] [id number] 65 y.o. 10-Jul-1955  Admit date: 04/04/2021  Discharge date and time: 04/06/2021  3:14 PM   Admitting Physician: Serafina Mitchell, MD   Discharge Physician: Same  Admission Diagnoses: PAD (peripheral artery disease) [facility]) [I73.9]  Discharge Diagnoses: Same  Admission Condition: fair  Discharged Condition: fair  Indication for Admission: Lifestyle limiting right lower extremity claudication  Hospital Course: [name] is a 66 year old male who was brought in as an outpatient for right femoral to above-the-knee popliteal artery bypass with vein by Dr. Trula Slade on 04/04/2021.  This was performed due to lifestyle limiting claudication.  He tolerated the procedure well and was admitted to the hospital postoperatively.  He maintained a palpable right anterior tibial artery pulse throughout his hospital stay.  On postoperative day #2 he was ready for discharge home.  He was prescribed narcotic pain medication for continued postoperative pain control.  He will follow-up in office in about 2 to 3 weeks.  He was discharged home in stable condition.  Consults: None  Treatments: surgery: Right femoral to above-the-knee popliteal bypass with vein by Dr. Trula Slade on 04/04/2021    Disposition: Discharge disposition: 01-Home or Self Care       - For [facility] Registry use ---  Post-op:  Wound infection: No  Graft infection: No  Transfusion: No  New Arrhythmia: No Patency judged by: '[ ]'  Dopper only, '[ ]'  Palpable graft pulse, [ x] Palpable distal pulse, '[ ]'  ABI inc. > 0.15, '[ ]'  Duplex D/C Ambulatory Status: Ambulatory  Complications: MI: [x ] No, '[ ]'  Troponin only, '[ ]'  EKG or Clinical CHF: No Resp failure: '[ ]'  none, '[ ]'  Pneumonia, '[ ]'  Ventilator Chg in renal function: [x ] none, '[ ]'  Inc. Cr > 0.5, '[ ]'  Temp. Dialysis, '[ ]'  Permanent dialysis Stroke: '[ ]'  None, '[ ]'  Minor, '[ ]'  Major Return to OR: No  Reason for return  to OR: '[ ]'  Bleeding, '[ ]'  Infection, '[ ]'  Thrombosis, '[ ]'  Revision  Discharge medications: Statin use:  Yes ASA use:  Yes Plavix use:  No  for medical reason not indicated Beta blocker use: Yes Coumadin use: No  for medical reason not indicated    Patient Instructions:  Allergies as of 04/06/2021       Reactions   Lisinopril Hives, Itching   Amitriptyline Other (See Comments)   Makes pt feel "weird" and nausea.   Contrast Media [iodinated Contrast Media] Hives, Itching   Pt can use if taking benadryl    Ibuprofen Other (See Comments)   Internal bleeding        Medication List     TAKE these medications    acetaminophen 500 MG tablet Commonly known as: TYLENOL Take 1,000 mg by mouth every 6 (six) hours as needed for mild pain.   albuterol (2.5 MG/3ML) 0.083% nebulizer solution Commonly known as: PROVENTIL Take 3 mLs (2.5 mg total) by nebulization every 6 (six) hours as needed for wheezing or shortness of breath. What changed: Another medication with the same name was changed. Make sure you understand how and when to take each.   albuterol 108 (90 Base) MCG/ACT inhaler Commonly known as: ProAir HFA USE 2 PUFFS EVERY 6 HOURS  AS NEEDED FOR WHEEZING OR  SHORTNESS OF BREATH What changed:  how much to take how to take this when to take this reasons to take this additional instructions   aspirin EC 81 MG tablet Take 81  mg by mouth daily.   carvedilol 12.5 MG tablet Commonly known as: COREG TAKE 1 TABLET BY MOUTH  TWICE DAILY WITH MEALS   colchicine 0.6 MG tablet TAKE 2 TABLETS BY MOUTH AS  DIRECTED THEN 1 TABLET  AFTER 1 HOUR. CAN TAKE 1  TABLET TWICE DAILY  THEREAFTER. What changed:  how much to take how to take this when to take this reasons to take this additional instructions   DULoxetine 60 MG capsule Commonly known as: Cymbalta Take 1 capsule (60 mg total) by mouth daily. For Neuropathy   ezetimibe 10 MG tablet Commonly known as: Zetia Take 1 tablet  (10 mg total) by mouth daily.   JOINTFLEX EX Apply 1 application topically daily as needed (pain).   methocarbamol 500 MG tablet Commonly known as: ROBAXIN Take 500 mg by mouth every 6 (six) hours as needed for muscle spasms.   omeprazole 40 MG capsule Commonly known as: PRILOSEC TAKE 1 CAPSULE BY MOUTH DAILY   ONE TOUCH LANCETS Misc 1 each by Does not apply route 3 (three) times daily.   OneTouch Ultra test strip Generic drug: glucose blood USE AS DIRECTED   OneTouch Verio w/Device Kit 1 Device by Does not apply route once.   oxyCODONE 5 MG immediate release tablet Commonly known as: Oxy IR/ROXICODONE Take 1 tablet (5 mg total) by mouth every 6 (six) hours as needed for moderate pain.   pregabalin 100 MG capsule Commonly known as: LYRICA Take 100 mg by mouth 3 (three) times daily.   rosuvastatin 40 MG tablet Commonly known as: CRESTOR Take 1 tablet (40 mg total) by mouth daily.   traMADol 50 MG tablet Commonly known as: ULTRAM Take 2 tablets (100 mg total) by mouth every 6 (six) hours as needed for moderate pain.   traZODone 100 MG tablet Commonly known as: DESYREL TAKE 1 TABLET BY MOUTH AT  BEDTIME What changed:  when to take this reasons to take this   Trelegy Ellipta 100-62.5-25 MCG/ACT Aepb Generic drug: Fluticasone-Umeclidin-Vilant Inhale 1 puff into the lungs daily.       Activity: activity as tolerated Diet: regular diet Wound Care: none needed  Follow-up with [facility] in 2 weeks.  SignedDagoberto Ligas 04/09/2021 9:07 AM

## 2021-04-10 ENCOUNTER — Other Ambulatory Visit: Payer: Self-pay | Admitting: Physician Assistant

## 2021-04-10 ENCOUNTER — Telehealth: Payer: Self-pay

## 2021-04-10 MED ORDER — OXYCODONE HCL 5 MG PO TABS: 5.0000 mg | ORAL_TABLET | Freq: Four times a day (QID) | ORAL | 0 refills | Status: DC | PRN

## 2021-04-10 NOTE — Telephone Encounter (Signed)
Pt called requesting more narcotics due to only having a few more left. He is c/o pain and swelling in R knee that goes towards R groin. Pt has been ordered more pain meds and is aware if he doesn't see improvement in pain and swelling this week, he is to call us back to be seen in office.

## 2021-04-16 ENCOUNTER — Telehealth: Payer: Self-pay | Admitting: Family Medicine

## 2021-04-16 NOTE — Telephone Encounter (Signed)
Joshua [name] from [facility] called in states she is resending prescription for diabetes supply because she didn't hear anything back when it was sent Jan 19 for diabetes supply. She says the previous one that ws sent was scratched over so they coudnt accept it

## 2021-04-26 ENCOUNTER — Ambulatory Visit (INDEPENDENT_AMBULATORY_CARE_PROVIDER_SITE_OTHER): Payer: Medicare Other | Admitting: Physician Assistant

## 2021-04-26 ENCOUNTER — Other Ambulatory Visit: Payer: Self-pay

## 2021-04-26 VITALS — BP 155/75 | HR 67 | Temp 98.2°F | Resp 20 | Ht 69.0 in | Wt 212.9 lb

## 2021-04-26 DIAGNOSIS — I70213 Atherosclerosis of native arteries of extremities with intermittent claudication, bilateral legs: Secondary | ICD-10-CM

## 2021-04-26 MED ORDER — OXYCODONE-ACETAMINOPHEN 5-325 MG PO TABS: 1.0000 | ORAL_TABLET | Freq: Four times a day (QID) | ORAL | 0 refills | Status: DC | PRN

## 2021-04-26 NOTE — Progress Notes (Signed)
POST OPERATIVE OFFICE NOTE    CC:  F/u for surgery  HPI:  This is a 66 y.o. male who is s/p right femoral to above-the-knee popliteal bypass with vein by Dr. Trula Slade on 04/04/2021.  He previously underwent right femoral endarterectomy on 12/28/2020 by Dr. Trula Slade.  He also has history of aortobifemoral bypass at [place].  Patient is complaining of pain around the areas of the incisions as well as edema of the right leg.  He states the discomfort is impacting his mobility during the day.  He states he has stopped smoking.  He is on aspirin and statin daily.  He is also requesting more pain medication.  Allergies  Allergen Reactions   Lisinopril Hives and Itching   Amitriptyline Other (See Comments)    Makes pt feel "weird" and nausea.   Contrast Media [Iodinated Contrast Media] Hives and Itching    Pt can use if taking benadryl    Ibuprofen Other (See Comments)    Internal bleeding    Current Outpatient Medications  Medication Sig Dispense Refill   acetaminophen (TYLENOL) 500 MG tablet Take 1,000 mg by mouth every 6 (six) hours as needed for mild pain.     albuterol (PROAIR HFA) 108 (90 Base) MCG/ACT inhaler USE 2 PUFFS EVERY 6 HOURS  AS NEEDED FOR WHEEZING OR  SHORTNESS OF BREATH (Patient taking differently: Inhale 2 puffs into the lungs every 6 (six) hours as needed for wheezing or shortness of breath.) 34 g 3   albuterol (PROVENTIL) (2.5 MG/3ML) 0.083% nebulizer solution Take 3 mLs (2.5 mg total) by nebulization every 6 (six) hours as needed for wheezing or shortness of breath. 75 mL 5   aspirin EC 81 MG tablet Take 81 mg by mouth daily.     Blood Glucose Monitoring Suppl (ONETOUCH VERIO) w/Device KIT 1 Device by Does not apply route once. 1 kit 0   Camphor (JOINTFLEX EX) Apply 1 application topically daily as needed (pain).     carvedilol (COREG) 12.5 MG tablet TAKE 1 TABLET BY MOUTH  TWICE DAILY WITH MEALS 60 tablet 0   colchicine 0.6 MG tablet TAKE 2 TABLETS BY MOUTH AS  DIRECTED THEN 1  TABLET  AFTER 1 HOUR. CAN TAKE 1  TABLET TWICE DAILY  THEREAFTER. (Patient taking differently: Take 0.6 mg by mouth 2 (two) times daily as needed (gout flare).) 60 tablet 11   DULoxetine (CYMBALTA) 60 MG capsule Take 1 capsule (60 mg total) by mouth daily. For Neuropathy 30 capsule 6   ezetimibe (ZETIA) 10 MG tablet Take 1 tablet (10 mg total) by mouth daily. 90 tablet 3   Fluticasone-Umeclidin-Vilant (TRELEGY ELLIPTA) 100-62.5-25 MCG/INH AEPB Inhale 1 puff into the lungs daily. 2 each 5   glucose blood (ONETOUCH ULTRA) test strip USE AS DIRECTED 300 strip 0   methocarbamol (ROBAXIN) 500 MG tablet Take 500 mg by mouth every 6 (six) hours as needed for muscle spasms.     omeprazole (PRILOSEC) 40 MG capsule TAKE 1 CAPSULE BY MOUTH DAILY 90 capsule 0   ONE TOUCH LANCETS MISC 1 each by Does not apply route 3 (three) times daily. 200 each 5   oxyCODONE-acetaminophen (PERCOCET/ROXICET) 5-325 MG tablet Take 1 tablet by mouth every 6 (six) hours as needed for severe pain. 20 tablet 0   pregabalin (LYRICA) 100 MG capsule Take 100 mg by mouth 3 (three) times daily.     rosuvastatin (CRESTOR) 40 MG tablet Take 1 tablet (40 mg total) by mouth daily. 90 tablet 3  traZODone (DESYREL) 100 MG tablet TAKE 1 TABLET BY MOUTH AT  BEDTIME (Patient taking differently: Take 100 mg by mouth at bedtime as needed for sleep.) 90 tablet 2   No current facility-administered medications for this visit.     ROS:  See HPI  Physical Exam:  Vitals:   04/26/21 1022  BP: (!) 155/75  Pulse: 67  Resp: 20  Temp: 98.2 F (36.8 C)  TempSrc: Temporal  SpO2: 99%  Weight: 212 lb 14.4 oz (96.6 kg)  Height: '5\' 9"'  (1.753 m)    Incision: Right groin incision nearly healed; vein harvest incisions nearly healed Extremities: Pitting edema of the right leg up to the midshin; palpable right DP pulse Neuro: A&O Abdomen:  soft  Assessment/Plan:  This is a 66 y.o. male who is s/p: Right femoral to above-the-knee popliteal bypass  with vein  -Right leg well-perfused with palpable DP pulse -Incisions of the right leg appear to be healing well; encouraged the patient to wash with soap and water daily, especially the groin incision -Encouraged patient to ambulate as much as possible; he can elevate his leg above the level of his heart when he is resting during the day to manage edema -Check bypass duplex in 1 month -I also prescribed 5/325 mg Percocet for continued postoperative pain control   Dagoberto Ligas, PA-C [facility] [phone]  Clinic MD:  Scot Dock

## 2021-05-02 ENCOUNTER — Other Ambulatory Visit: Payer: Self-pay | Admitting: Family Medicine

## 2021-05-02 NOTE — Telephone Encounter (Signed)
Requested Prescriptions  Pending Prescriptions Disp Refills   ONETOUCH ULTRA test strip [Pharmacy Med Name: OneTouch Ultra In Vitro Strip] 300 strip 1    Sig: USE AS DIRECTED     Endocrinology: Diabetes - Testing Supplies Passed - 05/02/2021  6:26 AM      Passed - Valid encounter within last 12 months    Recent Outpatient Visits          4 months ago Type 2 diabetes mellitus with peripheral neuropathy ([place])   [facility], Charlane Ferretti, MD   9 years ago HTN (hypertension)   [facility], Fenton Malling, MD

## 2021-05-04 ENCOUNTER — Other Ambulatory Visit: Payer: Self-pay | Admitting: *Deleted

## 2021-05-04 DIAGNOSIS — I739 Peripheral vascular disease, unspecified: Secondary | ICD-10-CM

## 2021-05-04 DIAGNOSIS — I70213 Atherosclerosis of native arteries of extremities with intermittent claudication, bilateral legs: Secondary | ICD-10-CM

## 2021-05-22 ENCOUNTER — Ambulatory Visit (HOSPITAL_COMMUNITY)
Admission: RE | Admit: 2021-05-22 | Discharge: 2021-05-22 | Disposition: A | Discharge status: Home / Self Care | Payer: Medicare Other | Source: Ambulatory Visit | Attending: Physician Assistant | Admitting: Physician Assistant

## 2021-05-22 ENCOUNTER — Other Ambulatory Visit: Payer: Self-pay

## 2021-05-22 ENCOUNTER — Ambulatory Visit (INDEPENDENT_AMBULATORY_CARE_PROVIDER_SITE_OTHER): Payer: Medicare Other | Admitting: Physician Assistant

## 2021-05-22 ENCOUNTER — Ambulatory Visit (INDEPENDENT_AMBULATORY_CARE_PROVIDER_SITE_OTHER)
Admission: RE | Admit: 2021-05-22 | Discharge: 2021-05-22 | Disposition: A | Discharge status: Home / Self Care | Payer: Medicare Other | Source: Ambulatory Visit | Attending: Physician Assistant | Admitting: Physician Assistant

## 2021-05-22 VITALS — BP 133/66 | HR 65 | Temp 97.7°F | Resp 18 | Ht 69.0 in | Wt 215.0 lb

## 2021-05-22 DIAGNOSIS — I70213 Atherosclerosis of native arteries of extremities with intermittent claudication, bilateral legs: Secondary | ICD-10-CM | POA: Diagnosis not present

## 2021-05-22 DIAGNOSIS — I739 Peripheral vascular disease, unspecified: Secondary | ICD-10-CM | POA: Insufficient documentation

## 2021-05-22 MED ORDER — OXYCODONE-ACETAMINOPHEN 5-325 MG PO TABS: 1.0000 | ORAL_TABLET | Freq: Four times a day (QID) | ORAL | 0 refills | Status: DC | PRN

## 2021-05-22 NOTE — Progress Notes (Signed)
?POST OPERATIVE OFFICE NOTE ? ? ? ?CC:  F/u for surgery ? ?HPI:  This is a 66 y.o. male who is s/p right femoral to above-the-knee popliteal bypass with vein by Dr. Trula Slade on 04/04/2021.  He previously underwent right femoral endarterectomy on 12/28/2020 by Dr. Trula Slade.  He also has history of aortobifemoral bypass at [place].  Patient's biggest complaint is of right leg edema.  He also continues to have pain with ambulation.  He states all incisions have completely healed.  He denies rest pain in his foot or nonhealing wounds of his toes.  He is on aspirin and statin daily.  He states he has quit smoking since surgery however there is a strong cigarette smell in the room.  Patient's wife is also present. ? ?Allergies  ?Allergen Reactions  ? Lisinopril Hives and Itching  ? Amitriptyline Other (See Comments)  ?  Makes pt feel "weird" and nausea.  ? Contrast Media [Iodinated Contrast Media] Hives and Itching  ?  Pt can use if taking benadryl   ? Ibuprofen Other (See Comments)  ?  Internal bleeding  ? ? ?Current Outpatient Medications  ?Medication Sig Dispense Refill  ? acetaminophen (TYLENOL) 500 MG tablet Take 1,000 mg by mouth every 6 (six) hours as needed for mild pain.    ? albuterol (PROAIR HFA) 108 (90 Base) MCG/ACT inhaler USE 2 PUFFS EVERY 6 HOURS  AS NEEDED FOR WHEEZING OR  SHORTNESS OF BREATH (Patient taking differently: Inhale 2 puffs into the lungs every 6 (six) hours as needed for wheezing or shortness of breath.) 34 g 3  ? albuterol (PROVENTIL) (2.5 MG/3ML) 0.083% nebulizer solution Take 3 mLs (2.5 mg total) by nebulization every 6 (six) hours as needed for wheezing or shortness of breath. 75 mL 5  ? aspirin EC 81 MG tablet Take 81 mg by mouth daily.    ? Blood Glucose Monitoring Suppl (ONETOUCH VERIO) w/Device KIT 1 Device by Does not apply route once. 1 kit 0  ? Camphor (JOINTFLEX EX) Apply 1 application topically daily as needed (pain).    ? carvedilol (COREG) 12.5 MG tablet TAKE 1 TABLET BY MOUTH  TWICE  DAILY WITH MEALS 60 tablet 0  ? colchicine 0.6 MG tablet TAKE 2 TABLETS BY MOUTH AS  DIRECTED THEN 1 TABLET  AFTER 1 HOUR. CAN TAKE 1  TABLET TWICE DAILY  THEREAFTER. (Patient taking differently: Take 0.6 mg by mouth 2 (two) times daily as needed (gout flare).) 60 tablet 11  ? DULoxetine (CYMBALTA) 60 MG capsule Take 1 capsule (60 mg total) by mouth daily. For Neuropathy 30 capsule 6  ? ezetimibe (ZETIA) 10 MG tablet Take 1 tablet (10 mg total) by mouth daily. 90 tablet 3  ? Fluticasone-Umeclidin-Vilant (TRELEGY ELLIPTA) 100-62.5-25 MCG/INH AEPB Inhale 1 puff into the lungs daily. 2 each 5  ? methocarbamol (ROBAXIN) 500 MG tablet Take 500 mg by mouth every 6 (six) hours as needed for muscle spasms.    ? omeprazole (PRILOSEC) 40 MG capsule TAKE 1 CAPSULE BY MOUTH DAILY 90 capsule 0  ? ONE TOUCH LANCETS MISC 1 each by Does not apply route 3 (three) times daily. 200 each 5  ? ONETOUCH ULTRA test strip USE AS DIRECTED 300 strip 1  ? pregabalin (LYRICA) 100 MG capsule Take 100 mg by mouth 3 (three) times daily.    ? rosuvastatin (CRESTOR) 40 MG tablet Take 1 tablet (40 mg total) by mouth daily. 90 tablet 3  ? traZODone (DESYREL) 100 MG tablet TAKE  1 TABLET BY MOUTH AT  BEDTIME (Patient taking differently: Take 100 mg by mouth at bedtime as needed for sleep.) 90 tablet 2  ? oxyCODONE-acetaminophen (PERCOCET/ROXICET) 5-325 MG tablet Take 1 tablet by mouth every 6 (six) hours as needed for severe pain. 10 tablet 0  ? ?No current facility-administered medications for this visit.  ? ? ? ROS:  See HPI ? ?Physical Exam: ? ?Vitals:  ? 05/22/21 1146  ?BP: 133/66  ?Pulse: 65  ?Resp: 18  ?Temp: 97.7 ?F (36.5 ?C)  ?TempSrc: Temporal  ?SpO2: 97%  ?Weight: 215 lb (97.5 kg)  ?Height: _0  (1.753 m)  ? ? ?Incision: Right groin and right leg incisions well-healed ?Extremities: Palpable right DP pulse; pitting edema to the level of the proximal shin ?Neuro: Alert and oriented ? ?Assessment/Plan:  This is a 66 y.o. male who is s/p: ?Right  femoral to above-the-knee popliteal bypass with vein ? ?-Right foot well-perfused with palpable DP pulse; bypass duplex demonstrates a widely patent bypass without any areas of hemodynamically significant stenosis ?-Encouraged patient to walk is much as possible.  When he is resting he should elevate his leg to manage edema symptoms ?-Encouraged continued smoking cessation ?-Continue aspirin and statin daily ?-Patient was prescribed #10 5/325 mg Percocet; he was also informed that this will be his last postoperative pain prescription ?-He is scheduled for bypass duplex in 6 months per protocol ? ? ?Dagoberto Ligas, PA-C ?[facility] ?[phone] ? ?Clinic MD:  Stanford Breed ? ?

## 2021-05-24 ENCOUNTER — Other Ambulatory Visit: Payer: Self-pay | Admitting: *Deleted

## 2021-05-24 DIAGNOSIS — I739 Peripheral vascular disease, unspecified: Secondary | ICD-10-CM

## 2021-05-24 DIAGNOSIS — I70213 Atherosclerosis of native arteries of extremities with intermittent claudication, bilateral legs: Secondary | ICD-10-CM

## 2021-05-31 ENCOUNTER — Other Ambulatory Visit: Payer: Self-pay | Admitting: Family Medicine

## 2021-06-14 DIAGNOSIS — E559 Vitamin D deficiency, unspecified: Secondary | ICD-10-CM | POA: Diagnosis not present

## 2021-06-14 DIAGNOSIS — I10 Essential (primary) hypertension: Secondary | ICD-10-CM | POA: Diagnosis not present

## 2021-06-14 DIAGNOSIS — J018 Other acute sinusitis: Secondary | ICD-10-CM | POA: Diagnosis not present

## 2021-06-14 DIAGNOSIS — Z79899 Other long term (current) drug therapy: Secondary | ICD-10-CM | POA: Diagnosis not present

## 2021-06-14 DIAGNOSIS — E7800 Pure hypercholesterolemia, unspecified: Secondary | ICD-10-CM | POA: Diagnosis not present

## 2021-06-18 DIAGNOSIS — Z79899 Other long term (current) drug therapy: Secondary | ICD-10-CM | POA: Diagnosis not present

## 2021-07-02 ENCOUNTER — Telehealth: Payer: Self-pay | Admitting: *Deleted

## 2021-07-02 NOTE — Telephone Encounter (Signed)
CALLED PATIENT TO INQUIRE IF HE WOULD BE WILLING TO HAVE A CT, LVM FOR A RETURN CALL ?

## 2021-07-12 DIAGNOSIS — G8929 Other chronic pain: Secondary | ICD-10-CM | POA: Diagnosis not present

## 2021-07-12 DIAGNOSIS — Z79899 Other long term (current) drug therapy: Secondary | ICD-10-CM | POA: Diagnosis not present

## 2021-07-12 DIAGNOSIS — I10 Essential (primary) hypertension: Secondary | ICD-10-CM | POA: Diagnosis not present

## 2021-07-12 DIAGNOSIS — R030 Elevated blood-pressure reading, without diagnosis of hypertension: Secondary | ICD-10-CM | POA: Diagnosis not present

## 2021-07-12 DIAGNOSIS — M545 Low back pain, unspecified: Secondary | ICD-10-CM | POA: Diagnosis not present

## 2021-07-12 DIAGNOSIS — J018 Other acute sinusitis: Secondary | ICD-10-CM | POA: Diagnosis not present

## 2021-08-14 DIAGNOSIS — I10 Essential (primary) hypertension: Secondary | ICD-10-CM | POA: Diagnosis not present

## 2021-08-14 DIAGNOSIS — J3089 Other allergic rhinitis: Secondary | ICD-10-CM | POA: Diagnosis not present

## 2021-08-14 DIAGNOSIS — G8929 Other chronic pain: Secondary | ICD-10-CM | POA: Diagnosis not present

## 2021-08-14 DIAGNOSIS — M545 Low back pain, unspecified: Secondary | ICD-10-CM | POA: Diagnosis not present

## 2021-08-14 DIAGNOSIS — J018 Other acute sinusitis: Secondary | ICD-10-CM | POA: Diagnosis not present

## 2021-08-14 DIAGNOSIS — R030 Elevated blood-pressure reading, without diagnosis of hypertension: Secondary | ICD-10-CM | POA: Diagnosis not present

## 2021-09-01 ENCOUNTER — Other Ambulatory Visit: Payer: Self-pay | Admitting: Cardiovascular Disease

## 2021-09-03 NOTE — Telephone Encounter (Signed)
Refill Request.  

## 2021-09-13 DIAGNOSIS — I10 Essential (primary) hypertension: Secondary | ICD-10-CM | POA: Diagnosis not present

## 2021-09-13 DIAGNOSIS — G8929 Other chronic pain: Secondary | ICD-10-CM | POA: Diagnosis not present

## 2021-09-13 DIAGNOSIS — R030 Elevated blood-pressure reading, without diagnosis of hypertension: Secondary | ICD-10-CM | POA: Diagnosis not present

## 2021-09-13 DIAGNOSIS — Z79899 Other long term (current) drug therapy: Secondary | ICD-10-CM | POA: Diagnosis not present

## 2021-09-13 DIAGNOSIS — J018 Other acute sinusitis: Secondary | ICD-10-CM | POA: Diagnosis not present

## 2021-09-13 DIAGNOSIS — M545 Low back pain, unspecified: Secondary | ICD-10-CM | POA: Diagnosis not present

## 2021-09-30 ENCOUNTER — Other Ambulatory Visit: Payer: Self-pay | Admitting: Family Medicine

## 2021-10-02 NOTE — Telephone Encounter (Signed)
please send to Dr Sandi Mariscal Requested Prescriptions  Refused Prescriptions Disp Refills  . losartan (COZAAR) 25 MG tablet [Pharmacy Med Name: Losartan Potassium 25 MG Oral Tablet] 100 tablet 2    Sig: TAKE 1 TABLET BY MOUTH DAILY     There is no refill protocol information for this order

## 2021-10-11 DIAGNOSIS — M792 Neuralgia and neuritis, unspecified: Secondary | ICD-10-CM | POA: Diagnosis not present

## 2021-10-11 DIAGNOSIS — G8929 Other chronic pain: Secondary | ICD-10-CM | POA: Diagnosis not present

## 2021-10-11 DIAGNOSIS — M545 Low back pain, unspecified: Secondary | ICD-10-CM | POA: Diagnosis not present

## 2021-10-11 DIAGNOSIS — E7800 Pure hypercholesterolemia, unspecified: Secondary | ICD-10-CM | POA: Diagnosis not present

## 2021-10-11 DIAGNOSIS — J018 Other acute sinusitis: Secondary | ICD-10-CM | POA: Diagnosis not present

## 2021-10-11 DIAGNOSIS — Z79899 Other long term (current) drug therapy: Secondary | ICD-10-CM | POA: Diagnosis not present

## 2021-10-11 DIAGNOSIS — E559 Vitamin D deficiency, unspecified: Secondary | ICD-10-CM | POA: Diagnosis not present

## 2021-10-11 DIAGNOSIS — R030 Elevated blood-pressure reading, without diagnosis of hypertension: Secondary | ICD-10-CM | POA: Diagnosis not present

## 2021-10-11 DIAGNOSIS — M79604 Pain in right leg: Secondary | ICD-10-CM | POA: Diagnosis not present

## 2021-10-11 DIAGNOSIS — I10 Essential (primary) hypertension: Secondary | ICD-10-CM | POA: Diagnosis not present

## 2021-10-11 DIAGNOSIS — R0989 Other specified symptoms and signs involving the circulatory and respiratory systems: Secondary | ICD-10-CM | POA: Diagnosis not present

## 2021-10-11 DIAGNOSIS — M79605 Pain in left leg: Secondary | ICD-10-CM | POA: Diagnosis not present

## 2021-11-12 ENCOUNTER — Other Ambulatory Visit: Payer: Self-pay | Admitting: Cardiovascular Disease

## 2021-11-13 DIAGNOSIS — M545 Low back pain, unspecified: Secondary | ICD-10-CM | POA: Diagnosis not present

## 2021-11-13 DIAGNOSIS — G8929 Other chronic pain: Secondary | ICD-10-CM | POA: Diagnosis not present

## 2021-11-13 DIAGNOSIS — R030 Elevated blood-pressure reading, without diagnosis of hypertension: Secondary | ICD-10-CM | POA: Diagnosis not present

## 2021-11-13 DIAGNOSIS — I739 Peripheral vascular disease, unspecified: Secondary | ICD-10-CM | POA: Diagnosis not present

## 2021-11-13 DIAGNOSIS — I10 Essential (primary) hypertension: Secondary | ICD-10-CM | POA: Diagnosis not present

## 2021-11-13 DIAGNOSIS — Z79899 Other long term (current) drug therapy: Secondary | ICD-10-CM | POA: Diagnosis not present

## 2021-11-13 DIAGNOSIS — M792 Neuralgia and neuritis, unspecified: Secondary | ICD-10-CM | POA: Diagnosis not present

## 2021-11-13 NOTE — Telephone Encounter (Signed)
Refill request

## 2021-11-29 ENCOUNTER — Other Ambulatory Visit: Payer: Self-pay | Admitting: *Deleted

## 2021-11-29 DIAGNOSIS — I739 Peripheral vascular disease, unspecified: Secondary | ICD-10-CM

## 2021-11-29 DIAGNOSIS — I70213 Atherosclerosis of native arteries of extremities with intermittent claudication, bilateral legs: Secondary | ICD-10-CM

## 2021-12-13 ENCOUNTER — Ambulatory Visit (HOSPITAL_COMMUNITY)
Admission: RE | Admit: 2021-12-13 | Discharge: 2021-12-13 | Disposition: A | Discharge status: Home / Self Care | Payer: Medicare Other | Source: Ambulatory Visit | Attending: Vascular Surgery | Admitting: Vascular Surgery

## 2021-12-13 ENCOUNTER — Ambulatory Visit: Payer: Medicare Other | Admitting: Physician Assistant

## 2021-12-13 ENCOUNTER — Ambulatory Visit (INDEPENDENT_AMBULATORY_CARE_PROVIDER_SITE_OTHER)
Admission: RE | Admit: 2021-12-13 | Discharge: 2021-12-13 | Disposition: A | Discharge status: Home / Self Care | Payer: Medicare Other | Source: Ambulatory Visit | Attending: Vascular Surgery | Admitting: Vascular Surgery

## 2021-12-13 VITALS — BP 140/71 | HR 51 | Temp 97.9°F | Resp 20 | Ht 69.0 in | Wt 245.4 lb

## 2021-12-13 DIAGNOSIS — R030 Elevated blood-pressure reading, without diagnosis of hypertension: Secondary | ICD-10-CM | POA: Diagnosis not present

## 2021-12-13 DIAGNOSIS — M7989 Other specified soft tissue disorders: Secondary | ICD-10-CM

## 2021-12-13 DIAGNOSIS — G8929 Other chronic pain: Secondary | ICD-10-CM | POA: Diagnosis not present

## 2021-12-13 DIAGNOSIS — I70213 Atherosclerosis of native arteries of extremities with intermittent claudication, bilateral legs: Secondary | ICD-10-CM | POA: Diagnosis not present

## 2021-12-13 DIAGNOSIS — I739 Peripheral vascular disease, unspecified: Secondary | ICD-10-CM | POA: Diagnosis not present

## 2021-12-13 DIAGNOSIS — M792 Neuralgia and neuritis, unspecified: Secondary | ICD-10-CM | POA: Diagnosis not present

## 2021-12-13 DIAGNOSIS — K219 Gastro-esophageal reflux disease without esophagitis: Secondary | ICD-10-CM | POA: Diagnosis not present

## 2021-12-13 DIAGNOSIS — I10 Essential (primary) hypertension: Secondary | ICD-10-CM | POA: Diagnosis not present

## 2021-12-13 DIAGNOSIS — M545 Low back pain, unspecified: Secondary | ICD-10-CM | POA: Diagnosis not present

## 2021-12-13 NOTE — Progress Notes (Signed)
Office Note     CC:  follow up Requesting Provider:  Sandi Mariscal, MD  HPI: Joshua [name] is a 66 y.o. (1955/12/18) male who presents for surveillance of PAD.  He has history of right femoral to above-the-knee popliteal bypass with vein by Dr. Trula Slade on 04/04/2021.  He previously underwent right common femoral endarterectomy on 12/28/2020 also by Dr. Trula Slade.  He also has history of aortobifemoral bypass at [place].  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  He has more discomfort associated with edema of the right leg since the bypass surgery.  He states he has been elevating his leg however this does not relieve the discomfort.  He is on aspirin and a statin daily.  He states he has quit smoking since surgery however there is a strong cigarette smell in the room.  He is accompanied by his wife.    Past Medical History:  Diagnosis Date   AC (acromioclavicular) joint bone spurs, unspecified laterality 11/25/2016   Arthritis    "back" (05/16/2015)   CAD in native artery, occluded Lcx and rotational atherectomy to LAD with DES 05/17/15 05/18/2015   Cavitary lesion of lung 02/25/2018   Chronic back pain    "all my back"   Chronic bronchitis (HCC)    COPD (chronic obstructive pulmonary disease) ([place])    Diabetes mellitus without complication (HCC)    GERD (gastroesophageal reflux disease)    H/O blood clots    "had them in my back; put filter in before one of my neck ORs"   History of gout    Hyperlipidemia    Hypertension    Neuromuscular disorder ([place])    PAD (peripheral artery disease) (HCC)    Phlebitis    Positive TB test    Seasonal allergies    Shortness of breath    Spinal disease     Past Surgical History:  Procedure Laterality Date   ABDOMINAL AORTOGRAM W/LOWER EXTREMITY  01/07/2018   ABDOMINAL AORTOGRAM W/LOWER EXTREMITY N/A 01/07/2018   Procedure: ABDOMINAL AORTOGRAM W/LOWER EXTREMITY;  Surgeon:  Wellington Hampshire, MD;  Location: [facility];  Service: Cardiovascular;  Laterality: N/A;   ABDOMINAL AORTOGRAM W/LOWER EXTREMITY N/A 12/27/2020   Procedure: ABDOMINAL AORTOGRAM W/LOWER EXTREMITY;  Surgeon: Wellington Hampshire, MD;  Location: [facility];  Service: Cardiovascular;  Laterality: N/A;   ABDOMINAL AORTOGRAM W/LOWER EXTREMITY N/A 04/03/2021   Procedure: ABDOMINAL AORTOGRAM W/LOWER EXTREMITY;  Surgeon: Serafina Mitchell, MD;  Location: [facility];  Service: Cardiovascular;  Laterality: N/A;   ANTERIOR CERVICAL DECOMP/DISCECTOMY FUSION  ~ 2001-2009 X 3   ANTERIOR CERVICAL DECOMP/DISCECTOMY FUSION N/A 10/06/2012   Procedure: ANTERIOR CERVICAL DECOMPRESSION/DISCECTOMY FUSION 1 LEVEL;  Surgeon: [name], MD;  Location: [facility];  Service: Neurosurgery;  Laterality: N/A;  C7T1 anterior cervical decompression with fusion plating and bonegraft    BACK SURGERY     BIOPSY  11/21/2020   Procedure: BIOPSY;  Surgeon: Mauri Pole, MD;  Location: [facility];  Service: Endoscopy;;   CARDIAC CATHETERIZATION  05/16/2015   CARDIAC CATHETERIZATION N/A 05/16/2015   Procedure: Left Heart Cath and Coronary Angiography;  Surgeon: [name], MD;  Location: [facility];  Service: Cardiovascular;  Laterality: N/A;   CARDIAC CATHETERIZATION N/A 05/16/2015   Procedure: Coronary Balloon Angioplasty;  Surgeon: [name], MD;  Location: [facility];  Service: Cardiovascular;  Laterality:  N/A;   CARDIAC CATHETERIZATION N/A 05/17/2015   Procedure: Coronary Stent Intervention Rotoblater;  Surgeon: [name], MD;  Location: [facility];  Service: Cardiovascular;  Laterality: N/A;   COLONOSCOPY WITH PROPOFOL N/A 11/21/2020   Procedure: COLONOSCOPY WITH PROPOFOL;  Surgeon: Mauri Pole, MD;  [facility];  Service: Endoscopy;  Laterality: N/A;   ELBOW SURGERY Left 2016   "had bone pieces removed"   ENDARTERECTOMY FEMORAL Right  12/28/2020   Procedure: RIGHT FEMORAL ARTERY ENDARTERECTOMY;  Surgeon: Serafina Mitchell, MD;  Location: [facility];  Service: Vascular;  Laterality: Right;   ESOPHAGEAL MANOMETRY N/A 12/30/2016   Procedure: ESOPHAGEAL MANOMETRY (EM);  Surgeon: Irene Shipper, MD;  [facility];  Service: Endoscopy;  Laterality: N/A;   ESOPHAGOGASTRODUODENOSCOPY (EGD) WITH PROPOFOL N/A 11/21/2020   Procedure: ESOPHAGOGASTRODUODENOSCOPY (EGD) WITH PROPOFOL;  Surgeon: Mauri Pole, MD;  [facility];  Service: Endoscopy;  Laterality: N/A;   EXCISIONAL HEMORRHOIDECTOMY     FEMORAL BYPASS  ~ 2010   FEMORAL-POPLITEAL BYPASS GRAFT Right 04/04/2021   Procedure: RIGHT FEMORAL-POPLITEAL ARTERY BYPASS GRAFT;  Surgeon: Serafina Mitchell, MD;  Location: [facility];  Service: Vascular;  Laterality: Right;   IR RADIOLOGIST EVAL & MGMT  12/17/2016   IVC FILTER PLACEMENT ([place] HX)  2010   "had blood clots in my back"   NECK SURGERY  2014   2008-2014   [facility] ANGIOPLASTY Right 12/28/2020   Procedure: PATCH ANGIOPLASTY USING;  Surgeon: Serafina Mitchell, MD;  Location: [place] [facility];  Service: Vascular;  Laterality: Right;   PERIPHERAL VASCULAR ATHERECTOMY  01/07/2018   Procedure: PERIPHERAL VASCULAR ATHERECTOMY;  Surgeon: Wellington Hampshire, MD;  Location: [facility];  Service: Cardiovascular;;   PERIPHERAL VASCULAR BALLOON ANGIOPLASTY  01/07/2018   Procedure: PERIPHERAL VASCULAR BALLOON ANGIOPLASTY;  Surgeon: Wellington Hampshire, MD;  Location: [facility];  Service: Cardiovascular;;   PERIPHERAL VASCULAR CATHETERIZATION N/A 02/14/2016   Procedure: Abdominal Aortogram w/Lower Extremity;  Surgeon: Wellington Hampshire, MD;  Location: [facility];  Service: Cardiovascular;  Laterality: N/A;   PERIPHERAL VASCULAR INTERVENTION Right 04/03/2021   Procedure: PERIPHERAL VASCULAR INTERVENTION;  Surgeon: Serafina Mitchell, MD;  Location: [facility];  Service: Cardiovascular;  Laterality: Right;   POLYPECTOMY   11/21/2020   Procedure: POLYPECTOMY;  Surgeon: Mauri Pole, MD;  [facility];  Service: Endoscopy;;   POSTERIOR LAMINECTOMY / DECOMPRESSION LUMBAR SPINE  2006   "bone spurs"   VIDEO BRONCHOSCOPY WITH ENDOBRONCHIAL NAVIGATION Right 02/25/2018   Procedure: VIDEO BRONCHOSCOPY WITH ENDOBRONCHIAL NAVIGATION;  Surgeon: Garner Nash, DO;  Location: [facility];  Service: Thoracic;  Laterality: Right;   VIDEO BRONCHOSCOPY WITH ENDOBRONCHIAL ULTRASOUND Right 02/25/2018   Procedure: VIDEO BRONCHOSCOPY WITH ENDOBRONCHIAL ULTRASOUND;  Surgeon: Garner Nash, DO;  [facility]: [facility];  Service: Thoracic;  Laterality: Right;    Social History   Socioeconomic History   Marital status: Married    Spouse name: Not on file   Number of children: Not on file   Years of education: 7   Highest education level: Not on file  Occupational History   Occupation: Disabled    Employer: DISABLED  Tobacco Use   Smoking status: Former    Packs/day: 0.50    Years: 49.00    Total pack years: 24.50    Types: Cigarettes    Quit date: 02/09/2021    Years since quitting: 0.8    Passive exposure: Current (Wife is  a Smoker)   Smokeless tobacco: Never   Tobacco comments:    half a pack a day   Vaping Use   Vaping Use: Never used  Substance and Sexual Activity   Alcohol use: Yes    Alcohol/week: 0.0 standard drinks of alcohol    Comment: 2 drinks a week   Drug use: No   Sexual activity: Not Currently  Other Topics Concern   Not on file  Social History Narrative   Regular exercise-no   Caffeine Use-yes   Lives with wife in a one story home. Has 2 children.  On disability for low back pain.  Used to work as a Games developer, lasted worked in 2007.     Education: 7th grade.   Social Determinants of Health   Financial Resource Strain: Not on file  Food Insecurity: Not on file  Transportation Needs: No Transportation Needs (04/02/2018)   PRAPARE - Hydrologist (Medical):  No    Lack of Transportation (Non-Medical): No  Physical Activity: Not on file  Stress: Not on file  Social Connections: Not on file  Intimate Partner Violence: Not on file    Family History  Problem Relation Age of Onset   Stomach cancer Mother        Deceased, 59s   Hypertension Mother    Hypertension Father    Stroke Father    Heart attack Father        Deceased, 41   Aneurysm Brother    Healthy Daughter    Healthy Maternal Grandmother    Leukemia Grandchild    Diabetes Neg Hx    Early death Neg Hx    Colon cancer Neg Hx    Pancreatic cancer Neg Hx     Current Outpatient Medications  Medication Sig Dispense Refill   acetaminophen (TYLENOL) 500 MG tablet Take 1,000 mg by mouth every 6 (six) hours as needed for mild pain.     albuterol (PROAIR HFA) 108 (90 Base) MCG/ACT inhaler USE 2 PUFFS EVERY 6 HOURS  AS NEEDED FOR WHEEZING OR  SHORTNESS OF BREATH (Patient taking differently: Inhale 2 puffs into the lungs every 6 (six) hours as needed for wheezing or shortness of breath.) 34 g 3   albuterol (PROVENTIL) (2.5 MG/3ML) 0.083% nebulizer solution Take 3 mLs (2.5 mg total) by nebulization every 6 (six) hours as needed for wheezing or shortness of breath. 75 mL 5   aspirin EC 81 MG tablet Take 81 mg by mouth daily.     Blood Glucose Monitoring Suppl (ONETOUCH VERIO) w/Device KIT 1 Device by Does not apply route once. 1 kit 0   Camphor (JOINTFLEX EX) Apply 1 application topically daily as needed (pain).     carvedilol (COREG) 12.5 MG tablet TAKE 1 TABLET BY MOUTH  TWICE DAILY WITH MEALS 60 tablet 0   colchicine 0.6 MG tablet TAKE 2 TABLETS BY MOUTH AS  DIRECTED THEN 1 TABLET  AFTER 1 HOUR. CAN TAKE 1  TABLET TWICE DAILY  THEREAFTER. (Patient taking differently: Take 0.6 mg by mouth 2 (two) times daily as needed (gout flare).) 60 tablet 11   DULoxetine (CYMBALTA) 60 MG capsule Take 1 capsule (60 mg total) by mouth daily. For Neuropathy 30 capsule 6   ezetimibe (ZETIA) 10 MG tablet  TAKE 1 TABLET BY MOUTH DAILY 100 tablet 0   Fluticasone-Umeclidin-Vilant (TRELEGY ELLIPTA) 100-62.5-25 MCG/INH AEPB Inhale 1 puff into the lungs daily. 2 each 5   methocarbamol (ROBAXIN) 500 MG tablet  Take 500 mg by mouth every 6 (six) hours as needed for muscle spasms.     omeprazole (PRILOSEC) 40 MG capsule TAKE 1 CAPSULE BY MOUTH DAILY 90 capsule 0   ONE TOUCH LANCETS MISC 1 each by Does not apply route 3 (three) times daily. 200 each 5   ONETOUCH ULTRA test strip USE AS DIRECTED 300 strip 1   oxyCODONE-acetaminophen (PERCOCET/ROXICET) 5-325 MG tablet Take 1 tablet by mouth every 6 (six) hours as needed for severe pain. 10 tablet 0   pregabalin (LYRICA) 100 MG capsule Take 100 mg by mouth 3 (three) times daily.     rosuvastatin (CRESTOR) 40 MG tablet TAKE 1 TABLET BY MOUTH DAILY 100 tablet 2   traZODone (DESYREL) 100 MG tablet TAKE 1 TABLET BY MOUTH AT  BEDTIME (Patient taking differently: Take 100 mg by mouth at bedtime as needed for sleep.) 90 tablet 2   No current facility-administered medications for this visit.    Allergies  Allergen Reactions   Lisinopril Hives and Itching   Amitriptyline Other (See Comments)    Makes pt feel "weird" and nausea.   Contrast Media [Iodinated Contrast Media] Hives and Itching    Pt can use if taking benadryl    Gadolinium Derivatives     Other reaction(s): Not available   Ibuprofen Other (See Comments)    Internal bleeding     REVIEW OF SYSTEMS:   '[X]'  denotes positive finding, '[ ]'  denotes negative finding Cardiac  Comments:  Chest pain or chest pressure:    Shortness of breath upon exertion:    Short of breath when lying flat:    Irregular heart rhythm:        Vascular    Pain in calf, thigh, or hip brought on by ambulation:    Pain in feet at night that wakes you up from your sleep:     Blood clot in your veins:    Leg swelling:         Pulmonary    Oxygen at home:    Productive cough:     Wheezing:         Neurologic    Sudden  weakness in arms or legs:     Sudden numbness in arms or legs:     Sudden onset of difficulty speaking or slurred speech:    Temporary loss of vision in one eye:     Problems with dizziness:         Gastrointestinal    Blood in stool:     Vomited blood:         Genitourinary    Burning when urinating:     Blood in urine:        Psychiatric    Major depression:         Hematologic    Bleeding problems:    Problems with blood clotting too easily:        Skin    Rashes or ulcers:        Constitutional    Fever or chills:      PHYSICAL EXAMINATION:  Vitals:   12/13/21 0842  BP: (!) 140/71  Pulse: (!) 51  Resp: 20  Temp: 97.9 F (36.6 C)  TempSrc: Temporal  SpO2: 96%  Weight: 245 lb 6.4 oz (111.3 kg)  Height: '5\' 9"'  (1.753 m)    General:  WDWN in NAD; vital signs documented above Gait: Not observed HENT: WNL, normocephalic Pulmonary: normal non-labored breathing , without Rales, rhonchi,  wheezing Cardiac: regular HR Abdomen: soft, NT, no masses Skin: without rashes Vascular Exam/Pulses:  Right Left  Radial 2+ (normal) 2+ (normal)  DP 2+ (normal) absent  PT 1+ (weak) absent   Extremities: without ischemic changes, without Gangrene , without cellulitis; without open wounds;  Musculoskeletal: no muscle wasting or atrophy  Neurologic: A&O X 3;  No focal weakness or paresthesias are detected Psychiatric:  The pt has Normal affect.   Non-Invasive Vascular Imaging:   Right leg bypass widely patent with multiphasic flow throughout the leg  ABI/TBIToday's ABIToday's TBIPrevious ABIPrevious TBI  +-------+-----------+-----------+------------+------------+  Right  1.03       0.63       1.19        0.49          +-------+-----------+-----------+------------+------------+  Left   0.60       0.53       [place]          0.37             ASSESSMENT/PLAN:: 66 y.o. male here for follow up for surveillance of PAD including right leg bypass  -Right leg  well-perfused with widely patent bypass by duplex.  He also has a palpable AT and PT pulses on exam. -He continues to have postoperative edema.  This is likely related to the use of the saphenous vein as his conduit.  He will continue to elevate his legs above the level of his heart periodically throughout the day.  We will measure and prescribe him a knee-high 15 to 20 mmHg compression sock to wear regularly during the day.  He will also try to avoid prolonged sitting and standing. -Patient also has claudication of the left lower extremity with calcific disease of the left SFA ostia as well as the popliteal artery above the knee.  He does not believe his claudication is negatively impacting his quality of life at this point.  He is aware he may require a revascularization procedure of the left lower extremity in the future.  He has a left ABI have 0.6 with a toe pressure of 98 mmHg. -Follow-up in another 6 months for bilateral lower extremity arterial duplex and ABIs. -Continue aspirin and statin daily.   Dagoberto Ligas, PA-C [facility] [phone]  Clinic MD:   Scot Dock

## 2021-12-18 ENCOUNTER — Other Ambulatory Visit: Payer: Self-pay

## 2021-12-18 DIAGNOSIS — I70213 Atherosclerosis of native arteries of extremities with intermittent claudication, bilateral legs: Secondary | ICD-10-CM

## 2021-12-18 DIAGNOSIS — I739 Peripheral vascular disease, unspecified: Secondary | ICD-10-CM

## 2021-12-19 ENCOUNTER — Other Ambulatory Visit: Payer: Self-pay | Admitting: Family Medicine

## 2022-01-10 DIAGNOSIS — M545 Low back pain, unspecified: Secondary | ICD-10-CM | POA: Diagnosis not present

## 2022-01-10 DIAGNOSIS — J449 Chronic obstructive pulmonary disease, unspecified: Secondary | ICD-10-CM | POA: Diagnosis not present

## 2022-01-10 DIAGNOSIS — M792 Neuralgia and neuritis, unspecified: Secondary | ICD-10-CM | POA: Diagnosis not present

## 2022-01-10 DIAGNOSIS — K219 Gastro-esophageal reflux disease without esophagitis: Secondary | ICD-10-CM | POA: Diagnosis not present

## 2022-01-10 DIAGNOSIS — R7309 Other abnormal glucose: Secondary | ICD-10-CM | POA: Diagnosis not present

## 2022-01-10 DIAGNOSIS — I10 Essential (primary) hypertension: Secondary | ICD-10-CM | POA: Diagnosis not present

## 2022-01-10 DIAGNOSIS — R030 Elevated blood-pressure reading, without diagnosis of hypertension: Secondary | ICD-10-CM | POA: Diagnosis not present

## 2022-01-10 DIAGNOSIS — G8929 Other chronic pain: Secondary | ICD-10-CM | POA: Diagnosis not present

## 2022-01-10 DIAGNOSIS — I739 Peripheral vascular disease, unspecified: Secondary | ICD-10-CM | POA: Diagnosis not present

## 2022-02-06 DIAGNOSIS — M792 Neuralgia and neuritis, unspecified: Secondary | ICD-10-CM | POA: Diagnosis not present

## 2022-02-06 DIAGNOSIS — K219 Gastro-esophageal reflux disease without esophagitis: Secondary | ICD-10-CM | POA: Diagnosis not present

## 2022-02-06 DIAGNOSIS — R030 Elevated blood-pressure reading, without diagnosis of hypertension: Secondary | ICD-10-CM | POA: Diagnosis not present

## 2022-02-06 DIAGNOSIS — R7309 Other abnormal glucose: Secondary | ICD-10-CM | POA: Diagnosis not present

## 2022-02-06 DIAGNOSIS — E559 Vitamin D deficiency, unspecified: Secondary | ICD-10-CM | POA: Diagnosis not present

## 2022-02-06 DIAGNOSIS — I10 Essential (primary) hypertension: Secondary | ICD-10-CM | POA: Diagnosis not present

## 2022-02-06 DIAGNOSIS — Z79899 Other long term (current) drug therapy: Secondary | ICD-10-CM | POA: Diagnosis not present

## 2022-02-06 DIAGNOSIS — M545 Low back pain, unspecified: Secondary | ICD-10-CM | POA: Diagnosis not present

## 2022-02-06 DIAGNOSIS — G8929 Other chronic pain: Secondary | ICD-10-CM | POA: Diagnosis not present

## 2022-02-06 DIAGNOSIS — I739 Peripheral vascular disease, unspecified: Secondary | ICD-10-CM | POA: Diagnosis not present

## 2022-02-06 DIAGNOSIS — J449 Chronic obstructive pulmonary disease, unspecified: Secondary | ICD-10-CM | POA: Diagnosis not present

## 2022-02-06 DIAGNOSIS — E7800 Pure hypercholesterolemia, unspecified: Secondary | ICD-10-CM | POA: Diagnosis not present

## 2022-02-15 ENCOUNTER — Other Ambulatory Visit: Payer: Self-pay | Admitting: Cardiovascular Disease

## 2022-03-05 DIAGNOSIS — R030 Elevated blood-pressure reading, without diagnosis of hypertension: Secondary | ICD-10-CM | POA: Diagnosis not present

## 2022-03-05 DIAGNOSIS — I739 Peripheral vascular disease, unspecified: Secondary | ICD-10-CM | POA: Diagnosis not present

## 2022-03-05 DIAGNOSIS — I10 Essential (primary) hypertension: Secondary | ICD-10-CM | POA: Diagnosis not present

## 2022-03-05 DIAGNOSIS — J449 Chronic obstructive pulmonary disease, unspecified: Secondary | ICD-10-CM | POA: Diagnosis not present

## 2022-03-05 DIAGNOSIS — M545 Low back pain, unspecified: Secondary | ICD-10-CM | POA: Diagnosis not present

## 2022-03-05 DIAGNOSIS — M792 Neuralgia and neuritis, unspecified: Secondary | ICD-10-CM | POA: Diagnosis not present

## 2022-03-05 DIAGNOSIS — G8929 Other chronic pain: Secondary | ICD-10-CM | POA: Diagnosis not present

## 2022-03-05 DIAGNOSIS — R7309 Other abnormal glucose: Secondary | ICD-10-CM | POA: Diagnosis not present

## 2022-03-05 DIAGNOSIS — K219 Gastro-esophageal reflux disease without esophagitis: Secondary | ICD-10-CM | POA: Diagnosis not present

## 2022-04-09 DIAGNOSIS — G8929 Other chronic pain: Secondary | ICD-10-CM | POA: Diagnosis not present

## 2022-04-09 DIAGNOSIS — Z0000 Encounter for general adult medical examination without abnormal findings: Secondary | ICD-10-CM | POA: Diagnosis not present

## 2022-04-09 DIAGNOSIS — K219 Gastro-esophageal reflux disease without esophagitis: Secondary | ICD-10-CM | POA: Diagnosis not present

## 2022-04-09 DIAGNOSIS — M792 Neuralgia and neuritis, unspecified: Secondary | ICD-10-CM | POA: Diagnosis not present

## 2022-04-09 DIAGNOSIS — I739 Peripheral vascular disease, unspecified: Secondary | ICD-10-CM | POA: Diagnosis not present

## 2022-04-09 DIAGNOSIS — M545 Low back pain, unspecified: Secondary | ICD-10-CM | POA: Diagnosis not present

## 2022-04-09 DIAGNOSIS — R7309 Other abnormal glucose: Secondary | ICD-10-CM | POA: Diagnosis not present

## 2022-04-09 DIAGNOSIS — R030 Elevated blood-pressure reading, without diagnosis of hypertension: Secondary | ICD-10-CM | POA: Diagnosis not present

## 2022-04-09 DIAGNOSIS — J449 Chronic obstructive pulmonary disease, unspecified: Secondary | ICD-10-CM | POA: Diagnosis not present

## 2022-04-09 DIAGNOSIS — I10 Essential (primary) hypertension: Secondary | ICD-10-CM | POA: Diagnosis not present

## 2022-04-26 ENCOUNTER — Other Ambulatory Visit: Payer: Self-pay | Admitting: Cardiovascular Disease

## 2022-04-29 ENCOUNTER — Encounter: Payer: Self-pay | Admitting: Radiation Oncology

## 2022-04-29 ENCOUNTER — Telehealth: Payer: Self-pay | Admitting: *Deleted

## 2022-04-29 NOTE — Telephone Encounter (Signed)
Called patient to ask if he would be interested in doing a CT, left message for a return call

## 2022-04-29 NOTE — Progress Notes (Signed)
The patient has a history of Stage IA3, cT1cN0M0 NSCLC, adenocarcinoma of the RUL. He was being followed in surveillance but has no showed multiple times. I've asked our scheduler to attempt again to reach the patient. It does not appear that he has any other future appointments in our health system scheduled or within what I can see in care everywhere.

## 2022-05-14 DIAGNOSIS — K219 Gastro-esophageal reflux disease without esophagitis: Secondary | ICD-10-CM | POA: Diagnosis not present

## 2022-05-14 DIAGNOSIS — Z79899 Other long term (current) drug therapy: Secondary | ICD-10-CM | POA: Diagnosis not present

## 2022-05-14 DIAGNOSIS — R030 Elevated blood-pressure reading, without diagnosis of hypertension: Secondary | ICD-10-CM | POA: Diagnosis not present

## 2022-05-14 DIAGNOSIS — M792 Neuralgia and neuritis, unspecified: Secondary | ICD-10-CM | POA: Diagnosis not present

## 2022-05-14 DIAGNOSIS — I739 Peripheral vascular disease, unspecified: Secondary | ICD-10-CM | POA: Diagnosis not present

## 2022-05-14 DIAGNOSIS — J449 Chronic obstructive pulmonary disease, unspecified: Secondary | ICD-10-CM | POA: Diagnosis not present

## 2022-05-14 DIAGNOSIS — E559 Vitamin D deficiency, unspecified: Secondary | ICD-10-CM | POA: Diagnosis not present

## 2022-05-14 DIAGNOSIS — M129 Arthropathy, unspecified: Secondary | ICD-10-CM | POA: Diagnosis not present

## 2022-05-14 DIAGNOSIS — Z131 Encounter for screening for diabetes mellitus: Secondary | ICD-10-CM | POA: Diagnosis not present

## 2022-05-14 DIAGNOSIS — G8929 Other chronic pain: Secondary | ICD-10-CM | POA: Diagnosis not present

## 2022-05-14 DIAGNOSIS — E7800 Pure hypercholesterolemia, unspecified: Secondary | ICD-10-CM | POA: Diagnosis not present

## 2022-05-14 DIAGNOSIS — M545 Low back pain, unspecified: Secondary | ICD-10-CM | POA: Diagnosis not present

## 2022-05-14 DIAGNOSIS — R7309 Other abnormal glucose: Secondary | ICD-10-CM | POA: Diagnosis not present

## 2022-05-14 DIAGNOSIS — I10 Essential (primary) hypertension: Secondary | ICD-10-CM | POA: Diagnosis not present

## 2022-05-17 ENCOUNTER — Other Ambulatory Visit: Payer: Self-pay | Admitting: Cardiovascular Disease

## 2022-05-20 NOTE — Telephone Encounter (Signed)
Refill request

## 2022-06-04 ENCOUNTER — Other Ambulatory Visit: Payer: Self-pay | Admitting: Cardiovascular Disease

## 2022-06-05 NOTE — Telephone Encounter (Signed)
Refill request

## 2022-06-10 ENCOUNTER — Telehealth: Payer: Self-pay

## 2022-06-10 DIAGNOSIS — I70213 Atherosclerosis of native arteries of extremities with intermittent claudication, bilateral legs: Secondary | ICD-10-CM

## 2022-06-10 DIAGNOSIS — I739 Peripheral vascular disease, unspecified: Secondary | ICD-10-CM

## 2022-06-10 NOTE — Telephone Encounter (Signed)
Pt called c/o RLE swelling and redness. His LLE is also intermittently numb.  Reviewed pt's chart, returned call for clarification, two identifiers used. Pt states that he's been sick for the past 2-3 days. He noticed that his entire RLE is red and tight. He denies any itching, pale/dark discoloration, cold feet. He has been wearing his compressions and elevating. Pt states swelling is improved overnight, but worsens throughout the day. Instructed him on proper elevation during the day. Pt has appt for PCP on 3/28. Pt given symptoms to monitor and will discuss cellulitis with PCP. Pt is due for 6 month f/u, so pt scheduled for those studies and PA requested at last OV. Appts had to be scheduled on 2 different days, pt aware, and understands.

## 2022-06-12 ENCOUNTER — Telehealth: Payer: Self-pay | Admitting: *Deleted

## 2022-06-12 ENCOUNTER — Ambulatory Visit (INDEPENDENT_AMBULATORY_CARE_PROVIDER_SITE_OTHER): Payer: Medicare Other | Admitting: Physician Assistant

## 2022-06-12 ENCOUNTER — Other Ambulatory Visit: Payer: Self-pay

## 2022-06-12 ENCOUNTER — Ambulatory Visit (INDEPENDENT_AMBULATORY_CARE_PROVIDER_SITE_OTHER)
Admission: RE | Admit: 2022-06-12 | Discharge: 2022-06-12 | Disposition: A | Discharge status: Home / Self Care | Payer: Medicare Other | Source: Ambulatory Visit | Attending: Vascular Surgery | Admitting: Vascular Surgery

## 2022-06-12 ENCOUNTER — Emergency Department (HOSPITAL_COMMUNITY): Payer: Medicare Other

## 2022-06-12 ENCOUNTER — Encounter (HOSPITAL_COMMUNITY): Payer: Self-pay

## 2022-06-12 ENCOUNTER — Inpatient Hospital Stay (HOSPITAL_COMMUNITY): Payer: Medicare Other

## 2022-06-12 ENCOUNTER — Inpatient Hospital Stay (HOSPITAL_COMMUNITY)
Admission: EM | Admit: 2022-06-12 | Discharge: 2022-06-20 | DRG: 603 | Disposition: A | Discharge status: Home / Self Care | Payer: Medicare Other | Attending: Internal Medicine | Admitting: Internal Medicine

## 2022-06-12 ENCOUNTER — Emergency Department (HOSPITAL_BASED_OUTPATIENT_CLINIC_OR_DEPARTMENT_OTHER): Payer: Medicare Other

## 2022-06-12 VITALS — BP 140/76 | HR 58 | Temp 97.9°F | Resp 18 | Ht 69.0 in | Wt 236.0 lb

## 2022-06-12 DIAGNOSIS — Z6834 Body mass index (BMI) 34.0-34.9, adult: Secondary | ICD-10-CM

## 2022-06-12 DIAGNOSIS — Z7982 Long term (current) use of aspirin: Secondary | ICD-10-CM

## 2022-06-12 DIAGNOSIS — Z981 Arthrodesis status: Secondary | ICD-10-CM

## 2022-06-12 DIAGNOSIS — Z886 Allergy status to analgesic agent status: Secondary | ICD-10-CM

## 2022-06-12 DIAGNOSIS — Z955 Presence of coronary angioplasty implant and graft: Secondary | ICD-10-CM

## 2022-06-12 DIAGNOSIS — K219 Gastro-esophageal reflux disease without esophagitis: Secondary | ICD-10-CM | POA: Diagnosis not present

## 2022-06-12 DIAGNOSIS — I129 Hypertensive chronic kidney disease with stage 1 through stage 4 chronic kidney disease, or unspecified chronic kidney disease: Secondary | ICD-10-CM | POA: Diagnosis present

## 2022-06-12 DIAGNOSIS — Z8249 Family history of ischemic heart disease and other diseases of the circulatory system: Secondary | ICD-10-CM

## 2022-06-12 DIAGNOSIS — I70213 Atherosclerosis of native arteries of extremities with intermittent claudication, bilateral legs: Secondary | ICD-10-CM

## 2022-06-12 DIAGNOSIS — Z91041 Radiographic dye allergy status: Secondary | ICD-10-CM | POA: Diagnosis not present

## 2022-06-12 DIAGNOSIS — E1122 Type 2 diabetes mellitus with diabetic chronic kidney disease: Secondary | ICD-10-CM | POA: Diagnosis not present

## 2022-06-12 DIAGNOSIS — L03115 Cellulitis of right lower limb: Secondary | ICD-10-CM

## 2022-06-12 DIAGNOSIS — N179 Acute kidney failure, unspecified: Secondary | ICD-10-CM | POA: Diagnosis present

## 2022-06-12 DIAGNOSIS — I10 Essential (primary) hypertension: Secondary | ICD-10-CM | POA: Diagnosis present

## 2022-06-12 DIAGNOSIS — E871 Hypo-osmolality and hyponatremia: Secondary | ICD-10-CM | POA: Diagnosis not present

## 2022-06-12 DIAGNOSIS — I739 Peripheral vascular disease, unspecified: Secondary | ICD-10-CM | POA: Diagnosis present

## 2022-06-12 DIAGNOSIS — E1151 Type 2 diabetes mellitus with diabetic peripheral angiopathy without gangrene: Secondary | ICD-10-CM | POA: Diagnosis not present

## 2022-06-12 DIAGNOSIS — J4489 Other specified chronic obstructive pulmonary disease: Secondary | ICD-10-CM | POA: Diagnosis present

## 2022-06-12 DIAGNOSIS — R52 Pain, unspecified: Secondary | ICD-10-CM

## 2022-06-12 DIAGNOSIS — F17210 Nicotine dependence, cigarettes, uncomplicated: Secondary | ICD-10-CM | POA: Diagnosis present

## 2022-06-12 DIAGNOSIS — M109 Gout, unspecified: Secondary | ICD-10-CM | POA: Diagnosis not present

## 2022-06-12 DIAGNOSIS — J449 Chronic obstructive pulmonary disease, unspecified: Secondary | ICD-10-CM | POA: Diagnosis not present

## 2022-06-12 DIAGNOSIS — Z79899 Other long term (current) drug therapy: Secondary | ICD-10-CM | POA: Diagnosis not present

## 2022-06-12 DIAGNOSIS — E785 Hyperlipidemia, unspecified: Secondary | ICD-10-CM | POA: Diagnosis not present

## 2022-06-12 DIAGNOSIS — E782 Mixed hyperlipidemia: Secondary | ICD-10-CM | POA: Diagnosis not present

## 2022-06-12 DIAGNOSIS — M549 Dorsalgia, unspecified: Secondary | ICD-10-CM | POA: Diagnosis not present

## 2022-06-12 DIAGNOSIS — E1142 Type 2 diabetes mellitus with diabetic polyneuropathy: Secondary | ICD-10-CM | POA: Diagnosis not present

## 2022-06-12 DIAGNOSIS — G8929 Other chronic pain: Secondary | ICD-10-CM | POA: Diagnosis present

## 2022-06-12 DIAGNOSIS — R609 Edema, unspecified: Secondary | ICD-10-CM

## 2022-06-12 DIAGNOSIS — L0390 Cellulitis, unspecified: Secondary | ICD-10-CM | POA: Diagnosis present

## 2022-06-12 DIAGNOSIS — Z720 Tobacco use: Secondary | ICD-10-CM | POA: Diagnosis present

## 2022-06-12 DIAGNOSIS — E669 Obesity, unspecified: Secondary | ICD-10-CM | POA: Diagnosis present

## 2022-06-12 DIAGNOSIS — N182 Chronic kidney disease, stage 2 (mild): Secondary | ICD-10-CM | POA: Diagnosis not present

## 2022-06-12 DIAGNOSIS — Z806 Family history of leukemia: Secondary | ICD-10-CM

## 2022-06-12 DIAGNOSIS — M7989 Other specified soft tissue disorders: Secondary | ICD-10-CM | POA: Diagnosis not present

## 2022-06-12 DIAGNOSIS — E876 Hypokalemia: Secondary | ICD-10-CM | POA: Diagnosis present

## 2022-06-12 DIAGNOSIS — M79604 Pain in right leg: Secondary | ICD-10-CM | POA: Diagnosis not present

## 2022-06-12 DIAGNOSIS — I480 Paroxysmal atrial fibrillation: Secondary | ICD-10-CM | POA: Diagnosis present

## 2022-06-12 DIAGNOSIS — I2510 Atherosclerotic heart disease of native coronary artery without angina pectoris: Secondary | ICD-10-CM | POA: Diagnosis present

## 2022-06-12 DIAGNOSIS — Z888 Allergy status to other drugs, medicaments and biological substances status: Secondary | ICD-10-CM | POA: Diagnosis not present

## 2022-06-12 DIAGNOSIS — Z8672 Personal history of thrombophlebitis: Secondary | ICD-10-CM

## 2022-06-12 DIAGNOSIS — Z800 Family history of malignant neoplasm of digestive organs: Secondary | ICD-10-CM

## 2022-06-12 DIAGNOSIS — Z823 Family history of stroke: Secondary | ICD-10-CM

## 2022-06-12 DIAGNOSIS — Z7951 Long term (current) use of inhaled steroids: Secondary | ICD-10-CM

## 2022-06-12 LAB — CK: Total CK: 48 U/L — ABNORMAL LOW (ref 49–397)

## 2022-06-12 LAB — RAPID URINE DRUG SCREEN, HOSP PERFORMED
Amphetamines: NOT DETECTED
Barbiturates: NOT DETECTED
Benzodiazepines: NOT DETECTED
Cocaine: NOT DETECTED
Opiates: POSITIVE — AB
Tetrahydrocannabinol: POSITIVE — AB

## 2022-06-12 LAB — CBC WITH DIFFERENTIAL/PLATELET
Abs Immature Granulocytes: 0.07 10*3/uL (ref 0.00–0.07)
Basophils Absolute: 0 10*3/uL (ref 0.0–0.1)
Basophils Relative: 0 %
Eosinophils Absolute: 0.1 10*3/uL (ref 0.0–0.5)
Eosinophils Relative: 1 %
HCT: 41.8 % (ref 39.0–52.0)
Hemoglobin: 14.2 g/dL (ref 13.0–17.0)
Immature Granulocytes: 0 %
Lymphocytes Relative: 7 %
Lymphs Abs: 1.2 10*3/uL (ref 0.7–4.0)
MCH: 30.3 pg (ref 26.0–34.0)
MCHC: 34 g/dL (ref 30.0–36.0)
MCV: 89.3 fL (ref 80.0–100.0)
Monocytes Absolute: 1.2 10*3/uL — ABNORMAL HIGH (ref 0.1–1.0)
Monocytes Relative: 7 %
Neutro Abs: 14.2 10*3/uL — ABNORMAL HIGH (ref 1.7–7.7)
Neutrophils Relative %: 85 %
Platelets: 158 10*3/uL (ref 150–400)
RBC: 4.68 MIL/uL (ref 4.22–5.81)
RDW: 15.8 % — ABNORMAL HIGH (ref 11.5–15.5)
WBC: 16.8 10*3/uL — ABNORMAL HIGH (ref 4.0–10.5)
nRBC: 0 % (ref 0.0–0.2)

## 2022-06-12 LAB — COMPREHENSIVE METABOLIC PANEL
ALT: 11 U/L (ref 0–44)
AST: 21 U/L (ref 15–41)
Albumin: 2.3 g/dL — ABNORMAL LOW (ref 3.5–5.0)
Alkaline Phosphatase: 137 U/L — ABNORMAL HIGH (ref 38–126)
Anion gap: 11 (ref 5–15)
BUN: 29 mg/dL — ABNORMAL HIGH (ref 8–23)
CO2: 25 mmol/L (ref 22–32)
Calcium: 8.3 mg/dL — ABNORMAL LOW (ref 8.9–10.3)
Chloride: 96 mmol/L — ABNORMAL LOW (ref 98–111)
Creatinine, Ser: 1.56 mg/dL — ABNORMAL HIGH (ref 0.61–1.24)
GFR, Estimated: 49 mL/min — ABNORMAL LOW (ref >60)
Glucose, Bld: 127 mg/dL — ABNORMAL HIGH (ref 70–99)
Potassium: 3.9 mmol/L (ref 3.5–5.1)
Sodium: 132 mmol/L — ABNORMAL LOW (ref 135–145)
Total Bilirubin: 0.8 mg/dL (ref 0.3–1.2)
Total Protein: 5.8 g/dL — ABNORMAL LOW (ref 6.5–8.1)

## 2022-06-12 LAB — OSMOLALITY, URINE: Osmolality, Ur: 249 mOsm/kg — ABNORMAL LOW (ref 300–900)

## 2022-06-12 LAB — BASIC METABOLIC PANEL
Anion gap: 13 (ref 5–15)
BUN: 24 mg/dL — ABNORMAL HIGH (ref 8–23)
CO2: 25 mmol/L (ref 22–32)
Calcium: 7.8 mg/dL — ABNORMAL LOW (ref 8.9–10.3)
Chloride: 97 mmol/L — ABNORMAL LOW (ref 98–111)
Creatinine, Ser: 1.32 mg/dL — ABNORMAL HIGH (ref 0.61–1.24)
GFR, Estimated: 59 mL/min — ABNORMAL LOW (ref >60)
Glucose, Bld: 93 mg/dL (ref 70–99)
Potassium: 3.4 mmol/L — ABNORMAL LOW (ref 3.5–5.1)
Sodium: 135 mmol/L (ref 135–145)

## 2022-06-12 LAB — MAGNESIUM: Magnesium: 1.8 mg/dL (ref 1.7–2.4)

## 2022-06-12 LAB — URINALYSIS, ROUTINE W REFLEX MICROSCOPIC
Bilirubin Urine: NEGATIVE
Glucose, UA: NEGATIVE mg/dL
Ketones, ur: NEGATIVE mg/dL
Leukocytes,Ua: NEGATIVE
Nitrite: NEGATIVE
Protein, ur: >=300 mg/dL — AB
Specific Gravity, Urine: 1.015 (ref 1.005–1.030)
pH: 5 (ref 5.0–8.0)

## 2022-06-12 LAB — LACTIC ACID, PLASMA
Lactic Acid, Venous: 1.1 mmol/L (ref 0.5–1.9)
Lactic Acid, Venous: 1.4 mmol/L (ref 0.5–1.9)

## 2022-06-12 LAB — TSH: TSH: 1.901 u[IU]/mL (ref 0.350–4.500)

## 2022-06-12 LAB — SODIUM, URINE, RANDOM: Sodium, Ur: <10 mmol/L

## 2022-06-12 LAB — PHOSPHORUS: Phosphorus: 3.6 mg/dL (ref 2.5–4.6)

## 2022-06-12 LAB — GLUCOSE, CAPILLARY: Glucose-Capillary: 129 mg/dL — ABNORMAL HIGH (ref 70–99)

## 2022-06-12 LAB — PROTIME-INR
INR: 0.9 (ref 0.8–1.2)
Prothrombin Time: 12.5 seconds (ref 11.4–15.2)

## 2022-06-12 LAB — CREATININE, URINE, RANDOM: Creatinine, Urine: 74 mg/dL

## 2022-06-12 MED ORDER — PANTOPRAZOLE SODIUM 40 MG PO TBEC
40.0000 mg | DELAYED_RELEASE_TABLET | Freq: Every day | ORAL | Status: DC
  Administered 2022-06-13 – 2022-06-20 (×8): 40 mg via ORAL
  Filled 2022-06-12 (×8): qty 1

## 2022-06-12 MED ORDER — LACTATED RINGERS IV BOLUS
1000.0000 mL | Freq: Once | INTRAVENOUS | Status: AC
  Administered 2022-06-12: 1000 mL via INTRAVENOUS

## 2022-06-12 MED ORDER — UMECLIDINIUM BROMIDE 62.5 MCG/ACT IN AEPB
1.0000 | INHALATION_SPRAY | Freq: Every day | RESPIRATORY_TRACT | Status: DC
  Administered 2022-06-14 – 2022-06-20 (×7): 1 via RESPIRATORY_TRACT
  Filled 2022-06-12 (×2): qty 7

## 2022-06-12 MED ORDER — CARVEDILOL 12.5 MG PO TABS
12.5000 mg | ORAL_TABLET | Freq: Two times a day (BID) | ORAL | Status: DC
  Administered 2022-06-12 – 2022-06-20 (×16): 12.5 mg via ORAL
  Filled 2022-06-12 (×16): qty 1

## 2022-06-12 MED ORDER — METHOCARBAMOL 1000 MG/10ML IJ SOLN
500.0000 mg | Freq: Four times a day (QID) | INTRAVENOUS | Status: DC | PRN
  Administered 2022-06-14: 500 mg via INTRAVENOUS
  Filled 2022-06-12: qty 500

## 2022-06-12 MED ORDER — ACETAMINOPHEN 325 MG PO TABS
650.0000 mg | ORAL_TABLET | Freq: Four times a day (QID) | ORAL | Status: DC | PRN
  Administered 2022-06-16: 650 mg via ORAL
  Filled 2022-06-12: qty 2

## 2022-06-12 MED ORDER — ROSUVASTATIN CALCIUM 20 MG PO TABS
40.0000 mg | ORAL_TABLET | Freq: Every evening | ORAL | Status: DC
  Administered 2022-06-12 – 2022-06-19 (×8): 40 mg via ORAL
  Filled 2022-06-12 (×8): qty 2

## 2022-06-12 MED ORDER — EZETIMIBE 10 MG PO TABS
10.0000 mg | ORAL_TABLET | Freq: Every evening | ORAL | Status: DC
  Administered 2022-06-12 – 2022-06-19 (×8): 10 mg via ORAL
  Filled 2022-06-12 (×8): qty 1

## 2022-06-12 MED ORDER — POTASSIUM CHLORIDE CRYS ER 20 MEQ PO TBCR
40.0000 meq | EXTENDED_RELEASE_TABLET | Freq: Once | ORAL | Status: AC
  Administered 2022-06-12: 40 meq via ORAL
  Filled 2022-06-12: qty 2

## 2022-06-12 MED ORDER — TRAZODONE HCL 50 MG PO TABS
100.0000 mg | ORAL_TABLET | Freq: Every day | ORAL | Status: DC
  Administered 2022-06-12 – 2022-06-19 (×8): 100 mg via ORAL
  Filled 2022-06-12 (×8): qty 2

## 2022-06-12 MED ORDER — SODIUM CHLORIDE 0.9 % IV SOLN: INTRAVENOUS | Status: DC

## 2022-06-12 MED ORDER — ONDANSETRON HCL 4 MG PO TABS: 4.0000 mg | ORAL_TABLET | Freq: Four times a day (QID) | ORAL | Status: DC | PRN

## 2022-06-12 MED ORDER — FLUTICASONE FUROATE-VILANTEROL 200-25 MCG/ACT IN AEPB
1.0000 | INHALATION_SPRAY | Freq: Every day | RESPIRATORY_TRACT | Status: DC
  Administered 2022-06-14 – 2022-06-20 (×7): 1 via RESPIRATORY_TRACT
  Filled 2022-06-12 (×2): qty 28

## 2022-06-12 MED ORDER — VANCOMYCIN HCL 1250 MG/250ML IV SOLN
1250.0000 mg | INTRAVENOUS | Status: DC
  Administered 2022-06-13: 1250 mg via INTRAVENOUS
  Filled 2022-06-12 (×2): qty 250

## 2022-06-12 MED ORDER — INSULIN ASPART 100 UNIT/ML IJ SOLN: 0.0000 [IU] | INTRAMUSCULAR | Status: DC

## 2022-06-12 MED ORDER — ALBUTEROL SULFATE (2.5 MG/3ML) 0.083% IN NEBU: 3.0000 mL | INHALATION_SOLUTION | Freq: Four times a day (QID) | RESPIRATORY_TRACT | Status: DC | PRN

## 2022-06-12 MED ORDER — PREGABALIN 100 MG PO CAPS
100.0000 mg | ORAL_CAPSULE | Freq: Three times a day (TID) | ORAL | Status: DC
  Administered 2022-06-12 – 2022-06-20 (×23): 100 mg via ORAL
  Filled 2022-06-12 (×23): qty 1

## 2022-06-12 MED ORDER — SODIUM CHLORIDE 0.9 % IV SOLN
2.0000 g | Freq: Two times a day (BID) | INTRAVENOUS | Status: DC
  Administered 2022-06-12 – 2022-06-13 (×2): 2 g via INTRAVENOUS
  Filled 2022-06-12 (×2): qty 12.5

## 2022-06-12 MED ORDER — FENTANYL CITRATE PF 50 MCG/ML IJ SOSY
12.5000 ug | PREFILLED_SYRINGE | INTRAMUSCULAR | Status: DC | PRN
  Administered 2022-06-12 – 2022-06-14 (×10): 50 ug via INTRAVENOUS
  Administered 2022-06-14: 25 ug via INTRAVENOUS
  Administered 2022-06-14 – 2022-06-20 (×25): 50 ug via INTRAVENOUS
  Filled 2022-06-12 (×36): qty 1

## 2022-06-12 MED ORDER — ONDANSETRON HCL 4 MG/2ML IJ SOLN: 4.0000 mg | Freq: Four times a day (QID) | INTRAMUSCULAR | Status: DC | PRN

## 2022-06-12 MED ORDER — ACETAMINOPHEN 650 MG RE SUPP: 650.0000 mg | Freq: Four times a day (QID) | RECTAL | Status: DC | PRN

## 2022-06-12 MED ORDER — VANCOMYCIN HCL 10 G IV SOLR
2500.0000 mg | Freq: Once | INTRAVENOUS | Status: AC
  Administered 2022-06-12: 2500 mg via INTRAVENOUS
  Filled 2022-06-12: qty 2500

## 2022-06-12 MED ORDER — SODIUM CHLORIDE 0.9 % IV SOLN
1.0000 g | Freq: Once | INTRAVENOUS | Status: AC
  Administered 2022-06-12: 1 g via INTRAVENOUS
  Filled 2022-06-12: qty 10

## 2022-06-12 MED ORDER — HYDROCODONE-ACETAMINOPHEN 5-325 MG PO TABS
1.0000 | ORAL_TABLET | ORAL | Status: DC | PRN
  Administered 2022-06-13 – 2022-06-16 (×5): 2 via ORAL
  Filled 2022-06-12 (×5): qty 2

## 2022-06-12 NOTE — Subjective & Objective (Signed)
Patient with known peripheral vascular disease status post revascularization presents with worsening redness and swelling of his right leg. Status post right femoral to above-the-knee popliteal bypass with vein by Dr. Trula Slade done in January 2023 He will also in the past needed right common femoral endarterectomy by Dr. Trula Slade done in October 2022 And past history aortobifemoral bypass and ECU done He has chronic lower extremity edema for which she has to elevate his legs and wear compression stockings But has been having 3-day history of 16 redness swelling and pain of his right leg That has been getting progressively worse over the past few days he is unable to put pressure on his right leg due to pain and swelling no wounds stone no fevers or chills

## 2022-06-12 NOTE — ED Triage Notes (Signed)
Pt BIB [facility] for Rt leg pain/swelling/redness that started since Saturday (06/08/22). Has lost weight & has had chills, denies fevers, 10/10 pain.

## 2022-06-12 NOTE — Telephone Encounter (Signed)
Patient was able to be seen in office by our PA today for evaluation and was sent to ER for evaluation.

## 2022-06-12 NOTE — Assessment & Plan Note (Signed)
Continue Protonix °

## 2022-06-12 NOTE — Assessment & Plan Note (Signed)
Chronic stable hold aspirin continue statin Crestor 40 mg daily and Zetia 10 mg a day continue Coreg 12.5 mg twice daily

## 2022-06-12 NOTE — H&P (Signed)
Joshua [name] [id number] DOB: 01/12/1956 DOA: 06/12/2022     PCP: Sandi Mariscal, MD   Outpatient Specialists:   CARDS:   Dr. Fletcher [name]  Pulmonology Dr Valeta Harms  Vascular Dr. Carloyn Jaeger Patient arrived to ER on 06/12/22 at 1254 Referred by Attending Toy Baker, MD   Patient coming from:    home Lives With family    Chief Complaint:   Chief Complaint  Patient presents with   Sent by PCP   Leg Pain    HPI: Joshua [name] is a 67 y.o. male with medical history significant of PAD, CAd, COPD, DM2, GERD, gout, HLD, HTN. Afib not on anticoagulation due to hx of GI bleed    Presented with worsening right lower extremity edema and swelling and pain Patient with known peripheral vascular disease status post revascularization presents with worsening redness and swelling of his right leg. Status post right femoral to above-the-knee popliteal bypass with vein by Dr. Trula Slade done in January 2023 He will also in the past needed right common femoral endarterectomy by Dr. Trula Slade done in October 2022 And past history aortobifemoral bypass and ECU done He has chronic lower extremity edema for which she has to elevate his legs and wear compression stockings But has been having 3-day history of 16 redness swelling and pain of his right leg That has been getting progressively worse over the past few days he is unable to put pressure on his right leg due to pain and swelling no wounds stone no fevers or chills   ABIs done today showing patent graft he was seen by vascular surgery who felt that the groin incision has been healing well recommended to keep use lower extremity elevated patient was recommended to be admitted for IV antibiotics  Pt still smokes and does not wish to quit, does not drink ETOH    Lab Results  Component Value Date   [name] 04/03/2021   [name] NEGATIVE 12/27/2020   [name] NEGATIVE 11/14/2020    Regarding pertinent Chronic  problems:     Hyperlipidemia -  on statins Crestor Lipid Panel     Component Value Date/Time   CHOL 104 04/05/2021 0320   CHOL 215 (H) 11/21/2017 0810   TRIG 58 04/05/2021 0320   HDL 59 04/05/2021 0320   HDL 126 11/21/2017 0810   CHOLHDL 1.8 04/05/2021 0320   VLDL 12 04/05/2021 0320   LDLCALC 33 04/05/2021 0320   LDLCALC 75 07/09/2019 1435   LDLDIRECT 107.0 04/21/2015 1352   LABVLDL 19 11/21/2017 0810     HTN on Coreg     CAD  - On  statin, betablocker,                  - followed by cardiology             status post rotational atherectomy and drug-eluting stent placement to the mid LAD.     DM 2 -  Lab Results  Component Value Date   HGBA1C 5.8 (H) 04/05/2021   diet controlled    obesity-   BMI Readings from Last 1 Encounters:  06/12/22 34.85 kg/m    COPD -  followed by pulmonology   not  on baseline oxygen         A. Fib -  transient - CHA2DS2 vas score  4      Not on anticoagulation secondary to Risk of Falls,   recurrent bleeding         -  Rate control:  Currently controlled with  Coreg        Gi bleed - He had EGD and colonoscopy and was told about stomach ulcers and colon polyps which were removed         While in ER: Clinical Course as of 06/12/22 1938  Wed Jun 12, 2022  1552 Creatinine(!): 1.56 Baseline 0.89 [RP]  1829 Dr Roel Cluck to admit.  [RP]    Clinical Course User Index [RP] [name], MD       Lab Orders         Blood culture (routine x 2)         Lactic acid, plasma         Comprehensive metabolic panel         CBC with Differential         Urinalysis, Routine w reflex microscopic -Urine, Clean Catch        Right Femur - non acute Right tib/fib  Diffuse soft tissue swelling. No acute bony abnormalities.   Dopplers negative for DVT CXR -  NON acute  Following Medications were ordered in ER: Medications  vancomycin (VANCOCIN) 2,500 mg in sodium chloride 0.9 % 500 mL IVPB (2,500 mg Intravenous New Bag/Given 06/12/22  1743)  vancomycin (VANCOREADY) IVPB 1250 mg/250 mL (has no administration in time range)  cefTRIAXone (ROCEPHIN) 1 g in sodium chloride 0.9 % 100 mL IVPB (0 g Intravenous Stopped 06/12/22 1756)  lactated ringers bolus 1,000 mL (0 mLs Intravenous Stopped 06/12/22 1836)    _______________________________________________________ ER Provider Called:    Vascular They Recommend admit to medicine   Will see in AM      ED Triage Vitals  Enc Vitals Group     BP 06/12/22 1300 (!) 165/88     Pulse Rate 06/12/22 1300 64     Resp 06/12/22 1300 (!) 24     Temp 06/12/22 1300 98.8 F (37.1 C)     Temp Source 06/12/22 1626 Oral     SpO2 06/12/22 1300 97 %     Weight --      Height --      Head Circumference --      Peak Flow --      Pain Score 06/12/22 1312 10     Pain Loc --      Pain Edu? --      Excl. in [place]? --   TMAX(24)@     _________________________________________ Significant initial  Findings: Abnormal Labs Reviewed  COMPREHENSIVE METABOLIC PANEL - Abnormal; Notable for the following components:      Result Value   Sodium 132 (*)    Chloride 96 (*)    Glucose, Bld 127 (*)    BUN 29 (*)    Creatinine, Ser 1.56 (*)    Calcium 8.3 (*)    Total Protein 5.8 (*)    Albumin 2.3 (*)    Alkaline Phosphatase 137 (*)    GFR, Estimated 49 (*)    All other components within normal limits  CBC WITH DIFFERENTIAL/PLATELET - Abnormal; Notable for the following components:   WBC 16.8 (*)    RDW 15.8 (*)    Neutro Abs 14.2 (*)    Monocytes Absolute 1.2 (*)    All other components within normal limits  URINALYSIS, ROUTINE W REFLEX MICROSCOPIC - Abnormal; Notable for the following components:   APPearance HAZY (*)    Hgb urine dipstick SMALL (*)    Protein, ur >=300 (*)  Bacteria, UA RARE (*)    All other components within normal limits        Cardiac Panel (last 3 results) Recent Labs    06/12/22 1955  CKTOTAL 48*     ECG: Ordered    COVID-19 Labs  No results for input(s):  "DDIMER", "FERRITIN", "LDH", "CRP" in the last 72 hours.  Lab Results  Component Value Date   SARSCOV2NAA NEGATIVE 04/03/2021   [place] NEGATIVE 12/27/2020   [place] NEGATIVE 11/14/2020      The recent clinical data is shown below. Vitals:   06/12/22 1300 06/12/22 1626 06/12/22 1630 06/12/22 1910  BP: (!) 165/88 (!) 153/73 (!) 158/76 (!) 178/55  Pulse: 64 (!) 56 (!) 56 (!) 56  Resp: (!) 24 19  16   Temp: 98.8 F (37.1 C) 98 F (36.7 C)    TempSrc:  Oral    SpO2: 97% 99% 98% 99%    WBC     Component Value Date/Time   WBC 16.8 (H) 06/12/2022 1347   LYMPHSABS 1.2 06/12/2022 1347   LYMPHSABS 2.5 11/25/2016 1430   MONOABS 1.2 (H) 06/12/2022 1347   MONOABS 1.3 (H) 11/25/2016 1430   EOSABS 0.1 06/12/2022 1347   EOSABS 0.3 11/25/2016 1430   BASOSABS 0.0 06/12/2022 1347   BASOSABS 0.0 11/25/2016 1430    Lactic Acid, Venous    Component Value Date/Time   LATICACIDVEN 1.4 06/12/2022 1620     UA   no evidence of UTI    Urine analysis:    Component Value Date/Time   COLORURINE YELLOW 06/12/2022 1642   APPEARANCEUR HAZY (A) 06/12/2022 1642   LABSPEC 1.015 06/12/2022 1642   PHURINE 5.0 06/12/2022 1642   GLUCOSEU NEGATIVE 06/12/2022 1642   HGBUR SMALL (A) 06/12/2022 1642   BILIRUBINUR NEGATIVE 06/12/2022 1642   BILIRUBINUR Negative 10/13/2019 1445   KETONESUR NEGATIVE 06/12/2022 1642   PROTEINUR >=300 (A) 06/12/2022 1642   UROBILINOGEN 1.0 10/13/2019 1445   NITRITE NEGATIVE 06/12/2022 1642   LEUKOCYTESUR NEGATIVE 06/12/2022 1642     __________________________________________________________ Recent Labs  Lab 06/12/22 1347 06/12/22 1955  NA 132* 135  K 3.9 3.4*  CO2 25 25  GLUCOSE 127* 93  BUN 29* 24*  CREATININE 1.56* 1.32*  CALCIUM 8.3* 7.8*  MG  --  1.8  PHOS  --  3.6    Cr  Up from baseline see below Lab Results  Component Value Date   CREATININE 1.56 (H) 06/12/2022   CREATININE 0.89 04/05/2021   CREATININE 0.93 04/03/2021    Recent Labs  Lab  06/12/22 1347  AST 21  ALT 11  ALKPHOS 137*  BILITOT 0.8  PROT 5.8*  ALBUMIN 2.3*   Lab Results  Component Value Date   CALCIUM 8.3 (L) 06/12/2022   PHOS 4.0 11/21/2020        Plt: Lab Results  Component Value Date   PLT 158 06/12/2022       Recent Labs  Lab 06/12/22 1347  WBC 16.8*  NEUTROABS 14.2*  HGB 14.2  HCT 41.8  MCV 89.3  PLT 158    HG/HCT  stable,      Component Value Date/Time   HGB 14.2 06/12/2022 1347   HGB 13.6 12/19/2020 1110   HGB 19.2 (H) 11/25/2016 1430   HCT 41.8 06/12/2022 1347   HCT 38.9 12/19/2020 1110   HCT 54.8 (H) 11/25/2016 1430   MCV 89.3 06/12/2022 1347   MCV 99 (H) 12/19/2020 1110   MCV 92.3 11/25/2016 1430  _______________________________________________ Hospitalist was called for admission for   Cellulitis of right lower extremity    AKI (acute kidney injury) ([place])     The following Work up has been ordered so far:  Orders Placed This Encounter  Procedures   Blood culture (routine x 2)   DG Tibia/Fibula Right   DG Femur Min 2 Views Right   Lactic acid, plasma   Comprehensive metabolic panel   CBC with Differential   Urinalysis, Routine w reflex microscopic -Urine, Clean Catch   Nursing communication   Cardiac Monitoring Continuous x 24 hours Indications for use: Other; Other indications for use: sirs   Consult to vascular surgery  Cellulitis   vancomycin per pharmacy consult   Consult for [place] Admission   Admit to Inpatient (patient's expected length of stay will be greater than 2 midnights or inpatient only procedure)   VAS [place] LOWER EXTREMITY VENOUS (DVT) (ONLY [facility] & [facility])     OTHER Significant initial  Findings:     Radiological Exams on Admission: DG Femur Min 2 Views Right  Result Date: 06/12/2022 CLINICAL DATA:  Right leg pain for several days, no known injury, initial encounter EXAM: RIGHT FEMUR 2 VIEWS COMPARISON:  None Available. FINDINGS: Degenerative changes of the right hip joint are  noted. Diffuse vascular calcifications are seen. No acute fracture or dislocation is noted. No soft tissue changes are seen. IMPRESSION: No acute abnormality noted. Electronically Signed   By: Inez Catalina M.D.   On: 06/12/2022 19:11   DG Tibia/Fibula Right  Result Date: 06/12/2022 CLINICAL DATA:  Right leg pain, swelling, and redness. Chills, weight loss and pain. EXAM: RIGHT TIBIA AND FIBULA - 2 VIEW COMPARISON:  11/15/2020 FINDINGS: Diffuse soft tissue swelling and edema throughout the visualized right lower extremity. No radiopaque soft tissue foreign bodies or soft tissue gas collections. Surgical clips medial to the right knee. Vascular calcifications. Bones appear intact. No evidence of acute fracture or dislocation of the right tibia or fibula. No focal bone lesion or bone destruction. IMPRESSION: Diffuse soft tissue swelling.  No acute bony abnormalities. Electronically Signed   By: Lucienne Capers M.D.   On: 06/12/2022 19:05   VAS [place] LOWER EXTREMITY VENOUS (DVT) (ONLY [facility])  Result Date: 06/12/2022  Lower Venous DVT Study Patient Name:  Joshua [name]  Date of Exam:   06/12/2022 Medical Rec #: [id number]           Accession #:    [id number] Date of Birth: 01-Sep-1955           Patient Gender: M Patient Age:   89 years Exam Location[facility]  [facility] Procedure:      VAS [place] LOWER EXTREMITY VENOUS (DVT) Referring Phys: Herbie [place] PATERSON --------------------------------------------------------------------------------  Indications: Edema, and Pain.  Comparison Study: previous study 12/17/16 negative. Performing Technologist: McKayla Maag RVT, VT  Examination Guidelines: A complete evaluation includes B-mode imaging, spectral Doppler, color Doppler, and power Doppler as needed of all accessible portions of each vessel. Bilateral testing is considered an integral part of a complete examination. Limited examinations for reoccurring indications may be performed as noted. The reflux portion of the exam  is performed with the patient in reverse Trendelenburg.  +--------+---------------+---------+-----------+----------+--------------------+ RIGHT   CompressibilityPhasicitySpontaneityPropertiesThrombus Aging       +--------+---------------+---------+-----------+----------+--------------------+ CFV     Full                                                              +--------+---------------+---------+-----------+----------+--------------------+  SFJ     Full                                                              +--------+---------------+---------+-----------+----------+--------------------+ FV Prox Full                                                              +--------+---------------+---------+-----------+----------+--------------------+ FV Mid  Full                                                              +--------+---------------+---------+-----------+----------+--------------------+ FV                                                   patent by color      Distal                                               doppler              +--------+---------------+---------+-----------+----------+--------------------+ PFV     Full                                                              +--------+---------------+---------+-----------+----------+--------------------+ POP     Full                                                              +--------+---------------+---------+-----------+----------+--------------------+ PTV     Full                                         Not well visualized  +--------+---------------+---------+-----------+----------+--------------------+ PERO    Full           Yes      Yes                  Not well visualized. +--------+---------------+---------+-----------+----------+--------------------+   +----+---------------+---------+-----------+----------+--------------+  LEFTCompressibilityPhasicitySpontaneityPropertiesThrombus Aging +----+---------------+---------+-----------+----------+--------------+ CFV Full           Yes      Yes                                 +----+---------------+---------+-----------+----------+--------------+  SFJ Full                                                        +----+---------------+---------+-----------+----------+--------------+     Summary: RIGHT: - There is no evidence of deep vein thrombosis in the lower extremity. However, portions of this examination were limited- see technologist comments above.  - No cystic structure found in the popliteal fossa. - Ultrasound characteristics of enlarged lymph nodes are noted in the groin.  LEFT: - No evidence of common femoral vein obstruction.  *See table(s) above for measurements and observations. Electronically signed by Deitra Mayo MD on 06/12/2022 at 7:05:14 PM.    Final    VAS [place] LOWER EXTREMITY ARTERIAL DUPLEX  Result Date: 06/12/2022 LOWER EXTREMITY ARTERIAL DUPLEX STUDY Patient Name:  Joshua [name]  Date of Exam:   06/12/2022 Medical Rec #: [id number]           Accession #:    [id number] Date of Birth: [birth year]           Patient Gender: M Patient Age:   62 years Exam Location:  [facility] Procedure:      VAS [place] LOWER EXTREMITY ARTERIAL DUPLEX Referring Phys: Harold Barban --------------------------------------------------------------------------------  Indications: New sudden pain, redness and heat of the right leg.  Vascular Interventions: 04/04/2021                         Pre-operative Diagnosis: Severe right leg claudication                         Post-operative diagnosis: Same                         Surgeon: [name]                         Assistants: Laurence Slate                         Procedure: #1: Right femoral to above-knee popliteal                         bypass graft with 9 reversed ipsilateral saphenous vein                          #2: Redo right femoral artery exposure. Current ABI:            R=0.96, L=0.43 Performing Technologist: Ronal Fear RVS, RCS  Examination Guidelines: A complete evaluation includes B-mode imaging, spectral Doppler, color Doppler, and power Doppler as needed of all accessible portions of each vessel. Bilateral testing is considered an integral part of a complete examination. Limited examinations for reoccurring indications may be performed as noted.   Right Graft #1: femoropopliteal +------------------+--------+--------+----------+--------+                   PSV cm/sStenosisWaveform  Comments +------------------+--------+--------+----------+--------+ Inflow            65              biphasic           +------------------+--------+--------+----------+--------+ Prox Anastomosis  60              monophasic         +------------------+--------+--------+----------+--------+ Proximal Graft    57              monophasic         +------------------+--------+--------+----------+--------+ Mid Graft         124             biphasic           +------------------+--------+--------+----------+--------+ Distal Graft      92              monophasic         +------------------+--------+--------+----------+--------+ Distal Anastomosis101             monophasic         +------------------+--------+--------+----------+--------+ Outflow           126             monophasic         +------------------+--------+--------+----------+--------+   +----------+--------+-----+---------------+----------+--------+ LEFT      PSV cm/sRatioStenosis       Waveform  Comments +----------+--------+-----+---------------+----------+--------+ CFA Distal415          75-99% stenosismonophasic         +----------+--------+-----+---------------+----------+--------+ DFA       127                                             +----------+--------+-----+---------------+----------+--------+ SFA Prox  288          50-74% stenosismonophasic         +----------+--------+-----+---------------+----------+--------+ SFA Mid   26                          monophasic         +----------+--------+-----+---------------+----------+--------+ SFA Distal39                          monophasic         +----------+--------+-----+---------------+----------+--------+ POP Prox  27                          monophasic         +----------+--------+-----+---------------+----------+--------+ POP Distal23                          monophasic         +----------+--------+-----+---------------+----------+--------+ ATA Distal24                          monophasic         +----------+--------+-----+---------------+----------+--------+ PTA Distal33                          monophasic         +----------+--------+-----+---------------+----------+--------+   Summary: Right: Widely patent bypass without stenosis. No evidence of DVT with limited scan. Left: 75-99% stenosis noted in the common femoral artery. 50-74% stenosis noted in the superficial femoral artery.  See table(s) above for measurements and observations.    Preliminary    VAS [place] ABI WITH/WO TBI  Result Date: 06/12/2022  LOWER EXTREMITY DOPPLER STUDY Patient Name:  Joshua [name]  Date of Exam:   06/12/2022 Medical  Rec #: [id number]           Accession #:    [id number] Date of Birth: 04-Feb-1956           Patient Gender: M Patient Age:   39 years Exam Location:  [facility] Procedure:      VAS [place] ABI WITH/WO TBI Referring Phys: --------------------------------------------------------------------------------  Indications: Patient called triage complaining of extreme right lower extremity              pain, redness and swelling 3 days. Patient was scheduled for his 6              month follow up studies.  Vascular Interventions: 04/04/2021                          Pre-operative Diagnosis: Severe right leg claudication                         Post-operative diagnosis: Same                         Surgeon: [name]                         Assistants: Laurence Slate                         Procedure: #1: Right femoral to above-knee popliteal                         bypass graft with 9 reversed ipsilateral saphenous vein                         #2: Redo right femoral artery exposure. Performing Technologist: Ronal Fear RVS, RCS  Examination Guidelines: A complete evaluation includes at minimum, Doppler waveform signals and systolic blood pressure reading at the level of bilateral brachial, anterior tibial, and posterior tibial arteries, when vessel segments are accessible. Bilateral testing is considered an integral part of a complete examination. Photoelectric Plethysmograph (PPG) waveforms and toe systolic pressure readings are included as required and additional duplex testing as needed. Limited examinations for reoccurring indications may be performed as noted.  ABI Findings: +---------+------------------+-----+----------+--------+ Right    Rt Pressure (mmHg)IndexWaveform  Comment  +---------+------------------+-----+----------+--------+ Brachial 163                                       +---------+------------------+-----+----------+--------+ PTA      150               0.91 monophasic         +---------+------------------+-----+----------+--------+ DP       157               0.96 biphasic           +---------+------------------+-----+----------+--------+ Great Toe122               0.74                    +---------+------------------+-----+----------+--------+ +---------+------------------+-----+----------+-------+ Left     Lt Pressure (mmHg)IndexWaveform  Comment +---------+------------------+-----+----------+-------+ Brachial 164                                       +---------+------------------+-----+----------+-------+  PTA      70                0.43 monophasic        +---------+------------------+-----+----------+-------+ DP       71                0.43 monophasic        +---------+------------------+-----+----------+-------+ Great Toe65                0.40                   +---------+------------------+-----+----------+-------+ +-------+-----------+-----------+------------+------------+ ABI/TBIToday's ABIToday's TBIPrevious ABIPrevious TBI +-------+-----------+-----------+------------+------------+ Right  0.96       0.74       1.03        0.63         +-------+-----------+-----------+------------+------------+ Left   0.43       0.40       0.60        0.53         +-------+-----------+-----------+------------+------------+   Right ABIs appear essentially unchanged. Left ABIs appear decreased compared to prior study on 12/13/2021.  Summary: Right: Resting right ankle-brachial index is within normal range. The right toe-brachial index is normal. Left: Resting left ankle-brachial index indicates severe left lower extremity arterial disease. The left toe-brachial index is abnormal. *See table(s) above for measurements and observations.     Preliminary    _______________________________________________________________________________________________________ Latest  Blood pressure (!) 178/55, pulse (!) 56, temperature 98 F (36.7 C), temperature source Oral, resp. rate 16, SpO2 99 %.   Vitals  labs and radiology finding personally reviewed  Review of Systems:    Pertinent positives include:  fatigue, leg pain  Constitutional:  No weight loss, night sweats, Fevers, chills,  weight loss  HEENT:  No headaches, Difficulty swallowing,Tooth/dental problems,Sore throat,  No sneezing, itching, ear ache, nasal congestion, post nasal drip,  Cardio-vascular:  No chest pain, Orthopnea, PND, anasarca, dizziness, palpitations.no Bilateral  lower extremity swelling  GI:  No heartburn, indigestion, abdominal pain, nausea, vomiting, diarrhea, change in bowel habits, loss of appetite, melena, blood in stool, hematemesis Resp:  no shortness of breath at rest. No dyspnea on exertion, No excess mucus, no productive cough, No non-productive cough, No coughing up of blood.No change in color of mucus.No wheezing. Skin:  no rash or lesions. No jaundice GU:  no dysuria, change in color of urine, no urgency or frequency. No straining to urinate.  No flank pain.  Musculoskeletal:  No joint pain or no joint swelling. No decreased range of motion. No back pain.  Psych:  No change in mood or affect. No depression or anxiety. No memory loss.  Neuro: no localizing neurological complaints, no tingling, no weakness, no double vision, no gait abnormality, no slurred speech, no confusion  All systems reviewed and apart from [place] all are negative _______________________________________________________________________________________________ Past Medical History:   Past Medical History:  Diagnosis Date   AC (acromioclavicular) joint bone spurs, unspecified laterality 11/25/2016   Arthritis    "back" (05/16/2015)   CAD in native artery, occluded Lcx and rotational atherectomy to LAD with DES 05/17/15 05/18/2015   Cavitary lesion of lung 02/25/2018   Chronic back pain    "all my back"   Chronic bronchitis (HCC)    COPD (chronic obstructive pulmonary disease) ([place])    Diabetes mellitus without complication (HCC)    GERD (gastroesophageal reflux disease)    H/O blood clots    "had them in my back;  put filter in before one of my neck ORs"   History of gout    Hyperlipidemia    Hypertension    Neuromuscular disorder (HCC)    PAD (peripheral artery disease) (HCC)    Phlebitis    Positive TB test    Seasonal allergies    Shortness of breath    Spinal disease       Past Surgical History:  Procedure Laterality Date   ABDOMINAL AORTOGRAM  W/LOWER EXTREMITY  01/07/2018   ABDOMINAL AORTOGRAM W/LOWER EXTREMITY N/A 01/07/2018   Procedure: ABDOMINAL AORTOGRAM W/LOWER EXTREMITY;  Surgeon: Wellington Hampshire, MD;  Location: [facility];  Service: Cardiovascular;  Laterality: N/A;   ABDOMINAL AORTOGRAM W/LOWER EXTREMITY N/A 12/27/2020   Procedure: ABDOMINAL AORTOGRAM W/LOWER EXTREMITY;  Surgeon: Wellington Hampshire, MD;  Location: [facility];  Service: Cardiovascular;  Laterality: N/A;   ABDOMINAL AORTOGRAM W/LOWER EXTREMITY N/A 04/03/2021   Procedure: ABDOMINAL AORTOGRAM W/LOWER EXTREMITY;  Surgeon: Serafina Mitchell, MD;  Location: [facility];  Service: Cardiovascular;  Laterality: N/A;   ANTERIOR CERVICAL DECOMP/DISCECTOMY FUSION  ~ 2001-2009 X 3   ANTERIOR CERVICAL DECOMP/DISCECTOMY FUSION N/A 10/06/2012   Procedure: ANTERIOR CERVICAL DECOMPRESSION/DISCECTOMY FUSION 1 LEVEL;  Surgeon: [name], MD;  Location: [facility];  Service: Neurosurgery;  Laterality: N/A;  C7T1 anterior cervical decompression with fusion plating and bonegraft    BACK SURGERY     BIOPSY  11/21/2020   Procedure: BIOPSY;  Surgeon: Mauri Pole, MD;  Location: [facility];  Service: Endoscopy;;   CARDIAC CATHETERIZATION  05/16/2015   CARDIAC CATHETERIZATION N/A 05/16/2015   Procedure: Left Heart Cath and Coronary Angiography;  Surgeon: [name], MD;  Location: [facility];  Service: Cardiovascular;  Laterality: N/A;   CARDIAC CATHETERIZATION N/A 05/16/2015   Procedure: Coronary Balloon Angioplasty;  Surgeon: [name], MD;  Location: [facility];  Service: Cardiovascular;  Laterality: N/A;   CARDIAC CATHETERIZATION N/A 05/17/2015   Procedure: Coronary Stent Intervention Rotoblater;  Surgeon: [name], MD;  Location: [facility];  Service: Cardiovascular;  Laterality: N/A;   COLONOSCOPY WITH PROPOFOL N/A 11/21/2020   Procedure: COLONOSCOPY WITH PROPOFOL;  Surgeon: Mauri Pole, MD;  Location:  [facility];  Service: Endoscopy;  Laterality: N/A;   ELBOW SURGERY Left 2016   "had bone pieces removed"   ENDARTERECTOMY FEMORAL Right 12/28/2020   Procedure: RIGHT FEMORAL ARTERY ENDARTERECTOMY;  Surgeon: Serafina Mitchell, MD;  Location: [facility];  Service: Vascular;  Laterality: Right;   ESOPHAGEAL MANOMETRY N/A 12/30/2016   Procedure: ESOPHAGEAL MANOMETRY (EM);  Surgeon: Irene Shipper, MD;  [facility];  Service: Endoscopy;  Laterality: N/A;   ESOPHAGOGASTRODUODENOSCOPY (EGD) WITH PROPOFOL N/A 11/21/2020   Procedure: ESOPHAGOGASTRODUODENOSCOPY (EGD) WITH PROPOFOL;  Surgeon: Mauri Pole, MD;  [facility];  Service: Endoscopy;  Laterality: N/A;   EXCISIONAL HEMORRHOIDECTOMY     FEMORAL BYPASS  ~ 2010   FEMORAL-POPLITEAL BYPASS GRAFT Right 04/04/2021   Procedure: RIGHT FEMORAL-POPLITEAL ARTERY BYPASS GRAFT;  Surgeon: Serafina Mitchell, MD;  Location: [facility];  Service: Vascular;  Laterality: Right;   IR RADIOLOGIST EVAL & MGMT  12/17/2016   IVC FILTER PLACEMENT ([place] HX)  2010   "had blood clots in my back"   NECK SURGERY  2014   2008-2014   [facility] ANGIOPLASTY Right 12/28/2020   Procedure: PATCH ANGIOPLASTY USING;  Surgeon: Serafina Mitchell, MD;  Location: [facility];  Service:  Vascular;  Laterality: Right;   PERIPHERAL VASCULAR ATHERECTOMY  01/07/2018   Procedure: PERIPHERAL VASCULAR ATHERECTOMY;  Surgeon: Wellington Hampshire, MD;  Location: [facility];  Service: Cardiovascular;;   PERIPHERAL VASCULAR BALLOON ANGIOPLASTY  01/07/2018   Procedure: PERIPHERAL VASCULAR BALLOON ANGIOPLASTY;  Surgeon: Wellington Hampshire, MD;  Location: [facility];  Service: Cardiovascular;;   PERIPHERAL VASCULAR CATHETERIZATION N/A 02/14/2016   Procedure: Abdominal Aortogram w/Lower Extremity;  Surgeon: Wellington Hampshire, MD;  Location: [facility];  Service: Cardiovascular;  Laterality: N/A;   PERIPHERAL VASCULAR INTERVENTION Right 04/03/2021   Procedure: PERIPHERAL VASCULAR  INTERVENTION;  Surgeon: Serafina Mitchell, MD;  Location: [facility];  Service: Cardiovascular;  Laterality: Right;   POLYPECTOMY  11/21/2020   Procedure: POLYPECTOMY;  Surgeon: Mauri Pole, MD;  [facility];  Service: Endoscopy;;   POSTERIOR LAMINECTOMY / DECOMPRESSION LUMBAR SPINE  2006   "bone spurs"   VIDEO BRONCHOSCOPY WITH ENDOBRONCHIAL NAVIGATION Right 02/25/2018   Procedure: VIDEO BRONCHOSCOPY WITH ENDOBRONCHIAL NAVIGATION;  Surgeon: Garner Nash, DO;  Location: [facility];  Service: Thoracic;  Laterality: Right;   VIDEO BRONCHOSCOPY WITH ENDOBRONCHIAL ULTRASOUND Right 02/25/2018   Procedure: VIDEO BRONCHOSCOPY WITH ENDOBRONCHIAL ULTRASOUND;  Surgeon: Garner Nash, DO;  Location: [facility];  Service: Thoracic;  Laterality: Right;    Social History:  Ambulatory   independently     reports that he quit smoking about 16 months ago. His smoking use included cigarettes. He has a 24.50 pack-year smoking history. He has been exposed to tobacco smoke. He has never used smokeless tobacco. He reports current alcohol use. He reports that he does not use drugs.   Family History:   Family History  Problem Relation Age of Onset   Stomach cancer Mother        Deceased, 43s   Hypertension Mother    Hypertension Father    Stroke Father    Heart attack Father        Deceased, 92   Aneurysm Brother    Healthy Daughter    Healthy Maternal Grandmother    Leukemia Grandchild    Diabetes Neg Hx    Early death Neg Hx    Colon cancer Neg Hx    Pancreatic cancer Neg Hx    ______________________________________________________________________________________________ Allergies: Allergies  Allergen Reactions   Zestril [Lisinopril] Hives and Itching   Contrast Media [Iodinated Contrast Media] Hives and Itching    OK if pt has Benadryl pre-med.   Elavil [Amitriptyline] Nausea Only and Other (See Comments)    Makes pt "feel weird"   Gadolinium Derivatives Other (See  Comments)    Unknown reaction   Motrin [Ibuprofen] Other (See Comments)    Internal bleeding     Prior to Admission medications   Medication Sig Start Date End Date Taking? Authorizing Provider  albuterol (PROAIR HFA) 108 (90 Base) MCG/ACT inhaler USE 2 PUFFS EVERY 6 HOURS  AS NEEDED FOR WHEEZING OR  SHORTNESS OF BREATH Patient taking differently: Inhale 2 puffs into the lungs every 6 (six) hours as needed for wheezing or shortness of breath. 01/19/20  Yes Icard, Bradley L, DO  albuterol (PROVENTIL) (2.5 MG/3ML) 0.083% nebulizer solution Take 3 mLs (2.5 mg total) by nebulization every 6 (six) hours as needed for wheezing or shortness of breath. 09/16/18  Yes Icard, Octavio Graves, DO  aspirin EC 81 MG tablet Take 81 mg by mouth daily.   Yes [provider]  carvedilol (COREG) 12.5 MG  tablet TAKE 1 TABLET BY MOUTH  TWICE DAILY WITH MEALS Patient taking differently: Take 12.5 mg by mouth 2 (two) times daily. 02/23/21  Yes Charlott Rakes, MD  Cholecalciferol (VITAMIN D-3 PO) Take 1 capsule by mouth daily.   Yes [provider]  ezetimibe (ZETIA) 10 MG tablet TAKE 1 TABLET BY MOUTH DAILY Patient taking differently: Take 10 mg by mouth every evening. 02/18/22  Yes Wellington Hampshire, MD  Fluticasone-Umeclidin-Vilant (TRELEGY ELLIPTA) 200-62.5-25 MCG/ACT AEPB Inhale 1 puff into the lungs every evening.   Yes [provider]  HYDROcodone-acetaminophen (NORCO/VICODIN) 5-325 MG tablet Take 1 tablet by mouth every 4 (four) hours as needed for severe pain.   Yes [provider]  omeprazole (PRILOSEC) 40 MG capsule TAKE 1 CAPSULE BY MOUTH DAILY Patient taking differently: Take 40 mg by mouth daily. 03/08/21  Yes Charlott Rakes, MD  pregabalin (LYRICA) 100 MG capsule Take 100 mg by mouth 3 (three) times daily.   Yes [provider]  rosuvastatin (CRESTOR) 40 MG tablet Take 1 tablet (40 mg total) by mouth daily. Pt. Will need to make an appointment in order to receive  refills. Patient taking differently: Take 40 mg by mouth every evening. 06/07/22  Yes Wellington Hampshire, MD  traZODone (DESYREL) 100 MG tablet TAKE 1 TABLET BY MOUTH AT  BEDTIME Patient taking differently: Take 100 mg by mouth at bedtime. 09/24/19  Yes Baity, Coralie Keens, NP  colchicine 0.6 MG tablet TAKE 2 TABLETS BY MOUTH AS  DIRECTED THEN 1 TABLET  AFTER 1 HOUR. CAN TAKE 1  TABLET TWICE DAILY  THEREAFTER. Patient not taking: Reported on 06/12/2022 11/28/19   Jearld Fenton, NP  DULoxetine (CYMBALTA) 60 MG capsule Take 1 capsule (60 mg total) by mouth daily. For Neuropathy Patient not taking: Reported on 06/12/2022 12/07/20   Charlott Rakes, MD  Fluticasone-Umeclidin-Vilant (TRELEGY ELLIPTA) 100-62.5-25 MCG/INH AEPB Inhale 1 puff into the lungs daily. Patient not taking: Reported on 06/12/2022 01/19/20   Garner Nash, DO  oxyCODONE-acetaminophen (PERCOCET/ROXICET) 5-325 MG tablet Take 1 tablet by mouth every 6 (six) hours as needed for severe pain. Patient not taking: Reported on 06/12/2022 05/22/21   Dagoberto Ligas, PA-C    ___________________________________________________________________________________________________ Physical Exam:    06/12/2022    7:10 PM 06/12/2022    4:30 PM 06/12/2022    4:26 PM  Vitals with BMI  Systolic [id number] [phone]  Diastolic 55 76 73  Pulse 56 56 56     1. General:  in No  Acute distress   Chronically ill   2. Psychological: Alert and   Oriented 3. Head/ENT:   Dry Mucous Membranes                          Head Non traumatic, neck supple                         Poor Dentition 4. SKIN: decreased Skin turgor,  Skin clean Dry red and swollen right leg noted  On arrival to ER   At 2100/ 06/12/2022     5. Heart: Regular rate and rhythm no  Murmur, no Rub or gallop 6. Lungs:occasional wheezes or crackles   7. Abdomen: Soft,  non-tender,  distended   obese  bowel sounds present 8. Lower extremities: no clubbing, cyanosis, no  edema Right leg 9. Neurologically  Grossly intact, moving all 4 extremities equally   10. MSK: Normal range  of motion    Chart has been reviewed  ______________________________________________________________________________________________  Assessment/Plan  67 y.o. male with medical history significant of PAD, CAd, COPD, DM2, GERD, gout, HLD, HTN. Afib not on anticoagulation due to hx of GI bleed   Admitted for   Cellulitis of right lower extremity  AKI (acute kidney injury) ([place])    Present on Admission:  Cellulitis  Tobacco abuse  Essential hypertension  Moderate COPD (chronic obstructive pulmonary disease) (HCC)  Peripheral vascular disease (HCC)  GERD (gastroesophageal reflux disease)  Type 2 diabetes mellitus with peripheral neuropathy (HCC)  CAD in native artery, occluded Lcx and rotational atherectomy to LAD with DES 05/17/15  HLD (hyperlipidemia)  Paroxysmal atrial fibrillation (HCC)  AKI (acute kidney injury) ([place])  Hypokalemia  Hyponatremia     Tobacco abuse  - Spoke about importance of quitting spent 5 minutes discussing options for treatment, prior attempts at quitting, and dangers of smoking  -At this point patient is   NOT  interested in quitting  - refused nicotine patch   - nursing tobacco cessation protocol   Cellulitis -admit per  cellulitis protocol will       continue current antibiotic choice vancomycin and cefepime      plain films showed:   no evidence of air  no evidence of osteomyelitis   no               foreign   objects     Will obtain MRSA screening,       obtain blood cultures  if febrile or septic     further antibiotic adjustment pending above results   Essential hypertension   restart Coreg 12.5 mgTwice daily t   Moderate COPD (chronic obstructive pulmonary disease) ([place]) Continue albuterol as needed and home inhalers currently noncontributory  Peripheral vascular disease ([place]) Vascular surgery aware.  Patient continues to smoke.  In case patient may need father  studies will hold off on aspirin for the day  GERD (gastroesophageal reflux disease) Continue Protonix  Type 2 diabetes mellitus with peripheral neuropathy ([place]) Order sliding scale patient is not taking p.o. medications  CAD in native artery, occluded Lcx and rotational atherectomy to LAD with DES 05/17/15 Chronic stable hold aspirin continue statin Crestor 40 mg daily and Zetia 10 mg a day continue Coreg 12.5 mg twice daily  HLD (hyperlipidemia) Continue Crestor 40 mg a day  Paroxysmal atrial fibrillation ([place]) Was isolated one-time event.  Currently not on anticoagulation secondary risks of fall and bleeding continue Coreg currently appears to be in sinus rhythm but will obtain EKG and monitor  AKI (acute kidney injury) ([place]) Will rehydrate obtain your electrolytes  Hypokalemia Will replace and follow electrolytes  Hyponatremia Improving with IV fluid rehydration obtain electrolytes chest x-ray unremarkable will check TSH   Other plan as per orders.  DVT prophylaxis:  SCD     Code Status:    Code Status: Prior FULL CODE as per patient   I had personally discussed CODE STATUS with patient     Family Communication:   Family not at  Bedside    Disposition Plan:     To home once workup is complete and patient is stable   Following barriers for discharge:                            Electrolytes corrected  Pain controlled with PO medications                               Afebrile, white count improving able to transition to PO antibiotics                             Will need to be able to tolerate PO                                                       Will need consultants to evaluate patient prior to discharge     Consults called: vascular surgery aware  Admission status:  ED Disposition     ED Disposition  Admit   Condition  --   [place]: [facility] [[id number]]  Level of  Care: Telemetry Medical [[id number]]  May admit patient to [facility] or [facility] if equivalent level of care is available:: No  Covid Evaluation: Asymptomatic - no recent exposure (last 10 days) testing not required  Diagnosis: Cellulitis [id number]  Admitting Physician: Toy Baker [[id number]  Attending Physician: Toy Baker [id number]  Certification:: I certify this patient will need inpatient services for at least 2 midnights  Estimated Length of Stay: 2           inpatient     I Expect 2 midnight stay secondary to severity of patient's current illness need for inpatient interventions justified by the following:   Severe lab/radiological/exam abnormalities including:   Right lower extremity cellulitis and extensive comorbidities including:  substance abuse   DM2    CAD  COPD/asthma    That are currently affecting medical management.   I expect  patient to be hospitalized for 2 midnights requiring inpatient medical care.  Patient is at high risk for adverse outcome (such as loss of life or disability) if not treated.  Indication for inpatient stay as follows:   severe pain requiring acute inpatient management,  inability to maintain oral hydration     Need for operative/procedural  intervention     Need for IV antibiotics, IV fluids,  IV pain medications    Level of care     tele  For 12H      Theodoros Stjames 06/12/2022, 9:39 PM    [facility]     after 2 AM please page floor coverage PA If 7AM-7PM, please contact the day team taking care of the patient using [email or web address]

## 2022-06-12 NOTE — Assessment & Plan Note (Signed)
Order sliding scale patient is not taking p.o. medications

## 2022-06-12 NOTE — Assessment & Plan Note (Signed)
-   Spoke about importance of quitting spent 5 minutes discussing options for treatment, prior attempts at quitting, and dangers of smoking  -At this point patient is NOT  interested in quitting  - refused nicotine patch   - nursing tobacco cessation protocol  

## 2022-06-12 NOTE — Progress Notes (Signed)
Office Note   History of Present Illness   Joshua [name] is a 67 y.o. ([birth year]) male who presents as a triage visit.  He has a history of right femoral to above the knee popliteal bypass with vein by Dr. Trula [name] on 04/04/2021.  He previously underwent right common femoral endarterectomy on 12/28/2020 also by Dr. Trula [name].  He has a history of aortobifemoral bypass at [place].  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  He has dealt with chronic right lower extremity edema after his femoropopliteal bypass in 2023.  He elevates his leg and wears compression stockings to try to help with the edema and discomfort.  He returns to the clinic today as a triage visit.  He states about 3 days ago he woke up in the morning with extreme redness, swelling, and pain in his right leg.  The swelling was present in his upper right thigh and extended all the way to his right foot.  His right thigh also felt very hot to the touch and he felt feverish.  He states that his redness, swelling, and pain in the right leg has gotten worse over the last few days.  He only gets some relief from his pain with his pain pill.  He is unable to walk properly or put full pressure on his right leg due to the pain and swelling.  He denies any wounds on his legs and is unsure of the source of infection.  He denies any nausea or vomiting.  He also feels like he has developed some "fever blisters"on his lips.  He has not been able to eat well over the past few days.  Current Outpatient Medications  Medication Sig Dispense Refill   acetaminophen (TYLENOL) 500 MG tablet Take 1,000 mg by mouth every 6 (six) hours as needed for mild pain.     albuterol (PROAIR HFA) 108 (90 Base) MCG/ACT inhaler USE 2 PUFFS EVERY 6 HOURS  AS NEEDED FOR WHEEZING OR  SHORTNESS OF BREATH (Patient taking differently: Inhale 2 puffs into the lungs every 6 (six) hours as needed for wheezing or  shortness of breath.) 34 g 3   albuterol (PROVENTIL) (2.5 MG/3ML) 0.083% nebulizer solution Take 3 mLs (2.5 mg total) by nebulization every 6 (six) hours as needed for wheezing or shortness of breath. 75 mL 5   aspirin EC 81 MG tablet Take 81 mg by mouth daily.     Blood Glucose Monitoring Suppl (ONETOUCH VERIO) w/Device KIT 1 Device by Does not apply route once. 1 kit 0   Camphor (JOINTFLEX EX) Apply 1 application topically daily as needed (pain).     carvedilol (COREG) 12.5 MG tablet TAKE 1 TABLET BY MOUTH  TWICE DAILY WITH MEALS 60 tablet 0   colchicine 0.6 MG tablet TAKE 2 TABLETS BY MOUTH AS  DIRECTED THEN 1 TABLET  AFTER 1 HOUR. CAN TAKE 1  TABLET TWICE DAILY  THEREAFTER. (Patient taking differently: Take 0.6 mg by mouth 2 (two) times daily as needed (gout flare).) 60 tablet 11   DULoxetine (CYMBALTA) 60 MG capsule Take 1 capsule (60 mg total) by mouth daily. For Neuropathy 30 capsule 6   ezetimibe (ZETIA) 10 MG tablet TAKE 1 TABLET BY MOUTH DAILY 100 tablet 0   Fluticasone-Umeclidin-Vilant (TRELEGY ELLIPTA) 100-62.5-25 MCG/INH AEPB Inhale 1 puff into the lungs daily. 2 each 5   methocarbamol (ROBAXIN) 500 MG tablet Take 500 mg by mouth every 6 (  six) hours as needed for muscle spasms.     omeprazole (PRILOSEC) 40 MG capsule TAKE 1 CAPSULE BY MOUTH DAILY 90 capsule 0   ONE TOUCH LANCETS MISC 1 each by Does not apply route 3 (three) times daily. 200 each 5   ONETOUCH ULTRA test strip USE AS DIRECTED 300 strip 1   oxyCODONE-acetaminophen (PERCOCET/ROXICET) 5-325 MG tablet Take 1 tablet by mouth every 6 (six) hours as needed for severe pain. 10 tablet 0   pregabalin (LYRICA) 100 MG capsule Take 100 mg by mouth 3 (three) times daily.     rosuvastatin (CRESTOR) 40 MG tablet Take 1 tablet (40 mg total) by mouth daily. Pt. Will need to make an appointment in order to receive refills. 30 tablet 0   traZODone (DESYREL) 100 MG tablet TAKE 1 TABLET BY MOUTH AT  BEDTIME (Patient taking differently: Take  100 mg by mouth at bedtime as needed for sleep.) 90 tablet 2   No current facility-administered medications for this visit.    REVIEW OF SYSTEMS (negative unless checked):   Cardiac:  []  Chest pain or chest pressure? []  Shortness of breath upon activity? []  Shortness of breath when lying flat? []  Irregular heart rhythm?  Vascular:  [x]  Pain in calf, thigh, or hip brought on by walking? []  Pain in feet at night that wakes you up from your sleep? []  Blood clot in your veins? [x]  Leg swelling?  Pulmonary:  []  Oxygen at home? []  Productive cough? []  Wheezing?  Neurologic:  []  Sudden weakness in arms or legs? []  Sudden numbness in arms or legs? []  Sudden onset of difficult speaking or slurred speech? []  Temporary loss of vision in one eye? []  Problems with dizziness?  Gastrointestinal:  []  Blood in stool? []  Vomited blood?  Genitourinary:  []  Burning when urinating? []  Blood in urine?  Psychiatric:  []  Major depression  Hematologic:  []  Bleeding problems? []  Problems with blood clotting?  Dermatologic:  [x]  Rashes or ulcers?  Constitutional:  [x]  Fever or chills?  Ear/Nose/Throat:  []  Change in hearing? []  Nose bleeds? []  Sore throat?  Musculoskeletal:  []  Back pain? []  Joint pain? []  Muscle pain?   Physical Examination   Vitals:   06/12/22 1116  BP: (!) 140/76  Pulse: (!) 58  Resp: 18  Temp: 97.9 F (36.6 C)  TempSrc: Temporal  SpO2: 96%  Weight: 236 lb (107 kg)  Height: 5\' 9"  (1.753 m)   Body mass index is 34.85 kg/m.  General:  WDWN in NAD; vital signs documented above Gait: Not observed HENT: WNL, normocephalic Pulmonary: normal non-labored breathing  Cardiac: regular Abdomen: soft, NT, no masses Skin: without rashes Vascular Exam/Pulses: Nonpalpable pedal pulses.  Biphasic right DP and monophasic right PT.  Monophasic signals in the left foot. Extremities: Extensive cellulitis in the right lower extremity extending from the foot  to the proximal inner thigh.  Currently no erythema in the right groin.  Extensive edema in the right lower extremity.  Right lower extremity is very warm and sensitive to touch Musculoskeletal: no muscle wasting or atrophy  Neurologic: A&O X 3;  No focal weakness or paresthesias are detected Psychiatric:  The pt has Normal affect.       Non-Invasive Vascular Imaging ABI (06/12/2022) +---------+------------------+-----+----------+--------+  Right   Rt Pressure (mmHg)IndexWaveform  Comment   +---------+------------------+-----+----------+--------+  Brachial 163                                        +---------+------------------+-----+----------+--------+  PTA     150               0.91 monophasic          +---------+------------------+-----+----------+--------+  DP      157               0.96 biphasic            +---------+------------------+-----+----------+--------+  Great Toe122               0.74                     +---------+------------------+-----+----------+--------+   +---------+------------------+-----+----------+-------+  Left    Lt Pressure (mmHg)IndexWaveform  Comment  +---------+------------------+-----+----------+-------+  Brachial 164                                       +---------+------------------+-----+----------+-------+  PTA     70                0.43 monophasic         +---------+------------------+-----+----------+-------+  DP      71                0.43 monophasic         +---------+------------------+-----+----------+-------+  Great Toe65                0.40                    +---------+------------------+-----+----------+-------+   +-------+-----------+-----------+------------+------------+  ABI/TBIToday's ABIToday's TBIPrevious ABIPrevious TBI  +-------+-----------+-----------+------------+------------+  Right 0.96       0.74       1.03        0.63           +-------+-----------+-----------+------------+------------+  Left  0.43       0.40       0.60        0.53          +-------+-----------+-----------+------------+------------+   RLE Bypass Duplex (06/12/2022) Patent right femoropopliteal bypass graft without stenosis.  No evidence of DVT in the right lower extremity.   Medical Decision Making   Joshua [name] is a 67 y.o. male who presents as a triage visit for extensive cellulitis in the right lower extremity  Based on the patient's vascular studies, he has patent arterial flow in the right lower extremity.  There is no evidence of DVT in the right leg.  His right femoropopliteal bypass is patent without stenosis On exam he has extensive cellulitis in the right lower extremity that extends from his foot to his proximal medial thigh.  This is accompanied by extensive edema in the right leg.  His right leg is painful to touch.  He also has had a difficult time walking over the past 3 days.  He states that his redness and swelling has gotten worse since it presented 3 days ago.  He has felt "feverish" but has no documented fevers Given that the patient's right lower extremity cellulitis extends to his proximal thigh and is causing significant pain, I have recommended that the patient go to the emergency room for IV antibiotics.  He has a history of aortobifemoral bypass and there is concern if his infection progresses, his bypass could get infected.  This would be a life-threatening situation The patient understands the urgency of his condition.  He is agreeable to go to the hospital  for IV antibiotics.  I have made our call MD aware of the situation.  Vicente Serene PA-C [facility] Office: [phone]  Call MD: Scot Dock

## 2022-06-12 NOTE — ED Notes (Signed)
Pt transported to [facility] US 

## 2022-06-12 NOTE — Assessment & Plan Note (Signed)
Will replace and follow electrolytes

## 2022-06-12 NOTE — Assessment & Plan Note (Signed)
Continue Crestor 40 mg a day

## 2022-06-12 NOTE — ED Notes (Signed)
Pt transported to XR.  

## 2022-06-12 NOTE — Progress Notes (Signed)
VASCULAR SURGERY:  This patient was seen in our office today by Luisa Dago, PA with swelling and cellulitis in the right leg.  This began several days ago.  The patient has a remote history of an aortofemoral bypass graft and ECU.  On 12/28/2020 the patient had a right femoral endarterectomy and Dacron patch angioplasty with iliofemoral thrombectomy.  Most recently Dr. Trula Slade performed a right femoral to above-knee popliteal artery bypass with vein on 04/04/2021.  Noninvasive studies today show that his bypass graft is patent.  He has not had a formal venous test and this is being ordered by Dr. Sharlett Iles.  Groin incision is healed nicely.  I will hold off on a CT of his abdomen and pelvis for now.  He has mild chronic kidney disease.  I have written specific instructions on how to elevate the leg.  He is being admitted for intravenous antibiotics by the medical service.  Vascular surgery will follow.  Gae Gallop, MD 4:20 PM

## 2022-06-12 NOTE — Assessment & Plan Note (Signed)
restart Coreg 12.5 mgTwice daily t

## 2022-06-12 NOTE — Assessment & Plan Note (Signed)
-  admit per  cellulitis protocol will       continue current antibiotic choice vancomycin and cefepime      plain films showed:   no evidence of air  no evidence of osteomyelitis   no               foreign   objects     Will obtain MRSA screening,       obtain blood cultures  if febrile or septic     further antibiotic adjustment pending above results

## 2022-06-12 NOTE — Assessment & Plan Note (Signed)
Will rehydrate obtain your electrolytes

## 2022-06-12 NOTE — Telephone Encounter (Signed)
Patient scheduled today for [place]. Patient has scheduled appt in office tomorrow at 12:45. Patient is requesting to be seen today due to redness and swelling to his leg. We have no available openings today spoke with PA unable to work into schedule today . Advised patient to keep appt for tomorrow if symptoms get worse go to an urgent care. Patient verbalized understanding.

## 2022-06-12 NOTE — Assessment & Plan Note (Signed)
Improving with IV fluid rehydration obtain electrolytes chest x-ray unremarkable will check TSH

## 2022-06-12 NOTE — Assessment & Plan Note (Signed)
Vascular surgery aware.  Patient continues to smoke.  In case patient may need father studies will hold off on aspirin for the day

## 2022-06-12 NOTE — Progress Notes (Addendum)
Pharmacy Antibiotic Note  Joshua [name] is a 67 y.o. male admitted on 06/12/2022 with cellulitis.  Pharmacy has been consulted for vancomycin dosing. Patient received one dose ceftriaxone in ED.   Plan: Vancomycin 2500 mg IV once, followed by vancomycin 1250 mg IV Q24H (eAUC 540, Scr 1.56, Vd 0.5) Obtain vancomycin levels as indicated Monitor renal function and signs of infection  Temp (24hrs), Avg:98.4 F (36.9 C), Min:97.9 F (36.6 C), Max:98.8 F (37.1 C)  Recent Labs  Lab 06/12/22 1345 06/12/22 1347  WBC  --  16.8*  CREATININE  --  1.56*  LATICACIDVEN 1.1  --     Estimated Creatinine Clearance: 56.1 mL/min (A) (by C-G formula based on SCr of 1.56 mg/dL (H)).    Allergies  Allergen Reactions   Lisinopril Hives and Itching   Amitriptyline Other (See Comments)    Makes pt feel "weird" and nausea.   Contrast Media [Iodinated Contrast Media] Hives and Itching    Pt can use if taking benadryl    Gadolinium Derivatives     Other reaction(s): Not available   Ibuprofen Other (See Comments)    Internal bleeding    Antimicrobials this admission: Ceftriaxone 3/27 Vancomycin 3/27 >>   Dose adjustments this admission:  Microbiology results: 3/27 blood culture:   Thank you for allowing pharmacy to be a part of this patient's care.  Jeneen Rinks [id number] PM  Addendum: Ceftriaxone changed to cefepime consulted per pharmacy. Will dose cefepime at 2g Q12H.   Esmeralda Arthur, PharmD, BCCCP

## 2022-06-12 NOTE — Progress Notes (Signed)
Right lower extremity venous study completed.   Preliminary results relayed to MD and RN.  Please see CV Procedures for preliminary results.  Dinorah Masullo, RVT  5:03 PM 06/12/22

## 2022-06-12 NOTE — ED Provider Notes (Signed)
[facility] Provider Note   CSN: [id number] Arrival date & time: 06/12/22  1254     History  Chief Complaint  Patient presents with  . Sent by PCP  . Leg Pain    TORREZ WHACK is a 67 y.o. male.  67 year old male with a history of CAD status post PCI, COPD, diabetes, peripheral arterial disease status post right lower extremity bypass who presents emergency department with right lower extremity pain, swelling, redness, and fevers.  Reports that he has had longstanding swelling of his right lower extremity.  Over the past week has developed redness that has started after his lower extremity and started streaking up his groin.  Has had fevers and chills.  No recent antibiotics.  Went to vascular clinic today where they performed an ABI and referred him to the emergency department for antibiotics.       Home Medications Prior to Admission medications   Medication Sig Start Date End Date Taking? Authorizing Provider  albuterol (PROAIR HFA) 108 (90 Base) MCG/ACT inhaler USE 2 PUFFS EVERY 6 HOURS  AS NEEDED FOR WHEEZING OR  SHORTNESS OF BREATH Patient taking differently: Inhale 2 puffs into the lungs every 6 (six) hours as needed for wheezing or shortness of breath. 01/19/20  Yes Icard, Bradley L, DO  albuterol (PROVENTIL) (2.5 MG/3ML) 0.083% nebulizer solution Take 3 mLs (2.5 mg total) by nebulization every 6 (six) hours as needed for wheezing or shortness of breath. 09/16/18  Yes Icard, Octavio Graves, DO  aspirin EC 81 MG tablet Take 81 mg by mouth daily.   Yes [provider]  carvedilol (COREG) 12.5 MG tablet TAKE 1 TABLET BY MOUTH  TWICE DAILY WITH MEALS Patient taking differently: Take 12.5 mg by mouth 2 (two) times daily. 02/23/21  Yes Charlott Rakes, MD  Cholecalciferol (VITAMIN D-3 PO) Take 1 capsule by mouth daily.   Yes [provider]  ezetimibe (ZETIA) 10 MG tablet TAKE 1 TABLET BY MOUTH DAILY Patient taking  differently: Take 10 mg by mouth every evening. 02/18/22  Yes Wellington Hampshire, MD  Fluticasone-Umeclidin-Vilant (TRELEGY ELLIPTA) 200-62.5-25 MCG/ACT AEPB Inhale 1 puff into the lungs every evening.   Yes [provider]  HYDROcodone-acetaminophen (NORCO/VICODIN) 5-325 MG tablet Take 1 tablet by mouth every 4 (four) hours as needed for severe pain.   Yes [provider]  omeprazole (PRILOSEC) 40 MG capsule TAKE 1 CAPSULE BY MOUTH DAILY Patient taking differently: Take 40 mg by mouth daily. 03/08/21  Yes Charlott Rakes, MD  pregabalin (LYRICA) 100 MG capsule Take 100 mg by mouth 3 (three) times daily.   Yes [provider]  rosuvastatin (CRESTOR) 40 MG tablet Take 1 tablet (40 mg total) by mouth daily. Pt. Will need to make an appointment in order to receive refills. Patient taking differently: Take 40 mg by mouth every evening. 06/07/22  Yes Wellington Hampshire, MD  traZODone (DESYREL) 100 MG tablet TAKE 1 TABLET BY MOUTH AT  BEDTIME Patient taking differently: Take 100 mg by mouth at bedtime. 09/24/19  Yes Baity, Coralie Keens, NP  colchicine 0.6 MG tablet TAKE 2 TABLETS BY MOUTH AS  DIRECTED THEN 1 TABLET  AFTER 1 HOUR. CAN TAKE 1  TABLET TWICE DAILY  THEREAFTER. Patient not taking: Reported on 06/12/2022 11/28/19   Jearld Fenton, NP  DULoxetine (CYMBALTA) 60 MG capsule Take 1 capsule (60 mg total) by mouth daily. For Neuropathy Patient not taking: Reported on 06/12/2022 12/07/20  Charlott Rakes, MD  Fluticasone-Umeclidin-Vilant (TRELEGY ELLIPTA) 100-62.5-25 MCG/INH AEPB Inhale 1 puff into the lungs daily. Patient not taking: Reported on 06/12/2022 01/19/20   Garner Nash, DO  oxyCODONE-acetaminophen (PERCOCET/ROXICET) 5-325 MG tablet Take 1 tablet by mouth every 6 (six) hours as needed for severe pain. Patient not taking: Reported on 06/12/2022 05/22/21   Dagoberto Ligas, PA-C      Allergies    Zestril [lisinopril], Contrast media [iodinated contrast media], Elavil  [amitriptyline], Gadolinium derivatives, and Motrin [ibuprofen]    Review of Systems   Review of Systems  Physical Exam Updated Vital Signs BP (!) 153/73 (BP Location: Left Arm)   Pulse (!) 56   Temp 98 F (36.7 C) (Oral)   Resp 19   SpO2 99%  Physical Exam Vitals and nursing note reviewed.  Constitutional:      General: He is not in acute distress.    Appearance: He is well-developed.  HENT:     Head: Normocephalic and atraumatic.     Right Ear: External ear normal.     Left Ear: External ear normal.     Nose: Nose normal.  Eyes:     Extraocular Movements: Extraocular movements intact.     Conjunctiva/sclera: Conjunctivae normal.     Pupils: Pupils are equal, round, and reactive to light.  Cardiovascular:     Rate and Rhythm: Normal rate and regular rhythm.  Pulmonary:     Effort: Pulmonary effort is normal. No respiratory distress.  Abdominal:     General: There is no distension.  Musculoskeletal:     Cervical back: Normal range of motion and neck supple.     Right lower leg: Edema present.     Left lower leg: No edema.     Comments: See below for erythema.  No crepitance noted.  Induration of the entire leg.  No areas of fluctuance palpated.  Skin:    General: Skin is warm and dry.  Neurological:     Mental Status: He is alert. Mental status is at baseline.  Psychiatric:        Mood and Affect: Mood normal.        Behavior: Behavior normal.    Right lower extremity:  ED Results / Procedures / Treatments   Labs (all labs ordered are listed, but only abnormal results are displayed) Labs Reviewed  COMPREHENSIVE METABOLIC PANEL - Abnormal; Notable for the following components:      Result Value   Sodium 132 (*)    Chloride 96 (*)    Glucose, Bld 127 (*)    BUN 29 (*)    Creatinine, Ser 1.56 (*)    Calcium 8.3 (*)    Total Protein 5.8 (*)    Albumin 2.3 (*)    Alkaline Phosphatase 137 (*)    GFR, Estimated 49 (*)    All other components within normal  limits  CBC WITH DIFFERENTIAL/PLATELET - Abnormal; Notable for the following components:   WBC 16.8 (*)    RDW 15.8 (*)    Neutro Abs 14.2 (*)    Monocytes Absolute 1.2 (*)    All other components within normal limits  URINALYSIS, ROUTINE W REFLEX MICROSCOPIC - Abnormal; Notable for the following components:   APPearance HAZY (*)    Hgb urine dipstick SMALL (*)    Protein, ur >=300 (*)    Bacteria, UA RARE (*)    All other components within normal limits  CULTURE, BLOOD (ROUTINE X 2)  CULTURE, BLOOD (ROUTINE X  2)  LACTIC ACID, PLASMA  LACTIC ACID, PLASMA    EKG None  Radiology VAS [place] LOWER EXTREMITY VENOUS (DVT) (ONLY [facility] & [facility])  Result Date: 06/12/2022  Lower Venous DVT Study Patient Name:  HENOK DELOZIER  Date of Exam:   06/12/2022 Medical Rec #: [id number]           Accession #:    [id number] Date of Birth: 17-Feb-1956           Patient Gender: M Patient Age:   59 years Exam Location:  [facility] Procedure:      VAS [place] LOWER EXTREMITY VENOUS (DVT) Referring Phys: Herbie Baltimore Annalyse Langlais --------------------------------------------------------------------------------  Indications: Edema, and Pain.  Comparison Study: previous study 12/17/16 negative. Performing Technologist: McKayla Maag RVT, VT  Examination Guidelines: A complete evaluation includes B-mode imaging, spectral Doppler, color Doppler, and power Doppler as needed of all accessible portions of each vessel. Bilateral testing is considered an integral part of a complete examination. Limited examinations for reoccurring indications may be performed as noted. The reflux portion of the exam is performed with the patient in reverse Trendelenburg.  +--------+---------------+---------+-----------+----------+--------------------+ RIGHT   CompressibilityPhasicitySpontaneityPropertiesThrombus Aging       +--------+---------------+---------+-----------+----------+--------------------+ CFV     Full                                                               +--------+---------------+---------+-----------+----------+--------------------+ SFJ     Full                                                              +--------+---------------+---------+-----------+----------+--------------------+ FV Prox Full                                                              +--------+---------------+---------+-----------+----------+--------------------+ FV Mid  Full                                                              +--------+---------------+---------+-----------+----------+--------------------+ FV                                                   patent by color      Distal                                               doppler              +--------+---------------+---------+-----------+----------+--------------------+ PFV  Full                                                              +--------+---------------+---------+-----------+----------+--------------------+ POP     Full                                                              +--------+---------------+---------+-----------+----------+--------------------+ PTV     Full                                         Not well visualized  +--------+---------------+---------+-----------+----------+--------------------+ PERO    Full           Yes      Yes                  Not well visualized. +--------+---------------+---------+-----------+----------+--------------------+   +----+---------------+---------+-----------+----------+--------------+ LEFTCompressibilityPhasicitySpontaneityPropertiesThrombus Aging +----+---------------+---------+-----------+----------+--------------+ CFV Full           Yes      Yes                                 +----+---------------+---------+-----------+----------+--------------+ SFJ Full                                                         +----+---------------+---------+-----------+----------+--------------+    Summary: RIGHT: - There is no evidence of deep vein thrombosis in the lower extremity. However, portions of this examination were limited- see technologist comments above.  - No cystic structure found in the popliteal fossa. - Ultrasound characteristics of enlarged lymph nodes are noted in the groin.  LEFT: - No evidence of common femoral vein obstruction.  *See table(s) above for measurements and observations.    Preliminary    VAS [place] LOWER EXTREMITY ARTERIAL DUPLEX  Result Date: 06/12/2022 LOWER EXTREMITY ARTERIAL DUPLEX STUDY Patient Name:  VOLTAIRE LAWRANCE Pla  Date of Exam:   06/12/2022 Medical Rec #: [id number]           Accession #:    [id number] Date of Birth: January 17, 1956           Patient Gender: M Patient Age:   5 years Exam Location:  [facility] Procedure:      VAS [place] LOWER EXTREMITY ARTERIAL DUPLEX Referring Phys: Harold Barban --------------------------------------------------------------------------------  Indications: New sudden pain, redness and heat of the right leg.  Vascular Interventions: 04/04/2021                         Pre-operative Diagnosis: Severe right leg claudication                         Post-operative diagnosis: Same  Surgeon: [name]                         Assistants: Laurence Slate                         Procedure: #1: Right femoral to above-knee popliteal                         bypass graft with 9 reversed ipsilateral saphenous vein                         #2: Redo right femoral artery exposure. Current ABI:            R=0.96, L=0.43 Performing Technologist: Ronal Fear RVS, RCS  Examination Guidelines: A complete evaluation includes B-mode imaging, spectral Doppler, color Doppler, and power Doppler as needed of all accessible portions of each vessel. Bilateral testing is considered an integral part of a complete examination. Limited examinations for  reoccurring indications may be performed as noted.   Right Graft #1: femoropopliteal +------------------+--------+--------+----------+--------+                   PSV cm/sStenosisWaveform  Comments +------------------+--------+--------+----------+--------+ Inflow            65              biphasic           +------------------+--------+--------+----------+--------+ Prox Anastomosis  60              monophasic         +------------------+--------+--------+----------+--------+ Proximal Graft    57              monophasic         +------------------+--------+--------+----------+--------+ Mid Graft         124             biphasic           +------------------+--------+--------+----------+--------+ Distal Graft      92              monophasic         +------------------+--------+--------+----------+--------+ Distal Anastomosis101             monophasic         +------------------+--------+--------+----------+--------+ Outflow           126             monophasic         +------------------+--------+--------+----------+--------+   +----------+--------+-----+---------------+----------+--------+ LEFT      PSV cm/sRatioStenosis       Waveform  Comments +----------+--------+-----+---------------+----------+--------+ CFA Distal415          75-99% stenosismonophasic         +----------+--------+-----+---------------+----------+--------+ DFA       127                                            +----------+--------+-----+---------------+----------+--------+ SFA Prox  288          50-74% stenosismonophasic         +----------+--------+-----+---------------+----------+--------+ SFA Mid   26                          monophasic         +----------+--------+-----+---------------+----------+--------+ SFA Distal39  monophasic         +----------+--------+-----+---------------+----------+--------+ POP Prox  27                           monophasic         +----------+--------+-----+---------------+----------+--------+ POP Distal23                          monophasic         +----------+--------+-----+---------------+----------+--------+ ATA Distal24                          monophasic         +----------+--------+-----+---------------+----------+--------+ PTA Distal33                          monophasic         +----------+--------+-----+---------------+----------+--------+   Summary: Right: Widely patent bypass without stenosis. No evidence of DVT with limited scan. Left: 75-99% stenosis noted in the common femoral artery. 50-74% stenosis noted in the superficial femoral artery.  See table(s) above for measurements and observations.    Preliminary    VAS [place] ABI WITH/WO TBI  Result Date: 06/12/2022  LOWER EXTREMITY DOPPLER STUDY Patient Name:  AUBERT OKINO  Date of Exam:   06/12/2022 Medical Rec #: [id number]           Accession #:    [id number] Date of Birth: Jan 25, 1956           Patient Gender: M Patient Age:   33 years Exam Location:  [facility] Procedure:      VAS [place] ABI WITH/WO TBI Referring Phys: --------------------------------------------------------------------------------  Indications: Patient called triage complaining of extreme right lower extremity              pain, redness and swelling 3 days. Patient was scheduled for his 6              month follow up studies.  Vascular Interventions: 04/04/2021                         Pre-operative Diagnosis: Severe right leg claudication                         Post-operative diagnosis: Same                         Surgeon: [name]                         Assistants: Laurence Slate                         Procedure: #1: Right femoral to above-knee popliteal                         bypass graft with 9 reversed ipsilateral saphenous vein                         #2: Redo right femoral artery exposure. Performing Technologist: Ronal Fear RVS, RCS  Examination Guidelines: A complete evaluation includes at minimum, Doppler waveform signals and systolic blood pressure reading at the level of bilateral brachial, anterior tibial, and posterior tibial arteries, when vessel segments  are accessible. Bilateral testing is considered an integral part of a complete examination. Photoelectric Plethysmograph (PPG) waveforms and toe systolic pressure readings are included as required and additional duplex testing as needed. Limited examinations for reoccurring indications may be performed as noted.  ABI Findings: +---------+------------------+-----+----------+--------+ Right    Rt Pressure (mmHg)IndexWaveform  Comment  +---------+------------------+-----+----------+--------+ Brachial 163                                       +---------+------------------+-----+----------+--------+ PTA      150               0.91 monophasic         +---------+------------------+-----+----------+--------+ DP       157               0.96 biphasic           +---------+------------------+-----+----------+--------+ Great Toe122               0.74                    +---------+------------------+-----+----------+--------+ +---------+------------------+-----+----------+-------+ Left     Lt Pressure (mmHg)IndexWaveform  Comment +---------+------------------+-----+----------+-------+ Brachial 164                                      +---------+------------------+-----+----------+-------+ PTA      70                0.43 monophasic        +---------+------------------+-----+----------+-------+ DP       71                0.43 monophasic        +---------+------------------+-----+----------+-------+ Great Toe65                0.40                   +---------+------------------+-----+----------+-------+ +-------+-----------+-----------+------------+------------+ ABI/TBIToday's ABIToday's TBIPrevious ABIPrevious TBI  +-------+-----------+-----------+------------+------------+ Right  0.96       0.74       1.03        0.63         +-------+-----------+-----------+------------+------------+ Left   0.43       0.40       0.60        0.53         +-------+-----------+-----------+------------+------------+   Right ABIs appear essentially unchanged. Left ABIs appear decreased compared to prior study on 12/13/2021.  Summary: Right: Resting right ankle-brachial index is within normal range. The right toe-brachial index is normal. Left: Resting left ankle-brachial index indicates severe left lower extremity arterial disease. The left toe-brachial index is abnormal. *See table(s) above for measurements and observations.     Preliminary     Procedures Procedures   Medications Ordered in ED Medications  vancomycin (VANCOCIN) 2,500 mg in sodium chloride 0.9 % 500 mL IVPB (2,500 mg Intravenous New Bag/Given 06/12/22 1743)  vancomycin (VANCOREADY) IVPB 1250 mg/250 mL (has no administration in time range)  cefTRIAXone (ROCEPHIN) 1 g in sodium chloride 0.9 % 100 mL IVPB (0 g Intravenous Stopped 06/12/22 1756)  lactated ringers bolus 1,000 mL (0 mLs Intravenous Stopped 06/12/22 1836)    ED Course/ Medical Decision Making/ A&P Clinical Course as of 06/12/22 1859  Wed Jun 12, 2022  1552 Creatinine(!): 1.56 Baseline 0.89 [  RP]  1829 Dr Roel Cluck to admit.  [RP]    Clinical Course User Index [RP] [name], MD                            Medical Decision Making Amount and/or Complexity of Data Reviewed Labs: ordered. Decision-making details documented in ED Course. Radiology: ordered.  Risk Prescription drug management. Decision regarding hospitalization.   CHERRY MAZZAFERRO is a 67 y.o. male with comorbidities that complicate the patient evaluation including CAD status post PCI, COPD, diabetes, peripheral arterial disease status post right lower extremity bypass who presents emergency department with  right lower extremity pain, swelling, redness, and fevers.   Initial Ddx:  Cellulitis, necrotizing fasciitis, infected vascular graft, DVT  MDM:  Feel the patient has significant cellulitis.  No crepitance that would suggest necrotizing fasciitis or pain out of proportion to exam.  May potentially have an infected vascular graft since it streaks along the graft site.  Will consult vascular surgery regarding this.  Duplex ultrasound today at vascular clinic appears to be negative.  Given the extent of the cellulitis will admit for IV antibiotics.  Plan:  Labs Blood cultures IV antibiotics  ED Summary/Re-evaluation:  Patient reassessed in the emergency department.  Dr. Rachelle Hora from vascular surgery evaluated the patient.  Did not feel that CT imaging was warranted today but they will continue to monitor him.  He felt that the ultrasound for DVT was limited that was done in clinic so recommended repeat duplex today which did not show evidence of DVT.  Discussed with Dr. Roel Cluck for admission to medicine for IV antibiotics.  She requested that plain films of the patient's leg be obtained to rule out SQ gas. Was given fluids for aki.   This patient presents to the ED for concern of complaints listed in HPI, this involves an extensive number of treatment options, and is a complaint that carries with it a high risk of complications and morbidity. Disposition including potential need for admission considered.   Dispo: Admit to Floor  Records reviewed Outpatient Clinic Notes The following labs were independently interpreted: Chemistry and show AKI I personally reviewed and interpreted cardiac monitoring: normal sinus rhythm  I personally reviewed and interpreted the pt's EKG: see above for interpretation  I have reviewed the patients home medications and made adjustments as needed Consults: Hospitalist and Vascular Surgery Social Determinants of health:  Elderly   Final Clinical Impression(s)  / ED Diagnoses Final diagnoses:  Cellulitis of right lower extremity  AKI (acute kidney injury) [facility])    Rx / DC Orders ED Discharge Orders     None         [name], MD 06/12/22 [phone]

## 2022-06-12 NOTE — Assessment & Plan Note (Signed)
Continue albuterol as needed and home inhalers currently noncontributory

## 2022-06-12 NOTE — Assessment & Plan Note (Signed)
Was isolated one-time event.  Currently not on anticoagulation secondary risks of fall and bleeding continue Coreg currently appears to be in sinus rhythm but will obtain EKG and monitor

## 2022-06-13 ENCOUNTER — Encounter (HOSPITAL_COMMUNITY): Payer: Self-pay | Admitting: Internal Medicine

## 2022-06-13 ENCOUNTER — Ambulatory Visit: Payer: Medicare Other

## 2022-06-13 DIAGNOSIS — L03115 Cellulitis of right lower limb: Secondary | ICD-10-CM | POA: Diagnosis not present

## 2022-06-13 LAB — MAGNESIUM: Magnesium: 2 mg/dL (ref 1.7–2.4)

## 2022-06-13 LAB — CBC
HCT: 38.5 % — ABNORMAL LOW (ref 39.0–52.0)
Hemoglobin: 13 g/dL (ref 13.0–17.0)
MCH: 30.3 pg (ref 26.0–34.0)
MCHC: 33.8 g/dL (ref 30.0–36.0)
MCV: 89.7 fL (ref 80.0–100.0)
Platelets: 150 10*3/uL (ref 150–400)
RBC: 4.29 MIL/uL (ref 4.22–5.81)
RDW: 15.9 % — ABNORMAL HIGH (ref 11.5–15.5)
WBC: 14.7 10*3/uL — ABNORMAL HIGH (ref 4.0–10.5)
nRBC: 0 % (ref 0.0–0.2)

## 2022-06-13 LAB — PROCALCITONIN: Procalcitonin: 3.33 ng/mL

## 2022-06-13 LAB — VAS US ABI WITH/WO TBI
Left ABI: 0.43
Right ABI: 0.96

## 2022-06-13 LAB — COMPREHENSIVE METABOLIC PANEL
ALT: 12 U/L (ref 0–44)
AST: 18 U/L (ref 15–41)
Albumin: 2 g/dL — ABNORMAL LOW (ref 3.5–5.0)
Alkaline Phosphatase: 129 U/L — ABNORMAL HIGH (ref 38–126)
Anion gap: 10 (ref 5–15)
BUN: 22 mg/dL (ref 8–23)
CO2: 25 mmol/L (ref 22–32)
Calcium: 8.1 mg/dL — ABNORMAL LOW (ref 8.9–10.3)
Chloride: 102 mmol/L (ref 98–111)
Creatinine, Ser: 1.27 mg/dL — ABNORMAL HIGH (ref 0.61–1.24)
GFR, Estimated: >60 mL/min (ref >60)
Glucose, Bld: 99 mg/dL (ref 70–99)
Potassium: 3.8 mmol/L (ref 3.5–5.1)
Sodium: 137 mmol/L (ref 135–145)
Total Bilirubin: 0.7 mg/dL (ref 0.3–1.2)
Total Protein: 5.3 g/dL — ABNORMAL LOW (ref 6.5–8.1)

## 2022-06-13 LAB — GLUCOSE, CAPILLARY
Glucose-Capillary: 118 mg/dL — ABNORMAL HIGH (ref 70–99)
Glucose-Capillary: 103 mg/dL — ABNORMAL HIGH (ref 70–99)
Glucose-Capillary: 147 mg/dL — ABNORMAL HIGH (ref 70–99)
Glucose-Capillary: 108 mg/dL — ABNORMAL HIGH (ref 70–99)
Glucose-Capillary: 97 mg/dL (ref 70–99)

## 2022-06-13 LAB — C-REACTIVE PROTEIN: CRP: 21.1 mg/dL — ABNORMAL HIGH (ref <1.0)

## 2022-06-13 LAB — PREALBUMIN: Prealbumin: 8 mg/dL — ABNORMAL LOW (ref 18–38)

## 2022-06-13 LAB — HIV ANTIBODY (ROUTINE TESTING W REFLEX): HIV Screen 4th Generation wRfx: NONREACTIVE

## 2022-06-13 LAB — PHOSPHORUS: Phosphorus: 3.2 mg/dL (ref 2.5–4.6)

## 2022-06-13 LAB — SEDIMENTATION RATE: Sed Rate: 89 mm/hr — ABNORMAL HIGH (ref 0–16)

## 2022-06-13 LAB — ETHANOL: Alcohol, Ethyl (B): <10 mg/dL (ref <10)

## 2022-06-13 LAB — BRAIN NATRIURETIC PEPTIDE: B Natriuretic Peptide: 347.9 pg/mL — ABNORMAL HIGH (ref 0.0–100.0)

## 2022-06-13 MED ORDER — OXYCODONE-ACETAMINOPHEN 5-325 MG PO TABS
1.0000 | ORAL_TABLET | Freq: Four times a day (QID) | ORAL | Status: DC | PRN
  Administered 2022-06-14 – 2022-06-20 (×2): 1 via ORAL
  Filled 2022-06-13 (×2): qty 1

## 2022-06-13 MED ORDER — SODIUM CHLORIDE 0.9 % IV SOLN
2.0000 g | Freq: Three times a day (TID) | INTRAVENOUS | Status: DC
  Administered 2022-06-13 – 2022-06-20 (×21): 2 g via INTRAVENOUS
  Filled 2022-06-13 (×22): qty 12.5

## 2022-06-13 MED ORDER — ADULT MULTIVITAMIN W/MINERALS CH
1.0000 | ORAL_TABLET | Freq: Every day | ORAL | Status: DC
  Administered 2022-06-13 – 2022-06-20 (×8): 1 via ORAL
  Filled 2022-06-13 (×8): qty 1

## 2022-06-13 MED ORDER — ENSURE MAX PROTEIN PO LIQD
11.0000 [oz_av] | Freq: Every day | ORAL | Status: DC
  Administered 2022-06-13 – 2022-06-18 (×6): 11 [oz_av] via ORAL
  Filled 2022-06-13 (×8): qty 330

## 2022-06-13 MED ORDER — ASPIRIN 81 MG PO TBEC
81.0000 mg | DELAYED_RELEASE_TABLET | Freq: Every day | ORAL | Status: DC
  Administered 2022-06-13 – 2022-06-20 (×8): 81 mg via ORAL
  Filled 2022-06-13 (×8): qty 1

## 2022-06-13 MED ORDER — COLCHICINE 0.6 MG PO TABS
0.6000 mg | ORAL_TABLET | Freq: Every day | ORAL | Status: DC
  Administered 2022-06-13 – 2022-06-15 (×3): 0.6 mg via ORAL
  Filled 2022-06-13 (×8): qty 1

## 2022-06-13 MED ORDER — SODIUM CHLORIDE 0.9 % IV SOLN: INTRAVENOUS | Status: AC

## 2022-06-13 NOTE — Progress Notes (Signed)
Pharmacy Antibiotic Note  Joshua [name] is a 67 y.o. male admitted on 06/12/2022 with severe cellulitis.  Pharmacy has been consulted for Vancomycin and Cefepime dosing.  Creatinine has improved. Recalculated eAUC remains in target range.  Plan: Cefepime 2gm IV q12h > adjusted to q8hrs. Continue Vancomycin 1250 mg IV Q 24 hrs Goal AUC 400-550. Expected AUC: 447 SCr used: 1.27 Follow renal function, culture data, clinical progress and antibiotic plans. Will check vanc levels at steady state if indicated.   Height: 5\' 9"  (175.3 cm) Weight: 107.7 kg (237 lb 7 oz) IBW/kg (Calculated) : 70.7  Temp (24hrs), Avg:98.3 F (36.8 C), Min:98 F (36.7 C), Max:98.6 F (37 C)  Recent Labs  Lab 06/12/22 1345 06/12/22 1347 06/12/22 1620 06/12/22 1955 06/13/22 0422  WBC  --  16.8*  --   --  14.7*  CREATININE  --  1.56*  --  1.32* 1.27*  LATICACIDVEN 1.1  --  1.4  --   --     Estimated Creatinine Clearance: 69.2 mL/min (A) (by C-G formula based on SCr of 1.27 mg/dL (H)).    Allergies  Allergen Reactions   Zestril [Lisinopril] Hives and Itching   Contrast Media [Iodinated Contrast Media] Hives and Itching    OK if pt has Benadryl pre-med.   Elavil [Amitriptyline] Nausea Only and Other (See Comments)    Makes pt "feel weird"   Gadolinium Derivatives Other (See Comments)    Unknown reaction   Motrin [Ibuprofen] Other (See Comments)    Internal bleeding    Antimicrobials this admission: Ceftriaxone x 1 on 3/27 Vancomycin 3/27>> Cefepime 3/27>>  Dose adjustments this admission: 3/28: Cefepime 2gm IV q12h > q8h for improved renal function  Microbiology results: 3/27 blood: no growth <24 hrs to date MRSA PCR: pending  Thank you for allowing pharmacy to be a part of this patient's care.  Arty Baumgartner, [place] 06/13/2022 1:36 PM

## 2022-06-13 NOTE — Evaluation (Signed)
Physical Therapy Evaluation Patient Details Name: Joshua [name] MRN: [id number] DOB: Jan 28, 1956 Today's Date: 06/13/2022  History of Present Illness  68 y.o. male admitted to [facility] on 3/27 for 3 day history of RLE redness diagnosed as cellulitis and sent to ED by PCP. PMHx: PAD, s/p right femoral to above-the-knee popliteal bypass with vein January 2023, right common femoral endarterectomy October 2022, COPD, DM2, GERD, gout, HLD, HTN, Afib not on anticoagulation due to hx of GI bleed.  Clinical Impression  Pt presents today with impaired functional mobility, limited by strength, balance, and swelling and discomfort to RLE. At baseline pt is independent with mobility with intermittent use of SPC for shorter distances but utilizes a power wheelchair for the community. Pt requiring minG-minA for mobility, attempting brief trial of ambulation without AD but noted imbalance and reaching for UE support, improving with utilizing and able to progress to minG for safety and line management. Pt declined ambulation in the hallway, reporting mild fatigue from people stopping in all morning. Acute PT will continue to follow up with pt to progress mobility, recommend HHPT at discharge but may progress to no needs. Recommend RW as well for BUE support with ambulation, will continue to follow as appropriate.        Recommendations for follow up therapy are one component of a multi-disciplinary discharge planning process, led by the attending physician.  Recommendations may be updated based on patient status, additional functional criteria and insurance authorization.  Follow Up Recommendations       Assistance Recommended at Discharge Intermittent Supervision/Assistance  Patient can return home with the following  A little help with walking and/or transfers;Help with stairs or ramp for entrance    Equipment Recommendations Rolling walker (2 wheels)  Recommendations for Other Services       Functional  Status Assessment Patient has had a recent decline in their functional status and demonstrates the ability to make significant improvements in function in a reasonable and predictable amount of time.     Precautions / Restrictions Precautions Precautions: Fall Restrictions Weight Bearing Restrictions: No      Mobility  Bed Mobility Overal bed mobility: Modified Independent             General bed mobility comments: HOB elevated but completing supine<>sit    Transfers Overall transfer level: Needs assistance Equipment used: Rolling walker (2 wheels) Transfers: Sit to/from Stand Sit to Stand: Min guard           General transfer comment: minG for safety and line management    Ambulation/Gait Ambulation/Gait assistance: Min guard, Min assist Gait Distance (Feet): 15 Feet Assistive device: Rolling walker (2 wheels), None Gait Pattern/deviations: Step-through pattern, Decreased stride length, Antalgic, Trunk flexed, Drifts right/left Gait velocity: mildly decreased     General Gait Details: attempted a few feet without AD but pt with moderate imbalance and antalgic gait on RLE, reaching for UE support, gait smoother and balance improved with RW  Stairs            Wheelchair Mobility    Modified Rankin (Stroke Patients Only)       Balance Overall balance assessment: Needs assistance Sitting-balance support: No upper extremity supported, Feet supported Sitting balance-Leahy Scale: Good     Standing balance support: Bilateral upper extremity supported, During functional activity, Reliant on assistive device for balance Standing balance-Leahy Scale: Poor Standing balance comment: needs assist or use of RW for balance with ambulation  Pertinent Vitals/Pain Pain Assessment Pain Assessment: 0-10 Pain Score: 0-No pain    Home Living Family/patient expects to be discharged to:: Private residence Living Arrangements:  Spouse/significant other Available Help at Discharge: Family;Available PRN/intermittently Type of Home: House Home Access: Level entry       Home Layout: One level Home Equipment: Cane - single point;Wheelchair - power Additional Comments: Pt and wife reside at their home, pt reports wife works but available to assist intermittently    Prior Function Prior Level of Function : Independent/Modified Independent;Driving             Mobility Comments: pt utilizes SPC as needed outdoors, power wheelchair for [employer], often no AD in the home       Hand Dominance   Dominant Hand: Left    Extremity/Trunk Assessment   Upper Extremity Assessment Upper Extremity Assessment: Defer to OT evaluation    Lower Extremity Assessment Lower Extremity Assessment: Generalized weakness (swelling and redness to RLE, able to move against gravity but no resistance applied)       Communication   Communication: No difficulties  Cognition Arousal/Alertness: Awake/alert Behavior During Therapy: WFL for tasks assessed/performed Overall Cognitive Status: Within Functional Limits for tasks assessed                                 General Comments: A&Ox4, family not at bedside        General Comments General comments (skin integrity, edema, etc.): VSS on room air    Exercises     Assessment/Plan    PT Assessment Patient needs continued PT services  PT Problem List Decreased strength;Decreased activity tolerance;Decreased balance;Decreased mobility;Decreased knowledge of use of DME;Decreased safety awareness       PT Treatment Interventions DME instruction;Gait training;Functional mobility training;Therapeutic activities;Therapeutic exercise;Balance training;Neuromuscular re-education;Patient/family education    PT Goals (Current goals can be found in the Care Plan section)  Acute Rehab PT Goals Patient Stated Goal: go home PT Goal Formulation: With  patient Time For Goal Achievement: 06/27/22 Potential to Achieve Goals: Good    Frequency Min 3X/week     Co-evaluation               AM-PAC PT "6 Clicks" Mobility  Outcome Measure Help needed turning from your back to your side while in a flat bed without using bedrails?: None Help needed moving from lying on your back to sitting on the side of a flat bed without using bedrails?: None Help needed moving to and from a bed to a chair (including a wheelchair)?: A Little Help needed standing up from a chair using your arms (e.g., wheelchair or bedside chair)?: A Little Help needed to walk in hospital room?: A Little Help needed climbing 3-5 steps with a railing? : A Lot 6 Click Score: 19    End of Session Equipment Utilized During Treatment: Gait belt Activity Tolerance: Patient tolerated treatment well Patient left: in bed;with call bell/phone within reach;with bed alarm set Nurse Communication: Mobility status PT Visit Diagnosis: Unsteadiness on feet (R26.81);Muscle weakness (generalized) (M62.81);Difficulty in walking, not elsewhere classified (R26.2)    Time: [id number] PT Time Calculation (min) (ACUTE ONLY): 12 min   Charges:   PT Evaluation $PT Eval Low Complexity: 1 Low          Charlynne Cousins, PT DPT Acute Rehabilitation Services Office [phone]   Luvenia Heller 06/13/2022, 1:46 PM

## 2022-06-13 NOTE — Progress Notes (Signed)
Subjective  -   Says his right legs feels a little better   Physical Exam:  Markings surrounding the erythema do not show any evidence that the redness has spread.  The right leg remains well-perfused but edematous and tender   CK: 48 WBC: 14.7 Sed Rate:  89  Assessment/Plan:    Right leg cellulitis: Unclear etiology.  He has profound right leg erythema and swelling.  The erythema has not expanded.  X-rays yesterday were negative for soft tissue gas.  I suspect this is just an underlying cellulitis that should resolve with antibiotics and leg elevation.  At this time I do not suspect a necrotizing infection, but he will need to be monitored closely.  Joshua [name] 06/13/2022 7:59 AM --  Vitals:   06/12/22 2153 06/13/22 0323  BP: (!) 186/75 (!) 120/49  Pulse: 68 60  Resp: 20 18  Temp: 98.4 F (36.9 C) 98.6 F (37 C)  SpO2: 95%     Intake/Output Summary (Last 24 hours) at 06/13/2022 0759 Last data filed at 06/13/2022 0400 Gross per 24 hour  Intake 600 ml  Output 500 ml  Net 100 ml     Laboratory CBC    Component Value Date/Time   WBC 14.7 (H) 06/13/2022 0422   HGB 13.0 06/13/2022 0422   HGB 13.6 12/19/2020 1110   HGB 19.2 (H) 11/25/2016 1430   HCT 38.5 (L) 06/13/2022 0422   HCT 38.9 12/19/2020 1110   HCT 54.8 (H) 11/25/2016 1430   PLT 150 06/13/2022 0422   PLT 250 12/19/2020 1110    BMET    Component Value Date/Time   NA 137 06/13/2022 0422   NA 137 12/19/2020 1110   NA 140 11/25/2016 1430   K 3.8 06/13/2022 0422   K 4.3 11/25/2016 1430   CL 102 06/13/2022 0422   CO2 25 06/13/2022 0422   CO2 27 11/25/2016 1430   GLUCOSE 99 06/13/2022 0422   GLUCOSE 101 11/25/2016 1430   BUN 22 06/13/2022 0422   BUN 9 12/19/2020 1110   BUN 12.6 11/25/2016 1430   CREATININE 1.27 (H) 06/13/2022 0422   CREATININE 0.92 07/09/2019 1435   CREATININE 0.9 11/25/2016 1430   CALCIUM 8.1 (L) 06/13/2022 0422   CALCIUM 10.3 11/25/2016 1430   GFRNONAA >60 06/13/2022  0422   GFRAA >60 07/09/2018 1610    COAG Lab Results  Component Value Date   INR 0.9 06/12/2022   INR 0.9 04/03/2021   INR 0.8 11/14/2020   No results found for: "PTT"  Antibiotics Anti-infectives (From admission, onward)    Start     Dose/Rate Route Frequency Ordered Stop   06/13/22 1730  vancomycin (VANCOREADY) IVPB 1250 mg/250 mL        1,250 mg 166.7 mL/hr over 90 Minutes Intravenous Every 24 hours 06/12/22 1638     06/12/22 2000  ceFEPIme (MAXIPIME) 2 g in sodium chloride 0.9 % 100 mL IVPB        2 g 200 mL/hr over 30 Minutes Intravenous Every 12 hours 06/12/22 1956     06/12/22 1730  vancomycin (VANCOCIN) 2,500 mg in sodium chloride 0.9 % 500 mL IVPB        2,500 mg 262.5 mL/hr over 120 Minutes Intravenous  Once 06/12/22 1621 06/12/22 1943   06/12/22 1600  cefTRIAXone (ROCEPHIN) 1 g in sodium chloride 0.9 % 100 mL IVPB        1 g 200 mL/hr over 30 Minutes Intravenous  Once 06/12/22 1555  06/12/22 1756        V. Leia Alf, M.D., [facility] [place] Office: [phone] Pager:  [phone]

## 2022-06-13 NOTE — Progress Notes (Signed)
PROGRESS NOTE                                                                                                                                                                                                             Patient Demographics:    Joshua [name], is a 67 y.o. male, DOB - 02-05-1956, [id number]  Outpatient Primary MD for the patient is Sandi Mariscal, MD    LOS - 1  Admit date - 06/12/2022    Chief Complaint  Patient presents with   Sent by PCP   Leg Pain       Brief Narrative (HPI from H&P)    67 y.o. male with medical history significant of PAD, Status post right femoral to above-the-knee popliteal bypass with vein by Dr. Trula Slade done in January 2023, right common femoral endarterectomy by Dr. Trula Slade done in October 2022, COPD, DM2, GERD, gout, HLD, HTN. Afib not on anticoagulation due to hx of GI bleed who presented to the hospital with 3-day history of right lower extremity redness, was seen in vascular surgery office where he was diagnosed with severe right lower extremity cellulitis and admitted to the hospital.   Subjective:    Joshua [name] today has, No headache, No chest pain, No abdominal pain - No Nausea, No new weakness tingling or numbness, no SOB   Assessment  & Plan :    Severe right lower extremity cellulitis.  Thankfully his ABIs and lower extremity venous duplex are nonacute, seen by vascular surgery Case discussed with vascular surgery on 06/13/2022, he is on empiric IV antibiotics after which she already feels better, continue to monitor cultures and inflammatory markers.  Will monitor closely.  PAD s/p right femoropopliteal breast bypass and right common femoral endarterectomy both over 1 year ago.  No acute issues on exam, continue aspirin and statin for secondary prevention, vascular surgery following.  ABIs noted along with lower extremity venous duplex.  Both appear nonacute.  CAD in  native artery, occluded Lcx and rotational atherectomy to LAD with DES 05/17/15 - no acute issues, chest pain-free, continue aspirin, Crestor 40 mg daily and Zetia 10 mg a day continue Coreg 12.5 mg twice daily.   History of paroxysmal atrial fibrillation.  Mali vas 2 score of greater than 3.  Continue Coreg, not on anticoagulation due to history of GI bleed.  Hypertension.  On Coreg.  Monitor and adjust.  COPD.  No acute issues.  Supportive care.  Dyslipidemia.  On statin along with Zetia.  GERD and history of GI bleed.  On PPI.  Hypokalemia, hyponatremia and AKI.  Improving with hydration, electrolytes replaced.  Smoking.  Counseled to quit.  He refused nicotine patch.    DM type II.  Hold p.o. medications.  Sliding scale.  Lab Results  Component Value Date   HGBA1C 5.8 (H) 04/05/2021   CBG (last 3)  Recent Labs    06/12/22 2242 06/13/22 0345  GLUCAP 129* 71*        Condition - Fair  Family Communication  : Present  Code Status : Full code  Consults  : Vascular surgery  PUD Prophylaxis : PPI   Procedures  :     Bilateral lower extremity venous duplex.  No DVT.  Bilateral lower extremity ABI.  Normal on the right side, severe PAD on the left.      Disposition Plan  :    Status is: Inpatient  DVT Prophylaxis  :  Heparin  SCDs Start: 06/12/22 2154   Lab Results  Component Value Date   PLT 150 06/13/2022    Diet :  Diet Order             Diet heart healthy/carb modified Room service appropriate? Yes; Fluid consistency: Thin  Diet effective now                    Inpatient Medications  Scheduled Meds:  aspirin EC  81 mg Oral Daily   carvedilol  12.5 mg Oral BID   colchicine  0.6 mg Oral Daily   ezetimibe  10 mg Oral QPM   fluticasone furoate-vilanterol  1 puff Inhalation Daily   And   umeclidinium bromide  1 puff Inhalation Daily   insulin aspart  0-9 Units Subcutaneous Q4H   pantoprazole  40 mg Oral Daily   pregabalin  100 mg Oral  TID   rosuvastatin  40 mg Oral QPM   traZODone  100 mg Oral QHS   Continuous Infusions:  sodium chloride     ceFEPime (MAXIPIME) IV Stopped (06/12/22 2055)   methocarbamol (ROBAXIN) IV     vancomycin     PRN Meds:.acetaminophen **OR** acetaminophen, albuterol, fentaNYL (SUBLIMAZE) injection, HYDROcodone-acetaminophen, methocarbamol (ROBAXIN) IV, ondansetron **OR** ondansetron (ZOFRAN) IV, oxyCODONE-acetaminophen  Antibiotics  :    Anti-infectives (From admission, onward)    Start     Dose/Rate Route Frequency Ordered Stop   06/13/22 1730  vancomycin (VANCOREADY) IVPB 1250 mg/250 mL        1,250 mg 166.7 mL/hr over 90 Minutes Intravenous Every 24 hours 06/12/22 1638     06/12/22 2000  ceFEPIme (MAXIPIME) 2 g in sodium chloride 0.9 % 100 mL IVPB        2 g 200 mL/hr over 30 Minutes Intravenous Every 12 hours 06/12/22 1956     06/12/22 1730  vancomycin (VANCOCIN) 2,500 mg in sodium chloride 0.9 % 500 mL IVPB        2,500 mg 262.5 mL/hr over 120 Minutes Intravenous  Once 06/12/22 1621 06/12/22 1943   06/12/22 1600  cefTRIAXone (ROCEPHIN) 1 g in sodium chloride 0.9 % 100 mL IVPB        1 g 200 mL/hr over 30 Minutes Intravenous  Once 06/12/22 1555 06/12/22 1756         Objective:   Vitals:   06/12/22 2000 06/12/22  2031 06/12/22 2153 06/13/22 0323  BP:   (!) 186/75 (!) 120/49  Pulse: (!) 57  68 60  Resp:   20 18  Temp:  98.4 F (36.9 C) 98.4 F (36.9 C) 98.6 F (37 C)  TempSrc:   Oral Oral  SpO2:   95%   Weight:   107.7 kg   Height:   5\' 9"  (1.753 m)     Wt Readings from Last 3 Encounters:  06/12/22 107.7 kg  06/12/22 107 kg  12/13/21 111.3 kg     Intake/Output Summary (Last 24 hours) at 06/13/2022 0759 Last data filed at 06/13/2022 0400 Gross per 24 hour  Intake 600 ml  Output 500 ml  Net 100 ml     Physical Exam  Awake Alert, No new F.N deficits, Normal affect .AT,PERRAL Supple Neck, No JVD,   Symmetrical Chest wall movement, Good air movement  bilaterally, CTAB RRR,No Gallops,Rubs or new Murmurs,  +ve B.Sounds, Abd Soft, No tenderness,   R leg thigh to toes red and warm  Picture from 06/12/22      Data Review:    Recent Labs  Lab 06/12/22 1347 06/13/22 0422  WBC 16.8* 14.7*  HGB 14.2 13.0  HCT 41.8 38.5*  PLT 158 150  MCV 89.3 89.7  MCH 30.3 30.3  MCHC 34.0 33.8  RDW 15.8* 15.9*  LYMPHSABS 1.2  --   MONOABS 1.2*  --   EOSABS 0.1  --   BASOSABS 0.0  --     Recent Labs  Lab 06/12/22 1345 06/12/22 1347 06/12/22 1620 06/12/22 1951 06/12/22 1955 06/13/22 0422  NA  --  132*  --   --  135 137  K  --  3.9  --   --  3.4* 3.8  CL  --  96*  --   --  97* 102  CO2  --  25  --   --  25 25  ANIONGAP  --  11  --   --  13 10  GLUCOSE  --  127*  --   --  93 99  BUN  --  29*  --   --  24* 22  CREATININE  --  1.56*  --   --  1.32* 1.27*  AST  --  21  --   --   --  18  ALT  --  11  --   --   --  12  ALKPHOS  --  137*  --   --   --  129*  BILITOT  --  0.8  --   --   --  0.7  ALBUMIN  --  2.3*  --   --   --  2.0*  LATICACIDVEN 1.1  --  1.4  --   --   --   INR  --   --   --   --  0.9  --   TSH  --   --   --  1.901  --   --   MG  --   --   --   --  1.8 2.0  CALCIUM  --  8.3*  --   --  7.8* 8.1*      Recent Labs  Lab 06/12/22 1345 06/12/22 1347 06/12/22 1620 06/12/22 1951 06/12/22 1955 06/13/22 0422  LATICACIDVEN 1.1  --  1.4  --   --   --   INR  --   --   --   --  0.9  --   TSH  --   --   --  1.901  --   --   MG  --   --   --   --  1.8 2.0  CALCIUM  --  8.3*  --   --  7.8* 8.1*   Lab Results  Component Value Date   HGBA1C 5.8 (H) 04/05/2021   Recent Labs    06/12/22 1951  TSH 1.901   Micro Results Recent Results (from the past 240 hour(s))  Blood culture (routine x 2)     Status: None (Preliminary result)   Collection Time: 06/12/22  4:20 PM   Specimen: BLOOD  Result Value Ref Range Status   Specimen Description BLOOD RIGHT ANTECUBITAL  Final   Special Requests   Final    BOTTLES DRAWN  AEROBIC AND ANAEROBIC Blood Culture adequate volume   Culture   Final    NO GROWTH < 24 HOURS Performed at [facility], [place] [address], [place], [place] [address]    Report Status PENDING  Incomplete  Blood culture (routine x 2)     Status: None (Preliminary result)   Collection Time: 06/12/22  4:40 PM   Specimen: BLOOD  Result Value Ref Range Status   Specimen Description BLOOD LEFT ANTECUBITAL  Final   Special Requests   Final    BOTTLES DRAWN AEROBIC AND ANAEROBIC Blood Culture adequate volume   Culture   Final    NO GROWTH < 24 HOURS Performed at [facility], [place] [address], [place], [place] [address]    Report Status PENDING  Incomplete    Radiology Reports DG CHEST PORT 1 VIEW  Result Date: 06/12/2022 CLINICAL DATA:  Hyponatremia EXAM: PORTABLE CHEST 1 VIEW COMPARISON:  Previous studies including the examination of 11/14/2020 FINDINGS: Cardiac size is within normal limits. There are no signs of pulmonary edema. There is linear bandlike density in the right parahilar region with no significant change suggesting possible scarring from previous intervention. There are no new infiltrates. There is no pleural effusion or pneumothorax. There is surgical fusion in cervical spine. IMPRESSION: Linear density in right parahilar region has not changed significantly suggesting scarring from previous intervention. There are no new infiltrates or signs of pulmonary edema. There is no pleural effusion or pneumothorax. Electronically Signed   By: Elmer Picker M.D.   On: 06/12/2022 20:19   DG Femur Min 2 Views Right  Result Date: 06/12/2022 CLINICAL DATA:  Right leg pain for several days, no known injury, initial encounter EXAM: RIGHT FEMUR 2 VIEWS COMPARISON:  None Available. FINDINGS: Degenerative changes of the right hip joint are noted. Diffuse vascular calcifications are seen. No acute fracture or dislocation is noted. No soft tissue changes are seen. IMPRESSION: No acute  abnormality noted. Electronically Signed   By: Inez Catalina M.D.   On: 06/12/2022 19:11   DG Tibia/Fibula Right  Result Date: 06/12/2022 CLINICAL DATA:  Right leg pain, swelling, and redness. Chills, weight loss and pain. EXAM: RIGHT TIBIA AND FIBULA - 2 VIEW COMPARISON:  11/15/2020 FINDINGS: Diffuse soft tissue swelling and edema throughout the visualized right lower extremity. No radiopaque soft tissue foreign bodies or soft tissue gas collections. Surgical clips medial to the right knee. Vascular calcifications. Bones appear intact. No evidence of acute fracture or dislocation of the right tibia or fibula. No focal bone lesion or bone destruction. IMPRESSION: Diffuse soft tissue swelling.  No acute bony abnormalities. Electronically Signed   By:  Lucienne Capers M.D.   On: 06/12/2022 19:05   VAS [place] LOWER EXTREMITY VENOUS (DVT) (ONLY [facility] & [facility])  Result Date: 06/12/2022  Lower Venous DVT Study Patient Name:  Joshua [name]  Date of Exam:   06/12/2022 Medical Rec #: [id number]           Accession #:    [id number] Date of Birth: [birth year]           Patient Gender: M Patient Age:   32 years Exam Location:  [facility] Procedure:      VAS [place] LOWER EXTREMITY VENOUS (DVT) Referring Phys: Herbie Baltimore PATERSON --------------------------------------------------------------------------------  Indications: Edema, and Pain.  Comparison Study: previous study 12/17/16 negative. Performing Technologist: McKayla Maag RVT, VT  Examination Guidelines: A complete evaluation includes B-mode imaging, spectral Doppler, color Doppler, and power Doppler as needed of all accessible portions of each vessel. Bilateral testing is considered an integral part of a complete examination. Limited examinations for reoccurring indications may be performed as noted. The reflux portion of the exam is performed with the patient in reverse Trendelenburg.  +--------+---------------+---------+-----------+----------+--------------------+ RIGHT    CompressibilityPhasicitySpontaneityPropertiesThrombus Aging       +--------+---------------+---------+-----------+----------+--------------------+ CFV     Full                                                              +--------+---------------+---------+-----------+----------+--------------------+ SFJ     Full                                                              +--------+---------------+---------+-----------+----------+--------------------+ FV Prox Full                                                              +--------+---------------+---------+-----------+----------+--------------------+ FV Mid  Full                                                              +--------+---------------+---------+-----------+----------+--------------------+ FV                                                   patent by color      Distal                                               doppler              +--------+---------------+---------+-----------+----------+--------------------+ PFV     Full                                                              +--------+---------------+---------+-----------+----------+--------------------+  POP     Full                                                              +--------+---------------+---------+-----------+----------+--------------------+ PTV     Full                                         Not well visualized  +--------+---------------+---------+-----------+----------+--------------------+ PERO    Full           Yes      Yes                  Not well visualized. +--------+---------------+---------+-----------+----------+--------------------+   +----+---------------+---------+-----------+----------+--------------+ LEFTCompressibilityPhasicitySpontaneityPropertiesThrombus Aging +----+---------------+---------+-----------+----------+--------------+ CFV Full           Yes      Yes                                  +----+---------------+---------+-----------+----------+--------------+ SFJ Full                                                        +----+---------------+---------+-----------+----------+--------------+     Summary: RIGHT: - There is no evidence of deep vein thrombosis in the lower extremity. However, portions of this examination were limited- see technologist comments above.  - No cystic structure found in the popliteal fossa. - Ultrasound characteristics of enlarged lymph nodes are noted in the groin.  LEFT: - No evidence of common femoral vein obstruction.  *See table(s) above for measurements and observations. Electronically signed by Deitra Mayo MD on 06/12/2022 at 7:05:14 PM.    Final    VAS [place] LOWER EXTREMITY ARTERIAL DUPLEX  Result Date: 06/12/2022 LOWER EXTREMITY ARTERIAL DUPLEX STUDY Patient Name:  Joshua [name]  Date of Exam:   06/12/2022 Medical Rec #: [id number]           Accession #:    [id number] Date of Birth: January 04, 1956           Patient Gender: M Patient Age:   68 years Exam Location:  [facility] Procedure:      VAS [place] LOWER EXTREMITY ARTERIAL DUPLEX Referring Phys: Harold Barban --------------------------------------------------------------------------------  Indications: New sudden pain, redness and heat of the right leg.  Vascular Interventions: 04/04/2021                         Pre-operative Diagnosis: Severe right leg claudication                         Post-operative diagnosis: Same                         Surgeon: [name]                         Assistants: Laurence Slate  Procedure: #1: Right femoral to above-knee popliteal                         bypass graft with 9 reversed ipsilateral saphenous vein                         #2: Redo right femoral artery exposure. Current ABI:            R=0.96, L=0.43 Performing Technologist: Ronal Fear RVS, RCS  Examination Guidelines: A complete  evaluation includes B-mode imaging, spectral Doppler, color Doppler, and power Doppler as needed of all accessible portions of each vessel. Bilateral testing is considered an integral part of a complete examination. Limited examinations for reoccurring indications may be performed as noted.   Right Graft #1: femoropopliteal +------------------+--------+--------+----------+--------+                   PSV cm/sStenosisWaveform  Comments +------------------+--------+--------+----------+--------+ Inflow            65              biphasic           +------------------+--------+--------+----------+--------+ Prox Anastomosis  60              monophasic         +------------------+--------+--------+----------+--------+ Proximal Graft    57              monophasic         +------------------+--------+--------+----------+--------+ Mid Graft         124             biphasic           +------------------+--------+--------+----------+--------+ Distal Graft      92              monophasic         +------------------+--------+--------+----------+--------+ Distal Anastomosis101             monophasic         +------------------+--------+--------+----------+--------+ Outflow           126             monophasic         +------------------+--------+--------+----------+--------+   +----------+--------+-----+---------------+----------+--------+ LEFT      PSV cm/sRatioStenosis       Waveform  Comments +----------+--------+-----+---------------+----------+--------+ CFA Distal415          75-99% stenosismonophasic         +----------+--------+-----+---------------+----------+--------+ DFA       127                                            +----------+--------+-----+---------------+----------+--------+ SFA Prox  288          50-74% stenosismonophasic         +----------+--------+-----+---------------+----------+--------+ SFA Mid   26                           monophasic         +----------+--------+-----+---------------+----------+--------+ SFA Distal39                          monophasic         +----------+--------+-----+---------------+----------+--------+ POP Prox  27  monophasic         +----------+--------+-----+---------------+----------+--------+ POP Distal23                          monophasic         +----------+--------+-----+---------------+----------+--------+ ATA Distal24                          monophasic         +----------+--------+-----+---------------+----------+--------+ PTA Distal33                          monophasic         +----------+--------+-----+---------------+----------+--------+   Summary: Right: Widely patent bypass without stenosis. No evidence of DVT with limited scan. Left: 75-99% stenosis noted in the common femoral artery. 50-74% stenosis noted in the superficial femoral artery.  See table(s) above for measurements and observations.    Preliminary    VAS [place] ABI WITH/WO TBI  Result Date: 06/12/2022  LOWER EXTREMITY DOPPLER STUDY Patient Name:  Joshua [name]  Date of Exam:   06/12/2022 Medical Rec #: [id number]           Accession #:    [id number] Date of Birth: 05/24/1955           Patient Gender: M Patient Age:   28 years Exam Location:  [facility] Procedure:      VAS [place] ABI WITH/WO TBI Referring Phys: --------------------------------------------------------------------------------  Indications: Patient called triage complaining of extreme right lower extremity              pain, redness and swelling 3 days. Patient was scheduled for his 6              month follow up studies.  Vascular Interventions: 04/04/2021                         Pre-operative Diagnosis: Severe right leg claudication                         Post-operative diagnosis: Same                         Surgeon: [name]                         Assistants: Laurence Slate                          Procedure: #1: Right femoral to above-knee popliteal                         bypass graft with 9 reversed ipsilateral saphenous vein                         #2: Redo right femoral artery exposure. Performing Technologist: Ronal Fear RVS, RCS  Examination Guidelines: A complete evaluation includes at minimum, Doppler waveform signals and systolic blood pressure reading at the level of bilateral brachial, anterior tibial, and posterior tibial arteries, when vessel segments are accessible. Bilateral testing is considered an integral part of a complete examination. Photoelectric Plethysmograph (PPG) waveforms and toe systolic pressure readings are included as required and additional duplex testing as needed. Limited examinations for reoccurring indications may be performed  as noted.  ABI Findings: +---------+------------------+-----+----------+--------+ Right    Rt Pressure (mmHg)IndexWaveform  Comment  +---------+------------------+-----+----------+--------+ Brachial 163                                       +---------+------------------+-----+----------+--------+ PTA      150               0.91 monophasic         +---------+------------------+-----+----------+--------+ DP       157               0.96 biphasic           +---------+------------------+-----+----------+--------+ Great Toe122               0.74                    +---------+------------------+-----+----------+--------+ +---------+------------------+-----+----------+-------+ Left     Lt Pressure (mmHg)IndexWaveform  Comment +---------+------------------+-----+----------+-------+ Brachial 164                                      +---------+------------------+-----+----------+-------+ PTA      70                0.43 monophasic        +---------+------------------+-----+----------+-------+ DP       71                0.43 monophasic         +---------+------------------+-----+----------+-------+ Great Toe65                0.40                   +---------+------------------+-----+----------+-------+ +-------+-----------+-----------+------------+------------+ ABI/TBIToday's ABIToday's TBIPrevious ABIPrevious TBI +-------+-----------+-----------+------------+------------+ Right  0.96       0.74       1.03        0.63         +-------+-----------+-----------+------------+------------+ Left   0.43       0.40       0.60        0.53         +-------+-----------+-----------+------------+------------+   Right ABIs appear essentially unchanged. Left ABIs appear decreased compared to prior study on 12/13/2021.  Summary: Right: Resting right ankle-brachial index is within normal range. The right toe-brachial index is normal. Left: Resting left ankle-brachial index indicates severe left lower extremity arterial disease. The left toe-brachial index is abnormal. *See table(s) above for measurements and observations.     Preliminary       Signature  -   Lala Lund M.D on 06/13/2022 at 7:59 AM   -  To page go to [email or web address]

## 2022-06-13 NOTE — Progress Notes (Signed)
Initial Nutrition Assessment  DOCUMENTATION CODES:   Not applicable  INTERVENTION:   Multivitamin w/ minerals daily Ensure Max po Daily, each supplement provides 150 kcal and 30 grams of protein.  Encourage good PO intake   NUTRITION DIAGNOSIS:   Increased nutrient needs related to acute illness (Cellulitis) as evidenced by estimated needs.  GOAL:   Patient will meet greater than or equal to 90% of their needs  MONITOR:   PO intake, Supplement acceptance, Labs, Skin  REASON FOR ASSESSMENT:   Consult Assessment of nutrition requirement/status  ASSESSMENT:   67 y.o. male presented to the ED with RLE swelling, pain and edema. PMH includes COPD, T2DM, PAD, CAD, HTN, HLD, gout, GERD, and tobacco abuse. Pt admitted with cellulitis of RLE and AKI.   Met with pt and spouse at beside. Pt reports that his appetite has decreased since trying to lose weight over the past few months. States that he has had a decrease in sugar cravings and overall hunger. Reports typically eating two meals per day, late breakfast and early dinner. Drinking about 1 Premier Protein every other day, also likes Atkins shakes. Denies any N/V. Reports that he has loss 16# over the past three months, but has been trying to and was happy to lose the weight. Starting weight was near 250#. Reports that he is not working out just changed eating habits. Reports using a cane or powered wheelchair for longer distances.   Discussed using Ensure Max in hospital for increased needs due to acute illness, pt agreeable.   Medications reviewed and include: NovoLog SSI, Protonix, IV antibiotics  Labs reviewed: Sodium 137, Potassium 3.8, Phosphorus 3.2, Magnesium 2.0, 24 hr CBGs 103-129  NUTRITION - FOCUSED PHYSICAL EXAM:  Flowsheet Row Most Recent Value  Orbital Region No depletion  Upper Arm Region No depletion  Thoracic and Lumbar Region No depletion  Buccal Region No depletion  Temple Region No depletion  Clavicle  Bone Region No depletion  Clavicle and Acromion Bone Region No depletion  Scapular Bone Region No depletion  Dorsal Hand No depletion  Patellar Region No depletion  Anterior Thigh Region No depletion  Posterior Calf Region No depletion  Edema (RD Assessment) Mild  Hair Reviewed  Eyes Reviewed  Mouth Reviewed  Skin Reviewed  Nails Reviewed   Diet Order:   Diet Order             Diet heart healthy/carb modified Room service appropriate? Yes; Fluid consistency: Thin  Diet effective now                   EDUCATION NEEDS:   Education needs have been addressed  Skin:  Skin Assessment: Reviewed RN Assessment  Last BM:  3/27  Height:  Ht Readings from Last 1 Encounters:  06/12/22 5\' 9"  (1.753 m)   Weight:  Wt Readings from Last 1 Encounters:  06/12/22 107.7 kg   Ideal Body Weight:  72.7 kg  BMI:  Body mass index is 35.06 kg/m.  Estimated Nutritional Needs:  Kcal:  [id number] Protein:  115-130 grams Fluid:  >/= 2 L   Hermina Barters RD, LDN Clinical Dietitian See [facility] for contact information.

## 2022-06-13 NOTE — Evaluation (Signed)
Occupational Therapy Evaluation Patient Details Name: Joshua [name] MRN: [id number] DOB: 11/18/1955 Today's Date: 06/13/2022   History of Present Illness 67 y.o. male with medical history significant of PAD, Status post right femoral to above-the-knee popliteal bypass with vein by Dr. Trula Slade done in January 2023, right common femoral endarterectomy by Dr. Trula Slade done in October 2022, COPD, DM2, GERD, gout, HLD, HTN. Afib not on anticoagulation due to hx of GI bleed who presented to the hospital with 3-day history of right lower extremity redness, was seen in vascular surgery office where he was diagnosed with severe right lower extremity cellulitis and admitted to the hospital.   Clinical Impression   Pt. Was able to perform ADLs while sitting EOB without assist or LOB. Pt. Was able to stand without assist from bed and AMB around bed without assist. Discussed with pt. And wife about pt. Functional performance and they are in agreement that pt. Does not need further OT at this time.      Recommendations for follow up therapy are one component of a multi-disciplinary discharge planning process, led by the attending physician.  Recommendations may be updated based on patient status, additional functional criteria and insurance authorization.   Assistance Recommended at Discharge PRN  Patient can return home with the following Assistance with cooking/housework    Functional Status Assessment  Patient has had a recent decline in their functional status and demonstrates the ability to make significant improvements in function in a reasonable and predictable amount of time.  Equipment Recommendations   (discussed with pt. and wife a tub transfer bench and wife will order later if she desires.)    Recommendations for Other Services       Precautions / Restrictions Precautions Precautions: Fall Restrictions Weight Bearing Restrictions: No      Mobility Bed Mobility Overal bed  mobility: Modified Independent                  Transfers Overall transfer level: Modified independent                 General transfer comment: Mod I with sit to stand and to amb around bed.      Balance Overall balance assessment: No apparent balance deficits (not formally assessed)                                         ADL either performed or assessed with clinical judgement   ADL Overall ADL's : At baseline                                       General ADL Comments: Pt. is able to perform adl tasks and adl transfsers.     Vision Baseline Vision/History: 1 Wears glasses Ability to See in Adequate Light: 0 Adequate Patient Visual Report: No change from baseline Vision Assessment?: No apparent visual deficits     Perception     Praxis      Pertinent Vitals/Pain Pain Assessment Pain Assessment: 0-10 Pain Score: 9  Pain Location: neck and back     Hand Dominance Left   Extremity/Trunk Assessment Upper Extremity Assessment Upper Extremity Assessment: Generalized weakness           Communication Communication Communication: No difficulties   Cognition Arousal/Alertness: Awake/alert Behavior During Therapy: [facility]  for tasks assessed/performed Overall Cognitive Status: Within Functional Limits for tasks assessed                                       General Comments       Exercises     Shoulder Instructions      Home Living Family/patient expects to be discharged to:: Private residence Living Arrangements: Spouse/significant other Available Help at Discharge: Family;Available 24 hours/day Type of Home: House Home Access: Stairs to enter [employer] of Steps: 1 (half step per pt.)   Home Layout: One level     Bathroom Shower/Tub: Teacher, early years/pre: Standard     Home Equipment: Cane - single point;Wheelchair - power   Additional Comments: Pt. and wife  ed on tub transfer bench.      Prior Functioning/Environment Prior Level of Function : Independent/Modified Independent;Driving             Mobility Comments: PRN cane. powe chair for long distance. ADLs Comments: Pt. was i with ADLs.        OT Problem List: Pain      OT Treatment/Interventions:      OT Goals(Current goals can be found in the care plan section) Acute Rehab OT Goals Patient Stated Goal: to go home.  OT Frequency:      Co-evaluation              AM-PAC OT "6 Clicks" Daily Activity     Outcome Measure Help from another person eating meals?: None Help from another person taking care of personal grooming?: None Help from another person toileting, which includes using toliet, bedpan, or urinal?: None Help from another person bathing (including washing, rinsing, drying)?: None Help from another person to put on and taking off regular upper body clothing?: None Help from another person to put on and taking off regular lower body clothing?: None 6 Click Score: 24   End of Session Nurse Communication: Patient requests pain meds  Activity Tolerance: Patient tolerated treatment well Patient left: in bed;with call bell/phone within reach;with family/visitor present  OT Visit Diagnosis: Pain                Time: [id number] OT Time Calculation (min): 42 min Charges:  OT General Charges $OT Visit: 1 Visit OT Evaluation $OT Eval Moderate Complexity: 1 Mod OT Treatments $Self Care/Home Management : 8-22 mins Reece Packer OT/L  Lasandra Batley 06/13/2022, 10:41 AM

## 2022-06-14 DIAGNOSIS — L03115 Cellulitis of right lower limb: Secondary | ICD-10-CM | POA: Diagnosis not present

## 2022-06-14 LAB — BRAIN NATRIURETIC PEPTIDE: B Natriuretic Peptide: 466.5 pg/mL — ABNORMAL HIGH (ref 0.0–100.0)

## 2022-06-14 LAB — CBC WITH DIFFERENTIAL/PLATELET
Abs Immature Granulocytes: 0.11 10*3/uL — ABNORMAL HIGH (ref 0.00–0.07)
Basophils Absolute: 0.1 10*3/uL (ref 0.0–0.1)
Basophils Relative: 0 %
Eosinophils Absolute: 0.3 10*3/uL (ref 0.0–0.5)
Eosinophils Relative: 3 %
HCT: 38 % — ABNORMAL LOW (ref 39.0–52.0)
Hemoglobin: 13.2 g/dL (ref 13.0–17.0)
Immature Granulocytes: 1 %
Lymphocytes Relative: 14 %
Lymphs Abs: 1.6 10*3/uL (ref 0.7–4.0)
MCH: 30.9 pg (ref 26.0–34.0)
MCHC: 34.7 g/dL (ref 30.0–36.0)
MCV: 89 fL (ref 80.0–100.0)
Monocytes Absolute: 1.1 10*3/uL — ABNORMAL HIGH (ref 0.1–1.0)
Monocytes Relative: 9 %
Neutro Abs: 8.6 10*3/uL — ABNORMAL HIGH (ref 1.7–7.7)
Neutrophils Relative %: 73 %
Platelets: 149 10*3/uL — ABNORMAL LOW (ref 150–400)
RBC: 4.27 MIL/uL (ref 4.22–5.81)
RDW: 15.9 % — ABNORMAL HIGH (ref 11.5–15.5)
WBC: 11.8 10*3/uL — ABNORMAL HIGH (ref 4.0–10.5)
nRBC: 0 % (ref 0.0–0.2)

## 2022-06-14 LAB — GLUCOSE, CAPILLARY
Glucose-Capillary: 106 mg/dL — ABNORMAL HIGH (ref 70–99)
Glucose-Capillary: 130 mg/dL — ABNORMAL HIGH (ref 70–99)
Glucose-Capillary: 134 mg/dL — ABNORMAL HIGH (ref 70–99)
Glucose-Capillary: 108 mg/dL — ABNORMAL HIGH (ref 70–99)

## 2022-06-14 LAB — HEMOGLOBIN A1C
Hgb A1c MFr Bld: 6.2 % — ABNORMAL HIGH (ref 4.8–5.6)
Mean Plasma Glucose: 131 mg/dL

## 2022-06-14 LAB — MAGNESIUM: Magnesium: 2 mg/dL (ref 1.7–2.4)

## 2022-06-14 LAB — BASIC METABOLIC PANEL
Anion gap: 11 (ref 5–15)
BUN: 16 mg/dL (ref 8–23)
CO2: 23 mmol/L (ref 22–32)
Calcium: 8.4 mg/dL — ABNORMAL LOW (ref 8.9–10.3)
Chloride: 104 mmol/L (ref 98–111)
Creatinine, Ser: 1.07 mg/dL (ref 0.61–1.24)
GFR, Estimated: >60 mL/min (ref >60)
Glucose, Bld: 104 mg/dL — ABNORMAL HIGH (ref 70–99)
Potassium: 4.6 mmol/L (ref 3.5–5.1)
Sodium: 138 mmol/L (ref 135–145)

## 2022-06-14 LAB — PROCALCITONIN: Procalcitonin: 1.86 ng/mL

## 2022-06-14 LAB — MRSA NEXT GEN BY PCR, NASAL: MRSA by PCR Next Gen: NOT DETECTED

## 2022-06-14 LAB — C-REACTIVE PROTEIN: CRP: 16.2 mg/dL — ABNORMAL HIGH (ref <1.0)

## 2022-06-14 MED ORDER — NICOTINE 21 MG/24HR TD PT24
21.0000 mg | MEDICATED_PATCH | Freq: Every day | TRANSDERMAL | Status: DC
  Administered 2022-06-14 – 2022-06-20 (×7): 21 mg via TRANSDERMAL
  Filled 2022-06-14 (×7): qty 1

## 2022-06-14 MED ORDER — AMLODIPINE BESYLATE 10 MG PO TABS
10.0000 mg | ORAL_TABLET | Freq: Every day | ORAL | Status: DC
  Administered 2022-06-14 – 2022-06-20 (×7): 10 mg via ORAL
  Filled 2022-06-14 (×7): qty 1

## 2022-06-14 MED ORDER — HYDRALAZINE HCL 20 MG/ML IJ SOLN
10.0000 mg | Freq: Four times a day (QID) | INTRAMUSCULAR | Status: DC | PRN
  Administered 2022-06-18: 10 mg via INTRAVENOUS
  Filled 2022-06-14: qty 1

## 2022-06-14 MED ORDER — VANCOMYCIN HCL 1500 MG/300ML IV SOLN
1500.0000 mg | INTRAVENOUS | Status: DC
  Administered 2022-06-14 – 2022-06-19 (×6): 1500 mg via INTRAVENOUS
  Filled 2022-06-14 (×6): qty 300

## 2022-06-14 MED ORDER — INSULIN ASPART 100 UNIT/ML IJ SOLN: 0.0000 [IU] | Freq: Three times a day (TID) | INTRAMUSCULAR | Status: DC

## 2022-06-14 NOTE — Progress Notes (Addendum)
  Progress Note    06/14/2022 8:09 AM * No surgery found *  Subjective:  feeling alright, still having some pain in the right leg    Vitals:   06/13/22 2340 06/14/22 0443  BP: (!) 179/90 (!) 193/80  Pulse: 63 60  Resp: 17 16  Temp: 97.8 F (36.6 C) 97.9 F (36.6 C)  SpO2: 90% 92%    Physical Exam: General:  resting comfortably Cardiac:  regular Extremities:  no progression of RLE erythema. Some recession of erythema in the upper thigh. Right lower leg still markedly edematous and erythematous  CBC    Component Value Date/Time   WBC 11.8 (H) 06/14/2022 0553   RBC 4.27 06/14/2022 0553   HGB 13.2 06/14/2022 0553   HGB 13.6 12/19/2020 1110   HGB 19.2 (H) 11/25/2016 1430   HCT 38.0 (L) 06/14/2022 0553   HCT 38.9 12/19/2020 1110   HCT 54.8 (H) 11/25/2016 1430   PLT 149 (L) 06/14/2022 0553   PLT 250 12/19/2020 1110   MCV 89.0 06/14/2022 0553   MCV 99 (H) 12/19/2020 1110   MCV 92.3 11/25/2016 1430   MCH 30.9 06/14/2022 0553   MCHC 34.7 06/14/2022 0553   RDW 15.9 (H) 06/14/2022 0553   RDW 11.8 12/19/2020 1110   RDW 15.0 (H) 11/25/2016 1430   LYMPHSABS 1.6 06/14/2022 0553   LYMPHSABS 2.5 11/25/2016 1430   MONOABS 1.1 (H) 06/14/2022 0553   MONOABS 1.3 (H) 11/25/2016 1430   EOSABS 0.3 06/14/2022 0553   EOSABS 0.3 11/25/2016 1430   BASOSABS 0.1 06/14/2022 0553   BASOSABS 0.0 11/25/2016 1430    BMET    Component Value Date/Time   NA 138 06/14/2022 0553   NA 137 12/19/2020 1110   NA 140 11/25/2016 1430   K 4.6 06/14/2022 0553   K 4.3 11/25/2016 1430   CL 104 06/14/2022 0553   CO2 23 06/14/2022 0553   CO2 27 11/25/2016 1430   GLUCOSE 104 (H) 06/14/2022 0553   GLUCOSE 101 11/25/2016 1430   BUN 16 06/14/2022 0553   BUN 9 12/19/2020 1110   BUN 12.6 11/25/2016 1430   CREATININE 1.07 06/14/2022 0553   CREATININE 0.92 07/09/2019 1435   CREATININE 0.9 11/25/2016 1430   CALCIUM 8.4 (L) 06/14/2022 0553   CALCIUM 10.3 11/25/2016 1430   GFRNONAA >60 06/14/2022 0553    GFRAA >60 07/09/2018 1610    INR    Component Value Date/Time   INR 0.9 06/12/2022 1955     Intake/Output Summary (Last 24 hours) at 06/14/2022 0809 Last data filed at 06/13/2022 1846 Gross per 24 hour  Intake 1805 ml  Output 1600 ml  Net 205 ml      Assessment/Plan:  67 y.o. male with RLE cellulitis   -RLE still very erythematous and swollen, however some redness has receded in the upper thigh -Continue ABX and pain control -Reassuring that his WBC and inflammatory markers are trending down  Vicente Serene, PA-C [facility] [phone] 06/14/2022 8:09 AM   Agree with the above.  Continue IV abx.  If he does not improve, will consider CT of leg for better evaluation.  No signs of necrotizing infection at this time  Allison Quarry

## 2022-06-14 NOTE — Progress Notes (Signed)
Mobility Specialist - Progress Note   06/14/22 1422  Mobility  Activity Ambulated with assistance in hallway  Level of Assistance Standby assist, set-up cues, supervision of patient - no hands on  Assistive Device None  Distance Ambulated (ft) 230 ft  Activity Response Tolerated well  Mobility Referral Yes  $Mobility charge 1 Mobility   Pt was received in bed and agreeable to mobility. Session was limited d/t RLE pain and fatigue. Pt was returned to EOB with all needs met. Family present.   Franki Monte  Mobility Specialist Please contact via Solicitor or Rehab office at [phone]

## 2022-06-14 NOTE — Progress Notes (Signed)
Pharmacy Antibiotic Note  Joshua [name] is a 66 y.o. male admitted on 06/12/2022 with severe cellulitis.  Pharmacy has been consulted for Vancomycin and Cefepime dosing.  Creatinine has continue to improve. Cefepime regimen adjusted 3/28. Noted clinically improving but still with significant cellulitis. CRP and WBC trended down some.   Plan: Cefepime 2gm IV q8hrs. Increase Vancomycin from 1250 mg >1500 mg IV Q 24 hrs Goal AUC 400-550. Expected AUC: 458 SCr used: 1.07 Follow renal function, culture data, clinical progress and antibiotic plans. Will check vanc levels at steady state if indicated.   Height: 5\' 9"  (175.3 cm) Weight: 107.7 kg (237 lb 7 oz) IBW/kg (Calculated) : 70.7  Temp (24hrs), Avg:97.8 F (36.6 C), Min:97.1 F (36.2 C), Max:98.1 F (36.7 C)  Recent Labs  Lab 06/12/22 1345 06/12/22 1347 06/12/22 1620 06/12/22 1955 06/13/22 0422 06/14/22 0553  WBC  --  16.8*  --   --  14.7* 11.8*  CREATININE  --  1.56*  --  1.32* 1.27* 1.07  LATICACIDVEN 1.1  --  1.4  --   --   --      Estimated Creatinine Clearance: 82.1 mL/min (by C-G formula based on SCr of 1.07 mg/dL).    Allergies  Allergen Reactions   Zestril [Lisinopril] Hives and Itching   Contrast Media [Iodinated Contrast Media] Hives and Itching    OK if pt has Benadryl pre-med.   Elavil [Amitriptyline] Nausea Only and Other (See Comments)    Makes pt "feel weird"   Gadolinium Derivatives Other (See Comments)    Unknown reaction   Motrin [Ibuprofen] Other (See Comments)    Internal bleeding    Antimicrobials this admission: Ceftriaxone x 1 on 3/27 Vancomycin 3/27>> Cefepime 3/27>>  Dose adjustments this admission: 3/28: Cefepime 2gm IV q12h > q8h for improved renal function 3/28: Vanc 1250 > 1500 mg IV q24h for improved renal function  Microbiology results: 3/27 blood: no growth x 2 days to date MRSA PCR: pending  Thank you for allowing pharmacy to be a part of this patient's  care.  Arty Baumgartner, [place] 06/14/2022 12:06 PM

## 2022-06-14 NOTE — Plan of Care (Signed)

## 2022-06-14 NOTE — Progress Notes (Signed)
PROGRESS NOTE                                                                                                                                                                                                             Patient Demographics:    Joshua [name], is a 67 y.o. male, DOB - [birth year], [id number]  Outpatient Primary MD for the patient is Sandi Mariscal, MD    LOS - 2  Admit date - 06/12/2022    Chief Complaint  Patient presents with   Sent by PCP   Leg Pain       Brief Narrative (HPI from H&P)    67 y.o. male with medical history significant of PAD, Status post right femoral to above-the-knee popliteal bypass with vein by Dr. Trula Slade done in January 2023, right common femoral endarterectomy by Dr. Trula Slade done in October 2022, COPD, DM2, GERD, gout, HLD, HTN. Afib not on anticoagulation due to hx of GI bleed who presented to the hospital with 3-day history of right lower extremity redness, was seen in vascular surgery office where he was diagnosed with severe right lower extremity cellulitis and admitted to the hospital.   Subjective:   Patient in bed denies any fever or chills, no headache chest or abdominal pain, no shortness of breath, right lower extremity discomfort improving.   Assessment  & Plan :    Severe right lower extremity cellulitis.  Thankfully his ABIs and lower extremity venous duplex are nonacute, seen by vascular surgery Case discussed with vascular surgery on 06/13/2022, he is on empiric IV antibiotics after which she already feels better, continue to monitor cultures and inflammatory markers, MRSA nasal PCR pending.  Clinically improving will continue to monitor.  Appreciate VVS follow-up.  PAD s/p right femoropopliteal breast bypass and right common femoral endarterectomy both over 1 year ago.  No acute issues on exam, continue aspirin and statin for secondary prevention, vascular surgery  following.  ABIs noted along with lower extremity venous duplex.  Both appear nonacute.  CAD in native artery, occluded Lcx and rotational atherectomy to LAD with DES 05/17/15 - no acute issues, chest pain-free, continue aspirin, Crestor 40 mg daily and Zetia 10 mg a day continue Coreg 12.5 mg twice daily.   History of paroxysmal atrial fibrillation.  Mali vas 2 score of greater than 3.  Continue  Coreg, not on anticoagulation due to history of GI bleed.  Hypertension.  Coreg, poor control added Norvasc scheduled along with as needed hydralazine.  COPD.  No acute issues.  Supportive care.  Dyslipidemia.  On statin along with Zetia.  GERD and history of GI bleed.  On PPI.  Hypokalemia, hyponatremia and AKI.  Improving with hydration, electrolytes replaced.  Smoking.  Counseled to quit.  He refused nicotine patch.    DM type II.  Hold p.o. medications.  Sliding scale.  Lab Results  Component Value Date   HGBA1C 6.2 (H) 06/12/2022   CBG (last 3)  Recent Labs    06/13/22 1543 06/13/22 2124 06/14/22 0505  GLUCAP 108* 97 106*        Condition - Fair  Family Communication  : Present  Code Status : Full code  Consults  : Vascular surgery  PUD Prophylaxis : PPI   Procedures  :     Bilateral lower extremity venous duplex.  No DVT.  Bilateral lower extremity ABI.  Normal on the right side, severe PAD on the left.      Disposition Plan  :    Status is: Inpatient  DVT Prophylaxis  :  Heparin  SCDs Start: 06/12/22 2154   Lab Results  Component Value Date   PLT 149 (L) 06/14/2022    Diet :  Diet Order             Diet heart healthy/carb modified Room service appropriate? Yes; Fluid consistency: Thin  Diet effective now                    Inpatient Medications  Scheduled Meds:  amLODipine  10 mg Oral Daily   aspirin EC  81 mg Oral Daily   carvedilol  12.5 mg Oral BID   colchicine  0.6 mg Oral Daily   ezetimibe  10 mg Oral QPM   fluticasone  furoate-vilanterol  1 puff Inhalation Daily   And   umeclidinium bromide  1 puff Inhalation Daily   insulin aspart  0-9 Units Subcutaneous TID PC & HS   multivitamin with minerals  1 tablet Oral Daily   pantoprazole  40 mg Oral Daily   pregabalin  100 mg Oral TID   Ensure Max Protein  11 oz Oral Daily   rosuvastatin  40 mg Oral QPM   traZODone  100 mg Oral QHS   Continuous Infusions:  ceFEPime (MAXIPIME) IV 2 g (06/14/22 0154)   methocarbamol (ROBAXIN) IV 500 mg (06/14/22 0050)   vancomycin 1,250 mg (06/13/22 1852)   PRN Meds:.acetaminophen **OR** acetaminophen, albuterol, fentaNYL (SUBLIMAZE) injection, hydrALAZINE, HYDROcodone-acetaminophen, methocarbamol (ROBAXIN) IV, ondansetron **OR** ondansetron (ZOFRAN) IV, oxyCODONE-acetaminophen    Objective:   Vitals:   06/13/22 1800 06/13/22 2030 06/13/22 2340 06/14/22 0443  BP:  (!) 196/80 (!) 179/90 (!) 193/80  Pulse:  (!) 50 63 60  Resp:  15 17 16   Temp:  (!) 97.1 F (36.2 C) 97.8 F (36.6 C) 97.9 F (36.6 C)  TempSrc:  Oral Oral Oral  SpO2: 92% 97% 90% 92%  Weight:      Height:        Wt Readings from Last 3 Encounters:  06/12/22 107.7 kg  06/12/22 107 kg  12/13/21 111.3 kg     Intake/Output Summary (Last 24 hours) at 06/14/2022 0818 Last data filed at 06/13/2022 1846 Gross per 24 hour  Intake 1805 ml  Output 1600 ml  Net 205 ml  Physical Exam  Awake Alert, No new F.N deficits, Normal affect Rachel.AT,PERRAL Supple Neck, No JVD,   Symmetrical Chest wall movement, Good air movement bilaterally, CTAB RRR,No Gallops,Rubs or new Murmurs,  +ve B.Sounds, Abd Soft, No tenderness,   R leg thigh to toes red and warm, gradually improving as compared to the time of admission  Picture from 06/12/22      Data Review:    Recent Labs  Lab 06/12/22 1347 06/13/22 0422 06/14/22 0553  WBC 16.8* 14.7* 11.8*  HGB 14.2 13.0 13.2  HCT 41.8 38.5* 38.0*  PLT 158 150 149*  MCV 89.3 89.7 89.0  MCH 30.3 30.3 30.9  MCHC  34.0 33.8 34.7  RDW 15.8* 15.9* 15.9*  LYMPHSABS 1.2  --  1.6  MONOABS 1.2*  --  1.1*  EOSABS 0.1  --  0.3  BASOSABS 0.0  --  0.1    Recent Labs  Lab 06/12/22 1345 06/12/22 1347 06/12/22 1620 06/12/22 1951 06/12/22 1955 06/12/22 2327 06/13/22 0422 06/14/22 0553  NA  --  132*  --   --  135  --  137 138  K  --  3.9  --   --  3.4*  --  3.8 4.6  CL  --  96*  --   --  97*  --  102 104  CO2  --  25  --   --  25  --  25 23  ANIONGAP  --  11  --   --  13  --  10 11  GLUCOSE  --  127*  --   --  93  --  99 104*  BUN  --  29*  --   --  24*  --  22 16  CREATININE  --  1.56*  --   --  1.32*  --  1.27* 1.07  AST  --  21  --   --   --   --  18  --   ALT  --  11  --   --   --   --  12  --   ALKPHOS  --  137*  --   --   --   --  129*  --   BILITOT  --  0.8  --   --   --   --  0.7  --   ALBUMIN  --  2.3*  --   --   --   --  2.0*  --   CRP  --   --   --   --   --   --  21.1* 16.2*  PROCALCITON  --   --   --   --   --   --  3.33  --   LATICACIDVEN 1.1  --  1.4  --   --   --   --   --   INR  --   --   --   --  0.9  --   --   --   TSH  --   --   --  1.901  --   --   --   --   HGBA1C  --   --   --   --   --  6.2*  --   --   BNP  --   --   --   --   --   --  347.9* 466.5*  MG  --   --   --   --  1.8  --  2.0 2.0  CALCIUM  --  8.3*  --   --  7.8*  --  8.1* 8.4*    Lab Results  Component Value Date   HGBA1C 6.2 (H) 06/12/2022   Recent Labs    06/12/22 1951  TSH 1.901   Micro Results Recent Results (from the past 240 hour(s))  Blood culture (routine x 2)     Status: None (Preliminary result)   Collection Time: 06/12/22  4:20 PM   Specimen: BLOOD  Result Value Ref Range Status   Specimen Description BLOOD RIGHT ANTECUBITAL  Final   Special Requests   Final    BOTTLES DRAWN AEROBIC AND ANAEROBIC Blood Culture adequate volume   Culture   Final    NO GROWTH 2 DAYS Performed at [facility], [place] [address], [place], [place] [address]    Report Status PENDING  Incomplete  Blood  culture (routine x 2)     Status: None (Preliminary result)   Collection Time: 06/12/22  4:40 PM   Specimen: BLOOD  Result Value Ref Range Status   Specimen Description BLOOD LEFT ANTECUBITAL  Final   Special Requests   Final    BOTTLES DRAWN AEROBIC AND ANAEROBIC Blood Culture adequate volume   Culture   Final    NO GROWTH 2 DAYS Performed at [facility], [place] [address] [place], [place] [address]    Report Status PENDING  Incomplete    Radiology Reports VAS [place] LOWER EXTREMITY ARTERIAL DUPLEX  Result Date: 06/13/2022 LOWER EXTREMITY ARTERIAL DUPLEX STUDY Patient Name:  EAMES CERROS Zingale  Date of Exam:   06/12/2022 Medical Rec #: [id number]           Accession #:    [id number] Date of Birth: 07-Aug-1955           Patient Gender: M Patient Age:   27 years Exam Location:  [facility] Procedure:      VAS [place] LOWER EXTREMITY ARTERIAL DUPLEX Referring Phys: Harold Barban --------------------------------------------------------------------------------  Indications: New sudden pain, redness and heat of the right leg.  Vascular Interventions: 04/04/2021                         Pre-operative Diagnosis: Severe right leg claudication                         Post-operative diagnosis: Same                         Surgeon: [name]                         Assistants: Laurence Slate                         Procedure: #1: Right femoral to above-knee popliteal                         bypass graft with 9 reversed ipsilateral saphenous vein                         #2: Redo right femoral artery exposure. Current ABI:            R=0.96, L=0.43 Performing Technologist: Ronal Fear RVS, RCS  Examination Guidelines: A complete evaluation includes B-mode  imaging, spectral Doppler, color Doppler, and power Doppler as needed of all accessible portions of each vessel. Bilateral testing is considered an integral part of a complete examination. Limited examinations for reoccurring indications may be  performed as noted.   Right Graft #1: femoropopliteal +------------------+--------+--------+----------+--------+                   PSV cm/sStenosisWaveform  Comments +------------------+--------+--------+----------+--------+ Inflow            65              biphasic           +------------------+--------+--------+----------+--------+ Prox Anastomosis  60              monophasic         +------------------+--------+--------+----------+--------+ Proximal Graft    57              monophasic         +------------------+--------+--------+----------+--------+ Mid Graft         124             biphasic           +------------------+--------+--------+----------+--------+ Distal Graft      92              monophasic         +------------------+--------+--------+----------+--------+ Distal Anastomosis101             monophasic         +------------------+--------+--------+----------+--------+ Outflow           126             monophasic         +------------------+--------+--------+----------+--------+   +----------+--------+-----+---------------+----------+--------+ LEFT      PSV cm/sRatioStenosis       Waveform  Comments +----------+--------+-----+---------------+----------+--------+ CFA Distal415          75-99% stenosismonophasic         +----------+--------+-----+---------------+----------+--------+ DFA       127                                            +----------+--------+-----+---------------+----------+--------+ SFA Prox  288          50-74% stenosismonophasic         +----------+--------+-----+---------------+----------+--------+ SFA Mid   26                          monophasic         +----------+--------+-----+---------------+----------+--------+ SFA Distal39                          monophasic         +----------+--------+-----+---------------+----------+--------+ POP Prox  27                          monophasic          +----------+--------+-----+---------------+----------+--------+ POP Distal23                          monophasic         +----------+--------+-----+---------------+----------+--------+ ATA Distal24                          monophasic         +----------+--------+-----+---------------+----------+--------+ PTA Distal33  monophasic         +----------+--------+-----+---------------+----------+--------+  Summary: Right: Widely patent bypass without stenosis. No evidence of DVT with limited scan. Left: 75-99% stenosis noted in the common femoral artery. 50-74% stenosis noted in the superficial femoral artery.  See table(s) above for measurements and observations. Electronically signed by Deitra Mayo MD on 06/13/2022 at 8:33:09 AM.    Final    VAS [place] ABI WITH/WO TBI  Result Date: 06/13/2022  LOWER EXTREMITY DOPPLER STUDY Patient Name:  IMANOL FETCHKO  Date of Exam:   06/12/2022 Medical Rec #: [id number]           Accession #:    [id number] Date of Birth: 1956/01/26           Patient Gender: M Patient Age:   30 years Exam Location:  [facility] Procedure:      VAS [place] ABI WITH/WO TBI Referring Phys: --------------------------------------------------------------------------------  Indications: Patient called triage complaining of extreme right lower extremity              pain, redness and swelling 3 days. Patient was scheduled for his 6              month follow up studies.  Vascular Interventions: 04/04/2021                         Pre-operative Diagnosis: Severe right leg claudication                         Post-operative diagnosis: Same                         Surgeon: [name]                         Assistants: Laurence Slate                         Procedure: #1: Right femoral to above-knee popliteal                         bypass graft with 9 reversed ipsilateral saphenous vein                         #2: Redo right femoral artery  exposure. Performing Technologist: Ronal Fear RVS, RCS  Examination Guidelines: A complete evaluation includes at minimum, Doppler waveform signals and systolic blood pressure reading at the level of bilateral brachial, anterior tibial, and posterior tibial arteries, when vessel segments are accessible. Bilateral testing is considered an integral part of a complete examination. Photoelectric Plethysmograph (PPG) waveforms and toe systolic pressure readings are included as required and additional duplex testing as needed. Limited examinations for reoccurring indications may be performed as noted.  ABI Findings: +---------+------------------+-----+----------+--------+ Right    Rt Pressure (mmHg)IndexWaveform  Comment  +---------+------------------+-----+----------+--------+ Brachial 163                                       +---------+------------------+-----+----------+--------+ PTA      150               0.91 monophasic         +---------+------------------+-----+----------+--------+ DP       157  0.96 biphasic           +---------+------------------+-----+----------+--------+ Great Toe122               0.74                    +---------+------------------+-----+----------+--------+ +---------+------------------+-----+----------+-------+ Left     Lt Pressure (mmHg)IndexWaveform  Comment +---------+------------------+-----+----------+-------+ Brachial 164                                      +---------+------------------+-----+----------+-------+ PTA      70                0.43 monophasic        +---------+------------------+-----+----------+-------+ DP       71                0.43 monophasic        +---------+------------------+-----+----------+-------+ Great Toe65                0.40                   +---------+------------------+-----+----------+-------+ +-------+-----------+-----------+------------+------------+ ABI/TBIToday's  ABIToday's TBIPrevious ABIPrevious TBI +-------+-----------+-----------+------------+------------+ Right  0.96       0.74       1.03        0.63         +-------+-----------+-----------+------------+------------+ Left   0.43       0.40       0.60        0.53         +-------+-----------+-----------+------------+------------+  Right ABIs appear essentially unchanged. Left ABIs appear decreased compared to prior study on 12/13/2021.  Summary: Right: Resting right ankle-brachial index is within normal range. The right toe-brachial index is normal. Left: Resting left ankle-brachial index indicates severe left lower extremity arterial disease. The left toe-brachial index is abnormal. *See table(s) above for measurements and observations.  Electronically signed by Deitra Mayo MD on 06/13/2022 at 8:28:59 AM.    Final    DG CHEST PORT 1 VIEW  Result Date: 06/12/2022 CLINICAL DATA:  Hyponatremia EXAM: PORTABLE CHEST 1 VIEW COMPARISON:  Previous studies including the examination of 11/14/2020 FINDINGS: Cardiac size is within normal limits. There are no signs of pulmonary edema. There is linear bandlike density in the right parahilar region with no significant change suggesting possible scarring from previous intervention. There are no new infiltrates. There is no pleural effusion or pneumothorax. There is surgical fusion in cervical spine. IMPRESSION: Linear density in right parahilar region has not changed significantly suggesting scarring from previous intervention. There are no new infiltrates or signs of pulmonary edema. There is no pleural effusion or pneumothorax. Electronically Signed   By: Elmer Picker M.D.   On: 06/12/2022 20:19   DG Femur Min 2 Views Right  Result Date: 06/12/2022 CLINICAL DATA:  Right leg pain for several days, no known injury, initial encounter EXAM: RIGHT FEMUR 2 VIEWS COMPARISON:  None Available. FINDINGS: Degenerative changes of the right hip joint are  noted. Diffuse vascular calcifications are seen. No acute fracture or dislocation is noted. No soft tissue changes are seen. IMPRESSION: No acute abnormality noted. Electronically Signed   By: Inez Catalina M.D.   On: 06/12/2022 19:11   DG Tibia/Fibula Right  Result Date: 06/12/2022 CLINICAL DATA:  Right leg pain, swelling, and redness. Chills, weight loss and pain. EXAM: RIGHT TIBIA AND FIBULA - 2 VIEW COMPARISON:  11/15/2020 FINDINGS: Diffuse soft tissue swelling and edema throughout the visualized right lower extremity. No radiopaque soft tissue foreign bodies or soft tissue gas collections. Surgical clips medial to the right knee. Vascular calcifications. Bones appear intact. No evidence of acute fracture or dislocation of the right tibia or fibula. No focal bone lesion or bone destruction. IMPRESSION: Diffuse soft tissue swelling.  No acute bony abnormalities. Electronically Signed   By: Lucienne Capers M.D.   On: 06/12/2022 19:05   VAS [place] LOWER EXTREMITY VENOUS (DVT) (ONLY [facility] & [facility])  Result Date: 06/12/2022  Lower Venous DVT Study Patient Name:  GEOVANIE UMBAUGH  Date of Exam:   06/12/2022 Medical Rec #: [id number]           Accession #:    [id number] Date of Birth: 1955-09-24           Patient Gender: M Patient Age:   37 years Exam Location:  [facility] Procedure:      VAS [place] LOWER EXTREMITY VENOUS (DVT) Referring Phys: Herbie Baltimore PATERSON --------------------------------------------------------------------------------  Indications: Edema, and Pain.  Comparison Study: previous study 12/17/16 negative. Performing Technologist: McKayla Maag RVT, VT  Examination Guidelines: A complete evaluation includes B-mode imaging, spectral Doppler, color Doppler, and power Doppler as needed of all accessible portions of each vessel. Bilateral testing is considered an integral part of a complete examination. Limited examinations for reoccurring indications may be performed as noted. The reflux portion of the exam  is performed with the patient in reverse Trendelenburg.  +--------+---------------+---------+-----------+----------+--------------------+ RIGHT   CompressibilityPhasicitySpontaneityPropertiesThrombus Aging       +--------+---------------+---------+-----------+----------+--------------------+ CFV     Full                                                              +--------+---------------+---------+-----------+----------+--------------------+ SFJ     Full                                                              +--------+---------------+---------+-----------+----------+--------------------+ FV Prox Full                                                              +--------+---------------+---------+-----------+----------+--------------------+ FV Mid  Full                                                              +--------+---------------+---------+-----------+----------+--------------------+ FV                                                   patent by color  Distal                                               doppler              +--------+---------------+---------+-----------+----------+--------------------+ PFV     Full                                                              +--------+---------------+---------+-----------+----------+--------------------+ POP     Full                                                              +--------+---------------+---------+-----------+----------+--------------------+ PTV     Full                                         Not well visualized  +--------+---------------+---------+-----------+----------+--------------------+ PERO    Full           Yes      Yes                  Not well visualized. +--------+---------------+---------+-----------+----------+--------------------+   +----+---------------+---------+-----------+----------+--------------+  LEFTCompressibilityPhasicitySpontaneityPropertiesThrombus Aging +----+---------------+---------+-----------+----------+--------------+ CFV Full           Yes      Yes                                 +----+---------------+---------+-----------+----------+--------------+ SFJ Full                                                        +----+---------------+---------+-----------+----------+--------------+     Summary: RIGHT: - There is no evidence of deep vein thrombosis in the lower extremity. However, portions of this examination were limited- see technologist comments above.  - No cystic structure found in the popliteal fossa. - Ultrasound characteristics of enlarged lymph nodes are noted in the groin.  LEFT: - No evidence of common femoral vein obstruction.  *See table(s) above for measurements and observations. Electronically signed by Deitra Mayo MD on 06/12/2022 at 7:05:14 PM.    Final       Signature  -   Lala Lund M.D on 06/14/2022 at 8:18 AM   -  To page go to [email or web address]

## 2022-06-15 DIAGNOSIS — L03115 Cellulitis of right lower limb: Secondary | ICD-10-CM | POA: Diagnosis not present

## 2022-06-15 LAB — CBC WITH DIFFERENTIAL/PLATELET
Abs Immature Granulocytes: 0.12 10*3/uL — ABNORMAL HIGH (ref 0.00–0.07)
Basophils Absolute: 0.1 10*3/uL (ref 0.0–0.1)
Basophils Relative: 1 %
Eosinophils Absolute: 0.3 10*3/uL (ref 0.0–0.5)
Eosinophils Relative: 3 %
HCT: 39.9 % (ref 39.0–52.0)
Hemoglobin: 13.9 g/dL (ref 13.0–17.0)
Immature Granulocytes: 1 %
Lymphocytes Relative: 14 %
Lymphs Abs: 1.4 10*3/uL (ref 0.7–4.0)
MCH: 30.1 pg (ref 26.0–34.0)
MCHC: 34.8 g/dL (ref 30.0–36.0)
MCV: 86.4 fL (ref 80.0–100.0)
Monocytes Absolute: 0.9 10*3/uL (ref 0.1–1.0)
Monocytes Relative: 8 %
Neutro Abs: 7.7 10*3/uL (ref 1.7–7.7)
Neutrophils Relative %: 73 %
Platelets: 215 10*3/uL (ref 150–400)
RBC: 4.62 MIL/uL (ref 4.22–5.81)
RDW: 15.4 % (ref 11.5–15.5)
WBC: 10.5 10*3/uL (ref 4.0–10.5)
nRBC: 0 % (ref 0.0–0.2)

## 2022-06-15 LAB — BASIC METABOLIC PANEL
Anion gap: 14 (ref 5–15)
BUN: 11 mg/dL (ref 8–23)
CO2: 22 mmol/L (ref 22–32)
Calcium: 8.6 mg/dL — ABNORMAL LOW (ref 8.9–10.3)
Chloride: 100 mmol/L (ref 98–111)
Creatinine, Ser: 1.03 mg/dL (ref 0.61–1.24)
GFR, Estimated: >60 mL/min (ref >60)
Glucose, Bld: 122 mg/dL — ABNORMAL HIGH (ref 70–99)
Potassium: 3.3 mmol/L — ABNORMAL LOW (ref 3.5–5.1)
Sodium: 136 mmol/L (ref 135–145)

## 2022-06-15 LAB — C-REACTIVE PROTEIN: CRP: 8.5 mg/dL — ABNORMAL HIGH (ref <1.0)

## 2022-06-15 LAB — BRAIN NATRIURETIC PEPTIDE: B Natriuretic Peptide: 308.9 pg/mL — ABNORMAL HIGH (ref 0.0–100.0)

## 2022-06-15 LAB — PROCALCITONIN: Procalcitonin: 1.01 ng/mL

## 2022-06-15 LAB — MAGNESIUM: Magnesium: 1.7 mg/dL (ref 1.7–2.4)

## 2022-06-15 MED ORDER — MAGNESIUM SULFATE IN D5W 1-5 GM/100ML-% IV SOLN
1.0000 g | Freq: Once | INTRAVENOUS | Status: AC
  Administered 2022-06-15: 1 g via INTRAVENOUS
  Filled 2022-06-15: qty 100

## 2022-06-15 MED ORDER — POTASSIUM CHLORIDE CRYS ER 20 MEQ PO TBCR
40.0000 meq | EXTENDED_RELEASE_TABLET | Freq: Once | ORAL | Status: AC
  Administered 2022-06-15: 40 meq via ORAL

## 2022-06-15 NOTE — Progress Notes (Addendum)
  Progress Note    06/15/2022 9:40 AM * No surgery found *  Subjective: Right leg feels much better today compared to yesterday   Vitals:   06/15/22 0750 06/15/22 0807  BP: 109/66   Pulse: 65   Resp: 16   Temp: 98.6 F (37 C)   SpO2: 98% 99%   Physical Exam: Lungs: Nonlabored Extremities: Right foot warm and well-perfused; cellulitis receding in the right thigh Neurologic: A&O  CBC    Component Value Date/Time   WBC 11.8 (H) 06/14/2022 0553   RBC 4.27 06/14/2022 0553   HGB 13.2 06/14/2022 0553   HGB 13.6 12/19/2020 1110   HGB 19.2 (H) 11/25/2016 1430   HCT 38.0 (L) 06/14/2022 0553   HCT 38.9 12/19/2020 1110   HCT 54.8 (H) 11/25/2016 1430   PLT 149 (L) 06/14/2022 0553   PLT 250 12/19/2020 1110   MCV 89.0 06/14/2022 0553   MCV 99 (H) 12/19/2020 1110   MCV 92.3 11/25/2016 1430   MCH 30.9 06/14/2022 0553   MCHC 34.7 06/14/2022 0553   RDW 15.9 (H) 06/14/2022 0553   RDW 11.8 12/19/2020 1110   RDW 15.0 (H) 11/25/2016 1430   LYMPHSABS 1.6 06/14/2022 0553   LYMPHSABS 2.5 11/25/2016 1430   MONOABS 1.1 (H) 06/14/2022 0553   MONOABS 1.3 (H) 11/25/2016 1430   EOSABS 0.3 06/14/2022 0553   EOSABS 0.3 11/25/2016 1430   BASOSABS 0.1 06/14/2022 0553   BASOSABS 0.0 11/25/2016 1430    BMET    Component Value Date/Time   NA 138 06/14/2022 0553   NA 137 12/19/2020 1110   NA 140 11/25/2016 1430   K 4.6 06/14/2022 0553   K 4.3 11/25/2016 1430   CL 104 06/14/2022 0553   CO2 23 06/14/2022 0553   CO2 27 11/25/2016 1430   GLUCOSE 104 (H) 06/14/2022 0553   GLUCOSE 101 11/25/2016 1430   BUN 16 06/14/2022 0553   BUN 9 12/19/2020 1110   BUN 12.6 11/25/2016 1430   CREATININE 1.07 06/14/2022 0553   CREATININE 0.92 07/09/2019 1435   CREATININE 0.9 11/25/2016 1430   CALCIUM 8.4 (L) 06/14/2022 0553   CALCIUM 10.3 11/25/2016 1430   GFRNONAA >60 06/14/2022 0553   GFRAA >60 07/09/2018 1610    INR    Component Value Date/Time   INR 0.9 06/12/2022 1955     Intake/Output  Summary (Last 24 hours) at 06/15/2022 0940 Last data filed at 06/15/2022 0518 Gross per 24 hour  Intake 755.46 ml  Output 1225 ml  Net -469.54 ml     Assessment/Plan:  67 y.o. male admitted due to cellulitis of the right lower extremity  Right leg is warm and well-perfused.  Subjectively, the leg feels much better compared to yesterday.  Cellulitis in the thigh continues to receded.  He is able to walk and bear weight without pain like he had prior to admission.  Lab work is pending.  I think we can hold off on the CT of the leg due to symptom improvement.  Will defer discharge timing and ongoing antibiotic course to the primary team    Dagoberto Ligas, PA-C [facility] [phone] 06/15/2022 9:40 AM  I agree with the above.  Have seen and evaluated the patient.  His cellulitis is dramatically better today.  Discharge planning per primary team.  He is interested in having his left leg evaluated.  I will arrange follow-up in our office to discuss left leg revascularization in 3 months.  [name]

## 2022-06-15 NOTE — Progress Notes (Signed)
PROGRESS NOTE                                                                                                                                                                                                             Patient Demographics:    Joshua [name], is a 67 y.o. male, DOB - 02-16-1956, [id number]  Outpatient Primary MD for the patient is Sandi Mariscal, MD    LOS - 3  Admit date - 06/12/2022    Chief Complaint  Patient presents with   Sent by PCP   Leg Pain       Brief Narrative (HPI from H&P)    67 y.o. male with medical history significant of PAD, Status post right femoral to above-the-knee popliteal bypass with vein by Dr. Trula Slade done in January 2023, right common femoral endarterectomy by Dr. Trula Slade done in October 2022, COPD, DM2, GERD, gout, HLD, HTN. Afib not on anticoagulation due to hx of GI bleed who presented to the hospital with 3-day history of right lower extremity redness, was seen in vascular surgery office where he was diagnosed with severe right lower extremity cellulitis and admitted to the hospital.   Subjective:   Patient in bed, appears comfortable, denies any headache, no fever, no chest pain or pressure, no shortness of breath , no abdominal pain. No focal weakness.  Right leg discomfort much improved.   Assessment  & Plan :    Severe right lower extremity cellulitis.  Thankfully his ABIs and lower extremity venous duplex are nonacute, seen by vascular surgery Case discussed with vascular surgery on 06/13/2022, he is on empiric IV antibiotics after which she already feels better, continue to monitor cultures and inflammatory markers, MRSA nasal PCR pending.  Clinically improving will continue to monitor.  Appreciate VVS follow-up.  PAD s/p right femoropopliteal breast bypass and right common femoral endarterectomy both over 1 year ago.  No acute issues on exam, continue aspirin and statin for  secondary prevention, vascular surgery following.  ABIs noted along with lower extremity venous duplex.  Both appear nonacute.  CAD in native artery, occluded Lcx and rotational atherectomy to LAD with DES 05/17/15 - no acute issues, chest pain-free, continue aspirin, Crestor 40 mg daily and Zetia 10 mg a day continue Coreg 12.5 mg twice daily.   History of paroxysmal atrial fibrillation.  Mali  vas 2 score of greater than 3.  Continue Coreg, not on anticoagulation due to history of GI bleed.  Hypertension.  Coreg, poor control added Norvasc scheduled along with as needed hydralazine.  COPD.  No acute issues.  Supportive care.  Dyslipidemia.  On statin along with Zetia.  GERD and history of GI bleed.  On PPI.  Hypokalemia, hyponatremia and AKI.  Improving with hydration, electrolytes replaced.  Smoking.  Counseled to quit.  NicoDerm patch applied.  DM type II.  Hold p.o. medications.  Sliding scale.  Lab Results  Component Value Date   HGBA1C 6.2 (H) 06/12/2022   CBG (last 3)  Recent Labs    06/14/22 0838 06/14/22 1139 06/14/22 1607  GLUCAP 130* 134* 108*        Condition - Fair  Family Communication  : Present  Code Status : Full code  Consults  : Vascular surgery  PUD Prophylaxis : PPI   Procedures  :     Bilateral lower extremity venous duplex.  No DVT.  Bilateral lower extremity ABI.  Normal on the right side, severe PAD on the left.      Disposition Plan  :    Status is: Inpatient  DVT Prophylaxis  :  Heparin  SCDs Start: 06/12/22 2154   Lab Results  Component Value Date   PLT 215 06/15/2022    Diet :  Diet Order             Diet heart healthy/carb modified Room service appropriate? Yes; Fluid consistency: Thin  Diet effective now                    Inpatient Medications  Scheduled Meds:  amLODipine  10 mg Oral Daily   aspirin EC  81 mg Oral Daily   carvedilol  12.5 mg Oral BID   colchicine  0.6 mg Oral Daily   ezetimibe  10 mg  Oral QPM   fluticasone furoate-vilanterol  1 puff Inhalation Daily   And   umeclidinium bromide  1 puff Inhalation Daily   multivitamin with minerals  1 tablet Oral Daily   nicotine  21 mg Transdermal Daily   pantoprazole  40 mg Oral Daily   potassium chloride  40 mEq Oral Once   pregabalin  100 mg Oral TID   Ensure Max Protein  11 oz Oral Daily   rosuvastatin  40 mg Oral QPM   traZODone  100 mg Oral QHS   Continuous Infusions:  ceFEPime (MAXIPIME) IV 2 g (06/15/22 0831)   methocarbamol (ROBAXIN) IV Stopped (06/14/22 0121)   vancomycin Stopped (06/14/22 2018)   PRN Meds:.acetaminophen **OR** acetaminophen, albuterol, fentaNYL (SUBLIMAZE) injection, hydrALAZINE, HYDROcodone-acetaminophen, methocarbamol (ROBAXIN) IV, ondansetron **OR** ondansetron (ZOFRAN) IV, oxyCODONE-acetaminophen    Objective:   Vitals:   06/14/22 1609 06/14/22 2351 06/15/22 0750 06/15/22 0807  BP: (!) 167/77 (!) 188/85 109/66   Pulse: (!) 51 60 65   Resp: 18 18 16    Temp: 98.2 F (36.8 C) 98.4 F (36.9 C) 98.6 F (37 C)   TempSrc: Oral Oral Oral   SpO2: 98% 98% 98% 99%  Weight:      Height:        Wt Readings from Last 3 Encounters:  06/12/22 107.7 kg  06/12/22 107 kg  12/13/21 111.3 kg     Intake/Output Summary (Last 24 hours) at 06/15/2022 1018 Last data filed at 06/15/2022 0518 Gross per 24 hour  Intake 755.46 ml  Output 1225 ml  Net -  469.54 ml     Physical Exam  Awake Alert, No new F.N deficits, Normal affect [place].AT,PERRAL Supple Neck, No JVD,   Symmetrical Chest wall movement, Good air movement bilaterally, CTAB RRR,No Gallops,Rubs or new Murmurs,  +ve B.Sounds, Abd Soft, No tenderness,   R leg thigh to toes red and warm, nurse has improved but still has considerable swelling/edema  Picture from 06/12/22      Data Review:    Recent Labs  Lab 06/12/22 1347 06/13/22 0422 06/14/22 0553 06/15/22 0843  WBC 16.8* 14.7* 11.8* 10.5  HGB 14.2 13.0 13.2 13.9  HCT 41.8 38.5*  38.0* 39.9  PLT 158 150 149* 215  MCV 89.3 89.7 89.0 86.4  MCH 30.3 30.3 30.9 30.1  MCHC 34.0 33.8 34.7 34.8  RDW 15.8* 15.9* 15.9* 15.4  LYMPHSABS 1.2  --  1.6 1.4  MONOABS 1.2*  --  1.1* 0.9  EOSABS 0.1  --  0.3 0.3  BASOSABS 0.0  --  0.1 0.1    Recent Labs  Lab 06/12/22 1345 06/12/22 1347 06/12/22 1620 06/12/22 1951 06/12/22 1955 06/12/22 2327 06/13/22 0422 06/14/22 0553 06/15/22 0843  NA  --  132*  --   --  135  --  137 138 136  K  --  3.9  --   --  3.4*  --  3.8 4.6 3.3*  CL  --  96*  --   --  97*  --  102 104 100  CO2  --  25  --   --  25  --  25 23 22   ANIONGAP  --  11  --   --  13  --  10 11 14   GLUCOSE  --  127*  --   --  93  --  99 104* 122*  BUN  --  29*  --   --  24*  --  22 16 11   CREATININE  --  1.56*  --   --  1.32*  --  1.27* 1.07 1.03  AST  --  21  --   --   --   --  18  --   --   ALT  --  11  --   --   --   --  12  --   --   ALKPHOS  --  137*  --   --   --   --  129*  --   --   BILITOT  --  0.8  --   --   --   --  0.7  --   --   ALBUMIN  --  2.3*  --   --   --   --  2.0*  --   --   CRP  --   --   --   --   --   --  21.1* 16.2*  --   PROCALCITON  --   --   --   --   --   --  3.33 1.86  --   LATICACIDVEN 1.1  --  1.4  --   --   --   --   --   --   INR  --   --   --   --  0.9  --   --   --   --   TSH  --   --   --  1.901  --   --   --   --   --  HGBA1C  --   --   --   --   --  6.2*  --   --   --   BNP  --   --   --   --   --   --  347.9* 466.5* 308.9*  MG  --   --   --   --  1.8  --  2.0 2.0 1.7  CALCIUM  --  8.3*  --   --  7.8*  --  8.1* 8.4* 8.6*    Lab Results  Component Value Date   HGBA1C 6.2 (H) 06/12/2022   Recent Labs    06/12/22 1951  TSH 1.901   Micro Results Recent Results (from the past 240 hour(s))  Blood culture (routine x 2)     Status: None (Preliminary result)   Collection Time: 06/12/22  4:20 PM   Specimen: BLOOD  Result Value Ref Range Status   Specimen Description BLOOD RIGHT ANTECUBITAL  Final   Special Requests    Final    BOTTLES DRAWN AEROBIC AND ANAEROBIC Blood Culture adequate volume   Culture   Final    NO GROWTH 3 DAYS Performed at [facility], [place] [address] [place], [place] [address]    Report Status PENDING  Incomplete  Blood culture (routine x 2)     Status: None (Preliminary result)   Collection Time: 06/12/22  4:40 PM   Specimen: BLOOD  Result Value Ref Range Status   Specimen Description BLOOD LEFT ANTECUBITAL  Final   Special Requests   Final    BOTTLES DRAWN AEROBIC AND ANAEROBIC Blood Culture adequate volume   Culture   Final    NO GROWTH 3 DAYS Performed at [facility], [place] [address], [place], [place] [address]    Report Status PENDING  Incomplete  MRSA Next Gen by PCR, Nasal     Status: None   Collection Time: 06/14/22  4:28 PM   Specimen: Nasal Mucosa; Nasal Swab  Result Value Ref Range Status   MRSA by PCR Next Gen NOT DETECTED NOT DETECTED Final    Comment: (NOTE) The GeneXpert MRSA Assay (FDA approved for NASAL specimens only), is one component of a comprehensive MRSA colonization surveillance program. It is not intended to diagnose MRSA infection nor to guide or monitor treatment for MRSA infections. Test performance is not FDA approved in patients less than 65 years old. Performed at [facility], [place] [address] [place], [place] [address]     Radiology Reports VAS [place] LOWER EXTREMITY ARTERIAL DUPLEX  Result Date: 06/13/2022 LOWER EXTREMITY ARTERIAL DUPLEX STUDY Patient Name:  Joshua [name]  Date of Exam:   06/12/2022 Medical Rec #: [id number]           Accession #:    [id number] Date of Birth: 1956-03-12           Patient Gender: M Patient Age:   20 years Exam Location:  [facility] Procedure:      VAS [place] LOWER EXTREMITY ARTERIAL DUPLEX Referring Phys: Harold Barban --------------------------------------------------------------------------------  Indications: New sudden pain, redness and heat of the right leg.  Vascular  Interventions: 04/04/2021                         Pre-operative Diagnosis: Severe right leg claudication  Post-operative diagnosis: Same                         Surgeon: [name]                         Assistants: Laurence Slate                         Procedure: #1: Right femoral to above-knee popliteal                         bypass graft with 9 reversed ipsilateral saphenous vein                         #2: Redo right femoral artery exposure. Current ABI:            R=0.96, L=0.43 Performing Technologist: Ronal Fear RVS, RCS  Examination Guidelines: A complete evaluation includes B-mode imaging, spectral Doppler, color Doppler, and power Doppler as needed of all accessible portions of each vessel. Bilateral testing is considered an integral part of a complete examination. Limited examinations for reoccurring indications may be performed as noted.   Right Graft #1: femoropopliteal +------------------+--------+--------+----------+--------+                   PSV cm/sStenosisWaveform  Comments +------------------+--------+--------+----------+--------+ Inflow            65              biphasic           +------------------+--------+--------+----------+--------+ Prox Anastomosis  60              monophasic         +------------------+--------+--------+----------+--------+ Proximal Graft    57              monophasic         +------------------+--------+--------+----------+--------+ Mid Graft         124             biphasic           +------------------+--------+--------+----------+--------+ Distal Graft      92              monophasic         +------------------+--------+--------+----------+--------+ Distal Anastomosis101             monophasic         +------------------+--------+--------+----------+--------+ Outflow           126             monophasic         +------------------+--------+--------+----------+--------+    +----------+--------+-----+---------------+----------+--------+ LEFT      PSV cm/sRatioStenosis       Waveform  Comments +----------+--------+-----+---------------+----------+--------+ CFA Distal415          75-99% stenosismonophasic         +----------+--------+-----+---------------+----------+--------+ DFA       127                                            +----------+--------+-----+---------------+----------+--------+ SFA Prox  288          50-74% stenosismonophasic         +----------+--------+-----+---------------+----------+--------+ SFA Mid   26  monophasic         +----------+--------+-----+---------------+----------+--------+ SFA Distal39                          monophasic         +----------+--------+-----+---------------+----------+--------+ POP Prox  27                          monophasic         +----------+--------+-----+---------------+----------+--------+ POP Distal23                          monophasic         +----------+--------+-----+---------------+----------+--------+ ATA Distal24                          monophasic         +----------+--------+-----+---------------+----------+--------+ PTA Distal33                          monophasic         +----------+--------+-----+---------------+----------+--------+  Summary: Right: Widely patent bypass without stenosis. No evidence of DVT with limited scan. Left: 75-99% stenosis noted in the common femoral artery. 50-74% stenosis noted in the superficial femoral artery.  See table(s) above for measurements and observations. Electronically signed by Deitra Mayo MD on 06/13/2022 at 8:33:09 AM.    Final    VAS [place] ABI WITH/WO TBI  Result Date: 06/13/2022  LOWER EXTREMITY DOPPLER STUDY Patient Name:  Joshua [name]  Date of Exam:   06/12/2022 Medical Rec #: [id number]           Accession #:    [id number] Date of Birth: 12/03/1955           Patient  Gender: M Patient Age:   36 years Exam Location:  [facility] Procedure:      VAS [place] ABI WITH/WO TBI Referring Phys: --------------------------------------------------------------------------------  Indications: Patient called triage complaining of extreme right lower extremity              pain, redness and swelling 3 days. Patient was scheduled for his 6              month follow up studies.  Vascular Interventions: 04/04/2021                         Pre-operative Diagnosis: Severe right leg claudication                         Post-operative diagnosis: Same                         Surgeon: [name]                         Assistants: Laurence Slate                         Procedure: #1: Right femoral to above-knee popliteal                         bypass graft with 9 reversed ipsilateral saphenous vein                         #  2: Redo right femoral artery exposure. Performing Technologist: Ronal Fear RVS, RCS  Examination Guidelines: A complete evaluation includes at minimum, Doppler waveform signals and systolic blood pressure reading at the level of bilateral brachial, anterior tibial, and posterior tibial arteries, when vessel segments are accessible. Bilateral testing is considered an integral part of a complete examination. Photoelectric Plethysmograph (PPG) waveforms and toe systolic pressure readings are included as required and additional duplex testing as needed. Limited examinations for reoccurring indications may be performed as noted.  ABI Findings: +---------+------------------+-----+----------+--------+ Right    Rt Pressure (mmHg)IndexWaveform  Comment  +---------+------------------+-----+----------+--------+ Brachial 163                                       +---------+------------------+-----+----------+--------+ PTA      150               0.91 monophasic         +---------+------------------+-----+----------+--------+ DP       157               0.96  biphasic           +---------+------------------+-----+----------+--------+ Great Toe122               0.74                    +---------+------------------+-----+----------+--------+ +---------+------------------+-----+----------+-------+ Left     Lt Pressure (mmHg)IndexWaveform  Comment +---------+------------------+-----+----------+-------+ Brachial 164                                      +---------+------------------+-----+----------+-------+ PTA      70                0.43 monophasic        +---------+------------------+-----+----------+-------+ DP       71                0.43 monophasic        +---------+------------------+-----+----------+-------+ Great Toe65                0.40                   +---------+------------------+-----+----------+-------+ +-------+-----------+-----------+------------+------------+ ABI/TBIToday's ABIToday's TBIPrevious ABIPrevious TBI +-------+-----------+-----------+------------+------------+ Right  0.96       0.74       1.03        0.63         +-------+-----------+-----------+------------+------------+ Left   0.43       0.40       0.60        0.53         +-------+-----------+-----------+------------+------------+  Right ABIs appear essentially unchanged. Left ABIs appear decreased compared to prior study on 12/13/2021.  Summary: Right: Resting right ankle-brachial index is within normal range. The right toe-brachial index is normal. Left: Resting left ankle-brachial index indicates severe left lower extremity arterial disease. The left toe-brachial index is abnormal. *See table(s) above for measurements and observations.  Electronically signed by Deitra Mayo MD on 06/13/2022 at 8:28:59 AM.    Final    DG CHEST PORT 1 VIEW  Result Date: 06/12/2022 CLINICAL DATA:  Hyponatremia EXAM: PORTABLE CHEST 1 VIEW COMPARISON:  Previous studies including the examination of 11/14/2020 FINDINGS: Cardiac size is within normal  limits. There are no signs of pulmonary edema. There is linear bandlike  density in the right parahilar region with no significant change suggesting possible scarring from previous intervention. There are no new infiltrates. There is no pleural effusion or pneumothorax. There is surgical fusion in cervical spine. IMPRESSION: Linear density in right parahilar region has not changed significantly suggesting scarring from previous intervention. There are no new infiltrates or signs of pulmonary edema. There is no pleural effusion or pneumothorax. Electronically Signed   By: Elmer Picker M.D.   On: 06/12/2022 20:19   DG Femur Min 2 Views Right  Result Date: 06/12/2022 CLINICAL DATA:  Right leg pain for several days, no known injury, initial encounter EXAM: RIGHT FEMUR 2 VIEWS COMPARISON:  None Available. FINDINGS: Degenerative changes of the right hip joint are noted. Diffuse vascular calcifications are seen. No acute fracture or dislocation is noted. No soft tissue changes are seen. IMPRESSION: No acute abnormality noted. Electronically Signed   By: Inez Catalina M.D.   On: 06/12/2022 19:11   DG Tibia/Fibula Right  Result Date: 06/12/2022 CLINICAL DATA:  Right leg pain, swelling, and redness. Chills, weight loss and pain. EXAM: RIGHT TIBIA AND FIBULA - 2 VIEW COMPARISON:  11/15/2020 FINDINGS: Diffuse soft tissue swelling and edema throughout the visualized right lower extremity. No radiopaque soft tissue foreign bodies or soft tissue gas collections. Surgical clips medial to the right knee. Vascular calcifications. Bones appear intact. No evidence of acute fracture or dislocation of the right tibia or fibula. No focal bone lesion or bone destruction. IMPRESSION: Diffuse soft tissue swelling.  No acute bony abnormalities. Electronically Signed   By: Lucienne Capers M.D.   On: 06/12/2022 19:05   VAS [place] LOWER EXTREMITY VENOUS (DVT) (ONLY [facility] & [facility])  Result Date: 06/12/2022  Lower Venous DVT Study Patient  Name:  Joshua [name]  Date of Exam:   06/12/2022 Medical Rec #: [id number]           Accession #:    [id number] Date of Birth: 10/17/1955           Patient Gender: M Patient Age:   33 years Exam Location:  [facility] Procedure:      VAS [place] LOWER EXTREMITY VENOUS (DVT) Referring Phys: Herbie Baltimore PATERSON --------------------------------------------------------------------------------  Indications: Edema, and Pain.  Comparison Study: previous study 12/17/16 negative. Performing Technologist: McKayla Maag RVT, VT  Examination Guidelines: A complete evaluation includes B-mode imaging, spectral Doppler, color Doppler, and power Doppler as needed of all accessible portions of each vessel. Bilateral testing is considered an integral part of a complete examination. Limited examinations for reoccurring indications may be performed as noted. The reflux portion of the exam is performed with the patient in reverse Trendelenburg.  +--------+---------------+---------+-----------+----------+--------------------+ RIGHT   CompressibilityPhasicitySpontaneityPropertiesThrombus Aging       +--------+---------------+---------+-----------+----------+--------------------+ CFV     Full                                                              +--------+---------------+---------+-----------+----------+--------------------+ SFJ     Full                                                              +--------+---------------+---------+-----------+----------+--------------------+  FV Prox Full                                                              +--------+---------------+---------+-----------+----------+--------------------+ FV Mid  Full                                                              +--------+---------------+---------+-----------+----------+--------------------+ FV                                                   patent by color      Distal                                                doppler              +--------+---------------+---------+-----------+----------+--------------------+ PFV     Full                                                              +--------+---------------+---------+-----------+----------+--------------------+ POP     Full                                                              +--------+---------------+---------+-----------+----------+--------------------+ PTV     Full                                         Not well visualized  +--------+---------------+---------+-----------+----------+--------------------+ PERO    Full           Yes      Yes                  Not well visualized. +--------+---------------+---------+-----------+----------+--------------------+   +----+---------------+---------+-----------+----------+--------------+ LEFTCompressibilityPhasicitySpontaneityPropertiesThrombus Aging +----+---------------+---------+-----------+----------+--------------+ CFV Full           Yes      Yes                                 +----+---------------+---------+-----------+----------+--------------+ SFJ Full                                                        +----+---------------+---------+-----------+----------+--------------+  Summary: RIGHT: - There is no evidence of deep vein thrombosis in the lower extremity. However, portions of this examination were limited- see technologist comments above.  - No cystic structure found in the popliteal fossa. - Ultrasound characteristics of enlarged lymph nodes are noted in the groin.  LEFT: - No evidence of common femoral vein obstruction.  *See table(s) above for measurements and observations. Electronically signed by Deitra Mayo MD on 06/12/2022 at 7:05:14 PM.    Final       Signature  -   Lala Lund M.D on 06/15/2022 at 10:18 AM   -  To page go to [email or web address]

## 2022-06-16 DIAGNOSIS — L03115 Cellulitis of right lower limb: Secondary | ICD-10-CM | POA: Diagnosis not present

## 2022-06-16 LAB — CBC WITH DIFFERENTIAL/PLATELET
Abs Immature Granulocytes: 0.14 10*3/uL — ABNORMAL HIGH (ref 0.00–0.07)
Basophils Absolute: 0.1 10*3/uL (ref 0.0–0.1)
Basophils Relative: 1 %
Eosinophils Absolute: 0.4 10*3/uL (ref 0.0–0.5)
Eosinophils Relative: 3 %
HCT: 41.1 % (ref 39.0–52.0)
Hemoglobin: 14.3 g/dL (ref 13.0–17.0)
Immature Granulocytes: 1 %
Lymphocytes Relative: 16 %
Lymphs Abs: 1.9 10*3/uL (ref 0.7–4.0)
MCH: 29.9 pg (ref 26.0–34.0)
MCHC: 34.8 g/dL (ref 30.0–36.0)
MCV: 86 fL (ref 80.0–100.0)
Monocytes Absolute: 1 10*3/uL (ref 0.1–1.0)
Monocytes Relative: 9 %
Neutro Abs: 8 10*3/uL — ABNORMAL HIGH (ref 1.7–7.7)
Neutrophils Relative %: 70 %
Platelets: 226 10*3/uL (ref 150–400)
RBC: 4.78 MIL/uL (ref 4.22–5.81)
RDW: 15.1 % (ref 11.5–15.5)
WBC: 11.4 10*3/uL — ABNORMAL HIGH (ref 4.0–10.5)
nRBC: 0 % (ref 0.0–0.2)

## 2022-06-16 LAB — C-REACTIVE PROTEIN: CRP: 4.7 mg/dL — ABNORMAL HIGH (ref <1.0)

## 2022-06-16 LAB — BASIC METABOLIC PANEL
Anion gap: 11 (ref 5–15)
BUN: 11 mg/dL (ref 8–23)
CO2: 22 mmol/L (ref 22–32)
Calcium: 8.4 mg/dL — ABNORMAL LOW (ref 8.9–10.3)
Chloride: 105 mmol/L (ref 98–111)
Creatinine, Ser: 1.04 mg/dL (ref 0.61–1.24)
GFR, Estimated: >60 mL/min (ref >60)
Glucose, Bld: 101 mg/dL — ABNORMAL HIGH (ref 70–99)
Potassium: 4.2 mmol/L (ref 3.5–5.1)
Sodium: 138 mmol/L (ref 135–145)

## 2022-06-16 LAB — PROCALCITONIN: Procalcitonin: 0.66 ng/mL

## 2022-06-16 LAB — MAGNESIUM: Magnesium: 2 mg/dL (ref 1.7–2.4)

## 2022-06-16 LAB — BRAIN NATRIURETIC PEPTIDE: B Natriuretic Peptide: 209.8 pg/mL — ABNORMAL HIGH (ref 0.0–100.0)

## 2022-06-16 MED ORDER — SENNOSIDES-DOCUSATE SODIUM 8.6-50 MG PO TABS
2.0000 | ORAL_TABLET | Freq: Two times a day (BID) | ORAL | Status: AC
  Administered 2022-06-16 – 2022-06-19 (×4): 2 via ORAL
  Filled 2022-06-16 (×4): qty 2

## 2022-06-16 MED ORDER — ENOXAPARIN SODIUM 40 MG/0.4ML IJ SOSY
40.0000 mg | PREFILLED_SYRINGE | INTRAMUSCULAR | Status: DC
  Administered 2022-06-16 – 2022-06-19 (×4): 40 mg via SUBCUTANEOUS
  Filled 2022-06-16 (×4): qty 0.4

## 2022-06-16 MED ORDER — POLYETHYLENE GLYCOL 3350 17 G PO PACK
17.0000 g | PACK | Freq: Every day | ORAL | Status: DC | PRN
  Administered 2022-06-16: 17 g via ORAL
  Filled 2022-06-16: qty 1

## 2022-06-16 MED ORDER — ENOXAPARIN SODIUM 40 MG/0.4ML IJ SOSY: 40.0000 mg | PREFILLED_SYRINGE | INTRAMUSCULAR | Status: DC

## 2022-06-16 NOTE — Progress Notes (Signed)
Subjective  -   Says he continues to feel better each day   Physical Exam:  Right leg erythema improved No signs of arterial insufficiency       Assessment/Plan:    Right leg cellulitis: He has made significant improvement on IV antibiotics.  Will defer to primary team regarding length of IV antibiotics and transition to oral antibiotics.  No vascular intervention planned.  I will arrange outpatient follow-up.  Wells Gwendoline Judy 06/16/2022 9:35 AM --  Vitals:   06/16/22 0745 06/16/22 0838  BP: (!) 161/81   Pulse: (!) 57   Resp: 18   Temp: 98.1 F (36.7 C)   SpO2: 95% 97%    Intake/Output Summary (Last 24 hours) at 06/16/2022 0935 Last data filed at 06/16/2022 0400 Gross per 24 hour  Intake 720 ml  Output 1400 ml  Net -680 ml     Laboratory CBC    Component Value Date/Time   WBC 11.4 (H) 06/16/2022 0539   HGB 14.3 06/16/2022 0539   HGB 13.6 12/19/2020 1110   HGB 19.2 (H) 11/25/2016 1430   HCT 41.1 06/16/2022 0539   HCT 38.9 12/19/2020 1110   HCT 54.8 (H) 11/25/2016 1430   PLT 226 06/16/2022 0539   PLT 250 12/19/2020 1110    BMET    Component Value Date/Time   NA 138 06/16/2022 0539   NA 137 12/19/2020 1110   NA 140 11/25/2016 1430   K 4.2 06/16/2022 0539   K 4.3 11/25/2016 1430   CL 105 06/16/2022 0539   CO2 22 06/16/2022 0539   CO2 27 11/25/2016 1430   GLUCOSE 101 (H) 06/16/2022 0539   GLUCOSE 101 11/25/2016 1430   BUN 11 06/16/2022 0539   BUN 9 12/19/2020 1110   BUN 12.6 11/25/2016 1430   CREATININE 1.04 06/16/2022 0539   CREATININE 0.92 07/09/2019 1435   CREATININE 0.9 11/25/2016 1430   CALCIUM 8.4 (L) 06/16/2022 0539   CALCIUM 10.3 11/25/2016 1430   GFRNONAA >60 06/16/2022 0539   GFRAA >60 07/09/2018 1610    COAG Lab Results  Component Value Date   INR 0.9 06/12/2022   INR 0.9 04/03/2021   INR 0.8 11/14/2020   No results found for: "PTT"  Antibiotics Anti-infectives (From admission, onward)    Start     Dose/Rate Route  Frequency Ordered Stop   06/14/22 1700  vancomycin (VANCOREADY) IVPB 1500 mg/300 mL        1,500 mg 150 mL/hr over 120 Minutes Intravenous Every 24 hours 06/14/22 1144     06/13/22 1800  ceFEPIme (MAXIPIME) 2 g in sodium chloride 0.9 % 100 mL IVPB        2 g 200 mL/hr over 30 Minutes Intravenous Every 8 hours 06/13/22 1335     06/13/22 1700  vancomycin (VANCOREADY) IVPB 1250 mg/250 mL  Status:  Discontinued        1,250 mg 166.7 mL/hr over 90 Minutes Intravenous Every 24 hours 06/12/22 1638 06/14/22 1144   06/12/22 2000  ceFEPIme (MAXIPIME) 2 g in sodium chloride 0.9 % 100 mL IVPB  Status:  Discontinued        2 g 200 mL/hr over 30 Minutes Intravenous Every 12 hours 06/12/22 1956 06/13/22 1335   06/12/22 1730  vancomycin (VANCOCIN) 2,500 mg in sodium chloride 0.9 % 500 mL IVPB        2,500 mg 262.5 mL/hr over 120 Minutes Intravenous  Once 06/12/22 1621 06/12/22 1943   06/12/22 1600  cefTRIAXone (ROCEPHIN)  1 g in sodium chloride 0.9 % 100 mL IVPB        1 g 200 mL/hr over 30 Minutes Intravenous  Once 06/12/22 1555 06/12/22 1756        V. Leia Alf, M.D., [facility] [place] Office: [phone] Pager:  [phone]

## 2022-06-16 NOTE — Progress Notes (Addendum)
PROGRESS NOTE                                                                                                                                                                                                             Patient Demographics:    Joshua [name], is a 67 y.o. male, DOB - February 25, 1956, [id number]  Outpatient Primary MD for the patient is Sandi Mariscal, MD    LOS - 4  Admit date - 06/12/2022    Chief Complaint  Patient presents with   Sent by PCP   Leg Pain       Brief Narrative (HPI from H&P)    67 y.o. male with medical history significant of PAD, Status post right femoral to above-the-knee popliteal bypass with vein by Dr. Trula Slade done in January 2023, right common femoral endarterectomy by Dr. Trula Slade done in October 2022, COPD, DM2, GERD, gout, HLD, HTN. Afib not on anticoagulation due to hx of GI bleed who presented to the hospital with 3-day history of right lower extremity redness, was seen in vascular surgery office where he was diagnosed with severe right lower extremity cellulitis and admitted to the hospital.   Subjective:   Patient in bed, appears comfortable, denies any headache, no fever, no chest pain or pressure, no shortness of breath , no abdominal pain. No focal weakness.   Assessment  & Plan :    Severe right lower extremity cellulitis.  Thankfully his ABIs and lower extremity venous duplex are nonacute, seen by vascular surgery Case discussed with vascular surgery on 06/13/2022, he is on empiric IV antibiotics after which she already feels better, continue to monitor cultures and inflammatory markers, MRSA nasal PCR pending.  Clinically improving will continue to monitor.  Appreciate VVS follow-up.  PAD s/p right femoropopliteal breast bypass and right common femoral endarterectomy both over 1 year ago.  No acute issues on exam, continue aspirin and statin for secondary prevention, vascular  surgery following.  ABIs noted along with lower extremity venous duplex.  Both appear nonacute.  CAD in native artery, occluded Lcx and rotational atherectomy to LAD with DES 05/17/15 - no acute issues, chest pain-free, continue aspirin, Crestor 40 mg daily and Zetia 10 mg a day continue Coreg 12.5 mg twice daily.   History of paroxysmal atrial fibrillation.  Mali vas 2 score of greater than  3.  Continue Coreg, not on anticoagulation due to history of GI bleed.  Hypertension.  Coreg, poor control added Norvasc scheduled along with as needed hydralazine.  COPD.  No acute issues.  Supportive care.  Dyslipidemia.  On statin along with Zetia.  GERD and history of GI bleed.  On PPI.  Hypokalemia, hyponatremia and AKI.  Improving with hydration, electrolytes replaced.  Smoking.  Counseled to quit.  NicoDerm patch applied.  DM type II.  Hold p.o. medications.  Sliding scale.  Lab Results  Component Value Date   HGBA1C 6.2 (H) 06/12/2022   CBG (last 3)  Recent Labs    06/14/22 0838 06/14/22 1139 06/14/22 1607  GLUCAP 130* 134* 108*        Condition - Fair  Family Communication  : Present  Code Status : Full code  Consults  : Vascular surgery  PUD Prophylaxis : PPI   Procedures  :     Bilateral lower extremity venous duplex.  No DVT.  Bilateral lower extremity ABI.  Normal on the right side, severe PAD on the left.      Disposition Plan  :    Status is: Inpatient  DVT Prophylaxis  :  Lovenox  enoxaparin (LOVENOX) injection 40 mg Start: 06/16/22 1045 SCDs Start: 06/12/22 2154   Lab Results  Component Value Date   PLT 226 06/16/2022    Diet :  Diet Order             Diet heart healthy/carb modified Room service appropriate? Yes; Fluid consistency: Thin  Diet effective now                    Inpatient Medications  Scheduled Meds:  amLODipine  10 mg Oral Daily   aspirin EC  81 mg Oral Daily   carvedilol  12.5 mg Oral BID   colchicine  0.6 mg  Oral Daily   enoxaparin (LOVENOX) injection  40 mg Subcutaneous Q24H   ezetimibe  10 mg Oral QPM   fluticasone furoate-vilanterol  1 puff Inhalation Daily   And   umeclidinium bromide  1 puff Inhalation Daily   multivitamin with minerals  1 tablet Oral Daily   nicotine  21 mg Transdermal Daily   pantoprazole  40 mg Oral Daily   pregabalin  100 mg Oral TID   Ensure Max Protein  11 oz Oral Daily   rosuvastatin  40 mg Oral QPM   traZODone  100 mg Oral QHS   Continuous Infusions:  ceFEPime (MAXIPIME) IV 2 g (06/16/22 0814)   methocarbamol (ROBAXIN) IV Stopped (06/14/22 0121)   vancomycin 1,500 mg (06/15/22 1536)   PRN Meds:.acetaminophen **OR** acetaminophen, albuterol, fentaNYL (SUBLIMAZE) injection, hydrALAZINE, HYDROcodone-acetaminophen, methocarbamol (ROBAXIN) IV, ondansetron **OR** ondansetron (ZOFRAN) IV, oxyCODONE-acetaminophen    Objective:   Vitals:   06/16/22 0000 06/16/22 0400 06/16/22 0745 06/16/22 0838  BP: (!) 162/68 (!) 159/72 (!) 161/81   Pulse:  62 (!) 57   Resp: 17 16 18    Temp: 98.6 F (37 C) 97.9 F (36.6 C) 98.1 F (36.7 C)   TempSrc: Oral Oral Oral   SpO2: 95% 97% 95% 97%  Weight:      Height:        Wt Readings from Last 3 Encounters:  06/12/22 107.7 kg  06/12/22 107 kg  12/13/21 111.3 kg     Intake/Output Summary (Last 24 hours) at 06/16/2022 1123 Last data filed at 06/16/2022 0400 Gross per 24 hour  Intake 720 ml  Output 1400 ml  Net -680 ml     Physical Exam  Awake Alert, No new F.N deficits, Normal affect [place].AT,PERRAL Supple Neck, No JVD,   Symmetrical Chest wall movement, Good air movement bilaterally, CTAB RRR,No Gallops,Rubs or new Murmurs,  +ve B.Sounds, Abd Soft, No tenderness,   R leg thigh to toes red and warm, redness and warmth much improved, still has right foot edema which he claims is chronic.  Picture from 06/12/22      Data Review:    Recent Labs  Lab 06/12/22 1347 06/13/22 0422 06/14/22 0553 06/15/22 0843  06/16/22 0539  WBC 16.8* 14.7* 11.8* 10.5 11.4*  HGB 14.2 13.0 13.2 13.9 14.3  HCT 41.8 38.5* 38.0* 39.9 41.1  PLT 158 150 149* 215 226  MCV 89.3 89.7 89.0 86.4 86.0  MCH 30.3 30.3 30.9 30.1 29.9  MCHC 34.0 33.8 34.7 34.8 34.8  RDW 15.8* 15.9* 15.9* 15.4 15.1  LYMPHSABS 1.2  --  1.6 1.4 1.9  MONOABS 1.2*  --  1.1* 0.9 1.0  EOSABS 0.1  --  0.3 0.3 0.4  BASOSABS 0.0  --  0.1 0.1 0.1    Recent Labs  Lab 06/12/22 1345 06/12/22 1347 06/12/22 1347 06/12/22 1620 06/12/22 1951 06/12/22 1955 06/12/22 2327 06/13/22 0422 06/14/22 0553 06/15/22 0843 06/16/22 0539  NA  --  132*   < >  --   --  135  --  137 138 136 138  K  --  3.9   < >  --   --  3.4*  --  3.8 4.6 3.3* 4.2  CL  --  96*   < >  --   --  97*  --  102 104 100 105  CO2  --  25   < >  --   --  25  --  25 23 22 22   ANIONGAP  --  11   < >  --   --  13  --  10 11 14 11   GLUCOSE  --  127*   < >  --   --  93  --  99 104* 122* 101*  BUN  --  29*   < >  --   --  24*  --  22 16 11 11   CREATININE  --  1.56*   < >  --   --  1.32*  --  1.27* 1.07 1.03 1.04  AST  --  21  --   --   --   --   --  18  --   --   --   ALT  --  11  --   --   --   --   --  12  --   --   --   ALKPHOS  --  137*  --   --   --   --   --  129*  --   --   --   BILITOT  --  0.8  --   --   --   --   --  0.7  --   --   --   ALBUMIN  --  2.3*  --   --   --   --   --  2.0*  --   --   --   CRP  --   --   --   --   --   --   --  21.1* 16.2* 8.5* 4.7*  PROCALCITON  --   --   --   --   --   --   --  3.33 1.86 1.01 0.66  LATICACIDVEN 1.1  --   --  1.4  --   --   --   --   --   --   --   INR  --   --   --   --   --  0.9  --   --   --   --   --   TSH  --   --   --   --  1.901  --   --   --   --   --   --   HGBA1C  --   --   --   --   --   --  6.2*  --   --   --   --   BNP  --   --   --   --   --   --   --  347.9* 466.5* 308.9* 209.8*  MG  --   --   --   --   --  1.8  --  2.0 2.0 1.7 2.0  CALCIUM  --  8.3*   < >  --   --  7.8*  --  8.1* 8.4* 8.6* 8.4*   < > = values in  this interval not displayed.    Lab Results  Component Value Date   HGBA1C 6.2 (H) 06/12/2022   No results for input(s): "TSH", "T4TOTAL", "T3FREE", "THYROIDAB" in the last 72 hours.  Invalid input(s): "FREET3"  Micro Results Recent Results (from the past 240 hour(s))  Blood culture (routine x 2)     Status: None (Preliminary result)   Collection Time: 06/12/22  4:20 PM   Specimen: BLOOD  Result Value Ref Range Status   Specimen Description BLOOD RIGHT ANTECUBITAL  Final   Special Requests   Final    BOTTLES DRAWN AEROBIC AND ANAEROBIC Blood Culture adequate volume   Culture   Final    NO GROWTH 4 DAYS Performed at [facility], [address], [place], [place] [address]    Report Status PENDING  Incomplete  Blood culture (routine x 2)     Status: None (Preliminary result)   Collection Time: 06/12/22  4:40 PM   Specimen: BLOOD  Result Value Ref Range Status   Specimen Description BLOOD LEFT ANTECUBITAL  Final   Special Requests   Final    BOTTLES DRAWN AEROBIC AND ANAEROBIC Blood Culture adequate volume   Culture   Final    NO GROWTH 4 DAYS Performed at [facility], [place] [address] [place], [place] [address]    Report Status PENDING  Incomplete  MRSA Next Gen by PCR, Nasal     Status: None   Collection Time: 06/14/22  4:28 PM   Specimen: Nasal Mucosa; Nasal Swab  Result Value Ref Range Status   MRSA by PCR Next Gen NOT DETECTED NOT DETECTED Final    Comment: (NOTE) The GeneXpert MRSA Assay (FDA approved for NASAL specimens only), is one component of a comprehensive MRSA colonization surveillance program. It is not intended to diagnose MRSA infection nor to guide or monitor treatment for MRSA infections. Test performance is not FDA approved in patients less than 106 years old. Performed at [facility], [place] [address] [place], [place] [address]     Radiology Reports VAS [place] LOWER EXTREMITY  ARTERIAL DUPLEX  Result Date: 06/13/2022 LOWER  EXTREMITY ARTERIAL DUPLEX STUDY Patient Name:  AARYA IWANOWSKI Odonell  Date of Exam:   06/12/2022 Medical Rec #: [id number]           Accession #:    [id number] Date of Birth: Mar 29, 1955           Patient Gender: M Patient Age:   27 years Exam Location:  [facility] Procedure:      VAS [place] LOWER EXTREMITY ARTERIAL DUPLEX Referring Phys: Harold Barban --------------------------------------------------------------------------------  Indications: New sudden pain, redness and heat of the right leg.  Vascular Interventions: 04/04/2021                         Pre-operative Diagnosis: Severe right leg claudication                         Post-operative diagnosis: Same                         Surgeon: [name]                         Assistants: Laurence Slate                         Procedure: #1: Right femoral to above-knee popliteal                         bypass graft with 9 reversed ipsilateral saphenous vein                         #2: Redo right femoral artery exposure. Current ABI:            R=0.96, L=0.43 Performing Technologist: Ronal Fear RVS, RCS  Examination Guidelines: A complete evaluation includes B-mode imaging, spectral Doppler, color Doppler, and power Doppler as needed of all accessible portions of each vessel. Bilateral testing is considered an integral part of a complete examination. Limited examinations for reoccurring indications may be performed as noted.   Right Graft #1: femoropopliteal +------------------+--------+--------+----------+--------+                   PSV cm/sStenosisWaveform  Comments +------------------+--------+--------+----------+--------+ Inflow            65              biphasic           +------------------+--------+--------+----------+--------+ Prox Anastomosis  60              monophasic         +------------------+--------+--------+----------+--------+ Proximal Graft    57              monophasic          +------------------+--------+--------+----------+--------+ Mid Graft         124             biphasic           +------------------+--------+--------+----------+--------+ Distal Graft      92              monophasic         +------------------+--------+--------+----------+--------+ Distal Anastomosis101             monophasic         +------------------+--------+--------+----------+--------+ Outflow  126             monophasic         +------------------+--------+--------+----------+--------+   +----------+--------+-----+---------------+----------+--------+ LEFT      PSV cm/sRatioStenosis       Waveform  Comments +----------+--------+-----+---------------+----------+--------+ CFA Distal415          75-99% stenosismonophasic         +----------+--------+-----+---------------+----------+--------+ DFA       127                                            +----------+--------+-----+---------------+----------+--------+ SFA Prox  288          50-74% stenosismonophasic         +----------+--------+-----+---------------+----------+--------+ SFA Mid   26                          monophasic         +----------+--------+-----+---------------+----------+--------+ SFA Distal39                          monophasic         +----------+--------+-----+---------------+----------+--------+ POP Prox  27                          monophasic         +----------+--------+-----+---------------+----------+--------+ POP Distal23                          monophasic         +----------+--------+-----+---------------+----------+--------+ ATA Distal24                          monophasic         +----------+--------+-----+---------------+----------+--------+ PTA Distal33                          monophasic         +----------+--------+-----+---------------+----------+--------+  Summary: Right: Widely patent bypass without stenosis. No evidence of  DVT with limited scan. Left: 75-99% stenosis noted in the common femoral artery. 50-74% stenosis noted in the superficial femoral artery.  See table(s) above for measurements and observations. Electronically signed by Deitra Mayo MD on 06/13/2022 at 8:33:09 AM.    Final    VAS [place] ABI WITH/WO TBI  Result Date: 06/13/2022  LOWER EXTREMITY DOPPLER STUDY Patient Name:  SHIVAAN DUANE  Date of Exam:   06/12/2022 Medical Rec #: [id number]           Accession #:    [id number] Date of Birth: December 27, 1955           Patient Gender: M Patient Age:   48 years Exam Location:  [facility] Procedure:      VAS [place] ABI WITH/WO TBI Referring Phys: --------------------------------------------------------------------------------  Indications: Patient called triage complaining of extreme right lower extremity              pain, redness and swelling 3 days. Patient was scheduled for his 6              month follow up studies.  Vascular Interventions: 04/04/2021  Pre-operative Diagnosis: Severe right leg claudication                         Post-operative diagnosis: Same                         Surgeon: [name]                         Assistants: Laurence Slate                         Procedure: #1: Right femoral to above-knee popliteal                         bypass graft with 9 reversed ipsilateral saphenous vein                         #2: Redo right femoral artery exposure. Performing Technologist: Ronal Fear RVS, RCS  Examination Guidelines: A complete evaluation includes at minimum, Doppler waveform signals and systolic blood pressure reading at the level of bilateral brachial, anterior tibial, and posterior tibial arteries, when vessel segments are accessible. Bilateral testing is considered an integral part of a complete examination. Photoelectric Plethysmograph (PPG) waveforms and toe systolic pressure readings are included as required and additional duplex testing as  needed. Limited examinations for reoccurring indications may be performed as noted.  ABI Findings: +---------+------------------+-----+----------+--------+ Right    Rt Pressure (mmHg)IndexWaveform  Comment  +---------+------------------+-----+----------+--------+ Brachial 163                                       +---------+------------------+-----+----------+--------+ PTA      150               0.91 monophasic         +---------+------------------+-----+----------+--------+ DP       157               0.96 biphasic           +---------+------------------+-----+----------+--------+ Great Toe122               0.74                    +---------+------------------+-----+----------+--------+ +---------+------------------+-----+----------+-------+ Left     Lt Pressure (mmHg)IndexWaveform  Comment +---------+------------------+-----+----------+-------+ Brachial 164                                      +---------+------------------+-----+----------+-------+ PTA      70                0.43 monophasic        +---------+------------------+-----+----------+-------+ DP       71                0.43 monophasic        +---------+------------------+-----+----------+-------+ Great Toe65                0.40                   +---------+------------------+-----+----------+-------+ +-------+-----------+-----------+------------+------------+ ABI/TBIToday's ABIToday's TBIPrevious ABIPrevious TBI +-------+-----------+-----------+------------+------------+ Right  0.96       0.74       1.03  0.63         +-------+-----------+-----------+------------+------------+ Left   0.43       0.40       0.60        0.53         +-------+-----------+-----------+------------+------------+  Right ABIs appear essentially unchanged. Left ABIs appear decreased compared to prior study on 12/13/2021.  Summary: Right: Resting right ankle-brachial index is within normal range. The  right toe-brachial index is normal. Left: Resting left ankle-brachial index indicates severe left lower extremity arterial disease. The left toe-brachial index is abnormal. *See table(s) above for measurements and observations.  Electronically signed by Deitra Mayo MD on 06/13/2022 at 8:28:59 AM.    Final    DG CHEST PORT 1 VIEW  Result Date: 06/12/2022 CLINICAL DATA:  Hyponatremia EXAM: PORTABLE CHEST 1 VIEW COMPARISON:  Previous studies including the examination of 11/14/2020 FINDINGS: Cardiac size is within normal limits. There are no signs of pulmonary edema. There is linear bandlike density in the right parahilar region with no significant change suggesting possible scarring from previous intervention. There are no new infiltrates. There is no pleural effusion or pneumothorax. There is surgical fusion in cervical spine. IMPRESSION: Linear density in right parahilar region has not changed significantly suggesting scarring from previous intervention. There are no new infiltrates or signs of pulmonary edema. There is no pleural effusion or pneumothorax. Electronically Signed   By: Elmer Picker M.D.   On: 06/12/2022 20:19   DG Femur Min 2 Views Right  Result Date: 06/12/2022 CLINICAL DATA:  Right leg pain for several days, no known injury, initial encounter EXAM: RIGHT FEMUR 2 VIEWS COMPARISON:  None Available. FINDINGS: Degenerative changes of the right hip joint are noted. Diffuse vascular calcifications are seen. No acute fracture or dislocation is noted. No soft tissue changes are seen. IMPRESSION: No acute abnormality noted. Electronically Signed   By: Inez Catalina M.D.   On: 06/12/2022 19:11   DG Tibia/Fibula Right  Result Date: 06/12/2022 CLINICAL DATA:  Right leg pain, swelling, and redness. Chills, weight loss and pain. EXAM: RIGHT TIBIA AND FIBULA - 2 VIEW COMPARISON:  11/15/2020 FINDINGS: Diffuse soft tissue swelling and edema throughout the visualized right lower extremity. No  radiopaque soft tissue foreign bodies or soft tissue gas collections. Surgical clips medial to the right knee. Vascular calcifications. Bones appear intact. No evidence of acute fracture or dislocation of the right tibia or fibula. No focal bone lesion or bone destruction. IMPRESSION: Diffuse soft tissue swelling.  No acute bony abnormalities. Electronically Signed   By: Lucienne Capers M.D.   On: 06/12/2022 19:05   VAS [place] LOWER EXTREMITY VENOUS (DVT) (ONLY [facility] & [facility])  Result Date: 06/12/2022  Lower Venous DVT Study Patient Name:  GERVASE PETRICCA  Date of Exam:   06/12/2022 Medical Rec #: [id number]           Accession #:    [id number] Date of Birth: [birth year]           Patient Gender: M Patient Age:   35 years Exam Location:  [facility] Procedure:      VAS [place] LOWER EXTREMITY VENOUS (DVT) Referring Phys: Herbie Baltimore PATERSON --------------------------------------------------------------------------------  Indications: Edema, and Pain.  Comparison Study: previous study 12/17/16 negative. Performing Technologist: McKayla Maag RVT, VT  Examination Guidelines: A complete evaluation includes B-mode imaging, spectral Doppler, color Doppler, and power Doppler as needed of all accessible portions of each vessel. Bilateral testing is considered an integral part of a complete  examination. Limited examinations for reoccurring indications may be performed as noted. The reflux portion of the exam is performed with the patient in reverse Trendelenburg.  +--------+---------------+---------+-----------+----------+--------------------+ RIGHT   CompressibilityPhasicitySpontaneityPropertiesThrombus Aging       +--------+---------------+---------+-----------+----------+--------------------+ CFV     Full                                                              +--------+---------------+---------+-----------+----------+--------------------+ SFJ     Full                                                               +--------+---------------+---------+-----------+----------+--------------------+ FV Prox Full                                                              +--------+---------------+---------+-----------+----------+--------------------+ FV Mid  Full                                                              +--------+---------------+---------+-----------+----------+--------------------+ FV                                                   patent by color      Distal                                               doppler              +--------+---------------+---------+-----------+----------+--------------------+ PFV     Full                                                              +--------+---------------+---------+-----------+----------+--------------------+ POP     Full                                                              +--------+---------------+---------+-----------+----------+--------------------+ PTV     Full  Not well visualized  +--------+---------------+---------+-----------+----------+--------------------+ PERO    Full           Yes      Yes                  Not well visualized. +--------+---------------+---------+-----------+----------+--------------------+   +----+---------------+---------+-----------+----------+--------------+ LEFTCompressibilityPhasicitySpontaneityPropertiesThrombus Aging +----+---------------+---------+-----------+----------+--------------+ CFV Full           Yes      Yes                                 +----+---------------+---------+-----------+----------+--------------+ SFJ Full                                                        +----+---------------+---------+-----------+----------+--------------+     Summary: RIGHT: - There is no evidence of deep vein thrombosis in the lower extremity. However, portions of this examination were limited- see  technologist comments above.  - No cystic structure found in the popliteal fossa. - Ultrasound characteristics of enlarged lymph nodes are noted in the groin.  LEFT: - No evidence of common femoral vein obstruction.  *See table(s) above for measurements and observations. Electronically signed by Deitra Mayo MD on 06/12/2022 at 7:05:14 PM.    Final       Signature  -   Lala Lund M.D on 06/16/2022 at 11:23 AM   -  To page go to [email or web address]

## 2022-06-16 NOTE — Progress Notes (Signed)
Mobility Specialist: Progress Note   06/16/22 1407  Mobility  Activity Ambulated with assistance in hallway  Level of Assistance Standby assist, set-up cues, supervision of patient - no hands on  Assistive Device None  Distance Ambulated (ft) 350 ft  Activity Response Tolerated well  Mobility Referral Yes  $Mobility charge 1 Mobility   Received pt sitting EOB having no complaints and agreeable to mobility. Pt was asymptomatic throughout ambulation and returned to room w/o fault. Left sitting EOB w/ call bell in reach and all needs met.  Bridgeton Gavyn Zoss Mobility Specialist Please contact via SecureChat or Rehab office at [phone]

## 2022-06-17 DIAGNOSIS — L03115 Cellulitis of right lower limb: Secondary | ICD-10-CM | POA: Diagnosis not present

## 2022-06-17 LAB — CBC WITH DIFFERENTIAL/PLATELET
Abs Immature Granulocytes: 0.12 10*3/uL — ABNORMAL HIGH (ref 0.00–0.07)
Basophils Absolute: 0.1 10*3/uL (ref 0.0–0.1)
Basophils Relative: 1 %
Eosinophils Absolute: 0.4 10*3/uL (ref 0.0–0.5)
Eosinophils Relative: 3 %
HCT: 43.3 % (ref 39.0–52.0)
Hemoglobin: 14.5 g/dL (ref 13.0–17.0)
Immature Granulocytes: 1 %
Lymphocytes Relative: 15 %
Lymphs Abs: 1.6 10*3/uL (ref 0.7–4.0)
MCH: 30.1 pg (ref 26.0–34.0)
MCHC: 33.5 g/dL (ref 30.0–36.0)
MCV: 89.8 fL (ref 80.0–100.0)
Monocytes Absolute: 0.7 10*3/uL (ref 0.1–1.0)
Monocytes Relative: 7 %
Neutro Abs: 8 10*3/uL — ABNORMAL HIGH (ref 1.7–7.7)
Neutrophils Relative %: 73 %
Platelets: 279 10*3/uL (ref 150–400)
RBC: 4.82 MIL/uL (ref 4.22–5.81)
RDW: 15 % (ref 11.5–15.5)
WBC: 10.9 10*3/uL — ABNORMAL HIGH (ref 4.0–10.5)
nRBC: 0 % (ref 0.0–0.2)

## 2022-06-17 LAB — BASIC METABOLIC PANEL
Anion gap: 12 (ref 5–15)
BUN: 11 mg/dL (ref 8–23)
CO2: 25 mmol/L (ref 22–32)
Calcium: 8.7 mg/dL — ABNORMAL LOW (ref 8.9–10.3)
Chloride: 100 mmol/L (ref 98–111)
Creatinine, Ser: 1.1 mg/dL (ref 0.61–1.24)
GFR, Estimated: >60 mL/min (ref >60)
Glucose, Bld: 115 mg/dL — ABNORMAL HIGH (ref 70–99)
Potassium: 3.4 mmol/L — ABNORMAL LOW (ref 3.5–5.1)
Sodium: 137 mmol/L (ref 135–145)

## 2022-06-17 LAB — CULTURE, BLOOD (ROUTINE X 2)
Culture: NO GROWTH
Culture: NO GROWTH
Special Requests: ADEQUATE
Special Requests: ADEQUATE

## 2022-06-17 LAB — C-REACTIVE PROTEIN: CRP: 2.9 mg/dL — ABNORMAL HIGH (ref <1.0)

## 2022-06-17 LAB — MAGNESIUM: Magnesium: 1.7 mg/dL (ref 1.7–2.4)

## 2022-06-17 LAB — PROCALCITONIN: Procalcitonin: 0.46 ng/mL

## 2022-06-17 LAB — BRAIN NATRIURETIC PEPTIDE: B Natriuretic Peptide: 238.7 pg/mL — ABNORMAL HIGH (ref 0.0–100.0)

## 2022-06-17 MED ORDER — POTASSIUM CHLORIDE CRYS ER 20 MEQ PO TBCR
40.0000 meq | EXTENDED_RELEASE_TABLET | Freq: Once | ORAL | Status: AC
  Administered 2022-06-17: 40 meq via ORAL
  Filled 2022-06-17: qty 2

## 2022-06-17 MED ORDER — MAGNESIUM SULFATE 2 GM/50ML IV SOLN
2.0000 g | Freq: Once | INTRAVENOUS | Status: AC
  Administered 2022-06-17: 2 g via INTRAVENOUS
  Filled 2022-06-17: qty 50

## 2022-06-17 NOTE — Progress Notes (Signed)
PROGRESS NOTE                                                                                                                                                                                                             Patient Demographics:    Joshua [name], is a 67 y.o. male, DOB - Dec 06, 1955, [id number]  Outpatient Primary MD for the patient is Sandi Mariscal, MD    LOS - 5  Admit date - 06/12/2022    Chief Complaint  Patient presents with   Sent by PCP   Leg Pain       Brief Narrative (HPI from H&P)    67 y.o. male with medical history significant of PAD, Status post right femoral to above-the-knee popliteal bypass with vein by Dr. Trula Slade done in January 2023, right common femoral endarterectomy by Dr. Trula Slade done in October 2022, COPD, DM2, GERD, gout, HLD, HTN. Afib not on anticoagulation due to hx of GI bleed who presented to the hospital with 3-day history of right lower extremity redness, was seen in vascular surgery office where he was diagnosed with severe right lower extremity cellulitis and admitted to the hospital.   Subjective:   Patient in bed, appears comfortable, denies any headache, no fever, no chest pain or pressure, no shortness of breath , no abdominal pain. No new focal weakness.   Assessment  & Plan :    Severe right lower extremity cellulitis.  Thankfully his ABIs and lower extremity venous duplex are nonacute, seen by vascular surgery Case discussed with vascular surgery on 06/13/2022, he is on empiric IV antibiotics after which she already feels better, continue to monitor cultures and inflammatory markers, MRSA nasal PCR pending.  Clinically improving will continue to monitor.  Appreciate VVS follow-up.  PAD s/p right femoropopliteal breast bypass and right common femoral endarterectomy both over 1 year ago.  No acute issues on exam, continue aspirin and statin for secondary prevention, vascular  surgery following.  ABIs noted along with lower extremity venous duplex.  Both appear nonacute.  CAD in native artery, occluded Lcx and rotational atherectomy to LAD with DES 05/17/15 - no acute issues, chest pain-free, continue aspirin, Crestor 40 mg daily and Zetia 10 mg a day continue Coreg 12.5 mg twice daily.   History of paroxysmal atrial fibrillation.  Mali vas 2 score of greater  than 3.  Continue Coreg, not on anticoagulation due to history of GI bleed.  Hypertension.  Coreg, poor control added Norvasc scheduled along with as needed hydralazine.  COPD.  No acute issues.  Supportive care.  Dyslipidemia.  On statin along with Zetia.  GERD and history of GI bleed.  On PPI.  Hypokalemia, hyponatremia and AKI.  Improving with hydration, electrolytes replaced.  Smoking.  Counseled to quit.  NicoDerm patch applied.  DM type II.  Hold p.o. medications.  Sliding scale.  Lab Results  Component Value Date   HGBA1C 6.2 (H) 06/12/2022   CBG (last 3)  Recent Labs    06/14/22 0838 06/14/22 1139 06/14/22 1607  GLUCAP 130* 134* 108*        Condition - Fair  Family Communication  : Present  Code Status : Full code  Consults  : Vascular surgery  PUD Prophylaxis : PPI   Procedures  :     Bilateral lower extremity venous duplex.  No DVT.  Bilateral lower extremity ABI.  Normal on the right side, severe PAD on the left.      Disposition Plan  :    Status is: Inpatient  DVT Prophylaxis  :  Lovenox  enoxaparin (LOVENOX) injection 40 mg Start: 06/16/22 1045 SCDs Start: 06/12/22 2154   Lab Results  Component Value Date   PLT 226 06/16/2022    Diet :  Diet Order             Diet heart healthy/carb modified Room service appropriate? Yes; Fluid consistency: Thin  Diet effective now                    Inpatient Medications  Scheduled Meds:  amLODipine  10 mg Oral Daily   aspirin EC  81 mg Oral Daily   carvedilol  12.5 mg Oral BID   colchicine  0.6 mg  Oral Daily   enoxaparin (LOVENOX) injection  40 mg Subcutaneous Q24H   ezetimibe  10 mg Oral QPM   fluticasone furoate-vilanterol  1 puff Inhalation Daily   And   umeclidinium bromide  1 puff Inhalation Daily   multivitamin with minerals  1 tablet Oral Daily   nicotine  21 mg Transdermal Daily   pantoprazole  40 mg Oral Daily   pregabalin  100 mg Oral TID   Ensure Max Protein  11 oz Oral Daily   rosuvastatin  40 mg Oral QPM   senna-docusate  2 tablet Oral BID   traZODone  100 mg Oral QHS   Continuous Infusions:  ceFEPime (MAXIPIME) IV 2 g (06/17/22 0150)   methocarbamol (ROBAXIN) IV Stopped (06/14/22 0121)   vancomycin 1,500 mg (06/16/22 1641)   PRN Meds:.acetaminophen **OR** acetaminophen, albuterol, fentaNYL (SUBLIMAZE) injection, hydrALAZINE, HYDROcodone-acetaminophen, methocarbamol (ROBAXIN) IV, ondansetron **OR** ondansetron (ZOFRAN) IV, oxyCODONE-acetaminophen, polyethylene glycol    Objective:   Vitals:   06/16/22 1100 06/16/22 1724 06/16/22 2014 06/17/22 0725  BP: (!) 154/72 (!) 156/82 (!) 155/71   Pulse: (!) 53 (!) 54 (!) 57   Resp: 18 18 18    Temp: 98.2 F (36.8 C) (!) 97.5 F (36.4 C) 98.3 F (36.8 C)   TempSrc: Oral Oral Oral   SpO2: 97% 95% 97% 96%  Weight:      Height:        Wt Readings from Last 3 Encounters:  06/12/22 107.7 kg  06/12/22 107 kg  12/13/21 111.3 kg     Intake/Output Summary (Last 24 hours) at 06/17/2022 0753 Last  data filed at 06/17/2022 0300 Gross per 24 hour  Intake 600 ml  Output 750 ml  Net -150 ml     Physical Exam  Awake Alert, No new F.N deficits, Normal affect [place].AT,PERRAL Supple Neck, No JVD,   Symmetrical Chest wall movement, Good air movement bilaterally, CTAB RRR,No Gallops,Rubs or new Murmurs,  +ve B.Sounds, Abd Soft, No tenderness,   R leg thigh to toes red and warm, redness and warmth much improved, still has right foot edema which he claims is chronic.  Picture from 06/12/22      Data Review:    Recent  Labs  Lab 06/12/22 1347 06/13/22 0422 06/14/22 0553 06/15/22 0843 06/16/22 0539  WBC 16.8* 14.7* 11.8* 10.5 11.4*  HGB 14.2 13.0 13.2 13.9 14.3  HCT 41.8 38.5* 38.0* 39.9 41.1  PLT 158 150 149* 215 226  MCV 89.3 89.7 89.0 86.4 86.0  MCH 30.3 30.3 30.9 30.1 29.9  MCHC 34.0 33.8 34.7 34.8 34.8  RDW 15.8* 15.9* 15.9* 15.4 15.1  LYMPHSABS 1.2  --  1.6 1.4 1.9  MONOABS 1.2*  --  1.1* 0.9 1.0  EOSABS 0.1  --  0.3 0.3 0.4  BASOSABS 0.0  --  0.1 0.1 0.1    Recent Labs  Lab 06/12/22 1345 06/12/22 1347 06/12/22 1347 06/12/22 1620 06/12/22 1951 06/12/22 1955 06/12/22 2327 06/13/22 0422 06/14/22 0553 06/15/22 0843 06/16/22 0539  NA  --  132*   < >  --   --  135  --  137 138 136 138  K  --  3.9   < >  --   --  3.4*  --  3.8 4.6 3.3* 4.2  CL  --  96*   < >  --   --  97*  --  102 104 100 105  CO2  --  25   < >  --   --  25  --  25 23 22 22   ANIONGAP  --  11   < >  --   --  13  --  10 11 14 11   GLUCOSE  --  127*   < >  --   --  93  --  99 104* 122* 101*  BUN  --  29*   < >  --   --  24*  --  22 16 11 11   CREATININE  --  1.56*   < >  --   --  1.32*  --  1.27* 1.07 1.03 1.04  AST  --  21  --   --   --   --   --  18  --   --   --   ALT  --  11  --   --   --   --   --  12  --   --   --   ALKPHOS  --  137*  --   --   --   --   --  129*  --   --   --   BILITOT  --  0.8  --   --   --   --   --  0.7  --   --   --   ALBUMIN  --  2.3*  --   --   --   --   --  2.0*  --   --   --   CRP  --   --   --   --   --   --   --  21.1* 16.2* 8.5* 4.7*  PROCALCITON  --   --   --   --   --   --   --  3.33 1.86 1.01 0.66  LATICACIDVEN 1.1  --   --  1.4  --   --   --   --   --   --   --   INR  --   --   --   --   --  0.9  --   --   --   --   --   TSH  --   --   --   --  1.901  --   --   --   --   --   --   HGBA1C  --   --   --   --   --   --  6.2*  --   --   --   --   BNP  --   --   --   --   --   --   --  347.9* 466.5* 308.9* 209.8*  MG  --   --   --   --   --  1.8  --  2.0 2.0 1.7 2.0  CALCIUM  --   8.3*   < >  --   --  7.8*  --  8.1* 8.4* 8.6* 8.4*   < > = values in this interval not displayed.   Micro Results Recent Results (from the past 240 hour(s))  Blood culture (routine x 2)     Status: None   Collection Time: 06/12/22  4:20 PM   Specimen: BLOOD  Result Value Ref Range Status   Specimen Description BLOOD RIGHT ANTECUBITAL  Final   Special Requests   Final    BOTTLES DRAWN AEROBIC AND ANAEROBIC Blood Culture adequate volume   Culture   Final    NO GROWTH 5 DAYS Performed at [facility], [address] [place], [place] [address]    Report Status 06/17/2022 FINAL  Final  Blood culture (routine x 2)     Status: None   Collection Time: 06/12/22  4:40 PM   Specimen: BLOOD  Result Value Ref Range Status   Specimen Description BLOOD LEFT ANTECUBITAL  Final   Special Requests   Final    BOTTLES DRAWN AEROBIC AND ANAEROBIC Blood Culture adequate volume   Culture   Final    NO GROWTH 5 DAYS Performed at [facility], [place] [address], [place], [place] [address]    Report Status 06/17/2022 FINAL  Final  MRSA Next Gen by PCR, Nasal     Status: None   Collection Time: 06/14/22  4:28 PM   Specimen: Nasal Mucosa; Nasal Swab  Result Value Ref Range Status   MRSA by PCR Next Gen NOT DETECTED NOT DETECTED Final    Comment: (NOTE) The GeneXpert MRSA Assay (FDA approved for NASAL specimens only), is one component of a comprehensive MRSA colonization surveillance program. It is not intended to diagnose MRSA infection nor to guide or monitor treatment for MRSA infections. Test performance is not FDA approved in patients less than 35 years old. Performed at [facility], [place] [address], [place], [place] [address]     Radiology Reports No results found.    Signature  -   Lala Lund M.D on 06/17/2022 at 7:53 AM   -  To page go to [email or web address]

## 2022-06-17 NOTE — Progress Notes (Addendum)
Pharmacy Antibiotic Note  Joshua [name] is a 67 y.o. male admitted on 06/12/2022 with severe cellulitis.  Pharmacy has been consulted for Vancomycin and Cefepime dosing.  Renal function now stable. Noted clinically improving. CRP trending down  Plan: Cefepime 2gm IV q8hrs. Continue Vancomycin 1500 mg IV Q 24 hrs Goal AUC 400-550. Expected AUC: 458 SCr used: 1.07 Follow renal function, culture data, clinical progress and antibiotic plans. Will check vanc levels this week if Vanc to continue more than a few more days.  Height: 5\' 9"  (175.3 cm) Weight: 107.7 kg (237 lb 7 oz) IBW/kg (Calculated) : 70.7  Temp (24hrs), Avg:98.1 F (36.7 C), Min:97.5 F (36.4 C), Max:98.4 F (36.9 C)  Recent Labs  Lab 06/12/22 1345 06/12/22 1347 06/12/22 1620 06/12/22 1955 06/13/22 0422 06/14/22 0553 06/15/22 0843 06/16/22 0539 06/17/22 0722  WBC  --    < >  --   --  14.7* 11.8* 10.5 11.4* 10.9*  CREATININE  --    < >  --    < > 1.27* 1.07 1.03 1.04 1.10  LATICACIDVEN 1.1  --  1.4  --   --   --   --   --   --    < > = values in this interval not displayed.     Estimated Creatinine Clearance: 79.9 mL/min (by C-G formula based on SCr of 1.1 mg/dL).    Allergies  Allergen Reactions   Zestril [Lisinopril] Hives and Itching   Contrast Media [Iodinated Contrast Media] Hives and Itching    OK if pt has Benadryl pre-med.   Elavil [Amitriptyline] Nausea Only and Other (See Comments)    Makes pt "feel weird"   Gadolinium Derivatives Other (See Comments)    Unknown reaction   Motrin [Ibuprofen] Other (See Comments)    Internal bleeding    Antimicrobials this admission: Ceftriaxone x 1 on 3/27 Vancomycin 3/27>> Cefepime 3/27>>  Dose adjustments this admission: 3/28: Cefepime 2gm IV q12h > q8h for improved renal function 3/29: Vanc 1250 > 1500 mg IV q24h for improved renal function  Microbiology results: 3/27 blood: negative 3/29 MRSA PCR: negative  Thank you for allowing pharmacy  to be a part of this patient's care.  Arty Baumgartner, [place] 06/17/2022 4:48 PM

## 2022-06-17 NOTE — Progress Notes (Signed)
Physical Therapy Treatment Patient Details Name: Joshua [name] MRN: [id number] DOB: November 09, 1955 Today's Date: 06/17/2022   History of Present Illness 67 y.o. male admitted to [facility] on 3/27 for 3 day history of RLE redness diagnosed as cellulitis and sent to ED by PCP. PMHx: PAD, s/p right femoral to above-the-knee popliteal bypass with vein January 2023, right common femoral endarterectomy October 2022, COPD, DM2, GERD, gout, HLD, HTN, Afib not on anticoagulation due to hx of GI bleed.    PT Comments    Patient progressing well towards PT goals. Session focused on progression ambulation and mobility. Pt reports RLE is getting better with no pain today. Continues to have some redness and warmth as well as swelling but much improved each day. Ambulating and mobilizing Mod I without use of DME. Antalgic like gait which is baseline due to dealing with this for ~1 year. Tolerated higher level balance challenges without difficulty- stepping over objects, walking backwards, head turns etc. Pt does not require further skilled therapy services as pt functioning at Mod I level. All education completed. Goals met. Discharge from therapy. Continue working with Risk analyst.     Recommendations for follow up therapy are one component of a multi-disciplinary discharge planning process, led by the attending physician.  Recommendations may be updated based on patient status, additional functional criteria and insurance authorization.  Follow Up Recommendations       Assistance Recommended at Discharge PRN  Patient can return home with the following     Equipment Recommendations  None recommended by PT    Recommendations for Other Services       Precautions / Restrictions Precautions Precautions: None Restrictions Weight Bearing Restrictions: No     Mobility  Bed Mobility Overal bed mobility: Modified Independent             General bed mobility comments: No assist needed.     Transfers Overall transfer level: Modified independent Equipment used: None Transfers: Sit to/from Stand Sit to Stand: Modified independent (Device/Increase time)           General transfer comment: Stood from EOB wtihout difficulty, no assist needed.    Ambulation/Gait Ambulation/Gait assistance: Modified independent (Device/Increase time) Gait Distance (Feet): 250 Feet Assistive device: None Gait Pattern/deviations: Step-through pattern, Decreased stride length, Wide base of support, Antalgic   Gait velocity interpretation: 1.31 - 2.62 ft/sec, indicative of limited community ambulator   General Gait Details: Steady antalgic gait with no evidence of imbalance, tolerating higher level balance challenges.   Stairs             Wheelchair Mobility    Modified Rankin (Stroke Patients Only)       Balance Overall balance assessment: Needs assistance Sitting-balance support: Feet supported, No upper extremity supported Sitting balance-Leahy Scale: Good     Standing balance support: During functional activity Standing balance-Leahy Scale: Fair Standing balance comment: Mod I for mobility             High level balance activites: Backward walking, Direction changes, Turns, Head turns High Level Balance Comments: Tolerated above with no evidence of imbalance or difficulty            Cognition Arousal/Alertness: Awake/alert Behavior During Therapy: WFL for tasks assessed/performed Overall Cognitive Status: Within Functional Limits for tasks assessed  Exercises      General Comments General comments (skin integrity, edema, etc.): VSS on RA.      Pertinent Vitals/Pain Pain Assessment Pain Assessment: No/denies pain    Home Living                          Prior Function            PT Goals (current goals can now be found in the care plan section) Progress towards PT goals:  Goals met/education completed, patient discharged from PT    Frequency    Min 3X/week      PT Plan Discharge plan needs to be updated    Co-evaluation              AM-PAC PT "6 Clicks" Mobility   Outcome Measure  Help needed turning from your back to your side while in a flat bed without using bedrails?: None Help needed moving from lying on your back to sitting on the side of a flat bed without using bedrails?: None Help needed moving to and from a bed to a chair (including a wheelchair)?: None Help needed standing up from a chair using your arms (e.g., wheelchair or bedside chair)?: None Help needed to walk in hospital room?: None Help needed climbing 3-5 steps with a railing? : A Little 6 Click Score: 23    End of Session   Activity Tolerance: Patient tolerated treatment well Patient left: in bed;with call bell/phone within reach Nurse Communication: Mobility status PT Visit Diagnosis: Unsteadiness on feet (R26.81);Muscle weakness (generalized) (M62.81);Difficulty in walking, not elsewhere classified (R26.2)     Time: [id number] PT Time Calculation (min) (ACUTE ONLY): 12 min  Charges:  $Therapeutic Exercise: 8-22 mins                     Marisa Severin, PT, DPT Acute Rehabilitation Services Secure chat preferred Office [phone]      Marguarite Arbour A Sabra Heck 06/17/2022, 12:24 PM

## 2022-06-17 NOTE — Plan of Care (Signed)
  Problem: Skin Integrity: Goal: Skin integrity will improve Outcome: Progressing   Problem: Education: Goal: Ability to describe self-care measures that may prevent or decrease complications (Diabetes Survival Skills Education) will improve Outcome: Progressing

## 2022-06-17 NOTE — Care Management Important Message (Signed)
Important Message  Patient Details  Name: Joshua [name] MRN: [id number] Date of Birth: 05/18/1955   Medicare Important Message Given:  Yes     Alvaro Aungst [address] Circle 06/17/2022, 3:41 PM

## 2022-06-18 DIAGNOSIS — L03115 Cellulitis of right lower limb: Secondary | ICD-10-CM | POA: Diagnosis not present

## 2022-06-18 LAB — CBC WITH DIFFERENTIAL/PLATELET
Abs Immature Granulocytes: 0.1 10*3/uL — ABNORMAL HIGH (ref 0.00–0.07)
Basophils Absolute: 0.1 10*3/uL (ref 0.0–0.1)
Basophils Relative: 1 %
Eosinophils Absolute: 0.2 10*3/uL (ref 0.0–0.5)
Eosinophils Relative: 2 %
HCT: 37.6 % — ABNORMAL LOW (ref 39.0–52.0)
Hemoglobin: 13.1 g/dL (ref 13.0–17.0)
Immature Granulocytes: 1 %
Lymphocytes Relative: 11 %
Lymphs Abs: 1.2 10*3/uL (ref 0.7–4.0)
MCH: 30.4 pg (ref 26.0–34.0)
MCHC: 34.8 g/dL (ref 30.0–36.0)
MCV: 87.2 fL (ref 80.0–100.0)
Monocytes Absolute: 0.9 10*3/uL (ref 0.1–1.0)
Monocytes Relative: 8 %
Neutro Abs: 8.7 10*3/uL — ABNORMAL HIGH (ref 1.7–7.7)
Neutrophils Relative %: 77 %
Platelets: 234 10*3/uL (ref 150–400)
RBC: 4.31 MIL/uL (ref 4.22–5.81)
RDW: 15.1 % (ref 11.5–15.5)
WBC: 11.3 10*3/uL — ABNORMAL HIGH (ref 4.0–10.5)
nRBC: 0 % (ref 0.0–0.2)

## 2022-06-18 LAB — BASIC METABOLIC PANEL
Anion gap: 8 (ref 5–15)
BUN: 13 mg/dL (ref 8–23)
CO2: 21 mmol/L — ABNORMAL LOW (ref 22–32)
Calcium: 8.3 mg/dL — ABNORMAL LOW (ref 8.9–10.3)
Chloride: 107 mmol/L (ref 98–111)
Creatinine, Ser: 0.92 mg/dL (ref 0.61–1.24)
GFR, Estimated: >60 mL/min (ref >60)
Glucose, Bld: 130 mg/dL — ABNORMAL HIGH (ref 70–99)
Potassium: 4.4 mmol/L (ref 3.5–5.1)
Sodium: 136 mmol/L (ref 135–145)

## 2022-06-18 LAB — PROCALCITONIN: Procalcitonin: 0.3 ng/mL

## 2022-06-18 LAB — PHOSPHORUS: Phosphorus: 3 mg/dL (ref 2.5–4.6)

## 2022-06-18 NOTE — Progress Notes (Signed)
PROGRESS NOTE                                                                                                                                                                                                             Patient Demographics:    Joshua [name], is a 67 y.o. male, DOB - [birth year], [id number]  Outpatient Primary MD for the patient is Sandi Mariscal, MD    LOS - 6  Admit date - 06/12/2022    Chief Complaint  Patient presents with   Sent by PCP   Leg Pain       Brief Narrative (HPI from H&P)    67 y.o. male with medical history significant of PAD, Status post right femoral to above-the-knee popliteal bypass with vein by Dr. Trula Slade done in January 2023, right common femoral endarterectomy by Dr. Trula Slade done in October 2022, COPD, DM2, GERD, gout, HLD, HTN. Afib not on anticoagulation due to hx of GI bleed who presented to the hospital with 3-day history of right lower extremity redness, was seen in vascular surgery office where he was diagnosed with severe right lower extremity cellulitis and admitted to the hospital.   Subjective:   Patient in bed, appears comfortable, denies any headache, no fever, no chest pain or pressure, no shortness of breath , no abdominal pain. No focal weakness.   Assessment  & Plan :    Severe right lower extremity cellulitis.  Thankfully his ABIs and lower extremity venous duplex are nonacute, seen by vascular surgery Case discussed with vascular surgery on 06/13/2022, he is on empiric IV antibiotics after which she already feels better, continue to monitor cultures and inflammatory markers, MRSA nasal PCR is negative.  She had vascular surgery follow-up, clinically significantly improved will give antibiotics for 2 more days thereafter 7 more days of oral antibiotic on 06/20/2022 and discharged home  PAD s/p right femoropopliteal breast bypass and right common femoral endarterectomy both  over 1 year ago.  No acute issues on exam, continue aspirin and statin for secondary prevention, vascular surgery following.  ABIs noted along with lower extremity venous duplex.  Both appear nonacute.  CAD in native artery, occluded Lcx and rotational atherectomy to LAD with DES 05/17/15 - no acute issues, chest pain-free, continue aspirin, Crestor 40 mg daily and Zetia 10 mg a day continue Coreg 12.5  mg twice daily.   History of paroxysmal atrial fibrillation.  Mali vas 2 score of greater than 3.  Continue Coreg, not on anticoagulation due to history of GI bleed.  Hypertension.  Coreg, poor control added Norvasc scheduled along with as needed hydralazine.  COPD.  No acute issues.  Supportive care.  Dyslipidemia.  On statin along with Zetia.  GERD and history of GI bleed.  On PPI.  Hypokalemia, hyponatremia and AKI.  Improving with hydration, electrolytes replaced.  Smoking.  Counseled to quit.  NicoDerm patch applied.  DM type II.  Hold p.o. medications.  Sliding scale.  Lab Results  Component Value Date   HGBA1C 6.2 (H) 06/12/2022   CBG (last 3)  No results for input(s): "GLUCAP" in the last 72 hours.       Condition - Fair  Family Communication  : Present  Code Status : Full code  Consults  : Vascular surgery  PUD Prophylaxis : PPI   Procedures  :     Bilateral lower extremity venous duplex.  No DVT.  Bilateral lower extremity ABI.  Normal on the right side, severe PAD on the left.      Disposition Plan  :    Status is: Inpatient  DVT Prophylaxis  :  Lovenox  enoxaparin (LOVENOX) injection 40 mg Start: 06/16/22 1045 SCDs Start: 06/12/22 2154   Lab Results  Component Value Date   PLT 279 06/17/2022    Diet :  Diet Order             Diet heart healthy/carb modified Room service appropriate? Yes; Fluid consistency: Thin  Diet effective now                    Inpatient Medications  Scheduled Meds:  amLODipine  10 mg Oral Daily   aspirin EC   81 mg Oral Daily   carvedilol  12.5 mg Oral BID   colchicine  0.6 mg Oral Daily   enoxaparin (LOVENOX) injection  40 mg Subcutaneous Q24H   ezetimibe  10 mg Oral QPM   fluticasone furoate-vilanterol  1 puff Inhalation Daily   And   umeclidinium bromide  1 puff Inhalation Daily   multivitamin with minerals  1 tablet Oral Daily   nicotine  21 mg Transdermal Daily   pantoprazole  40 mg Oral Daily   pregabalin  100 mg Oral TID   Ensure Max Protein  11 oz Oral Daily   rosuvastatin  40 mg Oral QPM   senna-docusate  2 tablet Oral BID   traZODone  100 mg Oral QHS   Continuous Infusions:  ceFEPime (MAXIPIME) IV 2 g (06/18/22 0152)   methocarbamol (ROBAXIN) IV Stopped (06/14/22 0121)   vancomycin 1,500 mg (06/17/22 1623)   PRN Meds:.acetaminophen **OR** acetaminophen, albuterol, fentaNYL (SUBLIMAZE) injection, hydrALAZINE, HYDROcodone-acetaminophen, methocarbamol (ROBAXIN) IV, ondansetron **OR** ondansetron (ZOFRAN) IV, oxyCODONE-acetaminophen, polyethylene glycol    Objective:   Vitals:   06/18/22 0230 06/18/22 0457 06/18/22 0816 06/18/22 0820  BP: (!) 171/70 (!) 155/74 (!) 160/65   Pulse: (!) 55  63 63  Resp: 18  18 18   Temp: 98.1 F (36.7 C)  98.5 F (36.9 C)   TempSrc: Oral  Oral   SpO2: 96%  98% 98%  Weight:      Height:        Wt Readings from Last 3 Encounters:  06/12/22 107.7 kg  06/12/22 107 kg  12/13/21 111.3 kg     Intake/Output Summary (Last 24 hours)  at 06/18/2022 0853 Last data filed at 06/18/2022 0817 Gross per 24 hour  Intake 856.38 ml  Output 1465 ml  Net -608.62 ml     Physical Exam  Awake Alert, No new F.N deficits, Normal affect [place].AT,PERRAL Supple Neck, No JVD,   Symmetrical Chest wall movement, Good air movement bilaterally, CTAB RRR,No Gallops,Rubs or new Murmurs,  +ve B.Sounds, Abd Soft, No tenderness,   R leg thigh to toes red and warm, redness and warmth much improved, still has right foot edema which he claims is chronic, overall  improving.  Picture from 06/12/22      Data Review:    Recent Labs  Lab 06/12/22 1347 06/13/22 0422 06/14/22 0553 06/15/22 0843 06/16/22 0539 06/17/22 0722  WBC 16.8* 14.7* 11.8* 10.5 11.4* 10.9*  HGB 14.2 13.0 13.2 13.9 14.3 14.5  HCT 41.8 38.5* 38.0* 39.9 41.1 43.3  PLT 158 150 149* 215 226 279  MCV 89.3 89.7 89.0 86.4 86.0 89.8  MCH 30.3 30.3 30.9 30.1 29.9 30.1  MCHC 34.0 33.8 34.7 34.8 34.8 33.5  RDW 15.8* 15.9* 15.9* 15.4 15.1 15.0  LYMPHSABS 1.2  --  1.6 1.4 1.9 1.6  MONOABS 1.2*  --  1.1* 0.9 1.0 0.7  EOSABS 0.1  --  0.3 0.3 0.4 0.4  BASOSABS 0.0  --  0.1 0.1 0.1 0.1    Recent Labs  Lab 06/12/22 1345 06/12/22 1347 06/12/22 1347 06/12/22 1620 06/12/22 1951 06/12/22 1955 06/12/22 2327 06/13/22 0422 06/14/22 0553 06/15/22 0843 06/16/22 0539 06/17/22 0722  NA  --  132*   < >  --   --  135  --  137 138 136 138 137  K  --  3.9   < >  --   --  3.4*  --  3.8 4.6 3.3* 4.2 3.4*  CL  --  96*   < >  --   --  97*  --  102 104 100 105 100  CO2  --  25   < >  --   --  25  --  25 23 22 22 25   ANIONGAP  --  11   < >  --   --  13  --  10 11 14 11 12   GLUCOSE  --  127*   < >  --   --  93  --  99 104* 122* 101* 115*  BUN  --  29*   < >  --   --  24*  --  22 16 11 11 11   CREATININE  --  1.56*   < >  --   --  1.32*  --  1.27* 1.07 1.03 1.04 1.10  AST  --  21  --   --   --   --   --  18  --   --   --   --   ALT  --  11  --   --   --   --   --  12  --   --   --   --   ALKPHOS  --  137*  --   --   --   --   --  129*  --   --   --   --   BILITOT  --  0.8  --   --   --   --   --  0.7  --   --   --   --   ALBUMIN  --  2.3*  --   --   --   --   --  2.0*  --   --   --   --   CRP  --   --   --   --   --   --   --  21.1* 16.2* 8.5* 4.7* 2.9*  PROCALCITON  --   --   --   --   --   --   --  3.33 1.86 1.01 0.66 0.46  LATICACIDVEN 1.1  --   --  1.4  --   --   --   --   --   --   --   --   INR  --   --   --   --   --  0.9  --   --   --   --   --   --   TSH  --   --   --   --  1.901   --   --   --   --   --   --   --   HGBA1C  --   --   --   --   --   --  6.2*  --   --   --   --   --   BNP  --   --   --   --   --   --   --  347.9* 466.5* 308.9* 209.8* 238.7*  MG  --   --    < >  --   --  1.8  --  2.0 2.0 1.7 2.0 1.7  CALCIUM  --  8.3*   < >  --   --  7.8*  --  8.1* 8.4* 8.6* 8.4* 8.7*   < > = values in this interval not displayed.    Radiology Reports No results found.    Signature  -   Lala Lund M.D on 06/18/2022 at 8:53 AM   -  To page go to [email or web address]

## 2022-06-18 NOTE — Progress Notes (Signed)
Mobility Specialist - Progress Note   06/18/22 1334  Mobility  Activity Refused mobility   Pt refused mobility stating that he has been up walking today independently and that he was signed off by PT. MS explained why I was checking up on him. Pt still refused. Will continue to follow throughout stay.  Franki Monte  Mobility Specialist Please contact via Solicitor or Rehab office at [phone]

## 2022-06-19 DIAGNOSIS — L03115 Cellulitis of right lower limb: Secondary | ICD-10-CM | POA: Diagnosis not present

## 2022-06-19 LAB — CBC WITH DIFFERENTIAL/PLATELET
Abs Immature Granulocytes: 0.06 10*3/uL (ref 0.00–0.07)
Basophils Absolute: 0.1 10*3/uL (ref 0.0–0.1)
Basophils Relative: 1 %
Eosinophils Absolute: 0.2 10*3/uL (ref 0.0–0.5)
Eosinophils Relative: 2 %
HCT: 36.8 % — ABNORMAL LOW (ref 39.0–52.0)
Hemoglobin: 12.5 g/dL — ABNORMAL LOW (ref 13.0–17.0)
Immature Granulocytes: 1 %
Lymphocytes Relative: 14 %
Lymphs Abs: 1.4 10*3/uL (ref 0.7–4.0)
MCH: 30 pg (ref 26.0–34.0)
MCHC: 34 g/dL (ref 30.0–36.0)
MCV: 88.2 fL (ref 80.0–100.0)
Monocytes Absolute: 0.6 10*3/uL (ref 0.1–1.0)
Monocytes Relative: 6 %
Neutro Abs: 7.8 10*3/uL — ABNORMAL HIGH (ref 1.7–7.7)
Neutrophils Relative %: 76 %
Platelets: 246 10*3/uL (ref 150–400)
RBC: 4.17 MIL/uL — ABNORMAL LOW (ref 4.22–5.81)
RDW: 15.6 % — ABNORMAL HIGH (ref 11.5–15.5)
WBC: 10.2 10*3/uL (ref 4.0–10.5)
nRBC: 0 % (ref 0.0–0.2)

## 2022-06-19 LAB — VANCOMYCIN, RANDOM: Vancomycin Rm: 15 ug/mL

## 2022-06-19 LAB — BASIC METABOLIC PANEL
Anion gap: 10 (ref 5–15)
BUN: 15 mg/dL (ref 8–23)
CO2: 22 mmol/L (ref 22–32)
Calcium: 8.3 mg/dL — ABNORMAL LOW (ref 8.9–10.3)
Chloride: 105 mmol/L (ref 98–111)
Creatinine, Ser: 1 mg/dL (ref 0.61–1.24)
GFR, Estimated: >60 mL/min (ref >60)
Glucose, Bld: 147 mg/dL — ABNORMAL HIGH (ref 70–99)
Potassium: 3.9 mmol/L (ref 3.5–5.1)
Sodium: 137 mmol/L (ref 135–145)

## 2022-06-19 LAB — MAGNESIUM: Magnesium: 1.8 mg/dL (ref 1.7–2.4)

## 2022-06-19 NOTE — Progress Notes (Signed)
PROGRESS NOTE                                                                                                                                                                                                             Patient Demographics:    Joshua [name], is a 67 y.o. male, DOB - 01-27-1956, [id number]  Outpatient Primary MD for the patient is Sandi Mariscal, MD    LOS - 7  Admit date - 06/12/2022    Chief Complaint  Patient presents with   Sent by PCP   Leg Pain       Brief Narrative (HPI from H&P)    67 y.o. male with medical history significant of PAD, Status post right femoral to above-the-knee popliteal bypass with vein by Dr. Trula Slade done in January 2023, right common femoral endarterectomy by Dr. Trula Slade done in October 2022, COPD, DM2, GERD, gout, HLD, HTN. Afib not on anticoagulation due to hx of GI bleed who presented to the hospital with 3-day history of right lower extremity redness, was seen in vascular surgery office where he was diagnosed with severe right lower extremity cellulitis and admitted to the hospital.   Subjective:   Denies any fever or chills, reports he is feeling better today.   Assessment  & Plan :    Severe right lower extremity cellulitis.   Thankfully his ABIs and lower extremity venous duplex are nonacute, seen by vascular surgery Case discussed with vascular surgery on 06/13/2022, he is on empiric IV antibiotics after which she already feels better, continue to monitor cultures and inflammatory markers, MRSA nasal PCR is negative.  She had vascular surgery follow-up, continues to improve, will continue with IV antibiotics for next 24 hours and transition to oral antibiotics upon discharge tomorrow.    PAD s/p right femoropopliteal bypass and right common femoral endarterectomy both over 1 year ago.  No acute issues on exam, continue aspirin and statin for secondary prevention, vascular  surgery following.  ABIs noted along with lower extremity venous duplex.  Both appear nonacute.  CAD in native artery, occluded Lcx and rotational atherectomy to LAD with DES 05/17/15 - no acute issues, chest pain-free, continue aspirin, Crestor 40 mg daily and Zetia 10 mg a day continue Coreg 12.5 mg twice daily.   History of paroxysmal atrial fibrillation.  Mali vas 2 score  of greater than 3.  Continue Coreg, not on anticoagulation due to history of GI bleed.  Hypertension.  Coreg, poor control added Norvasc scheduled along with as needed hydralazine.  COPD.  No acute issues.  Supportive care.  Dyslipidemia.  On statin along with Zetia.  GERD and history of GI bleed.  On PPI.  Hypokalemia, hyponatremia and AKI.  Improving with hydration, electrolytes replaced.  Smoking.  Counseled to quit.  NicoDerm patch applied.  DM type II.  Hold p.o. medications.  Sliding scale.  Lab Results  Component Value Date   HGBA1C 6.2 (H) 06/12/2022   CBG (last 3)  No results for input(s): "GLUCAP" in the last 72 hours.       Condition - Fair  Family Communication  : Present  Code Status : Full code  Consults  : Vascular surgery  PUD Prophylaxis : PPI   Procedures  :     Bilateral lower extremity venous duplex.  No DVT.  Bilateral lower extremity ABI.  Normal on the right side, severe PAD on the left.      Disposition Plan  :    Status is: Inpatient  DVT Prophylaxis  :  Lovenox  enoxaparin (LOVENOX) injection 40 mg Start: 06/16/22 1045 SCDs Start: 06/12/22 2154   Lab Results  Component Value Date   PLT 246 06/19/2022    Diet :  Diet Order             Diet heart healthy/carb modified Room service appropriate? Yes; Fluid consistency: Thin  Diet effective now                    Inpatient Medications  Scheduled Meds:  amLODipine  10 mg Oral Daily   aspirin EC  81 mg Oral Daily   carvedilol  12.5 mg Oral BID   colchicine  0.6 mg Oral Daily   enoxaparin  (LOVENOX) injection  40 mg Subcutaneous Q24H   ezetimibe  10 mg Oral QPM   fluticasone furoate-vilanterol  1 puff Inhalation Daily   And   umeclidinium bromide  1 puff Inhalation Daily   multivitamin with minerals  1 tablet Oral Daily   nicotine  21 mg Transdermal Daily   pantoprazole  40 mg Oral Daily   pregabalin  100 mg Oral TID   Ensure Max Protein  11 oz Oral Daily   rosuvastatin  40 mg Oral QPM   senna-docusate  2 tablet Oral BID   traZODone  100 mg Oral QHS   Continuous Infusions:  ceFEPime (MAXIPIME) IV 2 g (06/19/22 0827)   methocarbamol (ROBAXIN) IV Stopped (06/14/22 0121)   vancomycin Stopped (06/18/22 2029)   PRN Meds:.acetaminophen **OR** acetaminophen, albuterol, fentaNYL (SUBLIMAZE) injection, hydrALAZINE, HYDROcodone-acetaminophen, methocarbamol (ROBAXIN) IV, ondansetron **OR** ondansetron (ZOFRAN) IV, oxyCODONE-acetaminophen, polyethylene glycol    Objective:   Vitals:   06/19/22 0801 06/19/22 0820 06/19/22 0909 06/19/22 1121  BP:  (!) 149/65 (!) 147/61 (!) 140/77  Pulse:  61 (!) 57 (!) 59  Resp:   18 20  Temp:   97.8 F (36.6 C) 98 F (36.7 C)  TempSrc:   Oral Oral  SpO2: 96%  97% 96%  Weight:      Height:        Wt Readings from Last 3 Encounters:  06/12/22 107.7 kg  06/12/22 107 kg  12/13/21 111.3 kg     Intake/Output Summary (Last 24 hours) at 06/19/2022 1229 Last data filed at 06/19/2022 1100 Gross per 24 hour  Intake  720 ml  Output 1951 ml  Net -1231 ml     Physical Exam  Awake Alert, Oriented X 3, No new F.N deficits, Normal affect Symmetrical Chest wall movement, Good air movement bilaterally, CTAB RRR,No Gallops,Rubs or new Murmurs, No Parasternal Heave +ve B.Sounds, Abd Soft, No tenderness, No rebound - guarding or rigidity. Right lower extremity erythema appears to be improving, but remains with significant swelling, which is improving as well.      Data Review:    Recent Labs  Lab 06/15/22 0843 06/16/22 0539 06/17/22 0722  06/18/22 0936 06/19/22 0903  WBC 10.5 11.4* 10.9* 11.3* 10.2  HGB 13.9 14.3 14.5 13.1 12.5*  HCT 39.9 41.1 43.3 37.6* 36.8*  PLT 215 226 279 234 246  MCV 86.4 86.0 89.8 87.2 88.2  MCH 30.1 29.9 30.1 30.4 30.0  MCHC 34.8 34.8 33.5 34.8 34.0  RDW 15.4 15.1 15.0 15.1 15.6*  LYMPHSABS 1.4 1.9 1.6 1.2 1.4  MONOABS 0.9 1.0 0.7 0.9 0.6  EOSABS 0.3 0.4 0.4 0.2 0.2  BASOSABS 0.1 0.1 0.1 0.1 0.1    Recent Labs  Lab 06/12/22 1345 06/12/22 1347 06/12/22 1347 06/12/22 1620 06/12/22 1951 06/12/22 1955 06/12/22 1955 06/12/22 2327 06/13/22 0422 06/14/22 0553 06/15/22 0843 06/16/22 0539 06/17/22 0722 06/18/22 0936 06/19/22 0903  NA  --  132*   < >  --   --  135  --   --  137 138 136 138 137 136 137  K  --  3.9   < >  --   --  3.4*  --   --  3.8 4.6 3.3* 4.2 3.4* 4.4 3.9  CL  --  96*   < >  --   --  97*  --   --  102 104 100 105 100 107 105  CO2  --  25   < >  --   --  25  --   --  25 23 22 22 25  21* 22  ANIONGAP  --  11   < >  --   --  13  --   --  10 11 14 11 12 8 10   GLUCOSE  --  127*   < >  --   --  93  --   --  99 104* 122* 101* 115* 130* 147*  BUN  --  29*   < >  --   --  24*  --   --  22 16 11 11 11 13 15   CREATININE  --  1.56*   < >  --   --  1.32*  --   --  1.27* 1.07 1.03 1.04 1.10 0.92 1.00  AST  --  21  --   --   --   --   --   --  18  --   --   --   --   --   --   ALT  --  11  --   --   --   --   --   --  12  --   --   --   --   --   --   ALKPHOS  --  137*  --   --   --   --   --   --  129*  --   --   --   --   --   --   BILITOT  --  0.8  --   --   --   --   --   --  0.7  --   --   --   --   --   --   ALBUMIN  --  2.3*  --   --   --   --   --   --  2.0*  --   --   --   --   --   --   CRP  --   --   --   --   --   --   --   --  21.1* 16.2* 8.5* 4.7* 2.9*  --   --   PROCALCITON  --   --   --   --   --   --    < >  --  3.33 1.86 1.01 0.66 0.46 0.30  --   LATICACIDVEN 1.1  --   --  1.4  --   --   --   --   --   --   --   --   --   --   --   INR  --   --   --   --   --  0.9   --   --   --   --   --   --   --   --   --   TSH  --   --   --   --  1.901  --   --   --   --   --   --   --   --   --   --   HGBA1C  --   --   --   --   --   --   --  6.2*  --   --   --   --   --   --   --   BNP  --   --   --   --   --   --   --   --  347.9* 466.5* 308.9* 209.8* 238.7*  --   --   MG  --   --    < >  --   --  1.8  --   --  2.0 2.0 1.7 2.0 1.7  --  1.8  CALCIUM  --  8.3*   < >  --   --  7.8*  --   --  8.1* 8.4* 8.6* 8.4* 8.7* 8.3* 8.3*   < > = values in this interval not displayed.    Radiology Reports No results found.    Signature  -   Phillips Climes M.D on 06/19/2022 at 12:29 PM   -  To page go to [email or web address]

## 2022-06-20 ENCOUNTER — Other Ambulatory Visit (HOSPITAL_COMMUNITY): Payer: Self-pay

## 2022-06-20 DIAGNOSIS — L03115 Cellulitis of right lower limb: Secondary | ICD-10-CM | POA: Diagnosis not present

## 2022-06-20 MED ORDER — AMLODIPINE BESYLATE 10 MG PO TABS
10.0000 mg | ORAL_TABLET | Freq: Every day | ORAL | 0 refills | Status: DC
  Filled 2022-06-20: qty 30, 30d supply, fill #0

## 2022-06-20 MED ORDER — DOXYCYCLINE HYCLATE 100 MG PO TABS
100.0000 mg | ORAL_TABLET | Freq: Two times a day (BID) | ORAL | 0 refills | Status: AC
  Filled 2022-06-20: qty 12, 6d supply, fill #0

## 2022-06-20 MED ORDER — CEPHALEXIN 500 MG PO CAPS
500.0000 mg | ORAL_CAPSULE | Freq: Four times a day (QID) | ORAL | 0 refills | Status: AC
  Filled 2022-06-20: qty 24, 6d supply, fill #0

## 2022-06-20 MED ORDER — ACIDOPHILUS PO CAPS
2.0000 | ORAL_CAPSULE | Freq: Every day | ORAL | 0 refills | Status: DC
  Filled 2022-06-20: qty 20, 10d supply, fill #0

## 2022-06-20 NOTE — Discharge Instructions (Signed)
Follow with Primary MD Sandi Mariscal, MD in 7 days   Get CBC, CMP,  checked  by Primary MD next visit.    Activity: As tolerated with Full fall precautions use walker/cane & assistance as needed   Disposition Home    Diet: Heart Healthy   On your next visit with your primary care physician please Get Medicines reviewed and adjusted.   Please request your Prim.MD to go over all Hospital Tests and Procedure/Radiological results at the follow up, please get all Hospital records sent to your Prim MD by signing hospital release before you go home.   If you experience worsening of your admission symptoms, develop shortness of breath, life threatening emergency, suicidal or homicidal thoughts you must seek medical attention immediately by calling 911 or calling your MD immediately  if symptoms less severe.  You Must read complete instructions/literature along with all the possible adverse reactions/side effects for all the Medicines you take and that have been prescribed to you. Take any new Medicines after you have completely understood and accpet all the possible adverse reactions/side effects.   Do not drive, operating heavy machinery, perform activities at heights, swimming or participation in water activities or provide baby sitting services if your were admitted for syncope or siezures until you have seen by Primary MD or a Neurologist and advised to do so again.  Do not drive when taking Pain medications.    Do not take more than prescribed Pain, Sleep and Anxiety Medications  Special Instructions: If you have smoked or chewed Tobacco  in the last 2 yrs please stop smoking, stop any regular Alcohol  and or any Recreational drug use.  Wear Seat belts while driving.   Please note  You were cared for by a hospitalist during your hospital stay. If you have any questions about your discharge medications or the care you received while you were in the hospital after you are discharged, you  can call the unit and asked to speak with the hospitalist on call if the hospitalist that took care of you is not available. Once you are discharged, your primary care physician will handle any further medical issues. Please note that NO REFILLS for any discharge medications will be authorized once you are discharged, as it is imperative that you return to your primary care physician (or establish a relationship with a primary care physician if you do not have one) for your aftercare needs so that they can reassess your need for medications and monitor your lab values.

## 2022-06-20 NOTE — Plan of Care (Signed)
Patient is discharging home today.

## 2022-06-20 NOTE — Discharge Summary (Signed)
Physician Discharge Summary  Joshua [name] [id number] DOB: 12/23/1955 DOA: 06/12/2022  PCP: Sandi Mariscal, MD  Admit date: 06/12/2022 Discharge date: 06/20/2022   Recommendations for Outpatient Follow-up:  Follow up with PCP in 1-2 weeks Please obtain BMP/CBC in one week   Diet recommendation: Heart Healthy   Brief/Interim Summary:   Severe right lower extremity cellulitis.   Thankfully his ABIs and lower extremity venous duplex are nonacute, seen by vascular surgery Case discussed with vascular surgery on 06/13/2022, he is on empiric IV antibiotics after which he already feels better, cultures remain negative, MRSA nasal PCR is negative as well, improved on IV antibiotic, but required prolonged course given his severe cellulitis, he will be discharged today on p.o. Keflex and doxycycline for next 6 days for total of 14 days treatment.    PAD s/p right femoropopliteal bypass and right common femoral endarterectomy both over 1 year ago.  No acute issues on exam, continue aspirin and statin for secondary prevention, vascular surgery following.  ABIs noted along with lower extremity venous duplex.  Both appear nonacute.   CAD in native artery, occluded Lcx and rotational atherectomy to LAD with DES 05/17/15 - no acute issues, chest pain-free, continue aspirin, Crestor 40 mg daily and Zetia 10 mg a day continue Coreg 12.5 mg twice daily.   History of paroxysmal atrial fibrillation.  Mali vas 2 score of greater than 3.  Continue Coreg, not on anticoagulation due to history of GI bleed.on aspirin.   Hypertension.  Coreg, poor control added Norvasc scheduled .[name]   COPD.  No acute issues.  Supportive care.   Dyslipidemia.  On statin along with Zetia.   GERD and history of GI bleed.  On PPI.   Hypokalemia, hyponatremia and AKI.  Improving with hydration, electrolytes replaced.   Smoking.  Counseled to quit.  NicoDerm patch applied during hospital stay   DM type II.  He was not exercising  scale during hospital stay, CBG well-controlled overall.   Discharge Diagnoses:  Principal Problem:   Cellulitis Active Problems:   Essential hypertension   Moderate COPD (chronic obstructive pulmonary disease)   Peripheral vascular disease   GERD (gastroesophageal reflux disease)   Type 2 diabetes mellitus with peripheral neuropathy   CAD in native artery, occluded Lcx and rotational atherectomy to LAD with DES 05/17/15   HLD (hyperlipidemia)   Tobacco abuse   Paroxysmal atrial fibrillation   AKI (acute kidney injury)   Hypokalemia   Hyponatremia    Discharge Instructions  Discharge Instructions     Diet - low sodium heart healthy   Complete by: As directed    Discharge instructions   Complete by: As directed    Follow with Primary MD Sandi Mariscal, MD in 7 days   Get CBC, CMP,  checked  by Primary MD next visit.    Activity: As tolerated with Full fall precautions use walker/cane & assistance as needed   Disposition Home    Diet: Heart Healthy   On your next visit with your primary care physician please Get Medicines reviewed and adjusted.   Please request your Prim.MD to go over all Hospital Tests and Procedure/Radiological results at the follow up, please get all Hospital records sent to your Prim MD by signing hospital release before you go home.   If you experience worsening of your admission symptoms, develop shortness of breath, life threatening emergency, suicidal or homicidal thoughts you must seek medical attention immediately by calling 911 or calling your MD immediately  if symptoms less severe.  You Must read complete instructions/literature along with all the possible adverse reactions/side effects for all the Medicines you take and that have been prescribed to you. Take any new Medicines after you have completely understood and accpet all the possible adverse reactions/side effects.   Do not drive, operating heavy machinery, perform activities at heights,  swimming or participation in water activities or provide baby sitting services if your were admitted for syncope or siezures until you have seen by Primary MD or a Neurologist and advised to do so again.  Do not drive when taking Pain medications.    Do not take more than prescribed Pain, Sleep and Anxiety Medications  Special Instructions: If you have smoked or chewed Tobacco  in the last 2 yrs please stop smoking, stop any regular Alcohol  and or any Recreational drug use.  Wear Seat belts while driving.   Please note  You were cared for by a hospitalist during your hospital stay. If you have any questions about your discharge medications or the care you received while you were in the hospital after you are discharged, you can call the unit and asked to speak with the hospitalist on call if the hospitalist that took care of you is not available. Once you are discharged, your primary care physician will handle any further medical issues. Please note that NO REFILLS for any discharge medications will be authorized once you are discharged, as it is imperative that you return to your primary care physician (or establish a relationship with a primary care physician if you do not have one) for your aftercare needs so that they can reassess your need for medications and monitor your lab values.   Increase activity slowly   Complete by: As directed    No wound care   Complete by: As directed       Allergies as of 06/20/2022       Reactions   Zestril [lisinopril] Hives, Itching   Contrast Media [iodinated Contrast Media] Hives, Itching   OK if pt has Benadryl pre-med.   Elavil [amitriptyline] Nausea Only, Other (See Comments)   Makes pt "feel weird"   Gadolinium Derivatives Other (See Comments)   Unknown reaction   Motrin [ibuprofen] Other (See Comments)   Internal bleeding        Medication List     STOP taking these medications    DULoxetine 60 MG capsule Commonly known as:  Cymbalta   oxyCODONE-acetaminophen 5-325 MG tablet Commonly known as: PERCOCET/ROXICET       TAKE these medications    Acidophilus Caps capsule Take 2 capsules by mouth daily.   albuterol (2.5 MG/3ML) 0.083% nebulizer solution Commonly known as: PROVENTIL Take 3 mLs (2.5 mg total) by nebulization every 6 (six) hours as needed for wheezing or shortness of breath. What changed: Another medication with the same name was changed. Make sure you understand how and when to take each.   albuterol 108 (90 Base) MCG/ACT inhaler Commonly known as: ProAir HFA USE 2 PUFFS EVERY 6 HOURS  AS NEEDED FOR WHEEZING OR  SHORTNESS OF BREATH What changed:  how much to take how to take this when to take this reasons to take this additional instructions   amLODipine 10 MG tablet Commonly known as: NORVASC Take 1 tablet (10 mg total) by mouth daily. Start taking on: June 21, 2022   aspirin EC 81 MG tablet Take 81 mg by mouth daily.   carvedilol 12.5 MG tablet  Commonly known as: COREG TAKE 1 TABLET BY MOUTH  TWICE DAILY WITH MEALS What changed: when to take this   cephALEXin 500 MG capsule Commonly known as: KEFLEX Take 1 capsule (500 mg total) by mouth 4 (four) times daily for 6 days.   colchicine 0.6 MG tablet TAKE 2 TABLETS BY MOUTH AS  DIRECTED THEN 1 TABLET  AFTER 1 HOUR. CAN TAKE 1  TABLET TWICE DAILY  THEREAFTER.   doxycycline 100 MG tablet Commonly known as: VIBRA-TABS Take 1 tablet (100 mg total) by mouth 2 (two) times daily for 6 days.   ezetimibe 10 MG tablet Commonly known as: ZETIA TAKE 1 TABLET BY MOUTH DAILY What changed: when to take this   HYDROcodone-acetaminophen 5-325 MG tablet Commonly known as: NORCO/VICODIN Take 1 tablet by mouth every 4 (four) hours as needed for severe pain.   omeprazole 40 MG capsule Commonly known as: PRILOSEC TAKE 1 CAPSULE BY MOUTH DAILY   pregabalin 100 MG capsule Commonly known as: LYRICA Take 100 mg by mouth 3 (three) times  daily.   rosuvastatin 40 MG tablet Commonly known as: CRESTOR Take 1 tablet (40 mg total) by mouth daily. Pt. Will need to make an appointment in order to receive refills. What changed:  when to take this additional instructions   traZODone 100 MG tablet Commonly known as: DESYREL TAKE 1 TABLET BY MOUTH AT  BEDTIME   Trelegy Ellipta 200-62.5-25 MCG/ACT Aepb Generic drug: Fluticasone-Umeclidin-Vilant Inhale 1 puff into the lungs every evening.   Trelegy Ellipta 100-62.5-25 MCG/ACT Aepb Generic drug: Fluticasone-Umeclidin-Vilant Inhale 1 puff into the lungs daily.   VITAMIN D-3 PO Take 1 capsule by mouth daily.        Allergies  Allergen Reactions   Zestril [Lisinopril] Hives and Itching   Contrast Media [Iodinated Contrast Media] Hives and Itching    OK if pt has Benadryl pre-med.   Elavil [Amitriptyline] Nausea Only and Other (See Comments)    Makes pt "feel weird"   Gadolinium Derivatives Other (See Comments)    Unknown reaction   Motrin [Ibuprofen] Other (See Comments)    Internal bleeding    Consultations: None   Procedures/Studies: VAS [place] LOWER EXTREMITY ARTERIAL DUPLEX  Result Date: 06/13/2022 LOWER EXTREMITY ARTERIAL DUPLEX STUDY Patient Name:  Joshua [name]  Date of Exam:   06/12/2022 Medical Rec #: [id number]           Accession #:    [id number] Date of Birth: 1956-02-28           Patient Gender: M Patient Age:   10 years Exam Location:  [facility] Procedure:      VAS [place] LOWER EXTREMITY ARTERIAL DUPLEX Referring Phys: Harold Barban --------------------------------------------------------------------------------  Indications: New sudden pain, redness and heat of the right leg.  Vascular Interventions: 04/04/2021                         Pre-operative Diagnosis: Severe right leg claudication                         Post-operative diagnosis: Same                         Surgeon: [name]                         Assistants: Laurence Slate  Procedure: #1: Right femoral to above-knee popliteal                         bypass graft with 9 reversed ipsilateral saphenous vein                         #2: Redo right femoral artery exposure. Current ABI:            R=0.96, L=0.43 Performing Technologist: Ronal Fear RVS, RCS  Examination Guidelines: A complete evaluation includes B-mode imaging, spectral Doppler, color Doppler, and power Doppler as needed of all accessible portions of each vessel. Bilateral testing is considered an integral part of a complete examination. Limited examinations for reoccurring indications may be performed as noted.   Right Graft #1: femoropopliteal +------------------+--------+--------+----------+--------+                   PSV cm/sStenosisWaveform  Comments +------------------+--------+--------+----------+--------+ Inflow            65              biphasic           +------------------+--------+--------+----------+--------+ Prox Anastomosis  60              monophasic         +------------------+--------+--------+----------+--------+ Proximal Graft    57              monophasic         +------------------+--------+--------+----------+--------+ Mid Graft         124             biphasic           +------------------+--------+--------+----------+--------+ Distal Graft      92              monophasic         +------------------+--------+--------+----------+--------+ Distal Anastomosis101             monophasic         +------------------+--------+--------+----------+--------+ Outflow           126             monophasic         +------------------+--------+--------+----------+--------+   +----------+--------+-----+---------------+----------+--------+ LEFT      PSV cm/sRatioStenosis       Waveform  Comments +----------+--------+-----+---------------+----------+--------+ CFA Distal415          75-99% stenosismonophasic          +----------+--------+-----+---------------+----------+--------+ DFA       127                                            +----------+--------+-----+---------------+----------+--------+ SFA Prox  288          50-74% stenosismonophasic         +----------+--------+-----+---------------+----------+--------+ SFA Mid   26                          monophasic         +----------+--------+-----+---------------+----------+--------+ SFA Distal39                          monophasic         +----------+--------+-----+---------------+----------+--------+ POP Prox  27  monophasic         +----------+--------+-----+---------------+----------+--------+ POP Distal23                          monophasic         +----------+--------+-----+---------------+----------+--------+ ATA Distal24                          monophasic         +----------+--------+-----+---------------+----------+--------+ PTA Distal33                          monophasic         +----------+--------+-----+---------------+----------+--------+  Summary: Right: Widely patent bypass without stenosis. No evidence of DVT with limited scan. Left: 75-99% stenosis noted in the common femoral artery. 50-74% stenosis noted in the superficial femoral artery.  See table(s) above for measurements and observations. Electronically signed by Deitra Mayo MD on 06/13/2022 at 8:33:09 AM.    Final    VAS [place] ABI WITH/WO TBI  Result Date: 06/13/2022  LOWER EXTREMITY DOPPLER STUDY Patient Name:  Joshua [name]  Date of Exam:   06/12/2022 Medical Rec #: [id number]           Accession #:    [id number] Date of Birth: 01/21/1956           Patient Gender: M Patient Age:   9 years Exam Location:  [facility] Procedure:      VAS [place] ABI WITH/WO TBI Referring Phys: --------------------------------------------------------------------------------  Indications: Patient called triage  complaining of extreme right lower extremity              pain, redness and swelling 3 days. Patient was scheduled for his 6              month follow up studies.  Vascular Interventions: 04/04/2021                         Pre-operative Diagnosis: Severe right leg claudication                         Post-operative diagnosis: Same                         Surgeon: [name]                         Assistants: Laurence Slate                         Procedure: #1: Right femoral to above-knee popliteal                         bypass graft with 9 reversed ipsilateral saphenous vein                         #2: Redo right femoral artery exposure. Performing Technologist: Ronal Fear RVS, RCS  Examination Guidelines: A complete evaluation includes at minimum, Doppler waveform signals and systolic blood pressure reading at the level of bilateral brachial, anterior tibial, and posterior tibial arteries, when vessel segments are accessible. Bilateral testing is considered an integral part of a complete examination. Photoelectric Plethysmograph (PPG) waveforms and toe systolic pressure readings are included as required and additional duplex testing  as needed. Limited examinations for reoccurring indications may be performed as noted.  ABI Findings: +---------+------------------+-----+----------+--------+ Right    Rt Pressure (mmHg)IndexWaveform  Comment  +---------+------------------+-----+----------+--------+ Brachial 163                                       +---------+------------------+-----+----------+--------+ PTA      150               0.91 monophasic         +---------+------------------+-----+----------+--------+ DP       157               0.96 biphasic           +---------+------------------+-----+----------+--------+ Great Toe122               0.74                    +---------+------------------+-----+----------+--------+ +---------+------------------+-----+----------+-------+  Left     Lt Pressure (mmHg)IndexWaveform  Comment +---------+------------------+-----+----------+-------+ Brachial 164                                      +---------+------------------+-----+----------+-------+ PTA      70                0.43 monophasic        +---------+------------------+-----+----------+-------+ DP       71                0.43 monophasic        +---------+------------------+-----+----------+-------+ Great Toe65                0.40                   +---------+------------------+-----+----------+-------+ +-------+-----------+-----------+------------+------------+ ABI/TBIToday's ABIToday's TBIPrevious ABIPrevious TBI +-------+-----------+-----------+------------+------------+ Right  0.96       0.74       1.03        0.63         +-------+-----------+-----------+------------+------------+ Left   0.43       0.40       0.60        0.53         +-------+-----------+-----------+------------+------------+  Right ABIs appear essentially unchanged. Left ABIs appear decreased compared to prior study on 12/13/2021.  Summary: Right: Resting right ankle-brachial index is within normal range. The right toe-brachial index is normal. Left: Resting left ankle-brachial index indicates severe left lower extremity arterial disease. The left toe-brachial index is abnormal. *See table(s) above for measurements and observations.  Electronically signed by Deitra Mayo MD on 06/13/2022 at 8:28:59 AM.    Final    DG CHEST PORT 1 VIEW  Result Date: 06/12/2022 CLINICAL DATA:  Hyponatremia EXAM: PORTABLE CHEST 1 VIEW COMPARISON:  Previous studies including the examination of 11/14/2020 FINDINGS: Cardiac size is within normal limits. There are no signs of pulmonary edema. There is linear bandlike density in the right parahilar region with no significant change suggesting possible scarring from previous intervention. There are no new infiltrates. There is no pleural  effusion or pneumothorax. There is surgical fusion in cervical spine. IMPRESSION: Linear density in right parahilar region has not changed significantly suggesting scarring from previous intervention. There are no new infiltrates or signs of pulmonary edema. There is no pleural effusion or pneumothorax. Electronically Signed   By: Royston Cowper  Rathinasamy M.D.   On: 06/12/2022 20:19   DG Femur Min 2 Views Right  Result Date: 06/12/2022 CLINICAL DATA:  Right leg pain for several days, no known injury, initial encounter EXAM: RIGHT FEMUR 2 VIEWS COMPARISON:  None Available. FINDINGS: Degenerative changes of the right hip joint are noted. Diffuse vascular calcifications are seen. No acute fracture or dislocation is noted. No soft tissue changes are seen. IMPRESSION: No acute abnormality noted. Electronically Signed   By: Inez Catalina M.D.   On: 06/12/2022 19:11   DG Tibia/Fibula Right  Result Date: 06/12/2022 CLINICAL DATA:  Right leg pain, swelling, and redness. Chills, weight loss and pain. EXAM: RIGHT TIBIA AND FIBULA - 2 VIEW COMPARISON:  11/15/2020 FINDINGS: Diffuse soft tissue swelling and edema throughout the visualized right lower extremity. No radiopaque soft tissue foreign bodies or soft tissue gas collections. Surgical clips medial to the right knee. Vascular calcifications. Bones appear intact. No evidence of acute fracture or dislocation of the right tibia or fibula. No focal bone lesion or bone destruction. IMPRESSION: Diffuse soft tissue swelling.  No acute bony abnormalities. Electronically Signed   By: Lucienne Capers M.D.   On: 06/12/2022 19:05   VAS [place] LOWER EXTREMITY VENOUS (DVT) (ONLY [facility] & [facility])  Result Date: 06/12/2022  Lower Venous DVT Study Patient Name:  Joshua [name]  Date of Exam:   06/12/2022 Medical Rec #: [id number]           Accession #:    [id number] Date of Birth: 1955/03/29           Patient Gender: M Patient Age:   67 years Exam Location:  [facility] Procedure:       VAS [place] LOWER EXTREMITY VENOUS (DVT) Referring Phys: Herbie Baltimore PATERSON --------------------------------------------------------------------------------  Indications: Edema, and Pain.  Comparison Study: previous study 12/17/16 negative. Performing Technologist: McKayla Maag RVT, VT  Examination Guidelines: A complete evaluation includes B-mode imaging, spectral Doppler, color Doppler, and power Doppler as needed of all accessible portions of each vessel. Bilateral testing is considered an integral part of a complete examination. Limited examinations for reoccurring indications may be performed as noted. The reflux portion of the exam is performed with the patient in reverse Trendelenburg.  +--------+---------------+---------+-----------+----------+--------------------+ RIGHT   CompressibilityPhasicitySpontaneityPropertiesThrombus Aging       +--------+---------------+---------+-----------+----------+--------------------+ CFV     Full                                                              +--------+---------------+---------+-----------+----------+--------------------+ SFJ     Full                                                              +--------+---------------+---------+-----------+----------+--------------------+ FV Prox Full                                                              +--------+---------------+---------+-----------+----------+--------------------+ FV Mid  Full                                                              +--------+---------------+---------+-----------+----------+--------------------+ FV                                                   patent by color      Distal                                               doppler              +--------+---------------+---------+-----------+----------+--------------------+ PFV     Full                                                               +--------+---------------+---------+-----------+----------+--------------------+ POP     Full                                                              +--------+---------------+---------+-----------+----------+--------------------+ PTV     Full                                         Not well visualized  +--------+---------------+---------+-----------+----------+--------------------+ PERO    Full           Yes      Yes                  Not well visualized. +--------+---------------+---------+-----------+----------+--------------------+   +----+---------------+---------+-----------+----------+--------------+ LEFTCompressibilityPhasicitySpontaneityPropertiesThrombus Aging +----+---------------+---------+-----------+----------+--------------+ CFV Full           Yes      Yes                                 +----+---------------+---------+-----------+----------+--------------+ SFJ Full                                                        +----+---------------+---------+-----------+----------+--------------+     Summary: RIGHT: - There is no evidence of deep vein thrombosis in the lower extremity. However, portions of this examination were limited- see technologist comments above.  - No cystic structure found in the popliteal fossa. - Ultrasound characteristics of enlarged lymph nodes are noted in the groin.  LEFT: - No evidence of common femoral vein obstruction.  *See table(s) above for measurements and  observations. Electronically signed by Deitra Mayo MD on 06/12/2022 at 7:05:14 PM.    Final       Subjective: No significant events overnight, he denies any complaints today, he is eager to go home.  Discharge Exam: Vitals:   06/20/22 0339 06/20/22 0834  BP: (!) 159/68   Pulse: (!) 54   Resp: 17   Temp: 98 F (36.7 C)   SpO2: 96% 96%   Vitals:   06/19/22 1951 06/19/22 2335 06/20/22 0339 06/20/22 0834  BP: (!) 145/70 (!) 149/68 (!) 159/68   Pulse:  60 60 (!) 54   Resp: 19 18 17    Temp: 98 F (36.7 C) 98.1 F (36.7 C) 98 F (36.7 C)   TempSrc: Oral Oral Oral   SpO2: 97% 97% 96% 96%  Weight:      Height:        General: Pt is alert, awake, not in acute distress Cardiovascular: RRR, S1/S2 +, no rubs, no gallops Respiratory: CTA bilaterally, no wheezing, no rhonchi Abdominal: Soft, NT, ND, bowel sounds + Extremities: Right lower extremity erythema, and edema much improved.    The results of significant diagnostics from this hospitalization (including imaging, microbiology, ancillary and laboratory) are listed below for reference.     Microbiology: Recent Results (from the past 240 hour(s))  Blood culture (routine x 2)     Status: None   Collection Time: 06/12/22  4:20 PM   Specimen: BLOOD  Result Value Ref Range Status   Specimen Description BLOOD RIGHT ANTECUBITAL  Final   Special Requests   Final    BOTTLES DRAWN AEROBIC AND ANAEROBIC Blood Culture adequate volume   Culture   Final    NO GROWTH 5 DAYS Performed at [facility], [address], [place], [place] [address]    Report Status 06/17/2022 FINAL  Final  Blood culture (routine x 2)     Status: None   Collection Time: 06/12/22  4:40 PM   Specimen: BLOOD  Result Value Ref Range Status   Specimen Description BLOOD LEFT ANTECUBITAL  Final   Special Requests   Final    BOTTLES DRAWN AEROBIC AND ANAEROBIC Blood Culture adequate volume   Culture   Final    NO GROWTH 5 DAYS Performed at [facility], [place] [address] [place], [place] [address]    Report Status 06/17/2022 FINAL  Final  MRSA Next Gen by PCR, Nasal     Status: None   Collection Time: 06/14/22  4:28 PM   Specimen: Nasal Mucosa; Nasal Swab  Result Value Ref Range Status   MRSA by PCR Next Gen NOT DETECTED NOT DETECTED Final    Comment: (NOTE) The GeneXpert MRSA Assay (FDA approved for NASAL specimens only), is one component of a comprehensive MRSA colonization surveillance program. It  is not intended to diagnose MRSA infection nor to guide or monitor treatment for MRSA infections. Test performance is not FDA approved in patients less than 61 years old. Performed at [facility], [place] [address], [place], [place] [address]      Labs: BNP (last 3 results) Recent Labs    06/15/22 0843 06/16/22 0539 06/17/22 0722  BNP 308.9* 209.8* [id number]*   Basic Metabolic Panel: Recent Labs  Lab 06/14/22 0553 06/15/22 0843 06/16/22 0539 06/17/22 0722 06/18/22 0936 06/19/22 0903  NA 138 136 138 137 136 137  K 4.6 3.3* 4.2 3.4* 4.4 3.9  CL 104 100 105 100 107 105  CO2 23  22 22 25  21* 22  GLUCOSE 104* 122* 101* 115* 130* 147*  BUN 16 11 11 11 13 15   CREATININE 1.07 1.03 1.04 1.10 0.92 1.00  CALCIUM 8.4* 8.6* 8.4* 8.7* 8.3* 8.3*  MG 2.0 1.7 2.0 1.7  --  1.8  PHOS  --   --   --   --  3.0  --    Liver Function Tests: No results for input(s): "AST", "ALT", "ALKPHOS", "BILITOT", "PROT", "ALBUMIN" in the last 168 hours. No results for input(s): "LIPASE", "AMYLASE" in the last 168 hours. No results for input(s): "AMMONIA" in the last 168 hours. CBC: Recent Labs  Lab 06/15/22 0843 06/16/22 0539 06/17/22 0722 06/18/22 0936 06/19/22 0903  WBC 10.5 11.4* 10.9* 11.3* 10.2  NEUTROABS 7.7 8.0* 8.0* 8.7* 7.8*  HGB 13.9 14.3 14.5 13.1 12.5*  HCT 39.9 41.1 43.3 37.6* 36.8*  MCV 86.4 86.0 89.8 87.2 88.2  PLT 215 226 279 234 246   Cardiac Enzymes: No results for input(s): "CKTOTAL", "CKMB", "CKMBINDEX", "TROPONINI" in the last 168 hours. BNP: Invalid input(s): "POCBNP" CBG: Recent Labs  Lab 06/13/22 2124 06/14/22 0505 06/14/22 0838 06/14/22 1139 06/14/22 1607  GLUCAP 97 106* 130* 134* 108*   D-Dimer No results for input(s): "DDIMER" in the last 72 hours. Hgb A1c No results for input(s): "HGBA1C" in the last 72 hours. Lipid Profile No results for input(s): "CHOL", "HDL", "LDLCALC", "TRIG", "CHOLHDL", "LDLDIRECT" in the last 72 hours. Thyroid function  studies No results for input(s): "TSH", "T4TOTAL", "T3FREE", "THYROIDAB" in the last 72 hours.  Invalid input(s): "FREET3" Anemia work up No results for input(s): "VITAMINB12", "FOLATE", "FERRITIN", "TIBC", "IRON", "RETICCTPCT" in the last 72 hours. Urinalysis    Component Value Date/Time   COLORURINE YELLOW 06/12/2022 1642   APPEARANCEUR HAZY (A) 06/12/2022 1642   LABSPEC 1.015 06/12/2022 1642   PHURINE 5.0 06/12/2022 1642   GLUCOSEU NEGATIVE 06/12/2022 1642   HGBUR SMALL (A) 06/12/2022 1642   BILIRUBINUR NEGATIVE 06/12/2022 1642   BILIRUBINUR Negative 10/13/2019 1445   KETONESUR NEGATIVE 06/12/2022 1642   PROTEINUR >=300 (A) 06/12/2022 1642   UROBILINOGEN 1.0 10/13/2019 1445   NITRITE NEGATIVE 06/12/2022 1642   LEUKOCYTESUR NEGATIVE 06/12/2022 1642   Sepsis Labs Recent Labs  Lab 06/16/22 0539 06/17/22 0722 06/18/22 0936 06/19/22 0903  WBC 11.4* 10.9* 11.3* 10.2   Microbiology Recent Results (from the past 240 hour(s))  Blood culture (routine x 2)     Status: None   Collection Time: 06/12/22  4:20 PM   Specimen: BLOOD  Result Value Ref Range Status   Specimen Description BLOOD RIGHT ANTECUBITAL  Final   Special Requests   Final    BOTTLES DRAWN AEROBIC AND ANAEROBIC Blood Culture adequate volume   Culture   Final    NO GROWTH 5 DAYS Performed at [facility], [place] [address] [place], [place] [address]    Report Status 06/17/2022 FINAL  Final  Blood culture (routine x 2)     Status: None   Collection Time: 06/12/22  4:40 PM   Specimen: BLOOD  Result Value Ref Range Status   Specimen Description BLOOD LEFT ANTECUBITAL  Final   Special Requests   Final    BOTTLES DRAWN AEROBIC AND ANAEROBIC Blood Culture adequate volume   Culture   Final    NO GROWTH 5 DAYS Performed at [facility], [place] [address] [place], [place] [address]    Report Status 06/17/2022 FINAL  Final  MRSA Next Gen by PCR, Nasal  Status: None   Collection Time: 06/14/22  4:28 PM    Specimen: Nasal Mucosa; Nasal Swab  Result Value Ref Range Status   MRSA by PCR Next Gen NOT DETECTED NOT DETECTED Final    Comment: (NOTE) The GeneXpert MRSA Assay (FDA approved for NASAL specimens only), is one component of a comprehensive MRSA colonization surveillance program. It is not intended to diagnose MRSA infection nor to guide or monitor treatment for MRSA infections. Test performance is not FDA approved in patients less than 49 years old. Performed at [facility], [place] [address] [place], [place] [address]      Time coordinating discharge: Over 30 minutes  SIGNED:   Phillips Climes, MD  [facility] 06/20/2022, 9:04 AM Pager   If 7PM-7AM, please contact night-coverage [email or web address]

## 2022-06-20 NOTE — TOC Transition Note (Signed)
Transition of Care [facility]) - CM/SW Discharge Note   Patient Details  Name: Joshua [name] MRN: [id number] Date of Birth: May 06, 1955  Transition of Care [facility]) CM/SW Contact:  Levonne Lapping, RN Phone Number: 06/20/2022, 9:08 AM   Clinical Narrative:     Patient to DC to Home Today.  Patient will follow up as directed on AVS . No additional TOC needs identified  Wife to transport       Patient Goals and CMS Choice      Discharge Placement                         Discharge Plan and Services Additional resources added to the After Visit Summary for                                       Social Determinants of Health (SDOH) Interventions SDOH Screenings   Food Insecurity: No Food Insecurity (06/13/2022)  Housing: Low Risk  (06/13/2022)  Transportation Needs: No Transportation Needs (06/13/2022)  Utilities: Not At Risk (06/13/2022)  Depression (PHQ2-9): Low Risk  (12/07/2020)  Tobacco Use: Medium Risk (06/13/2022)     Readmission Risk Interventions    04/06/2021   10:31 AM  Readmission Risk Prevention Plan  Transportation Screening Complete  PCP or Specialist Appt within 3-5 Days Complete  HRI or [place] Complete  Social Work Consult for [place] Planning/Counseling Complete  Palliative Care Screening Not Applicable  Medication Review Press photographer) Complete

## 2022-06-27 DIAGNOSIS — L03116 Cellulitis of left lower limb: Secondary | ICD-10-CM | POA: Diagnosis not present

## 2022-06-27 DIAGNOSIS — M792 Neuralgia and neuritis, unspecified: Secondary | ICD-10-CM | POA: Diagnosis not present

## 2022-06-27 DIAGNOSIS — R7309 Other abnormal glucose: Secondary | ICD-10-CM | POA: Diagnosis not present

## 2022-06-27 DIAGNOSIS — I10 Essential (primary) hypertension: Secondary | ICD-10-CM | POA: Diagnosis not present

## 2022-06-27 DIAGNOSIS — G8929 Other chronic pain: Secondary | ICD-10-CM | POA: Diagnosis not present

## 2022-06-27 DIAGNOSIS — I739 Peripheral vascular disease, unspecified: Secondary | ICD-10-CM | POA: Diagnosis not present

## 2022-06-27 DIAGNOSIS — K219 Gastro-esophageal reflux disease without esophagitis: Secondary | ICD-10-CM | POA: Diagnosis not present

## 2022-06-27 DIAGNOSIS — J449 Chronic obstructive pulmonary disease, unspecified: Secondary | ICD-10-CM | POA: Diagnosis not present

## 2022-06-27 DIAGNOSIS — M545 Low back pain, unspecified: Secondary | ICD-10-CM | POA: Diagnosis not present

## 2022-06-27 DIAGNOSIS — R030 Elevated blood-pressure reading, without diagnosis of hypertension: Secondary | ICD-10-CM | POA: Diagnosis not present

## 2022-07-02 ENCOUNTER — Other Ambulatory Visit: Payer: Self-pay | Admitting: Cardiovascular Disease

## 2022-07-03 NOTE — Telephone Encounter (Signed)
Refill request

## 2022-07-04 DIAGNOSIS — K219 Gastro-esophageal reflux disease without esophagitis: Secondary | ICD-10-CM | POA: Diagnosis not present

## 2022-07-04 DIAGNOSIS — I10 Essential (primary) hypertension: Secondary | ICD-10-CM | POA: Diagnosis not present

## 2022-07-04 DIAGNOSIS — R030 Elevated blood-pressure reading, without diagnosis of hypertension: Secondary | ICD-10-CM | POA: Diagnosis not present

## 2022-07-04 DIAGNOSIS — I87321 Chronic venous hypertension (idiopathic) with inflammation of right lower extremity: Secondary | ICD-10-CM | POA: Diagnosis not present

## 2022-07-04 DIAGNOSIS — M545 Low back pain, unspecified: Secondary | ICD-10-CM | POA: Diagnosis not present

## 2022-07-04 DIAGNOSIS — L03116 Cellulitis of left lower limb: Secondary | ICD-10-CM | POA: Diagnosis not present

## 2022-07-04 DIAGNOSIS — I739 Peripheral vascular disease, unspecified: Secondary | ICD-10-CM | POA: Diagnosis not present

## 2022-07-04 DIAGNOSIS — M792 Neuralgia and neuritis, unspecified: Secondary | ICD-10-CM | POA: Diagnosis not present

## 2022-07-04 DIAGNOSIS — T82898S Other specified complication of vascular prosthetic devices, implants and grafts, sequela: Secondary | ICD-10-CM | POA: Diagnosis not present

## 2022-07-04 DIAGNOSIS — R7309 Other abnormal glucose: Secondary | ICD-10-CM | POA: Diagnosis not present

## 2022-07-04 DIAGNOSIS — G8929 Other chronic pain: Secondary | ICD-10-CM | POA: Diagnosis not present

## 2022-08-06 DIAGNOSIS — G8929 Other chronic pain: Secondary | ICD-10-CM | POA: Diagnosis not present

## 2022-08-06 DIAGNOSIS — M792 Neuralgia and neuritis, unspecified: Secondary | ICD-10-CM | POA: Diagnosis not present

## 2022-08-06 DIAGNOSIS — M545 Low back pain, unspecified: Secondary | ICD-10-CM | POA: Diagnosis not present

## 2022-08-06 DIAGNOSIS — I739 Peripheral vascular disease, unspecified: Secondary | ICD-10-CM | POA: Diagnosis not present

## 2022-08-06 DIAGNOSIS — K219 Gastro-esophageal reflux disease without esophagitis: Secondary | ICD-10-CM | POA: Diagnosis not present

## 2022-08-06 DIAGNOSIS — I10 Essential (primary) hypertension: Secondary | ICD-10-CM | POA: Diagnosis not present

## 2022-08-06 DIAGNOSIS — R030 Elevated blood-pressure reading, without diagnosis of hypertension: Secondary | ICD-10-CM | POA: Diagnosis not present

## 2022-08-06 DIAGNOSIS — R0989 Other specified symptoms and signs involving the circulatory and respiratory systems: Secondary | ICD-10-CM | POA: Diagnosis not present

## 2022-08-06 DIAGNOSIS — J449 Chronic obstructive pulmonary disease, unspecified: Secondary | ICD-10-CM | POA: Diagnosis not present

## 2022-08-06 DIAGNOSIS — R7309 Other abnormal glucose: Secondary | ICD-10-CM | POA: Diagnosis not present

## 2022-08-06 DIAGNOSIS — J018 Other acute sinusitis: Secondary | ICD-10-CM | POA: Diagnosis not present

## 2022-08-09 NOTE — Progress Notes (Signed)
Cardiology Office Note   Date:  08/13/2022   ID:  Joshua [name], DOB 1955-03-22, MRN [id number]  PCP:  Salli Real, MD  Cardiologist:  Dr. Anne Fu  No chief complaint on file.      History of Present Illness: Joshua [name] is a 67 y.o. male who is here today for a follow up visit regarding peripheral arterial disease.   He has known history of coronary artery disease status post rotational atherectomy and drug-eluting stent placement to the mid LAD. He has known history of peripheral arterial disease status post aortobifemoral bypass in 2010 at [facility], diabetes mellitus with diabetic neuropathy, hyperlipidemia and tobacco use. The patient does not have left carotid bruit but carotid Doppler showed less than 40% stenosis. He had worsening right leg claudication in 2019 and thus angiography was performed which showed widely patent aortobifemoral bypass.  There was severe calcified stenosis affecting the ostial SFA with moderate calcified disease in the mid and distal segment and three-vessel runoff below the knee.  On the left side, there was 60% ostial stenosis of the left SFA which was stable from before.  I performed orbital atherectomy and drug-coated balloon angioplasty to the ostial and proximal right SFA via the left brachial artery. He was hospitalized in August of 2022 with suspected GI bleed and was found to be in atrial fibrillation but converted to sinus rhythm.  He was felt to be not a good anticoagulation candidate.  Echocardiogram showed normal LV systolic function.  He had EGD and colonoscopy and was told about stomach ulcers and colon polyps which were removed.  He was seen in 2022 due to rest pain affecting the right lower extremity.  Angiography was performed in October 2022 which showed patent aorto bifemoral bypass.  On the right side, there was significant stenosis in the common femoral artery at the outflow of the bypass with severe calcified ostial profunda stenosis  and flush occlusion of the SFA with reconstitution distally with moderate calcified stenosis in the popliteal artery and two-vessel runoff below the knee.  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  The patient underwent right common femoral artery endarterectomy and profundoplasty with Dr. Myra Gianotti.  He continued to have severe right lower extremity claudication and ultimately underwent right femoral-popliteal bypass.  He was hospitalized in March with severe cellulitis of the right lower extremity that improved with antibiotics.  There was no evidence of DVT.  Arterial Doppler showed an ABI of 0.96 on the right and 0.43 on the left.  Duplex showed patent right femoral-popliteal bypass.  On the left, there was significant stenosis in the common femoral artery and the proximal SFA.  He has a follow-up appointment at [facility] to discuss possible surgical revascularization of the left lower extremity.  He can continues to smoke.  He reports worsening exertional dyspnea but no chest pain.   Past Medical History:  Diagnosis Date   AC (acromioclavicular) joint bone spurs, unspecified laterality 11/25/2016   Arthritis    "back" (05/16/2015)   CAD in native artery, occluded Lcx and rotational atherectomy to LAD with DES 05/17/15 05/18/2015   Cavitary lesion of lung 02/25/2018   Chronic back pain    "all my back"  Chronic bronchitis (HCC)    COPD (chronic obstructive pulmonary disease) (HCC)    Diabetes mellitus without complication (HCC)    GERD (gastroesophageal reflux disease)    H/O blood clots    "had them in my back; put filter in before one of my neck ORs"   History of gout    Hyperlipidemia    Hypertension     Neuromuscular disorder (HCC)    PAD (peripheral artery disease) (HCC)    Phlebitis    Positive TB test    Seasonal allergies    Shortness of breath    Spinal disease     Past Surgical History:  Procedure Laterality Date   ABDOMINAL AORTOGRAM W/LOWER EXTREMITY  01/07/2018   ABDOMINAL AORTOGRAM W/LOWER EXTREMITY N/A 01/07/2018   Procedure: ABDOMINAL AORTOGRAM W/LOWER EXTREMITY;  Surgeon: Iran Ouch, MD;  Location: [facility];  Service: Cardiovascular;  Laterality: N/A;   ABDOMINAL AORTOGRAM W/LOWER EXTREMITY N/A 12/27/2020   Procedure: ABDOMINAL AORTOGRAM W/LOWER EXTREMITY;  Surgeon: Iran Ouch, MD;  Location: [facility];  Service: Cardiovascular;  Laterality: N/A;   ABDOMINAL AORTOGRAM W/LOWER EXTREMITY N/A 04/03/2021   Procedure: ABDOMINAL AORTOGRAM W/LOWER EXTREMITY;  Surgeon: Nada Libman, MD;  Location: [facility];  Service: Cardiovascular;  Laterality: N/A;   ANTERIOR CERVICAL DECOMP/DISCECTOMY FUSION  ~ 2001-2009 X 3   ANTERIOR CERVICAL DECOMP/DISCECTOMY FUSION N/A 10/06/2012   Procedure: ANTERIOR CERVICAL DECOMPRESSION/DISCECTOMY FUSION 1 LEVEL;  Surgeon: Reinaldo Meeker, MD;  Location: [facility];  Service: Neurosurgery;  Laterality: N/A;  C7T1 anterior cervical decompression with fusion plating and bonegraft    BACK SURGERY     BIOPSY  11/21/2020   Procedure: BIOPSY;  Surgeon: Napoleon Form, MD;  Location: [facility];  Service: Endoscopy;;   CARDIAC CATHETERIZATION  05/16/2015   CARDIAC CATHETERIZATION N/A 05/16/2015   Procedure: Left Heart Cath and Coronary Angiography;  Surgeon: Marykay Lex, MD;  Location: [facility] INVASIVE CV LAB;  Service: Cardiovascular;  Laterality: N/A;   CARDIAC CATHETERIZATION N/A 05/16/2015   Procedure: Coronary Balloon Angioplasty;  Surgeon: Marykay Lex, MD;  Location: [facility] INVASIVE CV LAB;  Service: Cardiovascular;  Laterality: N/A;   CARDIAC CATHETERIZATION N/A 05/17/2015   Procedure: Coronary Stent  Intervention Rotoblater;  Surgeon: Peter M Swaziland, MD;  Location: [facility] INVASIVE CV LAB;  Service: Cardiovascular;  Laterality: N/A;   COLONOSCOPY WITH PROPOFOL N/A 11/21/2020   Procedure: COLONOSCOPY WITH PROPOFOL;  Surgeon: Napoleon Form, MD;  Location: [facility];  Service: Endoscopy;  Laterality: N/A;   ELBOW SURGERY Left 2016   "had bone pieces removed"   ENDARTERECTOMY FEMORAL Right 12/28/2020   Procedure: RIGHT FEMORAL ARTERY ENDARTERECTOMY;  Surgeon: Nada Libman, MD;  Location: [facility] OR;  Service: Vascular;  Laterality: Right;   ESOPHAGEAL MANOMETRY N/A 12/30/2016   Procedure: ESOPHAGEAL MANOMETRY (EM);  Surgeon: Hilarie Fredrickson, MD;  Location: [facility];  Service: Endoscopy;  Laterality: N/A;   ESOPHAGOGASTRODUODENOSCOPY (EGD) WITH PROPOFOL N/A 11/21/2020   Procedure: ESOPHAGOGASTRODUODENOSCOPY (EGD) WITH PROPOFOL;  Surgeon: Napoleon Form, MD;  Location: [facility];  Service: Endoscopy;  Laterality: N/A;   EXCISIONAL HEMORRHOIDECTOMY     FEMORAL BYPASS  ~ 2010   FEMORAL-POPLITEAL BYPASS GRAFT Right 04/04/2021   Procedure: RIGHT FEMORAL-POPLITEAL ARTERY BYPASS GRAFT;  Surgeon: Nada Libman, MD;  Location: [facility];  Service: Vascular;  Laterality: Right;   IR RADIOLOGIST EVAL & MGMT  12/17/2016   IVC FILTER PLACEMENT ([facility] HX)  2010   "  had blood clots in my back"   NECK SURGERY  2014   2008-2014   [facility] ANGIOPLASTY Right 12/28/2020   Procedure: PATCH ANGIOPLASTY USING;  Surgeon: Nada Libman, MD;  Location: [facility] OR;  Service: Vascular;  Laterality: Right;   PERIPHERAL VASCULAR ATHERECTOMY  01/07/2018   Procedure: PERIPHERAL VASCULAR ATHERECTOMY;  Surgeon: Iran Ouch, MD;  [facility];  Service: Cardiovascular;;   PERIPHERAL VASCULAR BALLOON ANGIOPLASTY  01/07/2018   Procedure: PERIPHERAL VASCULAR BALLOON ANGIOPLASTY;  Surgeon: Iran Ouch, MD;  [facility];  Service: Cardiovascular;;   PERIPHERAL VASCULAR CATHETERIZATION  N/A 02/14/2016   Procedure: Abdominal Aortogram w/Lower Extremity;  Surgeon: Iran Ouch, MD;  [facility];  Service: Cardiovascular;  Laterality: N/A;   PERIPHERAL VASCULAR INTERVENTION Right 04/03/2021   Procedure: PERIPHERAL VASCULAR INTERVENTION;  Surgeon: Nada Libman, MD;  [facility];  Service: Cardiovascular;  Laterality: Right;   POLYPECTOMY  11/21/2020   Procedure: POLYPECTOMY;  Surgeon: Napoleon Form, MD;  Location: [facility];  Service: Endoscopy;;   POSTERIOR LAMINECTOMY / DECOMPRESSION LUMBAR SPINE  2006   "bone spurs"   VIDEO BRONCHOSCOPY WITH ENDOBRONCHIAL NAVIGATION Right 02/25/2018   Procedure: VIDEO BRONCHOSCOPY WITH ENDOBRONCHIAL NAVIGATION;  Surgeon: Josephine Igo, DO;  Location: [facility] OR;  Service: Thoracic;  Laterality: Right;   VIDEO BRONCHOSCOPY WITH ENDOBRONCHIAL ULTRASOUND Right 02/25/2018   Procedure: VIDEO BRONCHOSCOPY WITH ENDOBRONCHIAL ULTRASOUND;  Surgeon: Josephine Igo, DO;  Location: [facility] OR;  Service: Thoracic;  Laterality: Right;     Current Outpatient Medications  Medication Sig Dispense Refill   albuterol (PROAIR HFA) 108 (90 Base) MCG/ACT inhaler USE 2 PUFFS EVERY 6 HOURS  AS NEEDED FOR WHEEZING OR  SHORTNESS OF BREATH (Patient taking differently: Inhale 2 puffs into the lungs every 6 (six) hours as needed for wheezing or shortness of breath.) 34 g 3   albuterol (PROVENTIL) (2.5 MG/3ML) 0.083% nebulizer solution Take 3 mLs (2.5 mg total) by nebulization every 6 (six) hours as needed for wheezing or shortness of breath. 75 mL 5   amLODipine (NORVASC) 10 MG tablet Take 1 tablet (10 mg total) by mouth daily. 30 tablet 0   amoxicillin (AMOXIL) 500 MG capsule Take 500 mg by mouth 3 (three) times daily.     aspirin EC 81 MG tablet Take 81 mg by mouth daily.     carvedilol (COREG) 12.5 MG tablet TAKE 1 TABLET BY MOUTH  TWICE DAILY WITH MEALS (Patient taking differently: Take 12.5 mg by mouth 2 (two) times daily.)  60 tablet 0   Cholecalciferol (VITAMIN D-3 PO) Take 1 capsule by mouth daily.     colchicine 0.6 MG tablet TAKE 2 TABLETS BY MOUTH AS  DIRECTED THEN 1 TABLET  AFTER 1 HOUR. CAN TAKE 1  TABLET TWICE DAILY  THEREAFTER. 60 tablet 11   ezetimibe (ZETIA) 10 MG tablet TAKE 1 TABLET BY MOUTH DAILY (Patient taking differently: Take 10 mg by mouth every evening.) 100 tablet 0   Fluticasone-Umeclidin-Vilant (TRELEGY ELLIPTA) 200-62.5-25 MCG/ACT AEPB Inhale 1 puff into the lungs every evening.     HYDROcodone-acetaminophen (NORCO/VICODIN) 5-325 MG tablet Take 1 tablet by mouth every 4 (four) hours as needed for severe pain.     Lactobacillus (ACIDOPHILUS) CAPS capsule Take 2 capsules by mouth daily. 28 capsule 0   omeprazole (PRILOSEC) 40 MG capsule TAKE 1 CAPSULE BY MOUTH DAILY (Patient taking differently: Take 40 mg by mouth daily.) 90 capsule 0  pregabalin (LYRICA) 100 MG capsule Take 100 mg by mouth 3 (three) times daily.     rosuvastatin (CRESTOR) 40 MG tablet TAKE 1 TABLET BY MOUTH DAILY 30 tablet 11   sulfamethoxazole-trimethoprim (BACTRIM DS) 800-160 MG tablet Take 1 tablet by mouth 2 (two) times daily.     traZODone (DESYREL) 100 MG tablet TAKE 1 TABLET BY MOUTH AT  BEDTIME (Patient taking differently: Take 100 mg by mouth at bedtime.) 90 tablet 2   No current facility-administered medications for this visit.    Allergies:   Zestril [lisinopril], Contrast media [iodinated contrast media], Elavil [amitriptyline], Gadolinium derivatives, and Motrin [ibuprofen]    Social History:  The patient  reports that he quit smoking about 18 months ago. His smoking use included cigarettes. He has a 24.50 pack-year smoking history. He has been exposed to tobacco smoke. He has never used smokeless tobacco. He reports current alcohol use. He reports that he does not use drugs.   Family History:  The patient's family history includes Aneurysm in his brother; Healthy in his daughter and maternal grandmother; Heart  attack in his father; Hypertension in his father and mother; Leukemia in his grandchild; Stomach cancer in his mother; Stroke in his father.    ROS:  Please see the history of present illness.   Otherwise, review of systems are positive for none.   All other systems are reviewed and negative.    PHYSICAL EXAM: VS:  BP 138/72 (BP Location: Left Arm, Patient Position: Sitting, Cuff Size: Normal)   Pulse (!) 101   Ht 5\' 9"  (1.753 m)   Wt 234 lb 9.6 oz (106.4 kg)   SpO2 94%   BMI 34.64 kg/m  , BMI Body mass index is 34.64 kg/m. GEN: Well nourished, well developed, in no acute distress  HEENT: normal  Neck: no JVD, carotid bruits, or masses Cardiac: RRR; no murmurs, rubs, or gallops,no edema  Respiratory: Bilateral rhonchi and mild expiratory wheezing. GI: soft, nontender, nondistended, + BS MS: no deformity or atrophy  Skin: warm and dry, no rash Neuro:  Strength and sensation are intact Psych: euthymic mood, full affect    EKG:  EKG is not ordered today.   Recent Labs: 06/12/2022: TSH 1.901 06/13/2022: ALT 12 06/17/2022: B Natriuretic Peptide 238.7 06/19/2022: BUN 15; Creatinine, Ser 1.00; Hemoglobin 12.5; Magnesium 1.8; Platelets 246; Potassium 3.9; Sodium 137    Lipid Panel    Component Value Date/Time   CHOL 104 04/05/2021 0320   CHOL 215 (H) 11/21/2017 0810   TRIG 58 04/05/2021 0320   HDL 59 04/05/2021 0320   HDL 126 11/21/2017 0810   CHOLHDL 1.8 04/05/2021 0320   VLDL 12 04/05/2021 0320   LDLCALC 33 04/05/2021 0320   LDLCALC 75 07/09/2019 1435   LDLDIRECT 107.0 04/21/2015 1352      Wt Readings from Last 3 Encounters:  08/13/22 234 lb 9.6 oz (106.4 kg)  06/12/22 237 lb 7 oz (107.7 kg)  06/12/22 236 lb (107 kg)         ASSESSMENT AND PLAN:  1.   Peripheral arterial disease: Status post aortobifemoral bypass, right common femoral artery endarterectomy and right femoral-popliteal bypass.  He reports significant left leg claudication and is known to have  significant disease affecting the left common femoral artery and the proximal SFA.  He has follow-up at [facility] in July to discuss surgical revascularization.  2. Coronary artery disease: Status post atherectomy and drug-eluting stent placement to the LAD.  The patient has no angina.  Continue low-dose aspirin.  3. Tobacco use: He is trying to cut down on his own.  I discussed with him the importance of smoking cessation.  4. Hyperlipidemia: Continue treatment with ezetimibe and rosuvastatin.  Most recent lipid profile showed an LDL of 33.  5.  Essential hypertension: Blood pressure is well controlled.  6.  Worsening exertional dyspnea and lower extremity edema.  I requested an echocardiogram for evaluation.    Disposition:   FU with me in 6 months.  Signed,  Lorine Bears, MD  08/13/2022 1:04 PM    [facility]

## 2022-08-13 ENCOUNTER — Ambulatory Visit: Payer: Medicare Other | Attending: Cardiovascular Disease | Admitting: Cardiovascular Disease

## 2022-08-13 ENCOUNTER — Encounter: Payer: Self-pay | Admitting: Cardiovascular Disease

## 2022-08-13 VITALS — BP 138/72 | HR 101 | Ht 69.0 in | Wt 234.6 lb

## 2022-08-13 DIAGNOSIS — R0602 Shortness of breath: Secondary | ICD-10-CM | POA: Diagnosis not present

## 2022-08-13 DIAGNOSIS — I10 Essential (primary) hypertension: Secondary | ICD-10-CM | POA: Diagnosis not present

## 2022-08-13 DIAGNOSIS — E785 Hyperlipidemia, unspecified: Secondary | ICD-10-CM | POA: Diagnosis not present

## 2022-08-13 DIAGNOSIS — I480 Paroxysmal atrial fibrillation: Secondary | ICD-10-CM

## 2022-08-13 DIAGNOSIS — I2510 Atherosclerotic heart disease of native coronary artery without angina pectoris: Secondary | ICD-10-CM | POA: Diagnosis not present

## 2022-08-13 DIAGNOSIS — Z720 Tobacco use: Secondary | ICD-10-CM | POA: Diagnosis not present

## 2022-08-13 DIAGNOSIS — I739 Peripheral vascular disease, unspecified: Secondary | ICD-10-CM | POA: Diagnosis not present

## 2022-08-13 NOTE — Patient Instructions (Signed)
Medication Instructions:  No changes *If you need a refill on your cardiac medications before your next appointment, please call your pharmacy*   Lab Work: None ordered If you have labs (blood work) drawn today and your tests are completely normal, you will receive your results only by: MyChart Message (if you have MyChart) OR A paper copy in the mail If you have any lab test that is abnormal or we need to change your treatment, we will call you to review the results.   Testing/Procedures: Your physician has requested that you have an echocardiogram. Echocardiography is a painless test that uses sound waves to create images of your heart. It provides your doctor with information about the size and shape of your heart and how well your heart's Krah and valves are working. You may receive an ultrasound enhancing agent through an IV if needed to better visualize your heart during the echo.This procedure takes approximately one hour. There are no restrictions for this procedure. This will take place at the [address].     Follow-Up: At [facility], you and your health needs are our priority.  As part of our continuing mission to provide you with exceptional heart care, we have created designated Provider Care Teams.  These Care Teams include your primary Cardiologist (physician) and Advanced Practice Providers (APPs -  Physician Assistants and Nurse Practitioners) who all work together to provide you with the care you need, when you need it.  We recommend signing up for the patient portal called "MyChart".  Sign up information is provided on this After Visit Summary.  MyChart is used to connect with patients for Virtual Visits (Telemedicine).  Patients are able to view lab/test results, encounter notes, upcoming appointments, etc.  Non-urgent messages can be sent to your provider as well.   To learn more about what you can do with MyChart, go to  [email or web address].    Your next appointment:   6 month(s)  Provider:   Dr. Kirke Corin  Managing the Challenge of Quitting Smoking Quitting smoking is a physical and mental challenge. You may have cravings, withdrawal symptoms, and temptation to smoke. Before quitting, work with your health care provider to make a plan that can help you manage quitting. Making a plan before you quit may keep you from smoking when you have the urge to smoke while trying to quit. How to manage lifestyle changes Managing stress Stress can make you want to smoke, and wanting to smoke may cause stress. It is important to find ways to manage your stress. You could try some of the following: Practice relaxation techniques. Breathe slowly and deeply, in through your nose and out through your mouth. Listen to music. Soak in a bath or take a shower. Imagine a peaceful place or vacation. Get some support. Talk with family or friends about your stress. Join a support group. Talk with a counselor or therapist. Get some physical activity. Go for a walk, run, or bike ride. Play a favorite sport. Practice yoga.  Medicines Talk with your health care provider about medicines that might help you deal with cravings and make quitting easier for you. Relationships Social situations can be difficult when you are quitting smoking. To manage this, you can: Avoid parties and other social situations where people might be smoking. Avoid alcohol. Leave right away if you have the urge to smoke. Explain to your family and friends that you are quitting smoking. Ask for support and let them  know you might be a bit grumpy. Plan activities where smoking is not an option. General instructions Be aware that many people gain weight after they quit smoking. However, not everyone does. To keep from gaining weight, have a plan in place before you quit, and stick to the plan after you quit. Your plan should include: Eating healthy  snacks. When you have a craving, it may help to: Eat popcorn, or try carrots, celery, or other cut vegetables. Chew sugar-free gum. Changing how you eat. Eat small portion sizes at meals. Eat 4-6 small meals throughout the day instead of 1-2 large meals a day. Be mindful when you eat. You should avoid watching television or doing other things that might distract you as you eat. Exercising regularly. Make time to exercise each day. If you do not have time for a long workout, do short bouts of exercise for 5-10 minutes several times a day. Do some form of strengthening exercise, such as weight lifting. Do some exercise that gets your heart beating and causes you to breathe deeply, such as walking fast, running, swimming, or biking. This is very important. Drinking plenty of water or other low-calorie or no-calorie drinks. Drink enough fluid to keep your urine pale yellow.  How to recognize withdrawal symptoms Your body and mind may experience discomfort as you try to get used to not having nicotine in your system. These effects are called withdrawal symptoms. They may include: Feeling hungrier than normal. Having trouble concentrating. Feeling irritable or restless. Having trouble sleeping. Feeling depressed. Craving a cigarette. These symptoms may surprise you, but they are normal to have when quitting smoking. To manage withdrawal symptoms: Avoid places, people, and activities that trigger your cravings. Remember why you want to quit. Get plenty of sleep. Avoid coffee and other drinks that contain caffeine. These may worsen some of your symptoms. How to manage cravings Come up with a plan for how to deal with your cravings. The plan should include the following: A definition of the specific situation you want to deal with. An activity or action you will take to replace smoking. A clear idea for how this action will help. The name of someone who could help you with this. Cravings  usually last for 5-10 minutes. Consider taking the following actions to help you with your plan to deal with cravings: Keep your mouth busy. Chew sugar-free gum. Suck on hard candies or a straw. Brush your teeth. Keep your hands and body busy. Change to a different activity right away. Squeeze or play with a ball. Do an activity or a hobby, such as making bead jewelry, practicing needlepoint, or working with wood. Mix up your normal routine. Take a short exercise break. Go for a quick walk, or run up and down stairs. Focus on doing something kind or helpful for someone else. Call a friend or family member to talk during a craving. Join a support group. Contact a quitline. Where to find support To get help or find a support group: Call the National Cancer Institute's Smoking Quitline: 1-800-QUIT-NOW 930-670-5575) Text QUIT to SmokefreeTXT: 562130 Where to find more information Visit these websites to find more information on quitting smoking: U.S. Department of Health and Human Services: www.smokefree.gov American Lung Association: www.freedomfromsmoking.org Centers for Disease Control and Prevention (CDC): FootballExhibition.com.br American Heart Association: www.heart.org Contact a health care provider if: You want to change your plan for quitting. The medicines you are taking are not helping. Your eating feels out of control or you  cannot sleep. You feel depressed or become very anxious. Summary Quitting smoking is a physical and mental challenge. You will face cravings, withdrawal symptoms, and temptation to smoke again. Preparation can help you as you go through these challenges. Try different techniques to manage stress, handle social situations, and prevent weight gain. You can deal with cravings by keeping your mouth busy (such as by chewing gum), keeping your hands and body busy, calling family or friends, or contacting a quitline for people who want to quit smoking. You can deal with  withdrawal symptoms by avoiding places where people smoke, getting plenty of rest, and avoiding drinks that contain caffeine. This information is not intended to replace advice given to you by your health care provider. Make sure you discuss any questions you have with your health care provider. Document Revised: 02/23/2021 Document Reviewed: 02/23/2021 Elsevier Patient Education  2024 [employer].

## 2022-09-09 ENCOUNTER — Other Ambulatory Visit: Payer: Self-pay | Admitting: *Deleted

## 2022-09-09 DIAGNOSIS — I739 Peripheral vascular disease, unspecified: Secondary | ICD-10-CM

## 2022-09-17 ENCOUNTER — Ambulatory Visit (HOSPITAL_COMMUNITY): Payer: Medicare Other | Attending: Cardiovascular Disease

## 2022-09-17 DIAGNOSIS — R0602 Shortness of breath: Secondary | ICD-10-CM | POA: Diagnosis present

## 2022-09-17 LAB — ECHOCARDIOGRAM COMPLETE
Area-P 1/2: 3.48 cm2
S' Lateral: 2.6 cm

## 2022-09-23 ENCOUNTER — Ambulatory Visit (INDEPENDENT_AMBULATORY_CARE_PROVIDER_SITE_OTHER)
Admission: RE | Admit: 2022-09-23 | Discharge: 2022-09-23 | Disposition: A | Discharge status: Home / Self Care | Payer: Medicare Other | Source: Ambulatory Visit | Attending: Surgery | Admitting: Surgery

## 2022-09-23 ENCOUNTER — Encounter: Payer: Self-pay | Admitting: Surgery

## 2022-09-23 ENCOUNTER — Ambulatory Visit (HOSPITAL_COMMUNITY)
Admission: RE | Admit: 2022-09-23 | Discharge: 2022-09-23 | Disposition: A | Discharge status: Home / Self Care | Payer: Medicare Other | Source: Ambulatory Visit | Attending: Surgery | Admitting: Surgery

## 2022-09-23 ENCOUNTER — Ambulatory Visit: Payer: Medicare Other | Admitting: Surgery

## 2022-09-23 VITALS — BP 141/84 | HR 47 | Temp 97.9°F | Resp 20 | Ht 69.0 in | Wt 233.0 lb

## 2022-09-23 DIAGNOSIS — I70213 Atherosclerosis of native arteries of extremities with intermittent claudication, bilateral legs: Secondary | ICD-10-CM

## 2022-09-23 DIAGNOSIS — I739 Peripheral vascular disease, unspecified: Secondary | ICD-10-CM

## 2022-09-23 DIAGNOSIS — M7989 Other specified soft tissue disorders: Secondary | ICD-10-CM

## 2022-09-23 LAB — VAS US ABI WITH/WO TBI
Left ABI: 0.5
Right ABI: 0.95

## 2022-09-23 NOTE — Progress Notes (Signed)
[facility] [place]  Patient name: JOESIAH MAPSTONE MRN: [id number] DOB: March 23, 1955 Sex: male   REASON FOR VISIT:    Follow up  HISOTRY OF PRESENT ILLNESS:    KAIYDEN AFTAB is a 67 y.o. male who is status post aortobifemoral bypass graft at [facility] many years ago for a right heel wound.  He was found on angiography and 2022 to have a high-grade profundus stenosis on the right and superficial femoral artery occlusion.  He was having rest pain and so he went to the operating room on 12/28/2020 for right iliofemoral endarterectomy with patch angioplasty.  He continued to have significant limitations in his right leg.  He had a failed attempt at angioplasty and on 04/04/2021, he underwent a right femoral to above-knee popliteal bypass graft with reverse saphenous vein  He was admitted to the hospital earlier this year with severe right leg cellulitis which has resolved.  He is now having significant left leg claudication only being able to take about 20 steps before he gets leg cramping.  He suffers from CAD, status post PCI in 2017.  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  He is a diabetic.  He is on a statin for hypercholesterolemia.   PAST MEDICAL HISTORY:   Past Medical History:  Diagnosis Date   AC (acromioclavicular) joint bone spurs, unspecified laterality 11/25/2016   Arthritis    "back" (05/16/2015)   CAD in native artery, occluded Lcx and rotational atherectomy to LAD with DES 05/17/15 05/18/2015   Cavitary lesion of lung 02/25/2018   Chronic back pain    "all my back"   Chronic bronchitis (HCC)    COPD (chronic obstructive pulmonary disease) (HCC)    Diabetes mellitus without complication (HCC)    GERD (gastroesophageal reflux disease)    H/O blood clots    "had them in my back; put filter in before one of my neck ORs"   History of gout    Hyperlipidemia    Hypertension    Neuromuscular disorder (HCC)     PAD (peripheral artery disease) (HCC)    Phlebitis    Positive TB test    Seasonal allergies    Shortness of breath    Spinal disease      FAMILY HISTORY:   Family History  Problem Relation Age of Onset   Stomach cancer Mother        Deceased, 78s   Hypertension Mother    Hypertension Father    Stroke Father    Heart attack Father        Deceased, 53   Aneurysm Brother    Healthy Daughter    Healthy Maternal Grandmother    Leukemia Grandchild    Diabetes Neg Hx    Early death Neg Hx    Colon cancer Neg Hx    Pancreatic cancer Neg Hx     SOCIAL HISTORY:   Social History   Tobacco Use   Smoking status: Former    Packs/day: 0.50    Years: 49.00    Additional pack years: 0.00    Total pack years: 24.50    Types: Cigarettes    Quit date: 02/09/2021    Years since quitting: 1.6    Passive exposure: Current (Wife is a Smoker)   Smokeless tobacco: Never   Tobacco comments:    half a pack a day   Substance Use Topics   Alcohol use: Yes    Alcohol/week: 0.0 standard drinks  of alcohol    Comment: 2 drinks a week     ALLERGIES:   Allergies  Allergen Reactions   Zestril [Lisinopril] Hives and Itching   Contrast Media [Iodinated Contrast Media] Hives and Itching    OK if pt has Benadryl pre-med.   Elavil [Amitriptyline] Nausea Only and Other (See Comments)    Makes pt "feel weird"   Gadolinium Derivatives Other (See Comments)    Unknown reaction   Motrin [Ibuprofen] Other (See Comments)    Internal bleeding     CURRENT MEDICATIONS:   Current Outpatient Medications  Medication Sig Dispense Refill   albuterol (PROAIR HFA) 108 (90 Base) MCG/ACT inhaler USE 2 PUFFS EVERY 6 HOURS  AS NEEDED FOR WHEEZING OR  SHORTNESS OF BREATH (Patient taking differently: Inhale 2 puffs into the lungs every 6 (six) hours as needed for wheezing or shortness of breath.) 34 g 3   albuterol (PROVENTIL) (2.5 MG/3ML) 0.083% nebulizer solution Take 3 mLs (2.5 mg total) by nebulization  every 6 (six) hours as needed for wheezing or shortness of breath. 75 mL 5   amLODipine (NORVASC) 10 MG tablet Take 1 tablet (10 mg total) by mouth daily. 30 tablet 0   amoxicillin (AMOXIL) 500 MG capsule Take 500 mg by mouth 3 (three) times daily.     aspirin EC 81 MG tablet Take 81 mg by mouth daily.     carvedilol (COREG) 12.5 MG tablet TAKE 1 TABLET BY MOUTH  TWICE DAILY WITH MEALS (Patient taking differently: Take 12.5 mg by mouth 2 (two) times daily.) 60 tablet 0   Cholecalciferol (VITAMIN D-3 PO) Take 1 capsule by mouth daily.     colchicine 0.6 MG tablet TAKE 2 TABLETS BY MOUTH AS  DIRECTED THEN 1 TABLET  AFTER 1 HOUR. CAN TAKE 1  TABLET TWICE DAILY  THEREAFTER. 60 tablet 11   ezetimibe (ZETIA) 10 MG tablet TAKE 1 TABLET BY MOUTH DAILY (Patient taking differently: Take 10 mg by mouth every evening.) 100 tablet 0   Fluticasone-Umeclidin-Vilant (TRELEGY ELLIPTA) 200-62.5-25 MCG/ACT AEPB Inhale 1 puff into the lungs every evening.     HYDROcodone-acetaminophen (NORCO/VICODIN) 5-325 MG tablet Take 1 tablet by mouth every 4 (four) hours as needed for severe pain.     Lactobacillus (ACIDOPHILUS) CAPS capsule Take 2 capsules by mouth daily. 28 capsule 0   omeprazole (PRILOSEC) 40 MG capsule TAKE 1 CAPSULE BY MOUTH DAILY (Patient taking differently: Take 40 mg by mouth daily.) 90 capsule 0   pregabalin (LYRICA) 100 MG capsule Take 100 mg by mouth 3 (three) times daily.     rosuvastatin (CRESTOR) 40 MG tablet TAKE 1 TABLET BY MOUTH DAILY 30 tablet 11   sulfamethoxazole-trimethoprim (BACTRIM DS) 800-160 MG tablet Take 1 tablet by mouth 2 (two) times daily.     traZODone (DESYREL) 100 MG tablet TAKE 1 TABLET BY MOUTH AT  BEDTIME (Patient taking differently: Take 100 mg by mouth at bedtime.) 90 tablet 2   No current facility-administered medications for this visit.    REVIEW OF SYSTEMS:   [X]  denotes positive finding, [ ]  denotes negative finding Cardiac  Comments:  Chest pain or chest pressure:     Shortness of breath upon exertion:    Short of breath when lying flat:    Irregular heart rhythm:        Vascular    Pain in calf, thigh, or hip brought on by ambulation: x   Pain in feet at night that wakes you up from your  sleep:     Blood clot in your veins:    Leg swelling:         Pulmonary    Oxygen at home:    Productive cough:     Wheezing:         Neurologic    Sudden weakness in arms or legs:     Sudden numbness in arms or legs:     Sudden onset of difficulty speaking or slurred speech:    Temporary loss of vision in one eye:     Problems with dizziness:         Gastrointestinal    Blood in stool:     Vomited blood:         Genitourinary    Burning when urinating:     Blood in urine:        Psychiatric    Major depression:         Hematologic    Bleeding problems:    Problems with blood clotting too easily:        Skin    Rashes or ulcers:        Constitutional    Fever or chills:      PHYSICAL EXAM:   Vitals:   09/23/22 1003  BP: (!) 141/84  Pulse: (!) 47  Resp: 20  Temp: 97.9 F (36.6 C)  SpO2: 94%  Weight: 233 lb (105.7 kg)  Height: 5\' 9"  (1.753 m)    GENERAL: The patient is a well-nourished male, in no acute distress. The vital signs are documented above. CARDIAC: There is a regular rate and rhythm.  VASCULAR: Right leg edema.  Nonpalpable pedal pulses PULMONARY: Non-labored respirations MUSCULOSKELETAL: There are no major deformities or cyanosis. NEUROLOGIC: No focal weakness or paresthesias are detected. SKIN: There are no ulcers or rashes noted. PSYCHIATRIC: The patient has a normal affect.  STUDIES:   I have reviewed the following: ABI/TBIToday's ABIToday's TBIPrevious ABIPrevious TBI  +-------+-----------+-----------+------------+------------+  Right 0.95       0.77       0.96        0.74          +-------+-----------+-----------+------------+------------+  Left  0.50       0.29       0.43        0.40           +-------   RT Diameter  RT Findings         GSV            LT Diameter  LT  Findings      (cm)                                            (cm)                    +---------------+-----------+----------------------+---------------+-------  ----+                               Saphenofemoral         0.61  Junction                                    +---------------+-----------+----------------------+---------------+-------  ----+                               Proximal thigh         0.35                    +---------------+-----------+----------------------+---------------+-------  ----+                                 Mid thigh            0.27                    +---------------+-----------+----------------------+---------------+-------  ----+                                Distal thigh          0.29                    +---------------+-----------+----------------------+---------------+-------  ----+                                    Knee              0.30                    +---------------+-----------+----------------------+---------------+-------  ----+                                 Prox calf            0.24                    +---------------+-----------+----------------------+---------------+-------  ----+                                  Mid calf            0.22                    +---------------+-----------+----------------------+---------------+-------  ----+                                Distal calf           0.22                    +---------------+-----------+----------------------+---------------+-------  ----+   MEDICAL ISSUES:   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  He is now only able to walk about 20 steps before he has claudication symptoms.  In reviewing  his angiogram from 2 years ago, he had a high-grade proximal left SFA stenosis.  I have recommended that we repeat angiography to determine whether or not he needs a femoral-popliteal bypass graft or iliofemoral endarterectomy.  This will be scheduled for next week.  I will plan on cannulating  his right groin and getting bilateral lower extremity images.  After this, we will plan surgical intervention    Charlena Cross, MD, FACS [facility] [phone] Pager [phone]

## 2022-09-23 NOTE — H&P (View-Only) (Signed)
 [facility] [place]  Patient name: JOESIAH MAPSTONE MRN: [id number] DOB: March 23, 1955 Sex: male   REASON FOR VISIT:    Follow up  HISOTRY OF PRESENT ILLNESS:    KAIYDEN AFTAB is a 67 y.o. male who is status post aortobifemoral bypass graft at [facility] many years ago for a right heel wound.  He was found on angiography and 2022 to have a high-grade profundus stenosis on the right and superficial femoral artery occlusion.  He was having rest pain and so he went to the operating room on 12/28/2020 for right iliofemoral endarterectomy with patch angioplasty.  He continued to have significant limitations in his right leg.  He had a failed attempt at angioplasty and on 04/04/2021, he underwent a right femoral to above-knee popliteal bypass graft with reverse saphenous vein  He was admitted to the hospital earlier this year with severe right leg cellulitis which has resolved.  He is now having significant left leg claudication only being able to take about 20 steps before he gets leg cramping.  He suffers from CAD, status post PCI in 2017.  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  He is a diabetic.  He is on a statin for hypercholesterolemia.   PAST MEDICAL HISTORY:   Past Medical History:  Diagnosis Date   AC (acromioclavicular) joint bone spurs, unspecified laterality 11/25/2016   Arthritis    "back" (05/16/2015)   CAD in native artery, occluded Lcx and rotational atherectomy to LAD with DES 05/17/15 05/18/2015   Cavitary lesion of lung 02/25/2018   Chronic back pain    "all my back"   Chronic bronchitis (HCC)    COPD (chronic obstructive pulmonary disease) (HCC)    Diabetes mellitus without complication (HCC)    GERD (gastroesophageal reflux disease)    H/O blood clots    "had them in my back; put filter in before one of my neck ORs"   History of gout    Hyperlipidemia    Hypertension    Neuromuscular disorder (HCC)     PAD (peripheral artery disease) (HCC)    Phlebitis    Positive TB test    Seasonal allergies    Shortness of breath    Spinal disease      FAMILY HISTORY:   Family History  Problem Relation Age of Onset   Stomach cancer Mother        Deceased, 78s   Hypertension Mother    Hypertension Father    Stroke Father    Heart attack Father        Deceased, 53   Aneurysm Brother    Healthy Daughter    Healthy Maternal Grandmother    Leukemia Grandchild    Diabetes Neg Hx    Early death Neg Hx    Colon cancer Neg Hx    Pancreatic cancer Neg Hx     SOCIAL HISTORY:   Social History   Tobacco Use   Smoking status: Former    Packs/day: 0.50    Years: 49.00    Additional pack years: 0.00    Total pack years: 24.50    Types: Cigarettes    Quit date: 02/09/2021    Years since quitting: 1.6    Passive exposure: Current (Wife is a Smoker)   Smokeless tobacco: Never   Tobacco comments:    half a pack a day   Substance Use Topics   Alcohol use: Yes    Alcohol/week: 0.0 standard drinks  of alcohol    Comment: 2 drinks a week     ALLERGIES:   Allergies  Allergen Reactions   Zestril [Lisinopril] Hives and Itching   Contrast Media [Iodinated Contrast Media] Hives and Itching    OK if pt has Benadryl pre-med.   Elavil [Amitriptyline] Nausea Only and Other (See Comments)    Makes pt "feel weird"   Gadolinium Derivatives Other (See Comments)    Unknown reaction   Motrin [Ibuprofen] Other (See Comments)    Internal bleeding     CURRENT MEDICATIONS:   Current Outpatient Medications  Medication Sig Dispense Refill   albuterol (PROAIR HFA) 108 (90 Base) MCG/ACT inhaler USE 2 PUFFS EVERY 6 HOURS  AS NEEDED FOR WHEEZING OR  SHORTNESS OF BREATH (Patient taking differently: Inhale 2 puffs into the lungs every 6 (six) hours as needed for wheezing or shortness of breath.) 34 g 3   albuterol (PROVENTIL) (2.5 MG/3ML) 0.083% nebulizer solution Take 3 mLs (2.5 mg total) by nebulization  every 6 (six) hours as needed for wheezing or shortness of breath. 75 mL 5   amLODipine (NORVASC) 10 MG tablet Take 1 tablet (10 mg total) by mouth daily. 30 tablet 0   amoxicillin (AMOXIL) 500 MG capsule Take 500 mg by mouth 3 (three) times daily.     aspirin EC 81 MG tablet Take 81 mg by mouth daily.     carvedilol (COREG) 12.5 MG tablet TAKE 1 TABLET BY MOUTH  TWICE DAILY WITH MEALS (Patient taking differently: Take 12.5 mg by mouth 2 (two) times daily.) 60 tablet 0   Cholecalciferol (VITAMIN D-3 PO) Take 1 capsule by mouth daily.     colchicine 0.6 MG tablet TAKE 2 TABLETS BY MOUTH AS  DIRECTED THEN 1 TABLET  AFTER 1 HOUR. CAN TAKE 1  TABLET TWICE DAILY  THEREAFTER. 60 tablet 11   ezetimibe (ZETIA) 10 MG tablet TAKE 1 TABLET BY MOUTH DAILY (Patient taking differently: Take 10 mg by mouth every evening.) 100 tablet 0   Fluticasone-Umeclidin-Vilant (TRELEGY ELLIPTA) 200-62.5-25 MCG/ACT AEPB Inhale 1 puff into the lungs every evening.     HYDROcodone-acetaminophen (NORCO/VICODIN) 5-325 MG tablet Take 1 tablet by mouth every 4 (four) hours as needed for severe pain.     Lactobacillus (ACIDOPHILUS) CAPS capsule Take 2 capsules by mouth daily. 28 capsule 0   omeprazole (PRILOSEC) 40 MG capsule TAKE 1 CAPSULE BY MOUTH DAILY (Patient taking differently: Take 40 mg by mouth daily.) 90 capsule 0   pregabalin (LYRICA) 100 MG capsule Take 100 mg by mouth 3 (three) times daily.     rosuvastatin (CRESTOR) 40 MG tablet TAKE 1 TABLET BY MOUTH DAILY 30 tablet 11   sulfamethoxazole-trimethoprim (BACTRIM DS) 800-160 MG tablet Take 1 tablet by mouth 2 (two) times daily.     traZODone (DESYREL) 100 MG tablet TAKE 1 TABLET BY MOUTH AT  BEDTIME (Patient taking differently: Take 100 mg by mouth at bedtime.) 90 tablet 2   No current facility-administered medications for this visit.    REVIEW OF SYSTEMS:   [X]  denotes positive finding, [ ]  denotes negative finding Cardiac  Comments:  Chest pain or chest pressure:     Shortness of breath upon exertion:    Short of breath when lying flat:    Irregular heart rhythm:        Vascular    Pain in calf, thigh, or hip brought on by ambulation: x   Pain in feet at night that wakes you up from your  sleep:     Blood clot in your veins:    Leg swelling:         Pulmonary    Oxygen at home:    Productive cough:     Wheezing:         Neurologic    Sudden weakness in arms or legs:     Sudden numbness in arms or legs:     Sudden onset of difficulty speaking or slurred speech:    Temporary loss of vision in one eye:     Problems with dizziness:         Gastrointestinal    Blood in stool:     Vomited blood:         Genitourinary    Burning when urinating:     Blood in urine:        Psychiatric    Major depression:         Hematologic    Bleeding problems:    Problems with blood clotting too easily:        Skin    Rashes or ulcers:        Constitutional    Fever or chills:      PHYSICAL EXAM:   Vitals:   09/23/22 1003  BP: (!) 141/84  Pulse: (!) 47  Resp: 20  Temp: 97.9 F (36.6 C)  SpO2: 94%  Weight: 233 lb (105.7 kg)  Height: 5\' 9"  (1.753 m)    GENERAL: The patient is a well-nourished male, in no acute distress. The vital signs are documented above. CARDIAC: There is a regular rate and rhythm.  VASCULAR: Right leg edema.  Nonpalpable pedal pulses PULMONARY: Non-labored respirations MUSCULOSKELETAL: There are no major deformities or cyanosis. NEUROLOGIC: No focal weakness or paresthesias are detected. SKIN: There are no ulcers or rashes noted. PSYCHIATRIC: The patient has a normal affect.  STUDIES:   I have reviewed the following: ABI/TBIToday's ABIToday's TBIPrevious ABIPrevious TBI  +-------+-----------+-----------+------------+------------+  Right 0.95       0.77       0.96        0.74          +-------+-----------+-----------+------------+------------+  Left  0.50       0.29       0.43        0.40           +-------   RT Diameter  RT Findings         GSV            LT Diameter  LT  Findings      (cm)                                            (cm)                    +---------------+-----------+----------------------+---------------+-------  ----+                               Saphenofemoral         0.61  Junction                                    +---------------+-----------+----------------------+---------------+-------  ----+                               Proximal thigh         0.35                    +---------------+-----------+----------------------+---------------+-------  ----+                                 Mid thigh            0.27                    +---------------+-----------+----------------------+---------------+-------  ----+                                Distal thigh          0.29                    +---------------+-----------+----------------------+---------------+-------  ----+                                    Knee              0.30                    +---------------+-----------+----------------------+---------------+-------  ----+                                 Prox calf            0.24                    +---------------+-----------+----------------------+---------------+-------  ----+                                  Mid calf            0.22                    +---------------+-----------+----------------------+---------------+-------  ----+                                Distal calf           0.22                    +---------------+-----------+----------------------+---------------+-------  ----+   MEDICAL ISSUES:   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  He is now only able to walk about 20 steps before he has claudication symptoms.  In reviewing  his angiogram from 2 years ago, he had a high-grade proximal left SFA stenosis.  I have recommended that we repeat angiography to determine whether or not he needs a femoral-popliteal bypass graft or iliofemoral endarterectomy.  This will be scheduled for next week.  I will plan on cannulating  his right groin and getting bilateral lower extremity images.  After this, we will plan surgical intervention    Charlena Cross, MD, FACS [facility] [phone] Pager [phone]

## 2022-09-24 ENCOUNTER — Other Ambulatory Visit: Payer: Self-pay

## 2022-09-24 DIAGNOSIS — I70213 Atherosclerosis of native arteries of extremities with intermittent claudication, bilateral legs: Secondary | ICD-10-CM

## 2022-10-01 ENCOUNTER — Other Ambulatory Visit: Payer: Self-pay

## 2022-10-01 ENCOUNTER — Ambulatory Visit (HOSPITAL_COMMUNITY)
Admission: RE | Admit: 2022-10-01 | Discharge: 2022-10-01 | Disposition: A | Discharge status: Home / Self Care | Payer: Medicare Other | Attending: Surgery | Admitting: Surgery

## 2022-10-01 ENCOUNTER — Encounter (HOSPITAL_COMMUNITY)
Admission: RE | Disposition: A | Discharge status: Home / Self Care | Payer: Self-pay | Source: Home / Self Care | Attending: Surgery

## 2022-10-01 DIAGNOSIS — E1151 Type 2 diabetes mellitus with diabetic peripheral angiopathy without gangrene: Secondary | ICD-10-CM | POA: Insufficient documentation

## 2022-10-01 DIAGNOSIS — Z79899 Other long term (current) drug therapy: Secondary | ICD-10-CM | POA: Insufficient documentation

## 2022-10-01 DIAGNOSIS — Z87891 Personal history of nicotine dependence: Secondary | ICD-10-CM | POA: Diagnosis not present

## 2022-10-01 DIAGNOSIS — J449 Chronic obstructive pulmonary disease, unspecified: Secondary | ICD-10-CM | POA: Insufficient documentation

## 2022-10-01 DIAGNOSIS — E7800 Pure hypercholesterolemia, unspecified: Secondary | ICD-10-CM | POA: Insufficient documentation

## 2022-10-01 DIAGNOSIS — Z9861 Coronary angioplasty status: Secondary | ICD-10-CM | POA: Diagnosis not present

## 2022-10-01 DIAGNOSIS — I70213 Atherosclerosis of native arteries of extremities with intermittent claudication, bilateral legs: Secondary | ICD-10-CM | POA: Insufficient documentation

## 2022-10-01 DIAGNOSIS — I70212 Atherosclerosis of native arteries of extremities with intermittent claudication, left leg: Secondary | ICD-10-CM | POA: Diagnosis not present

## 2022-10-01 DIAGNOSIS — I2510 Atherosclerotic heart disease of native coronary artery without angina pectoris: Secondary | ICD-10-CM | POA: Diagnosis not present

## 2022-10-01 HISTORY — PX: ABDOMINAL AORTOGRAM W/LOWER EXTREMITY: CATH118223

## 2022-10-01 LAB — GLUCOSE, CAPILLARY: Glucose-Capillary: 107 mg/dL — ABNORMAL HIGH (ref 70–99)

## 2022-10-01 LAB — POCT I-STAT, CHEM 8
BUN: 15 mg/dL (ref 8–23)
Calcium, Ion: 1.18 mmol/L (ref 1.15–1.40)
Chloride: 102 mmol/L (ref 98–111)
Creatinine, Ser: 1.3 mg/dL — ABNORMAL HIGH (ref 0.61–1.24)
Glucose, Bld: 96 mg/dL (ref 70–99)
HCT: 43 % (ref 39.0–52.0)
Hemoglobin: 14.6 g/dL (ref 13.0–17.0)
Potassium: 5.2 mmol/L — ABNORMAL HIGH (ref 3.5–5.1)
Sodium: 140 mmol/L (ref 135–145)
TCO2: 28 mmol/L (ref 22–32)

## 2022-10-01 SURGERY — ABDOMINAL AORTOGRAM W/LOWER EXTREMITY
Anesthesia: LOCAL

## 2022-10-01 MED ORDER — LIDOCAINE HCL (PF) 1 % IJ SOLN
INTRAMUSCULAR | Status: AC
  Filled 2022-10-01: qty 30

## 2022-10-01 MED ORDER — HYDRALAZINE HCL 20 MG/ML IJ SOLN
INTRAMUSCULAR | Status: AC
  Administered 2022-10-01: 5 mg via INTRAVENOUS
  Filled 2022-10-01: qty 1

## 2022-10-01 MED ORDER — MIDAZOLAM HCL 2 MG/2ML IJ SOLN
INTRAMUSCULAR | Status: DC | PRN
  Administered 2022-10-01: 2 mg via INTRAVENOUS

## 2022-10-01 MED ORDER — ONDANSETRON HCL 4 MG/2ML IJ SOLN: 4.0000 mg | Freq: Four times a day (QID) | INTRAMUSCULAR | Status: DC | PRN

## 2022-10-01 MED ORDER — IODIXANOL 320 MG/ML IV SOLN
INTRAVENOUS | Status: DC | PRN
  Administered 2022-10-01: 79 mL

## 2022-10-01 MED ORDER — HEPARIN (PORCINE) IN NACL 1000-0.9 UT/500ML-% IV SOLN
INTRAVENOUS | Status: DC | PRN
  Administered 2022-10-01 (×2): 500 mL

## 2022-10-01 MED ORDER — HYDRALAZINE HCL 20 MG/ML IJ SOLN
5.0000 mg | INTRAMUSCULAR | Status: AC | PRN
  Administered 2022-10-01: 5 mg via INTRAVENOUS

## 2022-10-01 MED ORDER — FENTANYL CITRATE (PF) 100 MCG/2ML IJ SOLN
INTRAMUSCULAR | Status: AC
  Filled 2022-10-01: qty 2

## 2022-10-01 MED ORDER — DIPHENHYDRAMINE HCL 50 MG/ML IJ SOLN
25.0000 mg | INTRAMUSCULAR | Status: AC
  Administered 2022-10-01: 25 mg via INTRAVENOUS
  Filled 2022-10-01: qty 1

## 2022-10-01 MED ORDER — METHYLPREDNISOLONE SODIUM SUCC 125 MG IJ SOLR
125.0000 mg | INTRAMUSCULAR | Status: AC
  Administered 2022-10-01: 125 mg via INTRAVENOUS
  Filled 2022-10-01: qty 2

## 2022-10-01 MED ORDER — MIDAZOLAM HCL 2 MG/2ML IJ SOLN
INTRAMUSCULAR | Status: AC
  Filled 2022-10-01: qty 2

## 2022-10-01 MED ORDER — LIDOCAINE HCL (PF) 1 % IJ SOLN
INTRAMUSCULAR | Status: DC | PRN
  Administered 2022-10-01: 10 mL

## 2022-10-01 MED ORDER — ACETAMINOPHEN 325 MG PO TABS: 650.0000 mg | ORAL_TABLET | ORAL | Status: DC | PRN

## 2022-10-01 MED ORDER — SODIUM CHLORIDE 0.9 % WEIGHT BASED INFUSION: 1.0000 mL/kg/h | INTRAVENOUS | Status: DC

## 2022-10-01 MED ORDER — SODIUM CHLORIDE 0.9% FLUSH: 3.0000 mL | Freq: Two times a day (BID) | INTRAVENOUS | Status: DC

## 2022-10-01 MED ORDER — LABETALOL HCL 5 MG/ML IV SOLN: 10.0000 mg | INTRAVENOUS | Status: DC | PRN

## 2022-10-01 MED ORDER — SODIUM CHLORIDE 0.9 % IV SOLN: INTRAVENOUS | Status: DC

## 2022-10-01 MED ORDER — OXYCODONE HCL 5 MG PO TABS: 5.0000 mg | ORAL_TABLET | ORAL | Status: DC | PRN

## 2022-10-01 MED ORDER — FENTANYL CITRATE (PF) 100 MCG/2ML IJ SOLN
INTRAMUSCULAR | Status: DC | PRN
  Administered 2022-10-01: 50 ug via INTRAVENOUS

## 2022-10-01 MED ORDER — SODIUM CHLORIDE 0.9% FLUSH: 3.0000 mL | INTRAVENOUS | Status: DC | PRN

## 2022-10-01 MED ORDER — SODIUM CHLORIDE 0.9 % IV SOLN: 250.0000 mL | INTRAVENOUS | Status: DC | PRN

## 2022-10-01 SURGICAL SUPPLY — 14 items
CATH OMNI FLUSH 5F 65CM (CATHETERS) IMPLANT
CATH SOFTOUCH MOTARJEME 5F (CATHETERS) IMPLANT
DEVICE VASC CLSR CELT ART 5 (Vascular Products) IMPLANT
KIT MICROPUNCTURE NIT STIFF (SHEATH) IMPLANT
KIT PV (KITS) ×1 IMPLANT
SET ATX-X65L (MISCELLANEOUS) IMPLANT
SHEATH PINNACLE 5F 10CM (SHEATH) IMPLANT
SHEATH PROBE COVER 6X72 (BAG) IMPLANT
STOPCOCK MORSE 400PSI 3WAY (MISCELLANEOUS) IMPLANT
SYR MEDRAD MARK 7 150ML (SYRINGE) ×1 IMPLANT
TRANSDUCER W/STOPCOCK (MISCELLANEOUS) ×1 IMPLANT
TRAY PV CATH (CUSTOM PROCEDURE TRAY) ×1 IMPLANT
TUBING CIL FLEX 10 FLL-RA (TUBING) IMPLANT
WIRE STARTER BENTSON 035X150 (WIRE) IMPLANT

## 2022-10-01 NOTE — Op Note (Signed)
    Patient name: Joshua [name] MRN: [id number] DOB: Mar 18, 1956 Sex: male  10/01/2022 Pre-operative Diagnosis: Left leg claudication Post-operative diagnosis:  Same Surgeon:  [name] Procedure Performed:  1.  Ultrasound-guided access, right common femoral artery  2.  Abdominal aortogram  3.  Bilateral lower extremity angiogram  4.  Catheter selection and left limb of aortobifemoral graft (common iliac artery close 1)  5.  Conscious sedation: 26 minutes  6.  Closure device, Celt     Indications: This is a 67 year old gentleman with left leg claudication comes in today for angiography.  Procedure:  The patient was identified in the holding area and taken to room 8.  The patient was then placed supine on the table and prepped and draped in the usual sterile fashion.  A time out was called.  Conscious sedation was administered with the use of IV fentanyl and Versed under continuous physician and nurse monitoring.  Heart rate, blood pressure, and oxygen saturation were continuously monitored.  Total sedation time was .  Ultrasound was used to evaluate the right common femoral artery.  It was patent .  A digital ultrasound image was acquired.  A micropuncture needle was used to access the right common femoral artery under ultrasound guidance.  An 018 wire was advanced without resistance and a micropuncture sheath was placed.  The 018 wire was removed and a benson wire was placed.  The micropuncture sheath was exchanged for a 5 french sheath.  An omniflush catheter was advanced over the wire to the level of L-1.  An abdominal angiogram was obtained.  Next, a reverse curve catheter was used to select the left limb of aortobifemoral graft and left leg runoff was performed.  Right leg images were obtained and a retrograde injection Findings:   Aortogram: No renal artery stenosis.  The infrarenal aorta is widely patent.  The proximal anastomosis of the aortobifemoral graft is widely  patent.  Both limbs of the aortic graft are patent down to the common femoral arteries.  There is ectasia/aneurysmal changes in the left common femoral artery and ectasia on the right  Right Lower Extremity: The right limb of the aortic graft is anastomosed to the right common femoral artery.  The profundofemoral is widely patent.  There is a bypass graft originating from the graft to the popliteal artery which is widely patent with three-vessel runoff  Left Lower Extremity: Aneurysmal changes at the distal anastomosis of the left limb of the aortobifemoral graft.  There is a high-grade lesion in the common femoral artery at the level of the anastomosis extending into the superficial femoral artery.  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  The stenosis extends into the proximal superficial femoral artery  #3  Patent right femoral-popliteal bypass graft  #4  Patient will be scheduled for left iliofemoral endarterectomy  V. [name], M.D., [place] [facility] [place] Office: [phone] Pager:  [phone]

## 2022-10-01 NOTE — Progress Notes (Signed)
Pt ambulated to and from bathroom to void with no signs of oozing from Right groin site  

## 2022-10-01 NOTE — Interval H&P Note (Signed)
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  The various methods of treatment have been discussed with the patient and family. After consideration of risks, benefits and other options for treatment, the patient has consented to  Procedure(s): ABDOMINAL AORTOGRAM W/LOWER EXTREMITY (N/A) as a surgical intervention.  The patient's history has been reviewed, patient examined, no change in status, stable for surgery.  I have reviewed the patient's chart and labs.  Questions were answered to the patient's satisfaction.     [name]

## 2022-10-02 ENCOUNTER — Encounter (HOSPITAL_COMMUNITY): Payer: Self-pay | Admitting: Surgery

## 2022-10-07 ENCOUNTER — Other Ambulatory Visit: Payer: Self-pay

## 2022-10-07 DIAGNOSIS — I70213 Atherosclerosis of native arteries of extremities with intermittent claudication, bilateral legs: Secondary | ICD-10-CM

## 2022-10-30 NOTE — Pre-Procedure Instructions (Signed)
Surgical Instructions   Your procedure is scheduled on November 08, 2022. Report to [facility] Main Entrance "A" at 8:20 A.M., then check in with the Admitting office. Any questions or running late day of surgery: call [phone]  Questions prior to your surgery date: call [phone], Monday-Friday, 8am-4pm. If you experience any cold or flu symptoms such as cough, fever, chills, shortness of breath, etc. between now and your scheduled surgery, please notify us at the above number.     Remember:  Do not eat or drink after midnight the night before your surgery    Take these medicines the morning of surgery with A SIP OF WATER: aspirin  carvedilol (COREG)  ezetimibe (ZETIA)  Fluticasone-Umeclidin-Vilant (TRELEGY ELLIPTA) inhaler omeprazole (PRILOSEC)  pregabalin (LYRICA)  rosuvastatin (CRESTOR)    May take these medicines IF NEEDED: albuterol (PROAIR HFA) inhaler  albuterol (PROVENTIL) nebulizer solution  EPINEPHrine (PRIMATENE MIST) inhaler HYDROcodone-acetaminophen (NORCO/VICODIN)    One week prior to surgery, STOP taking any Aleve, Naproxen, Ibuprofen, Motrin, Advil, Goody's, BC's, all herbal medications, fish oil, and non-prescription vitamins.   HOW TO MANAGE YOUR DIABETES BEFORE AND AFTER SURGERY  Why is it important to control my blood sugar before and after surgery? Improving blood sugar levels before and after surgery helps healing and can limit problems. A way of improving blood sugar control is eating a healthy diet by:  Eating less sugar and carbohydrates  Increasing activity/exercise  Talking with your doctor about reaching your blood sugar goals High blood sugars (greater than 180 mg/dL) can raise your risk of infections and slow your recovery, so you will need to focus on controlling your diabetes during the weeks before surgery. Make sure that the doctor who takes care of your diabetes knows about your planned surgery including the date and  location.  How do I manage my blood sugar before surgery? Check your blood sugar at least 4 times a day, starting 2 days before surgery, to make sure that the level is not too high or low.  Check your blood sugar the morning of your surgery when you wake up and every 2 hours until you get to the Short Stay unit.  If your blood sugar is less than 70 mg/dL, you will need to treat for low blood sugar: Do not take insulin. Treat a low blood sugar (less than 70 mg/dL) with  cup of clear juice (cranberry or apple), 4 glucose tablets, OR glucose gel. Recheck blood sugar in 15 minutes after treatment (to make sure it is greater than 70 mg/dL). If your blood sugar is not greater than 70 mg/dL on recheck, call [phone] for further instructions. Report your blood sugar to the short stay nurse when you get to Short Stay.  If you are admitted to the hospital after surgery: Your blood sugar will be checked by the staff and you will probably be given insulin after surgery (instead of oral diabetes medicines) to make sure you have good blood sugar levels. The goal for blood sugar control after surgery is 80-180 mg/dL.                      Do NOT Smoke (Tobacco/Vaping) for 24 hours prior to your procedure.  If you use a CPAP at night, you may bring your mask/headgear for your overnight stay.   You will be asked to remove any contacts, glasses, piercing's, hearing aid's, dentures/partials prior to surgery. Please bring cases for these items if needed.  Patients discharged the day of surgery will not be allowed to drive home, and someone needs to stay with them for 24 hours.  SURGICAL WAITING ROOM VISITATION Patients may have no more than 2 support people in the waiting area - these visitors may rotate.   Pre-op nurse will coordinate an appropriate time for 1 ADULT support person, who may not rotate, to accompany patient in pre-op.  Children under the age of 36 must have an adult with them who is  not the patient and must remain in the main waiting area with an adult.  If the patient needs to stay at the hospital during part of their recovery, the visitor guidelines for inpatient rooms apply.  Please refer to the [facility] website for the visitor guidelines for any additional information.   If you received a COVID test during your pre-op visit  it is requested that you wear a mask when out in public, stay away from anyone that may not be feeling well and notify your surgeon if you develop symptoms. If you have been in contact with anyone that has tested positive in the last 10 days please notify you surgeon.      Pre-operative CHG Bathing Instructions   You can play a key role in reducing the risk of infection after surgery. Your skin needs to be as free of germs as possible. You can reduce the number of germs on your skin by washing with CHG (chlorhexidine gluconate) soap before surgery. CHG is an antiseptic soap that kills germs and continues to kill germs even after washing.   DO NOT use if you have an allergy to chlorhexidine/CHG or antibacterial soaps. If your skin becomes reddened or irritated, stop using the CHG and notify one of our RNs at [phone].              TAKE A SHOWER THE NIGHT BEFORE SURGERY AND THE DAY OF SURGERY    Please keep in mind the following:  DO NOT shave, including legs and underarms, 48 hours prior to surgery.   You may shave your face before/day of surgery.  Place clean sheets on your bed the night before surgery Use a clean washcloth (not used since being washed) for each shower. DO NOT sleep with pet's night before surgery.  CHG Shower Instructions:  If you choose to wash your hair and private area, wash first with your normal shampoo/soap.  After you use shampoo/soap, rinse your hair and body thoroughly to remove shampoo/soap residue.  Turn the water OFF and apply half the bottle of CHG soap to a CLEAN washcloth.  Apply CHG soap ONLY FROM  YOUR NECK DOWN TO YOUR TOES (washing for 3-5 minutes)  DO NOT use CHG soap on face, private areas, open wounds, or sores.  Pay special attention to the area where your surgery is being performed.  If you are having back surgery, having someone wash your back for you may be helpful. Wait 2 minutes after CHG soap is applied, then you may rinse off the CHG soap.  Pat dry with a clean towel  Put on clean pajamas    Additional instructions for the day of surgery: DO NOT APPLY any lotions, deodorants, cologne, or perfumes.   Do not wear jewelry or makeup Do not wear nail polish, gel polish, artificial nails, or any other type of covering on natural nails (fingers and toes) Do not bring valuables to the hospital. [facility] is not responsible for valuables/personal belongings. Put  on clean/comfortable clothes.  Please brush your teeth.  Ask your nurse before applying any prescription medications to the skin.

## 2022-10-31 ENCOUNTER — Other Ambulatory Visit: Payer: Self-pay

## 2022-10-31 ENCOUNTER — Encounter (HOSPITAL_COMMUNITY)
Admission: RE | Admit: 2022-10-31 | Discharge: 2022-10-31 | Disposition: A | Discharge status: Home / Self Care | Payer: Medicare Other | Source: Ambulatory Visit | Attending: Surgery | Admitting: Surgery

## 2022-10-31 ENCOUNTER — Encounter (HOSPITAL_COMMUNITY): Payer: Self-pay

## 2022-10-31 VITALS — BP 160/57 | HR 49 | Temp 98.4°F | Resp 18 | Ht 69.0 in | Wt 227.4 lb

## 2022-10-31 DIAGNOSIS — E785 Hyperlipidemia, unspecified: Secondary | ICD-10-CM | POA: Diagnosis not present

## 2022-10-31 DIAGNOSIS — J449 Chronic obstructive pulmonary disease, unspecified: Secondary | ICD-10-CM | POA: Diagnosis not present

## 2022-10-31 DIAGNOSIS — R7303 Prediabetes: Secondary | ICD-10-CM | POA: Insufficient documentation

## 2022-10-31 DIAGNOSIS — R0600 Dyspnea, unspecified: Secondary | ICD-10-CM | POA: Insufficient documentation

## 2022-10-31 DIAGNOSIS — E1142 Type 2 diabetes mellitus with diabetic polyneuropathy: Secondary | ICD-10-CM | POA: Diagnosis not present

## 2022-10-31 DIAGNOSIS — E1151 Type 2 diabetes mellitus with diabetic peripheral angiopathy without gangrene: Secondary | ICD-10-CM | POA: Diagnosis not present

## 2022-10-31 DIAGNOSIS — I10 Essential (primary) hypertension: Secondary | ICD-10-CM | POA: Diagnosis not present

## 2022-10-31 DIAGNOSIS — I70213 Atherosclerosis of native arteries of extremities with intermittent claudication, bilateral legs: Secondary | ICD-10-CM | POA: Diagnosis not present

## 2022-10-31 DIAGNOSIS — Z87891 Personal history of nicotine dependence: Secondary | ICD-10-CM | POA: Diagnosis not present

## 2022-10-31 DIAGNOSIS — Z955 Presence of coronary angioplasty implant and graft: Secondary | ICD-10-CM | POA: Insufficient documentation

## 2022-10-31 DIAGNOSIS — I2510 Atherosclerotic heart disease of native coronary artery without angina pectoris: Secondary | ICD-10-CM | POA: Insufficient documentation

## 2022-10-31 DIAGNOSIS — Z7982 Long term (current) use of aspirin: Secondary | ICD-10-CM | POA: Insufficient documentation

## 2022-10-31 DIAGNOSIS — K219 Gastro-esophageal reflux disease without esophagitis: Secondary | ICD-10-CM | POA: Diagnosis not present

## 2022-10-31 DIAGNOSIS — Z01818 Encounter for other preprocedural examination: Secondary | ICD-10-CM | POA: Diagnosis present

## 2022-10-31 DIAGNOSIS — Z86718 Personal history of other venous thrombosis and embolism: Secondary | ICD-10-CM | POA: Diagnosis not present

## 2022-10-31 DIAGNOSIS — R001 Bradycardia, unspecified: Secondary | ICD-10-CM | POA: Insufficient documentation

## 2022-10-31 DIAGNOSIS — I739 Peripheral vascular disease, unspecified: Secondary | ICD-10-CM

## 2022-10-31 DIAGNOSIS — I517 Cardiomegaly: Secondary | ICD-10-CM | POA: Insufficient documentation

## 2022-10-31 DIAGNOSIS — R7989 Other specified abnormal findings of blood chemistry: Secondary | ICD-10-CM

## 2022-10-31 DIAGNOSIS — R82998 Other abnormal findings in urine: Secondary | ICD-10-CM

## 2022-10-31 LAB — COMPREHENSIVE METABOLIC PANEL
ALT: 11 U/L (ref 0–44)
AST: 15 U/L (ref 15–41)
Albumin: 3.2 g/dL — ABNORMAL LOW (ref 3.5–5.0)
Alkaline Phosphatase: 75 U/L (ref 38–126)
Anion gap: 10 (ref 5–15)
BUN: 18 mg/dL (ref 8–23)
CO2: 27 mmol/L (ref 22–32)
Calcium: 8.8 mg/dL — ABNORMAL LOW (ref 8.9–10.3)
Chloride: 102 mmol/L (ref 98–111)
Creatinine, Ser: 1.46 mg/dL — ABNORMAL HIGH (ref 0.61–1.24)
GFR, Estimated: 53 mL/min — ABNORMAL LOW (ref >60)
Glucose, Bld: 132 mg/dL — ABNORMAL HIGH (ref 70–99)
Potassium: 4.5 mmol/L (ref 3.5–5.1)
Sodium: 139 mmol/L (ref 135–145)
Total Bilirubin: 0.4 mg/dL (ref 0.3–1.2)
Total Protein: 6.4 g/dL — ABNORMAL LOW (ref 6.5–8.1)

## 2022-10-31 LAB — SURGICAL PCR SCREEN
MRSA, PCR: NEGATIVE
Staphylococcus aureus: NEGATIVE

## 2022-10-31 LAB — CBC
HCT: 45.6 % (ref 39.0–52.0)
Hemoglobin: 14.6 g/dL (ref 13.0–17.0)
MCH: 29.2 pg (ref 26.0–34.0)
MCHC: 32 g/dL (ref 30.0–36.0)
MCV: 91.2 fL (ref 80.0–100.0)
Platelets: 169 10*3/uL (ref 150–400)
RBC: 5 MIL/uL (ref 4.22–5.81)
RDW: 14.7 % (ref 11.5–15.5)
WBC: 9.6 10*3/uL (ref 4.0–10.5)
nRBC: 0 % (ref 0.0–0.2)

## 2022-10-31 LAB — URINALYSIS, ROUTINE W REFLEX MICROSCOPIC
Glucose, UA: NEGATIVE mg/dL
Hgb urine dipstick: NEGATIVE
Ketones, ur: 5 mg/dL — AB
Nitrite: NEGATIVE
Protein, ur: >=300 mg/dL — AB
Specific Gravity, Urine: 1.022 (ref 1.005–1.030)
pH: 5 (ref 5.0–8.0)

## 2022-10-31 LAB — PROTIME-INR
INR: 0.9 (ref 0.8–1.2)
Prothrombin Time: 12.2 seconds (ref 11.4–15.2)

## 2022-10-31 LAB — GLUCOSE, CAPILLARY: Glucose-Capillary: 158 mg/dL — ABNORMAL HIGH (ref 70–99)

## 2022-10-31 LAB — TYPE AND SCREEN
ABO/RH(D): A POS
Antibody Screen: NEGATIVE

## 2022-10-31 LAB — APTT: aPTT: 30 seconds (ref 24–36)

## 2022-10-31 LAB — HEMOGLOBIN A1C
Hgb A1c MFr Bld: 6.2 % — ABNORMAL HIGH (ref 4.8–5.6)
Mean Plasma Glucose: 131.24 mg/dL

## 2022-10-31 NOTE — Progress Notes (Signed)
PCP - Dr. Salli Real Cardiologist - Dr. Lorine Bears  PPM/ICD - denies   Chest x-ray - 06/12/22 EKG - 06/13/22 Stress Test - 05/10/15 ECHO - 09/17/22 Cardiac Cath - 05/17/15  Sleep Study - denies   DM- pt listed as type 2, he denies this. No medications. He does not check CBG at home and does not know his fasting levels.  Blood Thinner Instructions: n/a Aspirin Instructions: continue taking on DOS  ERAS Protcol - no, NPO   COVID TEST- n/a   Anesthesia review: yes, cardiac hx  Patient denies shortness of breath, fever, cough and chest pain at PAT appointment   All instructions explained to the patient, with a verbal understanding of the material. Patient agrees to go over the instructions while at home for a better understanding. The opportunity to ask questions was provided.

## 2022-11-01 ENCOUNTER — Encounter (HOSPITAL_COMMUNITY): Payer: Self-pay

## 2022-11-01 ENCOUNTER — Telehealth: Payer: Self-pay

## 2022-11-01 MED ORDER — SULFAMETHOXAZOLE-TRIMETHOPRIM 800-160 MG PO TABS: 1.0000 | ORAL_TABLET | Freq: Two times a day (BID) | ORAL | 0 refills | Status: AC

## 2022-11-01 NOTE — Telephone Encounter (Signed)
Received notification from preadmission testing regarding patient's abnormal pre-op urinalysis results. Spoke with patient's wife Darel Hong regarding urinalysis results and will send a Rx for Bactrim DS, take 1 tablet by mouth twice daily x 5 days to pharmacy. She verbalized understanding.

## 2022-11-01 NOTE — Anesthesia Preprocedure Evaluation (Addendum)
Anesthesia Evaluation  Patient identified by MRN, date of birth, ID band Patient awake    Reviewed: Allergy & Precautions, NPO status , Patient's Chart, lab work & pertinent test results, reviewed documented beta blocker date and time   History of Anesthesia Complications Negative for: history of anesthetic complications  Airway Mallampati: III  TM Distance: >3 FB Neck ROM: Full    Dental  (+) Edentulous Upper, Edentulous Lower, Dental Advisory Given   Pulmonary shortness of breath, neg sleep apnea, COPD,  COPD inhaler, neg recent URI, Current Smoker   breath sounds clear to auscultation       Cardiovascular hypertension, Pt. on medications and Pt. on home beta blockers (-) angina + CAD, + Cardiac Stents and + Peripheral Vascular Disease  (-) dysrhythmias  Rhythm:Regular  1. Left ventricular ejection fraction, by estimation, is 60 to 65%. Left  ventricular ejection fraction by 3D volume is 61 %. The left ventricle has  normal function. The left ventricle has no regional wall motion  abnormalities. There is severe concentric   left ventricular hypertrophy. Left ventricular diastolic parameters are  consistent with Grade I diastolic dysfunction (impaired relaxation). The  average left ventricular global longitudinal strain is -21.2 %. The global  longitudinal strain is normal.   2. Right ventricular systolic function is normal. The right ventricular  size is normal. Tricuspid regurgitation signal is inadequate for assessing  PA pressure.   3. The mitral valve is normal in structure. No evidence of mitral valve  regurgitation. No evidence of mitral stenosis.   4. The aortic valve is tricuspid. Aortic valve regurgitation is not  visualized.   5. The inferior vena cava is normal in size with greater than 50%  respiratory variability, suggesting right atrial pressure of 3 mmHg.      Mid LAD lesion, 80% stenosed. Post intervention,  there is a 0% residual stenosis.  Prox LAD to Mid LAD lesion, 80% stenosed. Post intervention, there is a 0% residual stenosis. The lesion was previously treated with angioplasty.   Successful rotational atherectomy and stenting of tandem lesions in the mid LAD with DES.    Neuro/Psych  Neuromuscular disease    GI/Hepatic PUD,GERD  ,,  Endo/Other  diabetes    Renal/GU Renal InsufficiencyRenal diseaseLab Results      Component                Value               Date                      NA                       139                 10/31/2022                K                        4.5                 10/31/2022                CO2                      27  10/31/2022                GLUCOSE                  132 (H)             10/31/2022                BUN                      18                  10/31/2022                CREATININE               1.46 (H)            10/31/2022                CALCIUM                  8.8 (L)             10/31/2022                GFR                      98.86               10/13/2019                EGFR                     101                 12/19/2020                GFRNONAA                 53 (L)              10/31/2022                Musculoskeletal  (+) Arthritis ,    Abdominal   Peds  Hematology negative hematology ROS (+) Lab Results      Component                Value               Date                      WBC                      9.6                 10/31/2022                HGB                      14.6                10/31/2022                HCT                      45.6                10/31/2022  MCV                      91.2                10/31/2022                PLT                      169                 10/31/2022              Anesthesia Other Findings   Reproductive/Obstetrics                             Anesthesia Physical Anesthesia Plan  ASA: 3  Anesthesia  Plan: General   Post-op Pain Management: Ofirmev IV (intra-op)*   Induction: Intravenous  PONV Risk Score and Plan: 1 and Ondansetron and Dexamethasone  Airway Management Planned: Oral ETT  Additional Equipment: Arterial line  Intra-op Plan:   Post-operative Plan: Extubation in OR  Informed Consent: I have reviewed the patients History and Physical, chart, labs and discussed the procedure including the risks, benefits and alternatives for the proposed anesthesia with the patient or authorized representative who has indicated his/her understanding and acceptance.     Dental advisory given  Plan Discussed with: CRNA  Anesthesia Plan Comments: (PAT note written 11/01/2022 by Shonna Chock, PA-C.  )       Anesthesia Quick Evaluation

## 2022-11-01 NOTE — Progress Notes (Signed)
Anesthesia Chart Review:  Case: [id number] Date/Time: 11/08/22 1014   Procedure: LEFT ILIOFEMORAL ENDARTERECTOMY (Left)   Anesthesia type: General   Pre-op diagnosis: Atherosclerosis of native artery of both lower extremities with intermittent claudication   [facility]   Surgeons: Nada Libman, MD       DISCUSSION: Patient is a Joshua [name] scheduled for the above procedure. Multiple vascular procedures including AFBG in 2010 and right FPBG in 2023. He is having progressive claudication. 10/01/22 arteriogram showed high grade stenosis of the distal anastomosis of the left femoral artery with aneurysmal changes. There AFBG and right FPBG were patent. Left iliofemoral endarterectomy recommended.   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), lung cancer (02/25/18 bronchoscopy, RUL + non-small cell carcinoma, he declined lobectomy, s/p radiation 04/14/18-04/21/18), CAD (occluded LCX, atherectomy/tandem DES mLAD 05/17/15), HTN, HLD, PAD (s/p AFBG 2010, [facility]; atherectomy/drug-coated angioplasty right SFA 01/07/18; right femoral endarterectomy, right iliofemoral thrombectomy 12/28/20; failed right SFA angioplasty 04/03/21, s/p right Fem-above knee Popliteal Bypass with GSV graft 05/10/21), DVT (s/p IVC filter ~ 2010; seen by IR 12/17/16 for consideration of IVC filter retrieval, "We agreed to consider this filter permanent at this point and will not proceed with retrieving the filter unless there is a new concern in the future."), pre-diabetes, GERD, spinal surgery (C7-T1 ACDF 10/06/12). 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    He is followed by cardiologist Dr. Kirke Corin for CAD and PAD. Last visit 08/13/22. No angina symptoms. Last LDL 33. BP well controlled. Still smoking, but trying to cut back. He noted patient to be considered for LLE revascularization with vascular surgery. Echo updated due to dyspnea, some LE edema and done on 09/17/22  showing moderate LVH likely due to HTN, no significant valvular abnormalities, normal LVEF with continued medical therapy recommended.   He was admitted to [facility] 06/12/22 - 06/20/22 for severe RLE cellulitis, improved with IV antibiotics but required a prolonged course given severity. He was discharged home on 6 more days of Keflex and doxycycline. Also hydrated for mild AKI with Creatinine peaked at 1.56. Creatinine improved to 1.00 on 06/19/22.  BUN 18, Creatinine 1.46 on 8/15, eGFR 53 up from BUN 15, Creatinine 1.30, eGFR > 60  on 10/01/22 and BUN 15, Creatinine 1.00 on 06/19/22. He is s/p arteriogram on 10/01/22. Bactrim DS BID x 5 days was prescribed on 11/01/22 for abnormal UA. I will send a message to Dr. Myra Gianotti regarding this and consider repeating BMET or iSTAT on the day of surgery.     VS: BP (!) 160/57   Pulse (!) 49   Temp 36.9 C   Resp 18   Ht 5\' 9"  (1.753 m)   Wt 103.1 kg   SpO2 99%   BMI 33.58 kg/m    PROVIDERS: Salli Real, MD is PCP [facility]) - Lorine Bears, MD is cardiologist  - Venida Jarvis, MD is vascular surgeon - Dorothy Puffer, MD is RAD-ONC. S/p radiation for RUL lung cancer in 2020. Per 04/29/22 APP notation, patient no showed for surveillance follow-up or CT imaging on multiple occasions. Stable CXR findings on 06/12/22.  - Last pulmonology visit seen is from 01/19/20 with Audie Box, DO.   LABS: Preoperative labs noted. See DISCUSSION.  (all labs ordered are listed, but only abnormal results are displayed)  Labs Reviewed  GLUCOSE, CAPILLARY - Abnormal; Notable for the following components:  Result Value   Glucose-Capillary 158 (*)    All other components within normal limits  COMPREHENSIVE METABOLIC PANEL - Abnormal; Notable for the following components:   Glucose, Bld 132 (*)    Creatinine, Ser 1.46 (*)    Calcium 8.8 (*)    Total Protein 6.4 (*)    Albumin 3.2 (*)    GFR, Estimated 53 (*)    All other components within  normal limits  URINALYSIS, ROUTINE W REFLEX MICROSCOPIC - Abnormal; Notable for the following components:   APPearance HAZY (*)    Bilirubin Urine SMALL (*)    Ketones, ur 5 (*)    Protein, ur >=300 (*)    Leukocytes,Ua MODERATE (*)    Bacteria, UA RARE (*)    All other components within normal limits  HEMOGLOBIN A1C - Abnormal; Notable for the following components:   Hgb A1c MFr Bld 6.2 (*)    All other components within normal limits  SURGICAL PCR SCREEN  CBC  PROTIME-INR  APTT  TYPE AND SCREEN    PFTs 02/05/18: PRE: FVC 3.05 (66%), FEV1 1.75 (50%). POST: FVC 3.30 (72%), FEV1 2.09 (60%). DLCO unc 23.38 (75%).    IMAGES: 1V PCXR 06/12/22: IMPRESSION: Linear density in right parahilar region has not changed significantly suggesting scarring from previous intervention. There are no new infiltrates or signs of pulmonary edema. There is no pleural effusion or pneumothorax.      EKG: 06/13/22: Sinus bradycardia at 58 bpm Otherwise normal ECG When compared with ECG of 14-Nov-2020 11:26, No significant change since last tracing Confirmed by Riley Lam [phone]) on 06/13/2022 5:13:05 PM   CV: Abdominal Aortogram with BLE angiogram 10/01/22: #1  Patent aortobifemoral bypass graft #2  High-grade stenosis of the distal anastomosis on the left femoral artery with aneurysmal change.  The stenosis extends into the proximal superficial femoral artery #3  Patent right femoral-popliteal bypass graft #4  Patient will be scheduled for left iliofemoral endarterectomy   Echo 09/17/22: IMPRESSIONS   1. Left ventricular ejection fraction, by estimation, is 60 to 65%. Left  ventricular ejection fraction by 3D volume is 61 %. The left ventricle has  normal function. The left ventricle has no regional wall motion  abnormalities. There is severe concentric   left ventricular hypertrophy. Left ventricular diastolic parameters are  consistent with Grade I diastolic dysfunction (impaired  relaxation). The  average left ventricular global longitudinal strain is -21.2 %. The global  longitudinal strain is normal.   2. Right ventricular systolic function is normal. The right ventricular  size is normal. Tricuspid regurgitation signal is inadequate for assessing  PA pressure.   3. The mitral valve is normal in structure. No evidence of mitral valve  regurgitation. No evidence of mitral stenosis.   4. The aortic valve is tricuspid. Aortic valve regurgitation is not  visualized.   5. The inferior vena cava is normal in size with greater than 50%  respiratory variability, suggesting right atrial pressure of 3 mmHg.  - Comparison(s): No significant change from prior study.    Carotid U/S 11/24/17: Final Interpretation: - Right Carotid: Velocities in the right ICA are consistent with a 1-39% stenosis. - Left Carotid: Velocities in the left ICA are consistent with a 1-39% stenosis. Non-hemodynamically significant plaque noted in the CCA. - Vertebrals:  Bilateral vertebral arteries demonstrate antegrade flow. - Subclavians: Normal flow hemodynamics were seen in bilateral subclavian arteries.    PCI 05/17/15: Mid LAD lesion, 80% stenosed. Post intervention, there is a 0%  residual stenosis. Prox LAD to Mid LAD lesion, 80% stenosed. Post intervention, there is a 0% residual stenosis. The lesion was previously treated with angioplasty. Successful rotational atherectomy and stenting of tandem lesions in the mid LAD with DES. Plan DAPT for one year.    Cardiac cath 05/16/15: Prox LAD to Mid LAD lesion, 90% stenosed. Post intervention, there remains 80% residual calcific stenosis. Mid LAD lesion, 80% stenosed. Dist LAD lesion, 55% stenosed. Prox Cx lesion, 100% stenosed. Prox Cx to Mid Cx lesion, 99% stenosed. Ost 2nd Mrg to 2nd Mrg lesion, 99% stenosed. -- Seen via retrograde filling from LAD/diagonal-OM collaterals There is hyperdynamic left ventricular systolic function. Elevated  LVEDP Severe 2 site lesion in the LAD. Very rigid lesions that would not yield to Cutting Balloon or noncompliant balloon angioplasty. These lesions are likely best treated with rotational atherectomy after review with Dr. Peter Swaziland. Plan: Monitor overnight following PTCA. We'll plan for return to the Cath Lab tomorrow for rotational atherectomy of the LAD with stenting of the 2 lesions.     Past Medical History:  Diagnosis Date   AC (acromioclavicular) joint bone spurs, unspecified laterality 11/25/2016   Arthritis    "back" (05/16/2015)   CAD in native artery, occluded Lcx and rotational atherectomy to LAD with DES 05/17/15 05/18/2015   Cavitary lesion of lung 02/25/2018   Chronic back pain    "all my back"   Chronic bronchitis (HCC)    COPD (chronic obstructive pulmonary disease) (HCC)    Diabetes mellitus without complication (HCC)    pt states he is no longer diabetic, no medications   GERD (gastroesophageal reflux disease)    H/O blood clots    "had them in my back; put filter in before one of my neck ORs"   History of gout    Hyperlipidemia    Hypertension    Neuromuscular disorder (HCC)    PAD (peripheral artery disease) (HCC)    Phlebitis    Positive TB test    Seasonal allergies    Shortness of breath    Spinal disease     Past Surgical History:  Procedure Laterality Date   ABDOMINAL AORTOGRAM W/LOWER EXTREMITY  01/07/2018   ABDOMINAL AORTOGRAM W/LOWER EXTREMITY N/A 01/07/2018   Procedure: ABDOMINAL AORTOGRAM W/LOWER EXTREMITY;  Surgeon: Iran Ouch, MD;  [facility];  Service: Cardiovascular;  Laterality: N/A;   ABDOMINAL AORTOGRAM W/LOWER EXTREMITY N/A 12/27/2020   Procedure: ABDOMINAL AORTOGRAM W/LOWER EXTREMITY;  Surgeon: Iran Ouch, MD;  [facility];  Service: Cardiovascular;  Laterality: N/A;   ABDOMINAL AORTOGRAM W/LOWER EXTREMITY N/A 04/03/2021   Procedure: ABDOMINAL AORTOGRAM W/LOWER EXTREMITY;  Surgeon:  Nada Libman, MD;  [facility];  Service: Cardiovascular;  Laterality: N/A;   ABDOMINAL AORTOGRAM W/LOWER EXTREMITY N/A 10/01/2022   Procedure: ABDOMINAL AORTOGRAM W/LOWER EXTREMITY;  Surgeon: Nada Libman, MD;  [facility];  Service: Cardiovascular;  Laterality: N/A;   ANTERIOR CERVICAL DECOMP/DISCECTOMY FUSION  ~ 2001-2009 X 3   ANTERIOR CERVICAL DECOMP/DISCECTOMY FUSION N/A 10/06/2012   Procedure: ANTERIOR CERVICAL DECOMPRESSION/DISCECTOMY FUSION 1 LEVEL;  Surgeon: Reinaldo Meeker, MD;  [facility]: [facility]URO [facility]S;  Service: Neurosurgery;  Laterality: N/A;  C7T1 anterior cervical decompression with fusion plating and bonegraft    BACK SURGERY     BIOPSY  11/21/2020   Procedure: BIOPSY;  Surgeon: Napoleon Form, MD;  Location: [facility] ENDOSCOPY;  Service: Endoscopy;;   CARDIAC CATHETERIZATION  05/16/2015  CARDIAC CATHETERIZATION N/A 05/16/2015   Procedure: Left Heart Cath and Coronary Angiography;  Surgeon: Marykay Lex, MD;  Location: [facility] INVASIVE CV LAB;  Service: Cardiovascular;  Laterality: N/A;   CARDIAC CATHETERIZATION N/A 05/16/2015   Procedure: Coronary Balloon Angioplasty;  Surgeon: Marykay Lex, MD;  Location: [facility] INVASIVE CV LAB;  Service: Cardiovascular;  Laterality: N/A;   CARDIAC CATHETERIZATION N/A 05/17/2015   Procedure: Coronary Stent Intervention Rotoblater;  Surgeon: Peter M Swaziland, MD;  Location: [facility] INVASIVE CV LAB;  Service: Cardiovascular;  Laterality: N/A;   COLONOSCOPY WITH PROPOFOL N/A 11/21/2020   Procedure: COLONOSCOPY WITH PROPOFOL;  Surgeon: Napoleon Form, MD;  Location: [facility];  Service: Endoscopy;  Laterality: N/A;   ELBOW SURGERY Left 2016   "had bone pieces removed"   ENDARTERECTOMY FEMORAL Right 12/28/2020   Procedure: RIGHT FEMORAL ARTERY ENDARTERECTOMY;  Surgeon: Nada Libman, MD;  Location: [facility] OR;  Service: Vascular;  Laterality: Right;   ESOPHAGEAL MANOMETRY N/A 12/30/2016   Procedure: ESOPHAGEAL MANOMETRY  (EM);  Surgeon: Hilarie Fredrickson, MD;  [facility];  Service: Endoscopy;  Laterality: N/A;   ESOPHAGOGASTRODUODENOSCOPY (EGD) WITH PROPOFOL N/A 11/21/2020   Procedure: ESOPHAGOGASTRODUODENOSCOPY (EGD) WITH PROPOFOL;  Surgeon: Napoleon Form, MD;  [facility];  Service: Endoscopy;  Laterality: N/A;   EXCISIONAL HEMORRHOIDECTOMY     FEMORAL BYPASS  ~ 2010   FEMORAL-POPLITEAL BYPASS GRAFT Right 04/04/2021   Procedure: RIGHT FEMORAL-POPLITEAL ARTERY BYPASS GRAFT;  Surgeon: Nada Libman, MD;  [facility];  Service: Vascular;  Laterality: Right;   IR RADIOLOGIST EVAL & MGMT  12/17/2016   IVC FILTER PLACEMENT ([facility] HX)  2010   "had blood clots in my back"   NECK SURGERY  2014   2008-2014   [facility] ANGIOPLASTY Right 12/28/2020   Procedure: PATCH ANGIOPLASTY USING;  Surgeon: Nada Libman, MD;  Location: [facility] OR;  Service: Vascular;  Laterality: Right;   PERIPHERAL VASCULAR ATHERECTOMY  01/07/2018   Procedure: PERIPHERAL VASCULAR ATHERECTOMY;  Surgeon: Iran Ouch, MD;  [facility];  Service: Cardiovascular;;   PERIPHERAL VASCULAR BALLOON ANGIOPLASTY  01/07/2018   Procedure: PERIPHERAL VASCULAR BALLOON ANGIOPLASTY;  Surgeon: Iran Ouch, MD;  [facility];  Service: Cardiovascular;;   PERIPHERAL VASCULAR CATHETERIZATION N/A 02/14/2016   Procedure: Abdominal Aortogram w/Lower Extremity;  Surgeon: Iran Ouch, MD;  [facility];  Service: Cardiovascular;  Laterality: N/A;   PERIPHERAL VASCULAR INTERVENTION Right 04/03/2021   Procedure: PERIPHERAL VASCULAR INTERVENTION;  Surgeon: Nada Libman, MD;  [facility];  Service: Cardiovascular;  Laterality: Right;   POLYPECTOMY  11/21/2020   Procedure: POLYPECTOMY;  Surgeon: Napoleon Form, MD;  [facility];  Service: Endoscopy;;   POSTERIOR LAMINECTOMY / DECOMPRESSION LUMBAR SPINE  2006   "bone spurs"   VIDEO BRONCHOSCOPY WITH  ENDOBRONCHIAL NAVIGATION Right 02/25/2018   Procedure: VIDEO BRONCHOSCOPY WITH ENDOBRONCHIAL NAVIGATION;  Surgeon: Josephine Igo, DO;  [facility];  Service: Thoracic;  Laterality: Right;   VIDEO BRONCHOSCOPY WITH ENDOBRONCHIAL ULTRASOUND Right 02/25/2018   Procedure: VIDEO BRONCHOSCOPY WITH ENDOBRONCHIAL ULTRASOUND;  Surgeon: Josephine Igo, DO;  [facility];  Service: Thoracic;  Laterality: Right;    MEDICATIONS:  albuterol (PROAIR HFA) 108 (90 Base) MCG/ACT inhaler   albuterol (PROVENTIL) (2.5 MG/3ML) 0.083% nebulizer solution   aspirin EC 81 MG tablet   carvedilol (COREG) 12.5 MG tablet   Cholecalciferol (VITAMIN D) 50 MCG (2000 UT) tablet  colchicine 0.6 MG tablet   EPINEPHrine (PRIMATENE MIST) 0.125 MG/ACT AERO   ezetimibe (ZETIA) 10 MG tablet   Fluticasone-Umeclidin-Vilant (TRELEGY ELLIPTA) 200-62.5-25 MCG/ACT AEPB   HYDROcodone-acetaminophen (NORCO/VICODIN) 5-325 MG tablet   omeprazole (PRILOSEC) 40 MG capsule   pregabalin (LYRICA) 100 MG capsule   rosuvastatin (CRESTOR) 40 MG tablet   sulfamethoxazole-trimethoprim (BACTRIM DS) 800-160 MG tablet   traZODone (DESYREL) 100 MG tablet   No current facility-administered medications for this encounter.    Shonna Chock, PA-C Surgical Short Stay/Anesthesiology [facility] Phone [phone] [facility] Phone [phone] 11/01/2022 3:48 PM

## 2022-11-08 ENCOUNTER — Inpatient Hospital Stay (HOSPITAL_COMMUNITY)
Admission: RE | Admit: 2022-11-08 | Discharge: 2022-11-09 | DRG: 272 | Disposition: A | Discharge status: Home / Self Care | Payer: Medicare Other | Attending: Surgery | Admitting: Surgery

## 2022-11-08 ENCOUNTER — Inpatient Hospital Stay (HOSPITAL_COMMUNITY): Payer: Medicare Other | Admitting: Registered Nurse

## 2022-11-08 ENCOUNTER — Inpatient Hospital Stay (HOSPITAL_COMMUNITY): Payer: Medicare Other | Admitting: Vascular Surgery

## 2022-11-08 ENCOUNTER — Encounter (HOSPITAL_COMMUNITY)
Admission: RE | Disposition: A | Discharge status: Home / Self Care | Payer: Self-pay | Source: Home / Self Care | Attending: Surgery

## 2022-11-08 ENCOUNTER — Other Ambulatory Visit: Payer: Self-pay

## 2022-11-08 ENCOUNTER — Encounter (HOSPITAL_COMMUNITY): Payer: Self-pay | Admitting: Surgery

## 2022-11-08 DIAGNOSIS — I739 Peripheral vascular disease, unspecified: Secondary | ICD-10-CM

## 2022-11-08 DIAGNOSIS — F17210 Nicotine dependence, cigarettes, uncomplicated: Secondary | ICD-10-CM | POA: Diagnosis present

## 2022-11-08 DIAGNOSIS — I743 Embolism and thrombosis of arteries of the lower extremities: Secondary | ICD-10-CM | POA: Diagnosis present

## 2022-11-08 DIAGNOSIS — T82858A Stenosis of vascular prosthetic devices, implants and grafts, initial encounter: Secondary | ICD-10-CM

## 2022-11-08 DIAGNOSIS — I70212 Atherosclerosis of native arteries of extremities with intermittent claudication, left leg: Secondary | ICD-10-CM | POA: Diagnosis not present

## 2022-11-08 DIAGNOSIS — I70213 Atherosclerosis of native arteries of extremities with intermittent claudication, bilateral legs: Secondary | ICD-10-CM

## 2022-11-08 DIAGNOSIS — Z9861 Coronary angioplasty status: Secondary | ICD-10-CM | POA: Diagnosis not present

## 2022-11-08 DIAGNOSIS — Z888 Allergy status to other drugs, medicaments and biological substances status: Secondary | ICD-10-CM

## 2022-11-08 DIAGNOSIS — K219 Gastro-esophageal reflux disease without esophagitis: Secondary | ICD-10-CM | POA: Diagnosis present

## 2022-11-08 DIAGNOSIS — E1151 Type 2 diabetes mellitus with diabetic peripheral angiopathy without gangrene: Principal | ICD-10-CM | POA: Diagnosis present

## 2022-11-08 DIAGNOSIS — I2510 Atherosclerotic heart disease of native coronary artery without angina pectoris: Secondary | ICD-10-CM | POA: Diagnosis present

## 2022-11-08 DIAGNOSIS — I513 Intracardiac thrombosis, not elsewhere classified: Secondary | ICD-10-CM | POA: Diagnosis present

## 2022-11-08 DIAGNOSIS — E7800 Pure hypercholesterolemia, unspecified: Secondary | ICD-10-CM | POA: Diagnosis present

## 2022-11-08 DIAGNOSIS — I70312 Atherosclerosis of unspecified type of bypass graft(s) of the extremities with intermittent claudication, left leg: Secondary | ICD-10-CM | POA: Diagnosis present

## 2022-11-08 DIAGNOSIS — R7989 Other specified abnormal findings of blood chemistry: Secondary | ICD-10-CM

## 2022-11-08 DIAGNOSIS — J449 Chronic obstructive pulmonary disease, unspecified: Secondary | ICD-10-CM | POA: Diagnosis present

## 2022-11-08 DIAGNOSIS — Z91041 Radiographic dye allergy status: Secondary | ICD-10-CM | POA: Diagnosis not present

## 2022-11-08 DIAGNOSIS — I10 Essential (primary) hypertension: Secondary | ICD-10-CM

## 2022-11-08 DIAGNOSIS — E1142 Type 2 diabetes mellitus with diabetic polyneuropathy: Secondary | ICD-10-CM

## 2022-11-08 DIAGNOSIS — R82998 Other abnormal findings in urine: Secondary | ICD-10-CM

## 2022-11-08 DIAGNOSIS — Z01818 Encounter for other preprocedural examination: Secondary | ICD-10-CM

## 2022-11-08 HISTORY — PX: THROMBECTOMY FEMORAL ARTERY: SHX6406

## 2022-11-08 HISTORY — PX: THROMBECTOMY ILIAC ARTERY: SHX6405

## 2022-11-08 HISTORY — PX: ENDARTERECTOMY: SHX5162

## 2022-11-08 HISTORY — PX: PATCH ANGIOPLASTY: SHX6230

## 2022-11-08 HISTORY — PX: APPLICATION OF WOUND VAC: SHX5189

## 2022-11-08 LAB — CBC
HCT: 43 % (ref 39.0–52.0)
Hemoglobin: 14.1 g/dL (ref 13.0–17.0)
MCH: 29.1 pg (ref 26.0–34.0)
MCHC: 32.8 g/dL (ref 30.0–36.0)
MCV: 88.8 fL (ref 80.0–100.0)
Platelets: 151 10*3/uL (ref 150–400)
RBC: 4.84 MIL/uL (ref 4.22–5.81)
RDW: 15.1 % (ref 11.5–15.5)
WBC: 12.2 10*3/uL — ABNORMAL HIGH (ref 4.0–10.5)
nRBC: 0 % (ref 0.0–0.2)

## 2022-11-08 LAB — POCT I-STAT 7, (LYTES, BLD GAS, ICA,H+H)
Acid-base deficit: 4 mmol/L — ABNORMAL HIGH (ref 0.0–2.0)
Bicarbonate: 23.5 mmol/L (ref 20.0–28.0)
Calcium, Ion: 1.2 mmol/L (ref 1.15–1.40)
HCT: 38 % — ABNORMAL LOW (ref 39.0–52.0)
Hemoglobin: 12.9 g/dL — ABNORMAL LOW (ref 13.0–17.0)
O2 Saturation: 99 %
Patient temperature: 36.3
Potassium: 5.1 mmol/L (ref 3.5–5.1)
Sodium: 137 mmol/L (ref 135–145)
TCO2: 25 mmol/L (ref 22–32)
pCO2 arterial: 49.3 mmHg — ABNORMAL HIGH (ref 32–48)
pH, Arterial: 7.283 — ABNORMAL LOW (ref 7.35–7.45)
pO2, Arterial: 128 mmHg — ABNORMAL HIGH (ref 83–108)

## 2022-11-08 LAB — GLUCOSE, CAPILLARY
Glucose-Capillary: 83 mg/dL (ref 70–99)
Glucose-Capillary: 82 mg/dL (ref 70–99)
Glucose-Capillary: 99 mg/dL (ref 70–99)

## 2022-11-08 LAB — CREATININE, SERUM
Creatinine, Ser: 1.3 mg/dL — ABNORMAL HIGH (ref 0.61–1.24)
GFR, Estimated: >60 mL/min (ref >60)

## 2022-11-08 LAB — POCT ACTIVATED CLOTTING TIME: Activated Clotting Time: 287 seconds

## 2022-11-08 SURGERY — ENDARTERECTOMY, ILIAC
Anesthesia: General | Site: Groin

## 2022-11-08 MED ORDER — SODIUM CHLORIDE 0.9 % IV SOLN: 500.0000 mL | Freq: Once | INTRAVENOUS | Status: DC | PRN

## 2022-11-08 MED ORDER — MIDAZOLAM HCL 2 MG/2ML IJ SOLN
INTRAMUSCULAR | Status: DC | PRN
  Administered 2022-11-08: 2 mg via INTRAVENOUS

## 2022-11-08 MED ORDER — HEPARIN 6000 UNIT IRRIGATION SOLUTION
Status: DC | PRN
  Administered 2022-11-08: 1

## 2022-11-08 MED ORDER — DEXAMETHASONE SODIUM PHOSPHATE 10 MG/ML IJ SOLN
INTRAMUSCULAR | Status: DC | PRN
  Administered 2022-11-08: 4 mg via INTRAVENOUS

## 2022-11-08 MED ORDER — HYDROMORPHONE HCL 1 MG/ML IJ SOLN
INTRAMUSCULAR | Status: AC
  Filled 2022-11-08: qty 0.5

## 2022-11-08 MED ORDER — DEXAMETHASONE SODIUM PHOSPHATE 10 MG/ML IJ SOLN
INTRAMUSCULAR | Status: AC
  Filled 2022-11-08: qty 1

## 2022-11-08 MED ORDER — GLYCOPYRROLATE PF 0.2 MG/ML IJ SOSY
PREFILLED_SYRINGE | INTRAMUSCULAR | Status: DC | PRN
  Administered 2022-11-08: .2 mg via INTRAVENOUS

## 2022-11-08 MED ORDER — LIDOCAINE 2% (20 MG/ML) 5 ML SYRINGE
INTRAMUSCULAR | Status: AC
  Filled 2022-11-08: qty 5

## 2022-11-08 MED ORDER — GUAIFENESIN-DM 100-10 MG/5ML PO SYRP: 15.0000 mL | ORAL_SOLUTION | ORAL | Status: DC | PRN

## 2022-11-08 MED ORDER — EPINEPHRINE 0.125 MG/ACT IN AERO: 1.0000 | INHALATION_SPRAY | Freq: Every day | RESPIRATORY_TRACT | Status: DC | PRN

## 2022-11-08 MED ORDER — ACETAMINOPHEN 10 MG/ML IV SOLN: 1000.0000 mg | Freq: Once | INTRAVENOUS | Status: DC | PRN

## 2022-11-08 MED ORDER — ONDANSETRON HCL 4 MG/2ML IJ SOLN
INTRAMUSCULAR | Status: AC
  Filled 2022-11-08: qty 2

## 2022-11-08 MED ORDER — ALBUTEROL SULFATE (2.5 MG/3ML) 0.083% IN NEBU: 2.5000 mg | INHALATION_SOLUTION | Freq: Four times a day (QID) | RESPIRATORY_TRACT | Status: DC | PRN

## 2022-11-08 MED ORDER — ONDANSETRON HCL 4 MG/2ML IJ SOLN: 4.0000 mg | Freq: Four times a day (QID) | INTRAMUSCULAR | Status: DC | PRN

## 2022-11-08 MED ORDER — POLYETHYLENE GLYCOL 3350 17 G PO PACK: 17.0000 g | PACK | Freq: Every day | ORAL | Status: DC | PRN

## 2022-11-08 MED ORDER — ROCURONIUM BROMIDE 10 MG/ML (PF) SYRINGE
PREFILLED_SYRINGE | INTRAVENOUS | Status: AC
  Filled 2022-11-08: qty 10

## 2022-11-08 MED ORDER — ALUM & MAG HYDROXIDE-SIMETH 200-200-20 MG/5ML PO SUSP: 15.0000 mL | ORAL | Status: DC | PRN

## 2022-11-08 MED ORDER — EPHEDRINE 5 MG/ML INJ
INTRAVENOUS | Status: AC
  Filled 2022-11-08: qty 5

## 2022-11-08 MED ORDER — HYDRALAZINE HCL 20 MG/ML IJ SOLN
INTRAMUSCULAR | Status: AC
  Filled 2022-11-08: qty 1

## 2022-11-08 MED ORDER — SODIUM CHLORIDE 0.9 % IV SOLN: INTRAVENOUS | Status: DC

## 2022-11-08 MED ORDER — FENTANYL CITRATE (PF) 250 MCG/5ML IJ SOLN
INTRAMUSCULAR | Status: AC
  Filled 2022-11-08: qty 5

## 2022-11-08 MED ORDER — FENTANYL CITRATE (PF) 250 MCG/5ML IJ SOLN
INTRAMUSCULAR | Status: DC | PRN
  Administered 2022-11-08: 150 ug via INTRAVENOUS
  Administered 2022-11-08: 100 ug via INTRAVENOUS

## 2022-11-08 MED ORDER — SODIUM CHLORIDE 0.9 % IR SOLN
Status: DC | PRN
  Administered 2022-11-08: 2000 mL

## 2022-11-08 MED ORDER — HYDRALAZINE HCL 20 MG/ML IJ SOLN
5.0000 mg | INTRAMUSCULAR | Status: AC | PRN
  Administered 2022-11-08: 5 mg via INTRAVENOUS

## 2022-11-08 MED ORDER — MORPHINE SULFATE (PF) 2 MG/ML IV SOLN: 2.0000 mg | INTRAVENOUS | Status: DC | PRN

## 2022-11-08 MED ORDER — DEXMEDETOMIDINE HCL IN NACL 80 MCG/20ML IV SOLN
INTRAVENOUS | Status: DC | PRN
  Administered 2022-11-08: 8 ug via INTRAVENOUS

## 2022-11-08 MED ORDER — LABETALOL HCL 5 MG/ML IV SOLN: 10.0000 mg | INTRAVENOUS | Status: DC | PRN

## 2022-11-08 MED ORDER — ROSUVASTATIN CALCIUM 20 MG PO TABS
40.0000 mg | ORAL_TABLET | Freq: Every day | ORAL | Status: DC
  Administered 2022-11-08 – 2022-11-09 (×2): 40 mg via ORAL
  Filled 2022-11-08 (×2): qty 2

## 2022-11-08 MED ORDER — SURGIFLO WITH THROMBIN (HEMOSTATIC MATRIX KIT) OPTIME
TOPICAL | Status: DC | PRN
  Administered 2022-11-08: 1 via TOPICAL

## 2022-11-08 MED ORDER — BISACODYL 10 MG RE SUPP: 10.0000 mg | Freq: Every day | RECTAL | Status: DC | PRN

## 2022-11-08 MED ORDER — CHLORHEXIDINE GLUCONATE CLOTH 2 % EX PADS
6.0000 | MEDICATED_PAD | Freq: Once | CUTANEOUS | Status: DC
  Administered 2022-11-08: 6 via TOPICAL

## 2022-11-08 MED ORDER — LACTATED RINGERS IV SOLN: INTRAVENOUS | Status: DC

## 2022-11-08 MED ORDER — HEPARIN SODIUM (PORCINE) 5000 UNIT/ML IJ SOLN
5000.0000 [IU] | Freq: Three times a day (TID) | INTRAMUSCULAR | Status: DC
  Administered 2022-11-09: 5000 [IU] via SUBCUTANEOUS
  Filled 2022-11-08: qty 1

## 2022-11-08 MED ORDER — PROPOFOL 10 MG/ML IV BOLUS
INTRAVENOUS | Status: DC | PRN
  Administered 2022-11-08: 60 mg via INTRAVENOUS
  Administered 2022-11-08: 140 mg via INTRAVENOUS

## 2022-11-08 MED ORDER — ACETAMINOPHEN 160 MG/5ML PO SOLN: 1000.0000 mg | Freq: Once | ORAL | Status: DC | PRN

## 2022-11-08 MED ORDER — PROTAMINE SULFATE 10 MG/ML IV SOLN
INTRAVENOUS | Status: AC
  Filled 2022-11-08: qty 5

## 2022-11-08 MED ORDER — ACETAMINOPHEN 650 MG RE SUPP: 325.0000 mg | RECTAL | Status: DC | PRN

## 2022-11-08 MED ORDER — EZETIMIBE 10 MG PO TABS
10.0000 mg | ORAL_TABLET | Freq: Every day | ORAL | Status: DC
  Administered 2022-11-08: 10 mg via ORAL
  Filled 2022-11-08 (×2): qty 1

## 2022-11-08 MED ORDER — PANTOPRAZOLE SODIUM 40 MG PO TBEC
40.0000 mg | DELAYED_RELEASE_TABLET | Freq: Every day | ORAL | Status: DC
  Administered 2022-11-08 – 2022-11-09 (×2): 40 mg via ORAL
  Filled 2022-11-08 (×2): qty 1

## 2022-11-08 MED ORDER — METOPROLOL TARTRATE 5 MG/5ML IV SOLN: 2.0000 mg | INTRAVENOUS | Status: DC | PRN

## 2022-11-08 MED ORDER — HYDRALAZINE HCL 20 MG/ML IJ SOLN
INTRAMUSCULAR | Status: AC
  Administered 2022-11-08: 5 mg via INTRAVENOUS
  Filled 2022-11-08: qty 1

## 2022-11-08 MED ORDER — PHENYLEPHRINE HCL-NACL 20-0.9 MG/250ML-% IV SOLN
INTRAVENOUS | Status: DC | PRN
  Administered 2022-11-08: 40 ug/min via INTRAVENOUS

## 2022-11-08 MED ORDER — MAGNESIUM SULFATE 2 GM/50ML IV SOLN: 2.0000 g | Freq: Every day | INTRAVENOUS | Status: DC | PRN

## 2022-11-08 MED ORDER — FENTANYL CITRATE (PF) 100 MCG/2ML IJ SOLN
25.0000 ug | INTRAMUSCULAR | Status: DC | PRN
  Administered 2022-11-08: 25 ug via INTRAVENOUS

## 2022-11-08 MED ORDER — SUGAMMADEX SODIUM 200 MG/2ML IV SOLN
INTRAVENOUS | Status: DC | PRN
  Administered 2022-11-08: 300 mg via INTRAVENOUS

## 2022-11-08 MED ORDER — LACTATED RINGERS IV SOLN: INTRAVENOUS | Status: DC | PRN

## 2022-11-08 MED ORDER — ASPIRIN 81 MG PO TBEC
81.0000 mg | DELAYED_RELEASE_TABLET | Freq: Every day | ORAL | Status: DC
  Administered 2022-11-09: 81 mg via ORAL
  Filled 2022-11-08: qty 1

## 2022-11-08 MED ORDER — ORAL CARE MOUTH RINSE: 15.0000 mL | Freq: Once | OROMUCOSAL | Status: AC

## 2022-11-08 MED ORDER — MIDAZOLAM HCL 2 MG/2ML IJ SOLN
INTRAMUSCULAR | Status: AC
  Filled 2022-11-08: qty 2

## 2022-11-08 MED ORDER — HYDRALAZINE HCL 20 MG/ML IJ SOLN
INTRAMUSCULAR | Status: DC | PRN
Start: 2022-11-08 — End: 2022-11-08
  Administered 2022-11-08: 2.5 mg via INTRAVENOUS

## 2022-11-08 MED ORDER — HYDROMORPHONE HCL 1 MG/ML IJ SOLN
INTRAMUSCULAR | Status: DC | PRN
  Administered 2022-11-08: .5 mg via INTRAVENOUS

## 2022-11-08 MED ORDER — CEFAZOLIN SODIUM-DEXTROSE 2-4 GM/100ML-% IV SOLN
2.0000 g | Freq: Three times a day (TID) | INTRAVENOUS | Status: AC
  Administered 2022-11-08 – 2022-11-09 (×2): 2 g via INTRAVENOUS
  Filled 2022-11-08 (×2): qty 100

## 2022-11-08 MED ORDER — ACETAMINOPHEN 500 MG PO TABS: 1000.0000 mg | ORAL_TABLET | Freq: Once | ORAL | Status: DC | PRN

## 2022-11-08 MED ORDER — LIDOCAINE 2% (20 MG/ML) 5 ML SYRINGE
INTRAMUSCULAR | Status: DC | PRN
  Administered 2022-11-08: 60 mg via INTRAVENOUS

## 2022-11-08 MED ORDER — HEPARIN SODIUM (PORCINE) 1000 UNIT/ML IJ SOLN
INTRAMUSCULAR | Status: DC | PRN
  Administered 2022-11-08: 10000 [IU] via INTRAVENOUS

## 2022-11-08 MED ORDER — PHENOL 1.4 % MT LIQD: 1.0000 | OROMUCOSAL | Status: DC | PRN

## 2022-11-08 MED ORDER — PHENYLEPHRINE 80 MCG/ML (10ML) SYRINGE FOR IV PUSH (FOR BLOOD PRESSURE SUPPORT)
PREFILLED_SYRINGE | INTRAVENOUS | Status: DC | PRN
  Administered 2022-11-08 (×3): 80 ug via INTRAVENOUS

## 2022-11-08 MED ORDER — PROTAMINE SULFATE 10 MG/ML IV SOLN
INTRAVENOUS | Status: DC | PRN
  Administered 2022-11-08: 50 mg via INTRAVENOUS

## 2022-11-08 MED ORDER — HEPARIN 6000 UNIT IRRIGATION SOLUTION
Status: AC
  Filled 2022-11-08: qty 500

## 2022-11-08 MED ORDER — EPHEDRINE SULFATE-NACL 50-0.9 MG/10ML-% IV SOSY
PREFILLED_SYRINGE | INTRAVENOUS | Status: DC | PRN
  Administered 2022-11-08: 5 mg via INTRAVENOUS

## 2022-11-08 MED ORDER — ONDANSETRON HCL 4 MG/2ML IJ SOLN
INTRAMUSCULAR | Status: DC | PRN
  Administered 2022-11-08: 4 mg via INTRAVENOUS

## 2022-11-08 MED ORDER — DOCUSATE SODIUM 100 MG PO CAPS
100.0000 mg | ORAL_CAPSULE | Freq: Every day | ORAL | Status: DC
  Filled 2022-11-08: qty 1

## 2022-11-08 MED ORDER — OXYCODONE HCL 5 MG PO TABS
5.0000 mg | ORAL_TABLET | ORAL | Status: DC | PRN
  Administered 2022-11-08 – 2022-11-09 (×2): 10 mg via ORAL
  Filled 2022-11-08 (×2): qty 2

## 2022-11-08 MED ORDER — INSULIN ASPART 100 UNIT/ML IJ SOLN: 0.0000 [IU] | INTRAMUSCULAR | Status: DC | PRN

## 2022-11-08 MED ORDER — CEFAZOLIN SODIUM-DEXTROSE 2-4 GM/100ML-% IV SOLN
2.0000 g | INTRAVENOUS | Status: AC
  Administered 2022-11-08: 2 g via INTRAVENOUS
  Filled 2022-11-08: qty 100

## 2022-11-08 MED ORDER — ACETAMINOPHEN 325 MG PO TABS
325.0000 mg | ORAL_TABLET | ORAL | Status: DC | PRN
  Administered 2022-11-08: 650 mg via ORAL
  Filled 2022-11-08: qty 2

## 2022-11-08 MED ORDER — PROPOFOL 10 MG/ML IV BOLUS
INTRAVENOUS | Status: AC
  Filled 2022-11-08: qty 20

## 2022-11-08 MED ORDER — UMECLIDINIUM BROMIDE 62.5 MCG/ACT IN AEPB
1.0000 | INHALATION_SPRAY | Freq: Every day | RESPIRATORY_TRACT | Status: DC
  Administered 2022-11-09: 1 via RESPIRATORY_TRACT
  Filled 2022-11-08: qty 7

## 2022-11-08 MED ORDER — TRAZODONE HCL 100 MG PO TABS
100.0000 mg | ORAL_TABLET | Freq: Every evening | ORAL | Status: DC | PRN
  Administered 2022-11-08: 100 mg via ORAL
  Filled 2022-11-08: qty 1

## 2022-11-08 MED ORDER — OXYCODONE HCL 5 MG/5ML PO SOLN: 5.0000 mg | Freq: Once | ORAL | Status: DC | PRN

## 2022-11-08 MED ORDER — PREGABALIN 100 MG PO CAPS
100.0000 mg | ORAL_CAPSULE | Freq: Three times a day (TID) | ORAL | Status: DC
  Administered 2022-11-08 – 2022-11-09 (×2): 100 mg via ORAL
  Filled 2022-11-08 (×2): qty 1

## 2022-11-08 MED ORDER — CHLORHEXIDINE GLUCONATE 0.12 % MT SOLN
15.0000 mL | Freq: Once | OROMUCOSAL | Status: AC
  Administered 2022-11-08: 15 mL via OROMUCOSAL
  Filled 2022-11-08: qty 15

## 2022-11-08 MED ORDER — ROCURONIUM BROMIDE 10 MG/ML (PF) SYRINGE
PREFILLED_SYRINGE | INTRAVENOUS | Status: DC | PRN
  Administered 2022-11-08: 30 mg via INTRAVENOUS
  Administered 2022-11-08: 70 mg via INTRAVENOUS
  Administered 2022-11-08: 30 mg via INTRAVENOUS

## 2022-11-08 MED ORDER — POTASSIUM CHLORIDE CRYS ER 20 MEQ PO TBCR: 20.0000 meq | EXTENDED_RELEASE_TABLET | Freq: Every day | ORAL | Status: DC | PRN

## 2022-11-08 MED ORDER — FENTANYL CITRATE (PF) 100 MCG/2ML IJ SOLN
INTRAMUSCULAR | Status: AC
  Administered 2022-11-08: 25 ug via INTRAVENOUS
  Filled 2022-11-08: qty 2

## 2022-11-08 MED ORDER — FLUTICASONE FUROATE-VILANTEROL 200-25 MCG/ACT IN AEPB
1.0000 | INHALATION_SPRAY | Freq: Every day | RESPIRATORY_TRACT | Status: DC
  Administered 2022-11-09: 1 via RESPIRATORY_TRACT
  Filled 2022-11-08: qty 28

## 2022-11-08 MED ORDER — CARVEDILOL 12.5 MG PO TABS
12.5000 mg | ORAL_TABLET | Freq: Two times a day (BID) | ORAL | Status: DC
  Administered 2022-11-09: 12.5 mg via ORAL
  Filled 2022-11-08: qty 1

## 2022-11-08 MED ORDER — OXYCODONE HCL 5 MG PO TABS: 5.0000 mg | ORAL_TABLET | Freq: Once | ORAL | Status: DC | PRN

## 2022-11-08 SURGICAL SUPPLY — 51 items
ADH SKN CLS APL DERMABOND .7 (GAUZE/BANDAGES/DRESSINGS) ×2
AGENT HMST KT MTR STRL THRMB (HEMOSTASIS) ×2
BAG COUNTER SPONGE SURGICOUNT (BAG) ×2 IMPLANT
BAG SPNG CNTER NS LX DISP (BAG) ×2
BNDG ELASTIC 4X5.8 VLCR STR LF (GAUZE/BANDAGES/DRESSINGS) IMPLANT
CANISTER SUCT 3000ML PPV (MISCELLANEOUS) ×2 IMPLANT
CATH EMB 4FR 40 (CATHETERS) IMPLANT
CATH EMB 6FR 80 (CATHETERS) IMPLANT
CLIP TI MEDIUM 24 (CLIP) ×2 IMPLANT
CLIP TI WIDE RED SMALL 24 (CLIP) ×2 IMPLANT
DERMABOND ADVANCED .7 DNX12 (GAUZE/BANDAGES/DRESSINGS) ×2 IMPLANT
DRAIN CHANNEL 15F RND FF W/TCR (WOUND CARE) IMPLANT
DRAIN RELI 100 BL SUC LF ST (DRAIN)
DRAPE X-RAY CASS 24X20 (DRAPES) IMPLANT
DRESSING PEEL AND PLC PRVNA 13 (GAUZE/BANDAGES/DRESSINGS) IMPLANT
DRSG PEEL AND PLACE PREVENA 13 (GAUZE/BANDAGES/DRESSINGS) ×2
ELECT REM PT RETURN 9FT ADLT (ELECTROSURGICAL) ×2
ELECTRODE REM PT RTRN 9FT ADLT (ELECTROSURGICAL) ×2 IMPLANT
EVACUATOR SILICONE 100CC (DRAIN) IMPLANT
GLOVE SURG SS PI 7.5 STRL IVOR (GLOVE) ×6 IMPLANT
GOWN STRL REUS W/ TWL LRG LVL3 (GOWN DISPOSABLE) ×4 IMPLANT
GOWN STRL REUS W/ TWL XL LVL3 (GOWN DISPOSABLE) ×2 IMPLANT
GOWN STRL REUS W/TWL LRG LVL3 (GOWN DISPOSABLE) ×4
GOWN STRL REUS W/TWL XL LVL3 (GOWN DISPOSABLE) ×2
HEMOSTAT SNOW SURGICEL 2X4 (HEMOSTASIS) IMPLANT
KIT BASIN OR (CUSTOM PROCEDURE TRAY) ×2 IMPLANT
KIT DRSG PREVENA PLUS 7DAY 125 (MISCELLANEOUS) IMPLANT
KIT TURNOVER KIT B (KITS) ×2 IMPLANT
LOOP VASCULAR MINI 18 RED (MISCELLANEOUS) ×2
NS IRRIG 1000ML POUR BTL (IV SOLUTION) ×4 IMPLANT
PACK PERIPHERAL VASCULAR (CUSTOM PROCEDURE TRAY) ×2 IMPLANT
PAD ARMBOARD 7.5X6 YLW CONV (MISCELLANEOUS) ×4 IMPLANT
PATCH HEMASHIELD 8X150 (Vascular Products) IMPLANT
SET COLLECT BLD 21X3/4 12 (NEEDLE) IMPLANT
STOPCOCK 4 WAY LG BORE MALE ST (IV SETS) IMPLANT
SURGIFLO W/THROMBIN 8M KIT (HEMOSTASIS) IMPLANT
SUT ETHILON 3 0 PS 1 (SUTURE) IMPLANT
SUT PROLENE 5 0 C 1 24 (SUTURE) ×2 IMPLANT
SUT PROLENE 6 0 BV (SUTURE) ×2 IMPLANT
SUT VIC AB 2-0 CT1 27 (SUTURE) ×2
SUT VIC AB 2-0 CT1 TAPERPNT 27 (SUTURE) ×2 IMPLANT
SUT VIC AB 3-0 SH 27 (SUTURE) ×2
SUT VIC AB 3-0 SH 27X BRD (SUTURE) ×2 IMPLANT
SUT VICRYL 4-0 PS2 18IN ABS (SUTURE) IMPLANT
SYR 3ML LL SCALE MARK (SYRINGE) IMPLANT
TOWEL GREEN STERILE (TOWEL DISPOSABLE) ×2 IMPLANT
TRAY FOLEY MTR SLVR 16FR STAT (SET/KITS/TRAYS/PACK) ×2 IMPLANT
TUBING EXTENTION W/L.L. (IV SETS) IMPLANT
UNDERPAD 30X36 HEAVY ABSORB (UNDERPADS AND DIAPERS) ×2 IMPLANT
VASCULAR TIE MINI RED 18IN STL (MISCELLANEOUS) IMPLANT
WATER STERILE IRR 1000ML POUR (IV SOLUTION) ×2 IMPLANT

## 2022-11-08 NOTE — Anesthesia Procedure Notes (Signed)
Arterial Line Insertion Start/End8/23/2024 11:50 AM, 11/08/2022 12:00 PM Performed by: Cheree Ditto, CRNA, CRNA  Patient location: Pre-op. Preanesthetic checklist: patient identified, IV checked, site marked, risks and benefits discussed, surgical consent, monitors and equipment checked, pre-op evaluation, timeout performed and anesthesia consent Lidocaine 1% used for infiltration Left, radial was placed Catheter size: 20 G Hand hygiene performed , maximum sterile barriers used  and Seldinger technique used Allen's test indicative of satisfactory collateral circulation Attempts: 1 Procedure performed without using ultrasound guided technique. Following insertion, dressing applied and Biopatch. Post procedure assessment: normal  Patient tolerated the procedure well with no immediate complications.

## 2022-11-08 NOTE — Op Note (Signed)
Patient name: Joshua [name] MRN: [id number] DOB: March 14, 1956 Sex: male  11/08/2022 Pre-operative Diagnosis: Left leg claudication graft stenosis Post-operative diagnosis:  Same Surgeon:  [name] Assistants:  Antony Blackbird, PA Procedure:   #1: Redo left femoral artery exposure   #2: Thrombectomy of left superficial femoral artery, and left limb of aortobifemoral bypass graft   #3: Left common femoral and superficial femoral endarterectomy with dacryon patch angioplasty   #4: Prevena wound VAC placement Anesthesia:  General Blood Loss:  minimal Specimens:  none  Findings: Extensive plaque in the common femoral artery and superficial femoral artery.  The plaque in the superficial femoral artery continued distally however I got a good endpoint.  There was mural thrombus within the left limb of the aortobifemoral graft as well as the common femoral artery which was completely removed.  A dacryon patch was placed beginning on the hood of the anastomosis of the aorta bifemoral left limb graft which extended down onto the superficial femoral artery for about 3 cm.  Indications: This is a 67 year old gentleman with severe left leg claudication.  Angiography revealed significant stenosis in the common femoral and superficial femoral artery on the left side.  He comes in today for endarterectomy.  He also has a popliteal stenosis that we discussed would be dealt with later if needed  Procedure:  The patient was identified in the holding area and taken to [facility] OR ROOM 11  The patient was then placed supine on the table. general anesthesia was administered.  The patient was prepped and draped in the usual sterile fashion.  A time out was called and antibiotics were administered.  A PA was necessary to expedite the procedure and assist with technical details.  She help with exposure by providing suction and retraction.  She help with the anastomosis by following the suture.  She help with wound  closure.  The patient is previous oblique incision in the left groin was identified.  I elected to make a new incision about 1 cm proximal to this.  Cautery was used divide subcutaneous tissue.  I then sharply dissected out the left limb of the aortobifemoral graft.  There was dense scar tissue.  The anastomosis was high in the incision as the patient has a early common femoral bifurcation.  For that reason I was unable to individually isolate the graft and common femoral artery.  I was able to get circumferential control of both of the vessels.  I then dissected out the superficial femoral artery for approximately 5 cm as well as the profundofemoral artery.  Once I had adequate exposure, the patient was fully heparinized.  The aortic graft was occluded with a Cooley J clamp and profunda clamps were placed on the superficial femoral and profundofemoral artery.  I used a #11 blade to make an arteriotomy in the superficial femoral artery which extends up onto the left limb of aortobifemoral graft.  The aorta bifemoral limb was patent but had sluggish flow.  There was significant mural thrombus within the common femoral artery which I removed.  I then performed a endarterectomy of the common femoral artery and the superficial femoral artery.  I went down about 3 cm on the superficial femoral artery or I found a area to truncate the plaque.  The plaque did extend distally but I got a good endpoint.  I then passed a #4 Fogarty catheter down the superficial femoral artery and evacuated some thrombus.  A negative pass was then performed.  There was excellent backbleeding from the superficial femoral artery as well as the profundofemoral artery.  I then passed a #6 Fogarty catheter of the left limb of the aortic graft and removed a large amount of mural thrombus.  After doing this there was excellent inflow.  I then made sure that all embolic debris was removed.  A dacryon patch was selected and patch angioplasty was  performed with 5-0 Prolene.  Prior to completion, the appropriate flushing maneuvers were performed and the anastomosis was completed.  Blood flow was reestablished to the left leg.  There were excellent Doppler signals in the superficial femoral and profundofemoral artery.  He also had excellent pedal signals.  I was satisfied with these results.  The heparin was reversed with 50 mg of protamine.  The wound was irrigated.  Hemostasis was achieved.  There was a 3 hematoma.  The subcutaneous tissue was then closed with additional layers of 3-0 Vicryl followed by subcuticular closure and Prevena wound VAC placement.   Disposition: To PACU stable.   Joshua [name], M.D., [facility] [place] Office: [phone] Pager:  [phone]

## 2022-11-08 NOTE — H&P (Signed)
   Patient name: Joshua [name] MRN: [id number] DOB: [birth year] Sex: male   HISTORY OF PRESENT ILLNESS:   GRASON DRAWHORN is a 67 y.o. male who is status post aortobifemoral bypass graft at [facility] many years ago for a right heel wound.  He was found on angiography and 2022 to have a high-grade profundus stenosis on the right and superficial femoral artery occlusion.  He was having rest pain and so he went to the operating room on 12/28/2020 for right iliofemoral endarterectomy with patch angioplasty.  He continued to have significant limitations in his right leg.  He had a failed attempt at angioplasty and on 04/04/2021, he underwent a right femoral to above-knee popliteal bypass graft with reverse saphenous vein   He was admitted to the hospital earlier this year with severe right leg cellulitis which has resolved.  He is now having significant left leg claudication only being able to take about 20 steps before he gets leg cramping.   He suffers from CAD, status post PCI in 2017.  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  He is a diabetic.  He is on a statin for hypercholesterolemia.  CURRENT MEDICATIONS:    Current Facility-Administered Medications  Medication Dose Route Frequency Provider Last Rate Last Admin   0.9 %  sodium chloride infusion   Intravenous Continuous Nada Libman, MD       ceFAZolin (ANCEF) IVPB 2g/100 mL premix  2 g Intravenous 30 min Pre-Op Nada Libman, MD       Chlorhexidine Gluconate Cloth 2 % PADS 6 each  6 each Topical Once Nada Libman, MD       And   Chlorhexidine Gluconate Cloth 2 % PADS 6 each  6 each Topical Once Nada Libman, MD       insulin aspart (novoLOG) injection 0-14 Units  0-14 Units Subcutaneous Q2H PRN Val Eagle, MD       lactated ringers infusion   Intravenous Continuous Mariann Barter, MD 10 mL/hr at 11/08/22 1131 New Bag at 11/08/22 1131    REVIEW OF SYSTEMS:   [X]  denotes  positive finding, [ ]  denotes negative finding Cardiac  Comments:  Chest pain or chest pressure:    Shortness of breath upon exertion:    Short of breath when lying flat:    Irregular heart rhythm:    Constitutional    Fever or chills:      PHYSICAL EXAM:   Vitals:   11/08/22 1034  BP: (!) 165/75  Pulse: (!) 44  Resp: 20  Temp: 98 F (36.7 C)  TempSrc: Oral  SpO2: 99%  Weight: 104 kg  Height: 5\' 9"  (1.753 m)    GENERAL: The patient is a well-nourished male, in no acute distress. The vital signs are documented above. CARDIOVASCULAR: There is a regular rate and rhythm. PULMONARY: Non-labored respirations   STUDIES:      MEDICAL ISSUES:   Severe left leg claudication with stenosis of the femoral anastamosis of his ABF.  We discussed left femoral endarterectomy.  Risks and benefits discussed, all questions answered  Charlena Cross, MD, FACS [facility] [phone] Pager [phone]

## 2022-11-08 NOTE — Transfer of Care (Signed)
Immediate Anesthesia Transfer of Care Note  Patient: Joshua [name]  Procedure(s) Performed: LEFT ILIOFEMORAL ENDARTERECTOMY (Left: Groin) LEFT FEMORAL ARTERY PATCH ANGIOPLASTY USING HEMASHEILD PLATINUM DACRON PATCH 0.8CM X 15.2CM (Left: Groin) THROMBECTOMY OF AORTA BIFEMORAL GRAFT (Groin) THROMBECTOMY SUPERFICIAL FEMORAL ARTERY (Left: Groin) APPLICATION OF WOUND VAC (Left: Groin)  Patient Location: PACU  Anesthesia Type:General  Level of Consciousness: awake, alert , and oriented  Airway & Oxygen Therapy: Patient Spontanous Breathing  Post-op Assessment: Report given to RN and Post -op Vital signs reviewed and stable  Post vital signs: Reviewed and stable  Last Vitals:  Vitals Value Taken Time  BP 156/74 11/08/22 1551  Temp    Pulse 57 11/08/22 1553  Resp 13 11/08/22 1553  SpO2 92 % 11/08/22 1553  Vitals shown include unfiled device data.  Last Pain:  Vitals:   11/08/22 1055  TempSrc:   PainSc: 6          Complications: There were no known notable events for this encounter.

## 2022-11-08 NOTE — Interval H&P Note (Signed)
History and Physical Interval Note:  11/08/2022 12:06 PM  Joshua [name]  has presented today for surgery, with the diagnosis of Atherosclerosis of native artery of both lower extremities with intermittent claudication.  The various methods of treatment have been discussed with the patient and family. After consideration of risks, benefits and other options for treatment, the patient has consented to  Procedure(s): LEFT ILIOFEMORAL ENDARTERECTOMY (Left) as a surgical intervention.  The patient's history has been reviewed, patient examined, no change in status, stable for surgery.  I have reviewed the patient's chart and labs.  Questions were answered to the patient's satisfaction.     [name]

## 2022-11-08 NOTE — Anesthesia Procedure Notes (Addendum)
Procedure Name: Intubation Date/Time: 11/08/2022 1:03 PM  Performed by: Sharyn Dross, CRNAPre-anesthesia Checklist: Patient identified, Emergency Drugs available, Suction available and Patient being monitored Patient Re-evaluated:Patient Re-evaluated prior to induction Oxygen Delivery Method: Circle system utilized Preoxygenation: Pre-oxygenation with 100% oxygen Induction Type: IV induction Ventilation: Oral airway inserted - appropriate to patient size and Two handed mask ventilation required Laryngoscope Size: Mac and 4 Grade View: Grade II Tube type: Oral Tube size: 7.5 mm Number of attempts: 1 Airway Equipment and Method: Stylet and Oral airway Placement Confirmation: ETT inserted through vocal cords under direct vision, positive ETCO2 and breath sounds checked- equal and bilateral Secured at: 22 cm Tube secured with: Tape Dental Injury: Teeth and Oropharynx as per pre-operative assessment

## 2022-11-09 ENCOUNTER — Encounter (HOSPITAL_COMMUNITY): Payer: Self-pay | Admitting: Surgery

## 2022-11-09 LAB — BASIC METABOLIC PANEL
Anion gap: 8 (ref 5–15)
BUN: 17 mg/dL (ref 8–23)
CO2: 22 mmol/L (ref 22–32)
Calcium: 8.2 mg/dL — ABNORMAL LOW (ref 8.9–10.3)
Chloride: 105 mmol/L (ref 98–111)
Creatinine, Ser: 1.39 mg/dL — ABNORMAL HIGH (ref 0.61–1.24)
GFR, Estimated: 56 mL/min — ABNORMAL LOW (ref >60)
Glucose, Bld: 138 mg/dL — ABNORMAL HIGH (ref 70–99)
Potassium: 4.9 mmol/L (ref 3.5–5.1)
Sodium: 135 mmol/L (ref 135–145)

## 2022-11-09 LAB — CBC
HCT: 41.1 % (ref 39.0–52.0)
Hemoglobin: 13.7 g/dL (ref 13.0–17.0)
MCH: 29.5 pg (ref 26.0–34.0)
MCHC: 33.3 g/dL (ref 30.0–36.0)
MCV: 88.4 fL (ref 80.0–100.0)
Platelets: 178 10*3/uL (ref 150–400)
RBC: 4.65 MIL/uL (ref 4.22–5.81)
RDW: 15.1 % (ref 11.5–15.5)
WBC: 15 10*3/uL — ABNORMAL HIGH (ref 4.0–10.5)
nRBC: 0 % (ref 0.0–0.2)

## 2022-11-09 LAB — LIPID PANEL
Cholesterol: 100 mg/dL (ref 0–200)
HDL: 35 mg/dL — ABNORMAL LOW (ref >40)
LDL Cholesterol: 48 mg/dL (ref 0–99)
Total CHOL/HDL Ratio: 2.9 ratio
Triglycerides: 87 mg/dL (ref <150)
VLDL: 17 mg/dL (ref 0–40)

## 2022-11-09 MED ORDER — HYDROCODONE-ACETAMINOPHEN 5-325 MG PO TABS: 1.0000 | ORAL_TABLET | Freq: Four times a day (QID) | ORAL | 0 refills | Status: AC | PRN

## 2022-11-09 NOTE — Progress Notes (Signed)
Progress Note    11/09/2022 9:07 AM 1 Day Post-Op  Subjective: Feeling great and ready to go home.  Has walked up and down the hall without pain in the left leg    Vitals:   11/09/22 0300 11/09/22 0807  BP: (!) 160/73 (!) 149/73  Pulse: 65 (!) 56  Resp: 18 12  Temp: 98 F (36.7 C) 98.6 F (37 C)  SpO2: 95% 95%    Physical Exam: General: Resting comfortably in bed Cardiac: Regular Lungs: Nonlabored Incisions: Left groin incision soft with Prevena VAC, with good seal Extremities: Brisk left DP Doppler signal  CBC    Component Value Date/Time   WBC Joshua.0 (H) 11/09/2022 0247   RBC 4.65 11/09/2022 0247   HGB 13.7 11/09/2022 0247   HGB 13.6 12/19/2020 1110   HGB 19.2 (H) 11/25/2016 1430   HCT 41.1 11/09/2022 0247   HCT 38.9 12/19/2020 1110   HCT 54.8 (H) 11/25/2016 1430   PLT 178 11/09/2022 0247   PLT 250 12/19/2020 1110   MCV 88.4 11/09/2022 0247   MCV 99 (H) 12/19/2020 1110   MCV 92.3 11/25/2016 1430   MCH 29.5 11/09/2022 0247   MCHC 33.3 11/09/2022 0247   RDW Joshua.1 11/09/2022 0247   RDW 11.8 12/19/2020 1110   RDW Joshua.0 (H) 11/25/2016 1430   LYMPHSABS 1.4 06/19/2022 0903   LYMPHSABS 2.5 11/25/2016 1430   MONOABS 0.6 06/19/2022 0903   MONOABS 1.3 (H) 11/25/2016 1430   EOSABS 0.2 06/19/2022 0903   EOSABS 0.3 11/25/2016 1430   BASOSABS 0.1 06/19/2022 0903   BASOSABS 0.0 11/25/2016 1430    BMET    Component Value Date/Time   NA 135 11/09/2022 0247   NA 137 12/19/2020 1110   NA 140 11/25/2016 1430   K 4.9 11/09/2022 0247   K 4.3 11/25/2016 1430   CL 105 11/09/2022 0247   CO2 22 11/09/2022 0247   CO2 27 11/25/2016 1430   GLUCOSE 138 (H) 11/09/2022 0247   GLUCOSE 101 11/25/2016 1430   BUN 17 11/09/2022 0247   BUN 9 12/19/2020 1110   BUN 12.6 11/25/2016 1430   CREATININE 1.39 (H) 11/09/2022 0247   CREATININE 0.92 07/09/2019 1435   CREATININE 0.9 11/25/2016 1430   CALCIUM 8.2 (L) 11/09/2022 0247   CALCIUM 10.3 11/25/2016 1430   GFRNONAA 56 (L)  11/09/2022 0247   GFRAA >60 07/09/2018 1610    INR    Component Value Date/Time   INR 0.9 08/Joshua/2024 0922     Intake/Output Summary (Last 24 hours) at 11/09/2022 2956 Last data filed at 11/09/2022 0750 Gross per 24 hour  Intake 1800 ml  Output 850 ml  Net 950 ml      Assessment/Plan:  67 y.o. Joshua [name] is 1 day postop, s/p:  #1: Redo left femoral artery exposure #2: Thrombectomy of left superficial femoral artery, and left limb of aortobifemoral bypass graft #3: Left common femoral and superficial femoral endarterectomy with dacryon patch angioplasty #4: Prevena wound VAC placement   -Feeling great this morning.  Has minimal soreness in the left groin.  Has walked up and down the halls without claudication in the left leg -Left groin incision with Prevena VAC with good seal.  Groin is soft without hematoma -Left lower extremity warm and well-perfused with brisk DP Doppler signal -Hemodynamically stable with hemoglobin at 13.7 -Continue aspirin and statin -Will discharge home today.  He will follow-up with our office in 2 to 3 weeks for incision check.  He understands to remove  his Prevena VAC at home in about 7 days   Loel Dubonnet, [place] [facility] [phone] 11/09/2022 9:07 AM

## 2022-11-09 NOTE — Discharge Instructions (Signed)
[facility]  Discharge instructions  Lower Extremity Surgery  Please refer to the following instruction for your post-procedure care. Your surgeon or physician assistant will discuss any changes with you.  Activity  You are encouraged to walk as much as you can. You can slowly return to normal activities during the month after your surgery. Avoid strenuous activity and heavy lifting until your doctor tells you it's OK. Avoid activities such as vacuuming or swinging a golf club. Do not drive until your doctor give the OK and you are no longer taking prescription pain medications. It is also normal to have difficulty with sleep habits, eating and bowel movement after surgery. These will go away with time.  Incision Care  Clean your incision with mild soap and water. Shower every day (once your prevena vac is removed). Pat the area dry with a clean towel. Do not apply any ointments or creams to your incision. If you have open wounds you will be instructed how to care for them or a visiting nurse may be arranged for you. If you have staples or sutures along your incision they will be removed at your post-op appointment. You may have skin glue on your incision. Do not peel it off. It will come off on its own in about one week.  Keep Pravena wound vac on your groin incision until it loses it seal in about 7-10 days.  Once that happens, you can remove and then wash the groin wound with soap and water daily and pat dry. (No tub bath-only shower)  Then put a dry gauze or washcloth in the groin to keep this area dry to help prevent wound infection.  Do this daily and as needed.  Do not use Vaseline or neosporin on your incisions.  Only use soap and water on your incisions and then protect and keep dry.   Diet  Resume your normal diet. There are no special food restrictions following this procedure. A low fat/ low cholesterol diet is recommended for all patients with  vascular disease. In order to heal from your surgery, it is CRITICAL to get adequate nutrition. Your body requires vitamins, minerals, and protein. Vegetables are the best source of vitamins and minerals. Vegetables also provide the perfect balance of protein. Processed food has little nutritional value, so try to avoid this.  Medications  Resume taking all your medications unless your doctor or physician assistant tells you not to. If your incision is causing pain, you may take over-the-counter pain relievers such as acetaminophen (Tylenol). If you were prescribed a stronger pain medication, please aware these medication can cause nausea and constipation. Prevent nausea by taking the medication with a snack or meal. Avoid constipation by drinking plenty of fluids and eating foods with high amount of fiber, such as fruits, vegetables, and grains. Take Colace 100 mg (an over-the-counter stool softener) twice a day as needed for constipation.  Do not take Tylenol if you are taking prescription pain medications.  Follow Up  Our office will schedule a follow up appointment 2-3 weeks following discharge.  Please call us immediately for any of the following conditions  Severe or worsening pain in your legs or feet while at rest or while walking  Increased pain, redness, warmth, or drainage (pus) from your incision site(s) - Fever of 101 degree or higher   Reduce your risk of vascular disease  Stop smoking. If you would like help call [facility] at 1-800-QUIT-NOW (450-262-9647) or [facility] at 308-735-3208.  Manage your cholesterol Maintain a desired weight Control your diabetes weight Control your diabetes Keep your blood pressure down  If you have any questions, please call the office at [phone]

## 2022-11-11 LAB — POCT ACTIVATED CLOTTING TIME: Activated Clotting Time: 256 s

## 2022-11-11 NOTE — Anesthesia Postprocedure Evaluation (Signed)
Anesthesia Post Note  Patient: Joshua [name]  Procedure(s) Performed: LEFT ILIOFEMORAL ENDARTERECTOMY (Left: Groin) LEFT FEMORAL ARTERY PATCH ANGIOPLASTY USING HEMASHEILD PLATINUM DACRON PATCH 0.8CM X 15.2CM (Left: Groin) THROMBECTOMY OF AORTA BIFEMORAL GRAFT (Groin) THROMBECTOMY SUPERFICIAL FEMORAL ARTERY (Left: Groin) APPLICATION OF WOUND VAC (Left: Groin)     Patient location during evaluation: PACU Anesthesia Type: General Level of consciousness: awake Pain management: pain level controlled Vital Signs Assessment: post-procedure vital signs reviewed and stable Respiratory status: spontaneous breathing, nonlabored ventilation and respiratory function stable Cardiovascular status: blood pressure returned to baseline and stable Postop Assessment: no apparent nausea or vomiting Anesthetic complications: no   There were no known notable events for this encounter.  Last Vitals:  Vitals:   11/09/22 0300 11/09/22 0807  BP: (!) 160/73 (!) 149/73  Pulse: 65 (!) 56  Resp: 18 12  Temp: 36.7 C 37 C  SpO2: 95% 95%    Last Pain:  Vitals:   11/09/22 0807  TempSrc: Oral  PainSc:                  [name]

## 2022-11-12 NOTE — Discharge Summary (Signed)
Discharge Summary Patient ID: Joshua [name] [id number] 66 y.o. [birth year]  Admit date: 11/08/2022  Discharge date and time: 11/09/2022 10:10 AM   Admitting Physician: Nada Libman, MD   Discharge Physician: Nada Libman, MD   Admission Diagnoses: Claudication [facility]) [I73.9] PAD (peripheral artery disease) [facility]) [I73.9]  Discharge Diagnoses: Claudication (HCC) [I73.9] PAD (peripheral artery disease) (HCC) [I73.9]  Admission Condition: good  Discharged Condition: good  Indication for Admission: This is a 67 year old gentleman with severe left leg claudication. Angiography revealed significant stenosis in the common femoral and superficial femoral artery on the left side. He comes in today for endarterectomy. He also has a popliteal stenosis that we discussed would be dealt with later if needed   Hospital Course: The patient was admitted to the hospital in 11/08/2022 and underwent the following surgery: #1: Redo left femoral artery exposure, #2: Thrombectomy of left superficial femoral artery, and left limb of aortobifemoral bypass graft, #3: Left common femoral and superficial femoral endarterectomy with dacryon patch angioplasty, and #4: Prevena wound VAC placement.  He tolerated the procedure well and was transferred to the PACU in stable condition.  POD 1 the patient's pain was well-controlled with oral pain meds.  He was hemodynamically stable with a postop hemoglobin of 13.7.  He was tolerating a normal diet without difficulty swallowing.  He was voiding without difficulty.  He was able to ambulate up and down the hallway without any claudication.  His left groin incision site was soft with a Prevena VAC with good seal.  Consults: None  Treatments: IV hydration, antibiotics: Ancef, anticoagulation: subcutaneous heparin, and surgery: #1: Redo left femoral artery exposure, #2: Thrombectomy of left superficial femoral artery, and left limb of aortobifemoral bypass graft, #3:  Left common femoral and superficial femoral endarterectomy with dacryon patch angioplasty, and #4: Prevena wound VAC placement    Disposition: Discharge disposition: 01-Home or Self Care       - For [facility] Registry use ---  Post-op:  Wound infection: No  Graft infection: No  Transfusion: No  If yes, 0 units given New Arrhythmia: No Patency judged by: [x ] Dopper only, [ ]  Palpable graft pulse, [ ]  Palpable distal pulse, [ ]  ABI inc. > 0.15, [ ]  Duplex D/C Ambulatory Status: Ambulatory  Complications: MI: [ x] No, [ ]  Troponin only, [ ]  EKG or Clinical CHF: No Resp failure: [x ] none, [ ]  Pneumonia, [ ]  Ventilator Chg in renal function: [x ] none, [ ]  Inc. Cr > 0.5, [ ]  Temp. Dialysis, [ ]  Permanent dialysis Stroke: [x ] None, [ ]  Minor, [ ]  Major Return to OR: No  Reason for return to OR: [ ]  Bleeding, [ ]  Infection, [ ]  Thrombosis, [ ]  Revision  Discharge medications: Statin use:  Yes ASA use:  Yes Plavix use:  No  for medical reason not medically necessary Beta blocker use: Yes    Patient Instructions:  Allergies as of 11/09/2022       Reactions   Zestril [lisinopril] Hives, Itching   Contrast Media [iodinated Contrast Media] Hives, Itching   OK if pt has Benadryl pre-med.   Elavil [amitriptyline] Nausea Only, Other (See Comments)   Makes pt "feel weird"   Gadolinium Derivatives Hives, Itching   Motrin [ibuprofen] Other (See Comments)   Internal bleeding        Medication List     TAKE these medications    albuterol (2.5 MG/3ML) 0.083% nebulizer solution Commonly known as: PROVENTIL Take 3  mLs (2.5 mg total) by nebulization every 6 (six) hours as needed for wheezing or shortness of breath. What changed: Another medication with the same name was changed. Make sure you understand how and when to take each.   albuterol 108 (90 Base) MCG/ACT inhaler Commonly known as: ProAir HFA USE 2 PUFFS EVERY 6 HOURS  AS NEEDED FOR WHEEZING OR  SHORTNESS OF BREATH What  changed:  how much to take how to take this when to take this reasons to take this additional instructions   aspirin EC 81 MG tablet Take 81 mg by mouth daily.   carvedilol 12.5 MG tablet Commonly known as: COREG TAKE 1 TABLET BY MOUTH  TWICE DAILY WITH MEALS   colchicine 0.6 MG tablet TAKE 2 TABLETS BY MOUTH AS  DIRECTED THEN 1 TABLET  AFTER 1 HOUR. CAN TAKE 1  TABLET TWICE DAILY  THEREAFTER.   ezetimibe 10 MG tablet Commonly known as: ZETIA TAKE 1 TABLET BY MOUTH DAILY   HYDROcodone-acetaminophen 5-325 MG tablet Commonly known as: Norco Take 1 tablet by mouth every 6 (six) hours as needed for moderate pain. What changed: when to take this   omeprazole 40 MG capsule Commonly known as: PRILOSEC TAKE 1 CAPSULE BY MOUTH DAILY   pregabalin 100 MG capsule Commonly known as: LYRICA Take 100 mg by mouth 3 (three) times daily.   Primatene Mist 0.125 MG/ACT Aero Generic drug: EPINEPHrine Inhale 1 puff into the lungs daily as needed (COPD flare).   rosuvastatin 40 MG tablet Commonly known as: CRESTOR TAKE 1 TABLET BY MOUTH DAILY   traZODone 100 MG tablet Commonly known as: DESYREL TAKE 1 TABLET BY MOUTH AT  BEDTIME What changed:  when to take this reasons to take this   Trelegy Ellipta 200-62.5-25 MCG/ACT Aepb Generic drug: Fluticasone-Umeclidin-Vilant Inhale 1 puff into the lungs every evening.   Vitamin D 50 MCG (2000 UT) tablet Take 2,000 Units by mouth daily.               Discharge Care Instructions  (From admission, onward)           Start     Ordered   11/09/22 0000  Discharge wound care:       Comments: Remove Prevena VAC from left groin in about 7 days.  It should peel off like a bandage.  After the [facility] is removed, clean the left groin incision daily with antibacterial soap and water.  Pat the area dry after cleaning.  Keep a clean and dry gauze in the groin.  Do not put any ointments or creams on your incision   11/09/22 0933            Activity: activity as tolerated, no driving while on analgesics, and no heavy lifting for 4 weeks Diet: low fat, low cholesterol diet Wound Care:  remove the prevena vac from your left groin about 7 days postop, then keep your incision clean and dry  Follow-up with [facility] in 2 weeks.  SignedLoel Dubonnet, PA-C 11/12/2022 9:26 PM

## 2022-11-25 ENCOUNTER — Ambulatory Visit (INDEPENDENT_AMBULATORY_CARE_PROVIDER_SITE_OTHER): Payer: Medicare Other | Admitting: Physician Assistant

## 2022-11-25 VITALS — BP 182/88 | HR 49 | Temp 98.6°F | Resp 18 | Ht 69.0 in | Wt 238.0 lb

## 2022-11-25 DIAGNOSIS — I70213 Atherosclerosis of native arteries of extremities with intermittent claudication, bilateral legs: Secondary | ICD-10-CM

## 2022-11-25 DIAGNOSIS — I739 Peripheral vascular disease, unspecified: Secondary | ICD-10-CM

## 2022-11-25 NOTE — Progress Notes (Signed)
POST OPERATIVE OFFICE NOTE    CC:  F/u for surgery  HPI:  This is a 67 y.o. male who is s/p thrombectomy of left limb of aortobifemoral bypass and left SFA with left common femoral artery endarterectomy and dacryon patch angioplasty by Dr. Myra Gianotti on 11/08/2022 due to left leg claudication and graft stenosis.  Patient states claudication symptoms have completely resolved since surgery.  He believes his groin incision has completely healed.  He is taking his aspirin and statin daily.  Allergies  Allergen Reactions   Zestril [Lisinopril] Hives and Itching   Contrast Media [Iodinated Contrast Media] Hives and Itching    OK if pt has Benadryl pre-med.   Elavil [Amitriptyline] Nausea Only and Other (See Comments)    Makes pt "feel weird"   Gadolinium Derivatives Hives and Itching   Motrin [Ibuprofen] Other (See Comments)    Internal bleeding    Current Outpatient Medications  Medication Sig Dispense Refill   albuterol (PROAIR HFA) 108 (90 Base) MCG/ACT inhaler USE 2 PUFFS EVERY 6 HOURS  AS NEEDED FOR WHEEZING OR  SHORTNESS OF BREATH (Patient taking differently: Inhale 2 puffs into the lungs every 6 (six) hours as needed for wheezing or shortness of breath.) 34 g 3   albuterol (PROVENTIL) (2.5 MG/3ML) 0.083% nebulizer solution Take 3 mLs (2.5 mg total) by nebulization every 6 (six) hours as needed for wheezing or shortness of breath. 75 mL 5   aspirin EC 81 MG tablet Take 81 mg by mouth daily.     carvedilol (COREG) 12.5 MG tablet TAKE 1 TABLET BY MOUTH  TWICE DAILY WITH MEALS 60 tablet 0   Cholecalciferol (VITAMIN D) 50 MCG (2000 UT) tablet Take 2,000 Units by mouth daily.     colchicine 0.6 MG tablet TAKE 2 TABLETS BY MOUTH AS  DIRECTED THEN 1 TABLET  AFTER 1 HOUR. CAN TAKE 1  TABLET TWICE DAILY  THEREAFTER. 60 tablet 11   EPINEPHrine (PRIMATENE MIST) 0.125 MG/ACT AERO Inhale 1 puff into the lungs daily as needed (COPD flare).     ezetimibe (ZETIA) 10 MG tablet TAKE 1 TABLET BY MOUTH DAILY  100 tablet 0   Fluticasone-Umeclidin-Vilant (TRELEGY ELLIPTA) 200-62.5-25 MCG/ACT AEPB Inhale 1 puff into the lungs every evening.     HYDROcodone-acetaminophen (NORCO) 5-325 MG tablet Take 1 tablet by mouth every 6 (six) hours as needed for moderate pain. 15 tablet 0   omeprazole (PRILOSEC) 40 MG capsule TAKE 1 CAPSULE BY MOUTH DAILY 90 capsule 0   pregabalin (LYRICA) 100 MG capsule Take 100 mg by mouth 3 (three) times daily.     rosuvastatin (CRESTOR) 40 MG tablet TAKE 1 TABLET BY MOUTH DAILY 30 tablet 11   traZODone (DESYREL) 100 MG tablet TAKE 1 TABLET BY MOUTH AT  BEDTIME (Patient taking differently: Take 100 mg by mouth at bedtime as needed for sleep.) 90 tablet 2   No current facility-administered medications for this visit.     ROS:  See HPI  Physical Exam:  Vitals:   11/25/22 0845 11/25/22 0907  BP: (!) 202/85 (!) 182/88  Pulse: (!) 49   Resp: 18   Temp: 98.6 F (37 C)   TempSrc: Temporal   SpO2: (!) 87%   Weight: 238 lb (108 kg)   Height: 5\' 9"  (1.753 m)     Incision: Left groin incision completely healed Extremities: Palpable left DP pulse Neuro: A&O Abdomen:  soft  Assessment/Plan:  This is a 67 y.o. male who is s/p: Thrombectomy of left  limb of aortobifemoral bypass graft and SFA with left common femoral artery endarterectomy  -Left leg is well-perfused with palpable DP pulse.  He completely healed his left groin incision.  Claudication symptoms have completely resolved since surgery.  No restrictions from our standpoint at this time.  He will return in about 4 to 6 weeks at which time we will check bilateral lower extremity arterial duplex and ABIs.  He knows to call/return office sooner with any questions or concerns.   Emilie Rutter, PA-C [facility] [phone]  Clinic MD:  Myra Gianotti

## 2022-12-03 ENCOUNTER — Other Ambulatory Visit: Payer: Self-pay

## 2022-12-03 DIAGNOSIS — I739 Peripheral vascular disease, unspecified: Secondary | ICD-10-CM

## 2023-01-13 ENCOUNTER — Ambulatory Visit (INDEPENDENT_AMBULATORY_CARE_PROVIDER_SITE_OTHER)
Admission: RE | Admit: 2023-01-13 | Discharge: 2023-01-13 | Disposition: A | Discharge status: Home / Self Care | Payer: Medicare Other | Source: Ambulatory Visit | Attending: Surgery | Admitting: Surgery

## 2023-01-13 ENCOUNTER — Ambulatory Visit: Payer: Medicare Other | Admitting: Physician Assistant

## 2023-01-13 ENCOUNTER — Other Ambulatory Visit: Payer: Self-pay | Admitting: Surgery

## 2023-01-13 ENCOUNTER — Ambulatory Visit (HOSPITAL_COMMUNITY)
Admission: RE | Admit: 2023-01-13 | Discharge: 2023-01-13 | Disposition: A | Discharge status: Home / Self Care | Payer: Medicare Other | Source: Ambulatory Visit | Attending: Surgery | Admitting: Surgery

## 2023-01-13 VITALS — BP 190/99 | HR 79 | Temp 97.4°F | Ht 69.0 in | Wt 236.3 lb

## 2023-01-13 DIAGNOSIS — I739 Peripheral vascular disease, unspecified: Secondary | ICD-10-CM

## 2023-01-13 DIAGNOSIS — I70213 Atherosclerosis of native arteries of extremities with intermittent claudication, bilateral legs: Secondary | ICD-10-CM | POA: Diagnosis not present

## 2023-01-13 LAB — VAS US ABI WITH/WO TBI
Left ABI: 1.01
Right ABI: 1.18

## 2023-01-13 NOTE — Progress Notes (Unsigned)
[facility] [place] HISTORY AND PHYSICAL   History of Present Illness:  Patient is a 67 y.o. year old male who presents for evaluation of PAD with history of aortobifemoral bypass graft at [facility] many years ago for a right heel wound. He was found on angiography and 2022 to have a high-grade profundus stenosis on the right and superficial femoral artery occlusion. He was having rest pain and so he went to the operating room on 12/28/2020 for right iliofemoral endarterectomy with patch angioplasty. He continued to have significant limitations in his right leg. He had a failed attempt at angioplasty and on 04/04/2021, he underwent a right femoral to above-knee popliteal bypass graft with reverse saphenous vein.  Past Medical History:  Diagnosis Date   AC (acromioclavicular) joint bone spurs, unspecified laterality 11/25/2016   Arthritis    "back" (05/16/2015)   CAD in native artery, occluded Lcx and rotational atherectomy to LAD with DES 05/17/15 05/18/2015   Cavitary lesion of lung 02/25/2018   Chronic back pain    "all my back"   Chronic bronchitis (HCC)    COPD (chronic obstructive pulmonary disease) (HCC)    Diabetes mellitus without complication (HCC)    pt states he is no longer diabetic, no medications   GERD (gastroesophageal reflux disease)    H/O blood clots    s/p IVC filter ~ 2010: per IR 12/17/16, would not recommended retrieval unless filterl causes new concerns before one of my neck ORs"   History of gout    Hyperlipidemia    Hypertension    Lung cancer (HCC) 02/25/2018   bronchoscopy, RUL + non-small cell carcinoma   Neuromuscular disorder (HCC)    PAD (peripheral artery disease) (HCC)    Phlebitis    Positive TB test    Seasonal allergies    Shortness of breath    Spinal disease     Past Surgical History:  Procedure Laterality Date   ABDOMINAL AORTOGRAM W/LOWER EXTREMITY  01/07/2018   ABDOMINAL AORTOGRAM W/LOWER EXTREMITY N/A 01/07/2018   Procedure:  ABDOMINAL AORTOGRAM W/LOWER EXTREMITY;  Surgeon: Iran Ouch, MD;  [facility]: [facility];  Service: Cardiovascular;  Laterality: N/A;   ABDOMINAL AORTOGRAM W/LOWER EXTREMITY N/A 12/27/2020   Procedure: ABDOMINAL AORTOGRAM W/LOWER EXTREMITY;  Surgeon: Iran Ouch, MD;  Location: [facility];  Service: Cardiovascular;  Laterality: N/A;   ABDOMINAL AORTOGRAM W/LOWER EXTREMITY N/A 04/03/2021   Procedure: ABDOMINAL AORTOGRAM W/LOWER EXTREMITY;  Surgeon: Nada Libman, MD;  Location: [facility];  Service: Cardiovascular;  Laterality: N/A;   ABDOMINAL AORTOGRAM W/LOWER EXTREMITY N/A 10/01/2022   Procedure: ABDOMINAL AORTOGRAM W/LOWER EXTREMITY;  Surgeon: Nada Libman, MD;  Location: [facility];  Service: Cardiovascular;  Laterality: N/A;   ANTERIOR CERVICAL DECOMP/DISCECTOMY FUSION  ~ 2001-2009 X 3   ANTERIOR CERVICAL DECOMP/DISCECTOMY FUSION N/A 10/06/2012   Procedure: ANTERIOR CERVICAL DECOMPRESSION/DISCECTOMY FUSION 1 LEVEL;  Surgeon: Reinaldo Meeker, MD;  Location: [facility];  Service: Neurosurgery;  Laterality: N/A;  C7T1 anterior cervical decompression with fusion plating and bonegraft    APPLICATION OF WOUND VAC Left 11/08/2022   Procedure: APPLICATION OF WOUND VAC;  Surgeon: Nada Libman, MD;  Location: [facility] OR;  Service: Vascular;  Laterality: Left;   BACK SURGERY     BIOPSY  11/21/2020   Procedure: BIOPSY;  Surgeon: Napoleon Form, MD;  Location: [facility];  Service: Endoscopy;;   CARDIAC CATHETERIZATION  05/16/2015   CARDIAC CATHETERIZATION N/A  05/16/2015   Procedure: Left Heart Cath and Coronary Angiography;  Surgeon: Marykay Lex, MD;  Location: [facility] INVASIVE CV LAB;  Service: Cardiovascular;  Laterality: N/A;   CARDIAC CATHETERIZATION N/A 05/16/2015   Procedure: Coronary Balloon Angioplasty;  Surgeon: Marykay Lex, MD;  Location: [facility] INVASIVE CV LAB;  Service: Cardiovascular;  Laterality: N/A;   CARDIAC CATHETERIZATION N/A 05/17/2015    Procedure: Coronary Stent Intervention Rotoblater;  Surgeon: Peter M Swaziland, MD;  Location: [facility] INVASIVE CV LAB;  Service: Cardiovascular;  Laterality: N/A;   COLONOSCOPY WITH PROPOFOL N/A 11/21/2020   Procedure: COLONOSCOPY WITH PROPOFOL;  Surgeon: Napoleon Form, MD;  Location: [facility];  Service: Endoscopy;  Laterality: N/A;   ELBOW SURGERY Left 2016   "had bone pieces removed"   ENDARTERECTOMY Left 11/08/2022   Procedure: LEFT ILIOFEMORAL ENDARTERECTOMY;  Surgeon: Nada Libman, MD;  Location: [facility] OR;  Service: Vascular;  Laterality: Left;   ENDARTERECTOMY FEMORAL Right 12/28/2020   Procedure: RIGHT FEMORAL ARTERY ENDARTERECTOMY;  Surgeon: Nada Libman, MD;  [facility];  Service: Vascular;  Laterality: Right;   ESOPHAGEAL MANOMETRY N/A 12/30/2016   Procedure: ESOPHAGEAL MANOMETRY (EM);  Surgeon: Hilarie Fredrickson, MD;  Location: [facility];  Service: Endoscopy;  Laterality: N/A;   ESOPHAGOGASTRODUODENOSCOPY (EGD) WITH PROPOFOL N/A 11/21/2020   Procedure: ESOPHAGOGASTRODUODENOSCOPY (EGD) WITH PROPOFOL;  Surgeon: Napoleon Form, MD;  Location: [facility];  Service: Endoscopy;  Laterality: N/A;   EXCISIONAL HEMORRHOIDECTOMY     FEMORAL BYPASS  ~ 2010   FEMORAL-POPLITEAL BYPASS GRAFT Right 04/04/2021   Procedure: RIGHT FEMORAL-POPLITEAL ARTERY BYPASS GRAFT;  Surgeon: Nada Libman, MD;  Location: [facility] OR;  Service: Vascular;  Laterality: Right;   IR RADIOLOGIST EVAL & MGMT  12/17/2016   IVC FILTER PLACEMENT ([facility] HX)  2010   "had blood clots in my back"   NECK SURGERY  2014   2008-2014   [facility] ANGIOPLASTY Right 12/28/2020   Procedure: PATCH ANGIOPLASTY USING;  Surgeon: Nada Libman, MD;  Location: [facility] OR;  Service: Vascular;  Laterality: Right;   PATCH ANGIOPLASTY Left 11/08/2022   Procedure: LEFT FEMORAL ARTERY PATCH ANGIOPLASTY USING HEMASHEILD PLATINUM DACRON PATCH 0.8CM X 15.2CM;  Surgeon: Nada Libman, MD;  Location: [facility] OR;  Service: Vascular;  Laterality:  Left;   PERIPHERAL VASCULAR ATHERECTOMY  01/07/2018   Procedure: PERIPHERAL VASCULAR ATHERECTOMY;  Surgeon: Iran Ouch, MD;  [facility]: [facility];  Service: Cardiovascular;;   PERIPHERAL VASCULAR BALLOON ANGIOPLASTY  01/07/2018   Procedure: PERIPHERAL VASCULAR BALLOON ANGIOPLASTY;  Surgeon: Iran Ouch, MD;  [facility]: [facility];  Service: Cardiovascular;;   PERIPHERAL VASCULAR CATHETERIZATION N/A 02/14/2016   Procedure: Abdominal Aortogram w/Lower Extremity;  Surgeon: Iran Ouch, MD;  [facility]: [facility];  Service: Cardiovascular;  Laterality: N/A;   PERIPHERAL VASCULAR INTERVENTION Right 04/03/2021   Procedure: PERIPHERAL VASCULAR INTERVENTION;  Surgeon: Nada Libman, MD;  [facility];  Service: Cardiovascular;  Laterality: Right;   POLYPECTOMY  11/21/2020   Procedure: POLYPECTOMY;  Surgeon: Napoleon Form, MD;  Location: [facility];  Service: Endoscopy;;   POSTERIOR LAMINECTOMY / DECOMPRESSION LUMBAR SPINE  2006   "bone spurs"   THROMBECTOMY FEMORAL ARTERY Left 11/08/2022   Procedure: THROMBECTOMY SUPERFICIAL FEMORAL ARTERY;  Surgeon: Nada Libman, MD;  [facility]: [facility];  Service: Vascular;  Laterality: Left;   THROMBECTOMY ILIAC ARTERY N/A 11/08/2022   Procedure: THROMBECTOMY OF AORTA BIFEMORAL GRAFT;  Surgeon: Nada Libman, MD;  Location: [facility];  Service: Vascular;  Laterality: N/A;   VIDEO BRONCHOSCOPY WITH ENDOBRONCHIAL NAVIGATION Right 02/25/2018   Procedure: VIDEO BRONCHOSCOPY WITH ENDOBRONCHIAL NAVIGATION;  Surgeon: Josephine Igo, DO;  Location: [facility] OR;  Service: Thoracic;  Laterality: Right;   VIDEO BRONCHOSCOPY WITH ENDOBRONCHIAL ULTRASOUND Right 02/25/2018   Procedure: VIDEO BRONCHOSCOPY WITH ENDOBRONCHIAL ULTRASOUND;  Surgeon: Josephine Igo, DO;  Location: [facility] OR;  Service: Thoracic;  Laterality: Right;    ROS:   General:  No weight loss, Fever, chills  HEENT: No recent headaches, no nasal bleeding,  no visual changes, no sore throat  Neurologic: No dizziness, blackouts, seizures. No recent symptoms of stroke or mini- stroke. No recent episodes of slurred speech, or temporary blindness.  Cardiac: No recent episodes of chest pain/pressure, no shortness of breath at rest.  No shortness of breath with exertion.  Denies history of atrial fibrillation or irregular heartbeat  Vascular: No history of rest pain in feet.  No history of claudication.  No history of non-healing ulcer, No history of DVT   Pulmonary: No home oxygen, no productive cough, no hemoptysis,  No asthma or wheezing  Musculoskeletal:  [ ]  Arthritis, [ ]  Low back pain,  [ ]  Joint pain  Hematologic:No history of hypercoagulable state.  No history of easy bleeding.  No history of anemia  Gastrointestinal: No hematochezia or melena,  No gastroesophageal reflux, no trouble swallowing  Urinary: [ ]  chronic Kidney disease, [ ]  on HD - [ ]  MWF or [ ]  TTHS, [ ]  Burning with urination, [ ]  Frequent urination, [ ]  Difficulty urinating;   Skin: No rashes  Psychological: No history of anxiety,  No history of depression  Social History Social History   Tobacco Use   Smoking status: Some Days    Current packs/day: 0.00    Average packs/day: 0.5 packs/day for 49.0 years (24.5 ttl pk-yrs)    Types: Cigarettes    Start date: 02/10/1972    Last attempt to quit: 02/09/2021    Years since quitting: 1.9    Passive exposure: Current (Wife is a Smoker)   Smokeless tobacco: Never   Tobacco comments:    half a pack a day   Vaping Use   Vaping status: Never Used  Substance Use Topics   Alcohol use: Not Currently   Drug use: No    Family History Family History  Problem Relation Age of Onset   Stomach cancer Mother        Deceased, 62s   Hypertension Mother    Hypertension Father    Stroke Father    Heart attack Father        Deceased, 15   Aneurysm Brother    Healthy Daughter    Healthy Maternal Grandmother    Leukemia  Grandchild    Diabetes Neg Hx    Early death Neg Hx    Colon cancer Neg Hx    Pancreatic cancer Neg Hx     Allergies  Allergies  Allergen Reactions   Zestril [Lisinopril] Hives and Itching   Contrast Media [Iodinated Contrast Media] Hives and Itching    OK if pt has Benadryl pre-med.   Elavil [Amitriptyline] Nausea Only and Other (See Comments)    Makes pt "feel weird"   Gadolinium Derivatives Hives and Itching   Motrin [Ibuprofen] Other (See Comments)    Internal bleeding     Current Outpatient Medications  Medication Sig Dispense Refill   albuterol (PROAIR HFA) 108 (90 Base) MCG/ACT inhaler  USE 2 PUFFS EVERY 6 HOURS  AS NEEDED FOR WHEEZING OR  SHORTNESS OF BREATH (Patient taking differently: Inhale 2 puffs into the lungs every 6 (six) hours as needed for wheezing or shortness of breath.) 34 g 3   albuterol (PROVENTIL) (2.5 MG/3ML) 0.083% nebulizer solution Take 3 mLs (2.5 mg total) by nebulization every 6 (six) hours as needed for wheezing or shortness of breath. 75 mL 5   aspirin EC 81 MG tablet Take 81 mg by mouth daily.     carvedilol (COREG) 12.5 MG tablet TAKE 1 TABLET BY MOUTH  TWICE DAILY WITH MEALS 60 tablet 0   Cholecalciferol (VITAMIN D) 50 MCG (2000 UT) tablet Take 2,000 Units by mouth daily.     colchicine 0.6 MG tablet TAKE 2 TABLETS BY MOUTH AS  DIRECTED THEN 1 TABLET  AFTER 1 HOUR. CAN TAKE 1  TABLET TWICE DAILY  THEREAFTER. 60 tablet 11   EPINEPHrine (PRIMATENE MIST) 0.125 MG/ACT AERO Inhale 1 puff into the lungs daily as needed (COPD flare).     ezetimibe (ZETIA) 10 MG tablet TAKE 1 TABLET BY MOUTH DAILY 100 tablet 0   Fluticasone-Umeclidin-Vilant (TRELEGY ELLIPTA) 200-62.5-25 MCG/ACT AEPB Inhale 1 puff into the lungs every evening.     HYDROcodone-acetaminophen (NORCO) 5-325 MG tablet Take 1 tablet by mouth every 6 (six) hours as needed for moderate pain. 15 tablet 0   omeprazole (PRILOSEC) 40 MG capsule TAKE 1 CAPSULE BY MOUTH DAILY 90 capsule 0   pregabalin  (LYRICA) 100 MG capsule Take 100 mg by mouth 3 (three) times daily.     rosuvastatin (CRESTOR) 40 MG tablet TAKE 1 TABLET BY MOUTH DAILY 30 tablet 11   traZODone (DESYREL) 100 MG tablet TAKE 1 TABLET BY MOUTH AT  BEDTIME (Patient taking differently: Take 100 mg by mouth at bedtime as needed for sleep.) 90 tablet 2   No current facility-administered medications for this visit.    Physical Examination  Vitals:   01/13/23 1317  BP: (!) 190/99  Pulse: 79  Temp: (!) 97.4 F (36.3 C)  SpO2: 96%  Weight: 236 lb 4.8 oz (107.2 kg)  Height: 5\' 9"  (1.753 m)    Body mass index is 34.9 kg/m.  General:  Alert and oriented, no acute distress HEENT: Normal Neck: No bruit or JVD Pulmonary: Clear to auscultation bilaterally Cardiac: Regular Rate and Rhythm without murmur Abdomen: Soft, non-tender, non-distended, no mass, no scars Skin: No rash Extremity Pulses:  2+ radial, brachial, femoral, dorsalis pedis, posterior tibial pulses bilaterally Musculoskeletal: No deformity or edema  Neurologic: Upper and lower extremity motor 5/5 and symmetric  DATA: ***   ASSESSMENT: ***   PLAN: ***   Mosetta Pigeon PA-C [facility] [place] Office: [phone]  MD in clinic [place]

## 2023-01-14 ENCOUNTER — Encounter: Payer: Self-pay | Admitting: Physician Assistant

## 2023-01-16 ENCOUNTER — Encounter: Payer: Self-pay | Admitting: Cardiovascular Disease

## 2023-01-16 ENCOUNTER — Telehealth: Payer: Self-pay | Admitting: Cardiovascular Disease

## 2023-01-16 NOTE — Telephone Encounter (Signed)
Left message for patient to call back  

## 2023-01-16 NOTE — Telephone Encounter (Signed)
Patient's wife is following up to provide updated readings:  11:00a: 160/101 12:00p: 147/99 2:00p: 151/82  Wife mentions that patient still has a headache.

## 2023-01-16 NOTE — Telephone Encounter (Signed)
Spoke with pt's wife, Darel Hong (ok per DPR) regarding some more recent blood pressure readings. Wife states that pt still feels about the same, still experiencing a bit of a headache and is currently laying down to rest. I advised to monitor him overnight and take it easy and we would see them with Irving Burton, NP tomorrow at 2:45pm. Wife verbalizes understanding.

## 2023-01-16 NOTE — Telephone Encounter (Signed)
Pt c/o BP issue: STAT if pt c/o blurred vision, one-sided weakness or slurred speech  1. What are your last 5 BP readings? 187/97 Today, 190/80 Monday  2. Are you having any other symptoms (ex. Dizziness, headache, blurred vision, passed out)? Terrible headache for about a week,   3. What is your BP issue? High blood pressure and terrible headaches- wanted to be seen made an appointment for Ttuesday(01-21-23) with Samara Deist- please call to evaluate

## 2023-01-16 NOTE — Telephone Encounter (Signed)
Spoke with the patient's wife who states that the patient's blood pressure has been elevated and he has been complaining of headaches. She states that his blood pressure this morning was 181/97. This was soon after taking his morning dose of carvedilol. Patient is currently laying down in bed. She reports his blood pressure was also elevated when he went to see [facility] earlier this week, 190/99. He has been complaining of headaches for a couple of weeks now. She is not sure what his heart rate has been. Patient has been scheduled to see Bernadene Person, NP tomorrow 11/1. She is going to repeat his blood pressure in a couple of hours once he gets up and let us know if it still elevated.

## 2023-01-16 NOTE — Telephone Encounter (Signed)
Pt returning call

## 2023-01-16 NOTE — Telephone Encounter (Signed)
Error

## 2023-01-17 ENCOUNTER — Other Ambulatory Visit: Payer: Self-pay

## 2023-01-17 ENCOUNTER — Encounter: Payer: Self-pay | Admitting: Nurse Practitioner

## 2023-01-17 ENCOUNTER — Ambulatory Visit: Payer: Medicare Other | Attending: Nurse Practitioner | Admitting: Nurse Practitioner

## 2023-01-17 VITALS — BP 120/80 | HR 48 | Ht 69.0 in | Wt 235.0 lb

## 2023-01-17 DIAGNOSIS — Z720 Tobacco use: Secondary | ICD-10-CM

## 2023-01-17 DIAGNOSIS — I739 Peripheral vascular disease, unspecified: Secondary | ICD-10-CM | POA: Diagnosis not present

## 2023-01-17 DIAGNOSIS — I2510 Atherosclerotic heart disease of native coronary artery without angina pectoris: Secondary | ICD-10-CM

## 2023-01-17 DIAGNOSIS — E785 Hyperlipidemia, unspecified: Secondary | ICD-10-CM | POA: Diagnosis not present

## 2023-01-17 DIAGNOSIS — I10 Essential (primary) hypertension: Secondary | ICD-10-CM

## 2023-01-17 DIAGNOSIS — R600 Localized edema: Secondary | ICD-10-CM

## 2023-01-17 DIAGNOSIS — R0609 Other forms of dyspnea: Secondary | ICD-10-CM

## 2023-01-17 MED ORDER — OLMESARTAN MEDOXOMIL 20 MG PO TABS: 20.0000 mg | ORAL_TABLET | Freq: Every day | ORAL | 11 refills | Status: DC

## 2023-01-17 MED ORDER — CARVEDILOL 6.25 MG PO TABS: 6.2500 mg | ORAL_TABLET | Freq: Two times a day (BID) | ORAL | 11 refills | Status: DC

## 2023-01-17 NOTE — Patient Instructions (Signed)
Medication Instructions:  Decrease Carvedilol 6.25 mg twice daily. Start Olemsartan 20 mg daily  *If you need a refill on your cardiac medications before your next appointment, please call your pharmacy*   Lab Work: BMET, BNT today BMET in 2 weeks   Testing/Procedures: NONE ordered at this time of appointment     Follow-Up: At [facility], you and your health needs are our priority.  As part of our continuing mission to provide you with exceptional heart care, we have created designated Provider Care Teams.  These Care Teams include your primary Cardiologist (physician) and Advanced Practice Providers (APPs -  Physician Assistants and Nurse Practitioners) who all work together to provide you with the care you need, when you need it.  We recommend signing up for the patient portal called "MyChart".  Sign up information is provided on this After Visit Summary.  MyChart is used to connect with patients for Virtual Visits (Telemedicine).  Patients are able to view lab/test results, encounter notes, upcoming appointments, etc.  Non-urgent messages can be sent to your provider as well.   To learn more about what you can do with MyChart, go to [email or web address].    Your next appointment:   1 month(s)  Provider:   Dr. Kirke Corin  or Azalee Course, PA-C

## 2023-01-17 NOTE — Progress Notes (Signed)
Office Visit    Patient Name: Joshua [name] Date of Encounter: 01/17/2023  Primary Care Provider:  Salli Real, MD Primary Cardiologist:  Donato Schultz, MD  Chief Complaint    67 year old male with a history of CAD s/p DES-mLAD, PAD s/p aortobifemoral bypass in 2010, R SFA stenting in 2019, right common femoral artery endarterectomy and profundoplasty in 2022, redo left femoral artery exposure, thrombectomy of left superficial femoral artery, left limb of aortobifemoral bypass graft, left common femoral and superficial femoral endarterectomy with Dacron patch angioplasty in 10/2022, hypertension, hyperlipidemia, and tobacco use who presents for follow-up related to CAD and hypertension.  Past Medical History    Past Medical History:  Diagnosis Date   AC (acromioclavicular) joint bone spurs, unspecified laterality 11/25/2016   Arthritis    "back" (05/16/2015)   CAD in native artery, occluded Lcx and rotational atherectomy to LAD with DES 05/17/15 05/18/2015   Cavitary lesion of lung 02/25/2018   Chronic back pain    "all my back"   Chronic bronchitis (HCC)    COPD (chronic obstructive pulmonary disease) (HCC)    Diabetes mellitus without complication (HCC)    pt states he is no longer diabetic, no medications   GERD (gastroesophageal reflux disease)    H/O blood clots    s/p IVC filter ~ 2010: per IR 12/17/16, would not recommended retrieval unless filterl causes new concerns before one of my neck ORs"   History of gout    Hyperlipidemia    Hypertension    Lung cancer (HCC) 02/25/2018   bronchoscopy, RUL + non-small cell carcinoma   Neuromuscular disorder (HCC)    PAD (peripheral artery disease) (HCC)    Phlebitis    Positive TB test    Seasonal allergies    Shortness of breath    Spinal disease    Past Surgical History:  Procedure Laterality Date   ABDOMINAL AORTOGRAM W/LOWER EXTREMITY  01/07/2018   ABDOMINAL AORTOGRAM W/LOWER EXTREMITY N/A 01/07/2018   Procedure:  ABDOMINAL AORTOGRAM W/LOWER EXTREMITY;  Surgeon: Iran Ouch, MD;  [facility]: [facility];  Service: Cardiovascular;  Laterality: N/A;   ABDOMINAL AORTOGRAM W/LOWER EXTREMITY N/A 12/27/2020   Procedure: ABDOMINAL AORTOGRAM W/LOWER EXTREMITY;  Surgeon: Iran Ouch, MD;  Location: [facility];  Service: Cardiovascular;  Laterality: N/A;   ABDOMINAL AORTOGRAM W/LOWER EXTREMITY N/A 04/03/2021   Procedure: ABDOMINAL AORTOGRAM W/LOWER EXTREMITY;  Surgeon: Nada Libman, MD;  Location: [facility];  Service: Cardiovascular;  Laterality: N/A;   ABDOMINAL AORTOGRAM W/LOWER EXTREMITY N/A 10/01/2022   Procedure: ABDOMINAL AORTOGRAM W/LOWER EXTREMITY;  Surgeon: Nada Libman, MD;  Location: [facility];  Service: Cardiovascular;  Laterality: N/A;   ANTERIOR CERVICAL DECOMP/DISCECTOMY FUSION  ~ 2001-2009 X 3   ANTERIOR CERVICAL DECOMP/DISCECTOMY FUSION N/A 10/06/2012   Procedure: ANTERIOR CERVICAL DECOMPRESSION/DISCECTOMY FUSION 1 LEVEL;  Surgeon: Reinaldo Meeker, MD;  Location: [facility];  Service: Neurosurgery;  Laterality: N/A;  C7T1 anterior cervical decompression with fusion plating and bonegraft    APPLICATION OF WOUND VAC Left 11/08/2022   Procedure: APPLICATION OF WOUND VAC;  Surgeon: Nada Libman, MD;  Location: [facility];  Service: Vascular;  Laterality: Left;   BACK SURGERY     BIOPSY  11/21/2020   Procedure: BIOPSY;  Surgeon: Napoleon Form, MD;  Location: [facility];  Service: Endoscopy;;   CARDIAC CATHETERIZATION  05/16/2015   CARDIAC CATHETERIZATION N/A 05/16/2015   Procedure: Left Heart Cath and Coronary Angiography;  Surgeon: Marykay Lex, MD;  Location: [facility] INVASIVE CV LAB;  Service: Cardiovascular;  Laterality: N/A;   CARDIAC CATHETERIZATION N/A 05/16/2015   Procedure: Coronary Balloon Angioplasty;  Surgeon: Marykay Lex, MD;  Location: [facility] INVASIVE CV LAB;  Service: Cardiovascular;  Laterality: N/A;   CARDIAC CATHETERIZATION N/A 05/17/2015    Procedure: Coronary Stent Intervention Rotoblater;  Surgeon: Peter M Swaziland, MD;  Location: [facility] INVASIVE CV LAB;  Service: Cardiovascular;  Laterality: N/A;   COLONOSCOPY WITH PROPOFOL N/A 11/21/2020   Procedure: COLONOSCOPY WITH PROPOFOL;  Surgeon: Napoleon Form, MD;  Location: [facility];  Service: Endoscopy;  Laterality: N/A;   ELBOW SURGERY Left 2016   "had bone pieces removed"   ENDARTERECTOMY Left 11/08/2022   Procedure: LEFT ILIOFEMORAL ENDARTERECTOMY;  Surgeon: Nada Libman, MD;  Location: [facility] OR;  Service: Vascular;  Laterality: Left;   ENDARTERECTOMY FEMORAL Right 12/28/2020   Procedure: RIGHT FEMORAL ARTERY ENDARTERECTOMY;  Surgeon: Nada Libman, MD;  Location: [facility] OR;  Service: Vascular;  Laterality: Right;   ESOPHAGEAL MANOMETRY N/A 12/30/2016   Procedure: ESOPHAGEAL MANOMETRY (EM);  Surgeon: Hilarie Fredrickson, MD;  Location: [facility];  Service: Endoscopy;  Laterality: N/A;   ESOPHAGOGASTRODUODENOSCOPY (EGD) WITH PROPOFOL N/A 11/21/2020   Procedure: ESOPHAGOGASTRODUODENOSCOPY (EGD) WITH PROPOFOL;  Surgeon: Napoleon Form, MD;  Location: [facility];  Service: Endoscopy;  Laterality: N/A;   EXCISIONAL HEMORRHOIDECTOMY     FEMORAL BYPASS  ~ 2010   FEMORAL-POPLITEAL BYPASS GRAFT Right 04/04/2021   Procedure: RIGHT FEMORAL-POPLITEAL ARTERY BYPASS GRAFT;  Surgeon: Nada Libman, MD;  Location: [facility];  Service: Vascular;  Laterality: Right;   IR RADIOLOGIST EVAL & MGMT  12/17/2016   IVC FILTER PLACEMENT ([facility] HX)  2010   "had blood clots in my back"   NECK SURGERY  2014   2008-2014   [facility] ANGIOPLASTY Right 12/28/2020   Procedure: PATCH ANGIOPLASTY USING;  Surgeon: Nada Libman, MD;  Location: [facility] OR;  Service: Vascular;  Laterality: Right;   PATCH ANGIOPLASTY Left 11/08/2022   Procedure: LEFT FEMORAL ARTERY PATCH ANGIOPLASTY USING HEMASHEILD PLATINUM DACRON PATCH 0.8CM X 15.2CM;  Surgeon: Nada Libman, MD;  [facility];  Service: Vascular;  Laterality:  Left;   PERIPHERAL VASCULAR ATHERECTOMY  01/07/2018   Procedure: PERIPHERAL VASCULAR ATHERECTOMY;  Surgeon: Iran Ouch, MD;  [facility]: [facility];  Service: Cardiovascular;;   PERIPHERAL VASCULAR BALLOON ANGIOPLASTY  01/07/2018   Procedure: PERIPHERAL VASCULAR BALLOON ANGIOPLASTY;  Surgeon: Iran Ouch, MD;  [facility];  Service: Cardiovascular;;   PERIPHERAL VASCULAR CATHETERIZATION N/A 02/14/2016   Procedure: Abdominal Aortogram w/Lower Extremity;  Surgeon: Iran Ouch, MD;  [facility];  Service: Cardiovascular;  Laterality: N/A;   PERIPHERAL VASCULAR INTERVENTION Right 04/03/2021   Procedure: PERIPHERAL VASCULAR INTERVENTION;  Surgeon: Nada Libman, MD;  [facility];  Service: Cardiovascular;  Laterality: Right;   POLYPECTOMY  11/21/2020   Procedure: POLYPECTOMY;  Surgeon: Napoleon Form, MD;  Location: [facility];  Service: Endoscopy;;   POSTERIOR LAMINECTOMY / DECOMPRESSION LUMBAR SPINE  2006   "bone spurs"   THROMBECTOMY FEMORAL ARTERY Left 11/08/2022   Procedure: THROMBECTOMY SUPERFICIAL FEMORAL ARTERY;  Surgeon: Nada Libman, MD;  [facility]: [facility];  Service: Vascular;  Laterality: Left;   THROMBECTOMY ILIAC ARTERY N/A 11/08/2022   Procedure: THROMBECTOMY OF AORTA BIFEMORAL GRAFT;  Surgeon: Nada Libman, MD;  Location: [facility];  Service: Vascular;  Laterality: N/A;  VIDEO BRONCHOSCOPY WITH ENDOBRONCHIAL NAVIGATION Right 02/25/2018   Procedure: VIDEO BRONCHOSCOPY WITH ENDOBRONCHIAL NAVIGATION;  Surgeon: Josephine Igo, DO;  Location: [facility];  Service: Thoracic;  Laterality: Right;   VIDEO BRONCHOSCOPY WITH ENDOBRONCHIAL ULTRASOUND Right 02/25/2018   Procedure: VIDEO BRONCHOSCOPY WITH ENDOBRONCHIAL ULTRASOUND;  Surgeon: Josephine Igo, DO;  Location: [facility] OR;  Service: Thoracic;  Laterality: Right;    Allergies  Allergies  Allergen Reactions   Zestril [Lisinopril] Hives and Itching   Contrast  Media [Iodinated Contrast Media] Hives and Itching    OK if pt has Benadryl pre-med.   Elavil [Amitriptyline] Nausea Only and Other (See Comments)    Makes pt "feel weird"   Gadolinium Derivatives Hives and Itching   Motrin [Ibuprofen] Other (See Comments)    Internal bleeding     Labs/Other Studies Reviewed    The following studies were reviewed today:  Cardiac Studies & Procedures   CARDIAC CATHETERIZATION  CARDIAC CATHETERIZATION 05/17/2015  Narrative  Mid LAD lesion, 80% stenosed. Post intervention, there is a 0% residual stenosis.  Prox LAD to Mid LAD lesion, 80% stenosed. Post intervention, there is a 0% residual stenosis. The lesion was previously treated with angioplasty.  Successful rotational atherectomy and stenting of tandem lesions in the mid LAD with DES.  Plan: DAPT for one year. Anticipate DC in am.  Findings Coronary Findings Diagnostic  Dominance: Right  Left Main . Vessel is large.  Left Anterior Descending Severely Calcified. The lesion was previously treated with angioplasty. Discrete. Discrete.  First Diagonal Branch The vessel is moderate in size.  First Septal Branch The vessel is small in size.  Second Diagonal Branch The vessel is small in size.  Second Septal Branch The vessel is small in size.  Third Septal Branch The vessel is small in size.  Left Circumflex . Vessel is angiographically normal. Diffuse chronic total occlusion. Diffuse.  First Obtuse Marginal Branch The vessel is small in size.  Second Obtuse Marginal Branch The vessel is moderate in size. Diffuse.  Lateral Second Obtuse Marginal Branch Collaterals Lat 2nd Mrg filled by collaterals from 2nd Diag.  Right Coronary Artery . Vessel is large. Diffuse.  Right Posterior Descending Artery The vessel is moderate in size.  Right Posterior Atrioventricular Artery The vessel is moderate in size.  First Right Posterolateral Branch The vessel is small in  size.  Second Right Posterolateral Branch The vessel is small in size.  Third Right Posterolateral Branch The vessel is small in size.  Intervention  Prox LAD to Mid LAD lesion Atherectomy Rotational atherectomy was performed. The intervention was successful. PCI The pre-interventional distal flow is normal (TIMI 3). Pre-stent angioplasty was performed. 2.5 mm [place] balloon and 2.5 mm [place] balloon A drug-eluting stent was placed. Post-stent angioplasty was performed. 3.25 mm [place] balloon Maximum pressure: 16 atm. The post-interventional distal flow is normal (TIMI 3). The intervention was successful. No complications occurred at this lesion. Supplies used: CUTTING BAL FLEXTOME RX2.25X10; STENT SYNERGY DES 3X16; CATH ROTALINK PLUS 1.50MM There is a 0% residual stenosis post intervention.  Mid LAD lesion Atherectomy Rotational atherectomy was performed. The intervention was successful. PCI (Also treats lesions: Dist LAD) The pre-interventional distal flow is normal (TIMI 3). Pre-stent angioplasty was performed. 2.5 mm [place] balloon A drug-eluting stent was placed. Post-stent angioplasty was performed. 3.25 mm [place] balloon Maximum pressure: 16 atm. The post-interventional distal flow is normal (TIMI 3). The intervention was successful. No complications occurred at this lesion. Supplies used: CUTTING BAL FLEXTOME [id number];  STENT SYNERGY DES 3X16 There is a 0% residual stenosis post intervention.  Dist LAD lesion PCI (Also treats lesions: Mid LAD) See details in Mid LAD lesion. Supplies used: CUTTING BAL FLEXTOME RX2.25X10; STENT SYNERGY DES 3X16 There is a 0% residual stenosis post intervention.   CARDIAC CATHETERIZATION 05/16/2015  Narrative  Prox LAD to Mid LAD lesion, 90% stenosed. Post intervention, there remains 80% residual calcific stenosis.  Mid LAD lesion, 80% stenosed. Dist LAD lesion, 55% stenosed.  Prox Cx lesion, 100% stenosed. Prox Cx to Mid Cx lesion, 99% stenosed. Ost  2nd Mrg to 2nd Mrg lesion, 99% stenosed. -- Seen via retrograde filling from LAD/diagonal-OM collaterals  There is hyperdynamic left ventricular systolic function.  Elevated LVEDP   Severe 2 site lesion in the LAD. Very rigid lesions that would not yield to [place] Balloon or noncompliant balloon angioplasty. These lesions are likely best treated with rotational atherectomy after review with Dr. Peter Swazil[place].   Plan:  Monitor overnight following PTCA. We'll plan for return to the Cath Lab tomorrow for rotational atherectomy of the LAD with stenting of the 2 lesions. Advil for Dr. Peter [place]azil[place].  Continue Brilinta and aspirin. Will restart IV heparin 8 hours after TR band removal  Add bisoprolol for beta blocker.  He will also need statin added   Expect they'll be able to be discharged following day after rotational atherectomy/PCI.  Findings Coronary Findings Diagnostic  Dominance: Right  Left Main . Vessel is large.  Left Anterior Descending Severely Calcified. Discrete. Discrete.  First Diagonal Branch The vessel is moderate in size.  First Septal Branch The vessel is small in size.  Second Diagonal Branch The vessel is small in size.  Second Septal Branch The vessel is small in size.  Third Septal Branch The vessel is small in size.  Left Circumflex . Vessel is angiographically normal. Diffuse chronic total occlusion. Diffuse.  First Obtuse Marginal Branch The vessel is small in size.  Second Obtuse Marginal Branch The vessel is moderate in size. Diffuse.  Lateral Second Obtuse Marginal Branch Collaterals Lat 2nd Mrg filled by collaterals from 2nd Diag.  Right Coronary Artery . Vessel is large. Diffuse.  Right Posterior Descending Artery The vessel is moderate in size.  Right Posterior Atrioventricular Artery The vessel is moderate in size.  First Right Posterolateral Branch The vessel is small in size.  Second Right Posterolateral  Branch The vessel is small in size.  Third Right Posterolateral Branch The vessel is small in size.  Intervention  Prox LAD to Mid LAD lesion Angioplasty Lesion length: 14 mm.   Angioplasty alone was performed.   Maximum pressure: 14 atm.    The pre-interventional distal flow is normal (TIMI 3).  The post-interventional distal flow is normal (TIMI 3).   the intervention was unsuccessful due to an inability to expand the balloon and deliver the stent   Unable to fully expand a 2.5 mm noncompliant balloon; unable to advance the catheter beyond this lesion to the more distal lesion      No complications occured at this lesion.   IC nitroglycerin was given.  Final angiography does clearly show calcification and haziness in this lesion. The patient was not having any active symptoms.     CATH VISTA GUIDE 6FR XBLAD3.5 ([id number]); WIRE HI TORQ [place] 190CM ([id number] HC) - initial workhorse wire, then used a buddy wire after WIRE LUGE 182CM ([id number]) was advanced. Multiple balloon inflations were made with minimal effect Supplies used: BALLN EU[place]ORA RX 2.0X12; BALLN  ANGIOSCULPT RX 2.0X10; BALLN [place] RX 2.5X12; BALLN [place] TREK RX 2.5X6 PCI A stent was not placed. There is a 80% residual stenosis post intervention.   STRESS TESTS  MYOCARDIAL PERFUSION IMAGING 05/10/2015  Interpretation Summary  The left ventricular ejection fraction is normal (55-65%).  Nuclear stress EF: 63%.  There is a medium defect of moderate severity present in the basal inferior, mid inferior and apical inferior location. The defect is non-reversible. In the setting of normal LVF this most likely represents diaphragmatic attenuation. There is no ischemia.  There is a medium defect of moderate severity present in the basal inferolateral, mid inferolateral and apical lateral location. The defect is partially reversible. In the setting of normal LVF this may likely be due to variations in diaphragmatic attenuation but cannot rule  out ischemia.In the setting of normal LVF this may likely be due to variations in diaphragmatic attenuation but cannot rule out ischemia.  This is an intermediate risk study.   ECHOCARDIOGRAM  ECHOCARDIOGRAM COMPLETE 09/17/2022  Narrative ECHOCARDIOGRAM REPORT    Patient Name:   Joshua [name] Aron Date of Exam: 09/17/2022 Medical Rec #:  [id number]          Height:       69.0 in Accession #:    [id number]         Weight:       234.6 lb Date of Birth:  10-04-1955          BSA:          2.211 m Patient Age:    66 years           BP:           138/72 mmHg Patient Gender: M                  HR:           50 bpm. Exam Location:  [address]  Procedure: 2D Echo, 3D Echo, Cardiac Doppler, Strain Analysis and Color Doppler  Indications:    R06.02 Shortness of breath  History:        Patient has prior history of Echocardiogram examinations. CAD and occluded Lcx and rotation atherectomy to LAD with DES, History of lung cancer, COPD and PAD; Risk Factors:Diabetes, Hypertension, Dyslipidemia and Current Smoker.  Sonographer:    Chanetta Marshall BA, RDCS Referring Phys: 66 MUHAMMAD A ARIDA  IMPRESSIONS   1. Left ventricular ejection fraction, by estimation, is 60 to 65%. Left ventricular ejection fraction by 3D volume is 61 %. The left ventricle has normal function. The left ventricle has no regional wall motion abnormalities. There is severe concentric left ventricular hypertrophy. Left ventricular diastolic parameters are consistent with Grade I diastolic dysfunction (impaired relaxation). The average left ventricular global longitudinal strain is -21.2 %. The global longitudinal strain is normal. 2. Right ventricular systolic function is normal. The right ventricular size is normal. Tricuspid regurgitation signal is inadequate for assessing PA pressure. 3. The mitral valve is normal in structure. No evidence of mitral valve regurgitation. No evidence of mitral stenosis. 4. The aortic valve  is tricuspid. Aortic valve regurgitation is not visualized. 5. The inferior vena cava is normal in size with greater than 50% respiratory variability, suggesting right atrial pressure of 3 mmHg.  Comparison(s): No significant change from prior study.  FINDINGS Left Ventricle: Left ventricular ejection fraction, by estimation, is 60 to 65%. Left ventricular ejection fraction by 3D volume is 61 %. The left ventricle has normal function. The  left ventricle has no regional wall motion abnormalities. The average left ventricular global longitudinal strain is -21.2 %. The global longitudinal strain is normal. The left ventricular internal cavity size was normal in size. There is severe concentric left ventricular hypertrophy. Left ventricular diastolic parameters are consistent with Grade I diastolic dysfunction (impaired relaxation).  Right Ventricle: The right ventricular size is normal. No increase in right ventricular wall thickness. Right ventricular systolic function is normal. Tricuspid regurgitation signal is inadequate for assessing PA pressure.  Left Atrium: Left atrial size was normal in size.  Right Atrium: Right atrial size was normal in size.  Pericardium: There is no evidence of pericardial effusion.  Mitral Valve: The mitral valve is normal in structure. No evidence of mitral valve regurgitation. No evidence of mitral valve stenosis.  Tricuspid Valve: The tricuspid valve is normal in structure. Tricuspid valve regurgitation is not demonstrated. No evidence of tricuspid stenosis.  Aortic Valve: The aortic valve is tricuspid. Aortic valve regurgitation is not visualized.  Pulmonic Valve: The pulmonic valve was not well visualized. Pulmonic valve regurgitation is trivial. No evidence of pulmonic stenosis.  Aorta: The aortic root and ascending aorta are structurally normal, with no evidence of dilitation.  Venous: The inferior vena cava is normal in size with greater than 50%  respiratory variability, suggesting right atrial pressure of 3 mmHg.  IAS/Shunts: No atrial level shunt detected by color flow Doppler.   LEFT VENTRICLE PLAX 2D LVIDd:         5.00 cm         Diastology LVIDs:         2.60 cm         LV e' medial:    6.85 cm/s LV PW:         1.40 cm         LV E/e' medial:  12.5 LV IVS:        1.60 cm         LV e' lateral:   8.16 cm/s LVOT diam:     2.20 cm         LV E/e' lateral: 10.5 LV SV:         81 LV SV Index:   37              2D LVOT Area:     3.80 cm        Longitudinal Strain 2D Strain GLS  -20.8 % (A2C): 2D Strain GLS  -21.8 % (A3C): 2D Strain GLS  -21.0 % (A4C): 2D Strain GLS  -21.2 % Avg:  3D Volume EF LV 3D EF:    Left ventricul ar ejection fraction by 3D volume is 61 %.  3D Volume EF: 3D EF:        61 % LV EDV:       132 ml LV ESV:       51 ml LV SV:        81 ml  RIGHT VENTRICLE             IVC RV Basal diam:  3.40 cm     IVC diam: 2.00 cm RV Mid diam:    2.50 cm RV S prime:     10.00 cm/s TAPSE (M-mode): 2.6 cm RVSP:           18.7 mmHg  LEFT ATRIUM             Index        RIGHT ATRIUM  Index LA diam:        3.90 cm 1.76 cm/m   RA Pressure: 3.00 mmHg LA Vol (A2C):   51.1 ml 23.11 ml/m  RA Area:     18.30 cm LA Vol (A4C):   42.5 ml 19.22 ml/m  RA Volume:   49.70 ml  22.48 ml/m LA Biplane Vol: 47.0 ml 21.26 ml/m AORTIC VALVE LVOT Vmax:   95.90 cm/s LVOT Vmean:  57.600 cm/s LVOT VTI:    0.214 m  AORTA Ao Root diam: 2.90 cm Ao Asc diam:  3.10 cm  MITRAL VALVE               TRICUSPID VALVE MV Area (PHT): 3.48 cm    TR Peak grad:   15.7 mmHg MV Decel Time: 218 msec    TR Vmax:        198.00 cm/s MV E velocity: 85.90 cm/s  Estimated RAP:  3.00 mmHg MV A velocity: 70.20 cm/s  RVSP:           18.7 mmHg MV E/A ratio:  1.22 SHUNTS Systemic VTI:  0.21 m Systemic Diam: 2.20 cm  Riley Lam MD Electronically signed by Riley Lam MD Signature Date/Time:  09/17/2022/12:47:53 PM    Final            Recent Labs: 06/12/2022: TSH 1.901 06/17/2022: B Natriuretic Peptide 238.7 06/19/2022: Magnesium 1.8 10/31/2022: ALT 11 11/09/2022: BUN 17; Creatinine, Ser 1.39; Hemoglobin 13.7; Platelets 178; Potassium 4.9; Sodium 135  Recent Lipid Panel    Component Value Date/Time   CHOL 100 11/09/2022 0247   CHOL 215 (H) 11/21/2017 0810   TRIG 87 11/09/2022 0247   HDL 35 (L) 11/09/2022 0247   HDL 126 11/21/2017 0810   CHOLHDL 2.9 11/09/2022 0247   VLDL 17 11/09/2022 0247   LDLCALC 48 11/09/2022 0247   LDLCALC 75 07/09/2019 1435   LDLDIRECT 107.0 04/21/2015 1352    History of Present Illness    67 year old male with the above past medical history including CAD s/p DES-mLAD, PAD s/p aortobifemoral bypass in 2010, right SFA stenting in 2019, right common femoral artery endarterectomy and profundoplasty in 2022, redo left femoral artery exposure, thrombectomy of left superficial femoral artery, left limb of aortobifemoral bypass graft, left common femoral and superficial femoral endarterectomy with Dacron patch angioplasty in 10/2022, hypertension, hyperlipidemia, and tobacco use.  He has a history of CAD s/p stenting to his mid LAD in 2017.  He has a history of extensive PAD with multiple interventions as above. He was last seen in office on 08/13/2022 and noted worsening claudication, mild dyspnea on exertion. Echocardiogram in 09/2022 showed EF 60 to 65%, normal LV function, no RWMA, severe concentric LVH, G1 DD, normal RV, no significant valvular abnormalities. He underwent redo left femoral artery exposure, thrombectomy of left superficial femoral artery, left limb of aortobifemoral bypass graft, left common femoral and superficial femoral endarterectomy with dacyron patch angioplasty in 10/2022 per [facility]. He contacted our office on 01/16/2023 with concern for elevated BP readings.    He presents today for follow-up accompanied by his wife.  Since his last visit he  has been stable from a cardiac standpoint. He notes bilateral lower extremity edema, right greater than left, dyspnea on exertion, and mild orthopnea.  His symptoms have worsened slightly over the past month. Additionally, his BP has been elevated, with associated headaches. He denies chest pain, palpitations. He notes occasional lightheadedness, denies presyncope or syncope.   Home Medications    Current  Outpatient Medications  Medication Sig Dispense Refill   albuterol (PROAIR HFA) 108 (90 Base) MCG/ACT inhaler USE 2 PUFFS EVERY 6 HOURS  AS NEEDED FOR WHEEZING OR  SHORTNESS OF BREATH (Patient taking differently: Inhale 2 puffs into the lungs every 6 (six) hours as needed for wheezing or shortness of breath.) 34 g 3   albuterol (PROVENTIL) (2.5 MG/3ML) 0.083% nebulizer solution Take 3 mLs (2.5 mg total) by nebulization every 6 (six) hours as needed for wheezing or shortness of breath. 75 mL 5   aspirin EC 81 MG tablet Take 81 mg by mouth daily.     carvedilol (COREG) 6.25 MG tablet Take 1 tablet (6.25 mg total) by mouth 2 (two) times daily. 60 tablet 11   Cholecalciferol (VITAMIN D) 50 MCG (2000 UT) tablet Take 2,000 Units by mouth daily.     colchicine 0.6 MG tablet TAKE 2 TABLETS BY MOUTH AS  DIRECTED THEN 1 TABLET  AFTER 1 HOUR. CAN TAKE 1  TABLET TWICE DAILY  THEREAFTER. 60 tablet 11   EPINEPHrine (PRIMATENE MIST) 0.125 MG/ACT AERO Inhale 1 puff into the lungs daily as needed (COPD flare).     ezetimibe (ZETIA) 10 MG tablet TAKE 1 TABLET BY MOUTH DAILY 100 tablet 0   Fluticasone-Umeclidin-Vilant (TRELEGY ELLIPTA) 200-62.5-25 MCG/ACT AEPB Inhale 1 puff into the lungs every evening.     olmesartan (BENICAR) 20 MG tablet Take 1 tablet (20 mg total) by mouth daily. 30 tablet 11   omeprazole (PRILOSEC) 40 MG capsule TAKE 1 CAPSULE BY MOUTH DAILY 90 capsule 0   pregabalin (LYRICA) 100 MG capsule Take 100 mg by mouth 3 (three) times daily.     rosuvastatin (CRESTOR) 40 MG tablet TAKE 1 TABLET BY  MOUTH DAILY 30 tablet 11   traZODone (DESYREL) 100 MG tablet TAKE 1 TABLET BY MOUTH AT  BEDTIME (Patient taking differently: Take 100 mg by mouth at bedtime as needed for sleep.) 90 tablet 2   HYDROcodone-acetaminophen (NORCO) 5-325 MG tablet Take 1 tablet by mouth every 6 (six) hours as needed for moderate pain. (Patient not taking: Reported on 01/17/2023) 15 tablet 0   No current facility-administered medications for this visit.     Review of Systems    He denies chest pain, palpitations, pnd, orthopnea, n, v, dizziness, syncope, nonpitting bilateral lower extremity edema, weight gain, or early satiety. All other systems reviewed and are otherwise negative except as noted above.   Physical Exam    VS:  BP 120/80 (BP Location: Left Arm, Patient Position: Sitting, Cuff Size: Normal)   Pulse (!) 48   Ht 5\' 9"  (1.753 m)   Wt 235 lb (106.6 kg)   SpO2 98%   BMI 34.70 kg/m   GEN: Well nourished, well developed, in no acute distress. HEENT: normal. Neck: Supple, no JVD, carotid bruits, or masses. Cardiac: RRR, no murmurs, rubs, or gallops. No clubbing, cyanosis, edema.  Radials/DP/PT 2+ and equal bilaterally.  Respiratory:  Respirations regular and unlabored, clear to auscultation bilaterally. GI: Soft, nontender, nondistended, BS + x 4. MS: no deformity or atrophy. Skin: warm and dry, no rash. Neuro:  Strength and sensation are intact. Psych: Normal affect.  Accessory Clinical Findings    ECG personally reviewed by me today - EKG Interpretation Date/Time:  Friday January 17 2023 14:56:50 EDT Ventricular Rate:  48 PR Interval:  146 QRS Duration:  78 QT Interval:  554 QTC Calculation: 494 R Axis:   52  Text Interpretation: Sinus bradycardia Possible Left atrial enlargement  Artifact Prolonged QT Confirmed by Bernadene Person ([id number]) on 01/17/2023 3:03:23 PM  - no acute changes.   Lab Results  Component Value Date   WBC 15.0 (H) 11/09/2022   HGB 13.7 11/09/2022   HCT 41.1 11/09/2022    MCV 88.4 11/09/2022   PLT 178 11/09/2022   Lab Results  Component Value Date   CREATININE 1.39 (H) 11/09/2022   BUN 17 11/09/2022   NA 135 11/09/2022   K 4.9 11/09/2022   CL 105 11/09/2022   CO2 22 11/09/2022   Lab Results  Component Value Date   ALT 11 10/31/2022   AST 15 10/31/2022   ALKPHOS 75 10/31/2022   BILITOT 0.4 10/31/2022   Lab Results  Component Value Date   CHOL 100 11/09/2022   HDL 35 (L) 11/09/2022   LDLCALC 48 11/09/2022   LDLDIRECT 107.0 04/21/2015   TRIG 87 11/09/2022   CHOLHDL 2.9 11/09/2022    Lab Results  Component Value Date   HGBA1C 6.2 (H) 10/31/2022    Assessment & Plan   1. Bilateral lower extremity edema/dyspnea on exertion: He does have some baseline dyspnea exertion in the setting of COPD. He notes a 1 month history of worsening dyspnea on exertion, mild orthopnea, bilateral lower extremity edema, right greater than left. He denies chest pain. Echocardiogram in 09/2022 showed EF 60 to 65%, normal LV function, no RWMA, severe concentric LVH, G1 DD, normal RV, no significant valvular abnormalities. Will check BNP, BMET today. If BNP elevated, consider addition of Lasix.   2. Hypertension: Recently elevated BP. It appears he has been on several blood pressure medications, these were all discontinued at some point, though specific reason for discontinuation is unclear. Will start olmesartan 20 mg daily, will decrease carvedilol as below. Will check BMET in 2 weeks.   2. Bradycardia: Heart rate is 48 bpm, HR has been in the 50s bpm at home.  He notes occasional lightheadedness, denies palpitations, presyncope or syncope.  Will decrease carvedilol to 6.25 mg twice daily. Continue to monitor.   3. CAD: S/p stenting to his mid LAD in 2017. Echocardiogram in 09/2022 showed EF 60 to 65%, normal LV function, no RWMA, severe concentric LVH, G1 DD, normal RV, no significant valvular abnormalities. Recent increased dyspnea on exertion as above.  Recent  echocardiogram overall reassuring.  If symptoms progress, consider need for ischemic evaluation.  Continue aspirin, carvedilol, Crestor, Zetia.  4. PAD: PAD s/p aortobifemoral bypass in 2010, right SFA stenting in 2019, right common femoral artery endarterectomy and profundoplasty in 2022, redo left femoral artery exposure, thrombectomy of left superficial femoral artery, left limb of aortobifemoral bypass graft, left common femoral and superficial femoral endarterectomy with Dacron patch angioplasty in 10/2022. Denies worsening claudication. Following with [facility].  Continue aspirin, Crestor, Zetia.  5. Hyperlipidemia: LDL was 48 in 10/2022. Continue Crestor.   6. Tobacco use: He continues to smoke. Full cessation advised.   7. Disposition: Follow-up in 1 month with Azalee Course, PA or Dr. Kirke Corin.       Joylene Grapes, NP 01/17/2023, 5:25 PM

## 2023-01-20 LAB — BASIC METABOLIC PANEL
BUN/Creatinine Ratio: 9 — ABNORMAL LOW (ref 10–24)
BUN: 10 mg/dL (ref 8–27)
CO2: 30 mmol/L — ABNORMAL HIGH (ref 20–29)
Calcium: 8.6 mg/dL (ref 8.6–10.2)
Chloride: 99 mmol/L (ref 96–106)
Creatinine, Ser: 1.1 mg/dL (ref 0.76–1.27)
Glucose: 64 mg/dL — ABNORMAL LOW (ref 70–99)
Potassium: 3.8 mmol/L (ref 3.5–5.2)
Sodium: 143 mmol/L (ref 134–144)
eGFR: 74 mL/min/{1.73_m2} (ref >59)

## 2023-01-20 LAB — BRAIN NATRIURETIC PEPTIDE: BNP: 294.2 pg/mL — ABNORMAL HIGH (ref 0.0–100.0)

## 2023-01-21 ENCOUNTER — Ambulatory Visit: Payer: Medicare Other | Admitting: Physician Assistant

## 2023-01-24 ENCOUNTER — Telehealth: Payer: Self-pay

## 2023-01-24 NOTE — Telephone Encounter (Signed)
Lmom to discuss lab results. Waiting on a return call.  

## 2023-01-28 ENCOUNTER — Telehealth: Payer: Self-pay | Admitting: Cardiology

## 2023-01-28 DIAGNOSIS — Z79899 Other long term (current) drug therapy: Secondary | ICD-10-CM

## 2023-01-28 DIAGNOSIS — I10 Essential (primary) hypertension: Secondary | ICD-10-CM

## 2023-01-28 MED ORDER — FUROSEMIDE 20 MG PO TABS: 20.0000 mg | ORAL_TABLET | Freq: Every day | ORAL | 3 refills | Status: DC

## 2023-01-28 MED ORDER — POTASSIUM CHLORIDE CRYS ER 10 MEQ PO TBCR: 10.0000 meq | EXTENDED_RELEASE_TABLET | Freq: Every day | ORAL | 3 refills | Status: DC

## 2023-01-28 MED ORDER — OLMESARTAN MEDOXOMIL 40 MG PO TABS: 40.0000 mg | ORAL_TABLET | Freq: Every day | ORAL | 3 refills | Status: DC

## 2023-01-28 NOTE — Telephone Encounter (Signed)
Pt c/o BP issue: STAT if pt c/o blurred vision, one-sided weakness or slurred speech  1. What are your last 5 BP readings? 196/119; 172/93 HR  2. Are you having any other symptoms (ex. Dizziness, headache, blurred vision, passed out)?  Headaches  3. What is your BP issue?

## 2023-01-28 NOTE — Telephone Encounter (Signed)
Spoke with pt. Pt was notified of Joshua Person NP recommendations of increasing Olmesartan 40 mg daily. Pt will monitor BP and report BP consistently greater than 130/80. Medication sent to pts pharmacy.   Also discussed lab results with pt. Pt will repeat labs BMET in 1-2 weeks, start Lasix 20 mg daily and Potassium 10 mEq daily as directed. Medications sent to pts pharmacy. Pt and pts spouse voiced understanding.

## 2023-01-28 NOTE — Telephone Encounter (Signed)
Patient states he is returning a call regarding this encounter.

## 2023-01-28 NOTE — Addendum Note (Signed)
Addended by: Lamar Benes on: 01/28/2023 01:40 PM   Modules accepted: Orders

## 2023-01-28 NOTE — Telephone Encounter (Signed)
Attempted PC and left voicemail to contact triage at [phone].

## 2023-01-29 ENCOUNTER — Other Ambulatory Visit: Payer: Self-pay

## 2023-01-29 DIAGNOSIS — I739 Peripheral vascular disease, unspecified: Secondary | ICD-10-CM

## 2023-02-07 LAB — BASIC METABOLIC PANEL
BUN/Creatinine Ratio: 9 — ABNORMAL LOW (ref 10–24)
BUN: 11 mg/dL (ref 8–27)
CO2: 23 mmol/L (ref 20–29)
Calcium: 8.8 mg/dL (ref 8.6–10.2)
Chloride: 100 mmol/L (ref 96–106)
Creatinine, Ser: 1.17 mg/dL (ref 0.76–1.27)
Glucose: 45 mg/dL — ABNORMAL LOW (ref 70–99)
Potassium: 4 mmol/L (ref 3.5–5.2)
Sodium: 145 mmol/L — ABNORMAL HIGH (ref 134–144)
eGFR: 69 mL/min/{1.73_m2} (ref >59)

## 2023-02-10 ENCOUNTER — Telehealth: Payer: Self-pay

## 2023-02-10 NOTE — Telephone Encounter (Signed)
Spoke with pts spouse. She was notified of pts lab results. Pt will notify his PCP of the low glucose level, continue his current medication and f/u as planned.

## 2023-02-11 ENCOUNTER — Ambulatory Visit: Payer: Medicare Other | Admitting: Cardiovascular Disease

## 2023-02-17 NOTE — Progress Notes (Unsigned)
Cardiology Office Note   Date:  02/18/2023   ID:  Joshua [name], DOB January 14, 1956, MRN [id number]  PCP:  Salli Real, MD  Cardiologist:  Dr. Anne Fu  No chief complaint on file.      History of Present Illness: Joshua [name] is a 67 y.o. male who is here today for a follow up visit regarding peripheral arterial disease.   He has known history of coronary artery disease status post rotational atherectomy and drug-eluting stent placement to the mid LAD. He has known history of peripheral arterial disease status post aortobifemoral bypass in 2010 at [facility], diabetes mellitus with diabetic neuropathy, hyperlipidemia and tobacco use. The patient does not have left carotid bruit but carotid Doppler showed less than 40% stenosis. He had worsening right leg claudication in 2019 and thus angiography was performed which showed widely patent aortobifemoral bypass.  There was severe calcified stenosis affecting the ostial SFA with moderate calcified disease in the mid and distal segment and three-vessel runoff below the knee.  On the left side, there was 60% ostial stenosis of the left SFA which was stable from before.  I performed orbital atherectomy and drug-coated balloon angioplasty to the ostial and proximal right SFA via the left brachial artery. He was hospitalized in August of 2022 with suspected GI bleed and was found to be in atrial fibrillation but converted to sinus rhythm.  He was felt to be not a good anticoagulation candidate.  Echocardiogram showed normal LV systolic function.  He had EGD and colonoscopy and was told about stomach ulcers and colon polyps which were removed.  He was seen in 2022 due to rest pain affecting the right lower extremity.  Angiography was performed in October 2022 which showed patent aorto bifemoral bypass.  On the right side, there was significant stenosis in the common femoral artery at the outflow of the bypass with severe calcified ostial profunda stenosis  and flush occlusion of the SFA with reconstitution distally with moderate calcified stenosis in the popliteal artery and two-vessel runoff below the knee.  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  The patient underwent right common femoral artery endarterectomy and profundoplasty with Dr. Myra Gianotti.  He continued to have severe right lower extremity claudication and ultimately underwent right femoral-popliteal bypass.  He had left common femoral artery endarterectomy in August of this year.  Most recent Doppler studies in October showed normal ABI bilaterally.  He had an echocardiogram done in July of this year which showed normal LV systolic function with no significant valvular abnormalities.  He was seen in our office last month due to increased lower extremity edema.  He was noted to be bradycardic.  Carvedilol was decreased and olmesartan was added.  In addition, he was started on small dose furosemide.  His BNP was mildly elevated.  He reports significant improvement in lower extremity edema.  No lower extremity claudication.  No chest pain. He cut down on tobacco use to half a pack per day.   Past Medical History:  Diagnosis Date   AC (acromioclavicular) joint bone spurs, unspecified laterality 11/25/2016   Arthritis    "back" (05/16/2015)   CAD in native artery, occluded Lcx and rotational atherectomy to LAD  with DES 05/17/15 05/18/2015   Cavitary lesion of lung 02/25/2018   Chronic back pain    "all my back"   Chronic bronchitis (HCC)    COPD (chronic obstructive pulmonary disease) (HCC)    Diabetes mellitus without complication (HCC)    pt states he is no longer diabetic, no medications   GERD  (gastroesophageal reflux disease)    H/O blood clots    s/p IVC filter ~ 2010: per IR 12/17/16, would not recommended retrieval unless filterl causes new concerns before one of my neck ORs"   History of gout    Hyperlipidemia    Hypertension    Lung cancer (HCC) 02/25/2018   bronchoscopy, RUL + non-small cell carcinoma   Neuromuscular disorder (HCC)    PAD (peripheral artery disease) (HCC)    Phlebitis    Positive TB test    Seasonal allergies    Shortness of breath    Spinal disease     Past Surgical History:  Procedure Laterality Date   ABDOMINAL AORTOGRAM W/LOWER EXTREMITY  01/07/2018   ABDOMINAL AORTOGRAM W/LOWER EXTREMITY N/A 01/07/2018   Procedure: ABDOMINAL AORTOGRAM W/LOWER EXTREMITY;  Surgeon: Iran Ouch, MD;  Location: [facility];  Service: Cardiovascular;  Laterality: N/A;   ABDOMINAL AORTOGRAM W/LOWER EXTREMITY N/A 12/27/2020   Procedure: ABDOMINAL AORTOGRAM W/LOWER EXTREMITY;  Surgeon: Iran Ouch, MD;  Location: [facility];  Service: Cardiovascular;  Laterality: N/A;   ABDOMINAL AORTOGRAM W/LOWER EXTREMITY N/A 04/03/2021   Procedure: ABDOMINAL AORTOGRAM W/LOWER EXTREMITY;  Surgeon: Nada Libman, MD;  Location: [facility];  Service: Cardiovascular;  Laterality: N/A;   ABDOMINAL AORTOGRAM W/LOWER EXTREMITY N/A 10/01/2022   Procedure: ABDOMINAL AORTOGRAM W/LOWER EXTREMITY;  Surgeon: Nada Libman, MD;  Location: [facility];  Service: Cardiovascular;  Laterality: N/A;   ANTERIOR CERVICAL DECOMP/DISCECTOMY FUSION  ~ 2001-2009 X 3   ANTERIOR CERVICAL DECOMP/DISCECTOMY FUSION N/A 10/06/2012   Procedure: ANTERIOR CERVICAL DECOMPRESSION/DISCECTOMY FUSION 1 LEVEL;  Surgeon: Reinaldo Meeker, MD;  Location: [facility];  Service: Neurosurgery;  Laterality: N/A;  C7T1 anterior cervical decompression with fusion plating and bonegraft    APPLICATION OF WOUND VAC Left 11/08/2022   Procedure: APPLICATION OF WOUND VAC;  Surgeon: Nada Libman, MD;  Location: [facility];  Service: Vascular;  Laterality: Left;   BACK SURGERY     BIOPSY  11/21/2020   Procedure: BIOPSY;  Surgeon: Napoleon Form, MD;  Location: [facility];  Service: Endoscopy;;   CARDIAC CATHETERIZATION  05/16/2015   CARDIAC CATHETERIZATION N/A 05/16/2015   Procedure: Left Heart Cath and Coronary Angiography;  Surgeon: Marykay Lex, MD;  Location: [facility] INVASIVE CV LAB;  Service: Cardiovascular;  Laterality: N/A;   CARDIAC CATHETERIZATION N/A 05/16/2015   Procedure: Coronary Balloon Angioplasty;  Surgeon: Marykay Lex, MD;  Location: [facility] INVASIVE CV LAB;  Service: Cardiovascular;  Laterality: N/A;   CARDIAC CATHETERIZATION N/A 05/17/2015   Procedure: Coronary Stent Intervention Rotoblater;  Surgeon: Peter M Swaziland, MD;  Location: [facility] INVASIVE CV LAB;  Service: Cardiovascular;  Laterality: N/A;   COLONOSCOPY WITH PROPOFOL N/A 11/21/2020   Procedure: COLONOSCOPY WITH PROPOFOL;  Surgeon: Napoleon Form, MD;  Location: [facility];  Service: Endoscopy;  Laterality: N/A;   ELBOW SURGERY Left 2016   "had bone pieces removed"   ENDARTERECTOMY Left 11/08/2022   Procedure: LEFT ILIOFEMORAL ENDARTERECTOMY;  Surgeon: Nada Libman, MD;  Location: [facility] OR;  Service: Vascular;  Laterality: Left;   ENDARTERECTOMY  FEMORAL Right 12/28/2020   Procedure: RIGHT FEMORAL ARTERY ENDARTERECTOMY;  Surgeon: Nada Libman, MD;  Location: [facility] OR;  Service: Vascular;  Laterality: Right;   ESOPHAGEAL MANOMETRY N/A 12/30/2016   Procedure: ESOPHAGEAL MANOMETRY (EM);  Surgeon: Hilarie Fredrickson, MD;  Location: [facility];  Service: Endoscopy;  Laterality: N/A;   ESOPHAGOGASTRODUODENOSCOPY (EGD) WITH PROPOFOL N/A 11/21/2020   Procedure: ESOPHAGOGASTRODUODENOSCOPY (EGD) WITH PROPOFOL;  Surgeon: Napoleon Form, MD;  Location: [facility];  Service: Endoscopy;  Laterality: N/A;   EXCISIONAL HEMORRHOIDECTOMY     FEMORAL BYPASS  ~ 2010   FEMORAL-POPLITEAL BYPASS GRAFT Right 04/04/2021    Procedure: RIGHT FEMORAL-POPLITEAL ARTERY BYPASS GRAFT;  Surgeon: Nada Libman, MD;  Location: [facility];  Service: Vascular;  Laterality: Right;   IR RADIOLOGIST EVAL & MGMT  12/17/2016   IVC FILTER PLACEMENT ([facility] HX)  2010   "had blood clots in my back"   NECK SURGERY  2014   2008-2014   [facility] ANGIOPLASTY Right 12/28/2020   Procedure: PATCH ANGIOPLASTY USING;  Surgeon: Nada Libman, MD;  Location: [facility] OR;  Service: Vascular;  Laterality: Right;   PATCH ANGIOPLASTY Left 11/08/2022   Procedure: LEFT FEMORAL ARTERY PATCH ANGIOPLASTY USING HEMASHEILD PLATINUM DACRON PATCH 0.8CM X 15.2CM;  Surgeon: Nada Libman, MD;  Location: [facility] OR;  Service: Vascular;  Laterality: Left;   PERIPHERAL VASCULAR ATHERECTOMY  01/07/2018   Procedure: PERIPHERAL VASCULAR ATHERECTOMY;  Surgeon: Iran Ouch, MD;  [facility]: [facility];  Service: Cardiovascular;;   PERIPHERAL VASCULAR BALLOON ANGIOPLASTY  01/07/2018   Procedure: PERIPHERAL VASCULAR BALLOON ANGIOPLASTY;  Surgeon: Iran Ouch, MD;  [facility]: [facility];  Service: Cardiovascular;;   PERIPHERAL VASCULAR CATHETERIZATION N/A 02/14/2016   Procedure: Abdominal Aortogram w/Lower Extremity;  Surgeon: Iran Ouch, MD;  [facility]: [facility];  Service: Cardiovascular;  Laterality: N/A;   PERIPHERAL VASCULAR INTERVENTION Right 04/03/2021   Procedure: PERIPHERAL VASCULAR INTERVENTION;  Surgeon: Nada Libman, MD;  [facility];  Service: Cardiovascular;  Laterality: Right;   POLYPECTOMY  11/21/2020   Procedure: POLYPECTOMY;  Surgeon: Napoleon Form, MD;  Location: [facility];  Service: Endoscopy;;   POSTERIOR LAMINECTOMY / DECOMPRESSION LUMBAR SPINE  2006   "bone spurs"   THROMBECTOMY FEMORAL ARTERY Left 11/08/2022   Procedure: THROMBECTOMY SUPERFICIAL FEMORAL ARTERY;  Surgeon: Nada Libman, MD;  Location: [facility];  Service: Vascular;  Laterality: Left;   THROMBECTOMY ILIAC ARTERY N/A 11/08/2022    Procedure: THROMBECTOMY OF AORTA BIFEMORAL GRAFT;  Surgeon: Nada Libman, MD;  Location: [facility];  Service: Vascular;  Laterality: N/A;   VIDEO BRONCHOSCOPY WITH ENDOBRONCHIAL NAVIGATION Right 02/25/2018   Procedure: VIDEO BRONCHOSCOPY WITH ENDOBRONCHIAL NAVIGATION;  Surgeon: Josephine Igo, DO;  Location: [facility];  Service: Thoracic;  Laterality: Right;   VIDEO BRONCHOSCOPY WITH ENDOBRONCHIAL ULTRASOUND Right 02/25/2018   Procedure: VIDEO BRONCHOSCOPY WITH ENDOBRONCHIAL ULTRASOUND;  Surgeon: Josephine Igo, DO;  Location: [facility] OR;  Service: Thoracic;  Laterality: Right;     Current Outpatient Medications  Medication Sig Dispense Refill   albuterol (PROAIR HFA) 108 (90 Base) MCG/ACT inhaler USE 2 PUFFS EVERY 6 HOURS  AS NEEDED FOR WHEEZING OR  SHORTNESS OF BREATH (Patient taking differently: Inhale 2 puffs into the lungs every 6 (six) hours as needed for wheezing or shortness of breath.) 34 g 3   albuterol (PROVENTIL) (2.5 MG/3ML) 0.083% nebulizer solution Take 3 mLs (2.5 mg total) by nebulization every 6 (six) hours as  needed for wheezing or shortness of breath. 75 mL 5   aspirin EC 81 MG tablet Take 81 mg by mouth daily.     carvedilol (COREG) 6.25 MG tablet Take 1 tablet (6.25 mg total) by mouth 2 (two) times daily. 60 tablet 11   Cholecalciferol (VITAMIN D) 50 MCG (2000 UT) tablet Take 2,000 Units by mouth daily.     colchicine 0.6 MG tablet TAKE 2 TABLETS BY MOUTH AS  DIRECTED THEN 1 TABLET  AFTER 1 HOUR. CAN TAKE 1  TABLET TWICE DAILY  THEREAFTER. 60 tablet 11   EPINEPHrine (PRIMATENE MIST) 0.125 MG/ACT AERO Inhale 1 puff into the lungs daily as needed (COPD flare).     ezetimibe (ZETIA) 10 MG tablet TAKE 1 TABLET BY MOUTH DAILY 100 tablet 0   Fluticasone-Umeclidin-Vilant (TRELEGY ELLIPTA) 200-62.5-25 MCG/ACT AEPB Inhale 1 puff into the lungs every evening.     furosemide (LASIX) 20 MG tablet Take 1 tablet (20 mg total) by mouth daily. 30 tablet 3   HYDROcodone-acetaminophen (NORCO) 5-325  MG tablet Take 1 tablet by mouth every 6 (six) hours as needed for moderate pain. 15 tablet 0   olmesartan (BENICAR) 40 MG tablet Take 1 tablet (40 mg total) by mouth daily. 30 tablet 3   omeprazole (PRILOSEC) 40 MG capsule TAKE 1 CAPSULE BY MOUTH DAILY 90 capsule 0   potassium chloride (KLOR-CON M) 10 MEQ tablet Take 1 tablet (10 mEq total) by mouth daily. 30 tablet 3   pregabalin (LYRICA) 100 MG capsule Take 100 mg by mouth 3 (three) times daily.     rosuvastatin (CRESTOR) 40 MG tablet TAKE 1 TABLET BY MOUTH DAILY 30 tablet 11   traZODone (DESYREL) 100 MG tablet TAKE 1 TABLET BY MOUTH AT  BEDTIME (Patient taking differently: Take 100 mg by mouth at bedtime as needed for sleep.) 90 tablet 2   No current facility-administered medications for this visit.    Allergies:   Zestril [lisinopril], Contrast media [iodinated contrast media], Elavil [amitriptyline], Gadolinium derivatives, and Motrin [ibuprofen]    Social History:  The patient  reports that he has been smoking cigarettes. He started smoking about 51 years ago. He has a 24.5 pack-year smoking history. He has been exposed to tobacco smoke. He has never used smokeless tobacco. He reports that he does not currently use alcohol. He reports that he does not use drugs.   Family History:  The patient's family history includes Aneurysm in his brother; Healthy in his daughter and maternal grandmother; Heart attack in his father; Hypertension in his father and mother; Leukemia in his grandchild; Stomach cancer in his mother; Stroke in his father.    ROS:  Please see the history of present illness.   Otherwise, review of systems are positive for none.   All other systems are reviewed and negative.    PHYSICAL EXAM: VS:  BP 120/62 (BP Location: Left Arm, Patient Position: Sitting, Cuff Size: Normal)   Pulse (!) 53   Ht 5\' 9"  (1.753 m)   Wt 227 lb (103 kg)   SpO2 95%   BMI 33.52 kg/m  , BMI Body mass index is 33.52 kg/m. GEN: Well nourished,  well developed, in no acute distress  HEENT: normal  Neck: no JVD, carotid bruits, or masses Cardiac: RRR; no murmurs, rubs, or gallops,no edema  Respiratory: Bilateral rhonchi and mild expiratory wheezing. GI: soft, nontender, nondistended, + BS MS: no deformity or atrophy  Skin: warm and dry, no rash Neuro:  Strength and sensation  are intact Psych: euthymic mood, full affect    EKG:  EKG is not ordered today.   Recent Labs: 06/12/2022: TSH 1.901 06/19/2022: Magnesium 1.8 10/31/2022: ALT 11 11/09/2022: Hemoglobin 13.7; Platelets 178 01/17/2023: BNP 294.2 02/06/2023: BUN 11; Creatinine, Ser 1.17; Potassium 4.0; Sodium 145    Lipid Panel    Component Value Date/Time   CHOL 100 11/09/2022 0247   CHOL 215 (H) 11/21/2017 0810   TRIG 87 11/09/2022 0247   HDL 35 (L) 11/09/2022 0247   HDL 126 11/21/2017 0810   CHOLHDL 2.9 11/09/2022 0247   VLDL 17 11/09/2022 0247   LDLCALC 48 11/09/2022 0247   LDLCALC 75 07/09/2019 1435   LDLDIRECT 107.0 04/21/2015 1352      Wt Readings from Last 3 Encounters:  02/18/23 227 lb (103 kg)  01/17/23 235 lb (106.6 kg)  01/13/23 236 lb 4.8 oz (107.2 kg)         ASSESSMENT AND PLAN:  1.   Peripheral arterial disease: Status post aortobifemoral bypass, right common femoral artery endarterectomy, right femoral-popliteal bypass and most recently left common femoral artery endarterectomy.  Currently with no claudication.  Continue medical therapy.  2. Coronary artery disease: Status post atherectomy and drug-eluting stent placement to the LAD.  The patient has no angina.  Continue low-dose aspirin.  3. Tobacco use: He cut down to half a pack per day but has not been able to quit completely.  I discussed with him the importance of smoking cessation.  4. Hyperlipidemia: Continue treatment with ezetimibe and rosuvastatin.  Most recent lipid profile showed an LDL of 48.  5.  Essential hypertension: Blood pressure is well  controlled.      Disposition:   FU with me in 6 months.  Signed,  Lorine Bears, MD  02/18/2023 8:36 AM    [facility]

## 2023-02-18 ENCOUNTER — Encounter: Payer: Self-pay | Admitting: Cardiovascular Disease

## 2023-02-18 ENCOUNTER — Ambulatory Visit: Payer: Medicare Other | Attending: Cardiovascular Disease | Admitting: Cardiovascular Disease

## 2023-02-18 VITALS — BP 120/62 | HR 53 | Ht 69.0 in | Wt 227.0 lb

## 2023-02-18 DIAGNOSIS — I739 Peripheral vascular disease, unspecified: Secondary | ICD-10-CM

## 2023-02-18 DIAGNOSIS — I10 Essential (primary) hypertension: Secondary | ICD-10-CM | POA: Diagnosis not present

## 2023-02-18 DIAGNOSIS — E785 Hyperlipidemia, unspecified: Secondary | ICD-10-CM | POA: Diagnosis not present

## 2023-02-18 DIAGNOSIS — I2510 Atherosclerotic heart disease of native coronary artery without angina pectoris: Secondary | ICD-10-CM

## 2023-02-18 DIAGNOSIS — Z720 Tobacco use: Secondary | ICD-10-CM

## 2023-02-18 MED ORDER — FUROSEMIDE 20 MG PO TABS: 20.0000 mg | ORAL_TABLET | Freq: Every day | ORAL | 3 refills | Status: DC

## 2023-02-18 MED ORDER — POTASSIUM CHLORIDE CRYS ER 10 MEQ PO TBCR: 10.0000 meq | EXTENDED_RELEASE_TABLET | Freq: Every day | ORAL | 3 refills | Status: DC

## 2023-02-18 MED ORDER — OLMESARTAN MEDOXOMIL 40 MG PO TABS: 40.0000 mg | ORAL_TABLET | Freq: Every day | ORAL | 3 refills | Status: DC

## 2023-02-18 MED ORDER — CARVEDILOL 6.25 MG PO TABS: 6.2500 mg | ORAL_TABLET | Freq: Two times a day (BID) | ORAL | 3 refills | Status: DC

## 2023-02-18 NOTE — Patient Instructions (Signed)
Medication Instructions:  No changes *If you need a refill on your cardiac medications before your next appointment, please call your pharmacy*   Lab Work: None ordered If you have labs (blood work) drawn today and your tests are completely normal, you will receive your results only by: MyChart Message (if you have MyChart) OR A paper copy in the mail If you have any lab test that is abnormal or we need to change your treatment, we will call you to review the results.   Testing/Procedures: None ordered   Follow-Up: At [facility], you and your health needs are our priority.  As part of our continuing mission to provide you with exceptional heart care, we have created designated Provider Care Teams.  These Care Teams include your primary Cardiologist (physician) and Advanced Practice Providers (APPs -  Physician Assistants and Nurse Practitioners) who all work together to provide you with the care you need, when you need it.  We recommend signing up for the patient portal called "MyChart".  Sign up information is provided on this After Visit Summary.  MyChart is used to connect with patients for Virtual Visits (Telemedicine).  Patients are able to view lab/test results, encounter notes, upcoming appointments, etc.  Non-urgent messages can be sent to your provider as well.   To learn more about what you can do with MyChart, go to [email or web address].    Your next appointment:   6 month(s)  Provider:   Dr. Arida  

## 2023-03-21 ENCOUNTER — Other Ambulatory Visit: Payer: Self-pay | Admitting: Cardiovascular Disease

## 2023-04-29 ENCOUNTER — Telehealth: Payer: Self-pay | Admitting: *Deleted

## 2023-04-29 NOTE — Telephone Encounter (Signed)
PT calling to say he has an appt w/Ms. Groce in April and was a cancer PT. He is wondering if he should have a CT done before he see's Ms. Alexandria Lodge. Last seen over 3 years ago by our office. Please call to advise @ [phone]

## 2023-04-30 NOTE — Telephone Encounter (Signed)
Spoke to patient's spouse, Judy(DPR). She is aware that an appt is needed prior to ordering imaging. Pt will keep scheduled visit for 4/11. Nothing further needed.

## 2023-06-27 ENCOUNTER — Ambulatory Visit: Payer: Medicare Other | Admitting: Acute Care

## 2023-06-27 ENCOUNTER — Encounter: Payer: Self-pay | Admitting: Acute Care

## 2023-06-27 VITALS — BP 143/59 | HR 53 | Ht 69.0 in | Wt 229.0 lb

## 2023-06-27 DIAGNOSIS — J449 Chronic obstructive pulmonary disease, unspecified: Secondary | ICD-10-CM

## 2023-06-27 DIAGNOSIS — F17210 Nicotine dependence, cigarettes, uncomplicated: Secondary | ICD-10-CM | POA: Diagnosis not present

## 2023-06-27 DIAGNOSIS — Z923 Personal history of irradiation: Secondary | ICD-10-CM

## 2023-06-27 DIAGNOSIS — R911 Solitary pulmonary nodule: Secondary | ICD-10-CM

## 2023-06-27 DIAGNOSIS — Z85118 Personal history of other malignant neoplasm of bronchus and lung: Secondary | ICD-10-CM

## 2023-06-27 DIAGNOSIS — F17200 Nicotine dependence, unspecified, uncomplicated: Secondary | ICD-10-CM

## 2023-06-27 DIAGNOSIS — Z902 Acquired absence of lung [part of]: Secondary | ICD-10-CM

## 2023-06-27 MED ORDER — ALBUTEROL SULFATE HFA 108 (90 BASE) MCG/ACT IN AERS: 1.0000 | INHALATION_SPRAY | Freq: Four times a day (QID) | RESPIRATORY_TRACT | 1 refills | Status: AC | PRN

## 2023-06-27 MED ORDER — TRELEGY ELLIPTA 200-62.5-25 MCG/ACT IN AEPB: 1.0000 | INHALATION_SPRAY | Freq: Every day | RESPIRATORY_TRACT | Status: AC

## 2023-06-27 NOTE — Progress Notes (Signed)
 History of Present Illness Joshua [name] is a 68 y.o. male current everyday smoker referred October 2019 to Dr. Tonia Brooms for COPD by Ovidio Hanger nurse practitioner.  Past medical history significant for lung cancer diagnosed December 2019 for which patient received SBRT treatment.  Patient was last seen in the office by Dr. Tonia Brooms in 2021.  He was lost to follow-up for serial CT scans and returns today wanting to reinitiate surveillance CTs.  Synopsis PMH of tobacco abuse, smoked since age of 79, at heaviest was 1ppd and now down to 0.5ppd. History of back surgery complicated by DVT.  He is still continuing to smoke and he knows that he needs to quit but he has been unable to stop. He has been incarcerated x2 each time for a little less than a year. 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 He denies IV drug use history. 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  He was considered for surgical treatment however pulmonary function tests did not support this option for treatment.  He decided against lobectomy after concerns of surgery and evaluation by thoracic surgery.  He underwent SBRT as treatment and did well.  Patient was lost to follow-up by this office after the November 2021 visit.  Patient states this is most likely because of the COVID pandemic.  He is here today to reestablish with the practice and to maintain surveillance imaging of the lung status post his adenocarcinoma diagnosis in 2019.  Of note patient is continuing to smoke.  He started smoking at the age of 86, he has at least a 20-pack-year smoking history.   06/27/2023 Joshua [name] is a 68 year old male with history of right lung cancer who presents for follow-up and repeat CT scan.  He has a history of right lung cancer, previously treated with stereotactic body radiation  therapy (SBRT) . His last CT scan in 2021 showed the lesion was shrinking. He is here for a follow-up and to obtain a repeat CT scan to monitor the status of the lesion.  He has chronic obstructive pulmonary disease (COPD) and uses albuterol and Trelegy inhalers. Albuterol provides significant relief, and he uses it every two to three days. He is currently low on albuterol. Trelegy is also used and found effective. He experiences a lot of mucus production and nasal drainage, which he attributes to COPD and possibly allergies. He has tried Claritin without significant relief and has had allergy shots in the past. Nasal drainage occurs year-round, mostly clear but occasionally yellowish or greenish, particularly in cold weather. No recent weight loss or hemoptysis, but he reports intentional weight loss of eight pounds.  We discussed that it has been about 4 years since patient had CT imaging of his chest.  We discussed that we should obtain a CT of the chest now for new baseline, and a surveillance of his previous treatment for lung cancer.  Patient is in agreement with this plan.  He will follow-up with me after the scan to review results and determine next best steps in his plan of care.  He will need continued follow-up for his COPD.  He states he is compliant with his Trelegy daily .  He states he is out of his albuterol, and that he does use this about twice daily.  Test Results: 08/06/2019  Mild paraseptal emphysema. Diffuse bilateral bronchial wall thickening and scattered plugging. Interval continued development of bandlike consolidation about a previously seen cavitary lesion of the anterior right upper lobe. No pleural effusion or pneumothorax. Interval continued development of bandlike consolidation about a previously seen cavitary lesion of the anterior right upper lobe, consistent with evolving post radiation change. Continued attention on follow-up. 2. Diffuse bilateral bronchial wall  thickening and scattered plugging, consistent with nonspecific infectious or inflammatory bronchitis. 3. Emphysema (ICD10-J43.9). 4. Coronary artery disease. Aortic Atherosclerosis (ICD10-I70.0). 5. Hepatic steatosis.     Latest Ref Rng & Units 11/09/2022    2:47 AM 11/08/2022    6:40 PM 11/08/2022    3:01 PM  CBC  WBC 4.0 - 10.5 K/uL 15.0  12.2    Hemoglobin 13.0 - 17.0 g/dL 16.1  09.6  04.5   Hematocrit 39.0 - 52.0 % 41.1  43.0  38.0   Platelets 150 - 400 K/uL 178  151         Latest Ref Rng & Units 02/06/2023   10:54 AM 01/17/2023    3:42 PM 11/09/2022    2:47 AM  BMP  Glucose 70 - 99 mg/dL 45  64  409   BUN 8 - 27 mg/dL 11  10  17    Creatinine 0.76 - 1.27 mg/dL 8.11  9.14  7.82   BUN/Creat Ratio 10 - 24 9  9     Sodium 134 - 144 mmol/L 145  143  135   Potassium 3.5 - 5.2 mmol/L 4.0  3.8  4.9   Chloride 96 - 106 mmol/L 100  99  105   CO2 20 - 29 mmol/L 23  30  22    Calcium 8.6 - 10.2 mg/dL 8.8  8.6  8.2     BNP    Component Value Date/Time   BNP 294.2 (H) 01/17/2023 1542   BNP 238.7 (H) 06/17/2022 0722    ProBNP No results found for: "PROBNP"  PFT    Component Value Date/Time   FEV1PRE 1.75 02/05/2018 1347   FEV1POST 2.09 02/05/2018 1347   FVCPRE 3.05 02/05/2018 1347   FVCPOST 3.30 02/05/2018 1347   TLC 7.59 02/05/2018 1347   DLCOUNC 23.38 02/05/2018 1347   PREFEV1FVCRT 58 02/05/2018 1347   PSTFEV1FVCRT 63 02/05/2018 1347    No results found.   Past medical hx Past Medical History:  Diagnosis Date   AC (acromioclavicular) joint bone spurs, unspecified laterality 11/25/2016   Arthritis    "back" (05/16/2015)   CAD in native artery, occluded Lcx and rotational atherectomy to LAD with DES 05/17/15 05/18/2015   Cavitary lesion of lung 02/25/2018   Chronic back pain    "all my back"   Chronic bronchitis (HCC)    COPD (chronic obstructive pulmonary disease) (HCC)    Diabetes mellitus without complication (HCC)    pt states he is no longer diabetic, no  medications   GERD (gastroesophageal reflux disease)    H/O blood clots    s/p IVC filter ~ 2010: per IR 12/17/16, would not recommended retrieval unless filterl causes new concerns before one of my neck ORs"   History of gout    Hyperlipidemia    Hypertension    Lung cancer (HCC) 02/25/2018   bronchoscopy, RUL + non-small cell carcinoma   Neuromuscular disorder (HCC)    PAD (peripheral artery disease) (HCC)    Phlebitis    Positive TB test    Seasonal allergies    Shortness of breath  Spinal disease      Social History   Tobacco Use   Smoking status: Every Day    Current packs/day: 0.00    Average packs/day: 0.5 packs/day for 49.0 years (24.5 ttl pk-yrs)    Types: Cigarettes    Start date: 02/10/1972    Last attempt to quit: 02/09/2021    Years since quitting: 2.3    Passive exposure: Current (Wife is a Smoker)   Smokeless tobacco: Never   Tobacco comments:    half a pack a day   Vaping Use   Vaping status: Never Used  Substance Use Topics   Alcohol use: Not Currently   Drug use: No    Mr.Clearman reports that he has been smoking cigarettes. He started smoking about 51 years ago. He has a 24.5 pack-year smoking history. He has been exposed to tobacco smoke. He has never used smokeless tobacco. He reports that he does not currently use alcohol. He reports that he does not use drugs.  Tobacco Cessation: Ready to quit: Not Answered Counseling given: Not Answered Tobacco comments: half a pack a day  Current everyday smoker Counseled approximately 3 to 4 minutes today on the need to quit smoking completely.  Patient has been offered options of nicotine replacement, hypnosis, and acupuncture to help in his smoking cessation journey.  He verbalized understanding of the need to quit smoking completely for his health.  Past surgical hx, Family hx, Social hx all reviewed.  Current Outpatient Medications on File Prior to Visit  Medication Sig   albuterol (PROAIR HFA) 108  (90 Base) MCG/ACT inhaler USE 2 PUFFS EVERY 6 HOURS  AS NEEDED FOR WHEEZING OR  SHORTNESS OF BREATH (Patient taking differently: Inhale 2 puffs into the lungs every 6 (six) hours as needed for wheezing or shortness of breath.)   albuterol (PROVENTIL) (2.5 MG/3ML) 0.083% nebulizer solution Take 3 mLs (2.5 mg total) by nebulization every 6 (six) hours as needed for wheezing or shortness of breath.   aspirin EC 81 MG tablet Take 81 mg by mouth daily.   carvedilol (COREG) 6.25 MG tablet Take 1 tablet (6.25 mg total) by mouth 2 (two) times daily.   Cholecalciferol (VITAMIN D) 50 MCG (2000 UT) tablet Take 2,000 Units by mouth daily.   colchicine 0.6 MG tablet TAKE 2 TABLETS BY MOUTH AS  DIRECTED THEN 1 TABLET  AFTER 1 HOUR. CAN TAKE 1  TABLET TWICE DAILY  THEREAFTER.   EPINEPHrine (PRIMATENE MIST) 0.125 MG/ACT AERO Inhale 1 puff into the lungs daily as needed (COPD flare).   ezetimibe (ZETIA) 10 MG tablet TAKE 1 TABLET BY MOUTH DAILY   Fluticasone-Umeclidin-Vilant (TRELEGY ELLIPTA) 200-62.5-25 MCG/ACT AEPB Inhale 1 puff into the lungs every evening.   HYDROcodone-acetaminophen (NORCO) 5-325 MG tablet Take 1 tablet by mouth every 6 (six) hours as needed for moderate pain.   olmesartan (BENICAR) 40 MG tablet Take 1 tablet (40 mg total) by mouth daily.   omeprazole (PRILOSEC) 40 MG capsule TAKE 1 CAPSULE BY MOUTH DAILY   potassium chloride (KLOR-CON M) 10 MEQ tablet Take 1 tablet (10 mEq total) by mouth daily.   pregabalin (LYRICA) 100 MG capsule Take 100 mg by mouth 3 (three) times daily.   rosuvastatin (CRESTOR) 40 MG tablet TAKE 1 TABLET BY MOUTH DAILY   traZODone (DESYREL) 100 MG tablet TAKE 1 TABLET BY MOUTH AT  BEDTIME (Patient taking differently: Take 100 mg by mouth at bedtime as needed for sleep.)   furosemide (LASIX) 20 MG tablet Take  1 tablet (20 mg total) by mouth daily.   No current facility-administered medications on file prior to visit.     Allergies  Allergen Reactions   Zestril  [Lisinopril] Hives and Itching   Contrast Media [Iodinated Contrast Media] Hives and Itching    OK if pt has Benadryl pre-med.   Elavil [Amitriptyline] Nausea Only and Other (See Comments)    Makes pt "feel weird"   Gadolinium Derivatives Hives and Itching   Motrin [Ibuprofen] Other (See Comments)    Internal bleeding    Review Of Systems:  Constitutional:   + Intentional weight loss, No night sweats,  Fevers, chills, fatigue, or  lassitude.  HEENT:   No headaches,  Difficulty swallowing,  Tooth/dental problems, or  Sore throat,                No sneezing, itching, ear ache, ++nasal congestion, ++post nasal drip,   CV:  No chest pain,  Orthopnea, PND, swelling in lower extremities, anasarca, dizziness, palpitations, syncope.   GI  No heartburn, indigestion, abdominal pain, nausea, vomiting, diarrhea, change in bowel habits, loss of appetite, bloody stools.   Resp: + shortness of breath with exertion less at rest.  + baseline  excess mucus, + baseline  productive cough,  No non-productive cough,  No coughing up of blood.  No change in color of mucus.  + occasional  wheezing.  No chest wall deformity  Skin: no rash or lesions.  GU: no dysuria, change in color of urine, no urgency or frequency.  No flank pain, no hematuria   MS:  No joint pain or swelling.  No decreased range of motion.  ++ back pain.(Patient is on disability secondary to back pain).  Psych:  No change in mood or affect. No depression or anxiety.  No memory loss.   Vital Signs BP (!) 143/59 (BP Location: Left Arm, Patient Position: Sitting, Cuff Size: Large)   Pulse (!) 53   Ht 5\' 9"  (1.753 m)   Wt 229 lb (103.9 kg)   SpO2 95%   BMI 33.82 kg/m    Physical Exam:  General- No distress,  A&Ox3, pleasant, cooperative and appropriate ENT: No sinus tenderness, TM clear, pale nasal mucosa, no oral exudate,no post nasal drip, no LAN Cardiac: S1, S2, regular rate and rhythm, no murmur Chest: No wheeze/ rales/  dullness; no accessory muscle use, no nasal flaring, no sternal retractions, diminished per bases, few rhonchi that clear with cough Abd.: Soft Non-tender, nondistended, bowel sounds positive,Body mass index is 33.82 kg/m.  Ext: No clubbing cyanosis, edema, no obvious deformities Neuro:  normal strength, moving all extremities x 4, alert and oriented x 3, appropriate Skin: No rashes, warm and dry, no obvious skin lesions Psych: normal mood and behavior   Assessment/Plan History of adenocarcinoma of the right upper lobe 2019 Treated with SBRT Lost to follow-up after 2021 imaging due to COVID Current everyday smoker Postnasal drip questionable etiology seasonal allergies COPD on Trelegy Plan We will do a repeat CT chest to evaluate the nodule Dr. Tonia Brooms has been watching.  You will get a call to get this scheduled.  You will follow up with me 1-2 weeks after the scan to review the results. You will get a call to get this scheduled.  Try Xyzol over the counter for your nasal drainage.  Continue Albuterol and Trelegy as you have been doing. I have sent in an albuterol today. Rinse mouth after use. We will give you a few samples  of Trelegy 200  today.  Follow up as needed  Note your daily symptoms > remember "red flags" for COPD:  Increase in cough, increase in sputum production, increase in shortness of breath or activity intolerance. If you notice these symptoms, please call to be seen.     I spent 30 minutes dedicated to the care of this patient on the date of this encounter to include pre-visit review of records, face-to-face time with the patient discussing conditions above, post visit ordering of testing, clinical documentation with the electronic health record, making appropriate referrals as documented, and communicating necessary information to the patient's healthcare team.   Bevelyn Ngo, NP 06/27/2023  9:26 AM

## 2023-06-27 NOTE — Patient Instructions (Addendum)
 It is good to see you today. We will do a repeat CT chest to evaluate the nodule Dr. Tonia Brooms has been watching.  You will get a call to get this scheduled.  You will follow up with me 1-2 weeks after the scan to review the results. You will get a call to get this scheduled.  Try Xyzol over the counter for your nasal drainage.  Continue Albuterol and Trelegy as you have been doing. I have sent in an albuterol today. Rinse mouth after use. We will give you a few samples of Trelegy 200  today.  Follow up as needed  Note your daily symptoms > remember "red flags" for COPD:  Increase in cough, increase in sputum production, increase in shortness of breath or activity intolerance. If you notice these symptoms, please call to be seen.

## 2023-07-10 ENCOUNTER — Other Ambulatory Visit

## 2023-07-17 ENCOUNTER — Ambulatory Visit
Admission: RE | Admit: 2023-07-17 | Discharge: 2023-07-17 | Disposition: A | Discharge status: Home / Self Care | Source: Ambulatory Visit | Attending: Acute Care | Admitting: Acute Care

## 2023-07-17 DIAGNOSIS — R911 Solitary pulmonary nodule: Secondary | ICD-10-CM

## 2023-07-24 ENCOUNTER — Telehealth: Payer: Self-pay | Admitting: *Deleted

## 2023-07-24 NOTE — Telephone Encounter (Signed)
 Call Report:  IMPRESSION: 1. Focal opacity in the inferior right upper lobe extending to the hilum has increased in size and is now more dense/masslike measuring 4.5 x 3.4 x 1.7 cm. Margins are slightly spiculated and there is tethering to the peripheral pleura. Findings are worrisome for malignancy. 2. Central peribronchial wall thickening again seen compatible with bronchitis. 3. Cholelithiasis.

## 2023-07-24 NOTE — Telephone Encounter (Signed)
 [facility] calling with call report.

## 2023-07-25 ENCOUNTER — Telehealth: Payer: Self-pay

## 2023-07-25 ENCOUNTER — Telehealth: Payer: Self-pay | Admitting: Acute Care

## 2023-07-25 ENCOUNTER — Other Ambulatory Visit: Payer: Self-pay | Admitting: Acute Care

## 2023-07-25 DIAGNOSIS — R911 Solitary pulmonary nodule: Secondary | ICD-10-CM

## 2023-07-25 NOTE — Telephone Encounter (Signed)
 I have called Mr. Lodato with the results of his CT chest.  CT chest done 07/17/2023 showed the right upper lobe opacity extending to the hilum which appears to have increased in size and become more dense and masslike currently measuring 4.5 x 3.4 x 1.7 cm.  Per radiology the margins are slightly spiculated and there is a tethering to the pleura.  Findings are worrisome for malignancy. As patient does have a history of right upper lobe adenocarcinoma diagnosed in 2019, previously treated with SBRT as patient was not a surgical candidate, I am concerned about this finding.  Patient is currently scheduled to see me in June however we will get this appointment moved up as soon as possible.  Additionally we will order PET scan now to better evaluate the finding.  I explained this to the patient and he verbally agreed to the PET scan now  with earlier follow-up in the office.    PET scan has been ordered , patient is aware he will get a call to get this scheduled.    Margretta Shi, as we discussed on Friday please see if we can get patient into either a cancellation or an a.m. office visit with me on 08/04/2023. Please call the patient and his wife and let them know.  Thank you so much

## 2023-07-25 NOTE — Telephone Encounter (Signed)
 Provider notified and will return call.

## 2023-07-25 NOTE — Telephone Encounter (Signed)
 Lung nodule pool please advise     Copied from CRM [id number]. Topic: Clinical - Lab/Test Results >> Jul 25, 2023  8:54 AM [name] wrote: Reason for CRM:  Joshua [name] (spouse) missed a call and called back. I did not see why they were called. Please return the call at [phone]. Joshua [name] states it may be in regard to lung test he had recently.

## 2023-07-28 ENCOUNTER — Other Ambulatory Visit: Payer: Self-pay

## 2023-07-28 NOTE — Telephone Encounter (Signed)
 Thank you :)

## 2023-07-28 NOTE — Telephone Encounter (Signed)
 See provider note 07/25/2023

## 2023-07-28 NOTE — Telephone Encounter (Signed)
 PET scan has been scheduled on 08/01/2023 and F/U appt 5/20, if a sooner appt opens, we will move him up.

## 2023-07-29 ENCOUNTER — Ambulatory Visit: Admitting: Acute Care

## 2023-08-01 ENCOUNTER — Encounter (HOSPITAL_COMMUNITY)
Admission: RE | Admit: 2023-08-01 | Discharge: 2023-08-01 | Disposition: A | Discharge status: Home / Self Care | Source: Ambulatory Visit | Attending: Acute Care | Admitting: Acute Care

## 2023-08-01 DIAGNOSIS — R911 Solitary pulmonary nodule: Secondary | ICD-10-CM | POA: Diagnosis present

## 2023-08-01 LAB — GLUCOSE, CAPILLARY: Glucose-Capillary: 85 mg/dL (ref 70–99)

## 2023-08-01 MED ORDER — FLUDEOXYGLUCOSE F - 18 (FDG) INJECTION
11.4600 | Freq: Once | INTRAVENOUS | Status: AC | PRN
  Administered 2023-08-01: 11.46 via INTRAVENOUS

## 2023-08-05 ENCOUNTER — Ambulatory Visit: Admitting: Acute Care

## 2023-08-05 ENCOUNTER — Encounter: Payer: Self-pay | Admitting: Acute Care

## 2023-08-05 ENCOUNTER — Encounter: Payer: Self-pay | Admitting: Emergency Medicine

## 2023-08-05 VITALS — BP 136/78 | HR 47 | Ht 69.0 in | Wt 236.0 lb

## 2023-08-05 DIAGNOSIS — F17200 Nicotine dependence, unspecified, uncomplicated: Secondary | ICD-10-CM

## 2023-08-05 DIAGNOSIS — R911 Solitary pulmonary nodule: Secondary | ICD-10-CM | POA: Diagnosis not present

## 2023-08-05 DIAGNOSIS — F17210 Nicotine dependence, cigarettes, uncomplicated: Secondary | ICD-10-CM | POA: Diagnosis not present

## 2023-08-05 DIAGNOSIS — E1140 Type 2 diabetes mellitus with diabetic neuropathy, unspecified: Secondary | ICD-10-CM

## 2023-08-05 DIAGNOSIS — Z85118 Personal history of other malignant neoplasm of bronchus and lung: Secondary | ICD-10-CM | POA: Diagnosis not present

## 2023-08-05 NOTE — H&P (View-Only) (Signed)
 History of Present Illness Joshua [name] is a 68 y.o. male current everyday smoker referred October 2019 to Dr. Thelda [name] for COPD by Joshua [name] nurse practitioner. Past medical history significant for lung cancer diagnosed December 2019 for which patient received SBRT treatment. Patient was last seen in the office by Dr. Thelda [name] in 2021. He was lost to follow-up for serial CT scans , he re-established with us  06/2023, and repeat imaging was done.  CT Chest was concerning for Focal opacity in the inferior right upper lobe extending to the hilum has increased in size and is now more dense/masslike measuring 4.5 x 3.4 x 1.7 cm. Margins are slightly spiculated and there is tethering to the peripheral pleura. Findings are worrisome for malignancy. PET scan was ordered. Pt is here today to follow up on the results.  Pt. Has consented to use of [employer] soft wear to help capture the content of this OV   08/05/2023 OV Pt. Presents for follow up of PET scan results to better evaluate a lung nodule.   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 He presents with concerns about cancer recurrence based on repeat CT Imaging.  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 He underwent three radiation treatments and SBRT at that time, which resulted in tumor shrinkage. He is now concerned about potential tumor growth and recurrence, especially given his family history of cancer, as his mother died from stomach cancer.  We have reviewed his PET scan. The scan shows that the consolidative nodular area identified once again in the perihilar right upper lobe has low level abnormal uptake maximum SUV of 3.9. Radiology gives the option of a 6-12 month follow up scan,however with the patient's recent history of non small cell lung cancer in  2019, we will take a more aggressive approach and plan for bronchoscopy with biopsies. Both patient and his wife are in agreement with this plan.  He states from a breathing perspective he is at  his baseline.No hemoptysis. He reports significant mucus production from his sinuses and lungs.No weight loss, but has had weight gain of 7 lbs in 3 weeks. I have asked him to follow up with cardiology.   He is currently using Trelegy inhaler once daily and occasionally uses albuterol , particularly when experiencing tightness or after physical exertion. He also takes 81 mg of aspirin  daily, which he plans to hold before an upcoming procedure.  Test Results:  PET scan 08/01/2023 IMPRESSION: Consolidative nodular area identified once again in the perihilar right upper lobe has low level abnormal uptake maximum SUV of 3.9. Again there is a differential. This could be infectious or inflammatory. Low-grade malignancy is difficult to completely exclude. Follow-up options include follow-up CT imaging in 6-12 weeks versus a more aggressive approach including sampling or bronchoscopy. Please correlate with clinical presentation.   No additional areas of abnormal uptake including along lymph node chains.   Ancillary findings include extensive vascular calcifications. Colonic diverticula. Aorto bi femoral bypass graft. IVC filter.   Gallstones.   07/17/2023 CT Chest Focal opacity in the inferior right upper lobe extending to the hilum has increased in size and is now more dense/masslike measuring 4.5 x 3.4 x 1.7 cm. Margins are slightly spiculated and there is tethering to the peripheral pleura. Findings are  worrisome for malignancy. 2. Central peribronchial wall thickening again seen compatible with bronchitis. 3. Cholelithiasis.      Cytology 02/25/2018 Diagnosis 1. Lung, biopsy, Right Upper Lobe - NON-SMALL CELL CARCINOMA, SEE COMMENT. 2. Lung, biopsy, Right upper lobe - BRONCHIAL MUCOSA  WITH NO SIGNIFICANT PATHOLOGIC FINDINGS. Microscopic Comment 1. Immunohistochemistry for TTF-1 and Napsin-A is positive and PAX 8 is negative, consistent with pulmonary adenocarcinoma. Results reported to Joshua [name] on 02/27/2018. Intradepartmental consultation was obtained (Joshua [name] - 1).    Latest Ref Rng & Units 11/09/2022    2:47 AM 11/08/2022    6:40 PM 11/08/2022    3:01 PM  CBC  WBC 4.0 - 10.5 K/uL 15.0  12.2    Hemoglobin 13.0 - 17.0 g/dL 16.1  09.6  04.5   Hematocrit 39.0 - 52.0 % 41.1  43.0  38.0   Platelets 150 - 400 K/uL 178  151         Latest Ref Rng & Units 02/06/2023   10:54 AM 01/17/2023    3:42 PM 11/09/2022    2:47 AM  BMP  Glucose 70 - 99 mg/dL 45  64  409   BUN 8 - 27 mg/dL 11  10  17    Creatinine 0.76 - 1.27 mg/dL 8.11  9.14  7.82   BUN/Creat Ratio 10 - 24 9  9     Sodium 134 - 144 mmol/L 145  143  135   Potassium 3.5 - 5.2 mmol/L 4.0  3.8  4.9   Chloride 96 - 106 mmol/L 100  99  105   CO2 20 - 29 mmol/L 23  30  22    Calcium  8.6 - 10.2 mg/dL 8.8  8.6  8.2     BNP    Component Value Date/Time   BNP 294.2 (H) 01/17/2023 1542   BNP 238.7 (H) 06/17/2022 0722    ProBNP No results found for: "PROBNP"  PFT    Component Value Date/Time   FEV1PRE 1.75 02/05/2018 1347   FEV1POST 2.09 02/05/2018 1347   FVCPRE 3.05 02/05/2018 1347   FVCPOST 3.30 02/05/2018 1347   TLC 7.59 02/05/2018 1347   DLCOUNC 23.38 02/05/2018 1347   PREFEV1FVCRT 58 02/05/2018 1347   PSTFEV1FVCRT 63 02/05/2018 1347    NM PET Image Initial (PI) Skull Base To Thigh Result Date: 08/04/2023 CLINICAL DATA:  Initial treatment strategy for lung nodule. EXAM: NUCLEAR MEDICINE PET SKULL BASE TO THIGH TECHNIQUE: 11.46 mCi F-18 FDG was injected intravenously. Full-ring PET imaging was performed from the skull base to thigh after the radiotracer. CT data was obtained and used for attenuation correction and anatomic localization. Fasting blood glucose: 85 mg/dl COMPARISON:  CT chest  without contrast 07/17/2023 PET-CT 02/09/2018 FINDINGS: Mediastinal blood pool activity: SUV max 2.6 Liver activity: SUV max 3.2 NECK: There is no specific abnormal uptake seen in the neck including along lymph node change of the submandibular, posterior triangle or internal jugular region. Near symmetric uptake along the visualized intracranial compartment. Incidental CT findings: The parotid glands, submandibular glands and thyroid gland are unremarkable. Visualized paranasal sinuses and mastoid air cells are clear. Diffuse vascular calcifications are seen. Streak artifact related to the patient's spinal fixation hardware. Chest: No abnormal uptake seen above blood pool in the axillary regions, hilum or mediastinum. On the prior CT scan from 07/17/2023 was a nodular infiltrative area seen measuring 4.5 x 3.4 cm in the axial plane. This area today measured in a similar fashion measures 4.6 x 3.0 cm. Uptake in  this area has maximum SUV of 3.9. No other areas of uptake along the lungs. Incidental CT findings: Heart is nonenlarged. No pericardial effusion. Significant coronary artery calcifications. The thoracic aorta is normal course and caliber with scattered vascular calcifications. No pleural effusion or pneumothorax. Apical subpleural blebs identified. Noncalcified nodule identified in the anterior aspect of the right lower lobe on image 104 measuring 6 mm. This has been present since 2019 it is not show any abnormal uptake. No additional imaging follow-up of this nodule. Abdomen and pelvis:: There is physiologic distribution radiotracer along the parenchymal organs, bowel and renal collecting systems. Incidental CT findings: Large bowel is normal course and caliber. Scattered colonic stool. Normal appendix in the right lower quadrant. Few sigmoid colon diverticula. Stomach is underdistended and has slight wall thickening, nonspecific. Please correlate with any symptoms. Small bowel is nondilated. Stones in the  non dilated gallbladder. No renal or ureteral stones. Grossly the spleen, pancreas and adrenal glands are preserved. Few prominent less than 1 cm size retroperitoneal nodes identified, not pathologic by size criteria. Surgical changes of aorto bi femoral bypass graft. IVC filter noted. SKELETON: No abnormal uptake seen along the visualized osseous structures. Incidental CT findings: Scattered degenerative changes along the spine and pelvis. Streak artifact related to the patient's dental hardware. There is soft tissue thickening along the inguinal regions which is likely postsurgical. IMPRESSION: Consolidative nodular area identified once again in the perihilar right upper lobe has low level abnormal uptake maximum SUV of 3.9. Again there is a differential. This could be infectious or inflammatory. Low-grade malignancy is difficult to completely exclude. Follow-up options include follow-up CT imaging in 6-12 weeks versus a more aggressive approach including sampling or bronchoscopy. Please correlate with clinical presentation. No additional areas of abnormal uptake including along lymph node chains. Ancillary findings include extensive vascular calcifications. Colonic diverticula. Aorto bi femoral bypass graft. IVC filter. Gallstones. Electronically Signed   By: Adrianna Horde M.D.   On: 08/04/2023 12:04   CT CHEST WO CONTRAST Result Date: 07/24/2023 CLINICAL DATA:  Lung nodule EXAM: CT CHEST WITHOUT CONTRAST TECHNIQUE: Multidetector CT imaging of the chest was performed following the standard protocol without IV contrast. RADIATION DOSE REDUCTION: This exam was performed according to the departmental dose-optimization program which includes automated exposure control, adjustment of the mA and/or kV according to patient size and/or use of iterative reconstruction technique. COMPARISON:  Chest x-ray 06/12/2022.  CT chest 08/06/2019. FINDINGS: Cardiovascular: No significant vascular findings. Normal heart size. No  pericardial effusion. There are atherosclerotic calcifications of the aorta and coronary arteries. Mediastinum/Nodes: No enlarged mediastinal or axillary lymph nodes. Thyroid gland, trachea, and esophagus demonstrate no significant findings. Lungs/Pleura: Again seen is central peribronchial wall thickening. There is mild paraseptal emphysema. There is a focal opacity in the inferior right upper lobe extending to the hilum which appears increased in size and is now more dense/masslike measuring 4.5 x 3.4 x 1.7 cm. Margins are slightly spiculated and there is tethering to the peripheral pleura. There are mild secretions in the trachea. No discrete pulmonary nodules, pleural effusion pneumothorax. Upper Abdomen: Gallstones are present Musculoskeletal: No acute fracture. Cervical spinal fusion plate present. IMPRESSION: 1. Focal opacity in the inferior right upper lobe extending to the hilum has increased in size and is now more dense/masslike measuring 4.5 x 3.4 x 1.7 cm. Margins are slightly spiculated and there is tethering to the peripheral pleura. Findings are worrisome for malignancy. 2. Central peribronchial wall thickening again seen compatible with bronchitis. 3.  Cholelithiasis. Aortic Atherosclerosis (ICD10-I70.0) and Emphysema (ICD10-J43.9). Electronically Signed   By: Tyron Gallon M.D.   On: 07/24/2023 15:36     Past medical hx Past Medical History:  Diagnosis Date   AC (acromioclavicular) joint bone spurs, unspecified laterality 11/25/2016   Arthritis    "back" (05/16/2015)   CAD in native artery, occluded Lcx and rotational atherectomy to LAD with DES 05/17/15 05/18/2015   Cavitary lesion of lung 02/25/2018   Chronic back pain    "all my back"   Chronic bronchitis (HCC)    COPD (chronic obstructive pulmonary disease) (HCC)    Diabetes mellitus without complication (HCC)    pt states he is no longer diabetic, no medications   GERD (gastroesophageal reflux disease)    H/O blood clots    s/p  IVC filter ~ 2010: per IR 12/17/16, would not recommended retrieval unless filterl causes new concerns before one of my neck ORs"   History of gout    Hyperlipidemia    Hypertension    Lung cancer (HCC) 02/25/2018   bronchoscopy, RUL + non-small cell carcinoma   Neuromuscular disorder (HCC)    PAD (peripheral artery disease) (HCC)    Phlebitis    Positive TB test    Seasonal allergies    Shortness of breath    Spinal disease      Social History   Tobacco Use   Smoking status: Every Day    Current packs/day: 0.00    Average packs/day: 0.5 packs/day for 49.0 years (24.5 ttl pk-yrs)    Types: Cigarettes    Start date: 02/10/1972    Last attempt to quit: 02/09/2021    Years since quitting: 2.4    Passive exposure: Current (Wife is a Smoker)   Smokeless tobacco: Never   Tobacco comments:    half a pack a day   Vaping Use   Vaping status: Never Used  Substance Use Topics   Alcohol  use: Not Currently   Drug use: No    Mr.Resch reports that he has been smoking cigarettes. He started smoking about 51 years ago. He has a 24.5 pack-year smoking history. He has been exposed to tobacco smoke. He has never used smokeless tobacco. He reports that he does not currently use alcohol . He reports that he does not use drugs.  Tobacco Cessation: Ready to quit: Not Answered Counseling given: Not Answered Tobacco comments: half a pack a day  Current Every day smoker , counseled today to quit. Currently smoking 1/2 PPD  Past surgical hx, Family hx, Social hx all reviewed.  Current Outpatient Medications on File Prior to Visit  Medication Sig   albuterol  (PROAIR  HFA) 108 (90 Base) MCG/ACT inhaler Inhale 1-2 puffs into the lungs every 6 (six) hours as needed for wheezing or shortness of breath.   albuterol  (PROVENTIL ) (2.5 MG/3ML) 0.083% nebulizer solution Take 3 mLs (2.5 mg total) by nebulization every 6 (six) hours as needed for wheezing or shortness of breath.   aspirin  EC 81 MG tablet  Take 81 mg by mouth daily.   carvedilol  (COREG ) 6.25 MG tablet Take 1 tablet (6.25 mg total) by mouth 2 (two) times daily.   Cholecalciferol (VITAMIN D) 50 MCG (2000 UT) tablet Take 2,000 Units by mouth daily.   colchicine  0.6 MG tablet TAKE 2 TABLETS BY MOUTH AS  DIRECTED THEN 1 TABLET  AFTER 1 HOUR. CAN TAKE 1  TABLET TWICE DAILY  THEREAFTER.   EPINEPHrine  (PRIMATENE  MIST) 0.125 MG/ACT AERO Inhale 1 puff into the  lungs daily as needed (COPD flare).   ezetimibe  (ZETIA ) 10 MG tablet TAKE 1 TABLET BY MOUTH DAILY   Fluticasone -Umeclidin-Vilant (TRELEGY ELLIPTA ) 200-62.5-25 MCG/ACT AEPB Inhale 1 puff into the lungs every evening.   Fluticasone -Umeclidin-Vilant (TRELEGY ELLIPTA ) 200-62.5-25 MCG/ACT AEPB Inhale 1 puff into the lungs daily at 2 PM.   HYDROcodone -acetaminophen  (NORCO) 5-325 MG tablet Take 1 tablet by mouth every 6 (six) hours as needed for moderate pain.   olmesartan  (BENICAR ) 40 MG tablet Take 1 tablet (40 mg total) by mouth daily.   omeprazole  (PRILOSEC) 40 MG capsule TAKE 1 CAPSULE BY MOUTH DAILY   pregabalin  (LYRICA ) 100 MG capsule Take 100 mg by mouth 3 (three) times daily.   rosuvastatin  (CRESTOR ) 40 MG tablet TAKE 1 TABLET BY MOUTH DAILY   traZODone  (DESYREL ) 100 MG tablet TAKE 1 TABLET BY MOUTH AT  BEDTIME (Patient taking differently: Take 100 mg by mouth at bedtime as needed for sleep.)   furosemide  (LASIX ) 20 MG tablet Take 1 tablet (20 mg total) by mouth daily.   potassium chloride  (KLOR-CON  M) 10 MEQ tablet Take 1 tablet (10 mEq total) by mouth daily. (Patient not taking: Reported on 08/05/2023)   No current facility-administered medications on file prior to visit.     Allergies  Allergen Reactions   Zestril  [Lisinopril ] Hives and Itching   Contrast Media [Iodinated Contrast Media] Hives and Itching    OK if pt has Benadryl  pre-med.   Elavil  [Amitriptyline ] Nausea Only and Other (See Comments)    Makes pt "feel weird"   Gadolinium Derivatives Hives and Itching    Motrin [Ibuprofen] Other (See Comments)    Internal bleeding    Review Of Systems:  Constitutional:   No  weight loss, night sweats,  Fevers, chills, fatigue, or  lassitude.  HEENT:   No headaches,  Difficulty swallowing,  Tooth/dental problems, or  Sore throat,                No sneezing, itching, ear ache, nasal congestion, post nasal drip,   CV:  No chest pain,  Orthopnea, PND, swelling in lower extremities, anasarca, dizziness, palpitations, syncope.   GI  No heartburn, indigestion, abdominal pain, nausea, vomiting, diarrhea, change in bowel habits, loss of appetite, bloody stools.   Resp: No shortness of breath with exertion or at rest.  No excess mucus, no productive cough,  No non-productive cough,  No coughing up of blood.  No change in color of mucus.  No wheezing.  No chest wall deformity  Skin: no rash or lesions.  GU: no dysuria, change in color of urine, no urgency or frequency.  No flank pain, no hematuria   MS:  No joint pain or swelling.  No decreased range of motion.  No back pain.  Psych:  No change in mood or affect. No depression or anxiety.  No memory loss.   Vital Signs BP 136/78 (BP Location: Left Arm, Patient Position: Sitting, Cuff Size: Large)   Pulse (!) 47   Ht 5\' 9"  (1.753 m)   Wt 236 lb (107 kg)   SpO2 97%   BMI 34.85 kg/m    Physical Exam:  General- No distress,  A&Ox3, pleasant ENT: No sinus tenderness, TM clear, pale nasal mucosa, no oral exudate,no post nasal drip, no LAN Cardiac: S1, S2, regular rate and rhythm, no murmur Chest: No wheeze/ rales/ dullness; no accessory muscle use, no nasal flaring, no sternal retractions Abd.: Soft Non-tender, ND, BS +, Body mass index is 34.85 kg/m.  Ext: No clubbing cyanosis, edema, no obvious deformities Neuro:  normal strength, MAE x 4, A&O x 3 Skin: No rashes, warm and dry, no obvious skin lesions  Psych: normal mood and behavior, anxious about PET results   Assessment/Plan Non-small cell lung  cancer Non-small cell carcinoma of the right upper lobe with possible recurrence indicated by PET scan. Biopsy required for confirmation. Discussed bronchoscopy and biopsy risks with him.  - Order bronchoscopy and biopsy to assess for recurrence. - Schedule procedure for May 27, alternate date June 2. - Instruct to hold aspirin  on May 26 and May 27. We have reviewed the results of your PET scan, which shows some low lever activity in the nodule we are watching. The options for follow up are biopsy now, vs watchful waiting with a follow up CT Chest.  You prefer to move forward with the biopsy. I have placed an order for a bronchoscopy with biopsies.  We have discussed the procedure in detail.  We have reviewed the risks and benefits of the procedure. These include bleeding, infection, puncture of the lung, and adverse reaction to anesthesia. You have agreed to proceed with biopsy to evaluate the right upper lobe nodule. Your procedure will be done by Dr. Racheal Buddle. You will receive a letter today with date time and information pertaining to the procedure. You will need someone to drive you to the procedure, stay with you during the procedure, and stay with you after the procedure. You will also need someone to stay with you for 24 hours after anesthesia to ensure you have cleared and are doing well. You will follow-up with me 1 week after the procedure to review the results and to ensure you are doing well. Continue Trelegy 1 puff daily  Rinse mouth after use  Use albuterol  as needed for break through shortness of breath or wheezing. Call if you need us  prior to the procedure or if you have any questions at all. Please contact office for sooner follow up if symptoms do not improve or worsen or seek emergency care   Please work on quitting smoking completely. You can receive free nicotine  replacement therapy ( patches, gum or mints) by calling 1-800-QUIT NOW. Please call so we can get you  on the path to becoming  a non-smoker. I know it is hard, but you can do this!  Other options for assistance in smoking cessation ( As covered by your insurance benefits)  Hypnosis for smoking cessation  [employer] [phone]  Acupuncture for smoking cessation  [employer] [phone]      Chronic obstructive pulmonary disease (COPD) COPD managed with Trelegy inhaler. Occasional albuterol  use for tightness or post-exertion. No recent exacerbations. - Continue Trelegy one puff daily, rinse mouth after use. - Use albuterol  as needed for tightness or wheezing, especially before exertion.  Diabetic neuropathy Diabetic neuropathy reported. Plan -Follow up with PCP regarding management   Follow-up Plans for post-procedure care and results review discussed. - Provide letter with procedure details, including date, time, and location. - Schedule follow-up appointment to review biopsy results post-procedure.   I spent 40 minutes dedicated to the care of this patient on the date of this encounter to include pre-visit review of records, face-to-face time with the patient discussing conditions above, post visit ordering of testing, clinical documentation with the electronic health record, making appropriate referrals as documented, and communicating necessary information to the patient's healthcare team.   Raejean Bullock, NP 08/05/2023  11:38  AM

## 2023-08-05 NOTE — Progress Notes (Signed)
 History of Present Illness Joshua [name] is a 68 y.o. male current everyday smoker referred October 2019 to Dr. Thelda Finney for COPD by Andrena Bang nurse practitioner. Past medical history significant for lung cancer diagnosed December 2019 for which patient received SBRT treatment. Patient was last seen in the office by Dr. Thelda Finney in 2021. He was lost to follow-up for serial CT scans , he re-established with us  06/2023, and repeat imaging was done.  CT Chest was concerning for Focal opacity in the inferior right upper lobe extending to the hilum has increased in size and is now more dense/masslike measuring 4.5 x 3.4 x 1.7 cm. Margins are slightly spiculated and there is tethering to the peripheral pleura. Findings are worrisome for malignancy. PET scan was ordered. Pt is here today to follow up on the results.  Pt. Has consented to use of [employer] soft wear to help capture the content of this OV   08/05/2023 OV Pt. Presents for follow up of PET scan results to better evaluate a lung nodule.   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 He presents with concerns about cancer recurrence based on repeat CT Imaging.  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 He underwent three radiation treatments and SBRT at that time, which resulted in tumor shrinkage. He is now concerned about potential tumor growth and recurrence, especially given his family history of cancer, as his mother died from stomach cancer.  We have reviewed his PET scan. The scan shows that the consolidative nodular area identified once again in the perihilar right upper lobe has low level abnormal uptake maximum SUV of 3.9. Radiology gives the option of a 6-12 month follow up scan,however with the patient's recent history of non small cell lung cancer in  2019, we will take a more aggressive approach and plan for bronchoscopy with biopsies. Both patient and his wife are in agreement with this plan.  He states from a breathing perspective he is at  his baseline.No hemoptysis. He reports significant mucus production from his sinuses and lungs.No weight loss, but has had weight gain of 7 lbs in 3 weeks. I have asked him to follow up with cardiology.   He is currently using Trelegy inhaler once daily and occasionally uses albuterol , particularly when experiencing tightness or after physical exertion. He also takes 81 mg of aspirin  daily, which he plans to hold before an upcoming procedure.  Test Results:  PET scan 08/01/2023 IMPRESSION: Consolidative nodular area identified once again in the perihilar right upper lobe has low level abnormal uptake maximum SUV of 3.9. Again there is a differential. This could be infectious or inflammatory. Low-grade malignancy is difficult to completely exclude. Follow-up options include follow-up CT imaging in 6-12 weeks versus a more aggressive approach including sampling or bronchoscopy. Please correlate with clinical presentation.   No additional areas of abnormal uptake including along lymph node chains.   Ancillary findings include extensive vascular calcifications. Colonic diverticula. Aorto bi femoral bypass graft. IVC filter.   Gallstones.   07/17/2023 CT Chest Focal opacity in the inferior right upper lobe extending to the hilum has increased in size and is now more dense/masslike measuring 4.5 x 3.4 x 1.7 cm. Margins are slightly spiculated and there is tethering to the peripheral pleura. Findings are  worrisome for malignancy. 2. Central peribronchial wall thickening again seen compatible with bronchitis. 3. Cholelithiasis.      Cytology 02/25/2018 Diagnosis 1. Lung, biopsy, Right Upper Lobe - NON-SMALL CELL CARCINOMA, SEE COMMENT. 2. Lung, biopsy, Right upper lobe - BRONCHIAL MUCOSA  WITH NO SIGNIFICANT PATHOLOGIC FINDINGS. Microscopic Comment 1. Immunohistochemistry for TTF-1 and Napsin-A is positive and PAX 8 is negative, consistent with pulmonary adenocarcinoma. Results reported to Dr. Antonio Baumgarten on 02/27/2018. Intradepartmental consultation was obtained (Dr. Secundino Dach - 1).    Latest Ref Rng & Units 11/09/2022    2:47 AM 11/08/2022    6:40 PM 11/08/2022    3:01 PM  CBC  WBC 4.0 - 10.5 K/uL 15.0  12.2    Hemoglobin 13.0 - 17.0 g/dL 16.1  09.6  04.5   Hematocrit 39.0 - 52.0 % 41.1  43.0  38.0   Platelets 150 - 400 K/uL 178  151         Latest Ref Rng & Units 02/06/2023   10:54 AM 01/17/2023    3:42 PM 11/09/2022    2:47 AM  BMP  Glucose 70 - 99 mg/dL 45  64  409   BUN 8 - 27 mg/dL 11  10  17    Creatinine 0.76 - 1.27 mg/dL 8.11  9.14  7.82   BUN/Creat Ratio 10 - 24 9  9     Sodium 134 - 144 mmol/L 145  143  135   Potassium 3.5 - 5.2 mmol/L 4.0  3.8  4.9   Chloride 96 - 106 mmol/L 100  99  105   CO2 20 - 29 mmol/L 23  30  22    Calcium  8.6 - 10.2 mg/dL 8.8  8.6  8.2     BNP    Component Value Date/Time   BNP 294.2 (H) 01/17/2023 1542   BNP 238.7 (H) 06/17/2022 0722    ProBNP No results found for: "PROBNP"  PFT    Component Value Date/Time   FEV1PRE 1.75 02/05/2018 1347   FEV1POST 2.09 02/05/2018 1347   FVCPRE 3.05 02/05/2018 1347   FVCPOST 3.30 02/05/2018 1347   TLC 7.59 02/05/2018 1347   DLCOUNC 23.38 02/05/2018 1347   PREFEV1FVCRT 58 02/05/2018 1347   PSTFEV1FVCRT 63 02/05/2018 1347    NM PET Image Initial (PI) Skull Base To Thigh Result Date: 08/04/2023 CLINICAL DATA:  Initial treatment strategy for lung nodule. EXAM: NUCLEAR MEDICINE PET SKULL BASE TO THIGH TECHNIQUE: 11.46 mCi F-18 FDG was injected intravenously. Full-ring PET imaging was performed from the skull base to thigh after the radiotracer. CT data was obtained and used for attenuation correction and anatomic localization. Fasting blood glucose: 85 mg/dl COMPARISON:  CT chest  without contrast 07/17/2023 PET-CT 02/09/2018 FINDINGS: Mediastinal blood pool activity: SUV max 2.6 Liver activity: SUV max 3.2 NECK: There is no specific abnormal uptake seen in the neck including along lymph node change of the submandibular, posterior triangle or internal jugular region. Near symmetric uptake along the visualized intracranial compartment. Incidental CT findings: The parotid glands, submandibular glands and thyroid gland are unremarkable. Visualized paranasal sinuses and mastoid air cells are clear. Diffuse vascular calcifications are seen. Streak artifact related to the patient's spinal fixation hardware. Chest: No abnormal uptake seen above blood pool in the axillary regions, hilum or mediastinum. On the prior CT scan from 07/17/2023 was a nodular infiltrative area seen measuring 4.5 x 3.4 cm in the axial plane. This area today measured in a similar fashion measures 4.6 x 3.0 cm. Uptake in  this area has maximum SUV of 3.9. No other areas of uptake along the lungs. Incidental CT findings: Heart is nonenlarged. No pericardial effusion. Significant coronary artery calcifications. The thoracic aorta is normal course and caliber with scattered vascular calcifications. No pleural effusion or pneumothorax. Apical subpleural blebs identified. Noncalcified nodule identified in the anterior aspect of the right lower lobe on image 104 measuring 6 mm. This has been present since 2019 it is not show any abnormal uptake. No additional imaging follow-up of this nodule. Abdomen and pelvis:: There is physiologic distribution radiotracer along the parenchymal organs, bowel and renal collecting systems. Incidental CT findings: Large bowel is normal course and caliber. Scattered colonic stool. Normal appendix in the right lower quadrant. Few sigmoid colon diverticula. Stomach is underdistended and has slight wall thickening, nonspecific. Please correlate with any symptoms. Small bowel is nondilated. Stones in the  non dilated gallbladder. No renal or ureteral stones. Grossly the spleen, pancreas and adrenal glands are preserved. Few prominent less than 1 cm size retroperitoneal nodes identified, not pathologic by size criteria. Surgical changes of aorto bi femoral bypass graft. IVC filter noted. SKELETON: No abnormal uptake seen along the visualized osseous structures. Incidental CT findings: Scattered degenerative changes along the spine and pelvis. Streak artifact related to the patient's dental hardware. There is soft tissue thickening along the inguinal regions which is likely postsurgical. IMPRESSION: Consolidative nodular area identified once again in the perihilar right upper lobe has low level abnormal uptake maximum SUV of 3.9. Again there is a differential. This could be infectious or inflammatory. Low-grade malignancy is difficult to completely exclude. Follow-up options include follow-up CT imaging in 6-12 weeks versus a more aggressive approach including sampling or bronchoscopy. Please correlate with clinical presentation. No additional areas of abnormal uptake including along lymph node chains. Ancillary findings include extensive vascular calcifications. Colonic diverticula. Aorto bi femoral bypass graft. IVC filter. Gallstones. Electronically Signed   By: Adrianna Horde M.D.   On: 08/04/2023 12:04   CT CHEST WO CONTRAST Result Date: 07/24/2023 CLINICAL DATA:  Lung nodule EXAM: CT CHEST WITHOUT CONTRAST TECHNIQUE: Multidetector CT imaging of the chest was performed following the standard protocol without IV contrast. RADIATION DOSE REDUCTION: This exam was performed according to the departmental dose-optimization program which includes automated exposure control, adjustment of the mA and/or kV according to patient size and/or use of iterative reconstruction technique. COMPARISON:  Chest x-ray 06/12/2022.  CT chest 08/06/2019. FINDINGS: Cardiovascular: No significant vascular findings. Normal heart size. No  pericardial effusion. There are atherosclerotic calcifications of the aorta and coronary arteries. Mediastinum/Nodes: No enlarged mediastinal or axillary lymph nodes. Thyroid gland, trachea, and esophagus demonstrate no significant findings. Lungs/Pleura: Again seen is central peribronchial wall thickening. There is mild paraseptal emphysema. There is a focal opacity in the inferior right upper lobe extending to the hilum which appears increased in size and is now more dense/masslike measuring 4.5 x 3.4 x 1.7 cm. Margins are slightly spiculated and there is tethering to the peripheral pleura. There are mild secretions in the trachea. No discrete pulmonary nodules, pleural effusion pneumothorax. Upper Abdomen: Gallstones are present Musculoskeletal: No acute fracture. Cervical spinal fusion plate present. IMPRESSION: 1. Focal opacity in the inferior right upper lobe extending to the hilum has increased in size and is now more dense/masslike measuring 4.5 x 3.4 x 1.7 cm. Margins are slightly spiculated and there is tethering to the peripheral pleura. Findings are worrisome for malignancy. 2. Central peribronchial wall thickening again seen compatible with bronchitis. 3.  Cholelithiasis. Aortic Atherosclerosis (ICD10-I70.0) and Emphysema (ICD10-J43.9). Electronically Signed   By: Tyron Gallon M.D.   On: 07/24/2023 15:36     Past medical hx Past Medical History:  Diagnosis Date   AC (acromioclavicular) joint bone spurs, unspecified laterality 11/25/2016   Arthritis    "back" (05/16/2015)   CAD in native artery, occluded Lcx and rotational atherectomy to LAD with DES 05/17/15 05/18/2015   Cavitary lesion of lung 02/25/2018   Chronic back pain    "all my back"   Chronic bronchitis (HCC)    COPD (chronic obstructive pulmonary disease) (HCC)    Diabetes mellitus without complication (HCC)    pt states he is no longer diabetic, no medications   GERD (gastroesophageal reflux disease)    H/O blood clots    s/p  IVC filter ~ 2010: per IR 12/17/16, would not recommended retrieval unless filterl causes new concerns before one of my neck ORs"   History of gout    Hyperlipidemia    Hypertension    Lung cancer (HCC) 02/25/2018   bronchoscopy, RUL + non-small cell carcinoma   Neuromuscular disorder (HCC)    PAD (peripheral artery disease) (HCC)    Phlebitis    Positive TB test    Seasonal allergies    Shortness of breath    Spinal disease      Social History   Tobacco Use   Smoking status: Every Day    Current packs/day: 0.00    Average packs/day: 0.5 packs/day for 49.0 years (24.5 ttl pk-yrs)    Types: Cigarettes    Start date: 02/10/1972    Last attempt to quit: 02/09/2021    Years since quitting: 2.4    Passive exposure: Current (Wife is a Smoker)   Smokeless tobacco: Never   Tobacco comments:    half a pack a day   Vaping Use   Vaping status: Never Used  Substance Use Topics   Alcohol  use: Not Currently   Drug use: No    Mr.Resch reports that he has been smoking cigarettes. He started smoking about 51 years ago. He has a 24.5 pack-year smoking history. He has been exposed to tobacco smoke. He has never used smokeless tobacco. He reports that he does not currently use alcohol . He reports that he does not use drugs.  Tobacco Cessation: Ready to quit: Not Answered Counseling given: Not Answered Tobacco comments: half a pack a day  Current Every day smoker , counseled today to quit. Currently smoking 1/2 PPD  Past surgical hx, Family hx, Social hx all reviewed.  Current Outpatient Medications on File Prior to Visit  Medication Sig   albuterol  (PROAIR  HFA) 108 (90 Base) MCG/ACT inhaler Inhale 1-2 puffs into the lungs every 6 (six) hours as needed for wheezing or shortness of breath.   albuterol  (PROVENTIL ) (2.5 MG/3ML) 0.083% nebulizer solution Take 3 mLs (2.5 mg total) by nebulization every 6 (six) hours as needed for wheezing or shortness of breath.   aspirin  EC 81 MG tablet  Take 81 mg by mouth daily.   carvedilol  (COREG ) 6.25 MG tablet Take 1 tablet (6.25 mg total) by mouth 2 (two) times daily.   Cholecalciferol (VITAMIN D) 50 MCG (2000 UT) tablet Take 2,000 Units by mouth daily.   colchicine  0.6 MG tablet TAKE 2 TABLETS BY MOUTH AS  DIRECTED THEN 1 TABLET  AFTER 1 HOUR. CAN TAKE 1  TABLET TWICE DAILY  THEREAFTER.   EPINEPHrine  (PRIMATENE  MIST) 0.125 MG/ACT AERO Inhale 1 puff into the  lungs daily as needed (COPD flare).   ezetimibe  (ZETIA ) 10 MG tablet TAKE 1 TABLET BY MOUTH DAILY   Fluticasone -Umeclidin-Vilant (TRELEGY ELLIPTA ) 200-62.5-25 MCG/ACT AEPB Inhale 1 puff into the lungs every evening.   Fluticasone -Umeclidin-Vilant (TRELEGY ELLIPTA ) 200-62.5-25 MCG/ACT AEPB Inhale 1 puff into the lungs daily at 2 PM.   HYDROcodone -acetaminophen  (NORCO) 5-325 MG tablet Take 1 tablet by mouth every 6 (six) hours as needed for moderate pain.   olmesartan  (BENICAR ) 40 MG tablet Take 1 tablet (40 mg total) by mouth daily.   omeprazole  (PRILOSEC) 40 MG capsule TAKE 1 CAPSULE BY MOUTH DAILY   pregabalin  (LYRICA ) 100 MG capsule Take 100 mg by mouth 3 (three) times daily.   rosuvastatin  (CRESTOR ) 40 MG tablet TAKE 1 TABLET BY MOUTH DAILY   traZODone  (DESYREL ) 100 MG tablet TAKE 1 TABLET BY MOUTH AT  BEDTIME (Patient taking differently: Take 100 mg by mouth at bedtime as needed for sleep.)   furosemide  (LASIX ) 20 MG tablet Take 1 tablet (20 mg total) by mouth daily.   potassium chloride  (KLOR-CON  M) 10 MEQ tablet Take 1 tablet (10 mEq total) by mouth daily. (Patient not taking: Reported on 08/05/2023)   No current facility-administered medications on file prior to visit.     Allergies  Allergen Reactions   Zestril  [Lisinopril ] Hives and Itching   Contrast Media [Iodinated Contrast Media] Hives and Itching    OK if pt has Benadryl  pre-med.   Elavil  [Amitriptyline ] Nausea Only and Other (See Comments)    Makes pt "feel weird"   Gadolinium Derivatives Hives and Itching    Motrin [Ibuprofen] Other (See Comments)    Internal bleeding    Review Of Systems:  Constitutional:   No  weight loss, night sweats,  Fevers, chills, fatigue, or  lassitude.  HEENT:   No headaches,  Difficulty swallowing,  Tooth/dental problems, or  Sore throat,                No sneezing, itching, ear ache, nasal congestion, post nasal drip,   CV:  No chest pain,  Orthopnea, PND, swelling in lower extremities, anasarca, dizziness, palpitations, syncope.   GI  No heartburn, indigestion, abdominal pain, nausea, vomiting, diarrhea, change in bowel habits, loss of appetite, bloody stools.   Resp: No shortness of breath with exertion or at rest.  No excess mucus, no productive cough,  No non-productive cough,  No coughing up of blood.  No change in color of mucus.  No wheezing.  No chest wall deformity  Skin: no rash or lesions.  GU: no dysuria, change in color of urine, no urgency or frequency.  No flank pain, no hematuria   MS:  No joint pain or swelling.  No decreased range of motion.  No back pain.  Psych:  No change in mood or affect. No depression or anxiety.  No memory loss.   Vital Signs BP 136/78 (BP Location: Left Arm, Patient Position: Sitting, Cuff Size: Large)   Pulse (!) 47   Ht 5\' 9"  (1.753 m)   Wt 236 lb (107 kg)   SpO2 97%   BMI 34.85 kg/m    Physical Exam:  General- No distress,  A&Ox3, pleasant ENT: No sinus tenderness, TM clear, pale nasal mucosa, no oral exudate,no post nasal drip, no LAN Cardiac: S1, S2, regular rate and rhythm, no murmur Chest: No wheeze/ rales/ dullness; no accessory muscle use, no nasal flaring, no sternal retractions Abd.: Soft Non-tender, ND, BS +, Body mass index is 34.85 kg/m.  Ext: No clubbing cyanosis, edema, no obvious deformities Neuro:  normal strength, MAE x 4, A&O x 3 Skin: No rashes, warm and dry, no obvious skin lesions  Psych: normal mood and behavior, anxious about PET results   Assessment/Plan Non-small cell lung  cancer Non-small cell carcinoma of the right upper lobe with possible recurrence indicated by PET scan. Biopsy required for confirmation. Discussed bronchoscopy and biopsy risks with him.  - Order bronchoscopy and biopsy to assess for recurrence. - Schedule procedure for May 27, alternate date June 2. - Instruct to hold aspirin  on May 26 and May 27. We have reviewed the results of your PET scan, which shows some low lever activity in the nodule we are watching. The options for follow up are biopsy now, vs watchful waiting with a follow up CT Chest.  You prefer to move forward with the biopsy. I have placed an order for a bronchoscopy with biopsies.  We have discussed the procedure in detail.  We have reviewed the risks and benefits of the procedure. These include bleeding, infection, puncture of the lung, and adverse reaction to anesthesia. You have agreed to proceed with biopsy to evaluate the right upper lobe nodule. Your procedure will be done by Dr. Racheal Buddle. You will receive a letter today with date time and information pertaining to the procedure. You will need someone to drive you to the procedure, stay with you during the procedure, and stay with you after the procedure. You will also need someone to stay with you for 24 hours after anesthesia to ensure you have cleared and are doing well. You will follow-up with me 1 week after the procedure to review the results and to ensure you are doing well. Continue Trelegy 1 puff daily  Rinse mouth after use  Use albuterol  as needed for break through shortness of breath or wheezing. Call if you need us  prior to the procedure or if you have any questions at all. Please contact office for sooner follow up if symptoms do not improve or worsen or seek emergency care   Please work on quitting smoking completely. You can receive free nicotine  replacement therapy ( patches, gum or mints) by calling 1-800-QUIT NOW. Please call so we can get you  on the path to becoming  a non-smoker. I know it is hard, but you can do this!  Other options for assistance in smoking cessation ( As covered by your insurance benefits)  Hypnosis for smoking cessation  [employer] [phone]  Acupuncture for smoking cessation  [employer] [phone]      Chronic obstructive pulmonary disease (COPD) COPD managed with Trelegy inhaler. Occasional albuterol  use for tightness or post-exertion. No recent exacerbations. - Continue Trelegy one puff daily, rinse mouth after use. - Use albuterol  as needed for tightness or wheezing, especially before exertion.  Diabetic neuropathy Diabetic neuropathy reported. Plan -Follow up with PCP regarding management   Follow-up Plans for post-procedure care and results review discussed. - Provide letter with procedure details, including date, time, and location. - Schedule follow-up appointment to review biopsy results post-procedure.   I spent 40 minutes dedicated to the care of this patient on the date of this encounter to include pre-visit review of records, face-to-face time with the patient discussing conditions above, post visit ordering of testing, clinical documentation with the electronic health record, making appropriate referrals as documented, and communicating necessary information to the patient's healthcare team.   Raejean Bullock, NP 08/05/2023  11:38  AM

## 2023-08-05 NOTE — Patient Instructions (Addendum)
 It is good to see you today. We have reviewed the results of your PET scan, which shows some low lever activity in the nodule we are watching. The options for follow up are biopsy now, vs watchful waiting with a follow up CT Chest.  You prefer to move forward with the biopsy. I have placed an order for a bronchoscopy with biopsies.  We have discussed the procedure in detail.  We have reviewed the risks and benefits of the procedure. These include bleeding, infection, puncture of the lung, and adverse reaction to anesthesia. You have agreed to proceed with biopsy to evaluate the right upper lobe nodule. Your procedure will be done by Dr. Racheal Buddle. You will receive a letter today with date time and information pertaining to the procedure. You will need someone to drive you to the procedure, stay with you during the procedure, and stay with you after the procedure. You will also need someone to stay with you for 24 hours after anesthesia to ensure you have cleared and are doing well. You will follow-up with me 1 week after the procedure to review the results and to ensure you are doing well. Continue Trelegy 1 puff daily  Rinse mouth after use  Use albuterol  as needed for break through shortness of breath or wheezing. Call if you need us  prior to the procedure or if you have any questions at all. Please contact office for sooner follow up if symptoms do not improve or worsen or seek emergency care   Please work on quitting smoking completely. You can receive free nicotine  replacement therapy ( patches, gum or mints) by calling 1-800-QUIT NOW. Please call so we can get you on the path to becoming  a non-smoker. I know it is hard, but you can do this!  Other options for assistance in smoking cessation ( As covered by your insurance benefits)  Hypnosis for smoking cessation  [employer] [phone]  Acupuncture for smoking cessation  [employer] [phone]

## 2023-08-06 ENCOUNTER — Encounter (HOSPITAL_COMMUNITY): Payer: Self-pay | Admitting: Emergency Medicine

## 2023-08-06 ENCOUNTER — Other Ambulatory Visit: Payer: Self-pay

## 2023-08-06 NOTE — Progress Notes (Signed)
 PCP - Narda Bacon, MD  Cardiologist - Hugh Madura, MD   PPM/ICD - denies Device Orders - n/a Rep Notified - n/a  Chest x-ray -  Chest CT - 07-24-23 EKG - DOS Stress Test - 05-10-15 ECHO - 09-17-22 Cardiac Cath - 05-17-15 PET scan-08-01-23 PFT-02-05-18  Sleep test- 05-16-15 CPAP - per patient does not need  GLP-1 -  DM per patient he does not have diabetes  Blood Thinner Instructions: denies Aspirin  Instructions: Hold asa Monday and Tuesday prior to procedure per instructions by Dr. Verona Goodwill  ERAS Protcol - NPO  COVID TEST- n/a  Anesthesia review: Yes Hx of HTN, COPD, DM and a IVC filter in place  Patient verbally denies any shortness of breath, fever, cough and chest pain during phone call   -------------  SDW INSTRUCTIONS given:  Your procedure is scheduled on Aug 12, 2023.  Report to [facility] Main Entrance "A" at 6:45 A.M., and check in at the Admitting office.  Call this number if you have problems the morning of surgery:  [phone]   Remember:  Do not eat or drink  after midnight the night before your surgery      Take these medicines the morning of surgery with A SIP OF WATER  albuterol   inhaler  albuterol  nebulizer solution  carvedilol  (COREG )  (PRIMATENE  MIST)   omeprazole  (PRILOSEC) rosuvastatin  (CRESTOR )   As of today, STOP taking any Aspirin  (unless otherwise instructed by your surgeon) Aleve, Naproxen, Ibuprofen, Motrin, Advil, Goody's, BC's, all herbal medications, fish oil, and all vitamins.                      Do not wear jewelry, make up, or nail polish            Do not wear lotions, powders, perfumes/colognes, or deodorant.            Do not shave 48 hours prior to surgery.  Men may shave face and neck.            Do not bring valuables to the hospital.            [facility] is not responsible for any belongings or valuables.  Do NOT Smoke (Tobacco/Vaping) 24 hours prior to your procedure If you use a CPAP at night, you may bring all  equipment for your overnight stay.   Contacts, glasses, dentures or bridgework may not be worn into surgery.      For patients admitted to the hospital, discharge time will be determined by your treatment team.   Patients discharged the day of surgery will not be allowed to drive home, and someone needs to stay with them for 24 hours.    Special instructions:   - Preparing For Surgery  Before surgery, you can play an important role. Because skin is not sterile, your skin needs to be as free of germs as possible. You can reduce the number of germs on your skin by washing with CHG (chlorahexidine gluconate) Soap before surgery.  CHG is an antiseptic cleaner which kills germs and bonds with the skin to continue killing germs even after washing.    Oral Hygiene is also important to reduce your risk of infection.  Remember - BRUSH YOUR TEETH THE MORNING OF SURGERY WITH YOUR REGULAR TOOTHPASTE  Please do not use if you have an allergy to CHG or antibacterial soaps. If your skin becomes reddened/irritated stop using the CHG.  Do not shave (including  legs and underarms) for at least 48 hours prior to first CHG shower. It is OK to shave your face.  Please follow these instructions carefully.   Shower the NIGHT BEFORE SURGERY and the MORNING OF SURGERY with DIAL Soap.   Pat yourself dry with a CLEAN TOWEL.  Wear CLEAN PAJAMAS to bed the night before surgery  Place CLEAN SHEETS on your bed the night of your first shower and DO NOT SLEEP WITH PETS.   Day of Surgery: Please shower morning of surgery  Wear Clean/Comfortable clothing the morning of surgery Do not apply any deodorants/lotions.   Remember to brush your teeth WITH YOUR REGULAR TOOTHPASTE.   Questions were answered. Patient verbalized understanding of instructions.

## 2023-08-07 ENCOUNTER — Telehealth: Payer: Self-pay

## 2023-08-07 ENCOUNTER — Ambulatory Visit (HOSPITAL_COMMUNITY)
Admission: RE | Admit: 2023-08-07 | Discharge: 2023-08-07 | Disposition: A | Discharge status: Home / Self Care | Source: Ambulatory Visit | Attending: Vascular Surgery | Admitting: Vascular Surgery

## 2023-08-07 ENCOUNTER — Other Ambulatory Visit: Payer: Self-pay

## 2023-08-07 ENCOUNTER — Other Ambulatory Visit (HOSPITAL_COMMUNITY)

## 2023-08-07 ENCOUNTER — Telehealth: Payer: Self-pay | Admitting: Cardiovascular Disease

## 2023-08-07 DIAGNOSIS — I70213 Atherosclerosis of native arteries of extremities with intermittent claudication, bilateral legs: Secondary | ICD-10-CM | POA: Diagnosis not present

## 2023-08-07 DIAGNOSIS — I739 Peripheral vascular disease, unspecified: Secondary | ICD-10-CM | POA: Diagnosis present

## 2023-08-07 DIAGNOSIS — I2510 Atherosclerotic heart disease of native coronary artery without angina pectoris: Secondary | ICD-10-CM

## 2023-08-07 DIAGNOSIS — I10 Essential (primary) hypertension: Secondary | ICD-10-CM

## 2023-08-07 NOTE — Telephone Encounter (Signed)
 Pt c/o swelling: STAT is pt has developed SOB within 24 hours  How much weight have you gained and in what time span? "Gained 7lbs" but pt was asleep and was unsure of the time span for the weight gain  If swelling, where is the swelling located? Feet, ankles, and legs  Are you currently taking a fluid pill? Yes, furosemide  (LASIX ) 20 MG tablet (Expired)   Are you currently SOB? N/A, pt was sleeping Do you have a log of your daily weights (if so, list)?   Have you gained 3 pounds in a day or 5 pounds in a week? Unsure  Have you traveled recently? No

## 2023-08-07 NOTE — Telephone Encounter (Addendum)
 Called patient's wife, DPR, back about message. Patient has BLE edema and has gained about 7 lbs in one week. Patient is taking furosemide  20 mg by mouth daily. Patient just had LE venous duplex today, not resulted, but does state no DVT. With patient's history of PAD will send message to Dr. Alvenia Aus to advise.

## 2023-08-07 NOTE — Progress Notes (Addendum)
 Anesthesia Chart Review: SAME DAY WORK-UP  Case: [id number] Date/Time: 08/12/23 0915   Procedure: BRONCHOSCOPY, WITH BIOPSY USING ELECTROMAGNETIC NAVIGATION (Right) - WITH FLOURO   Anesthesia type: General   Diagnosis: Pulmonary nodule, right [R91.1]   Pre-op diagnosis: pulmonary nodule right lung   [facility]   Surgeons: Denson Flake, MD       DISCUSSION: Patient is a 60 male scheduled for the above procedure. Smoker with RUL non-small cell lung cancer 02/2018, s/p radiation. He had routine follow-up until 2022, but notes suggest he missed several appointments but presented again to [facility] yon 06/27/23 to re-establish care and surveillance imaging.  07/17/23 CT concerning for potential tumor growth and recurrence. PET scan showed a consolidative nodular area identified once again in the perihilar RUL and now with low level abnormal uptake, consider 6-12 month follow-up scan versus bronchoscopy with biopsies. He opted for the later.   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), lung cancer (02/25/18 bronchoscopy, RUL + non-small cell carcinoma, he declined lobectomy, s/p radiation 04/14/18-04/21/18), CAD (occluded LCX, atherectomy/tandem DES mLAD 05/17/15), HTN, HLD, PAD (s/p AFBG 2010, [facility]U; atherectomy/drug-coated angioplasty right SFA 01/07/18; right femoral endarterectomy, right iliofemoral thrombectomy 12/28/20; failed right SFA angioplasty 04/03/21, s/p right Fem-above knee Popliteal Bypass with GSV graft 05/10/21; thrombectomy left SFA and left limb AFBG, left femoral endarterectomy 11/08/22), DVT (s/p IVC filter ~ 2010; seen by IR 12/17/16 for consideration of IVC filter retrieval, "We agreed to consider this filter permanent at this point and will not proceed with retrieving the filter unless there is a new concern in the future."), pre-diabetes, GERD, spinal surgery (C7-T1 ACDF 10/06/12). No current alcohol   use documented.     He is followed by cardiologists Dr. Renna Cary (CAD) and  Dr. Alvenia Aus (PAD). Last visit with Dr. Alvenia Aus 02/18/23 and with Marlana Silvan, NP on 01/17/23. No chest pain. + DOE with recent reassuring echo in 09/2022 showing  EF 60 to 65%, normal LV function, no RWMA, severe concentric LVH, G1 DD, normal RV, no significant valvular abnormalities. Coreg  decreased due to bradycardia and olmesartan  added. Lasix  also added for  some LE edema and BNP 294 . Smoking cessation recommended. One month follow-up recommended to re-evaluate for any additional recommendations. Seen by Dr. Alvenia Aus on 02/18/23. He reported significant improvement in edema and had cut down smoking to 1/2 PPD. BP controlled. Continue medical therapy advised.    At 08/05/23 pulmonology visit with Satira Curet, NP, respiratory status felt to be at baseline. No chest pain. He did reported 7 lb weight gain in 3 weeks which she advised further discussion with cardiology. He and wife Marily Shows contacted Dr. Mick Alamin office as well as LLE edema. LE venous US  ordered and done on 08/07/23 showing no LLE DVT.   Per Dr. Baldwin Levee, last ASA 08/10/23.   I called patient on 08/07/23. He was sleeping, so I spoke with his wife Marily Shows (DPR on file) to inquire if they had reached out to cardiology yet about weight gain/edema.  It sounds like he periodically weighs himself at home. The 7 lb weight gain is by his report based on a home weight a few weeks ago and office weight from 08/05/23 pulmonology visit, so possible some discrepancy  LE edema had been controlled with Lasix  20 mg daily, but over the last couple of weeks felt edema had worsened. Hhe had reported some increase in exertional dyspnea ~ six months ago when he saw  cardiology, but she was unsure if any progression since then. I did advise with upcoming surgery, would advised they contact cardiology to discuss and get additional recommendations. I also encouraged him to weight moe consistently at home to better  gauge weight changes. They will contact Dr. Levin Reamer office. Will leave chart for follow-up.  ADDENDUM 08/08/23 12:42 PM: Discussed with anesthesiologist Jonne Netters, MD. I then spoke with Mr. Botero. He has not heard back from Dr. Levin Reamer office this week. He ate watermelon last night and was up voiding a lot overnight.  He says he feels less "bloated" now. He does not weigh everyday but said his home weight 2-3 weeks ago was 230 lb and then was up to 237 lb at [facility] on 08/05/23. Since the scales may have some variability I asked him to weigh at home again today and weight was at 231 lb which is only a pound above his weight a few weeks ago.  He denied chest pain or dizziness. No SOB at rest. He is not able to walk and carry heavier objects and does have DOE after about 50 feet. He says that overall breathing has been stable for the past six months, but maybe slightly worse over the past month--some days are worse than others as is his LE edema.  He says in general RLE edema is worse than LLE, as this is the let that he has had recurrent cellulitis and chronic discoloration. He says his legs do not appear to have any infection or cellulitis. He has been compliant with Lasix  20 mg daily but does not think he has been as responsive. Primarily with pedal edema currently, right > left. (Full LLE Venous US  was done because that was the extremity wiith a "knot.", which was negative for DVT.). He does have an IVC filter due to previous DVT.  He sleeps chronically with HOB elevated, but no new changes. He is not able to lie flat due to increased mucous. He denied hemoptysis.   Weight is closer to baseline now, although he still thinks legs are more swollen overall but feels there is improvement today.  We discussed that if there is concern for volume overload or acute symptoms then there is a potential for case cancellation., I asked him to weigh daily and  continue Lasix  as prescribed. He will  await for response from Dr. Alvenia Aus, but if in the interim he develops acute worsening SOB or edema then advised to contact his office again or go to ED.     VS: Ht 5\' 9"  (1.753 m)   Wt 107 kg   BMI 34.84 kg/m  BP Readings from Last 3 Encounters:  08/05/23 136/78  06/27/23 (!) 143/59  02/18/23 120/62   Pulse Readings from Last 3 Encounters:  08/05/23 (!) 47  06/27/23 (!) 53  02/18/23 (!) 53     PROVIDERS: Narda Bacon, MD  is PCP [facility]) - Dorothye Gathers, MD is primary cardiologist - Antionette Kirks, MD is PV cardiologist  - Caye Cocks, MD is vascular surgeon - Johna Myers, MD is RAD-ONC. S/p radiation for RUL lung cancer in 2020. Per 04/29/22 APP notation, patient no showed for surveillance follow-up or CT imaging on multiple occasions. Stable CXR findings on 06/12/22.    LABS: Most recent lab results in [facility] include: Lab Results  Component Value Date   WBC 15.0 (H) 11/09/2022   HGB 13.7 11/09/2022   HCT 41.1 11/09/2022   PLT 178 11/09/2022  GLUCOSE 45 (L) 02/06/2023   CHOL 100 11/09/2022   TRIG 87 11/09/2022   HDL 35 (L) 11/09/2022   LDLCALC 48 11/09/2022   ALT 11 10/31/2022   AST 15 10/31/2022   NA 145 (H) 02/06/2023   K 4.0 02/06/2023   CL 100 02/06/2023   CREATININE 1.17 02/06/2023   BUN 11 02/06/2023   CO2 23 02/06/2023   TSH 1.901 06/12/2022   PSA 4.47 (H) 10/11/2019   INR 0.9 10/31/2022   HGBA1C 6.2 (H) 10/31/2022     PFTs 02/05/18: PRE: FVC 3.05 (66%), FEV1 1.75 (50%). POST: FVC 3.30 (72%), FEV1 2.09 (60%). DLCO unc 23.38 (75%).     IMAGES: PET Scan 08/01/23: IMPRESSION: - Consolidative nodular area identified once again in the perihilar right upper lobe has low level abnormal uptake maximum SUV of 3.9. Again there is a differential. This could be infectious or inflammatory. Low-grade malignancy is difficult to completely exclude. Follow-up options include follow-up CT imaging in 6-12 weeks versus a more aggressive approach  including sampling or bronchoscopy. Please correlate with clinical presentation. - No additional areas of abnormal uptake including along lymph node chains. - Ancillary findings include extensive vascular calcifications. Colonic diverticula. Aorto bi femoral bypass graft. IVC filter. - Gallstones.  CT Chest 07/17/23: MPRESSION: 1. Focal opacity in the inferior right upper lobe extending to the hilum has increased in size and is now more dense/masslike measuring 4.5 x 3.4 x 1.7 cm. Margins are slightly spiculated and there is tethering to the peripheral pleura. Findings are worrisome for malignancy. 2. Central peribronchial wall thickening again seen compatible with bronchitis. 3. Cholelithiasis.  - Aortic Atherosclerosis (ICD10-I70.0) and Emphysema (ICD10-J43.9).   EKG: 01/17/23: Sinus bradycardia at 48 bpm Possible Left atrial enlargement Artifact Prolonged QT (QT/QTcB B 554/494 ms) Confirmed by Marlana Silvan ([id number]) on 01/17/2023 3:03:23 PM   CV: LLE Venous US  08/07/23: Summary:  RIGHT:  - No evidence of common femoral vein obstruction.  LEFT:  - There is no evidence of deep vein thrombosis in the lower extremity.  - No cystic structure found in the popliteal fossa.    Echo 09/17/22: IMPRESSIONS   1. Left ventricular ejection fraction, by estimation, is 60 to 65%. Left  ventricular ejection fraction by 3D volume is 61 %. The left ventricle has  normal function. The left ventricle has no regional wall motion  abnormalities. There is severe concentric   left ventricular hypertrophy. Left ventricular diastolic parameters are  consistent with Grade I diastolic dysfunction (impaired relaxation). The  average left ventricular global longitudinal strain is -21.2 %. The global  longitudinal strain is normal.   2. Right ventricular systolic function is normal. The right ventricular  size is normal. Tricuspid regurgitation signal is inadequate for assessing  PA pressure.   3. The  mitral valve is normal in structure. No evidence of mitral valve  regurgitation. No evidence of mitral stenosis.   4. The aortic valve is tricuspid. Aortic valve regurgitation is not  visualized.   5. The inferior vena cava is normal in size with greater than 50%  respiratory variability, suggesting right atrial pressure of 3 mmHg.  - Comparison(s): No significant change from prior study.      Carotid U/S 11/24/17: Final Interpretation: - Right Carotid: Velocities in the right ICA are consistent with a 1-39% stenosis. - Left Carotid: Velocities in the left ICA are consistent with a 1-39% stenosis. Non-hemodynamically significant plaque noted in the CCA. - Vertebrals:  Bilateral vertebral arteries demonstrate antegrade flow. -  Subclavians: Normal flow hemodynamics were seen in bilateral subclavian arteries.     PCI 05/17/15: Mid LAD lesion, 80% stenosed. Post intervention, there is a 0% residual stenosis. Prox LAD to Mid LAD lesion, 80% stenosed. Post intervention, there is a 0% residual stenosis. The lesion was previously treated with angioplasty. Successful rotational atherectomy and stenting of tandem lesions in the mid LAD with DES. Plan DAPT for one year.      Cardiac cath 05/16/15: Prox LAD to Mid LAD lesion, 90% stenosed. Post intervention, there remains 80% residual calcific stenosis. Mid LAD lesion, 80% stenosed. Dist LAD lesion, 55% stenosed. Prox Cx lesion, 100% stenosed. Prox Cx to Mid Cx lesion, 99% stenosed. Ost 2nd Mrg to 2nd Mrg lesion, 99% stenosed. -- Seen via retrograde filling from LAD/diagonal-OM collaterals There is hyperdynamic left ventricular systolic function. Elevated LVEDP Severe 2 site lesion in the LAD. Very rigid lesions that would not yield to Cutting Balloon or noncompliant balloon angioplasty. These lesions are likely best treated with rotational atherectomy after review with Dr. Peter Swaziland. Plan: Monitor overnight following PTCA. We'll plan for return to  the Cath Lab tomorrow for rotational atherectomy of the LAD with stenting of the 2 lesions.      Past Medical History:  Diagnosis Date   AC (acromioclavicular) joint bone spurs, unspecified laterality 11/25/2016   Arthritis    "back" (05/16/2015)   CAD in native artery, occluded Lcx and rotational atherectomy to LAD with DES 05/17/15 05/18/2015   Cavitary lesion of lung 02/25/2018   Chronic back pain    "all my back"   Chronic bronchitis (HCC)    COPD (chronic obstructive pulmonary disease) (HCC)    Diabetes mellitus without complication (HCC)    pt states he is no longer diabetic, no medications   GERD (gastroesophageal reflux disease)    H/O blood clots    s/p IVC filter ~ 2010: per IR 12/17/16, would not recommended retrieval unless filterl causes new concerns before one of my neck ORs"   History of gout    Hyperlipidemia    Hypertension    Lung cancer (HCC) 02/25/2018   bronchoscopy, RUL + non-small cell carcinoma   Neuromuscular disorder (HCC)    PAD (peripheral artery disease) (HCC)    Phlebitis    Positive TB test    Seasonal allergies    Shortness of breath    Spinal disease     Past Surgical History:  Procedure Laterality Date   ABDOMINAL AORTOGRAM W/LOWER EXTREMITY  01/07/2018   ABDOMINAL AORTOGRAM W/LOWER EXTREMITY N/A 01/07/2018   Procedure: ABDOMINAL AORTOGRAM W/LOWER EXTREMITY;  Surgeon: Wenona Hamilton, MD;  [facility];  Service: Cardiovascular;  Laterality: N/A;   ABDOMINAL AORTOGRAM W/LOWER EXTREMITY N/A 12/27/2020   Procedure: ABDOMINAL AORTOGRAM W/LOWER EXTREMITY;  Surgeon: Wenona Hamilton, MD;  [facility];  Service: Cardiovascular;  Laterality: N/A;   ABDOMINAL AORTOGRAM W/LOWER EXTREMITY N/A 04/03/2021   Procedure: ABDOMINAL AORTOGRAM W/LOWER EXTREMITY;  Surgeon: Margherita Shell, MD;  [facility];  Service: Cardiovascular;  Laterality: N/A;   ABDOMINAL AORTOGRAM W/LOWER EXTREMITY N/A 10/01/2022    Procedure: ABDOMINAL AORTOGRAM W/LOWER EXTREMITY;  Surgeon: Margherita Shell, MD;  [facility];  Service: Cardiovascular;  Laterality: N/A;   ANTERIOR CERVICAL DECOMP/DISCECTOMY FUSION  ~ 2001-2009 X 3   ANTERIOR CERVICAL DECOMP/DISCECTOMY FUSION N/A 10/06/2012   Procedure: ANTERIOR CERVICAL DECOMPRESSION/DISCECTOMY FUSION 1 LEVEL;  Surgeon: Augustine Blocker, MD;  [facility];  Service: Neurosurgery;  Laterality: N/A;  C7T1 anterior cervical decompression with fusion plating and bonegraft    APPLICATION OF WOUND VAC Left 11/08/2022   Procedure: APPLICATION OF WOUND VAC;  Surgeon: Margherita Shell, MD;  [facility];  Service: Vascular;  Laterality: Left;   BACK SURGERY     BIOPSY  11/21/2020   Procedure: BIOPSY;  Surgeon: Sergio Dandy, MD;  Location: [facility];  Service: Endoscopy;;   CARDIAC CATHETERIZATION  05/16/2015   CARDIAC CATHETERIZATION N/A 05/16/2015   Procedure: Left Heart Cath and Coronary Angiography;  Surgeon: Arleen Lacer, MD;  Location: [facility] INVASIVE CV LAB;  Service: Cardiovascular;  Laterality: N/A;   CARDIAC CATHETERIZATION N/A 05/16/2015   Procedure: Coronary Balloon Angioplasty;  Surgeon: Arleen Lacer, MD;  Location: [facility] INVASIVE CV LAB;  Service: Cardiovascular;  Laterality: N/A;   CARDIAC CATHETERIZATION N/A 05/17/2015   Procedure: Coronary Stent Intervention Rotoblater;  Surgeon: Peter M Swaziland, MD;  Location: [facility] INVASIVE CV LAB;  Service: Cardiovascular;  Laterality: N/A;   COLONOSCOPY WITH PROPOFOL  N/A 11/21/2020   Procedure: COLONOSCOPY WITH PROPOFOL ;  Surgeon: Sergio Dandy, MD;  Location: [facility];  Service: Endoscopy;  Laterality: N/A;   ELBOW SURGERY Left 2016   "had bone pieces removed"   ENDARTERECTOMY Left 11/08/2022   Procedure: LEFT ILIOFEMORAL ENDARTERECTOMY;  Surgeon: Margherita Shell, MD;  Location[facility] OR;  Service: Vascular;  Laterality: Left;   ENDARTERECTOMY FEMORAL Right 12/28/2020   Procedure: RIGHT FEMORAL  ARTERY ENDARTERECTOMY;  Surgeon: Margherita Shell, MD;  Location: [facility] OR;  Service: Vascular;  Laterality: Right;   ESOPHAGEAL MANOMETRY N/A 12/30/2016   Procedure: ESOPHAGEAL MANOMETRY (EM);  Surgeon: Tobin Forts, MD;  Location: [facility];  Service: Endoscopy;  Laterality: N/A;   ESOPHAGOGASTRODUODENOSCOPY (EGD) WITH PROPOFOL  N/A 11/21/2020   Procedure: ESOPHAGOGASTRODUODENOSCOPY (EGD) WITH PROPOFOL ;  Surgeon: Sergio Dandy, MD;  Location: [facility];  Service: Endoscopy;  Laterality: N/A;   EXCISIONAL HEMORRHOIDECTOMY     FEMORAL BYPASS  ~ 2010   FEMORAL-POPLITEAL BYPASS GRAFT Right 04/04/2021   Procedure: RIGHT FEMORAL-POPLITEAL ARTERY BYPASS GRAFT;  Surgeon: Margherita Shell, MD;  [facility];  Service: Vascular;  Laterality: Right;   IR RADIOLOGIST EVAL & MGMT  12/17/2016   IVC FILTER PLACEMENT ([facility] HX)  2010   "had blood clots in my back"   NECK SURGERY  2014   2008-2014   [facility] ANGIOPLASTY Right 12/28/2020   Procedure: PATCH ANGIOPLASTY USING;  Surgeon: Margherita Shell, MD;  [facility];  Service: Vascular;  Laterality: Right;   PATCH ANGIOPLASTY Left 11/08/2022   Procedure: LEFT FEMORAL ARTERY PATCH ANGIOPLASTY USING HEMASHEILD PLATINUM DACRON PATCH 0.8CM X 15.2CM;  Surgeon: Margherita Shell, MD;  Location: [facility] OR;  Service: Vascular;  Laterality: Left;   PERIPHERAL VASCULAR ATHERECTOMY  01/07/2018   Procedure: PERIPHERAL VASCULAR ATHERECTOMY;  Surgeon: Wenona Hamilton, MD;  [facility];  Service: Cardiovascular;;   PERIPHERAL VASCULAR BALLOON ANGIOPLASTY  01/07/2018   Procedure: PERIPHERAL VASCULAR BALLOON ANGIOPLASTY;  Surgeon: Wenona Hamilton, MD;  [facility];  Service: Cardiovascular;;   PERIPHERAL VASCULAR CATHETERIZATION N/A 02/14/2016   Procedure: Abdominal Aortogram w/Lower Extremity;  Surgeon: Wenona Hamilton, MD;  [facility];  Service: Cardiovascular;  Laterality: N/A;   PERIPHERAL VASCULAR  INTERVENTION Right 04/03/2021   Procedure: PERIPHERAL VASCULAR INTERVENTION;  Surgeon: Margherita Shell, MD;  [facility];  Service: Cardiovascular;  Laterality:  Right;   POLYPECTOMY  11/21/2020   Procedure: POLYPECTOMY;  Surgeon: Sergio Dandy, MD;  [facility];  Service: Endoscopy;;   POSTERIOR LAMINECTOMY / DECOMPRESSION LUMBAR SPINE  2006   "bone spurs"   THROMBECTOMY FEMORAL ARTERY Left 11/08/2022   Procedure: THROMBECTOMY SUPERFICIAL FEMORAL ARTERY;  Surgeon: Margherita Shell, MD;  [facility];  Service: Vascular;  Laterality: Left;   THROMBECTOMY ILIAC ARTERY N/A 11/08/2022   Procedure: THROMBECTOMY OF AORTA BIFEMORAL GRAFT;  Surgeon: Margherita Shell, MD;  [facility];  Service: Vascular;  Laterality: N/A;   VIDEO BRONCHOSCOPY WITH ENDOBRONCHIAL NAVIGATION Right 02/25/2018   Procedure: VIDEO BRONCHOSCOPY WITH ENDOBRONCHIAL NAVIGATION;  Surgeon: Prudy Brownie, DO;  [facility];  Service: Thoracic;  Laterality: Right;   VIDEO BRONCHOSCOPY WITH ENDOBRONCHIAL ULTRASOUND Right 02/25/2018   Procedure: VIDEO BRONCHOSCOPY WITH ENDOBRONCHIAL ULTRASOUND;  Surgeon: Prudy Brownie, DO;  [facility];  Service: Thoracic;  Laterality: Right;    MEDICATIONS: No current facility-administered medications for this encounter.    albuterol  (PROAIR  HFA) 108 (90 Base) MCG/ACT inhaler   albuterol  (PROVENTIL ) (2.5 MG/3ML) 0.083% nebulizer solution   aspirin  EC 81 MG tablet   carvedilol  (COREG ) 6.25 MG tablet   Cholecalciferol (VITAMIN D) 50 MCG (2000 UT) tablet   colchicine  0.6 MG tablet   EPINEPHrine  (PRIMATENE  MIST) 0.125 MG/ACT AERO   ezetimibe  (ZETIA ) 10 MG tablet   Fluticasone -Umeclidin-Vilant (TRELEGY ELLIPTA ) 200-62.5-25 MCG/ACT AEPB   Fluticasone -Umeclidin-Vilant (TRELEGY ELLIPTA ) 200-62.5-25 MCG/ACT AEPB   furosemide  (LASIX ) 20 MG tablet   HYDROcodone -acetaminophen  (NORCO) 5-325 MG tablet   olmesartan  (BENICAR ) 40 MG tablet   omeprazole   (PRILOSEC) 40 MG capsule   potassium chloride  (KLOR-CON  M) 10 MEQ tablet   pregabalin  (LYRICA ) 100 MG capsule   rosuvastatin  (CRESTOR ) 40 MG tablet   traZODone  (DESYREL ) 100 MG tablet    Ella Gun, PA-C Surgical Short Stay/Anesthesiology [facility] Phone [phone] [facility] Phone [phone] 08/07/2023 5:47 PM

## 2023-08-07 NOTE — Telephone Encounter (Signed)
 Pt and wife called with c/o LLE "knot running from ankle to knee cap" that has been ongoing for a few days. He denies any warmth/redness or any injury to this leg. He also noticed increased R foot swelling, though this has been on ongoing issue for him. He stated his cancer has come back recently and overall he has gained 7 lbs in the last few weeks. I have advised him to inform his cardiologist of this. He has been scheduled today for a DVT study and to f/u with APP next week. He is aware of these appts.

## 2023-08-08 NOTE — Anesthesia Preprocedure Evaluation (Signed)
Anesthesia Evaluation  Patient identified by MRN, date of birth, ID band Patient awake    Reviewed: Allergy & Precautions, NPO status , Patient's Chart, lab work & pertinent test results  Airway Mallampati: II  TM Distance: >3 FB Neck ROM: Full    Dental no notable dental hx.    Pulmonary asthma , sleep apnea and Continuous Positive Airway Pressure Ventilation , COPD,  COPD inhaler, Current Smoker   Pulmonary exam normal        Cardiovascular hypertension, Pt. on medications  Rhythm:Regular Rate:Normal     Neuro/Psych   Anxiety Depression     Neuromuscular disease    GI/Hepatic Neg liver ROS,GERD  Medicated,,  Endo/Other  diabetes, Type 2, Oral Hypoglycemic AgentsHypothyroidism    Renal/GU negative Renal ROS  negative genitourinary   Musculoskeletal  (+) Arthritis , Osteoarthritis,    Abdominal Normal abdominal exam  (+)   Peds  Hematology negative hematology ROS (+)   Anesthesia Other Findings   Reproductive/Obstetrics                             Anesthesia Physical Anesthesia Plan  ASA: 3  Anesthesia Plan: MAC   Post-op Pain Management:    Induction: Intravenous  PONV Risk Score and Plan: 0 and Ondansetron, Dexamethasone, Midazolam, Treatment may vary due to age or medical condition and Propofol infusion  Airway Management Planned: Simple Face Mask, Natural Airway and Nasal Cannula  Additional Equipment: None  Intra-op Plan:   Post-operative Plan:   Informed Consent: I have reviewed the patients History and Physical, chart, labs and discussed the procedure including the risks, benefits and alternatives for the proposed anesthesia with the patient or authorized representative who has indicated his/her understanding and acceptance.     Dental advisory given  Plan Discussed with: CRNA  Anesthesia Plan Comments: (PAT note written by Myra Gianotti, PA-C.)        Anesthesia Quick Evaluation

## 2023-08-08 NOTE — Telephone Encounter (Signed)
 Increase furosemide  to 40 mg once daily.  Check basic metabolic profile in 1 week and schedule a follow-up with APP in 1 to 2 weeks.

## 2023-08-08 NOTE — Telephone Encounter (Signed)
 Left a message for the patient to call back.

## 2023-08-12 ENCOUNTER — Ambulatory Visit (HOSPITAL_BASED_OUTPATIENT_CLINIC_OR_DEPARTMENT_OTHER): Admitting: Physician Assistant

## 2023-08-12 ENCOUNTER — Ambulatory Visit (HOSPITAL_COMMUNITY)
Admission: RE | Admit: 2023-08-12 | Discharge: 2023-08-12 | Disposition: A | Discharge status: Home / Self Care | Attending: Emergency Medicine | Admitting: Emergency Medicine

## 2023-08-12 ENCOUNTER — Other Ambulatory Visit: Payer: Self-pay

## 2023-08-12 ENCOUNTER — Encounter (HOSPITAL_COMMUNITY): Payer: Self-pay | Admitting: Emergency Medicine

## 2023-08-12 ENCOUNTER — Encounter (HOSPITAL_COMMUNITY)
Admission: RE | Disposition: A | Discharge status: Home / Self Care | Payer: Self-pay | Source: Home / Self Care | Attending: Emergency Medicine

## 2023-08-12 ENCOUNTER — Ambulatory Visit (HOSPITAL_COMMUNITY): Admitting: Physician Assistant

## 2023-08-12 ENCOUNTER — Ambulatory Visit (HOSPITAL_COMMUNITY)

## 2023-08-12 DIAGNOSIS — R918 Other nonspecific abnormal finding of lung field: Secondary | ICD-10-CM | POA: Diagnosis not present

## 2023-08-12 DIAGNOSIS — R911 Solitary pulmonary nodule: Secondary | ICD-10-CM | POA: Diagnosis present

## 2023-08-12 DIAGNOSIS — F17210 Nicotine dependence, cigarettes, uncomplicated: Secondary | ICD-10-CM | POA: Diagnosis not present

## 2023-08-12 DIAGNOSIS — Z85118 Personal history of other malignant neoplasm of bronchus and lung: Secondary | ICD-10-CM | POA: Diagnosis not present

## 2023-08-12 DIAGNOSIS — K219 Gastro-esophageal reflux disease without esophagitis: Secondary | ICD-10-CM | POA: Insufficient documentation

## 2023-08-12 DIAGNOSIS — Z7982 Long term (current) use of aspirin: Secondary | ICD-10-CM | POA: Diagnosis not present

## 2023-08-12 DIAGNOSIS — C3411 Malignant neoplasm of upper lobe, right bronchus or lung: Secondary | ICD-10-CM | POA: Diagnosis not present

## 2023-08-12 DIAGNOSIS — Z79899 Other long term (current) drug therapy: Secondary | ICD-10-CM | POA: Insufficient documentation

## 2023-08-12 DIAGNOSIS — Z8711 Personal history of peptic ulcer disease: Secondary | ICD-10-CM | POA: Insufficient documentation

## 2023-08-12 DIAGNOSIS — I10 Essential (primary) hypertension: Secondary | ICD-10-CM | POA: Diagnosis not present

## 2023-08-12 DIAGNOSIS — I2510 Atherosclerotic heart disease of native coronary artery without angina pectoris: Secondary | ICD-10-CM | POA: Insufficient documentation

## 2023-08-12 DIAGNOSIS — E1151 Type 2 diabetes mellitus with diabetic peripheral angiopathy without gangrene: Secondary | ICD-10-CM | POA: Insufficient documentation

## 2023-08-12 DIAGNOSIS — Z955 Presence of coronary angioplasty implant and graft: Secondary | ICD-10-CM | POA: Insufficient documentation

## 2023-08-12 DIAGNOSIS — M1990 Unspecified osteoarthritis, unspecified site: Secondary | ICD-10-CM | POA: Insufficient documentation

## 2023-08-12 DIAGNOSIS — J449 Chronic obstructive pulmonary disease, unspecified: Secondary | ICD-10-CM | POA: Insufficient documentation

## 2023-08-12 DIAGNOSIS — E1140 Type 2 diabetes mellitus with diabetic neuropathy, unspecified: Secondary | ICD-10-CM | POA: Insufficient documentation

## 2023-08-12 DIAGNOSIS — I119 Hypertensive heart disease without heart failure: Secondary | ICD-10-CM | POA: Diagnosis not present

## 2023-08-12 DIAGNOSIS — G709 Myoneural disorder, unspecified: Secondary | ICD-10-CM | POA: Insufficient documentation

## 2023-08-12 HISTORY — PX: BRONCHIAL BIOPSY: SHX5109

## 2023-08-12 HISTORY — PX: BRONCHIAL NEEDLE ASPIRATION BIOPSY: SHX5106

## 2023-08-12 HISTORY — PX: BRONCHIAL BRUSHINGS: SHX5108

## 2023-08-12 HISTORY — PX: BRONCHOSCOPY, WITH BIOPSY USING ELECTROMAGNETIC NAVIGATION: SHX7536

## 2023-08-12 LAB — CBC
HCT: 46.4 % (ref 39.0–52.0)
Hemoglobin: 14.6 g/dL (ref 13.0–17.0)
MCH: 29.9 pg (ref 26.0–34.0)
MCHC: 31.5 g/dL (ref 30.0–36.0)
MCV: 94.9 fL (ref 80.0–100.0)
Platelets: 144 10*3/uL — ABNORMAL LOW (ref 150–400)
RBC: 4.89 MIL/uL (ref 4.22–5.81)
RDW: 16.2 % — ABNORMAL HIGH (ref 11.5–15.5)
WBC: 9.4 10*3/uL (ref 4.0–10.5)
nRBC: 0 % (ref 0.0–0.2)

## 2023-08-12 LAB — GLUCOSE, CAPILLARY: Glucose-Capillary: 110 mg/dL — ABNORMAL HIGH (ref 70–99)

## 2023-08-12 LAB — BASIC METABOLIC PANEL WITH GFR
Anion gap: 10 (ref 5–15)
BUN: 20 mg/dL (ref 8–23)
CO2: 22 mmol/L (ref 22–32)
Calcium: 8.7 mg/dL — ABNORMAL LOW (ref 8.9–10.3)
Chloride: 106 mmol/L (ref 98–111)
Creatinine, Ser: 1.65 mg/dL — ABNORMAL HIGH (ref 0.61–1.24)
GFR, Estimated: 45 mL/min — ABNORMAL LOW (ref >60)
Glucose, Bld: 91 mg/dL (ref 70–99)
Potassium: 5.1 mmol/L (ref 3.5–5.1)
Sodium: 138 mmol/L (ref 135–145)

## 2023-08-12 SURGERY — BRONCHOSCOPY, WITH BIOPSY USING ELECTROMAGNETIC NAVIGATION
Anesthesia: General | Laterality: Right

## 2023-08-12 MED ORDER — DROPERIDOL 2.5 MG/ML IJ SOLN: 0.6250 mg | Freq: Once | INTRAMUSCULAR | Status: DC | PRN

## 2023-08-12 MED ORDER — CHLORHEXIDINE GLUCONATE 0.12 % MT SOLN
OROMUCOSAL | Status: AC
  Administered 2023-08-12: 15 mL via OROMUCOSAL
  Filled 2023-08-12: qty 15

## 2023-08-12 MED ORDER — EPHEDRINE SULFATE-NACL 50-0.9 MG/10ML-% IV SOSY
PREFILLED_SYRINGE | INTRAVENOUS | Status: DC | PRN
  Administered 2023-08-12 (×2): 5 mg via INTRAVENOUS

## 2023-08-12 MED ORDER — MEPERIDINE HCL 25 MG/ML IJ SOLN: 6.2500 mg | INTRAMUSCULAR | Status: DC | PRN

## 2023-08-12 MED ORDER — ONDANSETRON HCL 4 MG/2ML IJ SOLN
INTRAMUSCULAR | Status: DC | PRN
  Administered 2023-08-12: 4 mg via INTRAVENOUS

## 2023-08-12 MED ORDER — TRAZODONE HCL 100 MG PO TABS: 100.0000 mg | ORAL_TABLET | Freq: Every evening | ORAL | Status: AC | PRN

## 2023-08-12 MED ORDER — FENTANYL CITRATE (PF) 100 MCG/2ML IJ SOLN: 25.0000 ug | INTRAMUSCULAR | Status: DC | PRN

## 2023-08-12 MED ORDER — MIDAZOLAM HCL 2 MG/2ML IJ SOLN
INTRAMUSCULAR | Status: AC
  Filled 2023-08-12: qty 2

## 2023-08-12 MED ORDER — PROPOFOL 10 MG/ML IV BOLUS
INTRAVENOUS | Status: DC | PRN
Start: 2023-08-12 — End: 2023-08-12
  Administered 2023-08-12: 110 mg via INTRAVENOUS

## 2023-08-12 MED ORDER — CHLORHEXIDINE GLUCONATE 0.12 % MT SOLN: 15.0000 mL | Freq: Once | OROMUCOSAL | Status: AC

## 2023-08-12 MED ORDER — SUGAMMADEX SODIUM 200 MG/2ML IV SOLN
INTRAVENOUS | Status: DC | PRN
  Administered 2023-08-12: 200 mg via INTRAVENOUS

## 2023-08-12 MED ORDER — LIDOCAINE 2% (20 MG/ML) 5 ML SYRINGE
INTRAMUSCULAR | Status: DC | PRN
  Administered 2023-08-12: 100 mg via INTRAVENOUS

## 2023-08-12 MED ORDER — FENTANYL CITRATE (PF) 250 MCG/5ML IJ SOLN
INTRAMUSCULAR | Status: DC | PRN
  Administered 2023-08-12: 100 ug via INTRAVENOUS

## 2023-08-12 MED ORDER — LACTATED RINGERS IV SOLN: INTRAVENOUS | Status: DC

## 2023-08-12 MED ORDER — FENTANYL CITRATE (PF) 100 MCG/2ML IJ SOLN
INTRAMUSCULAR | Status: AC
  Filled 2023-08-12: qty 2

## 2023-08-12 MED ORDER — PHENYLEPHRINE 80 MCG/ML (10ML) SYRINGE FOR IV PUSH (FOR BLOOD PRESSURE SUPPORT)
PREFILLED_SYRINGE | INTRAVENOUS | Status: DC | PRN
  Administered 2023-08-12: 80 ug via INTRAVENOUS
  Administered 2023-08-12: 40 ug via INTRAVENOUS

## 2023-08-12 MED ORDER — MIDAZOLAM HCL 2 MG/2ML IJ SOLN
INTRAMUSCULAR | Status: DC | PRN
  Administered 2023-08-12: 2 mg via INTRAVENOUS

## 2023-08-12 MED ORDER — PROPOFOL 500 MG/50ML IV EMUL
INTRAVENOUS | Status: DC | PRN
  Administered 2023-08-12: 100 ug/kg/min via INTRAVENOUS

## 2023-08-12 MED ORDER — ROCURONIUM BROMIDE 10 MG/ML (PF) SYRINGE
PREFILLED_SYRINGE | INTRAVENOUS | Status: DC | PRN
  Administered 2023-08-12: 20 mg via INTRAVENOUS
  Administered 2023-08-12: 70 mg via INTRAVENOUS

## 2023-08-12 MED ORDER — DEXAMETHASONE SODIUM PHOSPHATE 10 MG/ML IJ SOLN
INTRAMUSCULAR | Status: DC | PRN
  Administered 2023-08-12: 10 mg via INTRAVENOUS

## 2023-08-12 MED ORDER — PHENYLEPHRINE HCL-NACL 20-0.9 MG/250ML-% IV SOLN: INTRAVENOUS | Status: DC | PRN

## 2023-08-12 NOTE — Anesthesia Procedure Notes (Signed)
 Procedure Name: Intubation Date/Time: 08/12/2023 9:15 AM  Performed by: [name] J, CRNAPre-anesthesia Checklist: Patient identified, Emergency Drugs available, Suction available and Patient being monitored Patient Re-evaluated:Patient Re-evaluated prior to induction Oxygen Delivery Method: Circle system utilized Preoxygenation: Pre-oxygenation with 100% oxygen Induction Type: IV induction Ventilation: Mask ventilation without difficulty Laryngoscope Size: Mac and 4 Grade View: Grade II Tube type: Oral Tube size: 8.5 mm Number of attempts: 1 Airway Equipment and Method: Stylet and Oral airway Placement Confirmation: ETT inserted through vocal cords under direct vision, positive ETCO2 and breath sounds checked- equal and bilateral Secured at: 22 cm Tube secured with: Tape Dental Injury: Teeth and Oropharynx as per pre-operative assessment

## 2023-08-12 NOTE — Discharge Instructions (Addendum)

## 2023-08-12 NOTE — Interval H&P Note (Signed)
 History and Physical Interval Note:  08/12/2023 7:39 AM  Joshua [name]  has presented today for surgery, with the diagnosis of pulmonary nodule right lung.  The various methods of treatment have been discussed with the patient and family. After consideration of risks, benefits and other options for treatment, the patient has consented to  Procedure(s) with comments: BRONCHOSCOPY, WITH BIOPSY USING ELECTROMAGNETIC NAVIGATION (Right) - WITH FLOURO as a surgical intervention.  The patient's history has been reviewed, patient examined, no change in status, stable for surgery.  I have reviewed the patient's chart and labs.  Questions were answered to the patient's satisfaction.     Denson Flake

## 2023-08-12 NOTE — Progress Notes (Signed)
 Xray read by MD,patient ok to discharge when meets pacu criteria.

## 2023-08-12 NOTE — Telephone Encounter (Signed)
 Patient has been made aware. Appointment made with Dr. Alvenia Aus on 6/10. The patient recently had a lung biopsy and due to frequent follow ups,  he could not come in sooner. He will let us  know if the swelling does not get better.

## 2023-08-12 NOTE — Transfer of Care (Signed)
 Immediate Anesthesia Transfer of Care Note  Patient: Joshua [name]  Procedure(s) Performed: BRONCHOSCOPY, WITH BIOPSY USING ELECTROMAGNETIC NAVIGATION (Right) BRONCHOSCOPY, WITH NEEDLE ASPIRATION BIOPSY BRONCHOSCOPY, WITH BRUSH BIOPSY BRONCHOSCOPY, WITH BIOPSY  Patient Location: PACU  Anesthesia Type:General  Level of Consciousness: awake, alert , and oriented  Airway & Oxygen Therapy: Patient Spontanous Breathing and Patient connected to face mask oxygen  Post-op Assessment: Report given to RN and Post -op Vital signs reviewed and stable  Post vital signs: Reviewed and stable  Last Vitals:  Vitals Value Taken Time  BP 149/75 08/12/23 1035  Temp 35.8 C 08/12/23 1035  Pulse 50 08/12/23 1038  Resp 17 08/12/23 1038  SpO2 100 % 08/12/23 1038  Vitals shown include unfiled device data.  Last Pain:  Vitals:   08/12/23 0732  TempSrc:   PainSc: 0-No pain         Complications: No notable events documented.

## 2023-08-12 NOTE — Anesthesia Postprocedure Evaluation (Signed)
 Anesthesia Post Note  Patient: Joshua [name]  Procedure(s) Performed: BRONCHOSCOPY, WITH BIOPSY USING ELECTROMAGNETIC NAVIGATION (Right) BRONCHOSCOPY, WITH NEEDLE ASPIRATION BIOPSY BRONCHOSCOPY, WITH BRUSH BIOPSY BRONCHOSCOPY, WITH BIOPSY     Patient location during evaluation: PACU Anesthesia Type: General Level of consciousness: awake and alert Pain management: pain level controlled Vital Signs Assessment: post-procedure vital signs reviewed and stable Respiratory status: spontaneous breathing, nonlabored ventilation, respiratory function stable and patient connected to nasal cannula oxygen Cardiovascular status: blood pressure returned to baseline and stable Postop Assessment: no apparent nausea or vomiting Anesthetic complications: no   No notable events documented.  Last Vitals:  Vitals:   08/12/23 1100 08/12/23 1110  BP: 109/64 (!) 142/66  Pulse: (!) 49 (!) 46  Resp: 17 20  Temp:  36.6 C  SpO2: 93% 93%    Last Pain:  Vitals:   08/12/23 1110  TempSrc:   PainSc: 0-No pain   Pain Goal:                   Leslye Rast

## 2023-08-12 NOTE — Op Note (Signed)
 Video Bronchoscopy with Robotic Assisted Bronchoscopic Navigation   Date of Operation: 08/12/2023   Pre-op Diagnosis: Right upper lobe mass/consolidation  Post-op Diagnosis: Same  Surgeon: Racheal Buddle  Assistants: None  Anesthesia: General endotracheal anesthesia  Operation: Flexible video fiberoptic bronchoscopy with robotic assistance and biopsies.  Estimated Blood Loss: Minimal  Complications: None  Indications and History: Joshua [name] is a 68 y.o. male with history of non-small cell lung cancer treated by SBRT to the right upper lobe 02/2018.  His repeat imaging showed an area of adjacent progressive consolidation, enlarging on serial imaging with some mild hypermetabolism on PET scan.  Recommendation made to assess this area via robotic assisted navigational bronchoscopy to ensure no evidence for recurrence.  The risks, benefits, complications, treatment options and expected outcomes were discussed with the patient.  The possibilities of pneumothorax, pneumonia, reaction to medication, pulmonary aspiration, perforation of a viscus, bleeding, failure to diagnose a condition and creating a complication requiring transfusion or operation were discussed with the patient who freely signed the consent.    Description of Procedure: The patient was seen in the Preoperative Area, was examined and was deemed appropriate to proceed.  The patient was taken to [facility] room 3, identified as Genetta Kenning and the procedure verified as Flexible Video Fiberoptic Bronchoscopy.  A Time Out was held and the above information confirmed.   Prior to the date of the procedure a high-resolution CT scan of the chest was performed. Utilizing ION software program a virtual tracheobronchial tree was generated to allow the creation of distinct navigation pathways to the patient's parenchymal abnormalities. After being taken to the operating room general anesthesia was initiated and the patient   was orally intubated. The video fiberoptic bronchoscope was introduced via the endotracheal tube and a general inspection was performed which showed normal right and left lung anatomy. Aspiration of the bilateral mainstems was completed to remove any remaining secretions. Robotic catheter inserted into patient's endotracheal tube.   Target #1 right upper lobe consolidation/mass: The distinct navigation pathways prepared prior to this procedure were then utilized to navigate to patient's lesion identified on CT scan. The robotic catheter was secured into place and the vision probe was withdrawn.  Lesion location was approximated using fluoroscopy.  Local registration and targeting was performed using [employer] Cios mobile C-arm three-dimensional imaging. Under fluoroscopic guidance transbronchial needle brushings, transbronchial needle biopsies, and transbronchial forceps biopsies were performed to be sent for cytology and pathology.  Needle-in-lesion was confirmed using Cios mobile C-arm.   At the end of the procedure a general airway inspection was performed and there was no evidence of active bleeding. The bronchoscope was removed.  The patient tolerated the procedure well. There was no significant blood loss and there were no obvious complications. A post-procedural chest x-ray is pending.  Samples Target #1: 1. Transbronchial needle brushings from right upper lobe mass 2. Transbronchial Wang needle biopsies from right upper lobe mass 3. Transbronchial forceps biopsies from right upper lobe mass   Plans:  The patient will be discharged from the PACU to home when recovered from anesthesia and after chest x-ray is reviewed. We will review the cytology, pathology and microbiology results with the patient when they become available. Outpatient followup will be with Braden Caddy, NP.    Racheal Buddle, MD, PhD 08/12/2023, 10:29 AM [facility] [phone] or if no  answer before 7:00PM call [phone] For any issues after 7:00PM please call eLink [phone]

## 2023-08-13 ENCOUNTER — Encounter (HOSPITAL_COMMUNITY): Payer: Self-pay | Admitting: Emergency Medicine

## 2023-08-14 ENCOUNTER — Ambulatory Visit

## 2023-08-14 LAB — CYTOLOGY - NON PAP

## 2023-08-19 ENCOUNTER — Ambulatory Visit: Admitting: Acute Care

## 2023-08-20 ENCOUNTER — Ambulatory Visit: Admitting: Acute Care

## 2023-08-20 ENCOUNTER — Encounter: Payer: Self-pay | Admitting: Acute Care

## 2023-08-20 VITALS — BP 133/57 | HR 51 | Ht 69.0 in | Wt 232.8 lb

## 2023-08-20 DIAGNOSIS — J441 Chronic obstructive pulmonary disease with (acute) exacerbation: Secondary | ICD-10-CM | POA: Diagnosis not present

## 2023-08-20 DIAGNOSIS — J3489 Other specified disorders of nose and nasal sinuses: Secondary | ICD-10-CM | POA: Diagnosis not present

## 2023-08-20 DIAGNOSIS — R911 Solitary pulmonary nodule: Secondary | ICD-10-CM | POA: Diagnosis not present

## 2023-08-20 DIAGNOSIS — Z9889 Other specified postprocedural states: Secondary | ICD-10-CM | POA: Diagnosis not present

## 2023-08-20 DIAGNOSIS — F17200 Nicotine dependence, unspecified, uncomplicated: Secondary | ICD-10-CM

## 2023-08-20 DIAGNOSIS — F17210 Nicotine dependence, cigarettes, uncomplicated: Secondary | ICD-10-CM

## 2023-08-20 MED ORDER — PREDNISONE 10 MG PO TABS: ORAL_TABLET | ORAL | 0 refills | Status: AC

## 2023-08-20 MED ORDER — OHTUVAYRE 3 MG/2.5ML IN SUSP: 1.0000 | Freq: Two times a day (BID) | RESPIRATORY_TRACT | Status: AC

## 2023-08-20 MED ORDER — DOXYCYCLINE HYCLATE 100 MG PO TABS: 100.0000 mg | ORAL_TABLET | Freq: Two times a day (BID) | ORAL | 0 refills | Status: AC

## 2023-08-20 NOTE — Progress Notes (Signed)
 History of Present Illness Joshua [name] is a 68 y.o. male current every day smoker referred October 2019 to Dr. Thelda [name] for COPD by Joshua [name]. Past medical history significant for lung cancer diagnosed December 2019 for which patient received SBRT treatment. Patient was last seen in the office by Dr. Thelda [name] in 2021. He was lost to follow-up for serial CT scans , he re-established with us  06/2023, and repeat imaging was done.  CT Chest was concerning for Focal opacity in the inferior right upper lobe extending to the hilum has increased in size and is now more dense/masslike measuring 4.5 x 3.4 x 1.7 cm. Margins are slightly spiculated and there is tethering to the peripheral pleura. Findings are worrisome for malignancy. PET scan was ordered, which showed a low grade SUV of  3.9 in the right upper lobe. He underwent bronchoscopy with biopsies 08/12/2023. He is here today to follow up on cytology and to ensure he has done well after the procedure.    08/20/2023 Pt. Presents for follow up after bronchoscopy with biopsies.  He states he is doing well since the procedure.  He denies any bleeding, fever, discolored secretions, worsening dyspnea, or adverse reaction to anesthesia.  We have reviewed the results of his cytology from the right upper lobe fine-needle aspiration and brushings.  Cytology was negative for malignancy, positive for abundant fibroblastic stroma.  On PET scan this right upper lobe nodule was positive for an SUV of 3.9.  They specified this could be infectious or inflammatory , however low-grade malignancy was not completely excluded.  I am still concerned about this pulmonary nodule.  We will do a 9-month follow-up CT scan to reevaluate the nodule for stability.  It appears the patient is currently flaring with the COPD.  He is wheezing and coughing up discolored secretions.  He states that shortness of breath is a little bit worse than his baseline.  We will treat  him for COPD flare with doxycycline  twice daily x 7 days and a prednisone  taper.  Patient is in agreement with the 3-month follow-up CT chest to reevaluate nodule of concern.  I have asked him to follow-up with me sooner if he does not feel better after treatment for the COPD flare.  He is compliant with his Trelegy daily.  I have added Ohtuvayre  nebs as part of his maintenance regiment.  If the medication is too expensive he is can let me know.  He understands he needs to follow-up at a sooner interval if he is not improving. .  Test Results: Cytology 08/12/2023 A. LUNG, RUL, FINE NEEDLE ASPIRATIOMN:  - Benign respiratory epithelium and pulmonary macrophages  - Abundant fibroblastic stroma  - No evidence of malignancy   B. LUNG, RUL, BRUSHING:  - Benign respiratory epithelial cell  - No evidence of malignancy.   COMMENT:     Latest Ref Rng & Units 08/12/2023    7:10 AM 11/09/2022    2:47 AM 11/08/2022    6:40 PM  CBC  WBC 4.0 - 10.5 K/uL 9.4  15.0  12.2   Hemoglobin 13.0 - 17.0 g/dL 16.1  09.6  04.5   Hematocrit 39.0 - 52.0 % 46.4  41.1  43.0   Platelets 150 - 400 K/uL 144  178  151        Latest Ref Rng & Units 08/12/2023    7:10 AM 02/06/2023   10:54 AM 01/17/2023    3:42 PM  BMP  Glucose 70 -  99 mg/dL 91  45  64   BUN 8 - 23 mg/dL 20  11  10    Creatinine 0.61 - 1.24 mg/dL 5.78  4.69  6.29   BUN/Creat Ratio 10 - 24  9  9    Sodium 135 - 145 mmol/L 138  145  143   Potassium 3.5 - 5.1 mmol/L 5.1  4.0  3.8   Chloride 98 - 111 mmol/L 106  100  99   CO2 22 - 32 mmol/L 22  23  30    Calcium  8.9 - 10.3 mg/dL 8.7  8.8  8.6     BNP    Component Value Date/Time   BNP 294.2 (H) 01/17/2023 1542   BNP 238.7 (H) 06/17/2022 0722    ProBNP No results found for: "PROBNP"  PFT    Component Value Date/Time   FEV1PRE 1.75 02/05/2018 1347   FEV1POST 2.09 02/05/2018 1347   FVCPRE 3.05 02/05/2018 1347   FVCPOST 3.30 02/05/2018 1347   TLC 7.59 02/05/2018 1347   DLCOUNC 23.38  02/05/2018 1347   PREFEV1FVCRT 58 02/05/2018 1347   PSTFEV1FVCRT 63 02/05/2018 1347    DG Chest Port 1 View Result Date: 08/12/2023 EXAM: 1 VIEW XRAY OF THE CHEST 08/12/2023 11:03:00 AM COMPARISON: 1-view chest x-ray 06/12/2022. PET scan 08/01/2023. CLINICAL HISTORY: [id number] S/P bronchoscopy with biopsy [id number]. S/P bronchoscopy. S/P bronchoscopy with biopsy [id number]. S/P bronchoscopy. FINDINGS: LUNGS AND PLEURA: The lung volumes are low. Linear density in the right upper lobe is similar to the prior study. No focal pulmonary opacity. No pulmonary edema. No pleural effusion. No pneumothorax. HEART AND MEDIASTINUM: Atherosclerotic changes are again noted in the aorta. No acute abnormality of the cardiac and mediastinal silhouettes. BONES AND SOFT TISSUES: No acute osseous abnormality. IMPRESSION: 1. No acute cardiopulmonary pathology. 2. Linear density in the right upper lobe, similar to the prior study. Electronically signed by: Joshua Leas MD 08/12/2023 01:52 PM EDT RP Workstation: [id number]   DG C-ARM BRONCHOSCOPY Result Date: 08/12/2023 C-ARM BRONCHOSCOPY: Fluoroscopy was utilized by the requesting physician.  No radiographic interpretation.   DG C-Arm 1-60 Min-No Report Result Date: 08/12/2023 Fluoroscopy was utilized by the requesting physician.  No radiographic interpretation.   VAS US  LOWER EXTREMITY VENOUS (DVT) Result Date: 08/07/2023  Lower Venous DVT Study Patient Name:  Joshua [name]  Date of Exam:   08/07/2023 Medical Rec #: [id number]           Accession #:    [id number] Date of Birth: 1955-04-07           Patient Gender: M Patient Age:   36 years Exam Location:  [address] Procedure:      VAS US  LOWER EXTREMITY VENOUS (DVT) Referring Phys: Joshua [name] --------------------------------------------------------------------------------  Indications: Pain.  Risk Factors: Obesity. Comparison Study: 01/04/17 Performing Technologist: Joshua [name]  Examination Guidelines: A  complete evaluation includes B-mode imaging, spectral Doppler, color Doppler, and power Doppler as needed of all accessible portions of each vessel. Bilateral testing is considered an integral part of a complete examination. Limited examinations for reoccurring indications may be performed as noted. The reflux portion of the exam is performed with the patient in reverse Trendelenburg.  +-----+---------------+---------+-----------+----------+--------------+ RIGHTCompressibilityPhasicitySpontaneityPropertiesThrombus Aging +-----+---------------+---------+-----------+----------+--------------+ CFV  Full           Yes      Yes                                 +-----+---------------+---------+-----------+----------+--------------+   +---------+---------------+---------+-----------+----------+--------------+  LEFT     CompressibilityPhasicitySpontaneityPropertiesThrombus Aging +---------+---------------+---------+-----------+----------+--------------+ CFV      Full           Yes      Yes                                 +---------+---------------+---------+-----------+----------+--------------+ SFJ      Full                                                        +---------+---------------+---------+-----------+----------+--------------+ FV Prox  Full                                                        +---------+---------------+---------+-----------+----------+--------------+ FV Mid   Full                                                        +---------+---------------+---------+-----------+----------+--------------+ FV DistalFull                                                        +---------+---------------+---------+-----------+----------+--------------+ PFV      Full                                                        +---------+---------------+---------+-----------+----------+--------------+ POP      Full           Yes      Yes                                  +---------+---------------+---------+-----------+----------+--------------+ PTV      Full                    Yes                                 +---------+---------------+---------+-----------+----------+--------------+ PERO     Full                    Yes                                 +---------+---------------+---------+-----------+----------+--------------+ ATV      Full                    Yes                                 +---------+---------------+---------+-----------+----------+--------------+  Summary: RIGHT: - No evidence of common femoral vein obstruction.   LEFT: - There is no evidence of deep vein thrombosis in the lower extremity.  - No cystic structure found in the popliteal fossa.  *See table(s) above for measurements and observations. Electronically signed by Irvin Mantel on 08/07/2023 at 4:30:31 PM.    Final    NM PET Image Initial (PI) Skull Base To Thigh Result Date: 08/04/2023 CLINICAL DATA:  Initial treatment strategy for lung nodule. EXAM: NUCLEAR MEDICINE PET SKULL BASE TO THIGH TECHNIQUE: 11.46 mCi F-18 FDG was injected intravenously. Full-ring PET imaging was performed from the skull base to thigh after the radiotracer. CT data was obtained and used for attenuation correction and anatomic localization. Fasting blood glucose: 85 mg/dl COMPARISON:  CT chest without contrast 07/17/2023 PET-CT 02/09/2018 FINDINGS: Mediastinal blood pool activity: SUV max 2.6 Liver activity: SUV max 3.2 NECK: There is no specific abnormal uptake seen in the neck including along lymph node change of the submandibular, posterior triangle or internal jugular region. Near symmetric uptake along the visualized intracranial compartment. Incidental CT findings: The parotid glands, submandibular glands and thyroid gland are unremarkable. Visualized paranasal sinuses and mastoid air cells are clear. Diffuse vascular calcifications are seen. Streak artifact related to the  patient's spinal fixation hardware. Chest: No abnormal uptake seen above blood pool in the axillary regions, hilum or mediastinum. On the prior CT scan from 07/17/2023 was a nodular infiltrative area seen measuring 4.5 x 3.4 cm in the axial plane. This area today measured in a similar fashion measures 4.6 x 3.0 cm. Uptake in this area has maximum SUV of 3.9. No other areas of uptake along the lungs. Incidental CT findings: Heart is nonenlarged. No pericardial effusion. Significant coronary artery calcifications. The thoracic aorta is normal course and caliber with scattered vascular calcifications. No pleural effusion or pneumothorax. Apical subpleural blebs identified. Noncalcified nodule identified in the anterior aspect of the right lower lobe on image 104 measuring 6 mm. This has been present since 2019 it is not show any abnormal uptake. No additional imaging follow-up of this nodule. Abdomen and pelvis:: There is physiologic distribution radiotracer along the parenchymal organs, bowel and renal collecting systems. Incidental CT findings: Large bowel is normal course and caliber. Scattered colonic stool. Normal appendix in the right lower quadrant. Few sigmoid colon diverticula. Stomach is underdistended and has slight wall thickening, nonspecific. Please correlate with any symptoms. Small bowel is nondilated. Stones in the non dilated gallbladder. No renal or ureteral stones. Grossly the spleen, pancreas and adrenal glands are preserved. Few prominent less than 1 cm size retroperitoneal nodes identified, not pathologic by size criteria. Surgical changes of aorto bi femoral bypass graft. IVC filter noted. SKELETON: No abnormal uptake seen along the visualized osseous structures. Incidental CT findings: Scattered degenerative changes along the spine and pelvis. Streak artifact related to the patient's dental hardware. There is soft tissue thickening along the inguinal regions which is likely postsurgical.  IMPRESSION: Consolidative nodular area identified once again in the perihilar right upper lobe has low level abnormal uptake maximum SUV of 3.9. Again there is a differential. This could be infectious or inflammatory. Low-grade malignancy is difficult to completely exclude. Follow-up options include follow-up CT imaging in 6-12 weeks versus a more aggressive approach including sampling or bronchoscopy. Please correlate with clinical presentation. No additional areas of abnormal uptake including along lymph node chains. Ancillary findings include extensive vascular calcifications. Colonic diverticula. Aorto bi femoral bypass graft. IVC filter. Gallstones. Electronically Signed  By: Adrianna Horde M.D.   On: 08/04/2023 12:04     Past medical hx Past Medical History:  Diagnosis Date   AC (acromioclavicular) joint bone spurs, unspecified laterality 11/25/2016   Arthritis    "back" (05/16/2015)   CAD in native artery, occluded Lcx and rotational atherectomy to LAD with DES 05/17/15 05/18/2015   Cavitary lesion of lung 02/25/2018   Chronic back pain    "all my back"   Chronic bronchitis (HCC)    COPD (chronic obstructive pulmonary disease) (HCC)    Diabetes mellitus without complication (HCC)    pt states he is no longer diabetic, no medications   GERD (gastroesophageal reflux disease)    H/O blood clots    s/p IVC filter ~ 2010: per IR 12/17/16, would not recommended retrieval unless filterl causes new concerns before one of my neck ORs"   History of gout    Hyperlipidemia    Hypertension    Lung cancer (HCC) 02/25/2018   bronchoscopy, RUL + non-small cell carcinoma   Neuromuscular disorder (HCC)    PAD (peripheral artery disease) (HCC)    Phlebitis    Positive TB test    Seasonal allergies    Shortness of breath    Spinal disease      Social History   Tobacco Use   Smoking status: Every Day    Current packs/day: 0.00    Average packs/day: 0.5 packs/day for 49.0 years (24.5 ttl pk-yrs)     Types: Cigarettes    Start date: 02/10/1972    Last attempt to quit: 02/09/2021    Years since quitting: 2.5    Passive exposure: Current (Wife is a Smoker)   Smokeless tobacco: Never   Tobacco comments:    half a pack a day   Vaping Use   Vaping status: Never Used  Substance Use Topics   Alcohol  use: Not Currently   Drug use: No    Mr.Kibbe reports that he has been smoking cigarettes. He started smoking about 51 years ago. He has a 24.5 pack-year smoking history. He has been exposed to tobacco smoke. He has never used smokeless tobacco. He reports that he does not currently use alcohol . He reports that he does not use drugs.  Tobacco Cessation: Ready to quit: Not Answered Counseling given: Not Answered Tobacco comments: half a pack a day  Patient is a current everyday smoker, smoking 1/2 pack/day  He has been counseled to quit smoking completely  Past surgical hx, Family hx, Social hx all reviewed.  Current Outpatient Medications on File Prior to Visit  Medication Sig   albuterol  (PROAIR  HFA) 108 (90 Base) MCG/ACT inhaler Inhale 1-2 puffs into the lungs every 6 (six) hours as needed for wheezing or shortness of breath.   albuterol  (PROVENTIL ) (2.5 MG/3ML) 0.083% nebulizer solution Take 3 mLs (2.5 mg total) by nebulization every 6 (six) hours as needed for wheezing or shortness of breath.   aspirin  EC 81 MG tablet Take 81 mg by mouth daily.   carvedilol  (COREG ) 6.25 MG tablet Take 1 tablet (6.25 mg total) by mouth 2 (two) times daily.   Cholecalciferol (VITAMIN D) 50 MCG (2000 UT) tablet Take 2,000 Units by mouth daily.   colchicine  0.6 MG tablet TAKE 2 TABLETS BY MOUTH AS  DIRECTED THEN 1 TABLET  AFTER 1 HOUR. CAN TAKE 1  TABLET TWICE DAILY  THEREAFTER.   EPINEPHrine  (PRIMATENE  MIST) 0.125 MG/ACT AERO Inhale 1 puff into the lungs daily as needed (COPD flare).  ezetimibe  (ZETIA ) 10 MG tablet TAKE 1 TABLET BY MOUTH DAILY   Fluticasone -Umeclidin-Vilant (TRELEGY ELLIPTA )  200-62.5-25 MCG/ACT AEPB Inhale 1 puff into the lungs every evening.   Fluticasone -Umeclidin-Vilant (TRELEGY ELLIPTA ) 200-62.5-25 MCG/ACT AEPB Inhale 1 puff into the lungs daily at 2 PM.   HYDROcodone -acetaminophen  (NORCO) 5-325 MG tablet Take 1 tablet by mouth every 6 (six) hours as needed for moderate pain.   olmesartan  (BENICAR ) 40 MG tablet Take 1 tablet (40 mg total) by mouth daily.   omeprazole  (PRILOSEC) 40 MG capsule TAKE 1 CAPSULE BY MOUTH DAILY   rosuvastatin  (CRESTOR ) 40 MG tablet TAKE 1 TABLET BY MOUTH DAILY   traZODone  (DESYREL ) 100 MG tablet Take 1 tablet (100 mg total) by mouth at bedtime as needed for sleep.   furosemide  (LASIX ) 20 MG tablet Take 1 tablet (20 mg total) by mouth daily.   No current facility-administered medications on file prior to visit.     Allergies  Allergen Reactions   Zestril  [Lisinopril ] Hives and Itching   Contrast Media [Iodinated Contrast Media] Hives and Itching    OK if pt has Benadryl  pre-med.   Elavil  [Amitriptyline ] Nausea Only and Other (See Comments)    Makes pt "feel weird"   Gadolinium Derivatives Hives and Itching   Motrin [Ibuprofen] Other (See Comments)    Internal bleeding    Review Of Systems:  Constitutional:   No  weight loss, night sweats,  Fevers, chills, fatigue, or  lassitude.  HEENT:   No headaches,  Difficulty swallowing,  Tooth/dental problems, or  Sore throat,                No sneezing, itching, ear ache, nasal congestion, post nasal drip,   CV:  No chest pain,  Orthopnea, PND, trace swelling in lower extremities, No anasarca, dizziness, palpitations, syncope.   GI  No heartburn, indigestion, abdominal pain, nausea, vomiting, diarrhea, change in bowel habits, loss of appetite, bloody stools.   Resp: + baseline  shortness of breath with exertion less at rest.  + excess mucus, + productive cough,  No non-productive cough,  No coughing up of blood.  + change in color of mucus.  + wheezing.  No chest wall  deformity  Skin: no rash or lesions.  GU: no dysuria, change in color of urine, no urgency or frequency.  No flank pain, no hematuria   MS:  No joint pain or swelling.  No decreased range of motion.  No back pain.  Psych:  No change in mood or affect. No depression or anxiety.  No memory loss.   Vital Signs BP (!) 133/57 (BP Location: Left Arm, Patient Position: Sitting, Cuff Size: Large)   Pulse (!) 51   Ht 5\' 9"  (1.753 m)   Wt 232 lb 12.8 oz (105.6 kg)   SpO2 97%   BMI 34.38 kg/m    Physical Exam:  General- No distress,  A&Ox3, pleasant ENT: No sinus tenderness, TM clear, pale nasal mucosa, no oral exudate,no post nasal drip, no LAN Cardiac: S1, S2, regular rate and rhythm, no murmur Chest: + wheeze/ No rales/ dullness; no accessory muscle use, no nasal flaring, no sternal retractions, diminished per bases bilaterally Abd.: Soft Non-tender,  ND, BS +, Body mass index is 34.38 kg/m.  Ext: No clubbing cyanosis,  trace Bilateral lower extremity edema Neuro: Physical deconditioning, moving all extremities x 4, alert and oriented x 3 Skin: No rashes, warm and dry, no obvious skin lesions Psych: normal mood and behavior   Assessment/Plan  Post bronchoscopy with biopsies Current everyday smoker Pulmonary nodule with PET avidity Current flare of his COPD Plan I am glad you have done well after the bronchoscopy and biopsy. Your Biopsies were negative for malignancy, but I an concerned about this nodule. We will do a 3 month follow up scan to reassess for stability.  You will get a call closer to the time to get this scheduled. You will follow up with me after the scan to review the results. I have sent in prescriptions for doxycycline  , an antibiotic for your COPD Flare. Take one tablet in the morning and one in the evening x 7 days. Eat Activia Yogurt while on antibiotic.  I have also sent in a prednisone  taper. Prednisone  taper; 10 mg tablets: 4 tabs x 2 days, 3 tabs x 2  days, 2 tabs x 2 days 1 tab x 2 days then stop.  I have referred you to ENT to evaluate your nasal drainage . You will get a call to get this scheduled.  Continue using your Trelegy daily and albuterol  rescue as needed. We will send in a prescription for ohtuvayre , a neb treatment.  Use in the morning and in the evening.  This may help with your breathing flares.  Call me if this medication is too expensive and we can see if we can get this for you at a discounted rate. Let me know if you have any blood in your sputum. Please contact office for sooner follow up if symptoms do not improve or worsen or seek emergency care    I spent 40 minutes dedicated to the care of this patient on the date of this encounter to include pre-visit review of records, face-to-face time with the patient discussing conditions above, post visit ordering of testing, clinical documentation with the electronic health record, making appropriate referrals as documented, and communicating necessary information to the patient's healthcare team.    Raejean Bullock, NP 08/20/2023  9:09 AM

## 2023-08-20 NOTE — Patient Instructions (Addendum)
 It is good to see you today.  I am glad you have done well after the bronchoscopy and biopsy. Your Biopsies were negative for malignancy, but I an concerned about this nodule. We will do a 3 month follow up scan to reassess for stability.  You will get a call closer to the time to get this scheduled. You will follow up with me after the scan to review the results. I have sent in prescriptions for doxycycline  , an antibiotic for your COPD Flare. Take one tablet in the morning and one in the evening x 7 days. Eat Activia Yogurt while on antibiotic.  I have also sent in a prednisone  taper. Prednisone  taper; 10 mg tablets: 4 tabs x 2 days, 3 tabs x 2 days, 2 tabs x 2 days 1 tab x 2 days then stop.  I have referred you to ENT to evaluate your nasal drainage . You will get a call to get this scheduled.  Continue using your Trelegy daily and albuterol  rescue as needed. We will send in a prescription for ohtuvayre , a neb treatment.  Use in the morning and in the evening.  This may help with your breathing flares.  Let me know if you have any blood in your sputum. Please contact office for sooner follow up if symptoms do not improve or worsen or seek emergency care

## 2023-08-25 ENCOUNTER — Telehealth: Payer: Self-pay

## 2023-08-25 NOTE — Telephone Encounter (Signed)
 Called patient to see when they wanted to come here to sign the ohtuvayre  paperwork paper will be left a front desk and patient will come tomorrow 6/10 around lunch

## 2023-08-26 ENCOUNTER — Ambulatory Visit: Attending: Cardiovascular Disease | Admitting: Cardiovascular Disease

## 2023-08-26 ENCOUNTER — Encounter: Payer: Self-pay | Admitting: Cardiovascular Disease

## 2023-08-26 VITALS — BP 128/78 | HR 53 | Ht 69.0 in | Wt 233.6 lb

## 2023-08-26 DIAGNOSIS — Z720 Tobacco use: Secondary | ICD-10-CM

## 2023-08-26 DIAGNOSIS — I739 Peripheral vascular disease, unspecified: Secondary | ICD-10-CM

## 2023-08-26 DIAGNOSIS — I2510 Atherosclerotic heart disease of native coronary artery without angina pectoris: Secondary | ICD-10-CM | POA: Diagnosis not present

## 2023-08-26 DIAGNOSIS — I5032 Chronic diastolic (congestive) heart failure: Secondary | ICD-10-CM

## 2023-08-26 DIAGNOSIS — I10 Essential (primary) hypertension: Secondary | ICD-10-CM

## 2023-08-26 DIAGNOSIS — E785 Hyperlipidemia, unspecified: Secondary | ICD-10-CM

## 2023-08-26 MED ORDER — FUROSEMIDE 20 MG PO TABS: 20.0000 mg | ORAL_TABLET | Freq: Every day | ORAL | 3 refills | Status: AC

## 2023-08-26 MED ORDER — PANTOPRAZOLE SODIUM 40 MG PO TBEC: 40.0000 mg | DELAYED_RELEASE_TABLET | Freq: Every day | ORAL | 3 refills | Status: AC

## 2023-08-26 NOTE — Telephone Encounter (Signed)
 Copied from CRM [id number]. Topic: General - Other >> Aug 26, 2023  8:16 AM Crist Dominion wrote: Reason for CRM: Patients wife Kylee Nardozzi) states she is going to stop by the clinic later today to pick up paperwork that needs to be filled out.   Paperwork already at front.NFN

## 2023-08-26 NOTE — Progress Notes (Signed)
 Cardiology Office Note   Date:  08/26/2023   ID:  SHERI PROWS, DOB 1955/05/01, MRN [id number]  PCP:  Narda Bacon, MD  Cardiologist:  Dr. Renna Cary  No chief complaint on file.      History of Present Illness: Joshua [name] is a 68 y.o. male who is here today for a follow up visit regarding peripheral arterial disease.   He has known history of coronary artery disease status post rotational atherectomy and drug-eluting stent placement to the mid LAD. He has known history of peripheral arterial disease status post aortobifemoral bypass in 2010 at [facility], diabetes mellitus with diabetic neuropathy, hyperlipidemia and tobacco use.  He had worsening right leg claudication in 2019 and thus angiography was performed which showed widely patent aortobifemoral bypass.  There was severe calcified stenosis affecting the ostial SFA with moderate calcified disease in the mid and distal segment and three-vessel runoff below the knee.  On the left side, there was 60% ostial stenosis of the left SFA which was stable from before.  I performed orbital atherectomy and drug-coated balloon angioplasty to the ostial and proximal right SFA via the left brachial artery. He was hospitalized in August of 2022 with suspected GI bleed and was found to be in atrial fibrillation but converted to sinus rhythm.  He was felt to be not a good anticoagulation candidate.  Echocardiogram showed normal LV systolic function.  He had EGD and colonoscopy and was told about stomach ulcers and colon polyps which were removed.  He was seen in 2022 due to rest pain affecting the right lower extremity.  Angiography was performed in October 2022 which showed patent aorto bifemoral bypass.  On the right side, there was significant stenosis in the common femoral artery at the outflow of the bypass with severe calcified ostial profunda stenosis and flush occlusion of the SFA with reconstitution distally with moderate calcified stenosis  in the popliteal artery and two-vessel runoff below the knee.  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  The patient underwent right common femoral artery endarterectomy and profundoplasty with Dr. Charlotte Cookey.  He continued to have severe right lower extremity claudication and ultimately underwent right femoral-popliteal bypass in 2024.   He had left common femoral artery endarterectomy in August of 2024.  Most recent Doppler studies in October showed normal ABI bilaterally.  He had an echocardiogram done in July of 2024 which showed normal LV systolic function with no significant valvular abnormalities.  He was seen in November 2020 for due to increased lower extremity edema.  He was noted to be bradycardic.  Carvedilol  was decreased and olmesartan  was added.  In addition, he was started on small dose furosemide .  His BNP was mildly elevated.    He had worsening right lower extremity edema in May and venous Doppler showed no evidence of DVT.  The dose of furosemide  was increased to 40 mg once daily.  Subsequent labs showed worsening renal function.  He is being followed by pulmonary for lung nodule with plans for repeat imaging in the near future.  Unfortunately, he continues to smoke half a pack per day and reports inability to quit.  No chest pain but he has chronic GERD.  In addition, he has shortness of breath with intermittent wheezing.   Past Medical History:  Diagnosis Date   AC (acromioclavicular) joint bone spurs, unspecified laterality 11/25/2016   Arthritis    "back" (05/16/2015)   CAD in native artery, occluded Lcx and rotational atherectomy to LAD with DES 05/17/15 05/18/2015   Cavitary lesion of  lung 02/25/2018   Chronic back pain    "all my back"   Chronic bronchitis (HCC)    COPD (chronic obstructive pulmonary disease) (HCC)    Diabetes mellitus without complication (HCC)    pt states he is no longer diabetic, no medications   GERD (gastroesophageal reflux disease)    H/O blood clots    s/p IVC filter ~ 2010: per IR 12/17/16, would not recommended retrieval unless filterl causes new concerns before one of my neck ORs"   History of gout    Hyperlipidemia    Hypertension    Lung cancer (HCC) 02/25/2018   bronchoscopy, RUL + non-small cell carcinoma   Neuromuscular disorder (HCC)    PAD (peripheral artery disease) (HCC)    Phlebitis    Positive TB test    Seasonal allergies    Shortness of breath    Spinal disease     Past Surgical History:  Procedure Laterality Date   ABDOMINAL AORTOGRAM W/LOWER EXTREMITY  01/07/2018   ABDOMINAL AORTOGRAM W/LOWER EXTREMITY N/A 01/07/2018   Procedure: ABDOMINAL AORTOGRAM W/LOWER EXTREMITY;  Surgeon: Wenona Hamilton, MD;  [facility]: [facility];  Service: Cardiovascular;  Laterality: N/A;   ABDOMINAL AORTOGRAM W/LOWER EXTREMITY N/A 12/27/2020   Procedure: ABDOMINAL AORTOGRAM W/LOWER EXTREMITY;  Surgeon: Wenona Hamilton, MD;  Location: [facility];  Service: Cardiovascular;  Laterality: N/A;   ABDOMINAL AORTOGRAM W/LOWER EXTREMITY N/A 04/03/2021   Procedure: ABDOMINAL AORTOGRAM W/LOWER EXTREMITY;  Surgeon: Margherita Shell, MD;  [facility]: [facility];  Service: Cardiovascular;  Laterality: N/A;   ABDOMINAL AORTOGRAM W/LOWER EXTREMITY N/A 10/01/2022   Procedure: ABDOMINAL AORTOGRAM W/LOWER EXTREMITY;  Surgeon: Margherita Shell, MD;  [facility]: [facility];  Service: Cardiovascular;  Laterality: N/A;   ANTERIOR CERVICAL DECOMP/DISCECTOMY FUSION  ~ 2001-2009 X 3   ANTERIOR CERVICAL DECOMP/DISCECTOMY FUSION N/A 10/06/2012   Procedure: ANTERIOR CERVICAL DECOMPRESSION/DISCECTOMY FUSION 1 LEVEL;  Surgeon: Augustine Blocker,  MD;  Location: [facility];  Service: Neurosurgery;  Laterality: N/A;  C7T1 anterior cervical decompression with fusion plating and bonegraft    APPLICATION OF WOUND VAC Left 11/08/2022   Procedure: APPLICATION OF WOUND VAC;  Surgeon: Margherita Shell, MD;  Location: [facility];  Service: Vascular;  Laterality: Left;   BACK SURGERY     BIOPSY  11/21/2020   Procedure: BIOPSY;  Surgeon: Sergio Dandy, MD;  Location: [facility];  Service: Endoscopy;;   BRONCHIAL BIOPSY  08/12/2023   Procedure: BRONCHOSCOPY, WITH BIOPSY;  Surgeon: Denson Flake, MD;  Location: [facility] ENDOSCOPY;  Service: Pulmonary;;   BRONCHIAL BRUSHINGS  08/12/2023   Procedure: BRONCHOSCOPY, WITH BRUSH BIOPSY;  Surgeon: Denson Flake, MD;  [facility]: [facility];  Service: Pulmonary;;   BRONCHIAL NEEDLE ASPIRATION BIOPSY  08/12/2023   Procedure: BRONCHOSCOPY, WITH NEEDLE ASPIRATION BIOPSY;  Surgeon: Denson Flake, MD;  [facility]: [facility];  Service: Pulmonary;;   BRONCHOSCOPY, WITH BIOPSY USING ELECTROMAGNETIC NAVIGATION Right 08/12/2023   Procedure: BRONCHOSCOPY, WITH BIOPSY USING ELECTROMAGNETIC NAVIGATION;  Surgeon: Denson Flake, MD;  [facility];  Service: Pulmonary;  Laterality: Right;  WITH FLOURO   CARDIAC CATHETERIZATION  05/16/2015   CARDIAC CATHETERIZATION N/A 05/16/2015   Procedure: Left Heart Cath and Coronary Angiography;  Surgeon: Arleen Lacer, MD;  Location: [facility] INVASIVE CV LAB;  Service: Cardiovascular;  Laterality: N/A;   CARDIAC CATHETERIZATION N/A 05/16/2015   Procedure: Coronary Balloon Angioplasty;  Surgeon: Arleen Lacer, MD;  Location[facility] INVASIVE CV LAB;  Service: Cardiovascular;  Laterality: N/A;   CARDIAC CATHETERIZATION N/A 05/17/2015   Procedure: Coronary Stent Intervention Rotoblater;  Surgeon: Peter M Swaziland, MD;  Location: [facility] INVASIVE CV LAB;  Service: Cardiovascular;  Laterality: N/A;   COLONOSCOPY WITH PROPOFOL  N/A 11/21/2020   Procedure: COLONOSCOPY WITH PROPOFOL ;  Surgeon: Sergio Dandy, MD;  Location: [facility];  Service: Endoscopy;  Laterality: N/A;   ELBOW SURGERY Left 2016   "had bone pieces removed"   ENDARTERECTOMY Left 11/08/2022   Procedure: LEFT ILIOFEMORAL ENDARTERECTOMY;  Surgeon: Margherita Shell, MD;  Location: [facility] OR;  Service: Vascular;  Laterality: Left;   ENDARTERECTOMY FEMORAL Right 12/28/2020   Procedure: RIGHT FEMORAL ARTERY ENDARTERECTOMY;  Surgeon: Margherita Shell, MD;  Location: [facility] OR;  Service: Vascular;  Laterality: Right;   ESOPHAGEAL MANOMETRY N/A 12/30/2016   Procedure: ESOPHAGEAL MANOMETRY (EM);  Surgeon: Tobin Forts, MD;  Location: [facility];  Service: Endoscopy;  Laterality: N/A;   ESOPHAGOGASTRODUODENOSCOPY (EGD) WITH PROPOFOL  N/A 11/21/2020   Procedure: ESOPHAGOGASTRODUODENOSCOPY (EGD) WITH PROPOFOL ;  Surgeon: Sergio Dandy, MD;  Location: [facility];  Service: Endoscopy;  Laterality: N/A;   EXCISIONAL HEMORRHOIDECTOMY     FEMORAL BYPASS  ~ 2010   FEMORAL-POPLITEAL BYPASS GRAFT Right 04/04/2021   Procedure: RIGHT FEMORAL-POPLITEAL ARTERY BYPASS GRAFT;  Surgeon: Margherita Shell, MD;  Location: [facility];  Service: Vascular;  Laterality: Right;   IR RADIOLOGIST EVAL & MGMT  12/17/2016   IVC FILTER PLACEMENT ([facility] HX)  2010   "had blood clots in my back"   NECK SURGERY  2014   2008-2014   [facility] ANGIOPLASTY Right 12/28/2020   Procedure: PATCH ANGIOPLASTY USING;  Surgeon: Margherita Shell, MD;  [facility]: [facility] OR;  Service: Vascular;  Laterality: Right;   PATCH ANGIOPLASTY Left 11/08/2022   Procedure: LEFT FEMORAL ARTERY PATCH ANGIOPLASTY USING HEMASHEILD PLATINUM DACRON PATCH 0.8CM X 15.2CM;  Surgeon: Margherita Shell, MD;  Location: [facility] OR;  Service: Vascular;  Laterality: Left;   PERIPHERAL VASCULAR ATHERECTOMY  01/07/2018   Procedure: PERIPHERAL VASCULAR ATHERECTOMY;  Surgeon: Wenona Hamilton, MD;  [facility];  Service: Cardiovascular;;   PERIPHERAL VASCULAR BALLOON ANGIOPLASTY  01/07/2018   Procedure:  PERIPHERAL VASCULAR BALLOON ANGIOPLASTY;  Surgeon: Wenona Hamilton, MD;  [facility];  Service: Cardiovascular;;   PERIPHERAL VASCULAR CATHETERIZATION N/A 02/14/2016   Procedure: Abdominal Aortogram w/Lower Extremity;  Surgeon: Wenona Hamilton, MD;  [facility];  Service: Cardiovascular;  Laterality: N/A;   PERIPHERAL VASCULAR INTERVENTION Right 04/03/2021   Procedure: PERIPHERAL VASCULAR INTERVENTION;  Surgeon: Margherita Shell, MD;  [facility];  Service: Cardiovascular;  Laterality: Right;   POLYPECTOMY  11/21/2020   Procedure: POLYPECTOMY;  Surgeon: Sergio Dandy, MD;  Location: [facility];  Service: Endoscopy;;   POSTERIOR LAMINECTOMY / DECOMPRESSION LUMBAR SPINE  2006   "bone spurs"   THROMBECTOMY FEMORAL ARTERY Left 11/08/2022   Procedure: THROMBECTOMY SUPERFICIAL FEMORAL ARTERY;  Surgeon: Margherita Shell, MD;  Location[facility];  Service: Vascular;  Laterality: Left;   THROMBECTOMY ILIAC ARTERY N/A 11/08/2022   Procedure: THROMBECTOMY OF AORTA BIFEMORAL GRAFT;  Surgeon: Margherita Shell, MD;  Location: [facility] OR;  Service: Vascular;  Laterality: N/A;   VIDEO BRONCHOSCOPY WITH ENDOBRONCHIAL NAVIGATION Right 02/25/2018   Procedure: VIDEO BRONCHOSCOPY WITH ENDOBRONCHIAL NAVIGATION;  Surgeon: Prudy Brownie, DO;  Location: [facility];  Service: Thoracic;  Laterality: Right;   VIDEO BRONCHOSCOPY WITH ENDOBRONCHIAL ULTRASOUND Right 02/25/2018   Procedure: VIDEO BRONCHOSCOPY WITH ENDOBRONCHIAL ULTRASOUND;  Surgeon: Prudy Brownie, DO;  Location: [facility] OR;  Service: Thoracic;  Laterality: Right;     Current Outpatient Medications  Medication Sig Dispense Refill   albuterol  (PROAIR  HFA) 108 (90 Base) MCG/ACT inhaler Inhale 1-2 puffs into the lungs every 6 (six) hours as needed for wheezing or shortness of breath. 8.5 g 1   albuterol  (PROVENTIL ) (2.5 MG/3ML) 0.083% nebulizer solution Take 3 mLs (2.5 mg total) by nebulization every 6 (six) hours as  needed for wheezing or shortness of breath. 75 mL 5   aspirin  EC 81 MG tablet Take 81 mg by mouth daily.     carvedilol  (COREG ) 6.25 MG tablet Take 1 tablet (6.25 mg total) by mouth 2 (two) times daily. 180 tablet 3   Cholecalciferol (VITAMIN D) 50 MCG (2000 UT) tablet Take 2,000 Units by mouth daily.     colchicine  0.6 MG tablet TAKE 2 TABLETS BY MOUTH AS  DIRECTED THEN 1 TABLET  AFTER 1 HOUR. CAN TAKE 1  TABLET TWICE DAILY  THEREAFTER. 60 tablet 11   doxycycline  (VIBRA -TABS) 100 MG tablet Take 1 tablet (100 mg total) by mouth 2 (two) times daily. 14 tablet 0   Ensifentrine  (OHTUVAYRE ) 3 MG/2.5ML SUSP Inhale 1 each into the lungs 2 (two) times daily.     EPINEPHrine  (PRIMATENE  MIST) 0.125 MG/ACT AERO Inhale 1 puff into the lungs daily as needed (COPD flare).     ezetimibe  (ZETIA ) 10 MG tablet TAKE 1 TABLET BY MOUTH DAILY 100 tablet 0   Fluticasone -Umeclidin-Vilant (TRELEGY ELLIPTA ) 200-62.5-25 MCG/ACT AEPB Inhale 1 puff into the lungs every evening.     Fluticasone -Umeclidin-Vilant (TRELEGY ELLIPTA ) 200-62.5-25 MCG/ACT AEPB Inhale 1 puff into the lungs daily at 2 PM.     HYDROcodone -acetaminophen  (NORCO) 5-325 MG tablet Take 1 tablet by mouth every 6 (six) hours as needed for moderate pain. 15 tablet 0   olmesartan  (BENICAR ) 40 MG tablet Take 1 tablet (40 mg total) by mouth daily. 90 tablet 3   pantoprazole  (PROTONIX ) 40 MG tablet Take 1 tablet (40 mg total) by mouth daily. 90 tablet 3   predniSONE  (DELTASONE ) 10 MG tablet Prednisone  taper; 10 mg tablets: 4 tabs x 2 days, 3 tabs x 2 days, 2 tabs x 2 days 1 tab x 2 days then stop. 20 tablet 0   rosuvastatin  (CRESTOR ) 40 MG tablet TAKE 1 TABLET BY MOUTH DAILY 90 tablet 3   traZODone  (DESYREL ) 100 MG tablet Take 1 tablet (100 mg total) by mouth at bedtime as needed for sleep.     furosemide  (LASIX ) 20 MG tablet Take 1 tablet (20 mg total) by mouth daily. 90 tablet 3   No current facility-administered medications for this visit.    Allergies:    Zestril  [lisinopril ], Contrast media [iodinated contrast media], Elavil  [amitriptyline ], Gadolinium derivatives, and Motrin [ibuprofen]    Social History:  The patient  reports that he has been smoking cigarettes. He started smoking about 51 years ago. He has a 24.5 pack-year smoking history. He has been exposed to tobacco smoke. He has never used smokeless tobacco. He reports that he does not currently use alcohol .  He reports that he does not use drugs.   Family History:  The patient's family history includes Aneurysm in his brother; Healthy in his daughter and maternal grandmother; Heart attack in his father; Hypertension in his father and mother; Leukemia in his grandchild; Stomach cancer in his mother; Stroke in his father.    ROS:  Please see the history of present illness.   Otherwise, review of systems are positive for none.   All other systems are reviewed and negative.    PHYSICAL EXAM: VS:  BP 128/78 (BP Location: Left Arm, Patient Position: Sitting)   Pulse (!) 53   Ht 5\' 9"  (1.753 m)   Wt 233 lb 9.6 oz (106 kg)   SpO2 94%   BMI 34.50 kg/m  , BMI Body mass index is 34.5 kg/m. GEN: Well nourished, well developed, in no acute distress  HEENT: normal  Neck: no JVD, carotid bruits, or masses Cardiac: RRR; no murmurs, rubs, or gallops,no edema  Respiratory: Bilateral rhonchi and mild expiratory wheezing.  Diminished breath sounds. GI: soft, nontender, nondistended, + BS MS: no deformity or atrophy  Skin: warm and dry, no rash Neuro:  Strength and sensation are intact Psych: euthymic mood, full affect    EKG:  EKG is not ordered today.   Recent Labs: 10/31/2022: ALT 11 01/17/2023: BNP 294.2 08/12/2023: BUN 20; Creatinine, Ser 1.65; Hemoglobin 14.6; Platelets 144; Potassium 5.1; Sodium 138    Lipid Panel    Component Value Date/Time   CHOL 100 11/09/2022 0247   CHOL 215 (H) 11/21/2017 0810   TRIG 87 11/09/2022 0247   HDL 35 (L) 11/09/2022 0247   HDL 126 11/21/2017  0810   CHOLHDL 2.9 11/09/2022 0247   VLDL 17 11/09/2022 0247   LDLCALC 48 11/09/2022 0247   LDLCALC 75 07/09/2019 1435   LDLDIRECT 107.0 04/21/2015 1352      Wt Readings from Last 3 Encounters:  08/26/23 233 lb 9.6 oz (106 kg)  08/20/23 232 lb 12.8 oz (105.6 kg)  08/12/23 237 lb (107.5 kg)         ASSESSMENT AND PLAN:  1.   Peripheral arterial disease: Status post aortobifemoral bypass, right common femoral artery endarterectomy, right femoral-popliteal bypass and  left common femoral artery endarterectomy.  Currently with no claudication.  Continue medical therapy.  2. Coronary artery disease: Status post atherectomy and drug-eluting stent placement to the LAD.  The patient has no angina.  Continue low-dose aspirin .  3. Tobacco use: I again had a prolonged discussion with him about the importance of smoking cessation.  4. Hyperlipidemia: Continue treatment with ezetimibe  and rosuvastatin .  Most recent lipid profile showed an LDL of 48.  5.  Essential hypertension: Blood pressure is well controlled.  6.  Chronic diastolic heart failure: Right lower extremity edema improved but renal function worsened.  I asked him to decrease furosemide  back to 20 mg daily.  7. GERD: He is having no reflux symptoms.  I gave him a prescription for pantoprazole  to replace omeprazole .    Disposition:   FU with me in 6 months.  Signed,  Antionette Kirks, MD  08/26/2023 1:02 PM    [facility]

## 2023-08-26 NOTE — Patient Instructions (Signed)
 Medication Instructions:  STOP the Omeprazole   START Pantoprazole  40 mg once daily TAKE Furosemide  20 mg once daily  *If you need a refill on your cardiac medications before your next appointment, please call your pharmacy*  Lab Work: None ordered If you have labs (blood work) drawn today and your tests are completely normal, you will receive your results only by: MyChart Message (if you have MyChart) OR A paper copy in the mail If you have any lab test that is abnormal or we need to change your treatment, we will call you to review the results.  Testing/Procedures: None ordered  Follow-Up: At [facility], you and your health needs are our priority.  As part of our continuing mission to provide you with exceptional heart care, our providers are all part of one team.  This team includes your primary Cardiologist (physician) and Advanced Practice Providers or APPs (Physician Assistants and Nurse Practitioners) who all work together to provide you with the care you need, when you need it.  Your next appointment:   6 month(s)  Provider:   Dr. Alvenia Aus  We recommend signing up for the patient portal called "MyChart".  Sign up information is provided on this After Visit Summary.  MyChart is used to connect with patients for Virtual Visits (Telemedicine).  Patients are able to view lab/test results, encounter notes, upcoming appointments, etc.  Non-urgent messages can be sent to your provider as well.   To learn more about what you can do with MyChart, go to [email or web address].

## 2023-09-11 ENCOUNTER — Telehealth: Payer: Self-pay

## 2023-09-11 NOTE — Telephone Encounter (Signed)
 Received [employer] new start paperwork. Completed form and faxed with clinicals and insurance card copy to [facility] [employer]   Phone#: [phone] Fax#: [phone]

## 2023-09-16 NOTE — Telephone Encounter (Signed)
 Received fax from [employer] confirming receipt of [employer]  enrollment form  Siyon Linck, PharmD, MPH, BCPS, CPP Clinical Pharmacist (Rheumatology and Pulmonology)

## 2023-10-17 ENCOUNTER — Institutional Professional Consult (permissible substitution) (INDEPENDENT_AMBULATORY_CARE_PROVIDER_SITE_OTHER): Admitting: Otolaryngology

## 2023-11-18 ENCOUNTER — Other Ambulatory Visit: Payer: Self-pay | Admitting: Cardiovascular Disease

## 2023-12-01 ENCOUNTER — Ambulatory Visit
Admission: RE | Admit: 2023-12-01 | Discharge: 2023-12-01 | Disposition: A | Discharge status: Home / Self Care | Source: Ambulatory Visit | Attending: Acute Care | Admitting: Acute Care

## 2023-12-01 DIAGNOSIS — R911 Solitary pulmonary nodule: Secondary | ICD-10-CM

## 2023-12-08 ENCOUNTER — Other Ambulatory Visit: Payer: Self-pay | Admitting: Cardiovascular Disease

## 2023-12-16 ENCOUNTER — Ambulatory Visit: Admitting: Acute Care

## 2023-12-16 ENCOUNTER — Encounter: Payer: Self-pay | Admitting: Acute Care

## 2023-12-16 VITALS — BP 186/82 | HR 51 | Temp 98.1°F | Ht 69.0 in | Wt 232.8 lb

## 2023-12-16 DIAGNOSIS — F17210 Nicotine dependence, cigarettes, uncomplicated: Secondary | ICD-10-CM

## 2023-12-16 DIAGNOSIS — F17200 Nicotine dependence, unspecified, uncomplicated: Secondary | ICD-10-CM

## 2023-12-16 DIAGNOSIS — J449 Chronic obstructive pulmonary disease, unspecified: Secondary | ICD-10-CM

## 2023-12-16 DIAGNOSIS — R911 Solitary pulmonary nodule: Secondary | ICD-10-CM | POA: Diagnosis not present

## 2023-12-16 DIAGNOSIS — R9389 Abnormal findings on diagnostic imaging of other specified body structures: Secondary | ICD-10-CM | POA: Diagnosis not present

## 2023-12-16 NOTE — Patient Instructions (Addendum)
 It is good to see you today. Your CT chest shows the nodule of concern is stable. We will do a 6 month follow up scan to maintain surveillance for stability. This will be due 05/2024. If you have any unintentional weight loss , or any blood in your sputum, call to be seen right away.  Call if you need us  sooner.  We will give you some Trelegy 200 samples.  Take 1 puff once daily. Rinse mouth after use. Note your daily symptoms > remember red flags for COPD:  Increase in cough, increase in sputum production, increase in shortness of breath or activity intolerance. If you notice these symptoms, please call to be seen.    Please contact office for sooner follow up if symptoms do not improve or worsen or seek emergency care

## 2023-12-16 NOTE — Progress Notes (Signed)
 History of Present Illness Joshua [name] is a 68 y.o. male current every day smoker followed for pulmonary nodules. He is followed by Lauraine Lites NP and Dr. Shelah.    12/16/2023 Discussed the use of AI scribe software for clinical note transcription with the patient, who gave verbal consent to proceed.   Synopsis Joshua [name] is a 68 y.o. male current every day smoker referred October 2019 to Dr. Brenna for COPD by Angeline Barks nurse practitioner. Past medical history significant for lung cancer diagnosed December 2019 for which patient received SBRT treatment. Patient was last seen in the office by Dr. Brenna in 2021. He was lost to follow-up for serial CT scans , he re-established with us  06/2023, and repeat imaging was done.  CT Chest was concerning for Focal opacity in the inferior right upper lobe extending to the hilum has increased in size and is now more dense/masslike measuring 4.5 x 3.4 x 1.7 cm. Margins are slightly spiculated and there is tethering to the peripheral pleura. Findings are worrisome for malignancy. PET scan was ordered, which showed a low grade SUV of  3.9 in the right upper lobe. He underwent bronchoscopy with biopsies 08/12/2023. Biopsies were negative for malignancy. He is here for follow up after a 3 month surveillance  CT Chest.  History of Present Illness Joshua [name] is a 68 year old male with lung cancer who presents for follow-up of a stable lung mass.  He has a history of lung cancer diagnosed approximately five years ago and underwent three radiation treatments at that time. A recent CT scan shows a 5 x 3 x 2.9 cm mass that is unchanged from previous studies. The mass was biopsied in May 2025 and showed benign results.Per radiology, there is still some concern for recurrent disease.  No breathing issues, weight loss, or hemoptysis at this time. He is currently using Trelegy and albuterol  inhalers. He has encountered financial difficulties in  obtaining Trelegy due to a high deductible. His copay for Trelegy is fifty dollars, which he finds manageable if confirmed.We will give him 2 samples of Trelegy today. His wife is going to check with insurance to see if the cost is $50.00.       Test Results: CT chest 12/01/2023 LUNGS AND PLEURA: 5.3 x 2.9 cm mass like opacity in the medial right upper lobe (image 56), unchanged from recent studies but progressive from 2020, possibly reflecting radiation changes although recurrent tumor is not excluded. Mild paraseptal emphysematous changes, upper lung predominant. 6 mm subpleural nodule in the right lower lobe (image 121), chronic benign.   SOFT TISSUES/BONES: Lower cervical spine fixation hardware. Degenerative changes of the visualized thoracolumbar spine.   UPPER ABDOMEN: Cholelithiasis, without associated inflammatory changes.   IMPRESSION: 1. Stable right upper lobe mass-like opacity, progressive from remote priors, but stable compared to most recent imagig. While some of this reflects radiation changes, underlying recurrent tumor remains possible. 2. Additional ancillary findings as above.  Cytology 08/12/2023 A. LUNG, RUL, FINE NEEDLE ASPIRATIOMN:  - Benign respiratory epithelium and pulmonary macrophages  - Abundant fibroblastic stroma  - No evidence of malignancy   B. LUNG, RUL, BRUSHING:  - Benign respiratory epithelial cell  - No evidence of malignancy.      Latest Ref Rng & Units 08/12/2023    7:10 AM 11/09/2022    2:47 AM 11/08/2022    6:40 PM  CBC  WBC 4.0 - 10.5 K/uL 9.4  15.0  12.2  Hemoglobin 13.0 - 17.0 g/dL 85.3  86.2  85.8   Hematocrit 39.0 - 52.0 % 46.4  41.1  43.0   Platelets 150 - 400 K/uL 144  178  151        Latest Ref Rng & Units 08/12/2023    7:10 AM 02/06/2023   10:54 AM 01/17/2023    3:42 PM  BMP  Glucose 70 - 99 mg/dL 91  45  64   BUN 8 - 23 mg/dL 20  11  10    Creatinine 0.61 - 1.24 mg/dL 8.34  8.82  8.89   BUN/Creat Ratio 10 - 24  9   9    Sodium 135 - 145 mmol/L 138  145  143   Potassium 3.5 - 5.1 mmol/L 5.1  4.0  3.8   Chloride 98 - 111 mmol/L 106  100  99   CO2 22 - 32 mmol/L 22  23  30    Calcium  8.9 - 10.3 mg/dL 8.7  8.8  8.6     BNP    Component Value Date/Time   BNP 294.2 (H) 01/17/2023 1542   BNP 238.7 (H) 06/17/2022 0722    ProBNP No results found for: PROBNP  PFT    Component Value Date/Time   FEV1PRE 1.75 02/05/2018 1347   FEV1POST 2.09 02/05/2018 1347   FVCPRE 3.05 02/05/2018 1347   FVCPOST 3.30 02/05/2018 1347   TLC 7.59 02/05/2018 1347   DLCOUNC 23.38 02/05/2018 1347   PREFEV1FVCRT 58 02/05/2018 1347   PSTFEV1FVCRT 63 02/05/2018 1347    CT CHEST WO CONTRAST Result Date: 12/06/2023 EXAM: CT CHEST WITHOUT CONTRAST 12/01/2023 03:43:15 PM TECHNIQUE: CT of the chest was performed without the administration of intravenous contrast. Multiplanar reformatted images are provided for review. Automated exposure control, iterative reconstruction, and/or weight based adjustment of the mA/kV was utilized to reduce the radiation dose to as low as reasonably achievable. COMPARISON: PET/CT dated 08/01/2023. CLINICAL HISTORY: Lung nodule, > 8mm. HX of lung cancer; Recent difficulty breathing; Patient states 3 rounds of chemo put lung cancer in remission but was told it has returned. FINDINGS: MEDIASTINUM: Heart and pericardium are unremarkable. The central airways are clear. Moderate 3-vessel coronary atherosclerosis. LYMPH NODES: No mediastinal, hilar or axillary lymphadenopathy. LUNGS AND PLEURA: 5.3 x 2.9 cm mass like opacity in the medial right upper lobe (image 56), unchanged from recent studies but progressive from 2020, possibly reflecting radiation changes although recurrent tumor is not excluded. Mild paraseptal emphysematous changes, upper lung predominant. 6 mm subpleural nodule in the right lower lobe (image 121), chronic benign. SOFT TISSUES/BONES: Lower cervical spine fixation hardware. Degenerative  changes of the visualized thoracolumbar spine. UPPER ABDOMEN: Cholelithiasis, without associated inflammatory changes. IMPRESSION: 1. Stable right upper lobe mass-like opacity, progressive from remote priors. While some of this reflects radiation changes, underlying recurrent tumor remains possible. 2. Additional ancillary findings as above. Electronically signed by: Pinkie Pebbles MD 12/06/2023 08:45 PM EDT RP Workstation: [id number]     Past medical hx Past Medical History:  Diagnosis Date   AC (acromioclavicular) joint bone spurs, unspecified laterality 11/25/2016   Arthritis    back (05/16/2015)   CAD in native artery, occluded Lcx and rotational atherectomy to LAD with DES 05/17/15 05/18/2015   Cavitary lesion of lung 02/25/2018   Chronic back pain    all my back   Chronic bronchitis (HCC)    COPD (chronic obstructive pulmonary disease) (HCC)    Diabetes mellitus without complication (HCC)    pt states he is no  longer diabetic, no medications   GERD (gastroesophageal reflux disease)    H/O blood clots    s/p IVC filter ~ 2010: per IR 12/17/16, would not recommended retrieval unless filterl causes new concerns before one of my neck ORs   History of gout    Hyperlipidemia    Hypertension    Lung cancer (HCC) 02/25/2018   bronchoscopy, RUL + non-small cell carcinoma   Neuromuscular disorder (HCC)    PAD (peripheral artery disease)    Phlebitis    Positive TB test    Seasonal allergies    Shortness of breath    Spinal disease      Social History   Tobacco Use   Smoking status: Every Day    Current packs/day: 0.00    Average packs/day: 0.5 packs/day for 49.0 years (24.5 ttl pk-yrs)    Types: Cigarettes    Start date: 02/10/1972    Last attempt to quit: 02/09/2021    Years since quitting: 2.8    Passive exposure: Current (Wife is a Smoker)   Smokeless tobacco: Never   Tobacco comments:    Half a pack a day 12/16/2023 KRD  Vaping Use   Vaping status: Never Used   Substance Use Topics   Alcohol  use: Not Currently   Drug use: No    Mr.Printz reports that he has been smoking cigarettes. He started smoking about 51 years ago. He has a 24.5 pack-year smoking history. He has been exposed to tobacco smoke. He has never used smokeless tobacco. He reports that he does not currently use alcohol . He reports that he does not use drugs.  Tobacco Cessation: Ready to quit: Not Answered Counseling given: Not Answered Tobacco comments: Half a pack a day 12/16/2023 KRD Current every day smoker   Past surgical hx, Family hx, Social hx all reviewed.  Current Outpatient Medications on File Prior to Visit  Medication Sig   albuterol  (PROAIR  HFA) 108 (90 Base) MCG/ACT inhaler Inhale 1-2 puffs into the lungs every 6 (six) hours as needed for wheezing or shortness of breath.   albuterol  (PROVENTIL ) (2.5 MG/3ML) 0.083% nebulizer solution Take 3 mLs (2.5 mg total) by nebulization every 6 (six) hours as needed for wheezing or shortness of breath.   aspirin  EC 81 MG tablet Take 81 mg by mouth daily.   carvedilol  (COREG ) 6.25 MG tablet TAKE 1 TABLET BY MOUTH TWICE  DAILY   Cholecalciferol (VITAMIN D) 50 MCG (2000 UT) tablet Take 2,000 Units by mouth daily.   colchicine  0.6 MG tablet TAKE 2 TABLETS BY MOUTH AS  DIRECTED THEN 1 TABLET  AFTER 1 HOUR. CAN TAKE 1  TABLET TWICE DAILY  THEREAFTER.   doxycycline  (VIBRA -TABS) 100 MG tablet Take 1 tablet (100 mg total) by mouth 2 (two) times daily.   EPINEPHrine  (PRIMATENE  MIST) 0.125 MG/ACT AERO Inhale 1 puff into the lungs daily as needed (COPD flare).   ezetimibe  (ZETIA ) 10 MG tablet TAKE 1 TABLET BY MOUTH DAILY   Fluticasone -Umeclidin-Vilant (TRELEGY ELLIPTA ) 200-62.5-25 MCG/ACT AEPB Inhale 1 puff into the lungs every evening.   Fluticasone -Umeclidin-Vilant (TRELEGY ELLIPTA ) 200-62.5-25 MCG/ACT AEPB Inhale 1 puff into the lungs daily at 2 PM.   furosemide  (LASIX ) 20 MG tablet Take 1 tablet (20 mg total) by mouth daily.    gabapentin  (NEURONTIN ) 600 MG tablet Take 600 mg by mouth 3 (three) times daily.   HYDROcodone -acetaminophen  (NORCO) 5-325 MG tablet Take 1 tablet by mouth every 6 (six) hours as needed for moderate pain.   olmesartan  (  BENICAR ) 40 MG tablet TAKE 1 TABLET BY MOUTH DAILY   omeprazole  (PRILOSEC) 40 MG capsule Take 40 mg by mouth daily.   pantoprazole  (PROTONIX ) 40 MG tablet Take 1 tablet (40 mg total) by mouth daily.   predniSONE  (DELTASONE ) 10 MG tablet Prednisone  taper; 10 mg tablets: 4 tabs x 2 days, 3 tabs x 2 days, 2 tabs x 2 days 1 tab x 2 days then stop.   rosuvastatin  (CRESTOR ) 40 MG tablet TAKE 1 TABLET BY MOUTH DAILY   traZODone  (DESYREL ) 100 MG tablet Take 1 tablet (100 mg total) by mouth at bedtime as needed for sleep.   No current facility-administered medications on file prior to visit.     Allergies  Allergen Reactions   Zestril  [Lisinopril ] Hives and Itching   Contrast Media [Iodinated Contrast Media] Hives and Itching    OK if pt has Benadryl  pre-med.   Elavil  [Amitriptyline ] Nausea Only and Other (See Comments)    Makes pt feel weird   Gadolinium Derivatives Hives and Itching   Motrin [Ibuprofen] Other (See Comments)    Internal bleeding    Review Of Systems:  Constitutional:   No  weight loss, night sweats,  Fevers, chills, fatigue, or  lassitude.  HEENT:   No headaches,  Difficulty swallowing,  Tooth/dental problems, or  Sore throat,                No sneezing, itching, ear ache, nasal congestion, post nasal drip,   CV:  No chest pain,  Orthopnea, PND, swelling in lower extremities, anasarca, dizziness, palpitations, syncope.   GI  No heartburn, indigestion, abdominal pain, nausea, vomiting, diarrhea, change in bowel habits, loss of appetite, bloody stools.   Resp: + shortness of breath with exertion or at rest.  No excess mucus, no productive cough,  No non-productive cough,  No coughing up of blood.  No change in color of mucus.  No wheezing.  No chest wall  deformity  Skin: no rash or lesions.  GU: no dysuria, change in color of urine, no urgency or frequency.  No flank pain, no hematuria   MS:  No joint pain or swelling.  No decreased range of motion.  No back pain.  Psych:  No change in mood or affect. No depression or anxiety.  No memory loss.   Vital Signs BP (!) 186/82   Pulse (!) 51   Temp 98.1 F (36.7 C) (Oral)   Ht 5' 9 (1.753 m)   Wt 232 lb 12.8 oz (105.6 kg)   SpO2 97%   BMI 34.38 kg/m    Physical Exam:  General- No distress,  A&Ox3, pleasant ENT: No sinus tenderness, TM clear, pale nasal mucosa, no oral exudate,no post nasal drip, no LAN Cardiac: S1, S2, regular rate and rhythm, no murmur Chest: No wheeze/ rales/ dullness; no accessory muscle use, no nasal flaring, no sternal retractions Abd.: Soft Non-tender, ND, BS +, Body mass index is 34.38 kg/m.  Ext: No clubbing cyanosis, edema, no obvious deformities Neuro:  normal strength, MAE x 4, A&O x 3 Skin: No rashes, warm and dry, no obvious skin lesions  Psych: normal mood and behavior   Assessment/Plan  Assessment and Plan Assessment & Plan Stable pulmonary nodule under surveillance after lung cancer treated with radiation therapy CT scan shows stable 5 x 3 x 2.9 cm mass, likely radiation changes. Recurrent tumor not excluded, but previous biopsy benign. - Continue surveillance with follow-up interval extended to six months. - Order follow-up CT  scan in six months to monitor nodule stability. -Follow up in the office 1-2 weeks after the scan to review the results - Consider PET scan if significant growth occurs. - Instruct to return immediately if experiencing unintentional weight loss or hemoptysis.  Chronic obstructive pulmonary disease (COPD) Managed with Trelegy and albuterol . Discussed Breztri as an alternative due to financial concerns with Trelegy. - Provide Trelegy samples. - Instruct to verify insurance coverage and copay for Trelegy. - Consider  Breztri as an alternative if Trelegy is unaffordable. - Continue albuterol  as needed.  AVS 12/16/2023 Your CT chest shows the nodule of concern is stable. We will do a 6 month follow up scan to maintain surveillance for stability. This will be due 05/2024. If you have any unintentional weight loss , or any blood in your sputum, call to be seen right away.  Call if you need us  sooner.  We will give you some Trelegy 200 samples.  Take 1 puff once daily. Rinse mouth after use. Note your daily symptoms > remember red flags for COPD:  Increase in cough, increase in sputum production, increase in shortness of breath or activity intolerance. If you notice these symptoms, please call to be seen.    Please contact office for sooner follow up if symptoms do not improve or worsen or seek emergency care    I spent 20 minutes dedicated to the care of this patient on the date of this encounter to include pre-visit review of records, face-to-face time with the patient discussing conditions above, post visit ordering of testing, clinical documentation with the electronic health record, making appropriate referrals as documented, and communicating necessary information to the patient's healthcare team.      Lauraine JULIANNA Lites, NP 12/16/2023  4:16 PM

## 2023-12-24 ENCOUNTER — Other Ambulatory Visit: Payer: Self-pay | Admitting: Cardiovascular Disease

## 2024-01-26 ENCOUNTER — Other Ambulatory Visit: Payer: Self-pay | Admitting: Cardiovascular Disease

## 2024-01-30 ENCOUNTER — Other Ambulatory Visit: Payer: Self-pay | Admitting: *Deleted

## 2024-01-30 ENCOUNTER — Telehealth: Payer: Self-pay | Admitting: *Deleted

## 2024-01-30 MED ORDER — TRELEGY ELLIPTA 200-62.5-25 MCG/ACT IN AEPB: 1.0000 | INHALATION_SPRAY | Freq: Every day | RESPIRATORY_TRACT | Status: AC

## 2024-01-30 NOTE — Telephone Encounter (Signed)
 Two samples of Trelegy 200 left up front for patient to pick up.

## 2024-01-30 NOTE — Telephone Encounter (Signed)
 Copied from CRM #[id number]. Topic: Appointments - Appointment Scheduling >> Jan 29, 2024  1:00 PM Rilla B wrote: Patient calling to schedule appt in March. Patient states the last time he was in, he was told he could have a couple samples of Trilogy and none was available.  Is the patient able to pick up samples?  Please call patient @ [phone] (okay to leave message if they don't answer)  ATC patient x1.  Left detailed message letting him know I was leaving samples up front for him.

## 2024-02-04 ENCOUNTER — Encounter: Payer: Self-pay | Admitting: Internal Medicine

## 2024-04-08 ENCOUNTER — Encounter: Payer: Self-pay | Admitting: Internal Medicine

## 2024-04-08 ENCOUNTER — Other Ambulatory Visit

## 2024-04-08 ENCOUNTER — Ambulatory Visit: Admitting: Internal Medicine

## 2024-04-08 ENCOUNTER — Ambulatory Visit: Payer: Self-pay | Admitting: Internal Medicine

## 2024-04-08 VITALS — BP 126/88 | HR 52 | Ht 69.0 in | Wt 225.0 lb

## 2024-04-08 DIAGNOSIS — K219 Gastro-esophageal reflux disease without esophagitis: Secondary | ICD-10-CM | POA: Diagnosis not present

## 2024-04-08 DIAGNOSIS — R197 Diarrhea, unspecified: Secondary | ICD-10-CM

## 2024-04-08 DIAGNOSIS — F17210 Nicotine dependence, cigarettes, uncomplicated: Secondary | ICD-10-CM

## 2024-04-08 DIAGNOSIS — G8929 Other chronic pain: Secondary | ICD-10-CM

## 2024-04-08 DIAGNOSIS — Z8711 Personal history of peptic ulcer disease: Secondary | ICD-10-CM | POA: Diagnosis not present

## 2024-04-08 DIAGNOSIS — Z85118 Personal history of other malignant neoplasm of bronchus and lung: Secondary | ICD-10-CM

## 2024-04-08 DIAGNOSIS — R1030 Lower abdominal pain, unspecified: Secondary | ICD-10-CM

## 2024-04-08 LAB — BASIC METABOLIC PANEL WITH GFR
BUN: 26 mg/dL — ABNORMAL HIGH (ref 6–23)
CO2: 29 meq/L (ref 19–32)
Calcium: 8.7 mg/dL (ref 8.4–10.5)
Chloride: 100 meq/L (ref 96–112)
Creatinine, Ser: 2.2 mg/dL — ABNORMAL HIGH (ref 0.40–1.50)
GFR: 30.11 mL/min — ABNORMAL LOW
Glucose, Bld: 91 mg/dL (ref 70–99)
Potassium: 5.6 meq/L — ABNORMAL HIGH (ref 3.5–5.1)
Sodium: 137 meq/L (ref 135–145)

## 2024-04-08 MED ORDER — DIPHENHYDRAMINE HCL 50 MG PO TABS: ORAL_TABLET | ORAL | 0 refills | Status: AC

## 2024-04-08 MED ORDER — PREDNISONE 50 MG PO TABS: ORAL_TABLET | ORAL | 0 refills | Status: AC

## 2024-04-08 NOTE — Progress Notes (Signed)
 HISTORY OF PRESENT ILLNESS:  Joshua [name] is a 69 y.o. male with innumerable significant medical problems as listed below including COPD with ongoing tobacco abuse, coronary artery disease with intervention, lung cancer, and incapacitating chronic back pain.  He presents himself today, along with his wife, regarding complaints of chronic lower abdominal pain.  Patient was last seen in this office August 2022 regarding melena and hematochezia.  He was admitted to the hospital and underwent colonoscopy and upper endoscopy November 21, 2020.  Colonoscopy revealed diminutive polyps and hemorrhoids.  Otherwise normal upper endoscopy revealed esophagitis and gastric ulcers.  Biopsies for H. pylori were negative.  He was treated with PPI.  Tells me that he has been having lower abdominal pain for about 1 year or more.  Tender to the touch.  He does think that this is exacerbated by meals.  He does describe occasional postprandial urgency with loose stools.  No bleeding.  His weight fluctuates.  He was noted to have gallstones on imaging.  He was told that his problem is gallstones and needs his gallbladder out.  He does have chronic GERD.  He takes daily pantoprazole  to control symptoms.  No dysphagia.  Ultrasound from 2021 shows gallstones. Blood work from Aug 12, 2023 shows renal insufficiency with creatinine 1.65 CBC reveals normal hemoglobin 14.6.  Low platelets at 144,000  REVIEW OF SYSTEMS:  All non-GI ROS negative except for back pain, arthritis, shortness of breath, wheezing, coughing  Past Medical History:  Diagnosis Date   AC (acromioclavicular) joint bone spurs, unspecified laterality 11/25/2016   Arthritis    back (05/16/2015)   CAD in native artery, occluded Lcx and rotational atherectomy to LAD with DES 05/17/15 05/18/2015   Cavitary lesion of lung 02/25/2018   Chronic back pain    all my back   Chronic bronchitis (HCC)    COPD (chronic obstructive pulmonary disease) (HCC)     Diabetes mellitus without complication (HCC)    pt states he is no longer diabetic, no medications   GERD (gastroesophageal reflux disease)    H/O blood clots    s/p IVC filter ~ 2010: per IR 12/17/16, would not recommended retrieval unless filterl causes new concerns before one of my neck ORs   History of gout    Hyperlipidemia    Hypertension    Lung cancer (HCC) 02/25/2018   bronchoscopy, RUL + non-small cell carcinoma   Neuromuscular disorder (HCC)    PAD (peripheral artery disease)    Phlebitis    Positive TB test    Seasonal allergies    Shortness of breath    Spinal disease     Past Surgical History:  Procedure Laterality Date   ABDOMINAL AORTOGRAM W/LOWER EXTREMITY  01/07/2018   ABDOMINAL AORTOGRAM W/LOWER EXTREMITY N/A 01/07/2018   Procedure: ABDOMINAL AORTOGRAM W/LOWER EXTREMITY;  Surgeon: Darron Deatrice DELENA, MD;  Location: [facility];  Service: Cardiovascular;  Laterality: N/A;   ABDOMINAL AORTOGRAM W/LOWER EXTREMITY N/A 12/27/2020   Procedure: ABDOMINAL AORTOGRAM W/LOWER EXTREMITY;  Surgeon: Darron Deatrice DELENA, MD;  Location: [facility];  Service: Cardiovascular;  Laterality: N/A;   ABDOMINAL AORTOGRAM W/LOWER EXTREMITY N/A 04/03/2021   Procedure: ABDOMINAL AORTOGRAM W/LOWER EXTREMITY;  Surgeon: Serene Gaile ORN, MD;  Location: [facility];  Service: Cardiovascular;  Laterality: N/A;   ABDOMINAL AORTOGRAM W/LOWER EXTREMITY N/A 10/01/2022   Procedure: ABDOMINAL AORTOGRAM W/LOWER EXTREMITY;  Surgeon: Serene Gaile ORN, MD;  Location: [facility];  Service: Cardiovascular;  Laterality:  N/A;   ANTERIOR CERVICAL DECOMP/DISCECTOMY FUSION  ~ 2001-2009 X 3   ANTERIOR CERVICAL DECOMP/DISCECTOMY FUSION N/A 10/06/2012   Procedure: ANTERIOR CERVICAL DECOMPRESSION/DISCECTOMY FUSION 1 LEVEL;  Surgeon: Darina MALVA Boehringer, MD;  Location: [facility];  Service: Neurosurgery;  Laterality: N/A;  C7T1 anterior cervical decompression with fusion plating and bonegraft     APPLICATION OF WOUND VAC Left 11/08/2022   Procedure: APPLICATION OF WOUND VAC;  Surgeon: Serene Gaile ORN, MD;  Location: [facility];  Service: Vascular;  Laterality: Left;   BACK SURGERY     BIOPSY  11/21/2020   Procedure: BIOPSY;  Surgeon: Shila Gustav GAILS, MD;  Location: [facility];  Service: Endoscopy;;   BRONCHIAL BIOPSY  08/12/2023   Procedure: BRONCHOSCOPY, WITH BIOPSY;  Surgeon: Shelah Lamar RAMAN, MD;  Location: [facility] ENDOSCOPY;  Service: Pulmonary;;   BRONCHIAL BRUSHINGS  08/12/2023   Procedure: BRONCHOSCOPY, WITH BRUSH BIOPSY;  Surgeon: Shelah Lamar RAMAN, MD;  Location: [facility];  Service: Pulmonary;;   BRONCHIAL NEEDLE ASPIRATION BIOPSY  08/12/2023   Procedure: BRONCHOSCOPY, WITH NEEDLE ASPIRATION BIOPSY;  Surgeon: Shelah Lamar RAMAN, MD;  [facility]: [facility];  Service: Pulmonary;;   BRONCHOSCOPY, WITH BIOPSY USING ELECTROMAGNETIC NAVIGATION Right 08/12/2023   Procedure: BRONCHOSCOPY, WITH BIOPSY USING ELECTROMAGNETIC NAVIGATION;  Surgeon: Shelah Lamar RAMAN, MD;  [facility]: [facility];  Service: Pulmonary;  Laterality: Right;  WITH FLOURO   CARDIAC CATHETERIZATION  05/16/2015   CARDIAC CATHETERIZATION N/A 05/16/2015   Procedure: Left Heart Cath and Coronary Angiography;  Surgeon: Alm ORN Clay, MD;  Location: [facility] INVASIVE CV LAB;  Service: Cardiovascular;  Laterality: N/A;   CARDIAC CATHETERIZATION N/A 05/16/2015   Procedure: Coronary Balloon Angioplasty;  Surgeon: Alm ORN Clay, MD;  Location: [facility] INVASIVE CV LAB;  Service: Cardiovascular;  Laterality: N/A;   CARDIAC CATHETERIZATION N/A 05/17/2015   Procedure: Coronary Stent Intervention Rotoblater;  Surgeon: Peter M Jordan, MD;  Location: [facility] INVASIVE CV LAB;  Service: Cardiovascular;  Laterality: N/A;   COLONOSCOPY WITH PROPOFOL  N/A 11/21/2020   Procedure: COLONOSCOPY WITH PROPOFOL ;  Surgeon: Shila Gustav GAILS, MD;  Location: [facility];  Service: Endoscopy;  Laterality: N/A;   ELBOW SURGERY Left 2016   had bone pieces removed    ENDARTERECTOMY Left 11/08/2022   Procedure: LEFT ILIOFEMORAL ENDARTERECTOMY;  Surgeon: Serene Gaile ORN, MD;  Location: [facility] OR;  Service: Vascular;  Laterality: Left;   ENDARTERECTOMY FEMORAL Right 12/28/2020   Procedure: RIGHT FEMORAL ARTERY ENDARTERECTOMY;  Surgeon: Serene Gaile ORN, MD;  Location: [facility] OR;  Service: Vascular;  Laterality: Right;   ESOPHAGEAL MANOMETRY N/A 12/30/2016   Procedure: ESOPHAGEAL MANOMETRY (EM);  Surgeon: Abran Norleen SAILOR, MD;  Location: [facility];  Service: Endoscopy;  Laterality: N/A;   ESOPHAGOGASTRODUODENOSCOPY (EGD) WITH PROPOFOL  N/A 11/21/2020   Procedure: ESOPHAGOGASTRODUODENOSCOPY (EGD) WITH PROPOFOL ;  Surgeon: Shila Gustav GAILS, MD;  Location: [facility];  Service: Endoscopy;  Laterality: N/A;   EXCISIONAL HEMORRHOIDECTOMY     FEMORAL BYPASS  ~ 2010   FEMORAL-POPLITEAL BYPASS GRAFT Right 04/04/2021   Procedure: RIGHT FEMORAL-POPLITEAL ARTERY BYPASS GRAFT;  Surgeon: Serene Gaile ORN, MD;  [facility];  Service: Vascular;  Laterality: Right;   IR RADIOLOGIST EVAL & MGMT  12/17/2016   IVC FILTER PLACEMENT ([facility] HX)  2010   had blood clots in my back   NECK SURGERY  2014   2008-2014   [facility] ANGIOPLASTY Right 12/28/2020   Procedure: PATCH ANGIOPLASTY USING;  Surgeon: Serene Gaile ORN, MD;  Location: [facility] OR;  Service: Vascular;  Laterality: Right;  PATCH ANGIOPLASTY Left 11/08/2022   Procedure: LEFT FEMORAL ARTERY PATCH ANGIOPLASTY USING HEMASHEILD PLATINUM DACRON PATCH 0.8CM X 15.2CM;  Surgeon: Serene Gaile ORN, MD;  Location: [facility] OR;  Service: Vascular;  Laterality: Left;   PERIPHERAL VASCULAR ATHERECTOMY  01/07/2018   Procedure: PERIPHERAL VASCULAR ATHERECTOMY;  Surgeon: Darron Deatrice LABOR, MD;  [facility];  Service: Cardiovascular;;   PERIPHERAL VASCULAR BALLOON ANGIOPLASTY  01/07/2018   Procedure: PERIPHERAL VASCULAR BALLOON ANGIOPLASTY;  Surgeon: Darron Deatrice LABOR, MD;  [facility];  Service: Cardiovascular;;    PERIPHERAL VASCULAR CATHETERIZATION N/A 02/14/2016   Procedure: Abdominal Aortogram w/Lower Extremity;  Surgeon: Deatrice LABOR Darron, MD;  [facility];  Service: Cardiovascular;  Laterality: N/A;   PERIPHERAL VASCULAR INTERVENTION Right 04/03/2021   Procedure: PERIPHERAL VASCULAR INTERVENTION;  Surgeon: Serene Gaile ORN, MD;  [facility];  Service: Cardiovascular;  Laterality: Right;   POLYPECTOMY  11/21/2020   Procedure: POLYPECTOMY;  Surgeon: Shila Gustav GAILS, MD;  Location: [facility];  Service: Endoscopy;;   POSTERIOR LAMINECTOMY / DECOMPRESSION LUMBAR SPINE  2006   bone spurs   THROMBECTOMY FEMORAL ARTERY Left 11/08/2022   Procedure: THROMBECTOMY SUPERFICIAL FEMORAL ARTERY;  Surgeon: Serene Gaile ORN, MD;  [facility];  Service: Vascular;  Laterality: Left;   THROMBECTOMY ILIAC ARTERY N/A 11/08/2022   Procedure: THROMBECTOMY OF AORTA BIFEMORAL GRAFT;  Surgeon: Serene Gaile ORN, MD;  [facility];  Service: Vascular;  Laterality: N/A;   VIDEO BRONCHOSCOPY WITH ENDOBRONCHIAL NAVIGATION Right 02/25/2018   Procedure: VIDEO BRONCHOSCOPY WITH ENDOBRONCHIAL NAVIGATION;  Surgeon: Brenna Adine CROME, DO;  Location: [facility];  Service: Thoracic;  Laterality: Right;   VIDEO BRONCHOSCOPY WITH ENDOBRONCHIAL ULTRASOUND Right 02/25/2018   Procedure: VIDEO BRONCHOSCOPY WITH ENDOBRONCHIAL ULTRASOUND;  Surgeon: Brenna Adine CROME, DO;  Location: [facility] OR;  Service: Thoracic;  Laterality: Right;    Social History [place]  reports that he has been smoking cigarettes. He started smoking about 52 years ago. He has a 24.5 pack-year smoking history. He has been exposed to tobacco smoke. He has never used smokeless tobacco. He reports that he does not currently use alcohol . He reports that he does not use drugs.  family history includes Aneurysm in his brother; Healthy in his daughter and maternal grandmother; Heart attack in his father; Hypertension in his father and  mother; Leukemia in his grandchild; Stomach cancer in his mother; Stroke in his father.  Allergies[1]     PHYSICAL EXAMINATION: Vital signs: BP 126/88   Pulse (!) 52   Ht 5' 9 (1.753 m)   Wt 225 lb (102.1 kg)   SpO2 98%   BMI 33.23 kg/m   Constitutional: Unhealthy, chronically ill-appearing, no acute distress Psychiatric: alert and oriented x3, cooperative Eyes: extraocular movements intact, anicteric, conjunctiva pink Mouth: oral pharynx moist, no lesions Neck: supple no lymphadenopathy Cardiovascular: heart regular rate and rhythm, no murmur Lungs: Moving air well but expiratory wheezes throughout Abdomen: soft, TENDERNESS ALL OVER THE LOWER ABDOMEN ON THE ABDOMINAL WALL, nondistended, no obvious ascites, no peritoneal signs, normal bowel sounds, no organomegaly Rectal: Omitted Extremities: no clubbing, cyanosis, or lower extremity edema bilaterally Skin: no lesions on visible extremities Neuro: No focal deficits.  Cranial nerves intact  ASSESSMENT:  1.  Chronic lower abdominal pain.  Patient states that the lower abdominal discomfort on palpation today reproduces his pain.  This is clearly musculoskeletal.  I suspect that the patient's pain is secondary to the chronic recruitment of  his abdominal musculature to assist with breathing.  He should have more recent imaging though to rule out other possibilities.  He does have a history of lung cancer 2.  Postprandial discomfort with loose stools 3.  Colonoscopy 2022 as described 4.  History of ulcers.  EGD 2022 as described 5.  GERD.  Needs PPI to control symptoms.  On PPI 6.  Multiple significant medical problems including advanced lung disease   PLAN:  1.  Continue PPI 2.  Stop smoking 3.  Schedule CT scan of the abdomen pelvis to further evaluate pain 4.  Additional recommendations pending the above. 5.  Ongoing general medical care with PCP A total time of 65 minutes was spent preparing to see the patient, obtaining  comprehensive history, performing medically appropriate physical examination, counseling and educating the patient and his wife regarding the above listed issues, ordering advanced radiology study, and documenting clinical information in the health record         [1]  Allergies Allergen Reactions   Zestril  [Lisinopril ] Hives and Itching   Contrast Media [Iodinated Contrast Media] Hives and Itching    OK if pt has Benadryl  pre-med.   Elavil  [Amitriptyline ] Nausea Only and Other (See Comments)    Makes pt feel weird   Gadolinium Derivatives Hives and Itching   Motrin [Ibuprofen] Other (See Comments)    Internal bleeding

## 2024-04-08 NOTE — Patient Instructions (Signed)
 Your provider has requested that you go to the basement level for lab work before leaving today. Press B on the elevator. The lab is located at the first door on the left as you exit the elevator.  You have been scheduled for a CT scan of the abdomen and pelvis at [facility], 1st floor Radiology. You are scheduled on 04/15/2024 at 3:30pm. You should arrive 15 minutes prior to your appointment time for registration.   You may take any medications as prescribed with a small amount of water, if necessary. If you take any of the following medications: METFORMIN, GLUCOPHAGE, GLUCOVANCE, AVANDAMET, RIOMET, FORTAMET, ACTOPLUS MET, JANUMET, GLUMETZA or METAGLIP, you MAY be asked to HOLD this medication 48 hours AFTER the exam.    If you have any questions regarding your exam or if you need to reschedule, you may call [facility] at [phone] between the hours of 8:00 am and 5:00 pm, Monday-Friday.   Since you are allergic to one or more components in IV contrast, we have sent a prescription for (3) 50 mg tablets of Prednisone  to your pharmacy as a Pre-Medication Prep for your upcoming procedure requiring contrast.    Take (1) 50 mg tablet of prednisone  13 hours prior to your procedure at 2:30am.   Take (1) 50 mg tablet 7 hours prior to your procedure at 8:30am.    Take (1) 50 mg tablet 1 hour prior to your procedure at 2:30pm.    You also need to take 50 mg of Benadryl  1 hour prior to your procedure at 2:30pm.  If you have 25 mg tablets of Benadryl , which can be purchased over the counter, you may take 2 tablets.  Otherwise we can send a prescription for (1) 50 mg tablet of Benadryl  to your pharmacy.     _______________________________________________________  If your blood pressure at your visit was 140/90 or greater, please contact your primary care physician to follow up on this.  _______________________________________________________  If you are age 69 or older, your body  mass index should be between 23-30. Your Body mass index is 33.23 kg/m. If this is out of the aforementioned range listed, please consider follow up with your Primary Care Provider.  If you are age 43 or younger, your body mass index should be between 19-25. Your Body mass index is 33.23 kg/m. If this is out of the aformentioned range listed, please consider follow up with your Primary Care Provider.   ________________________________________________________  The [facility] providers would like to encourage you to use MYCHART to communicate with providers for non-urgent requests or questions.  Due to long hold times on the telephone, sending your provider a message by [facility] may be a faster and more efficient way to get a response.  Please allow 48 business hours for a response.  Please remember that this is for non-urgent requests.  _______________________________________________________  [facility] is using a team-based approach to care.  Your team is made up of your doctor and two to three APPS. Our APPS (Nurse Practitioners and Physician Assistants) work with your physician to ensure care continuity for you. They are fully qualified to address your health concerns and develop a treatment plan. They communicate directly with your gastroenterologist to care for you. Seeing the Advanced Practice Practitioners on your physician's team can help you by facilitating care more promptly, often allowing for earlier appointments, access to diagnostic testing, procedures, and other specialty referrals.

## 2024-04-09 NOTE — Telephone Encounter (Signed)
 Inbound call from patient wife stating that she is returning a call back to [place]. Patient wife is requesting a call a back. Please advise.

## 2024-04-15 ENCOUNTER — Ambulatory Visit (HOSPITAL_COMMUNITY)
Admission: RE | Admit: 2024-04-15 | Discharge: 2024-04-15 | Disposition: A | Discharge status: Home / Self Care | Source: Ambulatory Visit | Attending: Internal Medicine | Admitting: Internal Medicine

## 2024-04-15 DIAGNOSIS — K219 Gastro-esophageal reflux disease without esophagitis: Secondary | ICD-10-CM | POA: Diagnosis present

## 2024-04-15 DIAGNOSIS — R1030 Lower abdominal pain, unspecified: Secondary | ICD-10-CM | POA: Insufficient documentation

## 2024-04-15 LAB — POCT I-STAT CREATININE: Creatinine, Ser: 2 mg/dL — ABNORMAL HIGH (ref 0.61–1.24)

## 2024-04-15 MED ORDER — IOHEXOL 300 MG/ML  SOLN
80.0000 mL | Freq: Once | INTRAMUSCULAR | Status: AC | PRN
  Administered 2024-04-15: 80 mL via INTRAVENOUS

## 2024-04-20 ENCOUNTER — Ambulatory Visit: Admitting: Cardiovascular Disease
